# Patient Record
Sex: Male | Born: 1940 | Race: White | Hispanic: No | Marital: Married | Smoking: Former smoker
Health system: Southern US, Community
[De-identification: ages and names within clinical notes are randomized; demographics above are authoritative.]

## PROBLEM LIST (undated history)

## (undated) DIAGNOSIS — I739 Peripheral vascular disease, unspecified: Secondary | ICD-10-CM

## (undated) DIAGNOSIS — I499 Cardiac arrhythmia, unspecified: Secondary | ICD-10-CM

## (undated) DIAGNOSIS — I509 Heart failure, unspecified: Secondary | ICD-10-CM

## (undated) DIAGNOSIS — E785 Hyperlipidemia, unspecified: Secondary | ICD-10-CM

## (undated) DIAGNOSIS — R1310 Dysphagia, unspecified: Secondary | ICD-10-CM

## (undated) DIAGNOSIS — J9600 Acute respiratory failure, unspecified whether with hypoxia or hypercapnia: Secondary | ICD-10-CM

## (undated) DIAGNOSIS — Z8679 Personal history of other diseases of the circulatory system: Secondary | ICD-10-CM

## (undated) DIAGNOSIS — E039 Hypothyroidism, unspecified: Secondary | ICD-10-CM

## (undated) DIAGNOSIS — G4730 Sleep apnea, unspecified: Secondary | ICD-10-CM

## (undated) DIAGNOSIS — Z6841 Body Mass Index (BMI) 40.0 and over, adult: Secondary | ICD-10-CM

## (undated) DIAGNOSIS — R251 Tremor, unspecified: Secondary | ICD-10-CM

## (undated) DIAGNOSIS — E119 Type 2 diabetes mellitus without complications: Secondary | ICD-10-CM

## (undated) DIAGNOSIS — I429 Cardiomyopathy, unspecified: Secondary | ICD-10-CM

## (undated) DIAGNOSIS — Z87448 Personal history of other diseases of urinary system: Secondary | ICD-10-CM

## (undated) DIAGNOSIS — Z87442 Personal history of urinary calculi: Secondary | ICD-10-CM

## (undated) DIAGNOSIS — K219 Gastro-esophageal reflux disease without esophagitis: Secondary | ICD-10-CM

## (undated) DIAGNOSIS — I10 Essential (primary) hypertension: Secondary | ICD-10-CM

## (undated) DIAGNOSIS — G8191 Hemiplegia, unspecified affecting right dominant side: Secondary | ICD-10-CM

## (undated) DIAGNOSIS — G9340 Encephalopathy, unspecified: Secondary | ICD-10-CM

## (undated) DIAGNOSIS — I639 Cerebral infarction, unspecified: Secondary | ICD-10-CM

## (undated) DIAGNOSIS — E559 Vitamin D deficiency, unspecified: Secondary | ICD-10-CM

## (undated) HISTORY — DX: Essential (primary) hypertension: I10

## (undated) HISTORY — DX: Hyperlipidemia, unspecified: E78.5

## (undated) HISTORY — PX: COLONOSCOPY: SHX174

## (undated) HISTORY — PX: TONSILLECTOMY: SUR1361

## (undated) HISTORY — DX: Cerebral infarction, unspecified: I63.9

## (undated) HISTORY — DX: Type 2 diabetes mellitus without complications: E11.9

## (undated) HISTORY — PX: CATARACT EXTRACTION, BILATERAL: SHX1313

---
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
## 2004-08-02 ENCOUNTER — Ambulatory Visit: Payer: Self-pay | Admitting: Internal Medicine

## 2004-08-03 ENCOUNTER — Ambulatory Visit: Payer: Self-pay | Admitting: Internal Medicine

## 2004-11-30 ENCOUNTER — Ambulatory Visit: Payer: Self-pay | Admitting: Urology

## 2006-02-21 ENCOUNTER — Ambulatory Visit: Payer: Self-pay | Admitting: Unknown Physician Specialty

## 2007-09-16 ENCOUNTER — Ambulatory Visit: Payer: Self-pay | Admitting: Internal Medicine

## 2007-10-23 ENCOUNTER — Ambulatory Visit: Payer: Self-pay

## 2008-04-18 ENCOUNTER — Other Ambulatory Visit: Payer: Self-pay

## 2008-04-19 ENCOUNTER — Observation Stay: Payer: Self-pay | Admitting: Internal Medicine

## 2009-09-03 DIAGNOSIS — I639 Cerebral infarction, unspecified: Secondary | ICD-10-CM

## 2009-09-03 HISTORY — DX: Cerebral infarction, unspecified: I63.9

## 2011-04-30 ENCOUNTER — Ambulatory Visit: Payer: Self-pay | Admitting: Unknown Physician Specialty

## 2011-05-02 LAB — PATHOLOGY REPORT

## 2013-03-07 ENCOUNTER — Emergency Department: Payer: Self-pay | Admitting: Emergency Medicine

## 2013-03-07 LAB — COMPREHENSIVE METABOLIC PANEL
Albumin: 3.9 g/dL (ref 3.4–5.0)
Anion Gap: 10 (ref 7–16)
BUN: 18 mg/dL (ref 7–18)
Bilirubin,Total: 0.3 mg/dL (ref 0.2–1.0)
Calcium, Total: 9.4 mg/dL (ref 8.5–10.1)
Co2: 26 mmol/L (ref 21–32)
Creatinine: 1.06 mg/dL (ref 0.60–1.30)
EGFR (African American): >60
EGFR (Non-African Amer.): >60
Potassium: 3.6 mmol/L (ref 3.5–5.1)
SGOT(AST): 51 U/L — ABNORMAL HIGH (ref 15–37)
SGPT (ALT): 56 U/L (ref 12–78)
Sodium: 137 mmol/L (ref 136–145)

## 2013-03-07 LAB — URINALYSIS, COMPLETE
Bilirubin,UR: NEGATIVE
Ketone: NEGATIVE
Ph: 6 (ref 4.5–8.0)
RBC,UR: 1 /HPF (ref 0–5)
Squamous Epithelial: <1

## 2013-03-07 LAB — PROTIME-INR
INR: 1.1
Prothrombin Time: 14.1 secs (ref 11.5–14.7)

## 2013-03-07 LAB — CBC
HCT: 41.5 % (ref 40.0–52.0)
HGB: 14.2 g/dL (ref 13.0–18.0)
Platelet: 212 10*3/uL (ref 150–440)
RBC: 4.21 10*6/uL — ABNORMAL LOW (ref 4.40–5.90)
WBC: 8.4 10*3/uL (ref 3.8–10.6)

## 2014-03-24 ENCOUNTER — Ambulatory Visit: Payer: Self-pay | Admitting: Internal Medicine

## 2015-05-13 ENCOUNTER — Other Ambulatory Visit: Payer: Self-pay | Admitting: Internal Medicine

## 2015-05-13 DIAGNOSIS — I714 Abdominal aortic aneurysm, without rupture, unspecified: Secondary | ICD-10-CM

## 2015-05-17 ENCOUNTER — Ambulatory Visit
Admission: RE | Admit: 2015-05-17 | Discharge: 2015-05-17 | Disposition: A | Discharge status: Home / Self Care | Payer: Medicare Other | Source: Ambulatory Visit | Attending: Internal Medicine | Admitting: Internal Medicine

## 2015-05-17 DIAGNOSIS — I714 Abdominal aortic aneurysm, without rupture, unspecified: Secondary | ICD-10-CM

## 2015-09-04 DIAGNOSIS — Z87442 Personal history of urinary calculi: Secondary | ICD-10-CM

## 2015-09-04 HISTORY — DX: Personal history of urinary calculi: Z87.442

## 2016-02-24 ENCOUNTER — Other Ambulatory Visit: Payer: Self-pay | Admitting: Internal Medicine

## 2016-02-24 DIAGNOSIS — I714 Abdominal aortic aneurysm, without rupture, unspecified: Secondary | ICD-10-CM

## 2016-02-28 ENCOUNTER — Ambulatory Visit
Admission: RE | Admit: 2016-02-28 | Discharge: 2016-02-28 | Disposition: A | Discharge status: Home / Self Care | Payer: Medicare Other | Source: Ambulatory Visit | Attending: Internal Medicine | Admitting: Internal Medicine

## 2016-02-28 DIAGNOSIS — I714 Abdominal aortic aneurysm, without rupture, unspecified: Secondary | ICD-10-CM

## 2016-02-28 DIAGNOSIS — K760 Fatty (change of) liver, not elsewhere classified: Secondary | ICD-10-CM | POA: Diagnosis not present

## 2016-02-28 DIAGNOSIS — N281 Cyst of kidney, acquired: Secondary | ICD-10-CM | POA: Insufficient documentation

## 2016-08-13 DIAGNOSIS — N132 Hydronephrosis with renal and ureteral calculous obstruction: Secondary | ICD-10-CM | POA: Insufficient documentation

## 2017-03-31 DIAGNOSIS — M79606 Pain in leg, unspecified: Secondary | ICD-10-CM | POA: Insufficient documentation

## 2017-03-31 DIAGNOSIS — I890 Lymphedema, not elsewhere classified: Secondary | ICD-10-CM | POA: Insufficient documentation

## 2017-03-31 DIAGNOSIS — I872 Venous insufficiency (chronic) (peripheral): Secondary | ICD-10-CM | POA: Insufficient documentation

## 2017-03-31 NOTE — Progress Notes (Signed)
MRN : [id number]  Darren Frazier is a 76 y.o. (10-Jan-1941) male who presents with chief complaint of No chief complaint on file. [name]  History of Present Illness: The patient returns to the office for followup evaluation regarding leg swelling.  The swelling has improved quite a bit and the pain associated with swelling has decreased substantially. There have not been any interval development of a ulcerations or wounds.  Since the previous visit the patient has been wearing graduated compression stockings and has noted little significant improvement in the lymphedema. The patient has been using compression routinely morning until night.  The patient also states elevation during the day and exercise is being done too.     No outpatient prescriptions have been marked as taking for the 04/01/17 encounter (Appointment) with [name], [name], MD.    No past medical history on file.  No past surgical history on file.  Social History Social History  Substance Use Topics  . Smoking status: Not on file  . Smokeless tobacco: Not on file  . Alcohol use Not on file    Family History No family history on file.  Allergies not on file   REVIEW OF SYSTEMS (Negative unless checked)  Constitutional: [] Weight loss  [] Fever  [] Chills Cardiac: [] Chest pain   [] Chest pressure   [] Palpitations   [] Shortness of breath when laying flat   [] Shortness of breath with exertion. Vascular:  [] Pain in legs with walking   [x] Pain in legs with standing  [] History of DVT   [] Phlebitis   [x] Swelling in legs   [] Varicose veins   [] Non-healing ulcers Pulmonary:   [] Uses home oxygen   [] Productive cough   [] Hemoptysis   [] Wheeze  [] COPD   [] Asthma Neurologic:  [] Dizziness   [] Seizures   [] History of stroke   [] History of TIA  [] Aphasia   [] Vissual changes   [] Weakness or numbness in arm   [] Weakness or numbness in leg Musculoskeletal:   [] Joint swelling   [] Joint pain   [] Low back pain Hematologic:   [] Easy bruising  [] Easy bleeding   [] Hypercoagulable state   [] Anemic Gastrointestinal:  [] Diarrhea   [] Vomiting  [] Gastroesophageal reflux/heartburn   [] Difficulty swallowing. Genitourinary:  [] Chronic kidney disease   [] Difficult urination  [] Frequent urination   [] Blood in urine Skin:  [x] Rashes   [] Ulcers  Psychological:  [] History of anxiety   []  History of major depression.  Physical Examination  There were no vitals filed for this visit. There is no height or weight on file to calculate BMI. Gen: WD/WN, NAD Head: [place]/AT, No temporalis wasting.  Ear/Nose/Throat: Hearing grossly intact, nares w/o erythema or drainage Eyes: PER, EOMI, sclera nonicteric.  Neck: Supple, no large masses.   Pulmonary:  Good air movement, no audible wheezing bilaterally, no use of accessory muscles.  Cardiac: RRR, no JVD Vascular: moderate to severe venous stasis changes to the legs bilaterally.  3+ soft pitting edema Vessel Right Left  Radial Palpable Palpable  PT Palpable Palpable  DP Palpable Palpable  Gastrointestinal: Non-distended. No guarding/no peritoneal signs.  Musculoskeletal: M/S 5/5 throughout.  No deformity or atrophy.  Neurologic: CN 2-12 intact. Symmetrical.  Speech is fluent. Motor exam as listed above. Psychiatric: Judgment intact, Mood & affect appropriate for pt's clinical situation. Dermatologic: Venous rashes no ulcers noted.  No changes consistent with cellulitis. Lymph : No lichenification or skin changes of chronic lymphedema.  CBC Lab Results  Component Value Date   WBC 8.4 03/07/2013   HGB 14.2 03/07/2013  HCT 41.5 03/07/2013   MCV 99 03/07/2013   PLT 212 03/07/2013    BMET    Component Value Date/Time   NA 137 03/07/2013 1849   K 3.6 03/07/2013 1849   CL 101 03/07/2013 1849   CO2 26 03/07/2013 1849   GLUCOSE 135 (H) 03/07/2013 1849   BUN 18 03/07/2013 1849   CREATININE 1.06 03/07/2013 1849   CALCIUM 9.4 03/07/2013 1849   GFRNONAA >60 03/07/2013 1849    GFRAA >60 03/07/2013 1849   CrCl cannot be calculated (Patient's most recent lab result is older than the maximum 21 days allowed.).  COAG Lab Results  Component Value Date   INR 1.1 03/07/2013    Radiology No results found.  Assessment/Plan 1. Chronic venous insufficiency  No surgery or intervention at this point in time.    I have reviewed my discussion with the patient regarding lymphedema and why it  causes symptoms.  Patient will continue wearing graduated compression stockings class 1 (20-30 mmHg) on a daily basis a prescription was given. The patient is reminded to put the stockings on first thing in the morning and removing them in the evening. The patient is instructed specifically not to sleep in the stockings.   In addition, behavioral modification throughout the day will be continued.  This will include frequent elevation (such as in a recliner), use of over the counter pain medications as needed and exercise such as walking.  I have reviewed systemic causes for chronic edema such as liver, kidney and cardiac etiologies and there does not appear to be any significant changes in these organ systems over the past year.  The patient is under the impression that these organ systems are all stable and unchanged.    The patient will continue aggressive use of the  lymph pump.  This will continue to improve the edema control and prevent sequela such as ulcers and infections.   The patient will follow-up with me on an annual basis.    2. Lymphedema Patient notes that his machine has a crack in the casing although it is working this may represent an Lobbyist hazard he also notes that his leggings are 70+ years old and somewhat worn. We will contact [facility] to see if replacements can be obtained.  3. Pain in both lower extremities See #1 and 2    Levora Dredge, MD  03/31/2017 4:44 PM

## 2017-04-01 ENCOUNTER — Encounter (INDEPENDENT_AMBULATORY_CARE_PROVIDER_SITE_OTHER): Payer: Self-pay | Admitting: Vascular Surgery

## 2017-04-01 ENCOUNTER — Ambulatory Visit (INDEPENDENT_AMBULATORY_CARE_PROVIDER_SITE_OTHER): Payer: Medicare Other | Admitting: Vascular Surgery

## 2017-04-01 DIAGNOSIS — I890 Lymphedema, not elsewhere classified: Secondary | ICD-10-CM | POA: Diagnosis not present

## 2017-04-01 DIAGNOSIS — M79604 Pain in right leg: Secondary | ICD-10-CM | POA: Diagnosis not present

## 2017-04-01 DIAGNOSIS — M79605 Pain in left leg: Secondary | ICD-10-CM

## 2017-04-01 DIAGNOSIS — I872 Venous insufficiency (chronic) (peripheral): Secondary | ICD-10-CM | POA: Diagnosis not present

## 2017-09-03 DIAGNOSIS — I739 Peripheral vascular disease, unspecified: Secondary | ICD-10-CM

## 2017-09-03 DIAGNOSIS — Z6841 Body Mass Index (BMI) 40.0 and over, adult: Secondary | ICD-10-CM

## 2017-09-03 HISTORY — DX: Body Mass Index (BMI) 40.0 and over, adult: Z684

## 2017-09-03 HISTORY — DX: Morbid (severe) obesity due to excess calories: E66.01

## 2017-09-03 HISTORY — DX: Peripheral vascular disease, unspecified: I73.9

## 2018-02-19 ENCOUNTER — Other Ambulatory Visit (INDEPENDENT_AMBULATORY_CARE_PROVIDER_SITE_OTHER): Payer: Self-pay | Admitting: Vascular Surgery

## 2018-02-19 DIAGNOSIS — L98499 Non-pressure chronic ulcer of skin of other sites with unspecified severity: Secondary | ICD-10-CM

## 2018-02-19 DIAGNOSIS — R0989 Other specified symptoms and signs involving the circulatory and respiratory systems: Secondary | ICD-10-CM

## 2018-02-20 ENCOUNTER — Encounter (INDEPENDENT_AMBULATORY_CARE_PROVIDER_SITE_OTHER): Payer: Self-pay | Admitting: Vascular Surgery

## 2018-02-20 ENCOUNTER — Ambulatory Visit (INDEPENDENT_AMBULATORY_CARE_PROVIDER_SITE_OTHER): Payer: Medicare HMO

## 2018-02-20 ENCOUNTER — Ambulatory Visit (INDEPENDENT_AMBULATORY_CARE_PROVIDER_SITE_OTHER): Payer: Medicare HMO | Admitting: Vascular Surgery

## 2018-02-20 ENCOUNTER — Encounter (INDEPENDENT_AMBULATORY_CARE_PROVIDER_SITE_OTHER): Payer: Self-pay

## 2018-02-20 ENCOUNTER — Other Ambulatory Visit (INDEPENDENT_AMBULATORY_CARE_PROVIDER_SITE_OTHER): Payer: Self-pay

## 2018-02-20 VITALS — BP 139/73 | HR 91 | Resp 16 | Ht 69.0 in | Wt 295.0 lb

## 2018-02-20 DIAGNOSIS — E1159 Type 2 diabetes mellitus with other circulatory complications: Secondary | ICD-10-CM | POA: Diagnosis not present

## 2018-02-20 DIAGNOSIS — I10 Essential (primary) hypertension: Secondary | ICD-10-CM | POA: Diagnosis not present

## 2018-02-20 DIAGNOSIS — L98499 Non-pressure chronic ulcer of skin of other sites with unspecified severity: Secondary | ICD-10-CM

## 2018-02-20 DIAGNOSIS — R0989 Other specified symptoms and signs involving the circulatory and respiratory systems: Secondary | ICD-10-CM

## 2018-02-20 DIAGNOSIS — E119 Type 2 diabetes mellitus without complications: Secondary | ICD-10-CM | POA: Insufficient documentation

## 2018-02-20 DIAGNOSIS — I70209 Unspecified atherosclerosis of native arteries of extremities, unspecified extremity: Secondary | ICD-10-CM | POA: Insufficient documentation

## 2018-02-20 DIAGNOSIS — I70219 Atherosclerosis of native arteries of extremities with intermittent claudication, unspecified extremity: Secondary | ICD-10-CM | POA: Insufficient documentation

## 2018-02-20 DIAGNOSIS — I70245 Atherosclerosis of native arteries of left leg with ulceration of other part of foot: Secondary | ICD-10-CM | POA: Diagnosis not present

## 2018-02-20 NOTE — Progress Notes (Signed)
Subjective:    Patient ID: Darren Frazier, male    DOB: 06-13-1941, 77 y.o.   MRN: [id number] Chief Complaint  Patient presents with  . Follow-up    ref cline for abi   Presents at the request of Dr. Alberteen Spindle for evaluation of peripheral artery disease in the setting of a nonhealing ulceration.  The patient was last seen on April 01, 2017 in evaluation of bilateral lower extremity edema.  The patient has not been consistently engaging in conservative therapy including wearing medical grade one compression or elevating his legs on a daily basis however states that his "swelling" is "controlled".  The patient denies any recent bouts of cellulitis or ulcer formation/weeping to the bilateral calves.  The patient endorses a history of an ulceration noted to the left big toe for approximately 6 weeks.  The patient has been under the care of Dr. Alberteen Spindle receiving local wound care with minimal healing noted.  Dr. Dory Larsen referral note states poorly palpable pedal pulses.  The patient underwent an ABI which was notable for right: Common femoral artery - triphasic with monophasic popliteal, anterior tibial, and posterior tibial arteries.  Left: triphasic with monophasic popliteal, anterior tibial, and posterior tibial arteries.  The patient denies claudication-like symptoms or rest pain.  The patient denies any fever, nausea vomiting.  Review of Systems  Constitutional: Negative.   HENT: Negative.   Eyes: Negative.   Respiratory: Negative.   Cardiovascular: Positive for leg swelling.  Gastrointestinal: Negative.   Endocrine: Negative.   Genitourinary: Negative.   Musculoskeletal: Negative.   Skin: Positive for wound.  Allergic/Immunologic: Negative.   Neurological: Negative.   Hematological: Negative.   Psychiatric/Behavioral: Negative.       Objective:   Physical Exam  Constitutional: He is oriented to person, place, and time. He appears well-developed and well-nourished. No distress.  HENT:    Head: Normocephalic and atraumatic.  Right Ear: External ear normal.  Left Ear: External ear normal.  Eyes: Pupils are equal, round, and reactive to light. Conjunctivae and EOM are normal.  Neck: Normal range of motion.  Cardiovascular: Normal rate, regular rhythm, normal heart sounds and intact distal pulses.  Pulses:      Radial pulses are 2+ on the right side, and 2+ on the left side.  Hard to palpate pedal pulses however the bilateral feet are warm.  Pulmonary/Chest: Effort normal and breath sounds normal.  Musculoskeletal: Normal range of motion. He exhibits edema (Mild to moderate nonpitting edema noted bilaterally).  Neurological: He is alert and oriented to person, place, and time.  Skin: He is not diaphoretic.  Moderate stasis dermatitis noted to the bilateral calves.  Mild fibrosis noted to the bilateral calves.  There is no active cellulitis noted.  There is an ulcer with the fat layer exposed without any drainage or necrotic tissue to the left big toe.  Psychiatric: He has a normal mood and affect. His behavior is normal. Judgment and thought content normal.  Vitals reviewed.  BP 139/73 (BP Location: Right Arm)   Pulse 91   Resp 16   Ht 5\' 9"  (1.753 m)   Wt 295 lb (133.8 kg)   BMI 43.56 kg/m   Past Medical History:  Diagnosis Date  . Diabetes mellitus without complication (HCC)   . Hyperlipidemia   . Hypertension   . Stroke [facility])    Social History   Socioeconomic History  . Marital status: Married    Spouse name: Not on file  . Number  of children: Not on file  . Years of education: Not on file  . Highest education level: Not on file  Occupational History  . Not on file  Social Needs  . Financial resource strain: Not on file  . Food insecurity:    Worry: Not on file    Inability: Not on file  . Transportation needs:    Medical: Not on file    Non-medical: Not on file  Tobacco Use  . Smoking status: Former Games developer  . Smokeless tobacco: Never Used   Substance and Sexual Activity  . Alcohol use: No  . Drug use: No  . Sexual activity: Not on file  Lifestyle  . Physical activity:    Days per week: Not on file    Minutes per session: Not on file  . Stress: Not on file  Relationships  . Social connections:    Talks on phone: Not on file    Gets together: Not on file    Attends religious service: Not on file    Active member of club or organization: Not on file    Attends meetings of clubs or organizations: Not on file    Relationship status: Not on file  . Intimate partner violence:    Fear of current or ex partner: Not on file    Emotionally abused: Not on file    Physically abused: Not on file    Forced sexual activity: Not on file  Other Topics Concern  . Not on file  Social History Narrative  . Not on file   Past Surgical History:  Procedure Laterality Date  . APPENDECTOMY    . CATARACT EXTRACTION, BILATERAL    . TONSILLECTOMY     Family History  Problem Relation Age of Onset  . Diabetes Father    Allergies  Allergen Reactions  . Ace Inhibitors Other (See Comments)  . Omeprazole Diarrhea  . Pravastatin Other (See Comments)      Assessment & Plan:  Presents at the request of Dr. Alberteen Spindle for evaluation of peripheral artery disease in the setting of a nonhealing ulceration.  The patient was last seen on April 01, 2017 in evaluation of bilateral lower extremity edema.  The patient has not been consistently engaging in conservative therapy including wearing medical grade one compression or elevating his legs on a daily basis however states that his "swelling" is "controlled".  The patient denies any recent bouts of cellulitis or ulcer formation/weeping to the bilateral calves.  The patient endorses a history of an ulceration noted to the left big toe for approximately 6 weeks.  The patient has been under the care of Dr. Alberteen Spindle receiving local wound care with minimal healing noted.  Dr. Dory Larsen referral note states poorly  palpable pedal pulses.  The patient underwent an ABI which was notable for right: Common femoral artery - triphasic with monophasic popliteal, anterior tibial, and posterior tibial arteries.  Left: triphasic with monophasic popliteal, anterior tibial, and posterior tibial arteries.  The patient denies claudication-like symptoms or rest pain.  The patient denies any fever, nausea vomiting.  1. 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 The patient has a nonhealing ulceration to the left big toe. Recommend a left lower extremity angiogram with possible intervention in an attempt to assess the patient's anatomy and contributing degree of peripheral artery disease.  If  appropriate an attempt to revascularize the extremity can be made at that time. Procedure, risks and benefits explained to the patient All questions answered The patient wishes to proceed I have discussed with the patient at length the risk factors for and pathogenesis of atherosclerotic disease and encouraged a healthy diet, regular exercise regimen and blood pressure / glucose control.  The patient was encouraged to call the office in the interim if he experiences any claudication like symptoms, rest pain or ulcers to his feet / toes.  2. 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 amLODipine (NORVASC) 10 MG tablet     . bisoprolol (ZEBETA) 10 MG tablet Take by mouth.    . Calcium Ascorbate 500 MG TABS Vitamin C 500 mg  tablet  Take 1 tablet every day by oral route.    . Cholecalciferol (VITAMIN D3) 1000 units CAPS Take by mouth.    . dabigatran (PRADAXA) 150 MG CAPS capsule Take by mouth.    . doxazosin (CARDURA) 2 MG tablet     . fluticasone (FLONASE) 50 MCG/ACT nasal spray 2 sprays by Each Nare route daily as needed. Frequency:UNKNOWN   Dosage:50   MCG  Instructions:  Note:Dose:    . gabapentin (NEURONTIN) 300 MG capsule     . hydrochlorothiazide (HYDRODIURIL) 25 MG tablet Take 25 mg by mouth.    . levothyroxine (SYNTHROID, LEVOTHROID) 88 MCG tablet     . losartan (COZAAR) 100 MG tablet Take 100 mg by mouth.    . meloxicam (MOBIC) 15 MG tablet Take 15 mg by mouth.    . metFORMIN (GLUCOPHAGE) 1000 MG tablet     . Multiple Vitamin (MULTIVITAMIN) capsule Take by mouth.    . Omega-3 Fatty Acids (FISH OIL PO) Take by mouth.    . ranitidine (ZANTAC) 150 MG tablet      No current facility-administered medications on file prior to visit.    There are no Patient Instructions on file for this visit. No follow-ups on file.  KIMBERLY A STEGMAYER, PA-C

## 2018-02-27 ENCOUNTER — Other Ambulatory Visit (INDEPENDENT_AMBULATORY_CARE_PROVIDER_SITE_OTHER): Payer: Self-pay

## 2018-03-03 ENCOUNTER — Other Ambulatory Visit: Payer: Self-pay

## 2018-03-03 ENCOUNTER — Encounter
Admission: RE | Admit: 2018-03-03 | Discharge: 2018-03-03 | Disposition: A | Discharge status: Home / Self Care | Payer: Medicare HMO | Source: Ambulatory Visit | Attending: Vascular Surgery | Admitting: Vascular Surgery

## 2018-03-03 DIAGNOSIS — Z9889 Other specified postprocedural states: Secondary | ICD-10-CM | POA: Diagnosis not present

## 2018-03-03 DIAGNOSIS — Z8673 Personal history of transient ischemic attack (TIA), and cerebral infarction without residual deficits: Secondary | ICD-10-CM | POA: Diagnosis not present

## 2018-03-03 DIAGNOSIS — Z87891 Personal history of nicotine dependence: Secondary | ICD-10-CM | POA: Diagnosis not present

## 2018-03-03 DIAGNOSIS — E11621 Type 2 diabetes mellitus with foot ulcer: Secondary | ICD-10-CM | POA: Diagnosis not present

## 2018-03-03 DIAGNOSIS — I70222 Atherosclerosis of native arteries of extremities with rest pain, left leg: Secondary | ICD-10-CM | POA: Diagnosis present

## 2018-03-03 DIAGNOSIS — Z888 Allergy status to other drugs, medicaments and biological substances status: Secondary | ICD-10-CM | POA: Diagnosis not present

## 2018-03-03 DIAGNOSIS — I10 Essential (primary) hypertension: Secondary | ICD-10-CM | POA: Diagnosis not present

## 2018-03-03 DIAGNOSIS — Z79899 Other long term (current) drug therapy: Secondary | ICD-10-CM | POA: Diagnosis not present

## 2018-03-03 DIAGNOSIS — E1159 Type 2 diabetes mellitus with other circulatory complications: Secondary | ICD-10-CM | POA: Diagnosis not present

## 2018-03-03 DIAGNOSIS — Z9841 Cataract extraction status, right eye: Secondary | ICD-10-CM | POA: Diagnosis not present

## 2018-03-03 DIAGNOSIS — Z833 Family history of diabetes mellitus: Secondary | ICD-10-CM | POA: Diagnosis not present

## 2018-03-03 DIAGNOSIS — Z9842 Cataract extraction status, left eye: Secondary | ICD-10-CM | POA: Diagnosis not present

## 2018-03-03 DIAGNOSIS — L97522 Non-pressure chronic ulcer of other part of left foot with fat layer exposed: Secondary | ICD-10-CM | POA: Diagnosis not present

## 2018-03-03 DIAGNOSIS — Z7984 Long term (current) use of oral hypoglycemic drugs: Secondary | ICD-10-CM | POA: Diagnosis not present

## 2018-03-03 DIAGNOSIS — E785 Hyperlipidemia, unspecified: Secondary | ICD-10-CM | POA: Diagnosis not present

## 2018-03-03 DIAGNOSIS — Z79890 Hormone replacement therapy (postmenopausal): Secondary | ICD-10-CM | POA: Diagnosis not present

## 2018-03-03 HISTORY — DX: Cardiomyopathy, unspecified: I42.9

## 2018-03-03 HISTORY — DX: Gastro-esophageal reflux disease without esophagitis: K21.9

## 2018-03-03 HISTORY — DX: Hypothyroidism, unspecified: E03.9

## 2018-03-03 HISTORY — DX: Personal history of other diseases of the circulatory system: Z86.79

## 2018-03-03 HISTORY — DX: Morbid (severe) obesity due to excess calories: E66.01

## 2018-03-03 HISTORY — DX: Personal history of other diseases of urinary system: Z87.448

## 2018-03-03 HISTORY — DX: Cardiac arrhythmia, unspecified: I49.9

## 2018-03-03 HISTORY — DX: Peripheral vascular disease, unspecified: I73.9

## 2018-03-03 HISTORY — DX: Vitamin D deficiency, unspecified: E55.9

## 2018-03-03 HISTORY — DX: Body Mass Index (BMI) 40.0 and over, adult: Z684

## 2018-03-03 HISTORY — DX: Personal history of urinary calculi: Z87.442

## 2018-03-03 HISTORY — DX: Tremor, unspecified: R25.1

## 2018-03-03 LAB — CREATININE, SERUM
CREATININE: 0.85 mg/dL (ref 0.61–1.24)
GFR calc non Af Amer: >60 mL/min (ref >60)

## 2018-03-03 LAB — BUN: BUN: 16 mg/dL (ref 8–23)

## 2018-03-03 LAB — POTASSIUM: Potassium: 3.8 mmol/L (ref 3.5–5.1)

## 2018-03-04 MED ORDER — CEFAZOLIN SODIUM-DEXTROSE 2-4 GM/100ML-% IV SOLN
2.0000 g | Freq: Once | INTRAVENOUS | Status: AC
  Administered 2018-03-05: 2 g via INTRAVENOUS

## 2018-03-05 ENCOUNTER — Encounter
Admission: RE | Disposition: A | Discharge status: Home / Self Care | Payer: Self-pay | Source: Ambulatory Visit | Attending: Vascular Surgery

## 2018-03-05 ENCOUNTER — Ambulatory Visit
Admission: RE | Admit: 2018-03-05 | Discharge: 2018-03-05 | Disposition: A | Discharge status: Home / Self Care | Payer: Medicare HMO | Source: Ambulatory Visit | Attending: Vascular Surgery | Admitting: Vascular Surgery

## 2018-03-05 DIAGNOSIS — E11621 Type 2 diabetes mellitus with foot ulcer: Secondary | ICD-10-CM | POA: Insufficient documentation

## 2018-03-05 DIAGNOSIS — Z7984 Long term (current) use of oral hypoglycemic drugs: Secondary | ICD-10-CM | POA: Insufficient documentation

## 2018-03-05 DIAGNOSIS — Z8673 Personal history of transient ischemic attack (TIA), and cerebral infarction without residual deficits: Secondary | ICD-10-CM | POA: Insufficient documentation

## 2018-03-05 DIAGNOSIS — E785 Hyperlipidemia, unspecified: Secondary | ICD-10-CM | POA: Insufficient documentation

## 2018-03-05 DIAGNOSIS — Z87891 Personal history of nicotine dependence: Secondary | ICD-10-CM | POA: Insufficient documentation

## 2018-03-05 DIAGNOSIS — E1159 Type 2 diabetes mellitus with other circulatory complications: Secondary | ICD-10-CM | POA: Insufficient documentation

## 2018-03-05 DIAGNOSIS — L97909 Non-pressure chronic ulcer of unspecified part of unspecified lower leg with unspecified severity: Secondary | ICD-10-CM

## 2018-03-05 DIAGNOSIS — Z79890 Hormone replacement therapy (postmenopausal): Secondary | ICD-10-CM | POA: Insufficient documentation

## 2018-03-05 DIAGNOSIS — L97522 Non-pressure chronic ulcer of other part of left foot with fat layer exposed: Secondary | ICD-10-CM | POA: Insufficient documentation

## 2018-03-05 DIAGNOSIS — I70212 Atherosclerosis of native arteries of extremities with intermittent claudication, left leg: Secondary | ICD-10-CM | POA: Diagnosis not present

## 2018-03-05 DIAGNOSIS — Z79899 Other long term (current) drug therapy: Secondary | ICD-10-CM | POA: Insufficient documentation

## 2018-03-05 DIAGNOSIS — Z9889 Other specified postprocedural states: Secondary | ICD-10-CM | POA: Insufficient documentation

## 2018-03-05 DIAGNOSIS — I70222 Atherosclerosis of native arteries of extremities with rest pain, left leg: Secondary | ICD-10-CM | POA: Insufficient documentation

## 2018-03-05 DIAGNOSIS — Z9841 Cataract extraction status, right eye: Secondary | ICD-10-CM | POA: Insufficient documentation

## 2018-03-05 DIAGNOSIS — Z888 Allergy status to other drugs, medicaments and biological substances status: Secondary | ICD-10-CM | POA: Insufficient documentation

## 2018-03-05 DIAGNOSIS — I10 Essential (primary) hypertension: Secondary | ICD-10-CM | POA: Insufficient documentation

## 2018-03-05 DIAGNOSIS — Z833 Family history of diabetes mellitus: Secondary | ICD-10-CM | POA: Insufficient documentation

## 2018-03-05 DIAGNOSIS — Z9842 Cataract extraction status, left eye: Secondary | ICD-10-CM | POA: Insufficient documentation

## 2018-03-05 DIAGNOSIS — I70299 Other atherosclerosis of native arteries of extremities, unspecified extremity: Secondary | ICD-10-CM

## 2018-03-05 HISTORY — PX: LOWER EXTREMITY ANGIOGRAPHY: CATH118251

## 2018-03-05 LAB — GLUCOSE, CAPILLARY
GLUCOSE-CAPILLARY: 162 mg/dL — AB (ref 70–99)
Glucose-Capillary: 165 mg/dL — ABNORMAL HIGH (ref 70–99)

## 2018-03-05 SURGERY — LOWER EXTREMITY ANGIOGRAPHY
Anesthesia: Moderate Sedation | Laterality: Left

## 2018-03-05 MED ORDER — SODIUM CHLORIDE 0.9% FLUSH: 3.0000 mL | Freq: Two times a day (BID) | INTRAVENOUS | Status: DC

## 2018-03-05 MED ORDER — ONDANSETRON HCL 4 MG/2ML IJ SOLN: 4.0000 mg | Freq: Four times a day (QID) | INTRAMUSCULAR | Status: DC | PRN

## 2018-03-05 MED ORDER — HEPARIN SODIUM (PORCINE) 1000 UNIT/ML IJ SOLN
INTRAMUSCULAR | Status: AC
  Filled 2018-03-05: qty 1

## 2018-03-05 MED ORDER — MIDAZOLAM HCL 2 MG/2ML IJ SOLN
INTRAMUSCULAR | Status: DC | PRN
  Administered 2018-03-05: 2 mg via INTRAVENOUS
  Administered 2018-03-05: 1 mg via INTRAVENOUS

## 2018-03-05 MED ORDER — MORPHINE SULFATE (PF) 4 MG/ML IV SOLN: 2.0000 mg | INTRAVENOUS | Status: DC | PRN

## 2018-03-05 MED ORDER — ASPIRIN EC 325 MG PO TBEC
325.0000 mg | DELAYED_RELEASE_TABLET | Freq: Once | ORAL | Status: AC
  Administered 2018-03-05: 325 mg via ORAL

## 2018-03-05 MED ORDER — SODIUM CHLORIDE 0.9 % IV SOLN: INTRAVENOUS | Status: DC

## 2018-03-05 MED ORDER — SODIUM CHLORIDE 0.9% FLUSH: 3.0000 mL | INTRAVENOUS | Status: DC | PRN

## 2018-03-05 MED ORDER — FENTANYL CITRATE (PF) 100 MCG/2ML IJ SOLN
INTRAMUSCULAR | Status: DC | PRN
  Administered 2018-03-05: 50 ug via INTRAVENOUS
  Administered 2018-03-05: 25 ug via INTRAVENOUS

## 2018-03-05 MED ORDER — IOPAMIDOL (ISOVUE-300) INJECTION 61%
INTRAVENOUS | Status: DC | PRN
  Administered 2018-03-05: 75 mL via INTRA_ARTERIAL

## 2018-03-05 MED ORDER — SODIUM CHLORIDE 0.9 % IV SOLN: 250.0000 mL | INTRAVENOUS | Status: DC | PRN

## 2018-03-05 MED ORDER — ACETAMINOPHEN 325 MG PO TABS: 650.0000 mg | ORAL_TABLET | ORAL | Status: DC | PRN

## 2018-03-05 MED ORDER — MIDAZOLAM HCL 5 MG/5ML IJ SOLN
INTRAMUSCULAR | Status: AC
  Filled 2018-03-05: qty 5

## 2018-03-05 MED ORDER — SODIUM CHLORIDE 0.9 % IV SOLN
INTRAVENOUS | Status: DC
  Administered 2018-03-05: 08:00:00 via INTRAVENOUS

## 2018-03-05 MED ORDER — LIDOCAINE HCL (PF) 1 % IJ SOLN
INTRAMUSCULAR | Status: AC
  Filled 2018-03-05: qty 30

## 2018-03-05 MED ORDER — HYDRALAZINE HCL 20 MG/ML IJ SOLN: 5.0000 mg | INTRAMUSCULAR | Status: DC | PRN

## 2018-03-05 MED ORDER — LABETALOL HCL 5 MG/ML IV SOLN: 10.0000 mg | INTRAVENOUS | Status: DC | PRN

## 2018-03-05 MED ORDER — ASPIRIN EC 325 MG PO TBEC
DELAYED_RELEASE_TABLET | ORAL | Status: AC
  Filled 2018-03-05: qty 1

## 2018-03-05 MED ORDER — HEPARIN SODIUM (PORCINE) 1000 UNIT/ML IJ SOLN
INTRAMUSCULAR | Status: DC | PRN
  Administered 2018-03-05: 4000 [IU] via INTRAVENOUS

## 2018-03-05 MED ORDER — OXYCODONE HCL 5 MG PO TABS: 5.0000 mg | ORAL_TABLET | ORAL | Status: DC | PRN

## 2018-03-05 MED ORDER — HEPARIN (PORCINE) IN NACL 1000-0.9 UT/500ML-% IV SOLN
INTRAVENOUS | Status: AC
  Filled 2018-03-05: qty 1000

## 2018-03-05 MED ORDER — FENTANYL CITRATE (PF) 100 MCG/2ML IJ SOLN
INTRAMUSCULAR | Status: AC
  Filled 2018-03-05: qty 4

## 2018-03-05 SURGICAL SUPPLY — 27 items
BALLN LUTONIX  018 4X60X130 (BALLOONS) ×2
BALLN LUTONIX 018 4X60X130 (BALLOONS) ×1
BALLN LUTONIX DCB 6X100X130 (BALLOONS) ×3
BALLN ULTRASCORE 014 3X40X150 (BALLOONS) ×3
BALLN ULTRASCORE 5X100X130 (BALLOONS) ×3
BALLOON LUTONIX 018 4X60X130 (BALLOONS) ×1 IMPLANT
BALLOON LUTONIX DCB 6X100X130 (BALLOONS) ×1 IMPLANT
BALLOON ULTRASCORE 5X100X130 (BALLOONS) ×1 IMPLANT
BALLOON ULTRSCRE 014 3X40X150 (BALLOONS) ×1 IMPLANT
CANNULA 5F STIFF (CANNULA) ×3 IMPLANT
CATH PIG 70CM (CATHETERS) ×3 IMPLANT
CATH VERT 5FR 125CM (CATHETERS) ×3 IMPLANT
DEVICE PRESTO INFLATION (MISCELLANEOUS) ×3 IMPLANT
DEVICE STARCLOSE SE CLOSURE (Vascular Products) ×3 IMPLANT
DEVICE TORQUE .025-.038 (MISCELLANEOUS) ×3 IMPLANT
GLIDEWIRE ADV .014X300CM (WIRE) ×3 IMPLANT
GUIDEWIRE SUPER STIFF .035X180 (WIRE) ×3 IMPLANT
NEEDLE ENTRY 21GA 7CM ECHOTIP (NEEDLE) ×3 IMPLANT
PACK ANGIOGRAPHY (CUSTOM PROCEDURE TRAY) ×3 IMPLANT
SET INTRO CAPELLA COAXIAL (SET/KITS/TRAYS/PACK) ×3 IMPLANT
SHEATH BRITE TIP 5FRX11 (SHEATH) ×3 IMPLANT
SHEATH RAABE 6FR (SHEATH) ×3 IMPLANT
SYR MEDRAD MARK V 150ML (SYRINGE) ×3 IMPLANT
TUBING CONTRAST HIGH PRESS 72 (TUBING) ×3 IMPLANT
WIRE AQUATRACK .035X260CM (WIRE) ×3 IMPLANT
WIRE J 3MM .035X145CM (WIRE) ×3 IMPLANT
WIRE MAGIC TORQUE 260C (WIRE) ×3 IMPLANT

## 2018-03-05 NOTE — H&P (Signed)
[facility] History & Physical Update  The patient was interviewed and re-examined.  The patient's previous History and Physical has been reviewed and is unchanged.  There is no change in the plan of care. We plan to proceed with the scheduled procedure.  Levora Dredge, MD  03/05/2018, 7:59 AM

## 2018-03-05 NOTE — Op Note (Signed)
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 Introduction catheter into left lower extremity 3rd order catheter placement  2. Contrast injection left lower extremity for distal runoff  3. Percutaneous transluminal angioplasty left superficial femoral and popliteal arteries to 6 mm with a Lutonix drug-eluting balloon 4. Percutaneous transluminal angioplasty tibial peroneal trunk and peroneal 2 4 mm with a Lutonix drug-eluting balloon  5. Star close closure right common femoral arteriotomy  Anesthesia: Conscious sedation was administered under my direct supervision by the interventional radiology RN. IV Versed plus fentanyl were utilized. Continuous ECG, pulse oximetry and blood pressure was monitored throughout the entire procedure.  Conscious sedation was for a total of 1 hour 17 minutes.  Sheath: 6 French Raby  Contrast: 75 cc  Fluoroscopy Time: 8.6 minutes  Indications: Tammy Sours presents with increasing pain of the left lower extremity.  Patient has developed an ulcer of the left foot which Dr. Caryl Comes is following.  This suggests the patient is having limb threatening ischemia. The risks and benefits are reviewed all questions answered patient agrees to proceed.  Procedure:Giancarlos Adelfo Diebel is a 77 y.o. y.o. male who was identified and appropriate procedural time out was performed. The patient was then placed supine on the table and prepped and draped in the usual sterile fashion.   Ultrasound was placed in the sterile sleeve and the right groin was evaluated the right common femoral artery was echolucent and pulsatile indicating  patency. Image was recorded for the permanent record and under real-time visualization a microneedle was inserted into the common femoral artery followed by the microwire and then the micro-sheath. A J-wire was then advanced through the micro-sheath and a 5 [place] sheath was then inserted over a J-wire. J-wire was then advanced and a 5 French pigtail catheter was positioned at the level of T12.  AP projection of the aorta was then obtained. Pigtail catheter was repositioned to above the bifurcation and a RAO view of the pelvis was obtained. Subsequently a pigtail catheter with the stiff angle Glidewire was used to cross the aortic bifurcation the catheter wire were advanced down into the left distal external iliac artery. Oblique view of the femoral bifurcation was then obtained and subsequently the wire was reintroduced and the pigtail catheter negotiated into the SFA representing third order catheter placement. Distal runoff was then performed.  5000 units of heparin was then given and allowed to circulate and a 6 [place] Raby sheath was advanced up and over the bifurcation and positioned in the femoral artery  Straight catheter and stiff angle Glidewire were then negotiated down into the distal popliteal. Catheter was then advanced. Hand injection contrast demonstrated the tibial anatomy in detail.  Within the SFA at the level of Hunter's canal extending into the above-knee popliteal there are tandem lesions one is greater than 95% the other is greater than 70%.  A 5 mm x 100 mm ultra score balloon was used to angioplasty the SFA and above-knee popliteal.  Following this, a 6 mm x 100 mm Lutonix drug-eluting balloon was used to angioplasty the same location.  Each inflation was for 2 minutes at 12 atm. Follow-up imaging demonstrated excellent patency with less than 5% residual stenosis.  The detector was then repositioned and the tibioperoneal trunk and peroneal artery was reimaged.  Multiple lesions  are noted throughout the tibioperoneal trunk and proximal peroneal these represent stenoses of 75 to 90%.  After appropriate measurements are made a 3 mm x 40 mm ultra score balloon is advanced across the lesions.  It is inflated to 12 atm for 1 minute.  Following this, a 4 millimeter x 60 mm Lutonix balloon was used to angioplasty the proximal peroneal and tibioperoneal trunk arteries. Inflation were to 12 atmospheres for 2 minutes. Follow-up imaging demonstrated patency with excellent result and less than 5% residual stenosis. Distal runoff was then reassessed and noted to be widely patent.   After review of these images the sheath is pulled into the right external iliac oblique of the common femoral is obtained and a Star close device deployed. There no immediate Complications.  Findings: The abdominal aorta is opacified with a bolus injection contrast. Renal arteries are single and patent bilaterally. The aorta itself has diffuse disease but no hemodynamically significant lesions. The common and external iliac arteries are widely patent bilaterally.  The left common femoral is widely patent as is the profunda femoris.  The SFA does indeed have a significant stenosis at the level of Hunter's canal extending into the above-knee popliteal as described above with essentially a string sign associated with a high-grade lesion several centimeters more distal.  The distal popliteal demonstrates diffuse nonhemodynamically limiting disease and the trifurcation is heavily diseased with occlusion of the anterior tibial and posterior tibial arteries from their origins down to the ankle.  The tibioperoneal trunk and proximal peroneal demonstrates multiple greater than 70% lesions with a subtotal occlusion in its proximal portion.  Following angioplasty the tibioperoneal trunk and peroneal now is widely patent in-line flow and looks quite nice.  It is the single vessel runoff to the foot and distally the peroneal  collateralizes with the dorsalis pedis filling the pedal arch.  Angioplasty of the SFA at Hunter's canal yields an excellent result with less than 5% residual stenosis.  Summary: Successful recanalization left lower extremity for limb salvage   Disposition: Patient was taken to the recovery room in stable condition having tolerated the procedure well.  Belenda Cruise Dennie Moltz 03/05/2018,9:31 AM

## 2018-03-20 ENCOUNTER — Encounter (INDEPENDENT_AMBULATORY_CARE_PROVIDER_SITE_OTHER): Payer: Self-pay | Admitting: Vascular Surgery

## 2018-03-20 ENCOUNTER — Ambulatory Visit (INDEPENDENT_AMBULATORY_CARE_PROVIDER_SITE_OTHER): Payer: Medicare HMO | Admitting: Vascular Surgery

## 2018-03-20 VITALS — BP 140/76 | HR 90 | Resp 18 | Ht 69.0 in | Wt 305.0 lb

## 2018-03-20 DIAGNOSIS — I890 Lymphedema, not elsewhere classified: Secondary | ICD-10-CM

## 2018-03-20 DIAGNOSIS — I10 Essential (primary) hypertension: Secondary | ICD-10-CM

## 2018-03-20 DIAGNOSIS — I70245 Atherosclerosis of native arteries of left leg with ulceration of other part of foot: Secondary | ICD-10-CM | POA: Diagnosis not present

## 2018-03-20 DIAGNOSIS — I872 Venous insufficiency (chronic) (peripheral): Secondary | ICD-10-CM | POA: Diagnosis not present

## 2018-03-20 NOTE — Progress Notes (Signed)
MRN : [id number]  Darren Frazier is a 77 y.o. ([birth year]) male who presents with chief complaint of  Chief Complaint  Patient presents with  . Follow-up    2 week [facility] f/u  .  History of Present Illness:   The patient returns to the office for followup and review status post angiogram with intervention.   Procedure(s) Performed 03/05/2018: 1. Introduction catheter into left lower extremity 3rd order catheter placement  2. Contrast injection left lower extremity for distal runoff  3. Percutaneous transluminal angioplasty left superficial femoral and popliteal arteries to 6 mm with a [employer] drug-eluting balloon 4. Percutaneous transluminal angioplasty tibial peroneal trunk and peroneal 2 4 mm with a [employer] drug-eluting balloon  5. Star close closure right common femoral arteriotomy  The patient notes improvement in the lower extremity symptoms. No interval shortening of the patient's claudication distance or rest pain symptoms. Previous wounds are improved and Podiatry is stating things are better.  No new ulcers or wounds have occurred since the last visit.  The patient is also here for followup evaluation regarding leg swelling.  The swelling has persisted but with the lymph pump is much, much better controlled. The pain associated with swelling is essentially eliminated. There have not been any interval development of a ulcerations or wounds.  The patient denies problems with the pump, noting it is working well and the leggings are in good condition.  Since the previous visit the patient has been wearing graduated compression stockings and using the lymph pump on a routine basis and  has noted significant improvement in the lymphedema.   Patient stated the lymph pump has been a very positive factor in her care.   There have been no significant changes to the patient's overall health care.  The patient denies  amaurosis fugax or recent TIA symptoms. There are no recent neurological changes noted. The patient denies history of DVT, PE or superficial thrombophlebitis. The patient denies recent episodes of angina or shortness of breath.     Current Meds  Medication Sig  . amLODipine (NORVASC) 10 MG tablet Take 10 mg by mouth daily.   [name] aspirin 81 MG chewable tablet Chew by mouth daily.  . bisoprolol (ZEBETA) 10 MG tablet Take 10 mg by mouth daily.   . Calcium Citrate (CITRACAL PO) Take 1 tablet by mouth 2 (two) times daily.  . Cholecalciferol (VITAMIN D3) 1000 units CAPS Take 1,000 Units by mouth daily.   . dabigatran (PRADAXA) 150 MG CAPS capsule Take 150 mg by mouth 2 (two) times daily.   [name] doxazosin (CARDURA) 2 MG tablet Take 2 mg by mouth daily.   . fluticasone (FLONASE) 50 MCG/ACT nasal spray 2 sprays in each twice daily as needed  . gabapentin (NEURONTIN) 100 MG capsule Take 200 mg by mouth 2 (two) times daily.   . hydrochlorothiazide (HYDRODIURIL) 25 MG tablet Take 25 mg by mouth daily.   [name] levothyroxine (SYNTHROID, LEVOTHROID) 88 MCG tablet Take 88 mcg by mouth daily before breakfast.   . losartan (COZAAR) 100 MG tablet Take 100 mg by mouth daily.   . meloxicam (MOBIC) 15 MG tablet Take 15 mg by mouth daily.   . metFORMIN (GLUCOPHAGE) 1000 MG tablet Take 1,000 mg by mouth 2 (two) times daily with a meal.   . Multiple Vitamin (MULTIVITAMIN) capsule Take 1 capsule by mouth daily.   . Omega-3 Fatty Acids (FISH OIL) 1200 MG CAPS Take 1,200 mg by mouth 3 (three) times daily.   [name]  ranitidine (ZANTAC) 150 MG tablet Take 150 mg by mouth 2 (two) times daily.   . vitamin C (ASCORBIC ACID) 500 MG tablet Take 500 mg by mouth daily.    Past Medical History:  Diagnosis Date  . Cardiomyopathy (HCC)   . Diabetes mellitus without complication (HCC)   . Dysrhythmia    atrial fibrillation  . GERD (gastroesophageal reflux disease)   . H/O: urethral stricture    self caths occasionally  . History of  abdominal aortic aneurysm (AAA)    30/11mm in diameter, stable  . History of kidney stones 2017  . Hyperlipidemia   . Hypertension   . Hypothyroidism   . Morbid obesity with BMI of 40.0-44.9, adult (HCC) 2019  . Peripheral vascular disease (HCC) 2019   lymphadema both extremities, ulceration on left big toe  . Stroke (HCC) 2011   x 2 within 6 months. some numbness over upper arms, balance off  . Tremors of nervous system   . Vitamin D deficiency     Past Surgical History:  Procedure Laterality Date  . APPENDECTOMY  1961  . CATARACT EXTRACTION, BILATERAL Bilateral   . COLONOSCOPY    . LOWER EXTREMITY ANGIOGRAPHY Left 03/05/2018   Procedure: LOWER EXTREMITY ANGIOGRAPHY;  Surgeon: Renford Dills, MD;  Location: [facility]IVE [facility];  Service: Cardiovascular;  Laterality: Left;  . TONSILLECTOMY      Social History Social History   Tobacco Use  . Smoking status: Former Smoker    Packs/day: 4.00    Years: 30.00    Pack years: 120.00    Types: Cigarettes    Last attempt to quit: 1984    Years since quitting: 35.5  . Smokeless tobacco: Never Used  Substance Use Topics  . Alcohol use: No    Comment: occasional beer. couple years ago  . Drug use: No    Family History Family History  Problem Relation Age of Onset  . Diabetes Father     Allergies  Allergen Reactions  . Ace Inhibitors Other (See Comments)    unknown  . Omeprazole Diarrhea  . Statins Other (See Comments)    Abnormal lab results(pt unsure of what labs)     REVIEW OF SYSTEMS (Negative unless checked)  Constitutional: [] Weight loss  [] Fever  [] Chills Cardiac: [] Chest pain   [] Chest pressure   [] Palpitations   [] Shortness of breath when laying flat   [] Shortness of breath with exertion. Vascular:  [] Pain in legs with walking   [] Pain in legs at rest  [] History of DVT   [] Phlebitis   [x] Swelling in legs   [] Varicose veins   [x] Non-healing ulcers Pulmonary:   [] Uses home oxygen   [] Productive cough    [] Hemoptysis   [] Wheeze  [] COPD   [] Asthma Neurologic:  [] Dizziness   [] Seizures   [] History of stroke   [] History of TIA  [] Aphasia   [] Vissual changes   [] Weakness or numbness in arm   [] Weakness or numbness in leg Musculoskeletal:   [] Joint swelling   [] Joint pain   [] Low back pain Hematologic:  [] Easy bruising  [] Easy bleeding   [] Hypercoagulable state   [] Anemic Gastrointestinal:  [] Diarrhea   [] Vomiting  [] Gastroesophageal reflux/heartburn   [] Difficulty swallowing. Genitourinary:  [] Chronic kidney disease   [] Difficult urination  [] Frequent urination   [] Blood in urine Skin:  [x] Rashes   [] Ulcers  Psychological:  [] History of anxiety   []  History of major depression.  Physical Examination  Vitals:   03/20/18 0840  BP: 140/76  Pulse:  90  Resp: 18  Weight: (!) 305 lb (138.3 kg)  Height: 5\' 9"  (1.753 m)   Body mass index is 45.04 kg/m. Gen: WD/WN, NAD Head: [place]/AT, No temporalis wasting.  Ear/Nose/Throat: Hearing grossly intact, nares w/o erythema or drainage Eyes: PER, EOMI, sclera nonicteric.  Neck: Supple, no large masses.   Pulmonary:  Good air movement, no audible wheezing bilaterally, no use of accessory muscles.  Cardiac: RRR, no JVD Vascular: 2-3+ edema of the left leg with severe venous changes of the left leg.  Venous ulcer noted in the ankle area on the left, noninfected Vessel Right Left  Radial Palpable Palpable  PT Not Palpable Not Palpable  DP Not Palpable Not Palpable  Gastrointestinal: Non-distended. No guarding/no peritoneal signs.  Musculoskeletal: M/S 5/5 throughout.  No deformity or atrophy.  Neurologic: CN 2-12 intact. Symmetrical.  Speech is fluent. Motor exam as listed above. Psychiatric: Judgment intact, Mood & affect appropriate for pt's clinical situation. Dermatologic: Venous stasis dermatitis with ulcers present on the left.  No changes consistent with cellulitis. Lymph : No lichenification or skin changes of chronic lymphedema.  CBC Lab  Results  Component Value Date   WBC 8.4 03/07/2013   HGB 14.2 03/07/2013   HCT 41.5 03/07/2013   MCV 99 03/07/2013   PLT 212 03/07/2013    BMET    Component Value Date/Time   NA 137 03/07/2013 1849   K 3.8 03/03/2018 0943   K 3.6 03/07/2013 1849   CL 101 03/07/2013 1849   CO2 26 03/07/2013 1849   GLUCOSE 135 (H) 03/07/2013 1849   BUN 16 03/03/2018 0955   BUN 18 03/07/2013 1849   CREATININE 0.85 03/03/2018 0955   CREATININE 1.06 03/07/2013 1849   CALCIUM 9.4 03/07/2013 1849   GFRNONAA >60 03/03/2018 0955   GFRNONAA >60 03/07/2013 1849   GFRAA >60 03/03/2018 0955   GFRAA >60 03/07/2013 1849   Estimated Creatinine Clearance: 100.6 mL/min (by C-G formula based on SCr of 0.85 mg/dL).  COAG Lab Results  Component Value Date   INR 1.1 03/07/2013    Radiology No results found.    Assessment/Plan 1. Chronic venous insufficiency  No surgery or intervention at this point in time.    I have reviewed my discussion with the patient regarding lymphedema and why it  causes symptoms.  Patient will continue wearing graduated compression stockings class 1 (20-30 mmHg) on a daily basis a prescription was given. The patient is reminded to put the stockings on first thing in the morning and removing them in the evening. The patient is instructed specifically not to sleep in the stockings.   In addition, behavioral modification throughout the day will be continued.  This will include frequent elevation (such as in a recliner), use of over the counter pain medications as needed and exercise such as walking.  I have reviewed systemic causes for chronic edema such as liver, kidney and cardiac etiologies and there does not appear to be any significant changes in these organ systems over the past year.  The patient is under the impression that these organ systems are all stable and unchanged.    The patient will continue aggressive use of the  lymph pump.  This will continue to improve the  edema control and prevent sequela such as ulcers and infections.    2. 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  The patient reports that the claudication symptoms and leg pain is essentially gone.   The patient denies lifestyle limiting changes at this point in time.  No further invasive studies, angiography or surgery at this time The patient should continue walking and begin a more formal exercise program.  The patient should continue antiplatelet therapy and aggressive treatment of the lipid abnormalities  Smoking cessation was again discussed  The patient should continue wearing graduated compression socks 10-15 mmHg strength to control the mild edema.  Patient should undergo noninvasive studies as ordered. The patient will follow up with me after the studies.   - VAS [place] ABI WITH/WO TBI; Future - VAS [place] LOWER EXTREMITY ARTERIAL DUPLEX; Future  3. Lymphedema  No surgery or intervention at this point in time.    I have reviewed my discussion with the patient regarding lymphedema and why it  causes symptoms.  Patient will continue wearing graduated compression stockings class 1 (20-30 mmHg) on a daily basis a prescription was given. The patient is reminded to put the stockings on first thing in the morning and removing them in the evening. The patient is instructed specifically not to sleep in the stockings.   In addition, behavioral modification throughout the day will be continued.  This will include frequent elevation (such as in a recliner), use of over the counter pain medications as needed and exercise such as walking.  I have reviewed systemic causes for chronic edema such as liver, kidney and cardiac etiologies and there does not appear to be any significant changes in these organ systems over the past year.  The patient is under the  impression that these organ systems are all stable and unchanged.    The patient will continue aggressive use of the  lymph pump.  This will continue to improve the edema control and prevent sequela such as ulcers and infections.    4. Essential hypertension Continue antihypertensive medications as already ordered, these medications have been reviewed and there are no changes at this time.    Levora Dredge, MD  03/20/2018 8:51 AM

## 2018-03-23 ENCOUNTER — Encounter (INDEPENDENT_AMBULATORY_CARE_PROVIDER_SITE_OTHER): Payer: Self-pay | Admitting: Vascular Surgery

## 2018-03-31 ENCOUNTER — Ambulatory Visit (INDEPENDENT_AMBULATORY_CARE_PROVIDER_SITE_OTHER): Payer: Medicare Other | Admitting: Vascular Surgery

## 2018-06-30 ENCOUNTER — Encounter (INDEPENDENT_AMBULATORY_CARE_PROVIDER_SITE_OTHER): Payer: Self-pay | Admitting: Vascular Surgery

## 2018-06-30 ENCOUNTER — Ambulatory Visit (INDEPENDENT_AMBULATORY_CARE_PROVIDER_SITE_OTHER): Payer: Medicare HMO

## 2018-06-30 ENCOUNTER — Ambulatory Visit (INDEPENDENT_AMBULATORY_CARE_PROVIDER_SITE_OTHER): Payer: Medicare HMO | Admitting: Vascular Surgery

## 2018-06-30 VITALS — BP 124/72 | HR 86 | Resp 16 | Ht 69.0 in | Wt 309.0 lb

## 2018-06-30 DIAGNOSIS — I890 Lymphedema, not elsewhere classified: Secondary | ICD-10-CM | POA: Diagnosis not present

## 2018-06-30 DIAGNOSIS — E1159 Type 2 diabetes mellitus with other circulatory complications: Secondary | ICD-10-CM | POA: Diagnosis not present

## 2018-06-30 DIAGNOSIS — I70245 Atherosclerosis of native arteries of left leg with ulceration of other part of foot: Secondary | ICD-10-CM

## 2018-06-30 DIAGNOSIS — L97529 Non-pressure chronic ulcer of other part of left foot with unspecified severity: Secondary | ICD-10-CM

## 2018-06-30 DIAGNOSIS — I872 Venous insufficiency (chronic) (peripheral): Secondary | ICD-10-CM

## 2018-06-30 DIAGNOSIS — I10 Essential (primary) hypertension: Secondary | ICD-10-CM

## 2018-06-30 NOTE — Progress Notes (Signed)
MRN : [id number]  Darren Frazier is a 77 y.o. (Oct 20, 1940) male who presents with chief complaint of  Chief Complaint  Patient presents with  . Follow-up    ultrasound follow up  .  History of Present Illness:   The patient returns to the office for followup and review status post angiogram with intervention.   Procedure(s) Performed 03/05/2018: 1. Introduction catheter intoleftlower extremity 3rd order catheter placement  2.Contrast injectionleftlower extremity for distal runoff  3. Percutaneous transluminal angioplastyleftsuperficial femoral and popliteal arteries to 6 mm with a [employer] drug-eluting balloon 4. Percutaneous transluminal angioplastytibial peroneal trunk and peroneal 2 4 mm with a [employer] drug-eluting balloon  5. Star close closure rightcommon femoral arteriotomy  The patient notes improvement in the lower extremity symptoms. No interval shortening of the patient's claudication distance or rest pain symptoms. Previous wounds are improved and Podiatry is stating things are better.  No new ulcers or wounds have occurred since the last visit.  The patient is also here for followup evaluation regarding leg swelling.  The swelling has persisted but with the lymph pump is much, much better controlled. The pain associated with swelling is essentially eliminated. There have not been any interval development of a ulcerations or wounds.  The patient denies problems with the pump, noting it is working well and the leggings are in good condition.  Since the previous visit the patient has been wearing graduated compression stockings and using the lymph pump on a routine basis and  has noted significant improvement in the lymphedema.   Patient stated the lymph pump has been a very positive factor in his care.   There have been no significant changes to the patient's overall health care.  The  patient denies amaurosis fugax or recent TIA symptoms. There are no recent neurological changes noted. The patient denies history of DVT, PE or superficial thrombophlebitis. The patient denies recent episodes of angina or shortness of breath.   Current Meds  Medication Sig  . amLODipine (NORVASC) 10 MG tablet Take 10 mg by mouth daily.   [name] aspirin 81 MG chewable tablet Chew by mouth daily.  . bisoprolol (ZEBETA) 10 MG tablet Take 10 mg by mouth daily.   . Calcium Citrate (CITRACAL PO) Take 1 tablet by mouth 2 (two) times daily.  . Cholecalciferol (VITAMIN D3) 1000 units CAPS Take 1,000 Units by mouth daily.   . dabigatran (PRADAXA) 150 MG CAPS capsule Take 150 mg by mouth 2 (two) times daily.   [name] doxazosin (CARDURA) 2 MG tablet Take 2 mg by mouth daily.   . fluticasone (FLONASE) 50 MCG/ACT nasal spray 2 sprays in each twice daily as needed  . gabapentin (NEURONTIN) 100 MG capsule Take 200 mg by mouth 2 (two) times daily.   . hydrochlorothiazide (HYDRODIURIL) 25 MG tablet Take 25 mg by mouth daily.   [name] levothyroxine (SYNTHROID, LEVOTHROID) 88 MCG tablet Take 88 mcg by mouth daily before breakfast.   . losartan (COZAAR) 100 MG tablet Take 100 mg by mouth daily.   . meloxicam (MOBIC) 15 MG tablet Take 15 mg by mouth daily.   . metFORMIN (GLUCOPHAGE) 1000 MG tablet Take 1,000 mg by mouth 2 (two) times daily with a meal.   . Multiple Vitamin (MULTIVITAMIN) capsule Take 1 capsule by mouth daily.   . Omega-3 Fatty Acids (FISH OIL) 1200 MG CAPS Take 1,200 mg by mouth 3 (three) times daily.   . ranitidine (ZANTAC) 150 MG tablet Take 150 mg by mouth  2 (two) times daily.   . vitamin C (ASCORBIC ACID) 500 MG tablet Take 500 mg by mouth daily.    Past Medical History:  Diagnosis Date  . Cardiomyopathy (HCC)   . Diabetes mellitus without complication (HCC)   . Dysrhythmia    atrial fibrillation  . GERD (gastroesophageal reflux disease)   . H/O: urethral stricture    self caths occasionally  .  History of abdominal aortic aneurysm (AAA)    30/78mm in diameter, stable  . History of kidney stones 2017  . Hyperlipidemia   . Hypertension   . Hypothyroidism   . Morbid obesity with BMI of 40.0-44.9, adult (HCC) 2019  . Peripheral vascular disease (HCC) 2019   lymphadema both extremities, ulceration on left big toe  . Stroke (HCC) 2011   x 2 within 6 months. some numbness over upper arms, balance off  . Tremors of nervous system   . Vitamin D deficiency     Past Surgical History:  Procedure Laterality Date  . APPENDECTOMY  1961  . CATARACT EXTRACTION, BILATERAL Bilateral   . COLONOSCOPY    . LOWER EXTREMITY ANGIOGRAPHY Left 03/05/2018   Procedure: LOWER EXTREMITY ANGIOGRAPHY;  Surgeon: Renford Dills, MD;  Location: [facility];  Service: Cardiovascular;  Laterality: Left;  . TONSILLECTOMY      Social History Social History   Tobacco Use  . Smoking status: Former Smoker    Packs/day: 4.00    Years: 30.00    Pack years: 120.00    Types: Cigarettes    Last attempt to quit: 1984    Years since quitting: 35.8  . Smokeless tobacco: Never Used  Substance Use Topics  . Alcohol use: No    Comment: occasional beer. couple years ago  . Drug use: No    Family History Family History  Problem Relation Age of Onset  . Diabetes Father     Allergies  Allergen Reactions  . Ace Inhibitors Other (See Comments)    unknown  . Omeprazole Diarrhea  . Statins Other (See Comments)    Abnormal lab results(pt unsure of what labs)     REVIEW OF SYSTEMS (Negative unless checked)  Constitutional: [] Weight loss  [] Fever  [] Chills Cardiac: [] Chest pain   [] Chest pressure   [] Palpitations   [] Shortness of breath when laying flat   [] Shortness of breath with exertion. Vascular:  [x] Pain in legs with walking   [x] Pain in legs at rest  [] History of DVT   [] Phlebitis   [x] Swelling in legs   [x] Varicose veins   [x] Non-healing ulcers Pulmonary:   [] Uses home oxygen    [] Productive cough   [] Hemoptysis   [] Wheeze  [x] COPD   [] Asthma Neurologic:  [] Dizziness   [] Seizures   [] History of stroke   [] History of TIA  [] Aphasia   [] Vissual changes   [] Weakness or numbness in arm   [] Weakness or numbness in leg Musculoskeletal:   [] Joint swelling   [x] Joint pain   [] Low back pain Hematologic:  [] Easy bruising  [] Easy bleeding   [] Hypercoagulable state   [] Anemic Gastrointestinal:  [] Diarrhea   [] Vomiting  [] Gastroesophageal reflux/heartburn   [] Difficulty swallowing. Genitourinary:  [] Chronic kidney disease   [] Difficult urination  [] Frequent urination   [] Blood in urine Skin:  [x] Rashes   [x] Ulcers  Psychological:  [] History of anxiety   []  History of major depression.  Physical Examination  Vitals:   06/30/18 1005  BP: 124/72  Pulse: 86  Resp: 16  Weight: (!) 309 lb (140.2  kg)  Height: 5\' 9"  (1.753 m)   Body mass index is 45.63 kg/m. Gen: WD/WN, NAD Head: [place]/AT, No temporalis wasting.  Ear/Nose/Throat: Hearing grossly intact, nares w/o erythema or drainage Eyes: PER, EOMI, sclera nonicteric.  Neck: Supple, no large masses.   Pulmonary:  Good air movement, no audible wheezing bilaterally, no use of accessory muscles.  Cardiac: RRR, no JVD Vascular: scattered varicosities present bilaterally.  sever venous stasis changes to the legs bilaterally.  3-4+ soft pitting edema Vessel Right Left  Radial Palpable Palpable  PT Not Palpable Not Palpable  DP Not Palpable Not Palpable  Gastrointestinal: Non-distended. No guarding/no peritoneal signs.  Musculoskeletal: M/S 5/5 throughout.  No deformity or atrophy.  Neurologic: CN 2-12 intact. Symmetrical.  Speech is fluent. Motor exam as listed above. Psychiatric: Judgment intact, Mood & affect appropriate for pt's clinical situation. Dermatologic: Severe venous rashes + ulcers noted.  No changes consistent with cellulitis. Lymph : No lichenification or skin changes of chronic lymphedema.  CBC Lab Results    Component Value Date   WBC 8.4 03/07/2013   HGB 14.2 03/07/2013   HCT 41.5 03/07/2013   MCV 99 03/07/2013   PLT 212 03/07/2013    BMET    Component Value Date/Time   NA 137 03/07/2013 1849   K 3.8 03/03/2018 0943   K 3.6 03/07/2013 1849   CL 101 03/07/2013 1849   CO2 26 03/07/2013 1849   GLUCOSE 135 (H) 03/07/2013 1849   BUN 16 03/03/2018 0955   BUN 18 03/07/2013 1849   CREATININE 0.85 03/03/2018 0955   CREATININE 1.06 03/07/2013 1849   CALCIUM 9.4 03/07/2013 1849   GFRNONAA >60 03/03/2018 0955   GFRNONAA >60 03/07/2013 1849   GFRAA >60 03/03/2018 0955   GFRAA >60 03/07/2013 1849   CrCl cannot be calculated (Patient's most recent lab result is older than the maximum 21 days allowed.).  COAG Lab Results  Component Value Date   INR 1.1 03/07/2013    Radiology No results found.   Assessment/Plan 1. 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  The patient reports that the claudication symptoms and leg pain is essentially gone.   The patient denies lifestyle limiting changes at this point in time.  No further invasive studies, angiography or surgery at this time The patient should continue walking and begin a more formal exercise program.  The patient should continue antiplatelet therapy and aggressive treatment of the lipid abnormalities  Smoking cessation was again discussed  The patient should continue wearing graduated compression socks 10-15 mmHg strength to control the mild edema.  Patient should undergo noninvasive studies as ordered. The patient will follow up with me after the studies.    2. Lymphedema  No surgery or intervention at this point in time.    I have reviewed my discussion with the patient regarding lymphedema and why it  causes symptoms.  Patient will continue wearing graduated compression stockings class  1 (20-30 mmHg) on a daily basis a prescription was given. The patient is reminded to put the stockings on first thing in the morning and removing them in the evening. The patient is instructed specifically not to sleep in the stockings.   In addition, behavioral modification throughout the day will be continued.  This will include frequent elevation (such as in a recliner), use of over the counter pain medications as needed and exercise  such as walking.  I have reviewed systemic causes for chronic edema such as liver, kidney and cardiac etiologies and there does not appear to be any significant changes in these organ systems over the past year.  The patient is under the impression that these organ systems are all stable and unchanged.    The patient will continue aggressive use of the  lymph pump.  This will continue to improve the edema control and prevent sequela such as ulcers and infections.   The patient will follow-up with me on an annual basis.    3. Chronic venous insufficiency  No surgery or intervention at this point in time.    I have reviewed my discussion with the patient regarding lymphedema and why it  causes symptoms.  Patient will continue wearing graduated compression stockings class 1 (20-30 mmHg) on a daily basis a prescription was given. The patient is reminded to put the stockings on first thing in the morning and removing them in the evening. The patient is instructed specifically not to sleep in the stockings.   In addition, behavioral modification throughout the day will be continued.  This will include frequent elevation (such as in a recliner), use of over the counter pain medications as needed and exercise such as walking.  I have reviewed systemic causes for chronic edema such as liver, kidney and cardiac etiologies and there does not appear to be any significant changes in these organ systems over the past year.  The patient is under the impression that these organ  systems are all stable and unchanged.    The patient will continue aggressive use of the  lymph pump.  This will continue to improve the edema control and prevent sequela such as ulcers and infections.   The patient will follow-up with me on an annual basis.    4. Type 2 diabetes mellitus with other circulatory complication, unspecified whether long term insulin use (HCC) Continue hypoglycemic medications as already ordered, these medications have been reviewed and there are no changes at this time.  Hgb A1C to be monitored as already arranged by primary service   5. Essential hypertension Continue antihypertensive medications as already ordered, these medications have been reviewed and there are no changes at this time.     Levora Dredge, MD  06/30/2018 10:20 AM

## 2018-07-10 ENCOUNTER — Encounter (INDEPENDENT_AMBULATORY_CARE_PROVIDER_SITE_OTHER): Payer: Self-pay | Admitting: Vascular Surgery

## 2018-07-21 ENCOUNTER — Ambulatory Visit (INDEPENDENT_AMBULATORY_CARE_PROVIDER_SITE_OTHER): Payer: Medicare HMO | Admitting: Vascular Surgery

## 2018-07-28 ENCOUNTER — Encounter (INDEPENDENT_AMBULATORY_CARE_PROVIDER_SITE_OTHER): Payer: Self-pay | Admitting: Vascular Surgery

## 2018-07-28 ENCOUNTER — Ambulatory Visit (INDEPENDENT_AMBULATORY_CARE_PROVIDER_SITE_OTHER): Payer: Medicare HMO | Admitting: Vascular Surgery

## 2018-07-28 VITALS — BP 150/85 | HR 67 | Resp 20 | Ht 70.0 in | Wt 302.0 lb

## 2018-07-28 DIAGNOSIS — L97529 Non-pressure chronic ulcer of other part of left foot with unspecified severity: Secondary | ICD-10-CM

## 2018-07-28 DIAGNOSIS — I872 Venous insufficiency (chronic) (peripheral): Secondary | ICD-10-CM

## 2018-07-28 DIAGNOSIS — I10 Essential (primary) hypertension: Secondary | ICD-10-CM | POA: Diagnosis not present

## 2018-07-28 DIAGNOSIS — I70245 Atherosclerosis of native arteries of left leg with ulceration of other part of foot: Secondary | ICD-10-CM | POA: Diagnosis not present

## 2018-07-28 DIAGNOSIS — Z87891 Personal history of nicotine dependence: Secondary | ICD-10-CM

## 2018-07-28 DIAGNOSIS — E1159 Type 2 diabetes mellitus with other circulatory complications: Secondary | ICD-10-CM

## 2018-07-28 DIAGNOSIS — I890 Lymphedema, not elsewhere classified: Secondary | ICD-10-CM

## 2018-07-28 NOTE — Progress Notes (Signed)
MRN : [id number]  Darren Frazier is a 77 y.o. (Jan 09, 1941) male who presents with chief complaint of No chief complaint on file. [name]  History of Present Illness:   The patient returns to the office for followup and review status post angiogram with intervention.  Procedure(s) Performed7/11/2017: 1. Introduction catheter intoleftlower extremity 3rd order catheter placement  2.Contrast injectionleftlower extremity for distal runoff  3. Percutaneous transluminal angioplastyleftsuperficial femoral and popliteal arteries to 6 mm with a [employer] drug-eluting balloon 4. Percutaneous transluminal angioplastytibial peroneal trunk and peroneal 2 4 mm with a L[employer] drug-eluting balloon  5. Star close closure rightcommon femoral arteriotomy  The patient notes improvement in the lower extremity symptoms. No interval shortening of the patient's claudication distance or rest pain symptoms. Previous woundsare improved and Podiatry is stating things are better. No new ulcers or wounds have occurred since the last visit.  The patientis also here forfollowup evaluation regarding leg swelling. The swelling has persisted but with the lymph pump is much, much better controlled. The pain associated with swelling is essentially eliminated. There have not been any interval development of a ulcerations or wounds.  The patient denies problems with the pump, noting it is working well and the leggings are in good condition.  Since the previous visit the patient has been wearing graduated compression stockings and using the lymph pump on a routine basis and has noted significant improvement in the lymphedema.   Patient stated the lymph pump has been a very positive factor in his care.  There have been no significant changes to the patient's overall health care.  The patient denies amaurosis fugax or recent TIA symptoms.  There are no recent neurological changes noted. The patient denies history of DVT, PE or superficial thrombophlebitis. The patient denies recent episodes of angina or shortness of breath.  No outpatient medications have been marked as taking for the 07/28/18 encounter (Appointment) with [name], [name], MD.    Past Medical History:  Diagnosis Date  . Cardiomyopathy (HCC)   . Diabetes mellitus without complication (HCC)   . Dysrhythmia    atrial fibrillation  . GERD (gastroesophageal reflux disease)   . H/O: urethral stricture    self caths occasionally  . History of abdominal aortic aneurysm (AAA)    30/14mm in diameter, stable  . History of kidney stones 2017  . Hyperlipidemia   . Hypertension   . Hypothyroidism   . Morbid obesity with BMI of 40.0-44.9, adult (HCC) 2019  . Peripheral vascular disease (HCC) 2019   lymphadema both extremities, ulceration on left big toe  . Stroke (HCC) 2011   x 2 within 6 months. some numbness over upper arms, balance off  . Tremors of nervous system   . Vitamin D deficiency     Past Surgical History:  Procedure Laterality Date  . APPENDECTOMY  1961  . CATARACT EXTRACTION, BILATERAL Bilateral   . COLONOSCOPY    . LOWER EXTREMITY ANGIOGRAPHY Left 03/05/2018   Procedure: LOWER EXTREMITY ANGIOGRAPHY;  Surgeon: Renford Dills, MD;  Location: [facility];  Service: Cardiovascular;  Laterality: Left;  . TONSILLECTOMY      Social History Social History   Tobacco Use  . Smoking status: Former Smoker    Packs/day: 4.00    Years: 30.00    Pack years: 120.00    Types: Cigarettes    Last attempt to quit: 1984    Years since quitting: 35.9  . Smokeless tobacco: Never Used  Substance Use  Topics  . Alcohol use: No    Comment: occasional beer. couple years ago  . Drug use: No    Family History Family History  Problem Relation Age of Onset  . Diabetes Father     Allergies  Allergen Reactions  . Ace Inhibitors Other (See  Comments)    unknown  . Omeprazole Diarrhea  . Statins Other (See Comments)    Abnormal lab results(pt unsure of what labs)     REVIEW OF SYSTEMS (Negative unless checked)  Constitutional: [] Weight loss  [] Fever  [] Chills Cardiac: [] Chest pain   [] Chest pressure   [] Palpitations   [] Shortness of breath when laying flat   [] Shortness of breath with exertion. Vascular:  [x] Pain in legs with walking   [] Pain in legs at rest  [] History of DVT   [] Phlebitis   [x] Swelling in legs   [] Varicose veins   [x] Non-healing ulcers Pulmonary:   [] Uses home oxygen   [] Productive cough   [] Hemoptysis   [] Wheeze  [] COPD   [] Asthma Neurologic:  [] Dizziness   [] Seizures   [] History of stroke   [] History of TIA  [] Aphasia   [] Vissual changes   [] Weakness or numbness in arm   [] Weakness or numbness in leg Musculoskeletal:   [] Joint swelling   [] Joint pain   [] Low back pain Hematologic:  [] Easy bruising  [] Easy bleeding   [] Hypercoagulable state   [] Anemic Gastrointestinal:  [] Diarrhea   [] Vomiting  [] Gastroesophageal reflux/heartburn   [] Difficulty swallowing. Genitourinary:  [] Chronic kidney disease   [] Difficult urination  [] Frequent urination   [] Blood in urine Skin:  [x] Rashes   [x] Ulcers  Psychological:  [] History of anxiety   []  History of major depression.  Physical Examination  There were no vitals filed for this visit. There is no height or weight on file to calculate BMI. Gen: WD/WN, NAD Head: [place]/AT, No temporalis wasting.  Ear/Nose/Throat: Hearing grossly intact, nares w/o erythema or drainage Eyes: PER, EOMI, sclera nonicteric.  Neck: Supple, no large masses.   Pulmonary:  Good air movement, no audible wheezing bilaterally, no use of accessory muscles.  Cardiac: RRR, no JVD Vascular: scattered varicosities present bilaterally.  Severe venous stasis changes to the legs bilaterally.  3-4+ soft pitting edema Vessel Right Left  Radial Palpable Palpable  PT Not Palpable Not Palpable  DP Not  Palpable Not Palpable  Gastrointestinal: Non-distended. No guarding/no peritoneal signs.  Musculoskeletal: M/S 5/5 throughout.  No deformity or atrophy.  Neurologic: CN 2-12 intact. Symmetrical.  Speech is fluent. Motor exam as listed above. Psychiatric: Judgment intact, Mood & affect appropriate for pt's clinical situation. Dermatologic: venous rashes + left great toe ulcers noted.  No changes consistent with cellulitis. Lymph : No lichenification or skin changes of chronic lymphedema.  CBC Lab Results  Component Value Date   WBC 8.4 03/07/2013   HGB 14.2 03/07/2013   HCT 41.5 03/07/2013   MCV 99 03/07/2013   PLT 212 03/07/2013    BMET    Component Value Date/Time   NA 137 03/07/2013 1849   K 3.8 03/03/2018 0943   K 3.6 03/07/2013 1849   CL 101 03/07/2013 1849   CO2 26 03/07/2013 1849   GLUCOSE 135 (H) 03/07/2013 1849   BUN 16 03/03/2018 0955   BUN 18 03/07/2013 1849   CREATININE 0.85 03/03/2018 0955   CREATININE 1.06 03/07/2013 1849   CALCIUM 9.4 03/07/2013 1849   GFRNONAA >60 03/03/2018 0955   GFRNONAA >60 03/07/2013 1849   GFRAA >60 03/03/2018 0955   GFRAA >60  03/07/2013 1849   CrCl cannot be calculated (Patient's most recent lab result is older than the maximum 21 days allowed.).  COAG Lab Results  Component Value Date   INR 1.1 03/07/2013    Radiology Vas [place] Vanice Sarah With/wo Tbi  Result Date: 06/30/2018 LOWER EXTREMITY DOPPLER STUDY Indications: Peripheral artery disease, and 03/05/2018 Left PTA of SFA to Pop, and              TP trunk               Healing ulcer left great toe.  Comparison Study: 02/20/2018 Performing Technologist: Salvadore Farber RVT  Examination Guidelines: A complete evaluation includes at minimum, Doppler waveform signals and systolic blood pressure reading at the level of bilateral brachial, anterior tibial, and posterior tibial arteries, when vessel segments are accessible. Bilateral testing is considered an integral part of a complete examination.  Photoelectric Plethysmograph (PPG) waveforms and toe systolic pressure readings are included as required and additional duplex testing as needed. Limited examinations for reoccurring indications may be performed as noted.  ABI Findings: +---------+------------------+-----+----------+--------+ Right    Rt Pressure (mmHg)IndexWaveform  Comment  +---------+------------------+-----+----------+--------+ Brachial 163                                       +---------+------------------+-----+----------+--------+ ATA      88                0.54 monophasic         +---------+------------------+-----+----------+--------+ PTA      102               0.63 monophasic         +---------+------------------+-----+----------+--------+ Great Toe92                0.56 Abnormal           +---------+------------------+-----+----------+--------+ +---------+------------------+-----+--------+-------+ Left     Lt Pressure (mmHg)IndexWaveformComment +---------+------------------+-----+--------+-------+ Brachial 160                                    +---------+------------------+-----+--------+-------+ ATA      116               0.71                 +---------+------------------+-----+--------+-------+ PTA      122               0.75                 +---------+------------------+-----+--------+-------+ Great Toe95                0.58 Abnormal        +---------+------------------+-----+--------+-------+ +-------+-----------+-----------+------------+------------+ ABI/TBIToday's ABIToday's TBIPrevious ABIPrevious TBI +-------+-----------+-----------+------------+------------+ Right  .63        .56        .93         .43          +-------+-----------+-----------+------------+------------+ Left   .75        .58        .99         dampened     +-------+-----------+-----------+------------+------------+ ABI differences could be related to previous moderate edema that has  improved to allow for a more accurate assessment. Bilateral TBIs appear increased compared to prior study on 02/20/2018.  Summary: Right: Resting right  ankle-brachial index indicates moderate right lower extremity arterial disease. PPG tracings appear dampened. Left: Resting left ankle-brachial index indicates moderate left lower extremity arterial disease. PPG tracings appear dampened.  *See table(s) above for measurements and observations.  Electronically signed by Levora Dredge MD on 06/30/2018 at 4:37:14 PM.    Final    Vas [place] Lower Extremity Arterial Duplex  Result Date: 06/30/2018 LOWER EXTREMITY ARTERIAL DUPLEX STUDY Indications: Peripheral artery disease, and 03/05/2018 Left PTA of SFA to Pop, and              TP trunk               Healing ulcer left great toe.  Current ABI: rt = .63 ; lt = .75 Comparison Study: ABI only done 02/20/2018 Performing Technologist: Salvadore Farber RVT  Examination Guidelines: A complete evaluation includes B-mode imaging, spectral Doppler, color Doppler, and power Doppler as needed of all accessible portions of each vessel. Bilateral testing is considered an integral part of a complete examination. Limited examinations for reoccurring indications may be performed as noted.  Right Duplex Findings:  Not evaluated.  Left Duplex Findings: +----------+--------+-----+--------+----------+--------+           PSV cm/sRatioStenosisWaveform  Comments +----------+--------+-----+--------+----------+--------+ CFA Mid   102                  triphasic          +----------+--------+-----+--------+----------+--------+ DFA       104                  triphasic          +----------+--------+-----+--------+----------+--------+ SFA Prox  105                  triphasic          +----------+--------+-----+--------+----------+--------+ SFA Mid   116                  triphasic          +----------+--------+-----+--------+----------+--------+ SFA Distal104                   triphasic          +----------+--------+-----+--------+----------+--------+ POP Distal51                   triphasic          +----------+--------+-----+--------+----------+--------+ ATA Distal60                   monophasic         +----------+--------+-----+--------+----------+--------+ PTA Distal49                   monophasic         +----------+--------+-----+--------+----------+--------+  Summary: Right: Not evaluated. Left: No stenotic lesions seen in CFA to TP trunk status post PTA. Strong monophasic flow seen in the distal ATA. Collateral flow seen near distal PTA. TBI was obtained on great toe on this study due to improved toe wound. TBI suggests mild disease.  See table(s) above for measurements and observations. Electronically signed by Levora Dredge MD on 06/30/2018 at 4:37:00 PM.    Final      Assessment/Plan 1. 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  The patient reports that the claudication symptoms and leg pain is essentially gone.   The patient denies lifestyle limiting changes at this point  in time.  No further invasive studies, angiography or surgery at this time The patient should continue walking and begin a more formal exercise program.  The patient should continue antiplatelet therapy and aggressive treatment of the lipid abnormalities  Smoking cessation was again discussed  The patient should continue wearing graduated compression socks 10-15 mmHg strength to control the mild edema.  Patient should undergo noninvasive studies as ordered. The patient will follow up with me after the studies.   - VAS [place] ABI WITH/WO TBI; Future  2. Chronic venous insufficiency No surgery or intervention at this point in time.  I have reviewed my discussion with the patient regarding venous insufficiency and why it  causes symptoms. I have discussed with the patient the chronic skin changes that accompany venous insufficiency and the long term sequela such as ulceration. Patient will contnue wearing graduated compression stockings on a daily basis, as this has provided excellent control of his edema. The patient will put the stockings on first thing in the morning and removing them in the evening. The patient is reminded not to sleep in the stockings.  In addition, behavioral modification including elevation during the day will be initiated. Exercise is strongly encouraged.  Given the patient's good control and lack of any problems regarding the venous insufficiency and lymphedema a lymph pump in not need at this time.  The patient will follow up with me PRN should anything change.  The patient voices agreement with this plan.   3. Essential hypertension Continue antihypertensive medications as already ordered, these medications have been reviewed and there are no changes at this time.   4. Lymphedema No surgery or intervention at this point in time.  I have reviewed my discussion with the patient regarding venous insufficiency and why it causes symptoms. I have discussed with the patient the chronic skin changes that accompany venous insufficiency and the long term sequela such as ulceration. Patient will contnue wearing graduated compression stockings on a daily basis, as this has provided excellent control of his edema. The patient will put the stockings on first thing in the morning and removing them in the evening. The patient is reminded not to sleep in the stockings.  In addition, behavioral modification including elevation during the day will be initiated. Exercise is strongly encouraged.  Given the patient's good control and lack of any problems regarding the venous insufficiency and lymphedema a lymph pump in not need at this time.  The patient will follow up with me PRN should anything change.   The patient voices agreement with this plan.   5. Type 2 diabetes mellitus with other circulatory complication, unspecified whether long term insulin use (HCC) Continue hypoglycemic medications as already ordered, these medications have been reviewed and there are no changes at this time.  Hgb A1C to be monitored as already arranged by primary service     Levora Dredge, MD  07/28/2018 8:54 AM

## 2018-08-27 ENCOUNTER — Ambulatory Visit (HOSPITAL_COMMUNITY): Payer: Medicare HMO | Admitting: Anesthesiology

## 2018-08-27 ENCOUNTER — Observation Stay: Payer: Medicare HMO

## 2018-08-27 ENCOUNTER — Emergency Department: Payer: Medicare HMO

## 2018-08-27 ENCOUNTER — Observation Stay
Admission: EM | Admit: 2018-08-27 | Discharge: 2018-08-27 | Disposition: A | Discharge status: Other Acute Inpatient Hospital | Payer: Medicare HMO | Attending: Internal Medicine | Admitting: Internal Medicine

## 2018-08-27 ENCOUNTER — Encounter: Payer: Self-pay | Admitting: Emergency Medicine

## 2018-08-27 ENCOUNTER — Other Ambulatory Visit (HOSPITAL_COMMUNITY): Payer: Self-pay | Admitting: Interventional Radiology

## 2018-08-27 ENCOUNTER — Encounter (HOSPITAL_COMMUNITY)
Admission: EM | Disposition: A | Discharge status: Skilled Nursing Facility | Payer: Self-pay | Source: Home / Self Care | Attending: Neurology

## 2018-08-27 ENCOUNTER — Ambulatory Visit (HOSPITAL_COMMUNITY)
Admission: RE | Admit: 2018-08-27 | Discharge: 2018-08-27 | Disposition: A | Discharge status: Home / Self Care | Payer: Medicare HMO | Source: Ambulatory Visit | Attending: Interventional Radiology | Admitting: Interventional Radiology

## 2018-08-27 ENCOUNTER — Inpatient Hospital Stay (HOSPITAL_COMMUNITY)
Admission: EM | Admit: 2018-08-27 | Discharge: 2018-09-17 | DRG: 023 | Disposition: A | Discharge status: Skilled Nursing Facility | Payer: Medicare HMO | Attending: Internal Medicine | Admitting: Internal Medicine

## 2018-08-27 ENCOUNTER — Encounter (HOSPITAL_COMMUNITY): Payer: Self-pay | Admitting: Certified Registered Nurse Anesthetist

## 2018-08-27 ENCOUNTER — Inpatient Hospital Stay (HOSPITAL_COMMUNITY): Payer: Medicare HMO

## 2018-08-27 DIAGNOSIS — Z87442 Personal history of urinary calculi: Secondary | ICD-10-CM | POA: Diagnosis not present

## 2018-08-27 DIAGNOSIS — N183 Chronic kidney disease, stage 3 (moderate): Secondary | ICD-10-CM | POA: Diagnosis present

## 2018-08-27 DIAGNOSIS — Z79899 Other long term (current) drug therapy: Secondary | ICD-10-CM | POA: Diagnosis not present

## 2018-08-27 DIAGNOSIS — R29704 NIHSS score 4: Secondary | ICD-10-CM | POA: Diagnosis present

## 2018-08-27 DIAGNOSIS — E039 Hypothyroidism, unspecified: Secondary | ICD-10-CM | POA: Insufficient documentation

## 2018-08-27 DIAGNOSIS — D509 Iron deficiency anemia, unspecified: Secondary | ICD-10-CM | POA: Diagnosis present

## 2018-08-27 DIAGNOSIS — Z9989 Dependence on other enabling machines and devices: Secondary | ICD-10-CM | POA: Diagnosis not present

## 2018-08-27 DIAGNOSIS — I428 Other cardiomyopathies: Secondary | ICD-10-CM | POA: Diagnosis present

## 2018-08-27 DIAGNOSIS — E559 Vitamin D deficiency, unspecified: Secondary | ICD-10-CM | POA: Insufficient documentation

## 2018-08-27 DIAGNOSIS — I878 Other specified disorders of veins: Secondary | ICD-10-CM | POA: Insufficient documentation

## 2018-08-27 DIAGNOSIS — G9389 Other specified disorders of brain: Secondary | ICD-10-CM | POA: Insufficient documentation

## 2018-08-27 DIAGNOSIS — I469 Cardiac arrest, cause unspecified: Secondary | ICD-10-CM | POA: Diagnosis not present

## 2018-08-27 DIAGNOSIS — I482 Chronic atrial fibrillation, unspecified: Secondary | ICD-10-CM | POA: Diagnosis not present

## 2018-08-27 DIAGNOSIS — Z4659 Encounter for fitting and adjustment of other gastrointestinal appliance and device: Secondary | ICD-10-CM

## 2018-08-27 DIAGNOSIS — R402313 Coma scale, best motor response, none, at hospital admission: Secondary | ICD-10-CM | POA: Diagnosis present

## 2018-08-27 DIAGNOSIS — R3129 Other microscopic hematuria: Secondary | ICD-10-CM | POA: Diagnosis present

## 2018-08-27 DIAGNOSIS — E46 Unspecified protein-calorie malnutrition: Secondary | ICD-10-CM | POA: Diagnosis present

## 2018-08-27 DIAGNOSIS — Z791 Long term (current) use of non-steroidal anti-inflammatories (NSAID): Secondary | ICD-10-CM | POA: Insufficient documentation

## 2018-08-27 DIAGNOSIS — D6489 Other specified anemias: Secondary | ICD-10-CM | POA: Diagnosis present

## 2018-08-27 DIAGNOSIS — Z794 Long term (current) use of insulin: Secondary | ICD-10-CM | POA: Diagnosis not present

## 2018-08-27 DIAGNOSIS — I10 Essential (primary) hypertension: Secondary | ICD-10-CM | POA: Diagnosis present

## 2018-08-27 DIAGNOSIS — I63512 Cerebral infarction due to unspecified occlusion or stenosis of left middle cerebral artery: Secondary | ICD-10-CM | POA: Diagnosis present

## 2018-08-27 DIAGNOSIS — I639 Cerebral infarction, unspecified: Secondary | ICD-10-CM | POA: Diagnosis not present

## 2018-08-27 DIAGNOSIS — I2510 Atherosclerotic heart disease of native coronary artery without angina pectoris: Secondary | ICD-10-CM | POA: Diagnosis present

## 2018-08-27 DIAGNOSIS — R4701 Aphasia: Secondary | ICD-10-CM | POA: Diagnosis not present

## 2018-08-27 DIAGNOSIS — G9341 Metabolic encephalopathy: Secondary | ICD-10-CM | POA: Diagnosis not present

## 2018-08-27 DIAGNOSIS — G9340 Encephalopathy, unspecified: Secondary | ICD-10-CM | POA: Diagnosis not present

## 2018-08-27 DIAGNOSIS — J9690 Respiratory failure, unspecified, unspecified whether with hypoxia or hypercapnia: Secondary | ICD-10-CM

## 2018-08-27 DIAGNOSIS — I5033 Acute on chronic diastolic (congestive) heart failure: Secondary | ICD-10-CM | POA: Diagnosis not present

## 2018-08-27 DIAGNOSIS — R402113 Coma scale, eyes open, never, at hospital admission: Secondary | ICD-10-CM | POA: Diagnosis present

## 2018-08-27 DIAGNOSIS — E785 Hyperlipidemia, unspecified: Secondary | ICD-10-CM | POA: Diagnosis present

## 2018-08-27 DIAGNOSIS — Z8679 Personal history of other diseases of the circulatory system: Secondary | ICD-10-CM

## 2018-08-27 DIAGNOSIS — I952 Hypotension due to drugs: Secondary | ICD-10-CM | POA: Diagnosis not present

## 2018-08-27 DIAGNOSIS — R578 Other shock: Secondary | ICD-10-CM | POA: Diagnosis not present

## 2018-08-27 DIAGNOSIS — I420 Dilated cardiomyopathy: Secondary | ICD-10-CM | POA: Diagnosis not present

## 2018-08-27 DIAGNOSIS — I5043 Acute on chronic combined systolic (congestive) and diastolic (congestive) heart failure: Secondary | ICD-10-CM | POA: Diagnosis not present

## 2018-08-27 DIAGNOSIS — Z978 Presence of other specified devices: Secondary | ICD-10-CM | POA: Diagnosis not present

## 2018-08-27 DIAGNOSIS — K661 Hemoperitoneum: Secondary | ICD-10-CM | POA: Diagnosis present

## 2018-08-27 DIAGNOSIS — I69398 Other sequelae of cerebral infarction: Secondary | ICD-10-CM

## 2018-08-27 DIAGNOSIS — S8011XA Contusion of right lower leg, initial encounter: Secondary | ICD-10-CM | POA: Diagnosis not present

## 2018-08-27 DIAGNOSIS — D62 Acute posthemorrhagic anemia: Secondary | ICD-10-CM | POA: Diagnosis not present

## 2018-08-27 DIAGNOSIS — F05 Delirium due to known physiological condition: Secondary | ICD-10-CM | POA: Diagnosis not present

## 2018-08-27 DIAGNOSIS — R451 Restlessness and agitation: Secondary | ICD-10-CM | POA: Diagnosis not present

## 2018-08-27 DIAGNOSIS — D72829 Elevated white blood cell count, unspecified: Secondary | ICD-10-CM | POA: Diagnosis not present

## 2018-08-27 DIAGNOSIS — R0602 Shortness of breath: Secondary | ICD-10-CM | POA: Diagnosis not present

## 2018-08-27 DIAGNOSIS — E1142 Type 2 diabetes mellitus with diabetic polyneuropathy: Secondary | ICD-10-CM | POA: Diagnosis present

## 2018-08-27 DIAGNOSIS — I959 Hypotension, unspecified: Secondary | ICD-10-CM | POA: Diagnosis not present

## 2018-08-27 DIAGNOSIS — Z7982 Long term (current) use of aspirin: Secondary | ICD-10-CM | POA: Insufficient documentation

## 2018-08-27 DIAGNOSIS — Z87891 Personal history of nicotine dependence: Secondary | ICD-10-CM

## 2018-08-27 DIAGNOSIS — Z6841 Body Mass Index (BMI) 40.0 and over, adult: Secondary | ICD-10-CM | POA: Diagnosis not present

## 2018-08-27 DIAGNOSIS — I509 Heart failure, unspecified: Secondary | ICD-10-CM | POA: Diagnosis not present

## 2018-08-27 DIAGNOSIS — Z79890 Hormone replacement therapy (postmenopausal): Secondary | ICD-10-CM

## 2018-08-27 DIAGNOSIS — E1151 Type 2 diabetes mellitus with diabetic peripheral angiopathy without gangrene: Secondary | ICD-10-CM | POA: Diagnosis present

## 2018-08-27 DIAGNOSIS — G8191 Hemiplegia, unspecified affecting right dominant side: Secondary | ICD-10-CM | POA: Diagnosis not present

## 2018-08-27 DIAGNOSIS — Z888 Allergy status to other drugs, medicaments and biological substances status: Secondary | ICD-10-CM

## 2018-08-27 DIAGNOSIS — Y838 Other surgical procedures as the cause of abnormal reaction of the patient, or of later complication, without mention of misadventure at the time of the procedure: Secondary | ICD-10-CM | POA: Diagnosis not present

## 2018-08-27 DIAGNOSIS — R509 Fever, unspecified: Secondary | ICD-10-CM | POA: Diagnosis not present

## 2018-08-27 DIAGNOSIS — Z9842 Cataract extraction status, left eye: Secondary | ICD-10-CM

## 2018-08-27 DIAGNOSIS — I4891 Unspecified atrial fibrillation: Secondary | ICD-10-CM | POA: Diagnosis not present

## 2018-08-27 DIAGNOSIS — J9622 Acute and chronic respiratory failure with hypercapnia: Secondary | ICD-10-CM | POA: Diagnosis not present

## 2018-08-27 DIAGNOSIS — J810 Acute pulmonary edema: Secondary | ICD-10-CM | POA: Diagnosis not present

## 2018-08-27 DIAGNOSIS — R531 Weakness: Secondary | ICD-10-CM | POA: Diagnosis present

## 2018-08-27 DIAGNOSIS — Z7901 Long term (current) use of anticoagulants: Secondary | ICD-10-CM | POA: Insufficient documentation

## 2018-08-27 DIAGNOSIS — K567 Ileus, unspecified: Secondary | ICD-10-CM | POA: Diagnosis not present

## 2018-08-27 DIAGNOSIS — R402213 Coma scale, best verbal response, none, at hospital admission: Secondary | ICD-10-CM | POA: Diagnosis present

## 2018-08-27 DIAGNOSIS — J9601 Acute respiratory failure with hypoxia: Secondary | ICD-10-CM | POA: Diagnosis present

## 2018-08-27 DIAGNOSIS — I724 Aneurysm of artery of lower extremity: Secondary | ICD-10-CM | POA: Diagnosis not present

## 2018-08-27 DIAGNOSIS — N179 Acute kidney failure, unspecified: Secondary | ICD-10-CM | POA: Diagnosis not present

## 2018-08-27 DIAGNOSIS — K219 Gastro-esophageal reflux disease without esophagitis: Secondary | ICD-10-CM | POA: Insufficient documentation

## 2018-08-27 DIAGNOSIS — I672 Cerebral atherosclerosis: Secondary | ICD-10-CM | POA: Diagnosis present

## 2018-08-27 DIAGNOSIS — R0603 Acute respiratory distress: Secondary | ICD-10-CM

## 2018-08-27 DIAGNOSIS — I5023 Acute on chronic systolic (congestive) heart failure: Secondary | ICD-10-CM | POA: Diagnosis not present

## 2018-08-27 DIAGNOSIS — N35919 Unspecified urethral stricture, male, unspecified site: Secondary | ICD-10-CM | POA: Diagnosis present

## 2018-08-27 DIAGNOSIS — G92 Toxic encephalopathy: Secondary | ICD-10-CM | POA: Diagnosis not present

## 2018-08-27 DIAGNOSIS — R1312 Dysphagia, oropharyngeal phase: Secondary | ICD-10-CM | POA: Diagnosis not present

## 2018-08-27 DIAGNOSIS — I130 Hypertensive heart and chronic kidney disease with heart failure and stage 1 through stage 4 chronic kidney disease, or unspecified chronic kidney disease: Secondary | ICD-10-CM | POA: Diagnosis not present

## 2018-08-27 DIAGNOSIS — Z781 Physical restraint status: Secondary | ICD-10-CM

## 2018-08-27 DIAGNOSIS — R1110 Vomiting, unspecified: Secondary | ICD-10-CM | POA: Diagnosis not present

## 2018-08-27 DIAGNOSIS — I4819 Other persistent atrial fibrillation: Secondary | ICD-10-CM | POA: Diagnosis present

## 2018-08-27 DIAGNOSIS — I97618 Postprocedural hemorrhage and hematoma of a circulatory system organ or structure following other circulatory system procedure: Secondary | ICD-10-CM | POA: Diagnosis not present

## 2018-08-27 DIAGNOSIS — I63232 Cerebral infarction due to unspecified occlusion or stenosis of left carotid arteries: Secondary | ICD-10-CM | POA: Diagnosis present

## 2018-08-27 DIAGNOSIS — J9600 Acute respiratory failure, unspecified whether with hypoxia or hypercapnia: Secondary | ICD-10-CM | POA: Diagnosis not present

## 2018-08-27 DIAGNOSIS — Z95828 Presence of other vascular implants and grafts: Secondary | ICD-10-CM | POA: Diagnosis not present

## 2018-08-27 DIAGNOSIS — I6310 Cerebral infarction due to embolism of unspecified precerebral artery: Secondary | ICD-10-CM | POA: Diagnosis not present

## 2018-08-27 DIAGNOSIS — M4802 Spinal stenosis, cervical region: Secondary | ICD-10-CM | POA: Insufficient documentation

## 2018-08-27 DIAGNOSIS — I214 Non-ST elevation (NSTEMI) myocardial infarction: Secondary | ICD-10-CM | POA: Diagnosis not present

## 2018-08-27 DIAGNOSIS — I872 Venous insufficiency (chronic) (peripheral): Secondary | ICD-10-CM | POA: Insufficient documentation

## 2018-08-27 DIAGNOSIS — Z452 Encounter for adjustment and management of vascular access device: Secondary | ICD-10-CM

## 2018-08-27 DIAGNOSIS — I63412 Cerebral infarction due to embolism of left middle cerebral artery: Secondary | ICD-10-CM | POA: Diagnosis not present

## 2018-08-27 DIAGNOSIS — G4733 Obstructive sleep apnea (adult) (pediatric): Secondary | ICD-10-CM | POA: Diagnosis present

## 2018-08-27 DIAGNOSIS — T4275XA Adverse effect of unspecified antiepileptic and sedative-hypnotic drugs, initial encounter: Secondary | ICD-10-CM | POA: Diagnosis present

## 2018-08-27 DIAGNOSIS — I6522 Occlusion and stenosis of left carotid artery: Secondary | ICD-10-CM | POA: Diagnosis not present

## 2018-08-27 DIAGNOSIS — I890 Lymphedema, not elsewhere classified: Secondary | ICD-10-CM | POA: Diagnosis not present

## 2018-08-27 DIAGNOSIS — I6523 Occlusion and stenosis of bilateral carotid arteries: Secondary | ICD-10-CM | POA: Insufficient documentation

## 2018-08-27 DIAGNOSIS — E1122 Type 2 diabetes mellitus with diabetic chronic kidney disease: Secondary | ICD-10-CM | POA: Diagnosis present

## 2018-08-27 DIAGNOSIS — I729 Aneurysm of unspecified site: Secondary | ICD-10-CM | POA: Diagnosis not present

## 2018-08-27 DIAGNOSIS — N390 Urinary tract infection, site not specified: Secondary | ICD-10-CM | POA: Diagnosis present

## 2018-08-27 DIAGNOSIS — I700 Atherosclerosis of aorta: Secondary | ICD-10-CM | POA: Insufficient documentation

## 2018-08-27 DIAGNOSIS — I4510 Unspecified right bundle-branch block: Secondary | ICD-10-CM | POA: Diagnosis present

## 2018-08-27 DIAGNOSIS — E876 Hypokalemia: Secondary | ICD-10-CM | POA: Diagnosis not present

## 2018-08-27 DIAGNOSIS — Z7951 Long term (current) use of inhaled steroids: Secondary | ICD-10-CM | POA: Insufficient documentation

## 2018-08-27 DIAGNOSIS — I714 Abdominal aortic aneurysm, without rupture: Secondary | ICD-10-CM | POA: Diagnosis present

## 2018-08-27 DIAGNOSIS — E1165 Type 2 diabetes mellitus with hyperglycemia: Secondary | ICD-10-CM | POA: Diagnosis present

## 2018-08-27 DIAGNOSIS — J690 Pneumonitis due to inhalation of food and vomit: Secondary | ICD-10-CM | POA: Diagnosis present

## 2018-08-27 DIAGNOSIS — R609 Edema, unspecified: Secondary | ICD-10-CM | POA: Diagnosis not present

## 2018-08-27 DIAGNOSIS — E1159 Type 2 diabetes mellitus with other circulatory complications: Secondary | ICD-10-CM | POA: Diagnosis not present

## 2018-08-27 DIAGNOSIS — I9589 Other hypotension: Secondary | ICD-10-CM | POA: Diagnosis not present

## 2018-08-27 DIAGNOSIS — D539 Nutritional anemia, unspecified: Secondary | ICD-10-CM | POA: Diagnosis present

## 2018-08-27 DIAGNOSIS — R1310 Dysphagia, unspecified: Secondary | ICD-10-CM | POA: Diagnosis not present

## 2018-08-27 DIAGNOSIS — E119 Type 2 diabetes mellitus without complications: Secondary | ICD-10-CM

## 2018-08-27 DIAGNOSIS — E872 Acidosis: Secondary | ICD-10-CM | POA: Diagnosis not present

## 2018-08-27 DIAGNOSIS — Z8673 Personal history of transient ischemic attack (TIA), and cerebral infarction without residual deficits: Secondary | ICD-10-CM | POA: Diagnosis not present

## 2018-08-27 DIAGNOSIS — I5031 Acute diastolic (congestive) heart failure: Secondary | ICD-10-CM | POA: Diagnosis not present

## 2018-08-27 DIAGNOSIS — Z9841 Cataract extraction status, right eye: Secondary | ICD-10-CM

## 2018-08-27 DIAGNOSIS — R233 Spontaneous ecchymoses: Secondary | ICD-10-CM | POA: Diagnosis not present

## 2018-08-27 DIAGNOSIS — R0902 Hypoxemia: Secondary | ICD-10-CM | POA: Diagnosis not present

## 2018-08-27 DIAGNOSIS — I4811 Longstanding persistent atrial fibrillation: Secondary | ICD-10-CM | POA: Diagnosis not present

## 2018-08-27 DIAGNOSIS — I455 Other specified heart block: Secondary | ICD-10-CM | POA: Diagnosis not present

## 2018-08-27 DIAGNOSIS — R4182 Altered mental status, unspecified: Secondary | ICD-10-CM | POA: Diagnosis not present

## 2018-08-27 DIAGNOSIS — I4821 Permanent atrial fibrillation: Secondary | ICD-10-CM | POA: Diagnosis not present

## 2018-08-27 DIAGNOSIS — Z5320 Procedure and treatment not carried out because of patient's decision for unspecified reasons: Secondary | ICD-10-CM | POA: Diagnosis not present

## 2018-08-27 DIAGNOSIS — I6521 Occlusion and stenosis of right carotid artery: Secondary | ICD-10-CM | POA: Diagnosis not present

## 2018-08-27 HISTORY — PX: IR ANGIO INTRA EXTRACRAN SEL COM CAROTID INNOMINATE UNI R MOD SED: IMG5359

## 2018-08-27 HISTORY — PX: RADIOLOGY WITH ANESTHESIA: SHX6223

## 2018-08-27 HISTORY — PX: IR ANGIO VERTEBRAL SEL SUBCLAVIAN INNOMINATE UNI L MOD SED: IMG5364

## 2018-08-27 HISTORY — PX: IR CT HEAD LTD: IMG2386

## 2018-08-27 HISTORY — PX: IR PERCUTANEOUS ART THROMBECTOMY/INFUSION INTRACRANIAL INC DIAG ANGIO: IMG6087

## 2018-08-27 LAB — URINALYSIS, COMPLETE (UACMP) WITH MICROSCOPIC
Bacteria, UA: NONE SEEN
Bilirubin Urine: NEGATIVE
Glucose, UA: NEGATIVE mg/dL
Hgb urine dipstick: NEGATIVE
Ketones, ur: NEGATIVE mg/dL
Nitrite: NEGATIVE
Protein, ur: 30 mg/dL — AB
Specific Gravity, Urine: 1.027 (ref 1.005–1.030)
pH: 6 (ref 5.0–8.0)

## 2018-08-27 LAB — POCT I-STAT 3, ART BLOOD GAS (G3+)
Bicarbonate: 25.5 mmol/L (ref 20.0–28.0)
O2 Saturation: 97 %
TCO2: 27 mmol/L (ref 22–32)
pCO2 arterial: 44.7 mmHg (ref 32.0–48.0)
pH, Arterial: 7.364 (ref 7.350–7.450)
pO2, Arterial: 99 mmHg (ref 83.0–108.0)

## 2018-08-27 LAB — MRSA PCR SCREENING: MRSA BY PCR: POSITIVE — AB

## 2018-08-27 LAB — CBC
HCT: 39.6 % (ref 39.0–52.0)
Hemoglobin: 13 g/dL (ref 13.0–17.0)
MCH: 32 pg (ref 26.0–34.0)
MCHC: 32.8 g/dL (ref 30.0–36.0)
MCV: 97.5 fL (ref 80.0–100.0)
NRBC: 0 % (ref 0.0–0.2)
Platelets: 226 10*3/uL (ref 150–400)
RBC: 4.06 MIL/uL — ABNORMAL LOW (ref 4.22–5.81)
RDW: 13.5 % (ref 11.5–15.5)
WBC: 10.9 10*3/uL — AB (ref 4.0–10.5)

## 2018-08-27 LAB — BASIC METABOLIC PANEL
Anion gap: 12 (ref 5–15)
BUN: 21 mg/dL (ref 8–23)
CO2: 25 mmol/L (ref 22–32)
Calcium: 9.4 mg/dL (ref 8.9–10.3)
Chloride: 99 mmol/L (ref 98–111)
Creatinine, Ser: 1.02 mg/dL (ref 0.61–1.24)
GFR calc non Af Amer: >60 mL/min (ref >60)
Glucose, Bld: 190 mg/dL — ABNORMAL HIGH (ref 70–99)
Potassium: 3.8 mmol/L (ref 3.5–5.1)
SODIUM: 136 mmol/L (ref 135–145)

## 2018-08-27 LAB — PROTIME-INR
INR: 1.5
Prothrombin Time: 17.9 seconds — ABNORMAL HIGH (ref 11.4–15.2)

## 2018-08-27 LAB — POCT ACTIVATED CLOTTING TIME
Activated Clotting Time: 175 seconds
Activated Clotting Time: 169 seconds

## 2018-08-27 LAB — APTT: aPTT: 83 seconds — ABNORMAL HIGH (ref 24–36)

## 2018-08-27 SURGERY — IR WITH ANESTHESIA
Anesthesia: Choice

## 2018-08-27 SURGERY — IR WITH ANESTHESIA
Anesthesia: General

## 2018-08-27 MED ORDER — CEFAZOLIN SODIUM-DEXTROSE 2-3 GM-%(50ML) IV SOLR
INTRAVENOUS | Status: DC | PRN
  Administered 2018-08-27: 2 g via INTRAVENOUS
  Administered 2018-08-27: 1 g via INTRAVENOUS

## 2018-08-27 MED ORDER — BISOPROLOL FUMARATE 5 MG PO TABS: 10.0000 mg | ORAL_TABLET | Freq: Every day | ORAL | Status: DC

## 2018-08-27 MED ORDER — LIDOCAINE HCL 1 % IJ SOLN
INTRAMUSCULAR | Status: AC
  Filled 2018-08-27: qty 20

## 2018-08-27 MED ORDER — NITROGLYCERIN 1 MG/10 ML FOR IR/CATH LAB
INTRA_ARTERIAL | Status: AC
  Filled 2018-08-27: qty 10

## 2018-08-27 MED ORDER — EPTIFIBATIDE 20 MG/10ML IV SOLN
INTRAVENOUS | Status: AC
  Filled 2018-08-27: qty 10

## 2018-08-27 MED ORDER — ACETAMINOPHEN 160 MG/5ML PO SOLN: 650.0000 mg | ORAL | Status: DC | PRN

## 2018-08-27 MED ORDER — FENTANYL CITRATE (PF) 100 MCG/2ML IJ SOLN
INTRAMUSCULAR | Status: DC | PRN
  Administered 2018-08-27: 100 ug via INTRAVENOUS
  Administered 2018-08-27: 150 ug via INTRAVENOUS

## 2018-08-27 MED ORDER — ACETAMINOPHEN 160 MG/5ML PO SOLN
650.0000 mg | ORAL | Status: DC | PRN
  Filled 2018-08-27: qty 20.3

## 2018-08-27 MED ORDER — ACETAMINOPHEN 325 MG PO TABS: 650.0000 mg | ORAL_TABLET | ORAL | Status: DC | PRN

## 2018-08-27 MED ORDER — SUCCINYLCHOLINE CHLORIDE 20 MG/ML IJ SOLN
INTRAMUSCULAR | Status: DC | PRN
  Administered 2018-08-27: 140 mg via INTRAVENOUS

## 2018-08-27 MED ORDER — IOPAMIDOL (ISOVUE-370) INJECTION 76%
40.0000 mL | Freq: Once | INTRAVENOUS | Status: AC | PRN
  Administered 2018-08-27: 40 mL via INTRAVENOUS

## 2018-08-27 MED ORDER — SODIUM CHLORIDE 0.9 % IV SOLN
INTRAVENOUS | Status: DC | PRN
  Administered 2018-08-27: 40 ug/min via INTRAVENOUS

## 2018-08-27 MED ORDER — IOHEXOL 300 MG/ML  SOLN
120.0000 mL | Freq: Once | INTRAMUSCULAR | Status: AC | PRN
  Administered 2018-08-27: 120 mL via INTRA_ARTERIAL

## 2018-08-27 MED ORDER — ASPIRIN 81 MG PO CHEW
CHEWABLE_TABLET | ORAL | Status: AC
  Filled 2018-08-27: qty 1

## 2018-08-27 MED ORDER — ATROPINE SULFATE 1 MG/10ML IJ SOSY
PREFILLED_SYRINGE | INTRAMUSCULAR | Status: AC
  Filled 2018-08-27: qty 10

## 2018-08-27 MED ORDER — AMLODIPINE BESYLATE 5 MG PO TABS: 10.0000 mg | ORAL_TABLET | Freq: Every day | ORAL | Status: DC

## 2018-08-27 MED ORDER — LEVOTHYROXINE SODIUM 88 MCG PO TABS: 88.0000 ug | ORAL_TABLET | Freq: Every day | ORAL | Status: DC

## 2018-08-27 MED ORDER — CEFAZOLIN SODIUM-DEXTROSE 2-4 GM/100ML-% IV SOLN
INTRAVENOUS | Status: AC
  Filled 2018-08-27: qty 100

## 2018-08-27 MED ORDER — ACETAMINOPHEN 160 MG/5ML PO SOLN
650.0000 mg | ORAL | Status: DC | PRN
  Administered 2018-08-29 – 2018-09-01 (×9): 650 mg
  Filled 2018-08-27 (×10): qty 20.3

## 2018-08-27 MED ORDER — DOXAZOSIN MESYLATE 2 MG PO TABS: 2.0000 mg | ORAL_TABLET | Freq: Every day | ORAL | Status: DC

## 2018-08-27 MED ORDER — PROPOFOL 1000 MG/100ML IV EMUL
INTRAVENOUS | Status: AC
  Filled 2018-08-27: qty 100

## 2018-08-27 MED ORDER — HYDROCHLOROTHIAZIDE 25 MG PO TABS: 25.0000 mg | ORAL_TABLET | Freq: Every day | ORAL | Status: DC

## 2018-08-27 MED ORDER — TICAGRELOR 90 MG PO TABS
ORAL_TABLET | ORAL | Status: AC
  Filled 2018-08-27: qty 2

## 2018-08-27 MED ORDER — FENTANYL CITRATE (PF) 100 MCG/2ML IJ SOLN: 50.0000 ug | Freq: Once | INTRAMUSCULAR | Status: DC

## 2018-08-27 MED ORDER — TICAGRELOR 60 MG PO TABS
ORAL_TABLET | ORAL | Status: DC | PRN
  Administered 2018-08-27: 180 mg

## 2018-08-27 MED ORDER — PROPOFOL 1000 MG/100ML IV EMUL
5.0000 ug/kg/min | INTRAVENOUS | Status: DC
  Administered 2018-08-27 – 2018-08-28 (×2): 50 ug/kg/min via INTRAVENOUS
  Administered 2018-08-28 (×2): 35 ug/kg/min via INTRAVENOUS
  Administered 2018-08-28: 20 ug/kg/min via INTRAVENOUS
  Administered 2018-08-28 – 2018-08-29 (×2): 40 ug/kg/min via INTRAVENOUS
  Filled 2018-08-27 (×6): qty 100

## 2018-08-27 MED ORDER — FENTANYL 2500MCG IN NS 250ML (10MCG/ML) PREMIX INFUSION
25.0000 ug/h | INTRAVENOUS | Status: DC
  Administered 2018-08-27: 100 ug/h via INTRAVENOUS
  Administered 2018-08-28: 150 ug/h via INTRAVENOUS
  Administered 2018-08-29: 200 ug/h via INTRAVENOUS
  Administered 2018-08-29: 100 ug/h via INTRAVENOUS
  Administered 2018-08-30: 150 ug/h via INTRAVENOUS
  Administered 2018-08-31: 125 ug/h via INTRAVENOUS
  Administered 2018-08-31: 200 ug/h via INTRAVENOUS
  Filled 2018-08-27 (×8): qty 250

## 2018-08-27 MED ORDER — ASPIRIN 81 MG PO CHEW: 81.0000 mg | CHEWABLE_TABLET | Freq: Every day | ORAL | Status: DC

## 2018-08-27 MED ORDER — PROPOFOL 10 MG/ML IV BOLUS
INTRAVENOUS | Status: DC | PRN
  Administered 2018-08-27: 150 mg via INTRAVENOUS

## 2018-08-27 MED ORDER — ONDANSETRON HCL 4 MG/2ML IJ SOLN
4.0000 mg | Freq: Four times a day (QID) | INTRAMUSCULAR | Status: DC | PRN
  Administered 2018-09-17: 4 mg via INTRAVENOUS
  Filled 2018-08-27 (×2): qty 2

## 2018-08-27 MED ORDER — LIDOCAINE HCL (CARDIAC) PF 100 MG/5ML IV SOSY
PREFILLED_SYRINGE | INTRAVENOUS | Status: DC | PRN
  Administered 2018-08-27: 80 mg via INTRAVENOUS

## 2018-08-27 MED ORDER — ACETAMINOPHEN 650 MG RE SUPP: 650.0000 mg | RECTAL | Status: DC | PRN

## 2018-08-27 MED ORDER — DABIGATRAN ETEXILATE MESYLATE 150 MG PO CAPS: 150.0000 mg | ORAL_CAPSULE | Freq: Two times a day (BID) | ORAL | Status: DC

## 2018-08-27 MED ORDER — SODIUM CHLORIDE 0.9 % IV SOLN
INTRAVENOUS | Status: DC
  Administered 2018-08-27 (×2): via INTRAVENOUS

## 2018-08-27 MED ORDER — METFORMIN HCL 500 MG PO TABS: 1000.0000 mg | ORAL_TABLET | Freq: Two times a day (BID) | ORAL | Status: DC

## 2018-08-27 MED ORDER — CLOPIDOGREL BISULFATE 300 MG PO TABS
ORAL_TABLET | ORAL | Status: AC
  Filled 2018-08-27: qty 1

## 2018-08-27 MED ORDER — LOSARTAN POTASSIUM 50 MG PO TABS: 100.0000 mg | ORAL_TABLET | Freq: Every day | ORAL | Status: DC

## 2018-08-27 MED ORDER — VITAMIN C 500 MG PO TABS: 500.0000 mg | ORAL_TABLET | Freq: Every day | ORAL | Status: DC

## 2018-08-27 MED ORDER — SODIUM CHLORIDE 0.9 % IV SOLN: 250.0000 mL | INTRAVENOUS | Status: DC

## 2018-08-27 MED ORDER — ACETAMINOPHEN 325 MG PO TABS
650.0000 mg | ORAL_TABLET | ORAL | Status: DC | PRN
  Administered 2018-09-04 – 2018-09-09 (×4): 650 mg via ORAL
  Filled 2018-08-27 (×6): qty 2

## 2018-08-27 MED ORDER — FLUTICASONE PROPIONATE 50 MCG/ACT NA SUSP: 2.0000 | Freq: Every day | NASAL | Status: DC | PRN

## 2018-08-27 MED ORDER — FENTANYL BOLUS VIA INFUSION
25.0000 ug | INTRAVENOUS | Status: DC | PRN
  Administered 2018-08-27 – 2018-08-29 (×7): 25 ug via INTRAVENOUS
  Filled 2018-08-27: qty 25

## 2018-08-27 MED ORDER — SENNOSIDES-DOCUSATE SODIUM 8.6-50 MG PO TABS
1.0000 | ORAL_TABLET | Freq: Every evening | ORAL | Status: DC | PRN
  Filled 2018-08-27: qty 1

## 2018-08-27 MED ORDER — PROPOFOL 500 MG/50ML IV EMUL
INTRAVENOUS | Status: DC | PRN
  Administered 2018-08-27: 50 ug/kg/min via INTRAVENOUS

## 2018-08-27 MED ORDER — EPTIFIBATIDE 20 MG/10ML IV SOLN
INTRAVENOUS | Status: DC | PRN
  Administered 2018-08-27 (×3): 1.5 mg

## 2018-08-27 MED ORDER — EPHEDRINE SULFATE 50 MG/ML IJ SOLN
INTRAMUSCULAR | Status: DC | PRN
  Administered 2018-08-27: 5 mg via INTRAVENOUS

## 2018-08-27 MED ORDER — INSULIN ASPART 100 UNIT/ML ~~LOC~~ SOLN: 0.0000 [IU] | Freq: Three times a day (TID) | SUBCUTANEOUS | Status: DC

## 2018-08-27 MED ORDER — SODIUM CHLORIDE 0.9 % IV SOLN
INTRAVENOUS | Status: DC
  Administered 2018-08-27 – 2018-08-31 (×5): via INTRAVENOUS

## 2018-08-27 MED ORDER — STROKE: EARLY STAGES OF RECOVERY BOOK
Freq: Once | Status: DC
  Filled 2018-08-27: qty 1

## 2018-08-27 MED ORDER — ACETAMINOPHEN 650 MG RE SUPP
650.0000 mg | RECTAL | Status: DC | PRN
  Administered 2018-08-30 – 2018-09-01 (×4): 650 mg via RECTAL
  Filled 2018-08-27 (×5): qty 1

## 2018-08-27 MED ORDER — ASPIRIN 325 MG PO TABS
ORAL_TABLET | ORAL | Status: AC
  Filled 2018-08-27: qty 1

## 2018-08-27 MED ORDER — IOPAMIDOL (ISOVUE-370) INJECTION 76%
75.0000 mL | Freq: Once | INTRAVENOUS | Status: AC | PRN
  Administered 2018-08-27: 75 mL via INTRAVENOUS

## 2018-08-27 MED ORDER — CLEVIDIPINE BUTYRATE 0.5 MG/ML IV EMUL
0.0000 mg/h | INTRAVENOUS | Status: AC
  Administered 2018-08-27: 1 mg/h via INTRAVENOUS
  Filled 2018-08-27: qty 50

## 2018-08-27 MED ORDER — HEPARIN SODIUM (PORCINE) 1000 UNIT/ML IJ SOLN
INTRAMUSCULAR | Status: DC | PRN
  Administered 2018-08-27: 3000 [IU] via INTRAVENOUS

## 2018-08-27 MED ORDER — STROKE: EARLY STAGES OF RECOVERY BOOK: Freq: Once | Status: DC

## 2018-08-27 MED ORDER — ROCURONIUM BROMIDE 100 MG/10ML IV SOLN
INTRAVENOUS | Status: DC | PRN
  Administered 2018-08-27: 100 mg via INTRAVENOUS
  Administered 2018-08-27: 50 mg via INTRAVENOUS
  Administered 2018-08-27: 20 mg via INTRAVENOUS

## 2018-08-27 MED ORDER — GABAPENTIN 100 MG PO CAPS: 200.0000 mg | ORAL_CAPSULE | Freq: Two times a day (BID) | ORAL | Status: DC

## 2018-08-27 MED ORDER — PHENYLEPHRINE HCL 10 MG/ML IJ SOLN
INTRAMUSCULAR | Status: DC | PRN
  Administered 2018-08-27 (×2): 80 ug via INTRAVENOUS
  Administered 2018-08-27: 160 ug via INTRAVENOUS
  Administered 2018-08-27: 80 ug via INTRAVENOUS

## 2018-08-27 MED ORDER — TIROFIBAN HCL IN NACL 5-0.9 MG/100ML-% IV SOLN
INTRAVENOUS | Status: AC
  Filled 2018-08-27: qty 100

## 2018-08-27 MED ORDER — ONDANSETRON HCL 4 MG/2ML IJ SOLN
INTRAMUSCULAR | Status: DC | PRN
  Administered 2018-08-27: 4 mg via INTRAVENOUS

## 2018-08-27 MED ORDER — FAMOTIDINE 20 MG PO TABS: 20.0000 mg | ORAL_TABLET | Freq: Two times a day (BID) | ORAL | Status: DC

## 2018-08-27 MED ORDER — ASPIRIN 81 MG PO CHEW
CHEWABLE_TABLET | ORAL | Status: DC | PRN
  Administered 2018-08-27: 81 mg

## 2018-08-27 NOTE — Transfer of Care (Signed)
Immediate Anesthesia Transfer of Care Note  Patient: [name]  Procedure(s) Performed: IR WITH ANESTHESIA (N/A )  Patient Location: NICU  Anesthesia Type:General  Level of Consciousness: sedated and Patient remains intubated per anesthesia plan  Airway & Oxygen Therapy: Patient remains intubated per anesthesia plan and Patient placed on Ventilator (see vital sign flow sheet for setting)  Post-op Assessment: Report given to RN and Post -op Vital signs reviewed and stable  Post vital signs: Reviewed and stable  Last Vitals:  Vitals Value Taken Time  BP    Temp    Pulse    Resp 16 08/27/2018  8:20 PM  SpO2    Vitals shown include unvalidated device data.  Last Pain: There were no vitals filed for this visit.       Complications: No apparent anesthesia complications

## 2018-08-27 NOTE — Progress Notes (Signed)
Patient ID: Darren Frazier, male   DOB: 1941/01/31, 77 y.o.   MRN: [id number] INR posterior procedure. Patient left intubated with Rt groin 18F sheath in place. RT groin soft. Distal pulses Dopplerable DP and PT bilaterally. S.Abbigael Detlefsen MD

## 2018-08-27 NOTE — ED Triage Notes (Signed)
Pt normally can walk per [facility]. Pt had c/o right leg weakness, SOB, and chest pressure. Pt has hx of 2 strokes with limited defects. Pt has defects in RLE from hip down. [facility] stating some right foot drag with walking. Edema in BLE is normal. Concern is for the RLE weakness and chest pressure that occurred after slide from bed to floor.

## 2018-08-27 NOTE — ED Notes (Addendum)
Attempted to call report multiple time to IR at [facility]. Unable to contact someone to give report to at this time. Spoke with doug from [facility] and was given two numbers to try, [phone], and [phone]. Unable to reach someone to provide report to complete emtala. Doug states IR code was not going to be called until [facility] was transferring pt from [facility] with accurate eta, at that time code team will have 30 mis to arrive and therefore no one is currently in the department for IR to receive a report. Reach RN with [facility] was given report at this time. Reach states she would take responsibility for report since she is transferring the patient straight to IR.

## 2018-08-27 NOTE — ED Notes (Signed)
Admitting doctor at bedside 

## 2018-08-27 NOTE — Consult Note (Signed)
NAME:  Darren Frazier, MRN:  [id number], DOB:  1941-08-03, LOS: 0 ADMISSION DATE:  08/27/2018, CONSULTATION DATE: 08/27/2018 REFERRING MD: Interventional radiology, CHIEF COMPLAINT: Right leg weakness  Brief History   Patient 77 year old with acute stroke history of A. fib on Pradaxa presented CT angios showed occlusion went to IR with inability to extract the clot patient is intubated has a femoral sheath patient sedated unable to give a history unable to get history given the generalized anesthesia and severe sedation.  History was obtained from the HPI from the neuro team before the patient was intubated.  History of present illness    This history was obtained by the neurology team primary team for the patient was intubated.   HPI: Darren Frazier is a 77 y.o. male  PMH of HTN, atrial fibrillation on Pradaxa last dose last evening, urethral stricture, cardiomyopathy, peripheral vascular disease, hypertension, hyperlipidemia, stroke with some balance issues, bilateral lymphedema vitamin D deficiency came into [facility] for evaluation of right lower extremity numbness and weakness with last known normal around 8:30 AM today. He was evaluated by telemedicine neurology, given NIH stroke scale of 4 and vascular imaging was recommended.  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  No known history of ICA occlusion.  I was called regarding this patient's potential transfer in IR evaluation.  I recommended getting a CT perfusion study which was done that showed a large penumbra with no core in the left cerebral hemisphere. He was transferred for further work-up and diagnostic angiogram and possible thrombectomy to [facility]    Objective   Pulse 84, resp. rate 16, SpO2 96 %.    Vent Mode: PRVC FiO2 (%):  [40 %] 40 % Set Rate:  [16 bmp] 16 bmp Vt Set:  [560 mL] 560 mL PEEP:  [5 cmH20] 5  cmH20 Plateau Pressure:  [26 cmH20] 26 cmH20   Intake/Output Summary (Last 24 hours) at 08/27/2018 2050 Last data filed at 08/27/2018 1943 Gross per 24 hour  Intake 2700 ml  Output 1160 ml  Net 1540 ml   There were no vitals filed for this visit.  Examination: General: Intubated sedated no acute distress Neuro: Unable to assess intubated sedated no acute distress  HEENT:  atraumatic , no jaundice , dry mucous membranes  Cardiovascular:  Irregular irregular , ESM 2/6 in the aortic area  Lungs:  CTA bilateral , no wheezing or crackles  Abdomen:  Soft lax +BS , no tenderness . Musculoskeletal:  WNL , normal pulses  Skin:  No rash      Assessment & Plan:    --Acute stroke while on Pradaxa history of A. fib mechanical extraction of the clot failed due to tortuous vessels and organized clot.  Will defer to anticoagulation antiplatelets antithrombotic neurology and interventional radiology. --Ventilator support will continue with sedation and assess patient in the morning for possibility of extubation. --We will start the patient Neo-Synephrine to keep systolic blood pressure between 140 and 160. --Patient received a heparin in the OR now he is bleeding around the sheath will check PTT possibility of removing the T-sheet with extra pressure around the site to stop the bleeding.  Best practice:    Labs   CBC: Recent Labs  Lab 08/27/18 1122  WBC 10.9*  HGB 13.0  HCT 39.6  MCV 97.5  PLT 226    Basic Metabolic Panel: Recent Labs  Lab 08/27/18 1122  NA  136  K 3.8  CL 99  CO2 25  GLUCOSE 190*  BUN 21  CREATININE 1.02  CALCIUM 9.4   GFR: Estimated Creatinine Clearance: 83.1 mL/min (by C-G formula based on SCr of 1.02 mg/dL). Recent Labs  Lab 08/27/18 1122  WBC 10.9*    Liver Function Tests: No results for input(s): AST, ALT, ALKPHOS, BILITOT, PROT, ALBUMIN in the last 168 hours. No results for input(s): LIPASE, AMYLASE in the last 168 hours. No results for  input(s): AMMONIA in the last 168 hours.  ABG No results found for: PHART, PCO2ART, PO2ART, HCO3, TCO2, ACIDBASEDEF, O2SAT   Coagulation Profile: No results for input(s): INR, PROTIME in the last 168 hours.  Cardiac Enzymes: No results for input(s): CKTOTAL, CKMB, CKMBINDEX, TROPONINI in the last 168 hours.  HbA1C: No results found for: HGBA1C  CBG: No results for input(s): GLUCAP in the last 168 hours.  Review of Systems:   Unable to obtain due to patient's conditions  Past Medical History  He,  has a past medical history of Cardiomyopathy (HCC), Diabetes mellitus without complication (HCC), Dysrhythmia, GERD (gastroesophageal reflux disease), H/O: urethral stricture, History of abdominal aortic aneurysm (AAA), History of kidney stones (2017), Hyperlipidemia, Hypertension, Hypothyroidism, Morbid obesity with BMI of 40.0-44.9, adult (HCC) (2019), Peripheral vascular disease (HCC) (2019), Stroke (HCC) (2011), Tremors of nervous system, and Vitamin D deficiency.   Surgical History    Past Surgical History:  Procedure Laterality Date  . APPENDECTOMY  1961  . CATARACT EXTRACTION, BILATERAL Bilateral   . COLONOSCOPY    . LOWER EXTREMITY ANGIOGRAPHY Left 03/05/2018   Procedure: LOWER EXTREMITY ANGIOGRAPHY;  Surgeon: Renford DillsSchnier, Gregory G, MD;  Location: [facility] INVASIVE CV LAB;  Service: Cardiovascular;  Laterality: Left;  . TONSILLECTOMY       Social History   reports that he quit smoking about 36 years ago. His smoking use included cigarettes. He has a 120.00 pack-year smoking history. He has never used smokeless tobacco. He reports that he does not drink alcohol or use drugs.   Family History   His family history includes Diabetes in his father.   Allergies Allergies  Allergen Reactions  . Ace Inhibitors Other (See Comments)    unknown  . Omeprazole Diarrhea  . Statins Other (See Comments)    Abnormal lab results(pt unsure of what labs)     Home Medications  Prior to  Admission medications   Medication Sig Start Date End Date Taking? Authorizing Provider  amLODipine (NORVASC) 10 MG tablet Take 10 mg by mouth daily.  02/06/17   [provider]  aspirin 81 MG chewable tablet Chew 81 mg by mouth daily.     [provider]  bisoprolol (ZEBETA) 10 MG tablet Take 10 mg by mouth daily.  05/29/16   [provider]  Calcium Citrate (CITRACAL PO) Take 1 tablet by mouth 2 (two) times daily.    [provider]  Cholecalciferol (VITAMIN D3) 1000 units CAPS Take 1,000 Units by mouth daily.     [provider]  dabigatran (PRADAXA) 150 MG CAPS capsule Take 150 mg by mouth 2 (two) times daily.  11/21/16   [provider]  doxazosin (CARDURA) 2 MG tablet Take 2 mg by mouth daily.  02/06/17   [provider]  famotidine (PEPCID) 20 MG tablet Take 20 mg by mouth 2 (two) times daily.    [provider]  fluticasone (FLONASE) 50 MCG/ACT nasal spray Place 2 sprays into both nostrils daily as needed for allergies  or rhinitis.     [provider]  gabapentin (NEURONTIN) 100 MG capsule Take 200 mg by mouth 2 (two) times daily.  03/05/17   [provider]  hydrochlorothiazide (HYDRODIURIL) 25 MG tablet Take 25 mg by mouth daily.  07/30/12   [provider]  levothyroxine (SYNTHROID, LEVOTHROID) 88 MCG tablet Take 88 mcg by mouth daily before breakfast.  03/22/17   [provider]  losartan (COZAAR) 100 MG tablet Take 100 mg by mouth daily.  07/30/12   [provider]  meloxicam (MOBIC) 15 MG tablet Take 15 mg by mouth daily.     [provider]  metFORMIN (GLUCOPHAGE) 1000 MG tablet Take 1,000 mg by mouth 2 (two) times daily with a meal.  02/06/17   [provider]  Multiple Vitamin (MULTIVITAMIN) capsule Take 1 capsule by mouth daily.     [provider]  Omega-3 Fatty Acids (FISH OIL) 1200 MG CAPS Take 1,200 mg by mouth 3 (three) times daily.      [provider]  ranitidine (ZANTAC) 150 MG tablet Take 150 mg by mouth 2 (two) times daily.  03/22/17   [provider]  vitamin C (ASCORBIC ACID) 500 MG tablet Take 500 mg by mouth daily.    [provider]     Critical care time:

## 2018-08-27 NOTE — ED Notes (Signed)
Pt not transported to MRI due to md request. Test discontinued at this time

## 2018-08-27 NOTE — Progress Notes (Signed)
Patient ID: Darren Frazier, male   DOB: 1941-01-03, 77 y.o.   MRN: [id number] INR. 73 Y RH  LSW ?6 am  RT sided weakness,L>.A. MRSS 1 CT brain No ich ASPECTS ?9. CTA occluded Lt ICA prox. Non obstructive filling defect in Lt MCA M 1 seg. Rapid CTP.No core but Tmax> 6.0s vol 155/l Mismatch . Option of revascularization of probable acute  on chronic symptomatic occlusion of Lt ICA D/W patient. Reasons ,risks,aalternatives reviewed . Risks of ICH of 10 %,worsening neuro deficit,vent dependency,death ,inablity to revascularize reviewed.Questions answered to patients understanding. Informed witnessed consent obtained.[name] S.Kiyoko Mcguirt MD

## 2018-08-27 NOTE — Progress Notes (Signed)
   08/27/18 1300  Clinical Encounter Type  Visited With Patient;Family  Visit Type Initial;Spiritual support;Code (Code Stroke)  Referral From Nurse  Recommendations Follow-up as needed.  Spiritual Encounters  Spiritual Needs Emotional  Stress Factors  Patient Stress Factors Health changes  Family Stress Factors Health changes   Chaplain provided spiritual and pastoral presence during Code Stroke response to the patient and his wife. Chaplain communicated patient concerns to the attending physician and provided active listening and empathy for the family. Chaplain offered to return, if needed.

## 2018-08-27 NOTE — ED Notes (Signed)
Patient transported to MRI 

## 2018-08-27 NOTE — ED Notes (Signed)
Pt and pt's family are stating they are confused on process. Lorrie, RN trying to explain that additional CT was needed to decide on transfer or not.

## 2018-08-27 NOTE — ED Notes (Signed)
This RN able to contact [facility] to inform of patient being transferred from [facility] to IR directly.  Informed unable to give report to IR but have given report to [facility] RN.

## 2018-08-27 NOTE — ED Provider Notes (Addendum)
[facility] Emergency Department Provider Note       Time seen: ----------------------------------------- 11:15 AM on 08/27/2018 -----------------------------------------   I have reviewed the triage vital signs and the nursing notes.  HISTORY   Chief Complaint Weakness (RLE)    HPI Darren Frazier is a 77 y.o. male with a history of cardiomyopathy, diabetes, GERD, hyperlipidemia, hypertension, CVA who presents to the ED for right leg weakness.  Patient also had had some shortness of breath and chest pressure.  He reports history of 2 strokes with limited deficits.  His main complaint today is not being able to move his right leg.  He does describe some decrease sensation on the right side.  Past Medical History:  Diagnosis Date  . Cardiomyopathy (HCC)   . Diabetes mellitus without complication (HCC)   . Dysrhythmia    atrial fibrillation  . GERD (gastroesophageal reflux disease)   . H/O: urethral stricture    self caths occasionally  . History of abdominal aortic aneurysm (AAA)    30/63mm in diameter, stable  . History of kidney stones 2017  . Hyperlipidemia   . Hypertension   . Hypothyroidism   . Morbid obesity with BMI of 40.0-44.9, adult (HCC) 2019  . Peripheral vascular disease (HCC) 2019   lymphadema both extremities, ulceration on left big toe  . Stroke (HCC) 2011   x 2 within 6 months. some numbness over upper arms, balance off  . Tremors of nervous system   . Vitamin D deficiency     Patient Active Problem List   Diagnosis Date Noted  . Essential hypertension 02/20/2018  . Diabetes (HCC) 02/20/2018  . 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 Chronic venous insufficiency 03/31/2017  . Lymphedema 03/31/2017  . Leg pain 03/31/2017    Past Surgical History:  Procedure Laterality Date  . APPENDECTOMY  1961  . CATARACT EXTRACTION, BILATERAL Bilateral   . COLONOSCOPY    . LOWER EXTREMITY ANGIOGRAPHY  Left 03/05/2018   Procedure: LOWER EXTREMITY ANGIOGRAPHY;  Surgeon: Renford Dills, MD;  Location: [facility] INVASIVE CV LAB;  Service: Cardiovascular;  Laterality: Left;  . TONSILLECTOMY      Allergies Ace inhibitors; Omeprazole; and Statins  Social History Social History   Tobacco Use  . Smoking status: Former Smoker    Packs/day: 4.00    Years: 30.00    Pack years: 120.00    Types: Cigarettes    Last attempt to quit: 1984    Years since quitting: 36.0  . Smokeless tobacco: Never Used  Substance Use Topics  . Alcohol use: No    Comment: occasional beer. couple years ago  . Drug use: No   Review of Systems Constitutional: Negative for fever. Cardiovascular: Positive for chest pain Respiratory: Shortness of breath Gastrointestinal: Negative for abdominal pain, vomiting and diarrhea. Musculoskeletal: Negative for back pain. Skin: Negative for rash. Neurological: Positive for right leg weakness, decreased sensation  All systems negative/normal/unremarkable except as stated in the HPI  ____________________________________________   PHYSICAL EXAM:  VITAL SIGNS: ED Triage Vitals [08/27/18 1112]  Enc Vitals Group     BP      Pulse      Resp      Temp      Temp src      SpO2 95 %     Weight      Height      Head Circumference      Peak Flow  Pain Score      Pain Loc      Pain Edu?      Excl. in GC?    Constitutional: Alert and oriented.  Chronically ill-appearing, no distress Eyes: Conjunctivae are normal. Normal extraocular movements. ENT   Head: Normocephalic and atraumatic.   Nose: No congestion/rhinnorhea.   Mouth/Throat: Mucous membranes are moist.   Neck: No stridor. Cardiovascular: Normal rate, regular rhythm. No murmurs, rubs, or gallops. Respiratory: Mild tachypnea with clear breath sounds Gastrointestinal: Soft and nontender. Normal bowel sounds Musculoskeletal: Nontender with normal range of motion in extremities. No lower  extremity tenderness nor edema. Neurologic:  Normal speech and language.  Strength is diminished in the right leg compared to left, paresthesias are noted in the right leg and right arm compared to left.  Cranial nerves are intact. Skin:  Skin is warm, dry and intact. No rash noted. Psychiatric: Mood and affect are normal. Speech and behavior are normal.  ____________________________________________  EKG: Interpreted by me.  Atrial fibrillation with a rate of 91 bpm, right bundle branch block, left posterior fascicular block.  ____________________________________________  ED COURSE:  As part of my medical decision making, I reviewed the following data within the electronic MEDICAL RECORD NUMBER History obtained from family if available, nursing notes, old chart and ekg, as well as notes from prior ED visits. Patient presented for acute stroke symptoms, we will assess with labs and imaging as indicated at this time.   Procedures ____________________________________________   LABS (pertinent positives/negatives)  Labs Reviewed  BASIC METABOLIC PANEL - Abnormal; Notable for the following components:      Result Value   Glucose, Bld 190 (*)    All other components within normal limits  CBC - Abnormal; Notable for the following components:   WBC 10.9 (*)    RBC 4.06 (*)    All other components within normal limits  URINALYSIS, COMPLETE (UACMP) WITH MICROSCOPIC  CBG MONITORING, ED    RADIOLOGY Images were viewed by me  CT head IMPRESSION: 1. No acute finding by CT. 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 2. 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 No acute finding is suspected by CT. 3. These results were called by telephone at the time of interpretation on 08/27/2018 at 11:47 am to Dr. Daryel NovemberJONATHAN Jahnyla Parrillo , who verbally acknowledged these  results. ____________________________________________  CRITICAL CARE Performed by: Ulice DashJohnathan E Alexsander Cavins   Total critical care time: 30 minutes  Critical care time was exclusive of separately billable procedures and treating other patients.  Critical care was necessary to treat or prevent imminent or life-threatening deterioration.  Critical care was time spent personally by me on the following activities: development of treatment plan with patient and/or surrogate as well as nursing, discussions with consultants, evaluation of patient's response to treatment, examination of patient, obtaining history from patient or surrogate, ordering and performing treatments and interventions, ordering and review of laboratory studies, ordering and review of radiographic studies, pulse oximetry and re-evaluation of patient's condition.  DIFFERENTIAL DIAGNOSIS   CVA, TIA, dehydration, electrolyte abnormality, occult infection  FINAL ASSESSMENT AND PLAN  CVA   Plan: The patient had presented for right leg weakness. Patient's labs did not indicate any acute process. Patient's imaging was concerning for old infarctions with likely new ischemic symptoms already on Pradaxa.  I discussed with tele-neurology who has arranged for CT angiogram of the head and neck.  He would likely  need inpatient admission and perhaps different anticoagulation.   Patient had a CT angiogram of the head and neck which revealed a believed acute left ICA occlusion.  I was not informed of this initially.  He will likely be transferred to [facility] for neuro interventional radiology.   Ulice Dash, MD   Note: This note was generated in part or whole with voice recognition software. Voice recognition is usually quite accurate but there are transcription errors that can and very often do occur. I apologize for any typographical errors that were not detected and corrected.     Emily Filbert,  MD 08/27/18 [id number]    Emily Filbert, MD 08/27/18 [phone]

## 2018-08-27 NOTE — ED Notes (Signed)
Nurse spoke with Byrd Hesselbach, RN that works with neurologist. This nurse is to contact if any new findings are observed. [phone]

## 2018-08-27 NOTE — Anesthesia Preprocedure Evaluation (Signed)
Anesthesia Evaluation  Patient identified by MRN, date of birth, ID band Patient awake    Reviewed: Allergy & Precautions, NPO status , Patient's Chart, lab work & pertinent test resultsPreop documentation limited or incomplete due to emergent nature of procedure.  Airway        Dental   Pulmonary former smoker,           Cardiovascular hypertension, + Peripheral Vascular Disease  + dysrhythmias      Neuro/Psych    GI/Hepatic GERD  ,  Endo/Other  diabetesHypothyroidism   Renal/GU      Musculoskeletal   Abdominal   Peds  Hematology   Anesthesia Other Findings   Reproductive/Obstetrics                             Anesthesia Physical Anesthesia Plan  ASA: IV  Anesthesia Plan: General   Post-op Pain Management:    Induction: Intravenous, Cricoid pressure planned and Rapid sequence  PONV Risk Score and Plan: Treatment may vary due to age or medical condition  Airway Management Planned: Oral ETT  Additional Equipment:   Intra-op Plan:   Post-operative Plan: Possible Post-op intubation/ventilation  Informed Consent: I have reviewed the patients History and Physical, chart, labs and discussed the procedure including the risks, benefits and alternatives for the proposed anesthesia with the patient or authorized representative who has indicated his/her understanding and acceptance.   Dental advisory given  Plan Discussed with: CRNA, Anesthesiologist and Surgeon  Anesthesia Plan Comments:         Anesthesia Quick Evaluation

## 2018-08-27 NOTE — Anesthesia Procedure Notes (Signed)
Procedure Name: Intubation Date/Time: 08/27/2018 5:22 PM Performed by: Kaesha Kirsch T, CRNA Pre-anesthesia Checklist: Patient identified, Emergency Drugs available, Suction available and Patient being monitored Patient Re-evaluated:Patient Re-evaluated prior to induction Oxygen Delivery Method: Circle system utilized Preoxygenation: Pre-oxygenation with 100% oxygen Induction Type: IV induction and Rapid sequence Ventilation: Mask ventilation without difficulty Laryngoscope Size: Glidescope and 4 Grade View: Grade I Tube type: Oral Tube size: 8.0 mm Number of attempts: 1 Airway Equipment and Method: Patient positioned with wedge pillow,  Stylet and Video-laryngoscopy Placement Confirmation: ETT inserted through vocal cords under direct vision,  positive ETCO2 and breath sounds checked- equal and bilateral Secured at: 24 cm Tube secured with: Tape Dental Injury: Teeth and Oropharynx as per pre-operative assessment

## 2018-08-27 NOTE — ED Notes (Signed)
Patient transported to CT 

## 2018-08-27 NOTE — ED Notes (Signed)
Nurse attempted to call Byrd Hesselbach, RN back but no answer to phone number listed.

## 2018-08-27 NOTE — Procedures (Signed)
S/P bilateral commonn carotid and Lt VA arteriogram followed by attempted revascularization of  Symptomatic acute occlusion of Lt ICa prox. 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 3.Severte stenosis of dominant LT VBJ distal to Lt PICA  .4.Occluded RT VA prox.

## 2018-08-27 NOTE — ED Provider Notes (Signed)
Patient has been accepted in transfer to [facility] interventional neurology Dr. Wilford Corner.   Emily Filbert, MD 08/27/18 [phone]

## 2018-08-27 NOTE — ED Notes (Signed)
Pt returned from CT. Pt is on the with the neurologist currently

## 2018-08-27 NOTE — H&P (Signed)
STROKE H&P  CC: rt leg weakness  History is obtained from:patient, chart  HPI: Darren Frazier is a 77 y.o. male  PMH of HTN, atrial fibrillation on Pradaxa last dose last evening, urethral stricture, cardiomyopathy, peripheral vascular disease, hypertension, hyperlipidemia, stroke with some balance issues, bilateral lymphedema vitamin D deficiency came into [facility] for evaluation of right lower extremity numbness and weakness with last known normal around 8:30 AM today. He was evaluated by telemedicine neurology, given NIH stroke scale of 4 and vascular imaging was recommended.  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  No known history of ICA occlusion.  I was called regarding this patient's potential transfer in IR evaluation.  I recommended getting a CT perfusion study which was done that showed a large penumbra with no core in the left cerebral hemisphere. He was transferred for further work-up and diagnostic angiogram and possible thrombectomy to [facility]   LKW: 8:30 AM on 08/27/2018 tpa given?: no, on Pradaxa last dose last evening Premorbid modified Rankin scale (mRS): 2 from peripheral vascular disease  ROS:  ROS was performed and is negative except as noted in the HPI.   Past Medical History:  Diagnosis Date  . Cardiomyopathy (HCC)   . Diabetes mellitus without complication (HCC)   . Dysrhythmia    atrial fibrillation  . GERD (gastroesophageal reflux disease)   . H/O: urethral stricture    self caths occasionally  . History of abdominal aortic aneurysm (AAA)    30/11mm in diameter, stable  . History of kidney stones 2017  . Hyperlipidemia   . Hypertension   . Hypothyroidism   . Morbid obesity with BMI of 40.0-44.9, adult (HCC) 2019  . Peripheral vascular disease (HCC) 2019   lymphadema both extremities, ulceration on left big toe  . Stroke (HCC) 2011   x 2 within 6 months.  some numbness over upper arms, balance off  . Tremors of nervous system   . Vitamin D deficiency      Family History  Problem Relation Age of Onset  . Diabetes Father     Social History:   reports that he quit smoking about 36 years ago. His smoking use included cigarettes. He has a 120.00 pack-year smoking history. He has never used smokeless tobacco. He reports that he does not drink alcohol or use drugs.   Medications  Current Facility-Administered Medications:  .   stroke: mapping our early stages of recovery book, , Does not apply, Once, Milon Dikes, MD .  0.9 %  sodium chloride infusion, , Intravenous, Continuous, Milon Dikes, MD .  acetaminophen (TYLENOL) tablet 650 mg, 650 mg, Oral, Q4H PRN **OR** acetaminophen (TYLENOL) solution 650 mg, 650 mg, Per Tube, Q4H PRN **OR** acetaminophen (TYLENOL) suppository 650 mg, 650 mg, Rectal, Q4H PRN, Milon Dikes, MD .  senna-docusate (Senokot-S) tablet 1 tablet, 1 tablet, Oral, QHS PRN, Milon Dikes, MD  Facility-Administered Medications Ordered in Other Encounters:  .  aspirin 325 MG tablet, , , ,  .  clopidogrel (PLAVIX) 300 MG tablet, , , ,  .  eptifibatide (INTEGRILIN) 20 MG/10ML injection, , , ,  .  lidocaine (XYLOCAINE) 1 % (with pres) injection, , , ,  .  nitroGLYCERIN 100 mcg/mL intra-arterial injection, , , ,  .  ticagrelor (BRILINTA) 90 MG tablet, , , ,  .  tirofiban (AGGRASTAT) 5-0.9 MG/100ML-% injection, , , ,    Exam: Current vital signs: There  were no vitals taken for this visit. Vital signs in last 24 hours: Temp:  [97.9 F (36.6 C)] 97.9 F (36.6 C) (12/25 1116) Pulse Rate:  [81-90] 81 (12/25 1626) Resp:  [20-27] 20 (12/25 1430) BP: (141-151)/(77-92) 151/92 (12/25 1430) SpO2:  [95 %-100 %] 97 % (12/25 1626) Weight:  [136.1 kg] 136.1 kg (12/25 1117) General: Awake alert in no distress HEENT: Cephalic atraumatic Lungs: Clear to auscultation Cardiovascular: S1-2 heard regular rhythm Abdomen: Soft  nondistended nontender Extremities: Warm, 2+ pitting edema both lower extremities with redness up to midcalf. Neurological exam Patient awake alert oriented x3. His speech is mildly guttural- possibly dysarthric Naming comprehension repetition is intact. Cranial nerves: Pupils equal round react light, extraocular movements intact, visual fields full, face is symmetric, auditory acuity intact, palate elevates midline, tongue midline. Motor exam: 5/5 right upper extremity.  1-2/5 right lower extremity that cannot do antigravity but has some movement, left upper and lower extremity are 5/5. Sensory exam: Intact light touch with the exception of on the right lower extremity No extinction Coordination: Intact finger-nose-finger Unable to perform emissions  Labs I have reviewed labs in epic and the results pertinent to this consultation are: CBC    Component Value Date/Time   WBC 10.9 (H) 08/27/2018 1122   RBC 4.06 (L) 08/27/2018 1122   HGB 13.0 08/27/2018 1122   HGB 14.2 03/07/2013 1849   HCT 39.6 08/27/2018 1122   HCT 41.5 03/07/2013 1849   PLT 226 08/27/2018 1122   PLT 212 03/07/2013 1849   MCV 97.5 08/27/2018 1122   MCV 99 03/07/2013 1849   MCH 32.0 08/27/2018 1122   MCHC 32.8 08/27/2018 1122   RDW 13.5 08/27/2018 1122   RDW 13.2 03/07/2013 1849    CMP     Component Value Date/Time   NA 136 08/27/2018 1122   NA 137 03/07/2013 1849   K 3.8 08/27/2018 1122   K 3.6 03/07/2013 1849   CL 99 08/27/2018 1122   CL 101 03/07/2013 1849   CO2 25 08/27/2018 1122   CO2 26 03/07/2013 1849   GLUCOSE 190 (H) 08/27/2018 1122   GLUCOSE 135 (H) 03/07/2013 1849   BUN 21 08/27/2018 1122   BUN 18 03/07/2013 1849   CREATININE 1.02 08/27/2018 1122   CREATININE 1.06 03/07/2013 1849   CALCIUM 9.4 08/27/2018 1122   CALCIUM 9.4 03/07/2013 1849   PROT 7.6 03/07/2013 1849   ALBUMIN 3.9 03/07/2013 1849   AST 51 (H) 03/07/2013 1849   ALT 56 03/07/2013 1849   ALKPHOS 89 03/07/2013 1849    BILITOT 0.3 03/07/2013 1849   GFRNONAA >60 08/27/2018 1122   GFRNONAA >60 03/07/2013 1849   GFRAA >60 08/27/2018 1122   GFRAA >60 03/07/2013 1849    Imaging I have reviewed the images obtained:  CT-scan of the brain-no acute changes CTA head and neck-left ICA occlusion with possible left M1 thrombus. CT perfusion with 155 cc of delayed perfusion in the left MCA with no cord infarction.   Assessment:  77 year old man with multiple cerebrovascular risk factors presenting for sudden onset of right lower extremity numbness and weakness with last known normal 8:30 AM. Unable to receive TPA due to being on Pradaxa for his atrial fibrillation. NIH stroke scale-4. CTA and perfusion shows left ICA occlusion with perfusion showing large area of penumbra with no core infarct. Case discussed with endovascular. Found to be a candidate for endovascular and brought in to the IR suite emergently from [facility]. After the interventional procedure,  will be taken to ICU.  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 If not reperfused - allow permissive HTN. -MRI/ECHO/A1C/Lipid panel. -Hyperglycemia management per SSI to maintain glucose 140-180mg /dL. -PT/OT/ST therapies and recommendations when able  CNS -Close neuro monitoring -NPO  Hemiplegia and hemiparesis following cerebral infarction affecting right dominant side -PT/OT -PM&R consult  RESP Will be intubated for IR Extubate when able. Appreciate PCCM consult for vent  CV Essential (primary) hypertension -Aggressive BP control -parameters depend on IR  procdure results -Titrate oral agents  Hyperlipidemia, unspecified  - Statin for goal LDL < 70  Chronic atrial fibrillation Hold pradaxa  HEME -Monitor -transfuse for hgb < 7  Coagulopathy secondary to anticoagulation -d/c praxada  ENDO Type 2 diabetes mellitus with hyperglycemia  -SSI -goal HgbA1c < 7  GI/GU -Gentle hydration  Fluid/Electrolyte Disorders -Replete -Repeat labs  ID Possible Aspiration PNA -CXR -NPO -Monitor  Prophylaxis DVT: scd   GI: per vent bundle Bowel - doc senna  Diet: NPO until cleared by speech  Code Status: Full Code     THE FOLLOWING WERE PRESENT ON ADMISSION: hemiparesis, PVD, DM with hyperglycemia, lymphedema. essential HTN, Afib on anticoagulation  -- Milon Dikes, MD [facility] Pager: [phone] If 7pm to 7am, please call on call as listed on AMION.  CRITICAL CARE ATTESTATION Performed by: Milon Dikes, MD Total critical care time: 45 minutes Critical care time was exclusive of separately billable procedures and treating other patients and/or supervising APPs/Residents/Students Critical care was necessary to treat or prevent imminent or life-threatening deterioration due to stroke, carotid occlusion This patient is critically ill and at significant risk for neurological worsening and/or death and care requires constant monitoring. Critical care was time spent personally by me on the following activities: development of treatment plan with patient and/or surrogate as well as nursing, discussions with consultants, evaluation of patient's response to treatment, examination of patient, obtaining history from patient or surrogate, ordering and performing treatments and interventions, ordering and review of laboratory studies, ordering and review of radiographic studies, pulse oximetry, re-evaluation of patient's condition, participation in multidisciplinary rounds and medical decision making of high complexity in the  care of this patient.

## 2018-08-27 NOTE — Progress Notes (Signed)
RT obtained ABG from pt Darren Frazier. Pt current FIO2 is 40% on PRVC vent mode. Pt results were as follows:  PH 7.36 PCO2 45 PaO2 99 HCO3 26  RT will continue to monitor.

## 2018-08-27 NOTE — ED Notes (Signed)
EMTALA reviewed by this RN.  

## 2018-08-27 NOTE — Consult Note (Signed)
CODE STROKE- PHARMACY COMMUNICATION   Time CODE STROKE called/page received:[id number]  Time response to CODE STROKE was made (in person or via phone): 1146  Time Stroke Kit retrieved from [place] (only if needed):N/A   (pradaxa pt - neurology advised against tpa)  Name of Provider/Nurse contacted:Olivia  Past Medical History:  Diagnosis Date  . Cardiomyopathy ([place])   . Diabetes mellitus without complication ([place])   . Dysrhythmia    atrial fibrillation  . GERD (gastroesophageal reflux disease)   . H/O: urethral stricture    self caths occasionally  . History of abdominal aortic aneurysm (AAA)    30/88m in diameter, stable  . History of kidney stones 2017  . Hyperlipidemia   . Hypertension   . Hypothyroidism   . Morbid obesity with BMI of 40.0-44.9, adult ([place] 2019  . Peripheral vascular disease ([place] 2019   lymphadema both extremities, ulceration on left big toe  . [place]   x 2 within 6 months. some numbness over upper arms, balance off  . Tremors of nervous system   . Vitamin D deficiency    Prior to Admission medications   Medication Sig Start Date End Date Taking? Authorizing Provider  amLODipine (NORVASC) 10 MG tablet Take 10 mg by mouth daily.  02/06/17   [provider]  aspirin 81 MG chewable tablet Chew by mouth daily.    [provider]  bisoprolol (ZEBETA) 10 MG tablet Take 10 mg by mouth daily.  05/29/16   [provider]  Calcium Citrate (CITRACAL PO) Take 1 tablet by mouth 2 (two) times daily.    [provider]  Cholecalciferol (VITAMIN D3) 1000 units CAPS Take 1,000 Units by mouth daily.     [provider]  dabigatran (PRADAXA) 150 MG CAPS capsule Take 150 mg by mouth 2 (two) times daily.  11/21/16   [provider]  doxazosin (CARDURA) 2 MG tablet Take 2 mg by mouth daily.  02/06/17   [provider]  fluticasone (Asencion Islam 50 MCG/ACT nasal spray 2 sprays in each twice daily as needed 07/30/12    [provider]  gabapentin (NEURONTIN) 100 MG capsule Take 200 mg by mouth 2 (two) times daily.  03/05/17   [provider]  hydrochlorothiazide (HYDRODIURIL) 25 MG tablet Take 25 mg by mouth daily.  07/30/12   [provider]  ketoconazole (NIZORAL) 2 % shampoo  05/08/18   [provider]  levothyroxine (SYNTHROID, LEVOTHROID) 88 MCG tablet Take 88 mcg by mouth daily before breakfast.  03/22/17   [provider]  losartan (COZAAR) 100 MG tablet Take 100 mg by mouth daily.  07/30/12   [provider]  meloxicam (MOBIC) 15 MG tablet Take 15 mg by mouth daily.     [provider]  metFORMIN (GLUCOPHAGE) 1000 MG tablet Take 1,000 mg by mouth 2 (two) times daily with a meal.  02/06/17   [provider]  Multiple Vitamin (MULTIVITAMIN) capsule Take 1 capsule by mouth daily.     [provider]  Omega-3 Fatty Acids (FISH OIL) 1200 MG CAPS Take 1,200 mg by mouth 3 (three) times daily.     [provider]  ranitidine (ZANTAC) 150 MG tablet Take 150 mg by mouth 2 (two) times daily.  03/22/17   [provider]  vitamin C (ASCORBIC ACID) 500 MG tablet Take 500 mg by mouth daily.    [provider]   CLu Duffel PharmD, BCPS Clinical Pharmacist 08/27/2018 11:46 AM

## 2018-08-27 NOTE — Progress Notes (Signed)
Sheath pulled at 23:23 by Carney Corners, RN & Kristian Covey, RN. Pressure held for 30 minutes. Level 1 above site prior to sheath removal and after. Vital signs stable throughout. RN will continue to monitor.   Rochele Lueck E Mylinda Latina, [place]

## 2018-08-27 NOTE — Anesthesia Postprocedure Evaluation (Signed)
Anesthesia Post Note  Patient: Darren Frazier  Procedure(s) Performed: IR WITH ANESTHESIA (N/A )     Patient location during evaluation: ICU Anesthesia Type: General Level of consciousness: patient remains intubated per anesthesia plan Pain management: pain level controlled Vital Signs Assessment: post-procedure vital signs reviewed and stable Respiratory status: patient remains intubated per anesthesia plan Cardiovascular status: stable Postop Assessment: no apparent nausea or vomiting Anesthetic complications: no    Last Vitals:  Vitals:   08/27/18 2013 08/27/18 2100  Pulse: 84   Resp: 16   Temp:  36.4 C  SpO2: 96%     Last Pain:  Vitals:   08/27/18 2100  TempSrc: Axillary                 Lambros Cerro

## 2018-08-27 NOTE — Anesthesia Procedure Notes (Signed)
Arterial Line Insertion Performed by: Gwenyth Allegra, CRNA, CRNA  Preanesthetic checklist: patient identified, risks and benefits discussed, monitors and equipment checked and pre-op evaluation radial was placed Catheter size: 20 G Hand hygiene performed  and maximum sterile barriers used   Attempts: 1 Procedure performed without using ultrasound guided technique. Following insertion, dressing applied and Biopatch. Patient tolerated the procedure well with no immediate complications.

## 2018-08-27 NOTE — H&P (Signed)
[facility] - [place] at [facility]   PATIENT NAME: Darren Frazier    MR#:  [id number]  DATE OF BIRTH:  11/17/1940  DATE OF ADMISSION:  08/27/2018  PRIMARY CARE PHYSICIAN: Lynnea Ferrier, MD   REQUESTING/REFERRING PHYSICIAN: Dr. Daryel November  CHIEF COMPLAINT: Right-sided weakness   Chief Complaint  Patient presents with  . Weakness    RLE    HISTORY OF PRESENT ILLNESS:  [name]  is a 77 y.o. male with a known history of essential hypertension, diabetes mellitus type 2, chronic A. fib, chronic lymphedema, GERD comes in because of sudden onset of right-sided weakness started this morning, patient did not get TPA because of Pradaxa.  Telemetry neurologist consulted, recommended full stroke work-up.   NIH stroke scale of 4.  Does not have blurred vision, no weakness on the left side.  Wife mentioned that 3 days ago he had some slurred speech vision which resolved on its own within 2- 3 minutes.  PAST MEDICAL HISTORY:   Past Medical History:  Diagnosis Date  . Cardiomyopathy (HCC)   . Diabetes mellitus without complication (HCC)   . Dysrhythmia    atrial fibrillation  . GERD (gastroesophageal reflux disease)   . H/O: urethral stricture    self caths occasionally  . History of abdominal aortic aneurysm (AAA)    30/49mm in diameter, stable  . History of kidney stones 2017  . Hyperlipidemia   . Hypertension   . Hypothyroidism   . Morbid obesity with BMI of 40.0-44.9, adult (HCC) 2019  . Peripheral vascular disease (HCC) 2019   lymphadema both extremities, ulceration on left big toe  . Stroke (HCC) 2011   x 2 within 6 months. some numbness over upper arms, balance off  . Tremors of nervous system   . Vitamin D deficiency     PAST SURGICAL HISTOIRY:   Past Surgical History:  Procedure Laterality Date  . APPENDECTOMY  1961  . CATARACT EXTRACTION, BILATERAL Bilateral   . COLONOSCOPY    . LOWER EXTREMITY ANGIOGRAPHY Left 03/05/2018    Procedure: LOWER EXTREMITY ANGIOGRAPHY;  Surgeon: Renford Dills, MD;  Location: [facility] INVASIVE CV LAB;  Service: Cardiovascular;  Laterality: Left;  . TONSILLECTOMY      SOCIAL HISTORY:   Social History   Tobacco Use  . Smoking status: Former Smoker    Packs/day: 4.00    Years: 30.00    Pack years: 120.00    Types: Cigarettes    Last attempt to quit: 1984    Years since quitting: 36.0  . Smokeless tobacco: Never Used  Substance Use Topics  . Alcohol use: No    Comment: occasional beer. couple years ago    FAMILY HISTORY:   Family History  Problem Relation Age of Onset  . Diabetes Father     DRUG ALLERGIES:   Allergies  Allergen Reactions  . Ace Inhibitors Other (See Comments)    unknown  . Omeprazole Diarrhea  . Statins Other (See Comments)    Abnormal lab results(pt unsure of what labs)    REVIEW OF SYSTEMS:  CONSTITUTIONAL: No fever, fatigue or weakness.  EYES: No blurred or double vision.  EARS, NOSE, AND THROAT: No tinnitus or ear pain.  RESPIRATORY: No cough, shortness of breath, wheezing or hemoptysis.  CARDIOVASCULAR: No chest pain, orthopnea, edema.  GASTROINTESTINAL: No nausea, vomiting, diarrhea or abdominal pain.  GENITOURINARY: No dysuria, hematuria.  ENDOCRINE: No polyuria, nocturia,  HEMATOLOGY: No anemia, easy bruising or bleeding  SKIN: No rash or lesion. MUSCULOSKELETAL: No joint pain or arthritis.   NEUROLOGIC: No tingling, numbness, weakness.  PSYCHIATRY: No anxiety or depression.   MEDICATIONS AT HOME:   Prior to Admission medications   Medication Sig Start Date End Date Taking? Authorizing Provider  amLODipine (NORVASC) 10 MG tablet Take 10 mg by mouth daily.  02/06/17  Yes [provider]  aspirin 81 MG chewable tablet Chew 81 mg by mouth daily.    Yes [provider]  bisoprolol (ZEBETA) 10 MG tablet Take 10 mg by mouth daily.  05/29/16  Yes [provider]  Calcium Citrate (CITRACAL PO) Take 1 tablet by  mouth 2 (two) times daily.   Yes [provider]  Cholecalciferol (VITAMIN D3) 1000 units CAPS Take 1,000 Units by mouth daily.    Yes [provider]  dabigatran (PRADAXA) 150 MG CAPS capsule Take 150 mg by mouth 2 (two) times daily.  11/21/16  Yes [provider]  doxazosin (CARDURA) 2 MG tablet Take 2 mg by mouth daily.  02/06/17  Yes [provider]  famotidine (PEPCID) 20 MG tablet Take 20 mg by mouth 2 (two) times daily.   Yes [provider]  fluticasone (FLONASE) 50 MCG/ACT nasal spray Place 2 sprays into both nostrils daily as needed for allergies or rhinitis.    Yes [provider]  gabapentin (NEURONTIN) 100 MG capsule Take 200 mg by mouth 2 (two) times daily.  03/05/17  Yes [provider]  hydrochlorothiazide (HYDRODIURIL) 25 MG tablet Take 25 mg by mouth daily.  07/30/12  Yes [provider]  levothyroxine (SYNTHROID, LEVOTHROID) 88 MCG tablet Take 88 mcg by mouth daily before breakfast.  03/22/17  Yes [provider]  losartan (COZAAR) 100 MG tablet Take 100 mg by mouth daily.  07/30/12  Yes [provider]  meloxicam (MOBIC) 15 MG tablet Take 15 mg by mouth daily.    Yes [provider]  metFORMIN (GLUCOPHAGE) 1000 MG tablet Take 1,000 mg by mouth 2 (two) times daily with a meal.  02/06/17  Yes [provider]  Multiple Vitamin (MULTIVITAMIN) capsule Take 1 capsule by mouth daily.    Yes [provider]  Omega-3 Fatty Acids (FISH OIL) 1200 MG CAPS Take 1,200 mg by mouth 3 (three) times daily.    Yes [provider]  vitamin C (ASCORBIC ACID) 500 MG tablet Take 500 mg by mouth daily.   Yes [provider]  ranitidine (ZANTAC) 150 MG tablet Take 150 mg by mouth 2 (two) times daily.  03/22/17   [provider]      VITAL SIGNS:  Blood pressure (!) 141/77, pulse 90, temperature 97.9 F (36.6 C), temperature source Oral, resp. rate (!) 27, height 5\' 9"   (1.753 m), weight 136.1 kg, SpO2 96 %.  PHYSICAL EXAMINATION:  GENERAL:  77 y.o.-year-old patient lying in the bed with no acute distress.  EYES: Pupils equal, round, reactive to light and accommodation. No scleral icterus. Extraocular muscles intact.  HEENT: Head atraumatic, normocephalic. Oropharynx and nasopharynx clear.  NECK:  Supple, no jugular venous distention. No thyroid enlargement, no tenderness.  LUNGS: Normal breath sounds bilaterally, no wheezing, rales,rhonchi or crepitation. No use of accessory muscles of respiration.  CARDIOVASCULAR: S1, S2 normal. No murmurs, rubs, or gallops.  ABDOMEN: Soft, nontender, nondistended. Bowel sounds present. No organomegaly or mass.  EXTREMITIES bilateral pedal edema. NEUROLOGIC: Patient.  Weakness in the right leg, right arm and the power is 3/5 on the  right side, 5 / 5 left upper, lower extremity.. Sensations are intact,  PSYCHIATRIC: The patient is alert and oriented x 3.  SKIN: No obvious rash, lesion, or ulcer.   LABORATORY PANEL:   CBC Recent Labs  Lab 08/27/18 1122  WBC 10.9*  HGB 13.0  HCT 39.6  PLT 226   ------------------------------------------------------------------------------------------------------------------  Chemistries  Recent Labs  Lab 08/27/18 1122  NA 136  K 3.8  CL 99  CO2 25  GLUCOSE 190*  BUN 21  CREATININE 1.02  CALCIUM 9.4   ------------------------------------------------------------------------------------------------------------------  Cardiac Enzymes No results for input(s): TROPONINI in the last 168 hours. ------------------------------------------------------------------------------------------------------------------  RADIOLOGY:  Ct Angio Head W Or Wo Contrast  Result Date: 08/27/2018 CLINICAL DATA:  Acute presentation with speech disturbance. EXAM: CT ANGIOGRAPHY HEAD AND NECK TECHNIQUE: Multidetector CT imaging of the head and neck was performed using the standard protocol during  bolus administration of intravenous contrast. Multiplanar CT image reconstructions and MIPs were obtained to evaluate the vascular anatomy. Carotid stenosis measurements (when applicable) are obtained utilizing NASCET criteria, using the distal internal carotid diameter as the denominator. CONTRAST:  75mL ISOVUE-370 IOPAMIDOL (ISOVUE-370) INJECTION 76% COMPARISON:  CT same day. FINDINGS: CTA NECK FINDINGS Aortic arch: Aortic atherosclerosis.  No aneurysm or dissection. Right carotid system: Common carotid artery shows some atherosclerotic plaque but is widely patent to the bifurcation. There is soft and calcified plaque affecting the carotid bifurcation and ICA bulb region. There is severe stenosis in the distal bulb region with luminal diameter of 1 mm. The vessel is quite tortuous beyond that but widely patent to the upper cervical region, where there is soft and calcified plaque resulting in minimal diameter of 3.5 mm. Compared to an expected diameter of 5 mm, stenosis in the bulb is 80% or greater. Stenosis in the upper cervical region is 30%. Left carotid system: Common carotid artery shows atherosclerotic plaque but is sufficiently patent to the bifurcation region. There is calcified and soft plaque at the carotid bifurcation and ICA bulb. There is left internal carotid artery occlusion at the distal bulb. 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 The left vertebral artery shows 50% stenosis at its origin but is sufficiently patent beyond that through the cervical region to the foramen magnum. Skeleton: Mid cervical spondylosis. Other neck: No mass or lymphadenopathy. Upper chest: Interstitial prominence which could go along with fluid overload or mild edema. Review of the MIP images confirms the above findings CTA HEAD FINDINGS Anterior circulation: Left internal carotid artery is occluded without antegrade flow through the skull base. Right internal carotid artery shows  atherosclerotic disease in the carotid siphon region with stenosis estimated at 50-70%. The anterior and middle cerebral vessels are patent. There is atherosclerotic irregularity of the M1 segment with stenosis of 50-70%. No acute vessel occlusion is identified. There is a chronic punctate calcified embolus in 1 of the insular branches, but this was present in 2014. On the left, there is reconstituted flow in the distal siphon. Severe stenosis of the supraclinoid ICA, 80% or greater. Flow is present in the anterior and middle cerebral vessels, secondary to reconstituted flow and flow through communicating arteries. There is 50% stenosis in the M1 segment. I do not see any occluded or missing large or medium vessels in the MCA territory. Posterior circulation: Right vertebral artery shows no antegrade flow at the foramen magnum. There is retrograde flow in the distal right vertebral. Left vertebral artery is patent at the foramen magnum. There  is atherosclerotic disease of the V4 segment with stenosis estimated at 70%. There are serial stenoses. The vessel does show flow to the basilar, which shows atherosclerotic irregularity but is patent. Flow is present in the superior cerebellar and posterior cerebral arteries. Venous sinuses: Patent and normal. Anatomic variants: None significant. Delayed phase: No abnormal enhancement. 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 Severe irregular atherosclerotic disease of the right carotid bifurcation with stenosis of 80% in the bulb region and 30% in the distal ICA. 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 50% stenosis of the left vertebral artery origin. Severe atherosclerotic disease in both carotid siphon regions. Right siphon stenosis estimated at 50-70%. Reconstituted flow in the distal left siphon and supraclinoid region. 80% supraclinoid stenosis. Flow in both anterior and middle  cerebral artery territories. Stenosis of both M1 segments estimated at 50-70%. I do not identify any large or medium vessel occlusion acutely within the MCA branch vessels. Severely disease left V4 segment with serial stenoses proximal to the basilar estimated at 70%. Basilar atherosclerotic irregularity without flow limiting stenosis. Posterior circulation branch vessels do show flow, including right PICA which is supplied by retrograde flow in the distal right vertebral artery. Electronically Signed   By: Paulina Fusi M.D.   On: 08/27/2018 13:14   Dg Chest 2 View  Result Date: 08/27/2018 CLINICAL DATA:  Chest pain and shortness of breath.  Cardiomyopathy. EXAM: CHEST - 2 VIEW COMPARISON:  03/07/2013 FINDINGS: Mild cardiomegaly. Aortic atherosclerosis. Diffuse interstitial infiltrates, consistent with pulmonary edema. Mild subsegmental atelectasis also seen in the left upper lobe. No evidence of pulmonary consolidation or significant pleural effusion. IMPRESSION: Mild cardiomegaly and diffuse interstitial edema, consistent with congestive heart failure. Electronically Signed   By: Myles Rosenthal M.D.   On: 08/27/2018 12:02   Ct Angio Neck W Or Wo Contrast  Result Date: 08/27/2018 CLINICAL DATA:  Acute presentation with speech disturbance. EXAM: CT ANGIOGRAPHY HEAD AND NECK TECHNIQUE: Multidetector CT imaging of the head and neck was performed using the standard protocol during bolus administration of intravenous contrast. Multiplanar CT image reconstructions and MIPs were obtained to evaluate the vascular anatomy. Carotid stenosis measurements (when applicable) are obtained utilizing NASCET criteria, using the distal internal carotid diameter as the denominator. CONTRAST:  75mL ISOVUE-370 IOPAMIDOL (ISOVUE-370) INJECTION 76% COMPARISON:  CT same day. FINDINGS: CTA NECK FINDINGS Aortic arch: Aortic atherosclerosis.  No aneurysm or dissection. Right carotid system: Common carotid artery shows some  atherosclerotic plaque but is widely patent to the bifurcation. There is soft and calcified plaque affecting the carotid bifurcation and ICA bulb region. There is severe stenosis in the distal bulb region with luminal diameter of 1 mm. The vessel is quite tortuous beyond that but widely patent to the upper cervical region, where there is soft and calcified plaque resulting in minimal diameter of 3.5 mm. Compared to an expected diameter of 5 mm, stenosis in the bulb is 80% or greater. Stenosis in the upper cervical region is 30%. Left carotid system: Common carotid artery shows atherosclerotic plaque but is sufficiently patent to the bifurcation region. There is calcified and soft plaque at the carotid bifurcation and ICA bulb. There is left internal carotid artery occlusion at the distal bulb. 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 The left vertebral artery shows 50% stenosis at its origin but is sufficiently patent beyond that through the cervical region to the foramen  magnum. Skeleton: Mid cervical spondylosis. Other neck: No mass or lymphadenopathy. Upper chest: Interstitial prominence which could go along with fluid overload or mild edema. Review of the MIP images confirms the above findings CTA HEAD FINDINGS Anterior circulation: Left internal carotid artery is occluded without antegrade flow through the skull base. Right internal carotid artery shows atherosclerotic disease in the carotid siphon region with stenosis estimated at 50-70%. The anterior and middle cerebral vessels are patent. There is atherosclerotic irregularity of the M1 segment with stenosis of 50-70%. No acute vessel occlusion is identified. There is a chronic punctate calcified embolus in 1 of the insular branches, but this was present in 2014. On the left, there is reconstituted flow in the distal siphon. Severe stenosis of the supraclinoid ICA, 80% or greater. Flow is present in the anterior and middle  cerebral vessels, secondary to reconstituted flow and flow through communicating arteries. There is 50% stenosis in the M1 segment. I do not see any occluded or missing large or medium vessels in the MCA territory. Posterior circulation: Right vertebral artery shows no antegrade flow at the foramen magnum. There is retrograde flow in the distal right vertebral. Left vertebral artery is patent at the foramen magnum. There is atherosclerotic disease of the V4 segment with stenosis estimated at 70%. There are serial stenoses. The vessel does show flow to the basilar, which shows atherosclerotic irregularity but is patent. Flow is present in the superior cerebellar and posterior cerebral arteries. Venous sinuses: Patent and normal. Anatomic variants: None significant. Delayed phase: No abnormal enhancement. 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 Severe irregular atherosclerotic disease of the right carotid bifurcation with stenosis of 80% in the bulb region and 30% in the distal ICA. 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 50% stenosis of the left vertebral artery origin. Severe atherosclerotic disease in both carotid siphon regions. Right siphon stenosis estimated at 50-70%. Reconstituted flow in the distal left siphon and supraclinoid region. 80% supraclinoid stenosis. Flow in both anterior and middle cerebral artery territories. Stenosis of both M1 segments estimated at 50-70%. I do not identify any large or medium vessel occlusion acutely within the MCA branch vessels. Severely disease left V4 segment with serial stenoses proximal to the basilar estimated at 70%. Basilar atherosclerotic irregularity without flow limiting stenosis. Posterior circulation branch vessels do show flow, including right PICA which is supplied by retrograde flow in the distal right vertebral artery. Electronically Signed   By: [place]ina Fusi M.D.    On: 08/27/2018 13:14   Ct Head Code Stroke Wo Contrast`  Result Date: 08/27/2018 CLINICAL DATA:  Code stroke. Slurred speech beginning 3 weeks ago. Right-sided weakness beginning today. EXAM: CT HEAD WITHOUT CONTRAST TECHNIQUE: Contiguous axial images were obtained from the base of the skull through the vertex without intravenous contrast. COMPARISON:  CT 03/07/2013 FINDINGS: Brain: The brainstem and cerebellum appear normal by CT. Cerebral hemispheres show age related volume loss. There are old cortical and subcortical infarctions in the right frontal lobe, left parietal vertex, left frontal lobe, and left frontoparietal vertex. The show atrophy and encephalomalacia with gliosis. There is no finding of acute or subacute infarction on this CT. There is old lacunar infarction in the left caudate. Vascular: There is atherosclerotic calcification of the major vessels at the base of the brain. No evidence of acute hyperdense vessel. Skull: Negative Sinuses/Orbits: Clear/normal Other: None ASPECTS (Alberta Stroke Program Early CT Score) Aspects is  difficult in this patient with old infarctions. I do not suspect an acute insult. IMPRESSION: 1. No acute finding by CT. 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 2. 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 No acute finding is suspected by CT. 3. These results were called by telephone at the time of interpretation on 08/27/2018 at 11:47 am to Dr. Daryel NovemberJONATHAN WILLIAMS , who verbally acknowledged these results. Electronically Signed   By: Paulina FusiMark  Shogry M.D.   On: 08/27/2018 11:49    EKG:   Orders placed or performed during the hospital encounter of 08/27/18  . ED EKG  . ED EKG  Atrial fibrillation with 91 bpm with right bundle branch block, left posterior fascicular block.  IMPRESSION AND PLAN:   [age] year old male with history of essential  hypertension, chronic A. fib, hyperlipidemia, intolerant to statins, diabetes mellitus type 2 comes in because of acute right-sided weakness, initial NIH stroke scale of 4.  #1 .acute right-sided weakness likely due to acute stroke: Initial CT head showed bilateral frontoparietal strokes which are old, telemetry neurology is consulted, code stroke activated, TPA is not given because patient already on Pradaxa.  CT Angie of head and neck obtained which showed right carotid stenosis, intracranial V4 stenosis, spoke with Dr. Loretha BrasilZeylikman neurology recommended admission to stroke unit, continue aspirin, Pradaxa, check fasting lipids tomorrow, check neurochecks, routine stroke work-up including echocardiogram, MRI of the brain.  Same explained to the patient and patient's family.  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 History of statin intolerance but unable to see why he is statin intolerant in his medical records, patient is unable to tell me why he cannot take statins. #2. A. fib, rate controlled, continue Pradaxa, bisoprolol. 3.  Diabetes mellitus type 2: Continue Forman, add sliding scale insulin with coverage. 3.  hypothyroidism: Continue Synthyroid 4..  Chronic venous stasis dermatitis disease: Continue SCDs, HCTZ, losartan. 5.  History of skin ulcer of the toe on the left side, followed by Dr. Graciela HusbandsKlein from podiatry. #6 chronic lymphedema, followed by Dr. Gilda CreaseSchnier from vascular, continue graduated compression stockings on daily basis.  7. history of mild nonischemic dilated cardiomyopathy  #8.history of obstructive sleep apnea, continue CPAP at night.  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 Achilles Dunkope before. All the records are reviewed and case discussed with ED provider. Management plans discussed with the patient, family and they are in agreement.  CODE STATUS: Full code  TOTAL TIME TAKING CARE OF THIS PATIENT:4355minutes.    Katha HammingSnehalatha  Babs Dabbs M.D on 08/27/2018 at 2:29 PM  Between 7am to 6pm - Pager - [phone]  After 6pm go to [email or web address] - password [email or web address] [facility]  [facility]  Office  [phone]  CC: Primary care physician; Lynnea FerrierKlein, Bert J III, MD  Note: This dictation was prepared with Dragon dictation along with smaller phrase technology. Any transcriptional errors that result from this process are unintentional.

## 2018-08-27 NOTE — Consult Note (Addendum)
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 RLE weakness Comments:  77 yo with RLE weakness > numbness. NIHSS 4. No tPA as he takes Pradaxa currently. Isolated LE symptoms may be due to ACA territory event so CTA head/neck is being ordered to ensure no LVO. If no LVO (as expected) then admission for routine stroke work-up. If + LVO would discuss with NIR team at [facility]. Metrics:  Last Known Well: 08/27/2018 08:30:00  [facility] Notification Time: 08/27/2018 11:34:16  Arrival Time: 08/27/2018 11:11:00  Stamp Time: 08/27/2018 11:34:56  Time First Login Attempt: 08/27/2018 11:37:48  Video Start Time: 08/27/2018 11:37:48 Symptoms: RLE Weakness NIHSS Start Assessment Time: 08/27/2018 11:53:30 Patient is not a candidate for tPA. Patient was not deemed candidate for tPA thrombolytics because of Current use of NOA's. Video End Time: 08/27/2018 11:59:06 CT head showed no acute hemorrhage or acute core infarct. CT head was reviewed.  Advanced imaging CTA head and neck obtained.   Radiologist was not called back for review of advanced imaging because pending CTA ER Physician notified of the decision on thrombolytics management on 08/27/2018 12:02:59 Our recommendations are outlined below. Recommendations:  . Activate Stroke Protocol Admission/Order Set  . Stroke/Telemetry Floor  . Neuro Checks  . Bedside Swallow Eval  . DVT Prophylaxis  . IV Fluids, Normal Saline  . Head of Bed Below 30 Degrees  . Euglycemia and Avoid Hyperthermia (PRN Acetaminophen)  . Continue Pradaxa   Routine Consultation with Inhouse Neurology for Follow up Care Sign  Out:  . Discussed with Emergency Department Provider     ------------------------------------------------------------------------------   History of Present Illness:  Patient is a 77 year old Male.   Patient was brought by EMS for symptoms of RLE Weakness  77 yo out of bed this morning normal. While sitting on bed putting on compression stockings noted RLE weakness/numbness, and actually slid off of bed. No face/arm symptoms. No back pain. Symptoms persist. A Fib history, taking Pradaxa with last dose last evening.  CT head showed no acute hemorrhage or acute core infarct. CT head was reviewed.  Examination:  1A: Level of Consciousness - Alert; keenly responsive + 0 1B: Ask Month and Age - Both Questions Right + 0 1C: Blink Eyes & Squeeze Hands - Performs Both Tasks + 0 2: Test Horizontal Extraocular Movements - Normal + 0 3: Test Visual Fields - No Visual Loss + 0 4: Test Facial Palsy (Use Grimace if Obtunded) - Normal symmetry + 0 5A: Test Left Arm Motor Drift - No Drift for 10 Seconds + 0 5B: Test Right Arm Motor Drift - No Drift for 10 Seconds + 0 6A: Test Left Leg Motor Drift - No Drift for 5 Seconds + 0 6B: Test Right Leg Motor Drift - No Effort Against Gravity + 3 7: Test Limb Ataxia (FNF/Heel-Shin) - No Ataxia + 0 8: Test Sensation - Mild-Moderate Loss: Less Sharp/More Dull + 1 9: Test Language/Aphasia - Normal; No aphasia + 0 10: Test Dysarthria - Normal + 0 11: Test Extinction/Inattention - No abnormality + 0 NIHSS Score: 4 Patient was informed the Neurology Consult would happen via TeleHealth consult by way  of interactive audio and video telecommunications and consented to receiving care in this manner. Due to the immediate potential for life-threatening deterioration due to underlying acute neurologic illness, I spent 35 minutes providing critical care. This time includes time for face to face visit via telemedicine, review of medical records, imaging studies and  discussion of findings with providers, the patient and/or family.  Dr Michae KavaAdam Crandall Harvel  [facility] [phone]   Case [id number]

## 2018-08-27 NOTE — ED Notes (Signed)
Pt finished speaking with neurologist via tele. Pt in NAD at this time. Pt still has the same weakness in RLE. Candelaria Stagers is at pt's bedside along with his spouse. Pt takes Predaxa so neurologist is against doing TPA at this time.

## 2018-08-27 NOTE — Progress Notes (Addendum)
Patient on 4NICU at 2010. Per IR RN on telephone report and at bedside, site was "oozing". Groin site was bleeding heavily around unsutured sheath. Pressure applied to bleeding site. Neuro IR MD Deveshwar paged. I was told from Deveshwar to lower blood pressure closer to 140 systolic, keep patient flat, apply pressure, and get an aPTT. Deveshwar paged again around 2130 d/t patient continuing to bleed heavily. Again told to keep pressure at the site, try to get patient's blood pressure closer to 120 systolic, keep flat.  ACT and INR drawn. I asked Deveshwar if he would come up and assess the patient. MD refused.  After holding pressure for approximately 3 hours and site still gushing blood, charge RN called 2H RN about sheath removal per critical care MD Doctor Catha Gosselin. ACT less than 175 per 2H protocol and SBP less than 130 so 2 trained 2H RNs came and pulled sheath. Sheath was pulled at 2323 and pressure held for 30 minutes. Hemastasis achieved at 2353. VPAD applied. Following sheath removal and pressure, groin site was a level 1. Site began to bleed again around 0015 post chest x-ray. Pressure was held until bleeding stopped. DP pulse distal to groin site dopplerable throughout this time, and capillary refill was 3 seconds or less. Keeping patient's blood pressure within given parameters presented some challenge. Told to keep blood pressure on the lower end of parameters to help stop bleeding. If BP got below parameters, sedation was stopped or rate lowered, and then patient would be restless and reach for ETT. In order to help with BP and allow for sedation, E-Link MD paged, and order for phenylephrine given. Will continue to monitor patient and check groin site.

## 2018-08-28 ENCOUNTER — Inpatient Hospital Stay (HOSPITAL_COMMUNITY): Payer: Medicare HMO

## 2018-08-28 ENCOUNTER — Encounter (HOSPITAL_COMMUNITY): Payer: Self-pay | Admitting: Radiology

## 2018-08-28 DIAGNOSIS — J9601 Acute respiratory failure with hypoxia: Secondary | ICD-10-CM

## 2018-08-28 DIAGNOSIS — I63232 Cerebral infarction due to unspecified occlusion or stenosis of left carotid arteries: Secondary | ICD-10-CM

## 2018-08-28 LAB — CBC
HCT: 29.6 % — ABNORMAL LOW (ref 39.0–52.0)
Hemoglobin: 9.4 g/dL — ABNORMAL LOW (ref 13.0–17.0)
MCH: 31.5 pg (ref 26.0–34.0)
MCHC: 31.8 g/dL (ref 30.0–36.0)
MCV: 99.3 fL (ref 80.0–100.0)
PLATELETS: 240 10*3/uL (ref 150–400)
RBC: 2.98 MIL/uL — ABNORMAL LOW (ref 4.22–5.81)
RDW: 13.9 % (ref 11.5–15.5)
WBC: 16.6 10*3/uL — ABNORMAL HIGH (ref 4.0–10.5)
nRBC: 0 % (ref 0.0–0.2)

## 2018-08-28 LAB — CBC WITH DIFFERENTIAL/PLATELET
Abs Immature Granulocytes: 0.11 10*3/uL — ABNORMAL HIGH (ref 0.00–0.07)
Basophils Absolute: 0.1 10*3/uL (ref 0.0–0.1)
Basophils Relative: 0 %
Eosinophils Absolute: 0.2 10*3/uL (ref 0.0–0.5)
Eosinophils Relative: 1 %
HCT: 28.6 % — ABNORMAL LOW (ref 39.0–52.0)
Hemoglobin: 9.2 g/dL — ABNORMAL LOW (ref 13.0–17.0)
IMMATURE GRANULOCYTES: 1 %
Lymphocytes Relative: 12 %
Lymphs Abs: 2 10*3/uL (ref 0.7–4.0)
MCH: 31.8 pg (ref 26.0–34.0)
MCHC: 32.2 g/dL (ref 30.0–36.0)
MCV: 99 fL (ref 80.0–100.0)
Monocytes Absolute: 1.4 10*3/uL — ABNORMAL HIGH (ref 0.1–1.0)
Monocytes Relative: 9 %
NEUTROS PCT: 77 %
NRBC: 0 % (ref 0.0–0.2)
Neutro Abs: 12.4 10*3/uL — ABNORMAL HIGH (ref 1.7–7.7)
Platelets: 229 10*3/uL (ref 150–400)
RBC: 2.89 MIL/uL — ABNORMAL LOW (ref 4.22–5.81)
RDW: 13.7 % (ref 11.5–15.5)
WBC: 16.1 10*3/uL — ABNORMAL HIGH (ref 4.0–10.5)

## 2018-08-28 LAB — LIPID PANEL
CHOLESTEROL: 138 mg/dL (ref 0–200)
HDL: 24 mg/dL — ABNORMAL LOW (ref >40)
LDL Cholesterol: 81 mg/dL (ref 0–99)
Total CHOL/HDL Ratio: 5.8 RATIO
Triglycerides: 164 mg/dL — ABNORMAL HIGH (ref <150)
VLDL: 33 mg/dL (ref 0–40)

## 2018-08-28 LAB — BASIC METABOLIC PANEL
Anion gap: 9 (ref 5–15)
BUN: 12 mg/dL (ref 8–23)
CALCIUM: 7.6 mg/dL — AB (ref 8.9–10.3)
CO2: 25 mmol/L (ref 22–32)
Chloride: 104 mmol/L (ref 98–111)
Creatinine, Ser: 0.98 mg/dL (ref 0.61–1.24)
GFR calc Af Amer: >60 mL/min (ref >60)
GFR calc non Af Amer: >60 mL/min (ref >60)
Glucose, Bld: 140 mg/dL — ABNORMAL HIGH (ref 70–99)
Potassium: 4 mmol/L (ref 3.5–5.1)
Sodium: 138 mmol/L (ref 135–145)

## 2018-08-28 LAB — PHOSPHORUS
PHOSPHORUS: 3.3 mg/dL (ref 2.5–4.6)
Phosphorus: 3.4 mg/dL (ref 2.5–4.6)

## 2018-08-28 LAB — MAGNESIUM
Magnesium: 1.5 mg/dL — ABNORMAL LOW (ref 1.7–2.4)
Magnesium: 1.5 mg/dL — ABNORMAL LOW (ref 1.7–2.4)

## 2018-08-28 LAB — ECHOCARDIOGRAM COMPLETE
HEIGHTINCHES: 69 in
WEIGHTICAEL: 5178.16 [oz_av]

## 2018-08-28 LAB — GLUCOSE, CAPILLARY
GLUCOSE-CAPILLARY: 154 mg/dL — AB (ref 70–99)
Glucose-Capillary: 160 mg/dL — ABNORMAL HIGH (ref 70–99)

## 2018-08-28 LAB — TRIGLYCERIDES: Triglycerides: 171 mg/dL — ABNORMAL HIGH (ref <150)

## 2018-08-28 MED ORDER — VITAMIN D3 25 MCG (1000 UT) PO CAPS: 1000.0000 [IU] | ORAL_CAPSULE | Freq: Every day | ORAL | Status: DC

## 2018-08-28 MED ORDER — LEVOTHYROXINE SODIUM 88 MCG PO TABS
88.0000 ug | ORAL_TABLET | Freq: Every day | ORAL | Status: DC
  Administered 2018-08-28 – 2018-08-30 (×3): 88 ug
  Filled 2018-08-28 (×3): qty 1

## 2018-08-28 MED ORDER — PRO-STAT SUGAR FREE PO LIQD
60.0000 mL | Freq: Four times a day (QID) | ORAL | Status: DC
  Administered 2018-08-28 – 2018-08-29 (×6): 60 mL
  Filled 2018-08-28 (×6): qty 60

## 2018-08-28 MED ORDER — CHOLECALCIFEROL 10 MCG (400 UNIT) PO TABS
1000.0000 [IU] | ORAL_TABLET | Freq: Every day | ORAL | Status: DC
  Administered 2018-08-29 – 2018-09-08 (×5): 1000 [IU]
  Filled 2018-08-28 (×13): qty 3

## 2018-08-28 MED ORDER — PRO-STAT SUGAR FREE PO LIQD
30.0000 mL | Freq: Two times a day (BID) | ORAL | Status: DC
  Administered 2018-08-28: 30 mL
  Filled 2018-08-28: qty 30

## 2018-08-28 MED ORDER — LEVOTHYROXINE SODIUM 88 MCG PO TABS: 88.0000 ug | ORAL_TABLET | Freq: Every day | ORAL | Status: DC

## 2018-08-28 MED ORDER — VITAL HIGH PROTEIN PO LIQD: 1000.0000 mL | ORAL | Status: DC

## 2018-08-28 MED ORDER — FAMOTIDINE 20 MG PO TABS
20.0000 mg | ORAL_TABLET | Freq: Two times a day (BID) | ORAL | Status: DC
  Administered 2018-08-28 – 2018-08-29 (×4): 20 mg
  Filled 2018-08-28 (×4): qty 1

## 2018-08-28 MED ORDER — MUPIROCIN 2 % EX OINT
1.0000 "application " | TOPICAL_OINTMENT | Freq: Two times a day (BID) | CUTANEOUS | Status: AC
  Administered 2018-08-28 – 2018-09-01 (×10): 1 via NASAL
  Filled 2018-08-28: qty 22

## 2018-08-28 MED ORDER — FAMOTIDINE 20 MG PO TABS: 20.0000 mg | ORAL_TABLET | Freq: Two times a day (BID) | ORAL | Status: DC

## 2018-08-28 MED ORDER — PHENYLEPHRINE HCL-NACL 10-0.9 MG/250ML-% IV SOLN
0.0000 ug/min | INTRAVENOUS | Status: DC
  Administered 2018-08-28: 160 ug/min via INTRAVENOUS
  Administered 2018-08-28: 150 ug/min via INTRAVENOUS
  Administered 2018-08-28: 140 ug/min via INTRAVENOUS
  Administered 2018-08-28 (×3): 170 ug/min via INTRAVENOUS
  Administered 2018-08-28: 150 ug/min via INTRAVENOUS
  Administered 2018-08-28: 20 ug/min via INTRAVENOUS
  Administered 2018-08-28: 150 ug/min via INTRAVENOUS
  Administered 2018-08-28 (×2): 160 ug/min via INTRAVENOUS
  Administered 2018-08-29 (×5): 400 ug/min via INTRAVENOUS
  Administered 2018-08-29: 320 ug/min via INTRAVENOUS
  Administered 2018-08-29: 20 ug/min via INTRAVENOUS
  Administered 2018-08-29: 400 ug/min via INTRAVENOUS
  Administered 2018-08-29: 370 ug/min via INTRAVENOUS
  Administered 2018-08-29 (×2): 400 ug/min via INTRAVENOUS
  Filled 2018-08-28: qty 500
  Filled 2018-08-28 (×13): qty 250
  Filled 2018-08-28: qty 500
  Filled 2018-08-28: qty 750
  Filled 2018-08-28 (×5): qty 250

## 2018-08-28 MED ORDER — VITAL HIGH PROTEIN PO LIQD
1000.0000 mL | ORAL | Status: DC
  Administered 2018-08-29: 1000 mL

## 2018-08-28 MED ORDER — ADULT MULTIVITAMIN W/MINERALS CH: 1.0000 | ORAL_TABLET | Freq: Every day | ORAL | Status: DC

## 2018-08-28 MED ORDER — ORAL CARE MOUTH RINSE
15.0000 mL | OROMUCOSAL | Status: DC
  Administered 2018-08-28 – 2018-09-02 (×47): 15 mL via OROMUCOSAL

## 2018-08-28 MED ORDER — VITAL HIGH PROTEIN PO LIQD
1000.0000 mL | ORAL | Status: DC
  Administered 2018-08-28: 1000 mL

## 2018-08-28 MED ORDER — NOREPINEPHRINE BITARTRATE 1 MG/ML IV SOLN
0.0000 ug/min | INTRAVENOUS | Status: DC
  Administered 2018-08-28: 10 ug/min via INTRAVENOUS
  Administered 2018-08-28: 22 ug/min via INTRAVENOUS
  Filled 2018-08-28 (×3): qty 4

## 2018-08-28 MED ORDER — CHLORHEXIDINE GLUCONATE CLOTH 2 % EX PADS
6.0000 | MEDICATED_PAD | Freq: Every day | CUTANEOUS | Status: AC
  Administered 2018-08-29 – 2018-09-01 (×4): 6 via TOPICAL

## 2018-08-28 MED ORDER — ADULT MULTIVITAMIN LIQUID CH
15.0000 mL | Freq: Every day | ORAL | Status: DC
  Administered 2018-08-28 – 2018-08-29 (×2): 15 mL
  Filled 2018-08-28 (×3): qty 15

## 2018-08-28 MED ORDER — CHLORHEXIDINE GLUCONATE 0.12% ORAL RINSE (MEDLINE KIT)
15.0000 mL | Freq: Two times a day (BID) | OROMUCOSAL | Status: DC
  Administered 2018-08-28 – 2018-09-02 (×12): 15 mL via OROMUCOSAL

## 2018-08-28 MED ORDER — INSULIN ASPART 100 UNIT/ML ~~LOC~~ SOLN
0.0000 [IU] | SUBCUTANEOUS | Status: DC
  Administered 2018-08-28 (×2): 4 [IU] via SUBCUTANEOUS
  Administered 2018-08-28: 3 [IU] via SUBCUTANEOUS
  Administered 2018-08-28 – 2018-08-29 (×4): 4 [IU] via SUBCUTANEOUS
  Administered 2018-08-29 (×2): 7 [IU] via SUBCUTANEOUS
  Administered 2018-08-29: 11 [IU] via SUBCUTANEOUS
  Administered 2018-08-29 – 2018-08-30 (×4): 3 [IU] via SUBCUTANEOUS
  Administered 2018-08-30 (×3): 4 [IU] via SUBCUTANEOUS
  Administered 2018-08-31: 2 [IU] via SUBCUTANEOUS
  Administered 2018-08-31 (×3): 3 [IU] via SUBCUTANEOUS
  Administered 2018-08-31: 7 [IU] via SUBCUTANEOUS
  Administered 2018-09-01 (×3): 3 [IU] via SUBCUTANEOUS
  Administered 2018-09-01: 4 [IU] via SUBCUTANEOUS
  Administered 2018-09-02 – 2018-09-03 (×7): 3 [IU] via SUBCUTANEOUS
  Administered 2018-09-03 – 2018-09-04 (×2): 4 [IU] via SUBCUTANEOUS
  Administered 2018-09-04 (×3): 3 [IU] via SUBCUTANEOUS

## 2018-08-28 NOTE — Progress Notes (Signed)
OT Cancellation Note  Patient Details Name: Obada Sheils MRN: [id number] DOB: Jun 08, 1941   Cancelled Treatment:    Reason Eval/Treat Not Completed: Medical issues which prohibited therapy;Patient not medically ready. Pt intubated and sedated. Will initiate OT eval when medically ready and able.    Chancy Milroy, OT Acute Rehabilitation Services Pager [phone] Office [phone]    Chancy Milroy 08/28/2018, 8:21 AM

## 2018-08-28 NOTE — Progress Notes (Signed)
SLP Cancellation Note  Patient Details Name: Darren Frazier MRN: [id number] DOB: 09/04/1940   Cancelled treatment:       Reason Eval/Treat Not Completed: Patient not medically ready   Rico Massar, Riley Nearing 08/28/2018, 2:37 PM

## 2018-08-28 NOTE — Progress Notes (Signed)
Supervising Physician: Julieanne Cotton  Patient Status:  [facility] - In-pt  Chief Complaint: Follow-up left ICA occlusion with attempted intervention 08/27/18  Subjective:  Patient with past medical history significant for cardiomyopathy, a.fib on pradaxa, DM, GERD, HLD, HTN, hypothyroidism and CVA x2 (2009 and 2011) who presented to [facility] ED on 08/27/18 with complaints of right lower extremity weakness, dyspnea and chest pressure. NIH stroke scale 4, CT head showed bilateral frontoparietal strokes which were old with no acute findings. 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 Cerebral perfusion scan showed delayed perfusion in the left MCA territory. 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 He currently remains intubated and sedated in ICU.  Per RN at bedside patient with continued bleeding from right groin puncture site, sheath was removed and bleeding as since stopped after pressure was held. He had been started on pressors around 4 am due to hypotension (BPs 80/50s). He is breathing over the vent and when sedation is decreased he moves his left arm to grab for tubing and left leg spontaneously, he does not follow commands. His right side has remained flaccid. She reports that plan is for patient to remain intubated today and attempt extubation tomorrow.   Patient's wife and son at beside today  during exam - state that patient had been talking and was completely oriented upon arrival to the hospital, just weak on his right side. Wife states that patient had experienced an episode of mouth drooping/slurred speech about a month ago but had refused to be taken to the hospital. They state he has otherwise been in fairly good health and to their knowledge was taking his prescribed medications.   Allergies: Ace inhibitors; Omeprazole; and Statins  Medications: Prior to Admission medications   Medication Sig Start Date End Date Taking? Authorizing Provider  amLODipine (NORVASC) 10 MG tablet Take 10 mg by mouth daily.  02/06/17   [provider]  aspirin 81 MG chewable tablet Chew 81 mg by mouth daily.     [provider]  bisoprolol (ZEBETA) 10 MG tablet Take 10 mg by mouth daily.  05/29/16   [provider]  Calcium Citrate (CITRACAL PO) Take 1 tablet by mouth 2 (two) times daily.    [provider]  Cholecalciferol (VITAMIN D3) 1000 units CAPS Take 1,000 Units by mouth daily.     [provider]  dabigatran (PRADAXA) 150 MG CAPS capsule Take 150 mg by mouth 2 (two) times daily.  11/21/16   [provider]  doxazosin (CARDURA) 2 MG tablet Take 2 mg by mouth daily.  02/06/17   [provider]  famotidine (PEPCID) 20 MG tablet Take 20 mg by mouth 2 (two) times daily.    [provider]  fluticasone (FLONASE) 50 MCG/ACT nasal spray Place 2 sprays into both nostrils daily as needed for allergies or rhinitis.     [provider]  gabapentin (NEURONTIN) 100 MG capsule Take 200 mg by mouth 2 (two) times  daily.  03/05/17   [provider]  hydrochlorothiazide (HYDRODIURIL) 25 MG tablet Take 25 mg by mouth daily.  07/30/12   [provider]  levothyroxine (SYNTHROID, LEVOTHROID) 88 MCG tablet Take 88 mcg by mouth daily before breakfast.  03/22/17   [provider]  losartan (COZAAR) 100 MG tablet Take  100 mg by mouth daily.  07/30/12   [provider]  meloxicam (MOBIC) 15 MG tablet Take 15 mg by mouth daily.     [provider]  metFORMIN (GLUCOPHAGE) 1000 MG tablet Take 1,000 mg by mouth 2 (two) times daily with a meal.  02/06/17   [provider]  Multiple Vitamin (MULTIVITAMIN) capsule Take 1 capsule by mouth daily.     [provider]  Omega-3 Fatty Acids (FISH OIL) 1200 MG CAPS Take 1,200 mg by mouth 3 (three) times daily.     [provider]  vitamin C (ASCORBIC ACID) 500 MG tablet Take 500 mg by mouth daily.    [provider]     Vital Signs: BP (!) 144/61 (BP Location: Right Arm)   Pulse 96   Temp 99.2 F (37.3 C) (Oral)   Resp 16   Ht 5\' 9"  (1.753 m)   Wt (!) 323 lb 10.2 oz (146.8 kg) Comment: Per bed scale  SpO2 94%   BMI 47.79 kg/m   Physical Exam Vitals signs and nursing note reviewed.  Constitutional:      Appearance: He is obese. He is ill-appearing.     Comments: Wife and son at bedside. Sedated, intubated.  HENT:     Head: Normocephalic and atraumatic.  Cardiovascular:     Rate and Rhythm: Rhythm irregular.     Comments: (+) a.fib Pulmonary:     Comments: Intubated, ventilated. Skin:    General: Skin is warm and dry.     Comments: Right CFA puncture site clean, dry, dressed appropriately; soft, no hematoma or active bleeding noted.   Sedated, ventilated, does not open eyes or follow commands currently Speech and comprehension - unable to assess PERRL EOMs not assessed Visual fields not assessed No facial asymmetry noted. Tongue midline - not assessed Motor power not assessed Pronator drift not assessed Fine motor and coordination not assessed Gait not assessed Romberg not assessed Heel to toe not assessed Distal pulses able to be found with doppler bilaterally.   Imaging: Ct Angio Head W Or Wo Contrast  Result Date: 08/27/2018 CLINICAL DATA:  Acute presentation with speech disturbance.  EXAM: CT ANGIOGRAPHY HEAD AND NECK TECHNIQUE: Multidetector CT imaging of the head and neck was performed using the standard protocol during bolus administration of intravenous contrast. Multiplanar CT image reconstructions and MIPs were obtained to evaluate the vascular anatomy. Carotid stenosis measurements (when applicable) are obtained utilizing NASCET criteria, using the distal internal carotid diameter as the denominator. CONTRAST:  75mL ISOVUE-370 IOPAMIDOL (ISOVUE-370) INJECTION 76% COMPARISON:  CT same day. FINDINGS: CTA NECK FINDINGS Aortic arch: Aortic atherosclerosis.  No aneurysm or dissection. Right carotid system: Common carotid artery shows some atherosclerotic plaque but is widely patent to the bifurcation. There is soft and calcified plaque affecting the carotid bifurcation and ICA bulb region. There is severe stenosis in the distal bulb region with luminal diameter of 1 mm. The vessel is quite tortuous beyond that but widely patent to the upper cervical region, where there is soft and calcified plaque resulting in minimal diameter of 3.5 mm. Compared to an expected diameter of 5 mm, stenosis in the  bulb is 80% or greater. Stenosis in the upper cervical region is 30%. Left carotid system: Common carotid artery shows atherosclerotic plaque but is sufficiently patent to the bifurcation region. There is calcified and soft plaque at the carotid bifurcation and ICA bulb. There is left internal carotid artery occlusion at the distal bulb. 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 The left vertebral artery shows 50% stenosis at its origin but is sufficiently patent beyond that through the cervical region to the foramen magnum. Skeleton: Mid cervical spondylosis. Other neck: No mass or lymphadenopathy. Upper chest: Interstitial prominence which could go along with fluid overload or mild edema. Review of the MIP images confirms the above findings CTA HEAD FINDINGS Anterior  circulation: Left internal carotid artery is occluded without antegrade flow through the skull base. Right internal carotid artery shows atherosclerotic disease in the carotid siphon region with stenosis estimated at 50-70%. The anterior and middle cerebral vessels are patent. There is atherosclerotic irregularity of the M1 segment with stenosis of 50-70%. No acute vessel occlusion is identified. There is a chronic punctate calcified embolus in 1 of the insular branches, but this was present in 2014. On the left, there is reconstituted flow in the distal siphon. Severe stenosis of the supraclinoid ICA, 80% or greater. Flow is present in the anterior and middle cerebral vessels, secondary to reconstituted flow and flow through communicating arteries. There is 50% stenosis in the M1 segment. I do not see any occluded or missing large or medium vessels in the MCA territory. Posterior circulation: Right vertebral artery shows no antegrade flow at the foramen magnum. There is retrograde flow in the distal right vertebral. Left vertebral artery is patent at the foramen magnum. There is atherosclerotic disease of the V4 segment with stenosis estimated at 70%. There are serial stenoses. The vessel does show flow to the basilar, which shows atherosclerotic irregularity but is patent. Flow is present in the superior cerebellar and posterior cerebral arteries. Venous sinuses: Patent and normal. Anatomic variants: None significant. Delayed phase: No abnormal enhancement. 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 Severe irregular atherosclerotic disease of the right carotid bifurcation with stenosis of 80% in the bulb region and 30% in the distal ICA. 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 50% stenosis of the left vertebral artery origin. Severe atherosclerotic disease in both carotid siphon regions. Right siphon stenosis estimated at  50-70%. Reconstituted flow in the distal left siphon and supraclinoid region. 80% supraclinoid stenosis. Flow in both anterior and middle cerebral artery territories. Stenosis of both M1 segments estimated at 50-70%. I do not identify any large or medium vessel occlusion acutely within the MCA branch vessels. Severely disease left V4 segment with serial stenoses proximal to the basilar estimated at 70%. Basilar atherosclerotic irregularity without flow limiting stenosis. Posterior circulation branch vessels do show flow, including right PICA which is supplied by retrograde flow in the distal right vertebral artery. Electronically Signed   By: Paulina Fusi M.D.   On: 08/27/2018 13:14   Dg Chest 2 View  Result Date: 08/27/2018 CLINICAL DATA:  Chest pain and shortness of breath.  Cardiomyopathy. EXAM: CHEST - 2 VIEW COMPARISON:  03/07/2013 FINDINGS: Mild cardiomegaly. Aortic atherosclerosis. Diffuse interstitial infiltrates, consistent with pulmonary edema. Mild subsegmental atelectasis also seen in the left upper lobe. No evidence of pulmonary consolidation or significant pleural effusion. IMPRESSION: Mild cardiomegaly and diffuse interstitial edema, consistent with  congestive heart failure. Electronically Signed   By: Myles RosenthalJohn  Stahl M.D.   On: 08/27/2018 12:02   Ct Angio Neck W Or Wo Contrast  Result Date: 08/27/2018 CLINICAL DATA:  Acute presentation with speech disturbance. EXAM: CT ANGIOGRAPHY HEAD AND NECK TECHNIQUE: Multidetector CT imaging of the head and neck was performed using the standard protocol during bolus administration of intravenous contrast. Multiplanar CT image reconstructions and MIPs were obtained to evaluate the vascular anatomy. Carotid stenosis measurements (when applicable) are obtained utilizing NASCET criteria, using the distal internal carotid diameter as the denominator. CONTRAST:  75mL ISOVUE-370 IOPAMIDOL (ISOVUE-370) INJECTION 76% COMPARISON:  CT same day. FINDINGS: CTA NECK  FINDINGS Aortic arch: Aortic atherosclerosis.  No aneurysm or dissection. Right carotid system: Common carotid artery shows some atherosclerotic plaque but is widely patent to the bifurcation. There is soft and calcified plaque affecting the carotid bifurcation and ICA bulb region. There is severe stenosis in the distal bulb region with luminal diameter of 1 mm. The vessel is quite tortuous beyond that but widely patent to the upper cervical region, where there is soft and calcified plaque resulting in minimal diameter of 3.5 mm. Compared to an expected diameter of 5 mm, stenosis in the bulb is 80% or greater. Stenosis in the upper cervical region is 30%. Left carotid system: Common carotid artery shows atherosclerotic plaque but is sufficiently patent to the bifurcation region. There is calcified and soft plaque at the carotid bifurcation and ICA bulb. There is left internal carotid artery occlusion at the distal bulb. 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 The left vertebral artery shows 50% stenosis at its origin but is sufficiently patent beyond that through the cervical region to the foramen magnum. Skeleton: Mid cervical spondylosis. Other neck: No mass or lymphadenopathy. Upper chest: Interstitial prominence which could go along with fluid overload or mild edema. Review of the MIP images confirms the above findings CTA HEAD FINDINGS Anterior circulation: Left internal carotid artery is occluded without antegrade flow through the skull base. Right internal carotid artery shows atherosclerotic disease in the carotid siphon region with stenosis estimated at 50-70%. The anterior and middle cerebral vessels are patent. There is atherosclerotic irregularity of the M1 segment with stenosis of 50-70%. No acute vessel occlusion is identified. There is a chronic punctate calcified embolus in 1 of the insular branches, but this was present in 2014. On the left, there is reconstituted  flow in the distal siphon. Severe stenosis of the supraclinoid ICA, 80% or greater. Flow is present in the anterior and middle cerebral vessels, secondary to reconstituted flow and flow through communicating arteries. There is 50% stenosis in the M1 segment. I do not see any occluded or missing large or medium vessels in the MCA territory. Posterior circulation: Right vertebral artery shows no antegrade flow at the foramen magnum. There is retrograde flow in the distal right vertebral. Left vertebral artery is patent at the foramen magnum. There is atherosclerotic disease of the V4 segment with stenosis estimated at 70%. There are serial stenoses. The vessel does show flow to the basilar, which shows atherosclerotic irregularity but is patent. Flow is present in the superior cerebellar and posterior cerebral arteries. Venous sinuses: Patent and normal. Anatomic variants: None significant. Delayed phase: No abnormal enhancement. 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 Severe irregular atherosclerotic disease of the right carotid bifurcation with stenosis of 80% in the bulb  region and 30% in the distal ICA. 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 50% stenosis of the left vertebral artery origin. Severe atherosclerotic disease in both carotid siphon regions. Right siphon stenosis estimated at 50-70%. Reconstituted flow in the distal left siphon and supraclinoid region. 80% supraclinoid stenosis. Flow in both anterior and middle cerebral artery territories. Stenosis of both M1 segments estimated at 50-70%. I do not identify any large or medium vessel occlusion acutely within the MCA branch vessels. Severely disease left V4 segment with serial stenoses proximal to the basilar estimated at 70%. Basilar atherosclerotic irregularity without flow limiting stenosis. Posterior circulation branch vessels do show flow, including right  PICA which is supplied by retrograde flow in the distal right vertebral artery. Electronically Signed   By: Paulina Fusi M.D.   On: 08/27/2018 13:14   Ct Cerebral Perfusion W Contrast  Result Date: 08/27/2018 CLINICAL DATA:  Right-sided weakness onset this morning. EXAM: CT PERFUSION BRAIN TECHNIQUE: Multiphase CT imaging of the brain was performed following IV bolus contrast injection. Subsequent parametric perfusion maps were calculated using RAPID software. CONTRAST:  40mL ISOVUE-370 IOPAMIDOL (ISOVUE-370) INJECTION 76% COMPARISON:  CT a head today FINDINGS: CT Brain Perfusion Findings: CBF (<30%) Volume: 0mL Perfusion (Tmax>6.0s) volume: Mismatch Volume: ASPECTS on noncontrast CT Head today, not calculated due to extensive chronic ischemic change. No definite acute infarct on CT. Infarct Core: 0 mL Infarction Location:Delayed perfusion throughout the left MCA territory with apparent sparing of the basal ganglia. There is occlusion of the left internal carotid artery on CTA, age indeterminate. Left middle cerebral artery is diseased and may have delayed perfusion due to collateral circulation and stenosis. IMPRESSION: 155 mL of delayed perfusion in the left MCA territory. No core infarction. Delayed perfusion left MCA territory may be due to severe intracranial atherosclerotic disease as well as occlusion of the left internal carotid artery. Age of the internal carotid artery occlusion is indeterminate. It is possible this delayed perfusion is a chronic or possibly acute finding. Electronically Signed   By: Marlan Palau M.D.   On: 08/27/2018 16:12   Dg Chest Port 1 View  Result Date: 08/28/2018 CLINICAL DATA:  Shortness of Breath EXAM: PORTABLE CHEST 1 VIEW COMPARISON:  08/27/2018 FINDINGS: Cardiac shadow is enlarged in size. Aortic calcifications are noted. Endotracheal tube is noted at the level of the carina. This should be withdrawn 2-3 cm. Nasogastric catheter is noted within the  stomach. Lungs are well aerated bilaterally with mild interstitial edema similar to that seen on the prior exam. IMPRESSION: Interval intubation with the endotracheal tube at the level of the carina. This should be withdrawn 2-3 cm. Stable edema bilaterally. These results will be called to the ordering clinician or representative by the Radiologist Assistant, and communication documented in the PACS or zVision Dashboard. Electronically Signed   By: Alcide Clever M.D.   On: 08/28/2018 00:19   Ct Head Code Stroke Wo Contrast`  Result Date: 08/27/2018 CLINICAL DATA:  Code stroke. Slurred speech beginning 3 weeks ago. Right-sided weakness beginning today. EXAM: CT HEAD WITHOUT CONTRAST TECHNIQUE: Contiguous axial images were obtained from the base of the skull through the vertex without intravenous contrast. COMPARISON:  CT 03/07/2013 FINDINGS: Brain: The brainstem and cerebellum appear normal by CT. Cerebral hemispheres show age related volume loss. There are old cortical and subcortical infarctions in the right frontal lobe, left parietal vertex, left frontal lobe, and left frontoparietal vertex. The show atrophy and encephalomalacia with gliosis. There is no finding  of acute or subacute infarction on this CT. There is old lacunar infarction in the left caudate. Vascular: There is atherosclerotic calcification of the major vessels at the base of the brain. No evidence of acute hyperdense vessel. Skull: Negative Sinuses/Orbits: Clear/normal Other: None ASPECTS (Alberta Stroke Program Early CT Score) Aspects is difficult in this patient with old infarctions. I do not suspect an acute insult. IMPRESSION: 1. No acute finding by CT. 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 2. 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 No acute finding is suspected by CT. 3. These results were called by telephone at the  time of interpretation on 08/27/2018 at 11:47 am to Dr. Daryel November , who verbally acknowledged these results. Electronically Signed   By: Paulina Fusi M.D.   On: 08/27/2018 11:49    Labs:  CBC: Recent Labs    08/27/18 1122 08/28/18 0600  WBC 10.9* 16.1*  HGB 13.0 9.2*  HCT 39.6 28.6*  PLT 226 229    COAGS: Recent Labs    08/27/18 2100 08/27/18 2200  INR  --  1.50  APTT 83*  --     BMP: Recent Labs    03/03/18 0943 03/03/18 0955 08/27/18 1122 08/28/18 0600  NA  --   --  136 138  K 3.8  --  3.8 4.0  CL  --   --  99 104  CO2  --   --  25 25  GLUCOSE  --   --  190* 140*  BUN  --  16 21 12   CALCIUM  --   --  9.4 7.6*  CREATININE  --  0.85 1.02 0.98  GFRNONAA  --  >60 >60 >60  GFRAA  --  >60 >60 >60    LIVER FUNCTION TESTS: No results for input(s): BILITOT, AST, ALT, ALKPHOS, PROT, ALBUMIN in the last 8760 hours.  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 He currently remains intubated and sedated in ICU on exam today.  MRI brain pending. Awaiting neurologic status to improve prior to extubation per CCM.   NIR will continue to follow along for possible repeat angiogram with possible intervention pending MRI results/neuro status after sedation has been weaned. Agree and appreciate management by neurology/CCM.  Please call with questions or concerns.    Electronically Signed: Villa Herb, PA-C 08/28/2018, 10:04 AM   I spent a total of 25 Minutes at the the patient's bedside AND on the patient's hospital floor or unit, greater than 50% of which was counseling/coordinating care for follow up attempted revascularization left ICA.

## 2018-08-28 NOTE — Progress Notes (Signed)
PT Cancellation Note  Patient Details Name: Sumit Torosian MRN: [id number] DOB: [birth year]   Cancelled Treatment:    Reason Eval/Treat Not Completed: Active bedrest order   Noted pt remains on the vent, and difficulty controlling bleeding last night.  Will follow up for PT eval/appropriateness of mobility;   Van Clines, [place]  [facility] Pager [phone] Office [phone]    Levi Aland 08/28/2018, 8:15 AM

## 2018-08-28 NOTE — Progress Notes (Signed)
  Echocardiogram 2D Echocardiogram has been performed.  Darren Frazier 08/28/2018, 10:36 AM

## 2018-08-28 NOTE — Progress Notes (Signed)
NAME:  Darren Frazier, MRN:  [id number], DOB:  16-Mar-1941, LOS: 1 ADMISSION DATE:  08/27/2018, CONSULTATION DATE: 08/27/2018 REFERRING MD:  Dr. Corliss Skainseveshwar, CHIEF COMPLAINT:  Rt leg weakness   Brief History   77 yo male [facility] with Rt leg numbness and weakness.  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  [facility].  He was intubated for neuro-IR intervention.  Past Medical History  A fib on pradaxa, HTN, Urethral stricture, cardiomyopathy, PAD, HLD, CVA, lymph edema, Vitamin D deficiency, Neuropathy, Hypothyroidism, DM  Significant Hospital Events   12/25 Admit, arteriogram by IR  Consults:  Neurology Neuro IR  Procedures:  ETT 12/25 >>   Significant Diagnostic Tests:  CT angio head/neck 12/25 >> occlusion of Lt ICA  Micro Data:    Antimicrobials:     Interim history/subjective:  Remains on vent support, sedation, pressors.  Objective   Blood pressure (!) 108/49, pulse 86, temperature 98.6 F (37 C), temperature source Axillary, resp. rate 16, height 5\' 9"  (1.753 m), weight (!) 146.8 kg, SpO2 98 %.    Vent Mode: PRVC FiO2 (%):  [40 %] 40 % Set Rate:  [16 bmp] 16 bmp Vt Set:  [560 mL] 560 mL PEEP:  [5 cmH20] 5 cmH20 Plateau Pressure:  [23 cmH20-27 cmH20] 23 cmH20   Intake/Output Summary (Last 24 hours) at 08/28/2018 0743 Last data filed at 08/28/2018 0700 Gross per 24 hour  Intake 4626.58 ml  Output 1745 ml  Net 2881.58 ml   Filed Weights   08/27/18 2013  Weight: (!) 146.8 kg    Examination:  General - sedated Eyes - pupils reactive ENT - ETT in place Cardiac - irregular, no murmur Chest - scattered rhonchi Abdomen - soft, non tender, + bowel sounds GU - foley in place Extremities - 1+ non pitting edema Skin - no rashes Neuro - RASS -2, moves Lt side  CXR 12/26 (reviewed by me) >> low lung volumes, increased vascular markings, atherosclerosis   Resolved Hospital Problem list     Assessment & Plan:   Acute respiratory  failure with compromised airway in setting of CVA. 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 Plan - resume ASA per neurology  Hx of HTN, A fib, HLD, Cardiomyopathy. Plan - goal SBP 140 to 160 per neurology >> continue pressors  - hold pradaxa until okay with neurology and neuro IR - f/u Echo - hold outpt norvasc, zebeta, HCTZ, cozaar  Anemia of critical illness and chronic disease. Plan - f/u CBC - transfuse for Hb < 7  DM type II with peripheral neuropathy. Hypothyroidism. Plan - SSI - continue synthroid 88 mcg daily - hold outpt metformin, neurontin   Best practice:  Diet: tube feeds DVT prophylaxis: SCDs GI prophylaxis: Protonix Mobility: Bed rest Code Status: Full code Family Communication: no family at bedside   Labs    CMP Latest Ref Rng & Units 08/28/2018 08/27/2018 03/03/2018  Glucose 70 - 99 mg/dL 841(L140(H) 244(W190(H) -  BUN 8 - 23 mg/dL 12 21 16   Creatinine 0.61 - 1.24 mg/dL 1.020.98 7.251.02 3.660.85  Sodium 135 - 145 mmol/L 138 136 -  Potassium 3.5 - 5.1 mmol/L 4.0 3.8 3.8  Chloride 98 - 111 mmol/L 104 99 -  CO2 22 - 32 mmol/L 25 25 -  Calcium 8.9 - 10.3 mg/dL 7.6(L) 9.4 -  Total Protein 6.4 - 8.2 g/dL - - -  Total Bilirubin 0.2 - 1.0 mg/dL - - -  Alkaline Phos 50 - 136 Unit/L - - -  AST 15 - 37 Unit/L - - -  ALT 12 - 78 U/L - - -   CBC Latest Ref Rng & Units 08/28/2018 08/27/2018 03/07/2013  WBC 4.0 - 10.5 K/uL 16.1(H) 10.9(H) 8.4  Hemoglobin 13.0 - 17.0 g/dL 0.2(O) 37.8 58.8  Hematocrit 39.0 - 52.0 % 28.6(L) 39.6 41.5  Platelets 150 - 400 K/uL 229 226 212   ABG    Component Value Date/Time   PHART 7.364 08/27/2018 2238   PCO2ART 44.7 08/27/2018 2238   PO2ART 99.0 08/27/2018 2238   HCO3 25.5 08/27/2018 2238   TCO2 27 08/27/2018 2238   O2SAT 97.0 08/27/2018 2238   CBG (last 3)  No results for input(s): GLUCAP in the last 72 hours.  CC time 33 minutes  Coralyn Helling, MD [facility] Pulmonary/Critical Care 08/28/2018, 8:10 AM

## 2018-08-28 NOTE — Progress Notes (Signed)
Initial Nutrition Assessment  DOCUMENTATION CODES:   Morbid obesity  INTERVENTION:   Tube feeding via OG tube: - Vital High Protein @ 30 ml/hr (720 ml/day) - Pro-stat 60 ml QID - Continue liquid MVI  Tube feeding regimen provides 1520 kcal, 183 grams of protein, and 605 ml of H2O.   Tube feeding regimen and current propofol provides 2074 total kcal (101% of needs).  NUTRITION DIAGNOSIS:   Inadequate oral intake related to inability to eat as evidenced by NPO status.  GOAL:   Provide needs based on ASPEN/SCCM guidelines  MONITOR:   Vent status, TF tolerance, I & O's, Weight trends, Labs  REASON FOR ASSESSMENT:   Ventilator, Consult Enteral/tube feeding initiation and management  ASSESSMENT:   77 year old male who [facility] ED [facility]25 with weakness, SOB, and chest pressure. CTA showing L ICA occlusion. Code Stroke. Pt did not receive TPA because he takes Pradaxa. PMH significant for HTN, T2DM, HLD, atrial fibrillation, chronic lymphedema, GERD. [facility] and went to IR but unable to extract the clot.  Discuss pt with RN. Per RN, pt with episode of bleeding from sheath site last night.  Spoke with pt's wife at bedside who denies any nutrition-related issues PTA. Pt's wife states that pt ate well, "too much." Pt's wife shares that pt's weight has been at least 300 lbs over the last 20 years. Per pt's wife, pt has been ambulating less than usual due to hip pain.  Patient is currently intubated on ventilator support. Pt with OG tube in stomach. MV: 9.1 L/min Temp (24hrs), Avg:98.5 F (36.9 C), Min:97.6 F (36.4 C), Max:99.2 F (37.3 C) BP: 122/60 MAP: 75  Propofol: 21 ml/hr (provides 554 kcal daily) NS: 75 ml/hr Fentanyl: 15 ml/hr Neosynephrine: 210 ml/hr  Current TF order per Adult ICU TF Protocol: Vital High Protein @ 40 ml/hr Pro-stat 30 ml BID  Vital High Protein infusing at 20 ml/hr at time of RD visit. Per RN, plan was to increase  rate by 10 ml q 4 hours to goal rate of 40 ml/hr. RN started TF this AM, around 0930.  Medications reviewed and include: vitamin D-3 1000 units daily, Pepcid, SSI, levothyroxine, liquid MVI   Labs reviewed: HDL 24 (L), triglycerides 960164 (H), hemoglobin 9.2 (L), calcium 7.5 (L) CBG's: 154  UOP: 1735 ml x 24 hours I/O's: +3.2 L since admit  NUTRITION - FOCUSED PHYSICAL EXAM:    Most Recent Value  Orbital Region  No depletion  Upper Arm Region  No depletion  Thoracic and Lumbar Region  No depletion  Buccal Region  Unable to assess  Temple Region  No depletion  Clavicle Bone Region  No depletion  Clavicle and Acromion Bone Region  No depletion  Scapular Bone Region  Unable to assess  Dorsal Hand  No depletion  Patellar Region  No depletion  Anterior Thigh Region  No depletion  Posterior Calf Region  No depletion  Edema (RD Assessment)  Moderate [generalized]  Hair  Reviewed  Eyes  Unable to assess  Mouth  Unable to assess  Skin  Reviewed  Nails  Reviewed       Diet Order:   Diet Order            Diet NPO time specified  Diet effective now              EDUCATION NEEDS:   No education needs have been identified at this time  Skin:  Skin Assessment: Skin  Integrity Issues: Other: MASD to perineum, cellulitis to L and R legs  Last BM:  PTA/unknown  Height:   Ht Readings from Last 1 Encounters:  08/27/18 5\' 9"  (1.753 m)    Weight:   Wt Readings from Last 1 Encounters:  08/27/18 (!) 146.8 kg    Ideal Body Weight:  72.7 kg  BMI:  Body mass index is 47.79 kg/m.  Estimated Nutritional Needs:   Kcal:  9675-9163  Protein:  182-197 grams  Fluid:  1.8-2.0 L    Earma Reading, MS, RD, LDN Inpatient Clinical Dietitian Pager: [phone] Weekend/After Hours: [phone]

## 2018-08-28 NOTE — Progress Notes (Signed)
Spoke to E-Link MD about patient's fast, purposeful movement of left hand toward ETT. New orders received for a single wrist restraint. Also told E-Link MD about patient having wide QRS complexes. Patient was responsive and purposeful during episode of wide QRS complexes, and never lost a carotid or DP pulse. No new orders received at this time. Will continue to monitor.

## 2018-08-28 NOTE — Progress Notes (Signed)
Orthopedic Tech Progress Note Patient Details:  Darren Frazier July 20, 1941 [id number]  RN requested a 10lb sandbag    Patient ID: Darren Frazier, male   DOB: 30-Jun-1941, 77 y.o.   MRN: [id number]   Darren Frazier 08/28/2018, 7:15 AM

## 2018-08-28 NOTE — Progress Notes (Signed)
Spoke to E-Link about getting an abdominal x-ray order to confirm OG placement. Will continue to monitor.

## 2018-08-28 NOTE — Progress Notes (Signed)
Pt transported to and from 4N32 to  MRI on vent. Pt stable throughout with no complications.

## 2018-08-28 NOTE — Progress Notes (Signed)
STROKE TEAM PROGRESS NOTE   HISTORY OF PRESENT ILLNESS (per record) Darren Frazier is a 77 y.o. male  PMH of HTN, atrial fibrillation on Pradaxa last dose last evening, urethral stricture, cardiomyopathy, peripheral vascular disease, hypertension, hyperlipidemia, stroke with some balance issues, bilateral lymphedema vitamin D deficiency [facility] for evaluation of right lower extremity numbness and weakness with last known normal around 8:30 AM today. He was evaluated by telemedicine neurology, given NIH stroke scale of 4 and vascular imaging was recommended. 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  No known history of ICA occlusion.  I was called regarding this patient's potential transfer in IR evaluation.  I recommended getting a CT perfusion study which was done that showed a large penumbra with no core in the left cerebral hemisphere. He was transferred for further work-up and diagnostic angiogram and possible thrombectomy [facility]   LKW: 8:30 AM on 08/27/2018 tpa given?: no, on Pradaxa last dose last evening Premorbid modified Rankin scale (mRS): 2 from peripheral vascular disease   Cerebral Angiogram With Attempted Intervention 08/27/2018 S/P bilateral commonn carotid and Lt VA arteriogram followed by attempted revascularization of symptomatic acute occlusion of Lt ICa prox. 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 3.Severte stenosis of dominant LT VBJ distal to Lt PICA  4.Occluded RT VA prox.    SUBJECTIVE (INTERVAL HISTORY) Son and Wife at bedside.  Last night he became aphasic and right hemiplegic and required intubation.  He is not on any antiplatelets at this time due to some groin bleeding from the angio.  He was on Pradaxa as outpatient for a. Fib.      OBJECTIVE Vitals:   08/27/18 2200 08/28/18 0019 08/28/18 0350 08/28/18 0400   BP: 96/63 (!) 119/56 (!) 108/49   Pulse: 62 73 86   Resp: 16 (!) 23 16   Temp:    98.6 F (37 C)  TempSrc:    Axillary  SpO2: 96% 99% 98%   Weight:      Height:        CBC:  Recent Labs  Lab 08/27/18 1122 08/28/18 0600  WBC 10.9* 16.1*  NEUTROABS  --  12.4*  HGB 13.0 9.2*  HCT 39.6 28.6*  MCV 97.5 99.0  PLT 226 229    Basic Metabolic Panel:  Recent Labs  Lab 08/27/18 1122 08/28/18 0600  NA 136 138  K 3.8 4.0  CL 99 104  CO2 25 25  GLUCOSE 190* 140*  BUN 21 12  CREATININE 1.02 0.98  CALCIUM 9.4 7.6*    Lipid Panel:     Component Value Date/Time   CHOL 138 08/28/2018 0600   TRIG 164 (H) 08/28/2018 0600   HDL 24 (L) 08/28/2018 0600   CHOLHDL 5.8 08/28/2018 0600   VLDL 33 08/28/2018 0600   LDLCALC 81 08/28/2018 0600   HgbA1c: No results found for: HGBA1C Urine Drug Screen: No results found for: LABOPIA, COCAINSCRNUR, LABBENZ, AMPHETMU, THCU, LABBARB  Alcohol Level No results found for: ETH    IMAGING   Ct Angio Head W Or Wo Contrast Ct Angio Neck W Or Wo Contrast 08/27/2018 IMPRESSION:   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   Severe irregular atherosclerotic disease of the right carotid bifurcation with stenosis of 80% in the bulb region and 30% in the distal ICA.   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 50% stenosis of the left vertebral artery origin.   Severe atherosclerotic disease in both carotid siphon regions.   Right siphon stenosis estimated at 50-70%.   Reconstituted flow in the distal left siphon and supraclinoid region. 80% supraclinoid stenosis.   Flow in both anterior and middle cerebral artery territories.   Stenosis of both M1 segments estimated at 50-70%.   I do not identify any large or medium vessel occlusion acutely within the MCA branch vessels.   Severely diseased left V4 segment with serial stenoses proximal to the basilar estimated at 70%.   Basilar atherosclerotic irregularity  without flow limiting stenosis.   Posterior circulation branch vessels do show flow, including right PICA which is supplied by retrograde flow in the distal right vertebral artery.     Dg Chest 2 View 08/27/2018 IMPRESSION:  Mild cardiomegaly and diffuse interstitial edema, consistent with congestive heart failure.   Ct Cerebral Perfusion W Contrast 08/27/2018 IMPRESSION:   155 mL of delayed perfusion in the left MCA territory. No core infarction.   Delayed perfusion left MCA territory may be due to severe intracranial atherosclerotic disease as well as occlusion of the left internal carotid artery.   Age of the internal carotid artery occlusion is indeterminate.   It is possible this delayed perfusion is a chronic or possibly acute finding.   Dg Chest Port 1 View  08/28/2018 IMPRESSION:  Interval intubation with the endotracheal tube at the level of the carina. This should be withdrawn 2-3 cm. Stable edema bilaterally.    Ct Head Code Stroke Wo Contrast` 08/27/2018 IMPRESSION:  1. No acute finding by CT. 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  2. 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 No acute finding is suspected by CT.     Cerebral Angiogram With Attempted Intervention 08/27/2018 S/P bilateral commonn carotid and Lt VA arteriogram followed by attempted revascularization of symptomatic acute occlusion of Lt ICa prox. 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 3.Severte stenosis of dominant LT VBJ distal to Lt PICA  4.Occluded RT VA prox.    Transthoracic Echocardiogram  00/00/00 Pending       PHYSICAL EXAM Blood pressure (!) 108/49, pulse 86, temperature 98.6 F (37 C), temperature source Axillary, resp. rate 16, height 5\' 9"  (1.753 m), weight (!) 146.8 kg, SpO2 98 %.   Fentanyl and Propofol  stopped prior to exam.  Intubated.   Opens eyes to command.  He does squeeze with left hand and moves left leg to command.  He does not show 2 fingers as instructed on the left.  No movement on the RUE and RLE.  No withdrawal to pain on the right.    ASSESSMENT/PLAN Mr. Darren Frazier is a 77 y.o. male with history of HTN, atrial fibrillation on Pradaxa last dose last evening, urethral stricture, cardiomyopathy, peripheral vascular disease, hypertension, hyperlipidemia, stroke with some balance issues, bilateral lymphedema vitamin D deficiency presenting with right lower extremity numbness and weakness and suspected new Lt ICA occlusion. He did not receive IV t-PA due to late presentation.  Stroke:  Lt MCA territory infarct - MRI pending  Resultant  Aphasia and right hemiparesis.  CT head - No acute finding by CT. Multiple old infarcts.  MRI head - pending  MRA head - not performed  CTA H&N - diffuse disease as above.  CT  Perfusion - 155 mL of delayed perfusion in the left MCA territory. No core infarction.   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  2D Echo - pending  LDL - 81  HgbA1c - pending  UDS - not performed  VTE prophylaxis - SCDs  Diet - NPO  aspirin 81 mg daily and Pradaxa (dabigatran) twice a day prior to admission, now on No antithrombotic  Patient counseled to be compliant with his antithrombotic medications  Ongoing aggressive stroke risk factor management  Therapy recommendations:  pending  Disposition:  Pending  Hypertension  BP running low - Cleviprex -> phenyephrine . Permissive hypertension (OK if < 220/120) but gradually normalize in 5-7 days . Long-term BP goal normotensive  Hyperlipidemia  Lipid lowering  medication PTA: none  LDL 81, goal < 70  Current lipid lowering medication: none (statin intolerant - abnormal labs)  Continue statin at discharge  Diabetes  HgbA1c pending, goal < 7.0  Unc / Controlled  Other Stroke Risk Factors  Advanced age  Former cigarette smoker - quit  Obesity, Body mass index is 47.79 kg/m., recommend weight loss, diet and exercise as appropriate   Hx stroke/TIA  Atrial fibrillation  Cardiomyopathy  Other Active Problems  Anemia - recheck in AM  Diffuse cerebrovascular disease  Mildly low BP - avoid hypotension due to areas of stenosis  Leukocytosis - afebrile - recheck in AM  Cardiomyopathy / CHF   Plan  Antiplatelet therapy  Avoid hypotension - but watch for worsening CHF - await echo - Cleviprex -> phenyephrine   Hospital day # 1  A/P:  Angiogram showed severe intracranial and extracranial disease in both ICA and basilar and left MCA. He likely had atheroembolism from the left ICA to the left MCA causing the decline last night.  I am awaiting the MRI Brain later today to assess the degree of infarct.  After that, I will likely start him on antiplatelet therapy unless he has some hemorrhagic conversion.    Weston Settle, MS, MD  To contact Stroke Continuity provider, please refer to [email or web address]. After hours, contact General Neurology

## 2018-08-29 ENCOUNTER — Encounter (HOSPITAL_COMMUNITY): Payer: Self-pay | Admitting: Acute Care

## 2018-08-29 ENCOUNTER — Inpatient Hospital Stay (HOSPITAL_COMMUNITY): Payer: Medicare HMO

## 2018-08-29 DIAGNOSIS — I4891 Unspecified atrial fibrillation: Secondary | ICD-10-CM

## 2018-08-29 DIAGNOSIS — I480 Paroxysmal atrial fibrillation: Secondary | ICD-10-CM

## 2018-08-29 DIAGNOSIS — K661 Hemoperitoneum: Secondary | ICD-10-CM

## 2018-08-29 HISTORY — PX: CENTRAL LINE: PRO85

## 2018-08-29 LAB — HEMOGLOBIN A1C
Hgb A1c MFr Bld: 7.1 % — ABNORMAL HIGH (ref 4.8–5.6)
Mean Plasma Glucose: 157 mg/dL

## 2018-08-29 LAB — TROPONIN I
TROPONIN I: 6.39 ng/mL — AB (ref <0.03)
Troponin I: 3.59 ng/mL (ref <0.03)
Troponin I: 15.82 ng/mL (ref <0.03)

## 2018-08-29 LAB — CBC
HCT: 26.9 % — ABNORMAL LOW (ref 39.0–52.0)
HCT: 32.8 % — ABNORMAL LOW (ref 39.0–52.0)
HEMOGLOBIN: 8.7 g/dL — AB (ref 13.0–17.0)
Hemoglobin: 10.4 g/dL — ABNORMAL LOW (ref 13.0–17.0)
MCH: 32.7 pg (ref 26.0–34.0)
MCH: 33.4 pg (ref 26.0–34.0)
MCHC: 32.3 g/dL (ref 30.0–36.0)
MCHC: 31.7 g/dL (ref 30.0–36.0)
MCV: 101.1 fL — ABNORMAL HIGH (ref 80.0–100.0)
MCV: 105.5 fL — ABNORMAL HIGH (ref 80.0–100.0)
Platelets: 165 10*3/uL (ref 150–400)
Platelets: 233 10*3/uL (ref 150–400)
RBC: 2.66 MIL/uL — ABNORMAL LOW (ref 4.22–5.81)
RBC: 3.11 MIL/uL — AB (ref 4.22–5.81)
RDW: 13.9 % (ref 11.5–15.5)
RDW: 14.9 % (ref 11.5–15.5)
WBC: 14.2 10*3/uL — ABNORMAL HIGH (ref 4.0–10.5)
WBC: 22.2 10*3/uL — ABNORMAL HIGH (ref 4.0–10.5)
nRBC: 0 % (ref 0.0–0.2)
nRBC: 0.3 % — ABNORMAL HIGH (ref 0.0–0.2)

## 2018-08-29 LAB — BASIC METABOLIC PANEL
Anion gap: 12 (ref 5–15)
BUN: 11 mg/dL (ref 8–23)
CO2: 21 mmol/L — ABNORMAL LOW (ref 22–32)
Calcium: 7.9 mg/dL — ABNORMAL LOW (ref 8.9–10.3)
Chloride: 103 mmol/L (ref 98–111)
Creatinine, Ser: 1.23 mg/dL (ref 0.61–1.24)
GFR calc Af Amer: >60 mL/min (ref >60)
GFR calc non Af Amer: 56 mL/min — ABNORMAL LOW (ref >60)
Glucose, Bld: 232 mg/dL — ABNORMAL HIGH (ref 70–99)
Potassium: 4.1 mmol/L (ref 3.5–5.1)
Sodium: 136 mmol/L (ref 135–145)

## 2018-08-29 LAB — GLUCOSE, CAPILLARY
GLUCOSE-CAPILLARY: 229 mg/dL — AB (ref 70–99)
GLUCOSE-CAPILLARY: 222 mg/dL — AB (ref 70–99)
Glucose-Capillary: 182 mg/dL — ABNORMAL HIGH (ref 70–99)
Glucose-Capillary: 198 mg/dL — ABNORMAL HIGH (ref 70–99)
Glucose-Capillary: 253 mg/dL — ABNORMAL HIGH (ref 70–99)

## 2018-08-29 LAB — PHOSPHORUS
Phosphorus: 3.2 mg/dL (ref 2.5–4.6)
Phosphorus: 2.2 mg/dL — ABNORMAL LOW (ref 2.5–4.6)

## 2018-08-29 LAB — ABO/RH: ABO/RH(D): O NEG

## 2018-08-29 LAB — PREPARE RBC (CROSSMATCH)

## 2018-08-29 LAB — MAGNESIUM
MAGNESIUM: 1.6 mg/dL — AB (ref 1.7–2.4)
Magnesium: 1.7 mg/dL (ref 1.7–2.4)

## 2018-08-29 MED ORDER — DEXMEDETOMIDINE HCL IN NACL 400 MCG/100ML IV SOLN
0.4000 ug/kg/h | INTRAVENOUS | Status: DC
  Administered 2018-08-29 (×3): 0.6 ug/kg/h via INTRAVENOUS
  Administered 2018-08-29: 1 ug/kg/h via INTRAVENOUS
  Administered 2018-08-30: 0.8 ug/kg/h via INTRAVENOUS
  Administered 2018-08-30: 0.9 ug/kg/h via INTRAVENOUS
  Administered 2018-08-30 – 2018-08-31 (×8): 1 ug/kg/h via INTRAVENOUS
  Administered 2018-08-31: 0.7 ug/kg/h via INTRAVENOUS
  Administered 2018-08-31: 0.8 ug/kg/h via INTRAVENOUS
  Administered 2018-08-31: 1 ug/kg/h via INTRAVENOUS
  Administered 2018-08-31: 0.613 ug/kg/h via INTRAVENOUS
  Administered 2018-08-31: 0.6 ug/kg/h via INTRAVENOUS
  Administered 2018-09-01 (×3): 0.8 ug/kg/h via INTRAVENOUS
  Filled 2018-08-29 (×22): qty 100

## 2018-08-29 MED ORDER — PHENYLEPHRINE HCL-NACL 10-0.9 MG/250ML-% IV SOLN
INTRAVENOUS | Status: AC
  Filled 2018-08-29: qty 250

## 2018-08-29 MED ORDER — IOHEXOL 300 MG/ML  SOLN
100.0000 mL | Freq: Once | INTRAMUSCULAR | Status: AC | PRN
  Administered 2018-08-29: 100 mL via INTRAVENOUS

## 2018-08-29 MED ORDER — NOREPINEPHRINE 16 MG/250ML-% IV SOLN
0.0000 ug/min | INTRAVENOUS | Status: DC
  Administered 2018-08-29: 35 ug/min via INTRAVENOUS
  Filled 2018-08-29: qty 250

## 2018-08-29 MED ORDER — AMIODARONE HCL IN DEXTROSE 360-4.14 MG/200ML-% IV SOLN
60.0000 mg/h | INTRAVENOUS | Status: DC
  Filled 2018-08-29: qty 200

## 2018-08-29 MED ORDER — DIGOXIN 0.25 MG/ML IJ SOLN
0.5000 mg | Freq: Once | INTRAMUSCULAR | Status: AC
  Administered 2018-08-29: 0.5 mg via INTRAVENOUS
  Filled 2018-08-29: qty 2

## 2018-08-29 MED ORDER — AMIODARONE LOAD VIA INFUSION
150.0000 mg | Freq: Once | INTRAVENOUS | Status: DC
  Filled 2018-08-29: qty 83.34

## 2018-08-29 MED ORDER — DEXMEDETOMIDINE HCL IN NACL 200 MCG/50ML IV SOLN
0.4000 ug/kg/h | INTRAVENOUS | Status: DC
  Administered 2018-08-29: 0.8 ug/kg/h via INTRAVENOUS
  Administered 2018-08-29: 0.6 ug/kg/h via INTRAVENOUS
  Administered 2018-08-29 (×2): 0.4 ug/kg/h via INTRAVENOUS
  Filled 2018-08-29 (×5): qty 50

## 2018-08-29 MED ORDER — AMIODARONE HCL IN DEXTROSE 360-4.14 MG/200ML-% IV SOLN: 30.0000 mg/h | INTRAVENOUS | Status: DC

## 2018-08-29 MED ORDER — PIPERACILLIN-TAZOBACTAM 3.375 G IVPB
3.3750 g | Freq: Three times a day (TID) | INTRAVENOUS | Status: DC
  Administered 2018-08-29 – 2018-09-02 (×12): 3.375 g via INTRAVENOUS
  Filled 2018-08-29 (×12): qty 50

## 2018-08-29 MED ORDER — PHENYLEPHRINE HCL-NACL 40-0.9 MG/250ML-% IV SOLN
0.0000 ug/min | INTRAVENOUS | Status: DC
  Administered 2018-08-29: 400 ug/min via INTRAVENOUS
  Administered 2018-08-29: 350 ug/min via INTRAVENOUS
  Administered 2018-08-29: 400 ug/min via INTRAVENOUS
  Administered 2018-08-29: 340 ug/min via INTRAVENOUS
  Administered 2018-08-29: 265 ug/min via INTRAVENOUS
  Administered 2018-08-29: 400 ug/min via INTRAVENOUS
  Administered 2018-08-29: 350 ug/min via INTRAVENOUS
  Administered 2018-08-30: 220 ug/min via INTRAVENOUS
  Administered 2018-08-30: 150 ug/min via INTRAVENOUS
  Administered 2018-08-30: 90 ug/min via INTRAVENOUS
  Administered 2018-08-30: 160 ug/min via INTRAVENOUS
  Administered 2018-08-31: 40 ug/min via INTRAVENOUS
  Administered 2018-08-31 – 2018-09-01 (×2): 60 ug/min via INTRAVENOUS
  Administered 2018-09-01: 40 ug/min via INTRAVENOUS
  Filled 2018-08-29 (×4): qty 250
  Filled 2018-08-29: qty 40
  Filled 2018-08-29 (×2): qty 250
  Filled 2018-08-29: qty 40
  Filled 2018-08-29 (×4): qty 250
  Filled 2018-08-29: qty 40
  Filled 2018-08-29 (×8): qty 250
  Filled 2018-08-29 (×2): qty 40

## 2018-08-29 NOTE — Progress Notes (Addendum)
PT Cancellation Note  Patient Details Name: Darren Frazier MRN: [id number] DOB: 10-16-1940   Cancelled Treatment:    Reason Eval/Treat Not Completed: Patient not medically ready; patient remains intubated and not ready for PT.  Noted MD cancelled order.  Will sign off and await new order.   Elray Mcgregor 08/29/2018, 9:52 AM  Sheran Lawless, PT Acute Rehabilitation Services [phone] 08/29/2018

## 2018-08-29 NOTE — Progress Notes (Signed)
Spoke to Neuro MD Lindzen. I explained to Dr. Otelia Limes how I am having trouble getting the patient's blood pressure to the wanted systolic of 140, even with the levophed running at the max. With sedation off, the patient became agitated and restless. Lindzen did not want to start a second vasopressor concurrently due to patient's edema and potential fluid overload.  It was decided to switch from propofol to precedex in an attempt to raise blood pressure but keep sedation. Patient then went into afib/RVR with rates from 130s to 150s. I paged Lindzen again and explained the situation, and told him that the patient's strips look like a bundle branch block. Given everything going on, Lindzen consulted with CCM and cardiology. New orders were received for a 12 lead ECG and a troponin lab. Cardiology also suggested to give digoxin. Orders received for digoxin. Digoxin was given. Cardiology MD came up to bedside to further assess the patient. After Cardiology assessed the patient and spoke with Lindzen, it was decided to switch the vasopressor back to phenylephrine, as levophed can cause increased heart rate. Will continue to monitor.

## 2018-08-29 NOTE — Procedures (Signed)
Central Venous Catheter Insertion Procedure Note Skip Marez [id number] 1940/10/12  Procedure: Insertion of Central Venous Catheter Indications: Drug and/or fluid administration  Procedure Details Consent: Risks of procedure as well as the alternatives and risks of each were explained to the (patient/caregiver).  Consent for procedure obtained. Time Out: Verified patient identification, verified procedure, site/side was marked, verified correct patient position, special equipment/implants available, medications/allergies/relevent history reviewed, required imaging and test results available.  Performed  Maximum sterile technique was used including antiseptics, cap, gloves, gown, hand hygiene, mask and sheet. Skin prep: Chlorhexidine; local anesthetic administered A antimicrobial bonded/coated triple lumen catheter was placed in the right internal jugular vein using the Seldinger technique. Catheter placed to 16 cm. Blood aspirated via all 3 ports and then flushed x 3. Line sutured x 3 and dressing applied.  Ultrasound guidance used.Yes.    Evaluation Blood flow good Complications: No apparent complications Patient did tolerate procedure well. Chest X-ray ordered to verify placement.  CXR: pending.  Tessie Fass MSN, AGACNP-BC Pulmonology Critical Care Medicine  11:28 AM

## 2018-08-29 NOTE — Progress Notes (Signed)
Pt transported to and from 4N32 to CT3 on ventilator. Pt stable throughout without complications. VS within normal limits.

## 2018-08-29 NOTE — Progress Notes (Signed)
Supervising Physician: Julieanne Cotton  Patient Status:  [facility] - In-pt  Chief Complaint: Follow-up left ICA occlusion with attempted intervention 08/27/18  Subjective:  Patient remains sedated and intubated, strong purposeful movements on the left side - no movement or response to pain on the right side per RN. Cardiology was consulted overnight due to ongoing hypotension with pressor support, a.fib with RVR and elevated troponin (3.59) in the setting of RBBB - his levophed was changed to phenylephrine, he was given 500 mcg of digoxin and serial troponins were ordered.  MR brain w/o contrast performed yesterday showing scattered multifocal acute ischemic left MCA territory infarcts, likely embolic in nature; possible faint scattered associated petechial hemorrhage without frank hemorrhagic transformation or significant mass effect. Abnormal flow void within the left ICA to is cavernous segment c/w known left ICA occlusion. 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 Moderately advanced cerebral atrophy with chronic small vessel ischemic disease.  No family members at bedside during exam today.   Allergies: Ace inhibitors; Omeprazole; and Statins  Medications: Prior to Admission medications   Medication Sig Start Date End Date Taking? Authorizing Provider  amLODipine (NORVASC) 10 MG tablet Take 10 mg by mouth daily.  02/06/17  Yes [provider]  bisoprolol (ZEBETA) 10 MG tablet Take 10 mg by mouth daily.  05/29/16  Yes [provider]  dabigatran (PRADAXA) 150 MG CAPS capsule Take 150 mg by mouth 2 (two) times daily.  11/21/16  Yes [provider]  doxazosin (CARDURA) 2 MG tablet Take 2 mg by mouth daily.  02/06/17  Yes [provider]  hydrochlorothiazide (HYDRODIURIL) 25 MG tablet Take 25 mg by mouth daily.  07/30/12  Yes [provider]  levothyroxine (SYNTHROID,  LEVOTHROID) 88 MCG tablet Take 88 mcg by mouth daily before breakfast.  03/22/17  Yes [provider]  losartan (COZAAR) 100 MG tablet Take 100 mg by mouth daily.  07/30/12  Yes [provider]  meloxicam (MOBIC) 15 MG tablet Take 15 mg by mouth daily.    Yes [provider]  metFORMIN (GLUCOPHAGE) 1000 MG tablet Take 1,000 mg by mouth 2 (two) times daily with a meal.  02/06/17  Yes [provider]  ranitidine (ZANTAC) 150 MG tablet Take 150 mg by mouth 2 (two) times daily.   Yes [provider]  aspirin 81 MG chewable tablet Chew 81 mg by mouth daily.     [provider]  Calcium Citrate (CITRACAL PO) Take 1 tablet by mouth 2 (two) times daily.    [provider]  Cholecalciferol (VITAMIN D3) 1000 units CAPS Take 1,000 Units by mouth daily.     [provider]  famotidine (PEPCID) 20 MG tablet Take 20 mg by mouth 2 (two) times daily.    [provider]  fluticasone (FLONASE) 50 MCG/ACT nasal spray Place 2 sprays into both nostrils daily as needed for allergies or rhinitis.     [provider]  gabapentin (NEURONTIN) 100 MG capsule Take 200 mg by mouth 2 (two) times daily.  03/05/17   [provider]  Multiple Vitamin (MULTIVITAMIN) capsule Take 1 capsule by mouth daily.     [provider]  Omega-3 Fatty Acids (FISH OIL) 1200 MG CAPS Take 1,200 mg by mouth 3 (three) times daily.     [provider]  vitamin C (ASCORBIC ACID) 500 MG tablet Take 500 mg by mouth daily.    [provider]     Vital Signs: BP 112/60   Pulse (!) 105   Temp (!) 100.8 F (38.2 C) (Axillary) Comment: tylenol given  Resp (!) 23   Ht 5\' 9"  (1.753 m)   Wt (!) 313 lb 15 oz (142.4 kg)   SpO2 91%   BMI 46.36 kg/m   Physical Exam Vitals signs and nursing note reviewed.  Constitutional:      Appearance: He is obese. He is ill-appearing.     Comments: Intubated, sedated.  HENT:     Head:  Normocephalic and atraumatic.  Cardiovascular:     Rate and Rhythm: Tachycardia present.  Pulmonary:     Comments: Intubated. ventilated Skin:    General: Skin is warm and dry.     Imaging: Ct Angio Head W Or Wo Contrast  Result Date: 08/27/2018 CLINICAL DATA:  Acute presentation with speech disturbance. EXAM: CT ANGIOGRAPHY HEAD AND NECK TECHNIQUE: Multidetector CT imaging of the head and neck was performed using the standard protocol during bolus administration of intravenous contrast. Multiplanar CT image reconstructions and MIPs were obtained to evaluate the vascular anatomy. Carotid stenosis measurements (when applicable) are obtained utilizing NASCET criteria, using the distal internal carotid diameter as the denominator. CONTRAST:  75mL ISOVUE-370 IOPAMIDOL (ISOVUE-370) INJECTION 76% COMPARISON:  CT same day. FINDINGS: CTA NECK FINDINGS Aortic arch: Aortic atherosclerosis.  No aneurysm or dissection. Right carotid system: Common carotid artery shows some atherosclerotic plaque but is widely patent to the bifurcation. There is soft and calcified plaque affecting the carotid bifurcation and ICA bulb region. There is severe stenosis in the distal bulb region with luminal diameter of 1 mm. The vessel is quite tortuous beyond that but widely patent to the upper cervical region, where there is soft and calcified plaque resulting in minimal diameter of 3.5 mm. Compared to an expected diameter of 5 mm, stenosis in the bulb is 80% or greater. Stenosis in the upper cervical region is 30%. Left carotid system: Common carotid artery shows atherosclerotic plaque but is sufficiently patent to the bifurcation region. There is calcified and soft plaque at the carotid bifurcation and ICA bulb. There is left internal carotid artery occlusion at the distal bulb. 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 The left vertebral artery shows 50% stenosis at its origin but is sufficiently  patent beyond that through the cervical region to the foramen magnum. Skeleton: Mid cervical spondylosis. Other neck: No mass or lymphadenopathy. Upper chest: Interstitial prominence which could go along with fluid overload or mild edema. Review of the MIP images confirms the above findings CTA HEAD FINDINGS Anterior circulation: Left internal carotid artery is occluded without antegrade flow through the skull base. Right internal carotid artery shows atherosclerotic disease in the carotid siphon region with stenosis estimated at 50-70%. The anterior and middle cerebral vessels are patent. There is atherosclerotic irregularity of the M1 segment with stenosis of 50-70%. No acute vessel occlusion is identified. There is a chronic punctate calcified embolus in 1 of the insular branches, but this was present in 2014. On the left, there is reconstituted flow in the distal siphon. Severe stenosis of the supraclinoid ICA, 80% or greater. Flow is present in the anterior and middle cerebral vessels, secondary to reconstituted flow and flow through communicating arteries. There is 50% stenosis in the M1 segment. I do not see any occluded or missing large or medium vessels in the MCA territory. Posterior circulation: Right vertebral artery shows no antegrade flow at the  foramen magnum. There is retrograde flow in the distal right vertebral. Left vertebral artery is patent at the foramen magnum. There is atherosclerotic disease of the V4 segment with stenosis estimated at 70%. There are serial stenoses. The vessel does show flow to the basilar, which shows atherosclerotic irregularity but is patent. Flow is present in the superior cerebellar and posterior cerebral arteries. Venous sinuses: Patent and normal. Anatomic variants: None significant. Delayed phase: No abnormal enhancement. 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 Severe irregular  atherosclerotic disease of the right carotid bifurcation with stenosis of 80% in the bulb region and 30% in the distal ICA. 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 50% stenosis of the left vertebral artery origin. Severe atherosclerotic disease in both carotid siphon regions. Right siphon stenosis estimated at 50-70%. Reconstituted flow in the distal left siphon and supraclinoid region. 80% supraclinoid stenosis. Flow in both anterior and middle cerebral artery territories. Stenosis of both M1 segments estimated at 50-70%. I do not identify any large or medium vessel occlusion acutely within the MCA branch vessels. Severely disease left V4 segment with serial stenoses proximal to the basilar estimated at 70%. Basilar atherosclerotic irregularity without flow limiting stenosis. Posterior circulation branch vessels do show flow, including right PICA which is supplied by retrograde flow in the distal right vertebral artery. Electronically Signed   By: Paulina FusiMark  Shogry M.D.   On: 08/27/2018 13:14   Dg Chest 2 View  Result Date: 08/27/2018 CLINICAL DATA:  Chest pain and shortness of breath.  Cardiomyopathy. EXAM: CHEST - 2 VIEW COMPARISON:  03/07/2013 FINDINGS: Mild cardiomegaly. Aortic atherosclerosis. Diffuse interstitial infiltrates, consistent with pulmonary edema. Mild subsegmental atelectasis also seen in the left upper lobe. No evidence of pulmonary consolidation or significant pleural effusion. IMPRESSION: Mild cardiomegaly and diffuse interstitial edema, consistent with congestive heart failure. Electronically Signed   By: Myles RosenthalJohn  Stahl M.D.   On: 08/27/2018 12:02   Ct Angio Neck W Or Wo Contrast  Result Date: 08/27/2018 CLINICAL DATA:  Acute presentation with speech disturbance. EXAM: CT ANGIOGRAPHY HEAD AND NECK TECHNIQUE: Multidetector CT imaging of the head and neck was performed using the standard protocol during bolus administration of intravenous contrast. Multiplanar CT image reconstructions  and MIPs were obtained to evaluate the vascular anatomy. Carotid stenosis measurements (when applicable) are obtained utilizing NASCET criteria, using the distal internal carotid diameter as the denominator. CONTRAST:  75mL ISOVUE-370 IOPAMIDOL (ISOVUE-370) INJECTION 76% COMPARISON:  CT same day. FINDINGS: CTA NECK FINDINGS Aortic arch: Aortic atherosclerosis.  No aneurysm or dissection. Right carotid system: Common carotid artery shows some atherosclerotic plaque but is widely patent to the bifurcation. There is soft and calcified plaque affecting the carotid bifurcation and ICA bulb region. There is severe stenosis in the distal bulb region with luminal diameter of 1 mm. The vessel is quite tortuous beyond that but widely patent to the upper cervical region, where there is soft and calcified plaque resulting in minimal diameter of 3.5 mm. Compared to an expected diameter of 5 mm, stenosis in the bulb is 80% or greater. Stenosis in the upper cervical region is 30%. Left carotid system: Common carotid artery shows atherosclerotic plaque but is sufficiently patent to the bifurcation region. There is calcified and soft plaque at the carotid bifurcation and ICA bulb. There is left internal carotid artery occlusion at the distal bulb. 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 The left  vertebral artery shows 50% stenosis at its origin but is sufficiently patent beyond that through the cervical region to the foramen magnum. Skeleton: Mid cervical spondylosis. Other neck: No mass or lymphadenopathy. Upper chest: Interstitial prominence which could go along with fluid overload or mild edema. Review of the MIP images confirms the above findings CTA HEAD FINDINGS Anterior circulation: Left internal carotid artery is occluded without antegrade flow through the skull base. Right internal carotid artery shows atherosclerotic disease in the carotid siphon region with stenosis estimated at 50-70%.  The anterior and middle cerebral vessels are patent. There is atherosclerotic irregularity of the M1 segment with stenosis of 50-70%. No acute vessel occlusion is identified. There is a chronic punctate calcified embolus in 1 of the insular branches, but this was present in 2014. On the left, there is reconstituted flow in the distal siphon. Severe stenosis of the supraclinoid ICA, 80% or greater. Flow is present in the anterior and middle cerebral vessels, secondary to reconstituted flow and flow through communicating arteries. There is 50% stenosis in the M1 segment. I do not see any occluded or missing large or medium vessels in the MCA territory. Posterior circulation: Right vertebral artery shows no antegrade flow at the foramen magnum. There is retrograde flow in the distal right vertebral. Left vertebral artery is patent at the foramen magnum. There is atherosclerotic disease of the V4 segment with stenosis estimated at 70%. There are serial stenoses. The vessel does show flow to the basilar, which shows atherosclerotic irregularity but is patent. Flow is present in the superior cerebellar and posterior cerebral arteries. Venous sinuses: Patent and normal. Anatomic variants: None significant. Delayed phase: No abnormal enhancement. 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 Severe irregular atherosclerotic disease of the right carotid bifurcation with stenosis of 80% in the bulb region and 30% in the distal ICA. 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 50% stenosis of the left vertebral artery origin. Severe atherosclerotic disease in both carotid siphon regions. Right siphon stenosis estimated at 50-70%. Reconstituted flow in the distal left siphon and supraclinoid region. 80% supraclinoid stenosis. Flow in both anterior and middle cerebral artery territories. Stenosis of both M1 segments estimated at 50-70%. I do not  identify any large or medium vessel occlusion acutely within the MCA branch vessels. Severely disease left V4 segment with serial stenoses proximal to the basilar estimated at 70%. Basilar atherosclerotic irregularity without flow limiting stenosis. Posterior circulation branch vessels do show flow, including right PICA which is supplied by retrograde flow in the distal right vertebral artery. Electronically Signed   By: Paulina Fusi M.D.   On: 08/27/2018 13:14   Mr Brain Wo Contrast  Result Date: 08/28/2018 CLINICAL DATA:  Follow-up examination for acute stroke, right lower extremity weakness. Known left ICA occlusion. EXAM: MRI HEAD WITHOUT CONTRAST TECHNIQUE: Multiplanar, multiecho pulse sequences of the brain and surrounding structures were obtained without intravenous contrast. COMPARISON:  Prior CTs from 08/27/2018. FINDINGS: Brain: Generalized age-related cerebral atrophy. Patchy and confluent T2/FLAIR hyperintensity within the periventricular and deep white matter both cerebral hemispheres most consistent with chronic microvascular ischemic disease, moderate nature. Scatter remote cortical infarcts involving the posterior left frontoparietal region, right parietal lobe, and right frontal lobe are seen. Small remote right cerebellar infarct. Remote lacunar infarct present at the left caudate head. Scattered chronic hemosiderin staining seen about several of these infarcts. Scattered multifocal foci of restricted diffusion involving the cortical subcortical  left frontal, parietal, and temporal occipital regions, compatible with acute ischemic left MCA territory infarcts. Largest area of infarction seen at the high left frontal parietal region and measures 2.5 cm (series 3, image 48). Possible faint petechial hemorrhage about a few of these infarcts without frank hemorrhagic transformation or significant mass effect. Findings likely embolic in nature. No mass lesion, midline shift or mass effect. Mild  diffuse ventricular prominence related global parenchymal volume loss without hydrocephalus. No extra-axial fluid collection. Pituitary gland within normal limits. Vascular: Abnormal flow void within the left ICA to its cavernous segment, compatible with previously identified left ICA occlusion. Major intracranial vascular flow voids otherwise maintained at the skull base. Skull and upper cervical spine: Craniocervical junction within normal limits. Upper cervical spine normal. Bone marrow signal intensity within normal limits. No scalp soft tissue abnormality. Sinuses/Orbits: Patient status post bilateral ocular lens replacement. Globes and orbital soft tissues demonstrate no acute finding. Scattered mucosal thickening seen throughout the paranasal sinuses with superimposed scattered air-fluid levels. Fluid seen within the nasopharynx. Patient likely intubated. Trace bilateral mastoid effusions noted. Inner ear structures normal. Other: None. IMPRESSION: 1. 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 Possible faint scattered associated petechial hemorrhage without frank hemorrhagic transformation or significant mass effect. 2. Abnormal flow void within the left ICA to its cavernous segment, consistent with known left ICA occlusion. 3. 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 4. Moderately advanced cerebral atrophy with chronic small vessel ischemic disease. Electronically Signed   By: Rise Mu M.D.   On: 08/28/2018 15:33   Ct Cerebral Perfusion W Contrast  Result Date: 08/27/2018 CLINICAL DATA:  Right-sided weakness onset this morning. EXAM: CT PERFUSION BRAIN TECHNIQUE: Multiphase CT imaging of the brain was performed following IV bolus contrast injection. Subsequent parametric perfusion maps were calculated using RAPID software. CONTRAST:  40mL ISOVUE-370  IOPAMIDOL (ISOVUE-370) INJECTION 76% COMPARISON:  CT a head today FINDINGS: CT Brain Perfusion Findings: CBF (<30%) Volume: 0mL Perfusion (Tmax>6.0s) volume: Mismatch Volume: ASPECTS on noncontrast CT Head today, not calculated due to extensive chronic ischemic change. No definite acute infarct on CT. Infarct Core: 0 mL Infarction Location:Delayed perfusion throughout the left MCA territory with apparent sparing of the basal ganglia. There is occlusion of the left internal carotid artery on CTA, age indeterminate. Left middle cerebral artery is diseased and may have delayed perfusion due to collateral circulation and stenosis. IMPRESSION: 155 mL of delayed perfusion in the left MCA territory. No core infarction. Delayed perfusion left MCA territory may be due to severe intracranial atherosclerotic disease as well as occlusion of the left internal carotid artery. Age of the internal carotid artery occlusion is indeterminate. It is possible this delayed perfusion is a chronic or possibly acute finding. Electronically Signed   By: Marlan Palau M.D.   On: 08/27/2018 16:12   Dg Chest Port 1 View  Result Date: 08/29/2018 CLINICAL DATA:  PT presented to [facility] ED on 08/27/18 with complaints of right lower extremity weakness, dyspnea and chest pressure. Hx of stroke, Peripheral vascualar disease, cardiomyopathy, and A-fib. EXAM: PORTABLE CHEST 1 VIEW COMPARISON:  08/27/2018 FINDINGS: Mild enlargement of the cardiac silhouette, stable. No mediastinal or hilar masses. There is central vascular congestion. Mild hazy opacity noted in the perihilar regions and medial lung bases bilaterally. No convincing pleural effusion and no pneumothorax. Endotracheal tube tip projects 3.8 cm above the Carina. Nasogastric tube passes below the  diaphragm well into the stomach. IMPRESSION: 1. Findings are consistent with mild congestive heart failure with evidence of slight improvement when compared to 08/27/2018. No new  abnormalities. 2. Support apparatus is well positioned. Electronically Signed   By: Amie Portland M.D.   On: 08/29/2018 07:26   Dg Chest Port 1 View  Result Date: 08/28/2018 CLINICAL DATA:  Shortness of Breath EXAM: PORTABLE CHEST 1 VIEW COMPARISON:  08/27/2018 FINDINGS: Cardiac shadow is enlarged in size. Aortic calcifications are noted. Endotracheal tube is noted at the level of the carina. This should be withdrawn 2-3 cm. Nasogastric catheter is noted within the stomach. Lungs are well aerated bilaterally with mild interstitial edema similar to that seen on the prior exam. IMPRESSION: Interval intubation with the endotracheal tube at the level of the carina. This should be withdrawn 2-3 cm. Stable edema bilaterally. These results will be called to the ordering clinician or representative by the Radiologist Assistant, and communication documented in the PACS or zVision Dashboard. Electronically Signed   By: Alcide Clever M.D.   On: 08/28/2018 00:19   Dg Abd Portable 1v  Result Date: 08/28/2018 CLINICAL DATA:  Orogastric tube placement. EXAM: PORTABLE ABDOMEN - 1 VIEW COMPARISON:  None. FINDINGS: The patient's enteric tube is seen ending overlying the body of the stomach, with the side port about the fundus of the stomach. The visualized bowel gas pattern is grossly unremarkable. A small left pleural effusion is noted. Increased interstitial markings raise concern for pulmonary edema. Vascular congestion is noted. The patient's endotracheal tube is seen ending 2-3 cm above the carina. No acute osseous abnormalities are identified. IMPRESSION: 1. Enteric tube seen ending overlying the body of the stomach, with the side port about the fundus of the stomach. 2. Small left pleural effusion noted. Increased interstitial markings raise concern for pulmonary edema. Electronically Signed   By: Roanna Raider M.D.   On: 08/28/2018 22:08   Ct Head Code Stroke Wo Contrast`  Result Date: 08/27/2018 CLINICAL DATA:   Code stroke. Slurred speech beginning 3 weeks ago. Right-sided weakness beginning today. EXAM: CT HEAD WITHOUT CONTRAST TECHNIQUE: Contiguous axial images were obtained from the base of the skull through the vertex without intravenous contrast. COMPARISON:  CT 03/07/2013 FINDINGS: Brain: The brainstem and cerebellum appear normal by CT. Cerebral hemispheres show age related volume loss. There are old cortical and subcortical infarctions in the right frontal lobe, left parietal vertex, left frontal lobe, and left frontoparietal vertex. The show atrophy and encephalomalacia with gliosis. There is no finding of acute or subacute infarction on this CT. There is old lacunar infarction in the left caudate. Vascular: There is atherosclerotic calcification of the major vessels at the base of the brain. No evidence of acute hyperdense vessel. Skull: Negative Sinuses/Orbits: Clear/normal Other: None ASPECTS ([place] Stroke Program Early CT Score) Aspects is difficult in this patient with old infarctions. I do not suspect an acute insult. IMPRESSION: 1. No acute finding by CT. 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 2. 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 No acute finding is suspected by CT. 3. These results were called by telephone at the time of interpretation on 08/27/2018 at 11:47 am to Dr. Daryel November , who verbally acknowledged these results. Electronically Signed   By: Paulina Fusi M.D.   On: 08/27/2018 11:49    Labs:  CBC: Recent Labs    08/27/18 1122 08/28/18 0600 08/28/18 1053 08/29/18  0416  WBC 10.9* 16.1* 16.6* 14.2*  HGB 13.0 9.2* 9.4* 8.7*  HCT 39.6 28.6* 29.6* 26.9*  PLT 226 229 240 165    COAGS: Recent Labs    08/27/18 2100 08/27/18 2200  INR  --  1.50  APTT 83*  --     BMP: Recent Labs    03/03/18 0943 03/03/18 0955 08/27/18 1122 08/28/18 0600  08/29/18 0416  NA  --   --  136 138 136  K 3.8  --  3.8 4.0 4.1  CL  --   --  99 104 103  CO2  --   --  25 25 21*  GLUCOSE  --   --  190* 140* 232*  BUN  --  16 21 12 11   CALCIUM  --   --  9.4 7.6* 7.9*  CREATININE  --  0.85 1.02 0.98 1.23  GFRNONAA  --  >60 >60 >60 56*  GFRAA  --  >60 >60 >60 >60    LIVER FUNCTION TESTS: No results for input(s): BILITOT, AST, ALT, ALKPHOS, PROT, ALBUMIN in the last 8760 hours.  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 He currently remains intubated and sedated in ICU on exam today.  MR brain w/o contrast yesterday showed scattered multifocal acute ischemic left MCA territory infarcts, likely embolic in nature; possible faint scattered associated petechial hemorrhage without frank hemorrhagic transformation or significant mass effect.   No further intervention planned at this time with NIR, we will continue to follow along for now. Agree and appreciate management by neurology/CCM/cardiology.  Please call with questions or concerns.  Electronically Signed: Villa Herb, PA-C 08/29/2018, 9:10 AM   I spent a total of 15 Minutes at the the patient's bedside AND on the patient's hospital floor or unit, greater than 50% of which was counseling/coordinating care for attempted revascularization left ICA.

## 2018-08-29 NOTE — Progress Notes (Signed)
Patient now with RVR secondary to a-fib with HR fluctuating between 130-150. Amiodarone was initially considered for rate control, but per discussion with Cardiology, given about 2-3% chance of cardioversion back into sinus rhythm with this medication which would carry with it the risk of distal embolization if there is a left atrial appendage clot, digitalis +/- metoprolol would be the best choice for rate control. Cardiology has been officially consulted. Appreciate their assistance.   Electronically signed: Dr. Caryl Pina

## 2018-08-29 NOTE — Consult Note (Signed)
CARDIOLOGY CONSULT NOTE   Referring Physician: Dr. Cheral Marker Primary Physician: Dr. Caryl Comes  Primary Cardiologist: Dr. Saralyn Pilar Reason for Consultation: Afib with RVR  HPI:  All the information regarding the patient is from chart review as patient is currently sedated and intubated.   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 Patient apparently noted that the last dose of pradaxa was the evening prior hence no tPA was administered. Patient was then determine to be a candidate for endovascular intervention via IR procedure. Patient was intubated for the neuro IR intervention. It appears that an attempt at revasculraization was attempted but unsuccessful? They will follow waiting on the MRI brain to see if a repeat angiogram is possible.   It is unclear when the patient actually went into afib, but an EKG was obtained earlier tonight demonstrating afib with rvr. Patient had changes in his pressor therapy as he was initially on phenylephrine at which time the echo was done showing an EF of 60-65%, but given the concern for HF he was transitioned to levophed and required the max dose. Patient's HR's were sustaining in the 140's - 150's with borderline BP in the 110's, hence cardiology was consulted for further recommendations.  Review of Systems: unable to provide as patient intubated and sedated    Cardiac Review of Systems: {Y] = yes [ ]  = no  Chest Pain [    ]  Resting SOB [   ] Exertional SOB  [  ]  Orthopnea [  ]   Pedal Edema [   ]    Palpitations [  ] Syncope  [  ]   Presyncope [   ]  General Review of Systems: [Y] = yes [  ]=no Constitional: recent weight change [  ]; anorexia [  ]; fatigue [  ]; nausea [  ]; night  sweats [  ]; fever [  ]; or chills [  ];                                                                     Eyes : blurred vision [  ]; diplopia [   ]; vision changes [  ];  Amaurosis fugax[  ]; Resp: cough [  ];  wheezing[  ];  hemoptysis[  ];  PND [  ];  GI:  gallstones[  ], vomiting[  ];  dysphagia[  ]; melena[  ];  hematochezia [  ]; heartburn[  ];   GU: kidney stones [  ]; hematuria[  ];   dysuria [  ];  nocturia[  ]; incontinence [  ];             Skin: rash, swelling[  ];, hair loss[  ];  peripheral edema[  ];  or itching[  ]; Musculosketetal: myalgias[  ];  joint swelling[  ];  joint erythema[  ];  joint pain[  ];  back pain[  ];  Heme/Lymph: bruising[  ];  bleeding[  ];  anemia[  ];  Neuro: TIA[  ];  headaches[  ];  stroke[  ];  vertigo[  ];  seizures[  ];   paresthesias[  ];  difficulty walking[  ];  Psych:depression[  ]; anxiety[  ];  Endocrine: diabetes[  ];  thyroid dysfunction[  ];  Other:  Past Medical History:  Diagnosis Date  . Cardiomyopathy ([place])   . Diabetes mellitus without complication ([place])   . Dysrhythmia    atrial fibrillation  . GERD (gastroesophageal reflux disease)   . H/O: urethral stricture    self caths occasionally  . History of abdominal aortic aneurysm (AAA)    30/48m in diameter, stable  . History of kidney stones 2017  . Hyperlipidemia   . Hypertension   . Hypothyroidism   . Morbid obesity with BMI of 40.0-44.9, adult ([place] 2019  . Peripheral vascular disease ([place]   lymphadema both extremities, ulceration on left big toe  . [place] [place]   x 2 within 6 months. some numbness over upper arms, balance off  . Tremors of nervous system   . Vitamin D deficiency    Medications Prior to Admission  Medication Sig Dispense Refill  . amLODipine (NORVASC) 10 MG tablet Take 10 mg by mouth daily.     . bisoprolol (ZEBETA) 10 MG tablet Take 10 mg by mouth daily.     . dabigatran (PRADAXA) 150 MG CAPS capsule Take 150 mg by mouth 2 (two) times  daily.     .Marland Kitchendoxazosin (CARDURA) 2 MG tablet Take 2 mg by mouth daily.     . hydrochlorothiazide (HYDRODIURIL) 25 MG tablet Take 25 mg by mouth daily.     .Marland Kitchenlevothyroxine (SYNTHROID, LEVOTHROID) 88 MCG tablet Take 88 mcg by mouth daily before breakfast.     . losartan (COZAAR) 100 MG tablet Take 100 mg by mouth daily.     . meloxicam (MOBIC) 15 MG tablet Take 15 mg by mouth daily.     . metFORMIN (GLUCOPHAGE) 1000 MG tablet Take 1,000 mg by mouth 2 (two) times daily with a meal.     . ranitidine (ZANTAC) 150 MG tablet Take 150 mg by mouth 2 (two) times daily.    .Marland Kitchenaspirin 81 MG chewable tablet Chew 81 mg by mouth daily.     . Calcium Citrate (CITRACAL PO) Take 1 tablet by mouth 2 (two) times daily.    . Cholecalciferol (VITAMIN D3) 1000 units CAPS Take 1,000 Units by mouth daily.     . famotidine (PEPCID) 20 MG tablet Take 20 mg by mouth 2 (two) times daily.    . fluticasone (FLONASE) 50 MCG/ACT nasal spray Place 2 sprays into both nostrils daily as needed for allergies or rhinitis.     .Marland Kitchengabapentin (NEURONTIN) 100 MG capsule Take 200 mg by mouth 2 (two) times daily.     . Multiple Vitamin (MULTIVITAMIN) capsule Take 1 capsule by mouth daily.     . Omega-3 Fatty Acids (FISH OIL) 1200 MG CAPS Take 1,200 mg by mouth 3 (three) times daily.     . vitamin C (ASCORBIC ACID) 500 MG tablet Take 500 mg by mouth daily.     . chlorhexidine gluconate (MEDLINE KIT)  15 mL Mouth Rinse BID  . Chlorhexidine Gluconate Cloth  6 each Topical Q0600  . cholecalciferol  1,000 Units Per Tube Daily  . famotidine  20 mg Per Tube BID  . feeding supplement (PRO-STAT  SUGAR FREE 64)  60 mL Per Tube QID  . feeding supplement (VITAL HIGH PROTEIN)  1,000 mL Per Tube Q24H  . insulin aspart  0-20 Units Subcutaneous Q4H  . levothyroxine  88 mcg Per Tube Q0600  . mouth rinse  15 mL Mouth Rinse 10 times per day  . multivitamin  15 mL Per Tube Daily  . mupirocin ointment  1 application Nasal BID   Infusions: . sodium  chloride 75 mL/hr at 08/29/18 0400  . dexmedetomidine (PRECEDEX) IV infusion 0.8 mcg/kg/hr (08/29/18 0400)  . fentaNYL infusion INTRAVENOUS 225 mcg/hr (08/29/18 0400)  . norepinephrine (LEVOPHED) Adult infusion 40 mcg/min (08/29/18 0400)  . phenylephrine (NEO-SYNEPHRINE) Adult infusion 320 mcg/min (08/29/18 0525)   Allergies  Allergen Reactions  . Ace Inhibitors Other (See Comments)    unknown  . Omeprazole Diarrhea  . Statins Other (See Comments)    Abnormal lab results(pt unsure of what labs)   Social History   Socioeconomic History  . Marital status: Married    Spouse name: Not on file  . Number of children: Not on file  . Years of education: Not on file  . Highest education level: Not on file  Occupational History  . Not on file  Social Needs  . Financial resource strain: Not on file  . Food insecurity:    Worry: Not on file    Inability: Not on file  . Transportation needs:    Medical: Not on file    Non-medical: Not on file  Tobacco Use  . Smoking status: Former Smoker    Packs/day: 4.00    Years: 30.00    Pack years: 120.00    Types: Cigarettes    Last attempt to quit: 1984    Years since quitting: 36.0  . Smokeless tobacco: Never Used  Substance and Sexual Activity  . Alcohol use: No    Comment: occasional beer. couple years ago  . Drug use: No  . Sexual activity: Not on file  Lifestyle  . Physical activity:    Days per week: Not on file    Minutes per session: Not on file  . Stress: Not on file  Relationships  . Social connections:    Talks on phone: Not on file    Gets together: Not on file    Attends religious service: Not on file    Active member of club or organization: Not on file    Attends meetings of clubs or organizations: Not on file    Relationship status: Not on file  . Intimate partner violence:    Fear of current or ex partner: Not on file    Emotionally abused: Not on file    Physically abused: Not on file    Forced sexual  activity: Not on file  Other Topics Concern  . Not on file  Social History Narrative  . Not on file   Family History  Problem Relation Age of Onset  . Diabetes Father    PHYSICAL EXAM: Vitals:   08/29/18 0145 08/29/18 0351  BP: 115/65 112/66  Pulse: (!) 103 (!) 130  Resp: 17 (!) 22  Temp:    SpO2: 98% 90%    Intake/Output Summary (Last 24 hours) at 08/29/2018 0543 Last data filed at 08/29/2018 0400 Gross per 24 hour  Intake 6670.89 ml  Output 1350 ml  Net 5320.89 ml   General:  Intubated and sedated HEENT: normal Neck: supple. Difficult to assess JVD Cor: PMI nondisplaced. Irregularly, irregular, tachycardic. No  rubs, gallops or murmurs. Lungs: b/l ronchi disperesed Abdomen: soft, nontender, nondistended. No hepatosplenomegaly. No bruits or masses. Good bowel sounds. Extremities: no cyanosis, clubbing, rash, edema Neuro: sedated  ECG:  Results for orders placed or performed during the hospital encounter of 08/27/18 (from the past 24 hour(s))  Hemoglobin A1c     Status: Abnormal   Collection Time: 08/28/18  6:00 AM  Result Value Ref Range   Hgb A1c MFr Bld 7.1 (H) 4.8 - 5.6 %   Mean Plasma Glucose 157 mg/dL  Lipid panel     Status: Abnormal   Collection Time: 08/28/18  6:00 AM  Result Value Ref Range   Cholesterol 138 0 - 200 mg/dL   Triglycerides 164 (H) <150 mg/dL   HDL 24 (L) >40 mg/dL   Total CHOL/HDL Ratio 5.8 RATIO   VLDL 33 0 - 40 mg/dL   LDL Cholesterol 81 0 - 99 mg/dL  Basic metabolic panel     Status: Abnormal   Collection Time: 08/28/18  6:00 AM  Result Value Ref Range   Sodium 138 135 - 145 mmol/L   Potassium 4.0 3.5 - 5.1 mmol/L   Chloride 104 98 - 111 mmol/L   CO2 25 22 - 32 mmol/L   Glucose, Bld 140 (H) 70 - 99 mg/dL   BUN 12 8 - 23 mg/dL   Creatinine, Ser 0.98 0.61 - 1.24 mg/dL   Calcium 7.6 (L) 8.9 - 10.3 mg/dL   GFR calc non Af Amer >60 >60 mL/min   GFR calc Af Amer >60 >60 mL/min   Anion gap 9 5 - 15  CBC with Differential/Platelet      Status: Abnormal   Collection Time: 08/28/18  6:00 AM  Result Value Ref Range   WBC 16.1 (H) 4.0 - 10.5 K/uL   RBC 2.89 (L) 4.22 - 5.81 MIL/uL   Hemoglobin 9.2 (L) 13.0 - 17.0 g/dL   HCT 28.6 (L) 39.0 - 52.0 %   MCV 99.0 80.0 - 100.0 fL   MCH 31.8 26.0 - 34.0 pg   MCHC 32.2 30.0 - 36.0 g/dL   RDW 13.7 11.5 - 15.5 %   Platelets 229 150 - 400 K/uL   nRBC 0.0 0.0 - 0.2 %   Neutrophils Relative % 77 %   Neutro Abs 12.4 (H) 1.7 - 7.7 K/uL   Lymphocytes Relative 12 %   Lymphs Abs 2.0 0.7 - 4.0 K/uL   Monocytes Relative 9 %   Monocytes Absolute 1.4 (H) 0.1 - 1.0 K/uL   Eosinophils Relative 1 %   Eosinophils Absolute 0.2 0.0 - 0.5 K/uL   Basophils Relative 0 %   Basophils Absolute 0.1 0.0 - 0.1 K/uL   Immature Granulocytes 1 %   Abs Immature Granulocytes 0.11 (H) 0.00 - 0.07 K/uL  Glucose, capillary     Status: Abnormal   Collection Time: 08/28/18  8:28 AM  Result Value Ref Range   Glucose-Capillary 154 (H) 70 - 99 mg/dL  Triglycerides     Status: Abnormal   Collection Time: 08/28/18  9:21 AM  Result Value Ref Range   Triglycerides 171 (H) <150 mg/dL  Magnesium     Status: Abnormal   Collection Time: 08/28/18  9:21 AM  Result Value Ref Range   Magnesium 1.5 (L) 1.7 - 2.4 mg/dL  Phosphorus     Status: None   Collection Time: 08/28/18  9:21 AM  Result Value Ref Range   Phosphorus 3.4 2.5 - 4.6 mg/dL  CBC  Status: Abnormal   Collection Time: 08/28/18 10:53 AM  Result Value Ref Range   WBC 16.6 (H) 4.0 - 10.5 K/uL   RBC 2.98 (L) 4.22 - 5.81 MIL/uL   Hemoglobin 9.4 (L) 13.0 - 17.0 g/dL   HCT 29.6 (L) 39.0 - 52.0 %   MCV 99.3 80.0 - 100.0 fL   MCH 31.5 26.0 - 34.0 pg   MCHC 31.8 30.0 - 36.0 g/dL   RDW 13.9 11.5 - 15.5 %   Platelets 240 150 - 400 K/uL   nRBC 0.0 0.0 - 0.2 %  Glucose, capillary     Status: Abnormal   Collection Time: 08/28/18 12:11 PM  Result Value Ref Range   Glucose-Capillary 160 (H) 70 - 99 mg/dL  Culture, respiratory (non-expectorated)     Status:  None (Preliminary result)   Collection Time: 08/28/18  5:23 PM  Result Value Ref Range   Specimen Description TRACHEAL ASPIRATE    Special Requests NONE    Gram Stain      MODERATE WBC PRESENT, PREDOMINANTLY PMN NO ORGANISMS SEEN Performed at [facility], [address], [place], [place] [address]    Culture PENDING    Report Status PENDING   Magnesium     Status: Abnormal   Collection Time: 08/28/18  6:26 PM  Result Value Ref Range   Magnesium 1.5 (L) 1.7 - 2.4 mg/dL  Phosphorus     Status: None   Collection Time: 08/28/18  6:26 PM  Result Value Ref Range   Phosphorus 3.3 2.5 - 4.6 mg/dL  CBC     Status: Abnormal   Collection Time: 08/29/18  4:16 AM  Result Value Ref Range   WBC 14.2 (H) 4.0 - 10.5 K/uL   RBC 2.66 (L) 4.22 - 5.81 MIL/uL   Hemoglobin 8.7 (L) 13.0 - 17.0 g/dL   HCT 26.9 (L) 39.0 - 52.0 %   MCV 101.1 (H) 80.0 - 100.0 fL   MCH 32.7 26.0 - 34.0 pg   MCHC 32.3 30.0 - 36.0 g/dL   RDW 13.9 11.5 - 15.5 %   Platelets 165 150 - 400 K/uL   nRBC 0.0 0.0 - 0.2 %   Ct Angio Head W Or Wo Contrast  Result Date: 08/27/2018 CLINICAL DATA:  Acute presentation with speech disturbance. EXAM: CT ANGIOGRAPHY HEAD AND NECK TECHNIQUE: Multidetector CT imaging of the head and neck was performed using the standard protocol during bolus administration of intravenous contrast. Multiplanar CT image reconstructions and MIPs were obtained to evaluate the vascular anatomy. Carotid stenosis measurements (when applicable) are obtained utilizing NASCET criteria, using the distal internal carotid diameter as the denominator. CONTRAST:  35m ISOVUE-370 IOPAMIDOL (ISOVUE-370) INJECTION 76% COMPARISON:  CT same day. FINDINGS: CTA NECK FINDINGS Aortic arch: Aortic atherosclerosis.  No aneurysm or dissection. Right carotid system: Common carotid artery shows some atherosclerotic plaque but is widely patent to the bifurcation. There is soft and calcified plaque affecting the carotid bifurcation and  ICA bulb region. There is severe stenosis in the distal bulb region with luminal diameter of 1 mm. The vessel is quite tortuous beyond that but widely patent to the upper cervical region, where there is soft and calcified plaque resulting in minimal diameter of 3.5 mm. Compared to an expected diameter of 5 mm, stenosis in the bulb is 80% or greater. Stenosis in the upper cervical region is 30%. Left carotid system: Common carotid artery shows atherosclerotic plaque but is sufficiently patent to the bifurcation region. There is calcified  and soft plaque at the carotid bifurcation and ICA bulb. There is left internal carotid artery occlusion at the distal bulb. 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 The left vertebral artery shows 50% stenosis at its origin but is sufficiently patent beyond that through the cervical region to the foramen magnum. Skeleton: Mid cervical spondylosis. Other neck: No mass or lymphadenopathy. Upper chest: Interstitial prominence which could go along with fluid overload or mild edema. Review of the MIP images confirms the above findings CTA HEAD FINDINGS Anterior circulation: Left internal carotid artery is occluded without antegrade flow through the skull base. Right internal carotid artery shows atherosclerotic disease in the carotid siphon region with stenosis estimated at 50-70%. The anterior and middle cerebral vessels are patent. There is atherosclerotic irregularity of the M1 segment with stenosis of 50-70%. No acute vessel occlusion is identified. There is a chronic punctate calcified embolus in 1 of the insular branches, but this was present in 2014. On the left, there is reconstituted flow in the distal siphon. Severe stenosis of the supraclinoid ICA, 80% or greater. Flow is present in the anterior and middle cerebral vessels, secondary to reconstituted flow and flow through communicating arteries. There is 50% stenosis in the M1 segment. I do not  see any occluded or missing large or medium vessels in the MCA territory. Posterior circulation: Right vertebral artery shows no antegrade flow at the foramen magnum. There is retrograde flow in the distal right vertebral. Left vertebral artery is patent at the foramen magnum. There is atherosclerotic disease of the V4 segment with stenosis estimated at 70%. There are serial stenoses. The vessel does show flow to the basilar, which shows atherosclerotic irregularity but is patent. Flow is present in the superior cerebellar and posterior cerebral arteries. Venous sinuses: Patent and normal. Anatomic variants: None significant. Delayed phase: No abnormal enhancement. 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 Severe irregular atherosclerotic disease of the right carotid bifurcation with stenosis of 80% in the bulb region and 30% in the distal ICA. 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 50% stenosis of the left vertebral artery origin. Severe atherosclerotic disease in both carotid siphon regions. Right siphon stenosis estimated at 50-70%. Reconstituted flow in the distal left siphon and supraclinoid region. 80% supraclinoid stenosis. Flow in both anterior and middle cerebral artery territories. Stenosis of both M1 segments estimated at 50-70%. I do not identify any large or medium vessel occlusion acutely within the MCA branch vessels. Severely disease left V4 segment with serial stenoses proximal to the basilar estimated at 70%. Basilar atherosclerotic irregularity without flow limiting stenosis. Posterior circulation branch vessels do show flow, including right PICA which is supplied by retrograde flow in the distal right vertebral artery. Electronically Signed   By: Nelson Chimes M.D.   On: 08/27/2018 13:14   Dg Chest 2 View  Result Date: 08/27/2018 CLINICAL DATA:  Chest pain and shortness of breath.  Cardiomyopathy.  EXAM: CHEST - 2 VIEW COMPARISON:  03/07/2013 FINDINGS: Mild cardiomegaly. Aortic atherosclerosis. Diffuse interstitial infiltrates, consistent with pulmonary edema. Mild subsegmental atelectasis also seen in the left upper lobe. No evidence of pulmonary consolidation or significant pleural effusion. IMPRESSION: Mild cardiomegaly and diffuse interstitial edema, consistent with congestive heart failure. Electronically Signed   By: Earle Gell M.D.   On: 08/27/2018 12:02   Ct Angio Neck W Or Wo Contrast  Result Date: 08/27/2018 CLINICAL DATA:  Acute presentation with speech disturbance. EXAM: CT ANGIOGRAPHY HEAD AND NECK TECHNIQUE: Multidetector CT imaging of the head and neck was performed using the standard protocol during bolus administration of intravenous contrast. Multiplanar CT image reconstructions and MIPs were obtained to evaluate the vascular anatomy. Carotid stenosis measurements (when applicable) are obtained utilizing NASCET criteria, using the distal internal carotid diameter as the denominator. CONTRAST:  62m ISOVUE-370 IOPAMIDOL (ISOVUE-370) INJECTION 76% COMPARISON:  CT same day. FINDINGS: CTA NECK FINDINGS Aortic arch: Aortic atherosclerosis.  No aneurysm or dissection. Right carotid system: Common carotid artery shows some atherosclerotic plaque but is widely patent to the bifurcation. There is soft and calcified plaque affecting the carotid bifurcation and ICA bulb region. There is severe stenosis in the distal bulb region with luminal diameter of 1 mm. The vessel is quite tortuous beyond that but widely patent to the upper cervical region, where there is soft and calcified plaque resulting in minimal diameter of 3.5 mm. Compared to an expected diameter of 5 mm, stenosis in the bulb is 80% or greater. Stenosis in the upper cervical region is 30%. Left carotid system: Common carotid artery shows atherosclerotic plaque but is sufficiently patent to the bifurcation region. There is calcified and  soft plaque at the carotid bifurcation and ICA bulb. There is left internal carotid artery occlusion at the distal bulb. 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 The left vertebral artery shows 50% stenosis at its origin but is sufficiently patent beyond that through the cervical region to the foramen magnum. Skeleton: Mid cervical spondylosis. Other neck: No mass or lymphadenopathy. Upper chest: Interstitial prominence which could go along with fluid overload or mild edema. Review of the MIP images confirms the above findings CTA HEAD FINDINGS Anterior circulation: Left internal carotid artery is occluded without antegrade flow through the skull base. Right internal carotid artery shows atherosclerotic disease in the carotid siphon region with stenosis estimated at 50-70%. The anterior and middle cerebral vessels are patent. There is atherosclerotic irregularity of the M1 segment with stenosis of 50-70%. No acute vessel occlusion is identified. There is a chronic punctate calcified embolus in 1 of the insular branches, but this was present in 2014. On the left, there is reconstituted flow in the distal siphon. Severe stenosis of the supraclinoid ICA, 80% or greater. Flow is present in the anterior and middle cerebral vessels, secondary to reconstituted flow and flow through communicating arteries. There is 50% stenosis in the M1 segment. I do not see any occluded or missing large or medium vessels in the MCA territory. Posterior circulation: Right vertebral artery shows no antegrade flow at the foramen magnum. There is retrograde flow in the distal right vertebral. Left vertebral artery is patent at the foramen magnum. There is atherosclerotic disease of the V4 segment with stenosis estimated at 70%. There are serial stenoses. The vessel does show flow to the basilar, which shows atherosclerotic irregularity but is patent. Flow is present in the superior cerebellar and posterior  cerebral arteries. Venous sinuses: Patent and normal. Anatomic variants: None significant. Delayed phase: No abnormal enhancement. 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 Severe irregular atherosclerotic disease of the right carotid bifurcation with stenosis of 80% in the bulb region and 30% in the distal ICA. 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 50% stenosis of the left vertebral artery origin. Severe atherosclerotic disease in both carotid siphon regions. Right siphon stenosis  estimated at 50-70%. Reconstituted flow in the distal left siphon and supraclinoid region. 80% supraclinoid stenosis. Flow in both anterior and middle cerebral artery territories. Stenosis of both M1 segments estimated at 50-70%. I do not identify any large or medium vessel occlusion acutely within the MCA branch vessels. Severely disease left V4 segment with serial stenoses proximal to the basilar estimated at 70%. Basilar atherosclerotic irregularity without flow limiting stenosis. Posterior circulation branch vessels do show flow, including right PICA which is supplied by retrograde flow in the distal right vertebral artery. Electronically Signed   By: Nelson Chimes M.D.   On: 08/27/2018 13:14   Mr Brain Wo Contrast  Result Date: 08/28/2018 CLINICAL DATA:  Follow-up examination for acute stroke, right lower extremity weakness. Known left ICA occlusion. EXAM: MRI HEAD WITHOUT CONTRAST TECHNIQUE: Multiplanar, multiecho pulse sequences of the brain and surrounding structures were obtained without intravenous contrast. COMPARISON:  Prior CTs from 08/27/2018. FINDINGS: Brain: Generalized age-related cerebral atrophy. Patchy and confluent T2/FLAIR hyperintensity within the periventricular and deep white matter both cerebral hemispheres most consistent with chronic microvascular ischemic disease, moderate nature. Scatter remote cortical infarcts  involving the posterior left frontoparietal region, right parietal lobe, and right frontal lobe are seen. Small remote right cerebellar infarct. Remote lacunar infarct present at the left caudate head. Scattered chronic hemosiderin staining seen about several of these infarcts. Scattered multifocal foci of restricted diffusion involving the cortical subcortical left frontal, parietal, and temporal occipital regions, compatible with acute ischemic left MCA territory infarcts. Largest area of infarction seen at the high left frontal parietal region and measures 2.5 cm (series 3, image 48). Possible faint petechial hemorrhage about a few of these infarcts without frank hemorrhagic transformation or significant mass effect. Findings likely embolic in nature. No mass lesion, midline shift or mass effect. Mild diffuse ventricular prominence related global parenchymal volume loss without hydrocephalus. No extra-axial fluid collection. Pituitary gland within normal limits. Vascular: Abnormal flow void within the left ICA to its cavernous segment, compatible with previously identified left ICA occlusion. Major intracranial vascular flow voids otherwise maintained at the skull base. Skull and upper cervical spine: Craniocervical junction within normal limits. Upper cervical spine normal. Bone marrow signal intensity within normal limits. No scalp soft tissue abnormality. Sinuses/Orbits: Patient status post bilateral ocular lens replacement. Globes and orbital soft tissues demonstrate no acute finding. Scattered mucosal thickening seen throughout the paranasal sinuses with superimposed scattered air-fluid levels. Fluid seen within the nasopharynx. Patient likely intubated. Trace bilateral mastoid effusions noted. Inner ear structures normal. Other: None. IMPRESSION: 1. 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 Possible faint scattered associated petechial hemorrhage  without frank hemorrhagic transformation or significant mass effect. 2. Abnormal flow void within the left ICA to its cavernous segment, consistent with known left ICA occlusion. 3. 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 4. Moderately advanced cerebral atrophy with chronic small vessel ischemic disease. Electronically Signed   By: Jeannine Boga M.D.   On: 08/28/2018 15:33   Ct Cerebral Perfusion W Contrast  Result Date: 08/27/2018 CLINICAL DATA:  Right-sided weakness onset this morning. EXAM: CT PERFUSION BRAIN TECHNIQUE: Multiphase CT imaging of the brain was performed following IV bolus contrast injection. Subsequent parametric perfusion maps were calculated using RAPID software. CONTRAST:  65m ISOVUE-370 IOPAMIDOL (ISOVUE-370) INJECTION 76% COMPARISON:  CT a head today FINDINGS: CT Brain Perfusion Findings: CBF (<30%) Volume: 036mPerfusion (Tmax>6.0s) volume: 15558mismatch Volume: 155m81m  ASPECTS on noncontrast CT Head today, not calculated due to extensive chronic ischemic change. No definite acute infarct on CT. Infarct Core: 0 mL Infarction Location:Delayed perfusion throughout the left MCA territory with apparent sparing of the basal ganglia. There is occlusion of the left internal carotid artery on CTA, age indeterminate. Left middle cerebral artery is diseased and may have delayed perfusion due to collateral circulation and stenosis. IMPRESSION: 155 mL of delayed perfusion in the left MCA territory. No core infarction. Delayed perfusion left MCA territory may be due to severe intracranial atherosclerotic disease as well as occlusion of the left internal carotid artery. Age of the internal carotid artery occlusion is indeterminate. It is possible this delayed perfusion is a chronic or possibly acute finding. Electronically Signed   By: Franchot Gallo M.D.   On: 08/27/2018 16:12   Dg Chest Port 1 View  Result  Date: 08/28/2018 CLINICAL DATA:  Shortness of Breath EXAM: PORTABLE CHEST 1 VIEW COMPARISON:  08/27/2018 FINDINGS: Cardiac shadow is enlarged in size. Aortic calcifications are noted. Endotracheal tube is noted at the level of the carina. This should be withdrawn 2-3 cm. Nasogastric catheter is noted within the stomach. Lungs are well aerated bilaterally with mild interstitial edema similar to that seen on the prior exam. IMPRESSION: Interval intubation with the endotracheal tube at the level of the carina. This should be withdrawn 2-3 cm. Stable edema bilaterally. These results will be called to the ordering clinician or representative by the Radiologist Assistant, and communication documented in the PACS or zVision Dashboard. Electronically Signed   By: Inez Catalina M.D.   On: 08/28/2018 00:19   Dg Abd Portable 1v  Result Date: 08/28/2018 CLINICAL DATA:  Orogastric tube placement. EXAM: PORTABLE ABDOMEN - 1 VIEW COMPARISON:  None. FINDINGS: The patient's enteric tube is seen ending overlying the body of the stomach, with the side port about the fundus of the stomach. The visualized bowel gas pattern is grossly unremarkable. A small left pleural effusion is noted. Increased interstitial markings raise concern for pulmonary edema. Vascular congestion is noted. The patient's endotracheal tube is seen ending 2-3 cm above the carina. No acute osseous abnormalities are identified. IMPRESSION: 1. Enteric tube seen ending overlying the body of the stomach, with the side port about the fundus of the stomach. 2. Small left pleural effusion noted. Increased interstitial markings raise concern for pulmonary edema. Electronically Signed   By: Garald Balding M.D.   On: 08/28/2018 22:08   Ct Head Code Stroke Wo Contrast`  Result Date: 08/27/2018 CLINICAL DATA:  Code stroke. Slurred speech beginning 3 weeks ago. Right-sided weakness beginning today. EXAM: CT HEAD WITHOUT CONTRAST TECHNIQUE: Contiguous axial images were  obtained from the base of the skull through the vertex without intravenous contrast. COMPARISON:  CT 03/07/2013 FINDINGS: Brain: The brainstem and cerebellum appear normal by CT. Cerebral hemispheres show age related volume loss. There are old cortical and subcortical infarctions in the right frontal lobe, left parietal vertex, left frontal lobe, and left frontoparietal vertex. The show atrophy and encephalomalacia with gliosis. There is no finding of acute or subacute infarction on this CT. There is old lacunar infarction in the left caudate. Vascular: There is atherosclerotic calcification of the major vessels at the base of the brain. No evidence of acute hyperdense vessel. Skull: Negative Sinuses/Orbits: Clear/normal Other: None ASPECTS ([place] Stroke Program Early CT Score) Aspects is difficult in this patient with old infarctions. I do not suspect an acute insult. IMPRESSION: 1. No acute finding  by CT. 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 2. 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 No acute finding is suspected by CT. 3. These results were called by telephone at the time of interpretation on 08/27/2018 at 11:47 am to Dr. Lenise Arena , who verbally acknowledged these results. Electronically Signed   By: Nelson Chimes M.D.   On: 08/27/2018 11:49   ASSESSMENT:  L MCA infarct, L ICA occlusion Afib with RVR with hx of paroxysmal atrial fibrillation, presumably on pradaxa and complaint. Elevated troponin in the setting of a L MCA infarct/Afib with rvr Anemia Hb drop from 13 -> 8.7? Hx of NICM, HFrEF (previously with prior echo having an EF of 45%?) with most recent echo showing EF of 60-65%, unclear if patient was on levophed during the time of the echo. HTN, OSA, Type 2 DM, Hx of ischemia CVA Underlying chronic RBBB  PLAN/DISCUSSION:  Given that it is unknown how compliant  patient was on pradaxa and after talking to neurology and understanding their concern that this could be a embolic event I didn't feel it was appropriate to start amio. Additionally, patient has anemia with drop in Hb and given the extensive CVA, neurology plans to hold anticoagulation, with out a definitive time life. Hence, with the pressures being borderline I recommended changing the levophed back to phenylephrine given his EF was normal on most recent echo as there should be less beta agonist properties with this. I also recommended 561mg of dig IV given the normal renal function which appeared to have improved the HR's some. If he continues to remain high can continue with dig load with 2563m x 3 dose q6hrs apart.  Elevated troponin in the setting of RBBB, it's difficult to definitively r/o ischemia. Patient is unable to provide any hx and he has a significant CVA with afib with rvr which could all be contributing to his elevated troponin of 3.59. It would be helpful to know if he had any ischemic evaluation done by his primary cardiologist, if so, when and form of ischemic evaluation. However, given that patient is extremely limited with which medications can be administered (with respect to antiplatelet and anticoagulation therapy) will continue to trend troponin's. ACS is less likely but cannot be completed excluded as most recent echo had inadequate images to assess for LV wall motion.  Source of anemia? Bleeding? Retroperitoneal? The low Hb can also contribute to the afib.  We will continue to follow. Please call back with any further questions or concerns.  HaWilleen Cassfellow)

## 2018-08-29 NOTE — Progress Notes (Signed)
OT Cancellation Note  Patient Details Name: Razeen Laudadio MRN: [id number] DOB: 06-05-1941   Cancelled Treatment:    Reason Eval/Treat Not Completed: Patient not medically ready.  Pt remains intubated and MD cancelled order.  Will sign off.   Chancy Milroy, OT Acute Rehabilitation Services Pager [phone] Office [phone]   Chancy Milroy 08/29/2018, 1:00 PM

## 2018-08-29 NOTE — Progress Notes (Signed)
Called to evaluate right lower arm for possible contrast extravasation. It is not clear if a contrast extravasation has occurred. The IV site flushes appropriately and there is no definitive hardening.   Patient is sedated and ventilated - he is unable to answer any questions. He does not move his right arm or hand to stimuli.  There is no definitive firmness just above the IV site in the right dorsal aspect of the hand. Patient is significantly edematous in both upper and lower extremities.There is no erythema or skin changes to the right hand or forearm  Unable to assess ROM in fingers, wrist, elbow. Pulses are faint bilaterally to palpation.  Recommend elevate right arm if possible for 6-12 hours to reduce swelling. Ice pack to right forearm 20-60 minutes QID PRN for pain or swelling.   Floor staff to continue to follow, they will call IR with questions or concerns.  Lynnette Caffey, PA-C 08/29/2018 1647

## 2018-08-29 NOTE — Progress Notes (Signed)
Chart reviewed - agree with fellow assessments overnight and recommendations for rate-control of afib at this time, in the setting of anemia - which will limit attempts to control rhythm or perform any invasive coronary work-up. Cardiology will follow with you over the weekend.  Chrystie Nose, MD, [facility], FACP  [place]  [facility]  Medical Director of the [facility]  Card[facility] Reduction Clinic Diplomate of the American Board of Clinical Lipidology Attending Cardiologist  Direct Dial: [phone]  Fax: [phone]  Website:  [email or web address]

## 2018-08-29 NOTE — Progress Notes (Signed)
NAME:  Darren Frazier, MRN:  [id number], DOB:  10/21/1940, LOS: 2 ADMISSION DATE:  08/27/2018, CONSULTATION DATE: 08/27/2018 REFERRING MD:  Dr. Corliss Skainseveshwar, CHIEF COMPLAINT:  Rt leg weakness   Brief History   77 yo male [facility] to [facility] with Rt leg numbness and weakness.  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  [facility].  He was intubated for neuro-IR intervention.  Past Medical History  A fib on pradaxa, HTN, Urethral stricture, cardiomyopathy, PAD, HLD, CVA, lymph edema, Vitamin D deficiency, Neuropathy, Hypothyroidism, DM  Significant Hospital Events   12/25 Admit, arteriogram by IR 12/26 A fib with RVR elevated troponin, progressive anemia, hypotension requiring pressors 12/27 Transfuse 1 unit PRBC, change to pressure control  Consults:  Neurology Neuro IR Cardiology  Procedures:  ETT 12/25 >>   Significant Diagnostic Tests:  CT angio head/neck 12/25 >> occlusion of Lt ICA Echo 12/26 >> EF 60 to 65% CT abd/pelvis 12/27 >>   Micro Data:    Antimicrobials:     Interim history/subjective:  A fib with RVR overnight.  Increasing pressors needs,  Elevated troponin.  Progressive anemia.  Objective   Blood pressure 104/67, pulse 98, temperature (!) 100.8 F (38.2 C), temperature source Axillary, resp. rate (!) 23, height 5\' 9"  (1.753 m), weight (!) 142.4 kg, SpO2 94 %.    Vent Mode: PRVC FiO2 (%):  [40 %-50 %] 50 % Set Rate:  [16 bmp] 16 bmp Vt Set:  [560 mL] 560 mL PEEP:  [5 cmH20] 5 cmH20 Plateau Pressure:  [23 cmH20-25 cmH20] 25 cmH20   Intake/Output Summary (Last 24 hours) at 08/29/2018 [id number] Last data filed at 08/29/2018 0700 Gross per 24 hour  Intake 6627.53 ml  Output 1205 ml  Net 5422.53 ml   Filed Weights   08/27/18 2013 08/29/18 0436  Weight: (!) 146.8 kg (!) 142.4 kg    Examination:  General - sedated Eyes - pupils reactive ENT - ETT in place Cardiac - regular, tachycardic, no murmur Chest - b/l crackles Abdomen - soft, mild  distention, decreased bowel sounds,  GU - foley in place Extremities - extremities cool Skin - bruise around right groin Neuro - RASS -3  CXR 12/27 (reviewed by me) >> interstitial edema  Resolved Hospital Problem list     Assessment & Plan:   Acute respiratory failure with compromised airway in setting of CVA. 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 Plan - hold ASA in setting of possible bleeding  Shock in setting of probable acute blood loss. Plan - pressors to keep SBP 140 to 160 per neurology - transfuse PRBC  A fib with RVR. Hx of HTN, HLD, elevated troponin. Plan - hold pradaxa in setting of possible bleeding - hold outpt norvasc, zebata, HCTZ, cozaar in setting of hypotension - cardiology consulted - repeat digoxin if he becomes tachycardic again  Acute blood loss anemia with concern for retroperitoneal hematoma >> baseline Hb 13 from 08/27/18. Plan - transfuse 1 unit PRBC 12/27 - f/u CT abd/pelvis - f/u CBC  DM type II with peripheral neuropathy. Hypothyroidism. Plan - SSI - continue synthroid - hold outpt metformin, neurontin   Best practice:  Diet: tube feeds DVT prophylaxis: SCDs GI prophylaxis: Protonix Mobility: Bed rest Code Status: Full code Family Communication: no family at bedside   Labs    CMP Latest Ref Rng & Units 08/29/2018 08/28/2018 08/27/2018  Glucose 70 - 99 mg/dL 829(F232(H)  140(H) 190(H)  BUN 8 - 23 mg/dL 11 12 21   Creatinine 0.61 - 1.24 mg/dL 9.16 3.84 6.65  Sodium 135 - 145 mmol/L 136 138 136  Potassium 3.5 - 5.1 mmol/L 4.1 4.0 3.8  Chloride 98 - 111 mmol/L 103 104 99  CO2 22 - 32 mmol/L 21(L) 25 25  Calcium 8.9 - 10.3 mg/dL 7.9(L) 7.6(L) 9.4  Total Protein 6.4 - 8.2 g/dL - - -  Total Bilirubin 0.2 - 1.0 mg/dL - - -  Alkaline Phos 50 - 136 Unit/L - - -  AST 15 - 37 Unit/L - - -  ALT 12 - 78 U/L - - -   CBC Latest Ref Rng & Units 08/29/2018 08/28/2018 08/28/2018  WBC  4.0 - 10.5 K/uL 14.2(H) 16.6(H) 16.1(H)  Hemoglobin 13.0 - 17.0 g/dL 9.9(J) 5.7(S) 1.7(B)  Hematocrit 39.0 - 52.0 % 26.9(L) 29.6(L) 28.6(L)  Platelets 150 - 400 K/uL 165 240 229   ABG    Component Value Date/Time   PHART 7.364 08/27/2018 2238   PCO2ART 44.7 08/27/2018 2238   PO2ART 99.0 08/27/2018 2238   HCO3 25.5 08/27/2018 2238   TCO2 27 08/27/2018 2238   O2SAT 97.0 08/27/2018 2238   CBG (last 3)  Recent Labs    08/28/18 2306 08/29/18 0318 08/29/18 0442  GLUCAP 198* 253* 229*    CC time 34 minutes  Coralyn Helling, MD [facility] 08/29/2018, 8:22 AM

## 2018-08-29 NOTE — Progress Notes (Signed)
Troponin elevated at 3.59. Discussed with Cardiology. Given EKG earlier this morning showing no ST changes, it is felt unlikely that this is due to an MI. More likely troponin leak due to the RVR earlier this shift. Cardiology advises to trend out troponins with next troponin level to guide further evaluation and management.   Electronically signed: Dr. Caryl Pina

## 2018-08-29 NOTE — Progress Notes (Signed)
Signed      Expand All Collapse All    Show:Clear all [x] Manual[x] Template[x] Copied  Added by: [x] Aralyn Nowak, MD[x] Rinehuls, Kinnie Scalesavid L, PA-C  [] Hover for details STROKE TEAM PROGRESS NOTE   HISTORY OF PRESENT ILLNESS (per record) Darren Frazier a 77 y.o.malePMH of HTN, atrial fibrillation on Pradaxa last dose last evening, urethral stricture, cardiomyopathy, peripheral vascular disease, hypertension, hyperlipidemia, stroke with some balance issues, bilateral lymphedema vitamin D deficiency came into [facility] for evaluation of right lower extremity numbness and weakness with last known normal around 8:30 AM today. He was evaluated by telemedicine neurology, given NIH stroke scale of 4 and vascular imaging was recommended. 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 No known history of ICA occlusion. I was called regarding this patient's potential transfer in IR evaluation. I recommended getting a CT perfusion study which was done that showed a large penumbra with no core in the left cerebral hemisphere. He was transferred for further work-up and diagnostic angiogram [facility]    LKW:8:30 AM on 08/27/2018 tpa given?: no,on Pradaxa last dose last evening Premorbid modified Rankin scale (mRS):552from peripheral vascular disease   Cerebral Angiogram With Attempted Intervention 08/27/2018 S/P bilateral commonn carotid and Lt VA arteriogram followed by attempted revascularization of symptomatic acute occlusion of Lt ICa prox. 2.Severe 90 % stenosis of RT ICA prox .70 % stenosis of prox basilar artery. 3.Severte stenosis of dominant LT VBJ distal to Lt PICA  4.Occluded RT VA prox.    SUBJECTIVE (INTERVAL HISTORY) Son and Wife at bedside.  He went into [facility] with a. Fib earlier this am.  Troponins elevated too which cardiology thinks is  due to leak from RVR.  He BP is being maintained on Phenylephrine, but still not as high as I would like.  He is on Precedex and Fentanyl gtt for sedation.   OBJECTIVE       Vitals:   08/27/18 2200 08/28/18 0019 08/28/18 0350 08/28/18 0400  BP: 96/63 (!) 119/56 (!) 108/49   Pulse: 62 73 86   Resp: 16 (!) 23 16   Temp:    98.6 F (37 C)  TempSrc:    Axillary  SpO2: 96% 99% 98%   Weight:      Height:        CBC:  LastLabs      Recent Labs  Lab 08/27/18 1122 08/28/18 0600  WBC 10.9* 16.1*  NEUTROABS  --  12.4*  HGB 13.0 9.2*  HCT 39.6 28.6*  MCV 97.5 99.0  PLT 226 229      Basic Metabolic Panel:  LastLabs      Recent Labs  Lab 08/27/18 1122 08/28/18 0600  NA 136 138  K 3.8 4.0  CL 99 104  CO2 25 25  GLUCOSE 190* 140*  BUN 21 12  CREATININE 1.02 0.98  CALCIUM 9.4 7.6*      Lipid Panel:  Labs(Brief)          Component Value Date/Time   CHOL 138 08/28/2018 0600   TRIG 164 (H) 08/28/2018 0600   HDL 24 (L) 08/28/2018 0600   CHOLHDL 5.8 08/28/2018 0600   VLDL 33 08/28/2018 0600   LDLCALC 81 08/28/2018 0600     HgbA1c:  RecentLabs  No results found for: HGBA1C   Urine Drug Screen:  Labs(Brief)  No results found for: LABOPIA, COCAINSCRNUR, LABBENZ, AMPHETMU, THCU, LABBARB  Alcohol Level  Labs(Brief)  No results found for: ETH      IMAGING   Ct Angio Head W Or Wo Contrast Ct Angio Neck W Or Wo Contrast 08/27/2018 IMPRESSION:   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   Severe irregular atherosclerotic disease of the right carotid bifurcation with stenosis of 80% in the bulb region and 30% in the distal ICA.   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 50% stenosis of the left vertebral artery origin.   Severe atherosclerotic disease in both carotid siphon regions.   Right siphon stenosis estimated at 50-70%.   Reconstituted flow in the distal left siphon  and supraclinoid region. 80% supraclinoid stenosis.   Flow in both anterior and middle cerebral artery territories.   Stenosis of both M1 segments estimated at 50-70%.   I do not identify any large or medium vessel occlusion acutely within the MCA branch vessels.   Severely diseased left V4 segment with serial stenoses proximal to the basilar estimated at 70%.   Basilar atherosclerotic irregularity without flow limiting stenosis.   Posterior circulation branch vessels do show flow, including right PICA which is supplied by retrograde flow in the distal right vertebral artery.     Dg Chest 2 View 08/27/2018 IMPRESSION:  Mild cardiomegaly and diffuse interstitial edema, consistent with congestive heart failure.   Ct Cerebral Perfusion W Contrast 08/27/2018 IMPRESSION:   155 mL of delayed perfusion in the left MCA territory. No core infarction.   Delayed perfusion left MCA territory may be due to severe intracranial atherosclerotic disease as well as occlusion of the left internal carotid artery.   Age of the internal carotid artery occlusion is indeterminate.   It is possible this delayed perfusion is a chronic or possibly acute finding.   Dg Chest Port 1 View  08/28/2018 IMPRESSION:  Interval intubation with the endotracheal tube at the level of the carina. This should be withdrawn 2-3 cm. Stable edema bilaterally.    Ct Head Code Stroke Wo Contrast` 08/27/2018 IMPRESSION:  1. No acute finding by CT. 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  2. 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 No acute finding is suspected by CT.     Cerebral Angiogram With Attempted Intervention 08/27/2018 S/P bilateral commonn carotid and Lt VA arteriogram followed by attempted revascularization of symptomatic acute occlusion of Lt ICa prox. 2.Severe 90 %  stenosis of RT ICA prox .70 % stenosis of prox basilar artery. 3.Severte stenosis of dominant LT VBJ distal to Lt PICA  4.Occluded RT VA prox.    Transthoracic Echocardiogram  00/00/00 Pending       PHYSICAL EXAM Blood pressure (!) 108/49, pulse 86, temperature 98.6 F (37 C), temperature source Axillary, resp. rate 16, height 5\' 9"  (1.753 m), weight (!) 146.8 kg, SpO2 98 %.   Fentanyl stopped prior to exam.  Intubated. Eyes open but no meaningful eye contact.  He does not follow any commands.   He moves the  left arm and leg spontaneously.  He does not show 2 fingers as instructed on the left.  No movement on the RUE and RLE.  No withdrawal to pain on the right.    ASSESSMENT/PLAN Darren Frazier is a 77 y.o. male with history of HTN, atrial fibrillation on Pradaxa last dose last evening, urethral stricture, cardiomyopathy, peripheral vascular disease, hypertension, hyperlipidemia, stroke with some balance issues, bilateral  lymphedema vitamin D deficiency presenting with right lower extremity numbness and weakness and suspected new Lt ICA occlusion. He did not receive IV t-PA due to late presentation.  Stroke:  Lt MCA territory infarct - MRI pending  Resultant  Aphasia and right hemiparesis.  CT head - No acute finding by CT. Multiple old infarcts.  MRI head - pending  MRA head - not performed  CTA H&N - diffuse disease as above.  CT Perfusion - 155 mL of delayed perfusion in the left MCA territory. No core infarction.   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  2D Echo - pending  LDL - 81  HgbA1c - pending  UDS - not performed  VTE prophylaxis - SCDs  Diet - NPO  aspirin 81 mg daily and Pradaxa (dabigatran) twice  a day prior to admission, now on No antithrombotic  Patient counseled to be compliant with his antithrombotic medications  Ongoing aggressive stroke risk factor management  Therapy recommendations:  pending  Disposition:  Pending  Hypertension  BP running low - Cleviprex -> phenyephrine  Permissive hypertension (OK if < 220/120) but gradually normalize in 5-7 days  Long-term BP goal normotensive  Hyperlipidemia  Lipid lowering medication PTA: none  LDL 81, goal < 70  Current lipid lowering medication: none (statin intolerant - abnormal labs)  Continue statin at discharge  Diabetes  HgbA1c pending, goal < 7.0  Unc / Controlled  Other Stroke Risk Factors  Advanced age  Former cigarette smoker - quit  Obesity, Body mass index is 47.79 kg/m., recommend weight loss, diet and exercise as appropriate   Hx stroke/TIA  Atrial fibrillation  Cardiomyopathy  Other Active Problems  Anemia - recheck in AM  Diffuse cerebrovascular disease  Mildly low BP - avoid hypotension due to areas of stenosis  Leukocytosis - afebrile - recheck in AM  Cardiomyopathy / CHF   Plan  Antiplatelet therapy  Avoid hypotension - but watch for worsening CHF - await echo - Cleviprex -> phenyephrine   Hospital day # 1  A/P:  Angiogram showed severe intracranial and extracranial disease in both ICA and basilar and left MCA. He likely had atheroembolism from the left ICA to the left MCA causing the decline last night.  MRI showed multiple acute infarcts in the left MCA distribution with largest in the left motor cortex.  There is a small degree of petechial hemorrhage within that motor cortex infarct as well.  I will hold off on starting ASA due to this.  He is in a. Fib and is at risk of another cardioembolic infarct. However, given groin bleeding and petechial hemorrhage, not a good candidate for anticoagulation either.  His MRI did look better than I expected and chances of  some meaningful recovery with rehab is good.  We would like to keep BP on the higher side given left ICA occlusion.  He is maximized on the phenylephrine.  We may consider adding Levophed depending on how he does.   Weston Settle, MS, MD  To contact Stroke Continuity provider, please refer to [email or web address]. After hours, contact General Neurology        Revision History

## 2018-08-29 NOTE — Progress Notes (Signed)
During night shift, very difficult to get patient to SBP to goal. 2 different pressors were maxed out, blood pressure cuff was repositioned, sedation was cut back or turned off. With sedation off or minimal, patient very restless and agitated. MD was paged to try new orders to get blood pressure to goal.

## 2018-08-29 NOTE — Progress Notes (Signed)
Patient's systolic blood pressure below 177 despite maximum rate of Levophed. SBP goal is 140-160. Switching sedation from propofol to Precedex due to lower likelihood of low BP with the latter. Cannot tolerate addition of a second pressor due to diffuse edema.   Electronically signed: Dr. Caryl Pina

## 2018-08-29 NOTE — Progress Notes (Signed)
Pharmacy Antibiotic Note  Darren Frazier is a 77 y.o. male admitted on 08/27/2018 with CVA.  Pt with elevated WBC and low grade fever.  Pharmacy has been consulted for Zosyn dosing for aspiration PNA.  Renal function with slight worsening but CrCl remains >38ml/min.  No Zosyn dose adjustment needed at this time.  Plan: Zosyn 3.375g IV q8h (4 hour infusion).  Height: 5\' 9"  (175.3 cm) Weight: (!) 313 lb 15 oz (142.4 kg) IBW/kg (Calculated) : 70.7  Temp (24hrs), Avg:100 F (37.8 C), Min:98.9 F (37.2 C), Max:100.8 F (38.2 C)  Recent Labs  Lab 08/27/18 1122 08/28/18 0600 08/28/18 1053 08/29/18 0416  WBC 10.9* 16.1* 16.6* 14.2*  CREATININE 1.02 0.98  --  1.23    Estimated Creatinine Clearance: 70.7 mL/min (by C-G formula based on SCr of 1.23 mg/dL).    Allergies  Allergen Reactions  . Ace Inhibitors Other (See Comments)    unknown  . Omeprazole Diarrhea  . Statins Other (See Comments)    Abnormal lab results(pt unsure of what labs)    Antimicrobials this admission: Zosyn 12/27 >>   Dose adjustments this admission:   Microbiology results: 12/26 TA >> 12/25 MRSA PCR postivie  Thank you for allowing pharmacy to be a part of this patient's care.  [employer], Pharm.D., BCPS Clinical Pharmacist  **Pharmacist phone directory can now be found on [email or web address] ([email or web address]).  Listed under [facility].  08/29/2018 11:43 AM

## 2018-08-29 NOTE — Progress Notes (Signed)
CRITICAL VALUE ALERT  Critical Value:  Troponin 3.59  Date & Time Notied:  08/29/18 0550  Provider Notified: Dr. Otelia Limes  Orders Received/Actions taken: Trend troponins. See physician note.

## 2018-08-30 ENCOUNTER — Inpatient Hospital Stay (HOSPITAL_COMMUNITY): Payer: Medicare HMO

## 2018-08-30 DIAGNOSIS — J9600 Acute respiratory failure, unspecified whether with hypoxia or hypercapnia: Secondary | ICD-10-CM

## 2018-08-30 DIAGNOSIS — D72829 Elevated white blood cell count, unspecified: Secondary | ICD-10-CM

## 2018-08-30 DIAGNOSIS — I482 Chronic atrial fibrillation, unspecified: Secondary | ICD-10-CM

## 2018-08-30 DIAGNOSIS — R509 Fever, unspecified: Secondary | ICD-10-CM

## 2018-08-30 DIAGNOSIS — J690 Pneumonitis due to inhalation of food and vomit: Secondary | ICD-10-CM

## 2018-08-30 LAB — BPAM RBC
Blood Product Expiration Date: 202001022359
ISSUE DATE / TIME: 201912271326
Unit Type and Rh: 9500

## 2018-08-30 LAB — CBC
HCT: 31.9 % — ABNORMAL LOW (ref 39.0–52.0)
Hemoglobin: 10.3 g/dL — ABNORMAL LOW (ref 13.0–17.0)
MCH: 32.7 pg (ref 26.0–34.0)
MCHC: 32.3 g/dL (ref 30.0–36.0)
MCV: 101.3 fL — ABNORMAL HIGH (ref 80.0–100.0)
Platelets: 251 10*3/uL (ref 150–400)
RBC: 3.15 MIL/uL — AB (ref 4.22–5.81)
RDW: 14.5 % (ref 11.5–15.5)
WBC: 15.5 10*3/uL — ABNORMAL HIGH (ref 4.0–10.5)
nRBC: 0.5 % — ABNORMAL HIGH (ref 0.0–0.2)

## 2018-08-30 LAB — BASIC METABOLIC PANEL
ANION GAP: 11 (ref 5–15)
BUN: 18 mg/dL (ref 8–23)
CO2: 19 mmol/L — ABNORMAL LOW (ref 22–32)
Calcium: 7.6 mg/dL — ABNORMAL LOW (ref 8.9–10.3)
Chloride: 107 mmol/L (ref 98–111)
Creatinine, Ser: 1.18 mg/dL (ref 0.61–1.24)
GFR calc Af Amer: >60 mL/min (ref >60)
GFR, EST NON AFRICAN AMERICAN: 59 mL/min — AB (ref >60)
Glucose, Bld: 191 mg/dL — ABNORMAL HIGH (ref 70–99)
Potassium: 4.1 mmol/L (ref 3.5–5.1)
Sodium: 137 mmol/L (ref 135–145)

## 2018-08-30 LAB — TYPE AND SCREEN
ABO/RH(D): O NEG
Antibody Screen: NEGATIVE
Unit division: 0

## 2018-08-30 MED ORDER — ASPIRIN EC 325 MG PO TBEC
325.0000 mg | DELAYED_RELEASE_TABLET | Freq: Every day | ORAL | Status: DC
  Administered 2018-08-30: 325 mg via ORAL
  Filled 2018-08-30 (×3): qty 1

## 2018-08-30 MED ORDER — LEVOTHYROXINE SODIUM 100 MCG/5ML IV SOLN
44.0000 ug | Freq: Every day | INTRAVENOUS | Status: DC
  Administered 2018-08-30 – 2018-09-03 (×5): 44 ug via INTRAVENOUS
  Filled 2018-08-30 (×5): qty 5

## 2018-08-30 MED ORDER — FAMOTIDINE IN NACL 20-0.9 MG/50ML-% IV SOLN
20.0000 mg | Freq: Two times a day (BID) | INTRAVENOUS | Status: DC
  Administered 2018-08-30 – 2018-09-01 (×6): 20 mg via INTRAVENOUS
  Filled 2018-08-30 (×6): qty 50

## 2018-08-30 MED ORDER — HYPROMELLOSE (GONIOSCOPIC) 2.5 % OP SOLN
1.0000 [drp] | OPHTHALMIC | Status: DC
  Filled 2018-08-30: qty 15

## 2018-08-30 MED ORDER — POLYVINYL ALCOHOL 1.4 % OP SOLN
1.0000 [drp] | OPHTHALMIC | Status: DC | PRN
  Administered 2018-08-30 – 2018-08-31 (×2): 1 [drp] via OPHTHALMIC
  Filled 2018-08-30: qty 15

## 2018-08-30 MED ORDER — INSULIN DETEMIR 100 UNIT/ML ~~LOC~~ SOLN
10.0000 [IU] | Freq: Every day | SUBCUTANEOUS | Status: DC
  Administered 2018-08-30 – 2018-09-17 (×19): 10 [IU] via SUBCUTANEOUS
  Filled 2018-08-30 (×20): qty 0.1

## 2018-08-30 NOTE — Progress Notes (Signed)
Progress Note  Patient Name: Darren Frazier Date of Encounter: 08/30/2018  Primary Cardiologist:   No primary care provider on file.   Subjective   Eyes open. Intubated.   Moves right side.  Non purposeful.    Inpatient Medications    Scheduled Meds: . chlorhexidine gluconate (MEDLINE KIT)  15 mL Mouth Rinse BID  . Chlorhexidine Gluconate Cloth  6 each Topical Q0600  . cholecalciferol  1,000 Units Per Tube Daily  . feeding supplement (PRO-STAT SUGAR FREE 64)  60 mL Per Tube QID  . feeding supplement (VITAL HIGH PROTEIN)  1,000 mL Per Tube Q24H  . insulin aspart  0-20 Units Subcutaneous Q4H  . insulin detemir  10 Units Subcutaneous Daily  . levothyroxine  44 mcg Intravenous Daily  . mouth rinse  15 mL Mouth Rinse 10 times per day  . multivitamin  15 mL Per Tube Daily  . mupirocin ointment  1 application Nasal BID   Continuous Infusions: . sodium chloride 50 mL/hr at 08/30/18 1100  . dexmedetomidine (PRECEDEX) IV infusion 1 mcg/kg/hr (08/30/18 1100)  . famotidine (PEPCID) IV Stopped (08/30/18 0851)  . fentaNYL infusion INTRAVENOUS 150 mcg/hr (08/30/18 1100)  . phenylephrine (NEO-SYNEPHRINE) Adult infusion 150 mcg/min (08/30/18 1158)  . piperacillin-tazobactam (ZOSYN)  IV Stopped (08/30/18 0950)   PRN Meds: acetaminophen **OR** acetaminophen (TYLENOL) oral liquid 160 mg/5 mL **OR** acetaminophen, fentaNYL, ondansetron (ZOFRAN) IV, senna-docusate   Vital Signs      Vitals:   08/30/18 0900 08/30/18 1000 08/30/18 1100 08/30/18 1140  BP: 114/68 125/75 124/67   Pulse: 94 (!) 115 (!) 106 81  Resp: (!) 25 (!) 32 (!) 25 (!) 24  Temp:      TempSrc:      SpO2: 95% 97% 98% 97%  Weight:      Height:       Physical Exam   GEN:No acute distress.   Neck:No  JVD Cardiac:RRR, no murmurs, rubs, or gallops.  Respiratory:Clear  to auscultation bilaterally. OM:BTDH, nontender, non-distended  MS:No  edema; No deformity.    Intake/Output Summary (Last 24 hours)  at 08/30/2018 1202 Last data filed at 08/30/2018 1100 Gross per 24 hour  Intake 5183.35 ml  Output 1700 ml  Net 3483.35 ml   Filed Weights   08/27/18 2013 08/29/18 0436 08/30/18 0412  Weight: (!) 146.8 kg (!) 142.4 kg (!) 143.5 kg    Telemetry    [place] fib with rate controlled - Personally Reviewed  ECG    NA - Personally Reviewed  Labs    Chemistry Recent Labs  Lab 08/28/18 0600 08/29/18 0416 08/30/18 0655  NA 138 136 137  K 4.0 4.1 4.1  CL 104 103 107  CO2 25 21* 19*  GLUCOSE 140* 232* 191*  BUN 12 11 18   CREATININE 0.98 1.23 1.18  CALCIUM 7.6* 7.9* 7.6*  GFRNONAA >60 56* 59*  GFRAA >60 >60 >60  ANIONGAP 9 12 11      Hematology Recent Labs  Lab 08/29/18 0416 08/29/18 1734 08/30/18 0545  WBC 14.2* 22.2* 15.5*  RBC 2.66* 3.11* 3.15*  HGB 8.7* 10.4* 10.3*  HCT 26.9* 32.8* 31.9*  MCV 101.1* 105.5* 101.3*  MCH 32.7 33.4 32.7  MCHC 32.3 31.7 32.3  RDW 13.9 14.9 14.5  PLT 165 233 251    Cardiac Enzymes Recent Labs  Lab 08/29/18 0353 08/29/18 0708 08/29/18 1201  TROPONINI 3.59* 6.39* 15.82*   No results for input(s): TROPIPOC in the last 168 hours.   BNPNo results for  input(s): BNP, PROBNP in the last 168 hours.   DDimer No results for input(s): DDIMER in the last 168 hours.   Radiology    Mr Brain Wo Contrast  Result Date: 08/28/2018 CLINICAL DATA:  Follow-up examination for acute stroke, right lower extremity weakness. Known left ICA occlusion. EXAM: MRI HEAD WITHOUT CONTRAST TECHNIQUE: Multiplanar, multiecho pulse sequences of the brain and surrounding structures were obtained without intravenous contrast. COMPARISON:  Prior CTs from 08/27/2018. FINDINGS: Brain: Generalized age-related cerebral atrophy. Patchy and confluent T2/FLAIR hyperintensity within the periventricular and deep white matter both cerebral hemispheres most consistent with chronic microvascular ischemic disease, moderate nature. Scatter remote cortical infarcts involving the  posterior left frontoparietal region, right parietal lobe, and right frontal lobe are seen. Small remote right cerebellar infarct. Remote lacunar infarct present at the left caudate head. Scattered chronic hemosiderin staining seen about several of these infarcts. Scattered multifocal foci of restricted diffusion involving the cortical subcortical left frontal, parietal, and temporal occipital regions, compatible with acute ischemic left MCA territory infarcts. Largest area of infarction seen at the high left frontal parietal region and measures 2.5 cm (series 3, image 48). Possible faint petechial hemorrhage about a few of these infarcts without frank hemorrhagic transformation or significant mass effect. Findings likely embolic in nature. No mass lesion, midline shift or mass effect. Mild diffuse ventricular prominence related global parenchymal volume loss without hydrocephalus. No extra-axial fluid collection. Pituitary gland within normal limits. Vascular: Abnormal flow void within the left ICA to its cavernous segment, compatible with previously identified left ICA occlusion. Major intracranial vascular flow voids otherwise maintained at the skull base. Skull and upper cervical spine: Craniocervical junction within normal limits. Upper cervical spine normal. Bone marrow signal intensity within normal limits. No scalp soft tissue abnormality. Sinuses/Orbits: Patient status post bilateral ocular lens replacement. Globes and orbital soft tissues demonstrate no acute finding. Scattered mucosal thickening seen throughout the paranasal sinuses with superimposed scattered air-fluid levels. Fluid seen within the nasopharynx. Patient likely intubated. Trace bilateral mastoid effusions noted. Inner ear structures normal. Other: None. IMPRESSION: 1. 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 Possible faint scattered associated petechial hemorrhage without frank  hemorrhagic transformation or significant mass effect. 2. Abnormal flow void within the left ICA to its cavernous segment, consistent with known left ICA occlusion. 3. 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 4. Moderately advanced cerebral atrophy with chronic small vessel ischemic disease. Electronically Signed   By: Jeannine Boga M.D.   On: 08/28/2018 15:33   Ct Abdomen Pelvis W Contrast  Result Date: 08/29/2018 CLINICAL DATA:  Abdominal distention.  Bloating. EXAM: CT ABDOMEN AND PELVIS WITH CONTRAST TECHNIQUE: Multidetector CT imaging of the abdomen and pelvis was performed using the standard protocol following bolus administration of intravenous contrast. CONTRAST:  131m OMNIPAQUE IOHEXOL 300 MG/ML  SOLN COMPARISON:  Abdominal radiograph dated 08/28/2018 and abdominal ultrasound dated 02/28/2016 FINDINGS: Lower chest: There are bilateral consolidative infiltrates in the lower lobes as well as minimal bilateral pleural effusions. Aortic atherosclerosis. Coronary artery calcifications. Hepatobiliary: Liver parenchyma is normal. No biliary ductal dilatation. Vicarious excretion of contrast in the normal appearing gallbladder. Pancreas: Unremarkable. No pancreatic ductal dilatation or surrounding inflammatory changes. Spleen: Normal in size without focal abnormality. Adrenals/Urinary Tract: Adrenal glands are normal. Normal right kidney. Large chronic cyst in the lower pole of the left kidney with some calcification in the wall. Maximum diameter of the cyst is approximately  9 cm. Stomach/Bowel: NG tube tip in the body of the stomach. There are few diverticula in the proximal sigmoid portion of the colon. There is moderate air in the colon without significant distention. Small bowel appears normal. Fluid and air in the stomach. Appendix has been removed. Vascular/Lymphatic: There is a poorly defined hematoma in the right  inguinal region in the panniculus. This measures approximately 12 x 5 x 2 cm and is probably related to recent angiography. There are several small lymph nodes in the right inguinal region, the largest being 15 mm in diameter. Extensive aortic atherosclerosis. No adenopathy. Reproductive: Dense calcification in the normal-sized prostate gland. Other: There is a small amount of ascites in the abdomen, nonspecific. Small periumbilical hernia containing only fat. Edema in the subcutaneous fat of both flanks, right greater than left. Slight edema in the panniculus and in the lateral aspect of both thighs. Musculoskeletal: No acute abnormalities. Multilevel degenerative disc disease in the thoracic spine and lumbar spine. IMPRESSION: 1. Small hematoma in the right inguinal region in the panniculus, probably secondary to the recent angiography. 2. Bilateral consolidative infiltrates in the posterior aspect of both lower lobes with tiny adjacent pleural effusions. 3. Small amount of nonspecific ascites. 4. Slightly prominent gas in the bowel without evidence of bowel obstruction. This probably represents an ileus. Electronically Signed   By: Lorriane Shire M.D.   On: 08/29/2018 17:03   Dg Chest Port 1 View  Result Date: 08/30/2018 CLINICAL DATA:  Respiratory failure. Cardiomyopathy. EXAM: PORTABLE CHEST 1 VIEW COMPARISON:  08/29/2018 and 08/27/2018 FINDINGS: Endotracheal tube and NG tube and central line appear unchanged and in good position. There has been progression of the bilateral diffuse pulmonary infiltrates, particularly in the right upper lung zone of the left lung base. Heart size and pulmonary vascularity are normal. No discrete effusions. Aortic atherosclerosis. No acute bone abnormality. IMPRESSION: Progressive bilateral pulmonary infiltrates. Aortic Atherosclerosis (ICD10-I70.0). Electronically Signed   By: Lorriane Shire M.D.   On: 08/30/2018 08:48   Dg Chest Port 1 View  Result Date:  08/29/2018 CLINICAL DATA:  Central line placement EXAM: PORTABLE CHEST 1 VIEW COMPARISON:  08/29/2018 FINDINGS: Right central line is been placed with the tip in the SVC. No pneumothorax. Endotracheal tube and NG tube are unchanged. Cardiomegaly with vascular congestion and mild pulmonary edema, stable. IMPRESSION: Right central line tip in the SVC. No pneumothorax. Continued mild CHF. Electronically Signed   By: Rolm Baptise M.D.   On: 08/29/2018 11:58   Dg Chest Port 1 View  Result Date: 08/29/2018 CLINICAL DATA:  PT [facility] to [facility] ED on 08/27/18 with complaints of right lower extremity weakness, dyspnea and chest pressure. Hx of stroke, Peripheral vascualar disease, cardiomyopathy, and A-fib. EXAM: PORTABLE CHEST 1 VIEW COMPARISON:  08/27/2018 FINDINGS: Mild enlargement of the cardiac silhouette, stable. No mediastinal or hilar masses. There is central vascular congestion. Mild hazy opacity noted in the perihilar regions and medial lung bases bilaterally. No convincing pleural effusion and no pneumothorax. Endotracheal tube tip projects 3.8 cm above the Carina. Nasogastric tube passes below the diaphragm well into the stomach. IMPRESSION: 1. Findings are consistent with mild congestive heart failure with evidence of slight improvement when compared to 08/27/2018. No new abnormalities. 2. Support apparatus is well positioned. Electronically Signed   By: Lajean Manes M.D.   On: 08/29/2018 07:26   Dg Abd Portable 1v  Result Date: 08/28/2018 CLINICAL DATA:  Orogastric tube placement. EXAM: PORTABLE ABDOMEN - 1 VIEW COMPARISON:  None.  FINDINGS: The patient's enteric tube is seen ending overlying the body of the stomach, with the side port about the fundus of the stomach. The visualized bowel gas pattern is grossly unremarkable. A small left pleural effusion is noted. Increased interstitial markings raise concern for pulmonary edema. Vascular congestion is noted. The patient's endotracheal tube is seen  ending 2-3 cm above the carina. No acute osseous abnormalities are identified. IMPRESSION: 1. Enteric tube seen ending overlying the body of the stomach, with the side port about the fundus of the stomach. 2. Small left pleural effusion noted. Increased interstitial markings raise concern for pulmonary edema. Electronically Signed   By: Garald Balding M.D.   On: 08/28/2018 22:08    Cardiac Studies   Study Conclusions  - Left ventricle: The cavity size was normal. Systolic function was   normal. The estimated ejection fraction was in the range of 60%   to 65%. Images were inadequate for LV wall motion assessment. The   study is not technically sufficient to allow evaluation of LV   diastolic function. - Aortic valve: Valve mobility was moderately restricted.   Suboptimal image quality limited Doppler assessment of aortic   valve systolic gradient. There was no regurgitation. - Mitral valve: Moderately calcified annulus. There was trivial   regurgitation. - Left atrium: The atrium was moderately dilated. - Tricuspid valve: There was trivial regurgitation.   Patient Profile     77 y.o. 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 We are involved for management of his atrial fib.   Assessment & Plan    Atrial fib:  Rate is controlled without meds. IR notes reviewed.  Evidence of petechial hemorrhage and groin bleeding post procedure so anticoagulation held.  However, we will need to consider when it will be safe to start anticoagulation and I will defer to neurology.      For questions or updates, please contact [place] Please consult [email or web address] for contact info under Cardiology/STEMI.   Signed, Minus Breeding, MD  08/30/2018,  12:02 PM

## 2018-08-30 NOTE — Progress Notes (Signed)
Patient vomited large amount of tube feeding. Tube feeds turned off and placed OG to low intermittent suction. Suctioned copious amount of tan/tube feed secretions from ETT.

## 2018-08-30 NOTE — Progress Notes (Signed)
NAME:  Darren Frazier, MRN:  [id number], DOB:  Nov 18, 1940, LOS: 3 ADMISSION DATE:  08/27/2018, CONSULTATION DATE: 08/27/2018 REFERRING MD:  Dr. Corliss Skainseveshwar, CHIEF COMPLAINT:  Rt leg weakness   Brief History   77 yo [facility] with Rt leg numbness and weakness.  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  [facility]  He was intubated for neuro-IR intervention.  Past Medical History  A fib on pradaxa, HTN, Urethral stricture, cardiomyopathy, PAD, HLD, CVA, lymph edema, Vitamin D deficiency, Neuropathy, Hypothyroidism, DM  Significant Hospital Events   12/25 Admit, arteriogram by IR 12/26 A fib with RVR elevated troponin, progressive anemia, hypotension requiring pressors 12/27 Transfuse 1 unit PRBC, change to pressure control 12/28 vomiting, tube feeds held  Consults:  Neurology Neuro IR Cardiology  Procedures:  ETT 12/25 >>  Rt IJ CVL 12/27 >>  Significant Diagnostic Tests:  CT angio head/neck 12/25 >> occlusion of Lt ICA Echo 12/26 >> EF 60 to 65% CT abd/pelvis 12/27 >> small hematoma Rt inguinal region, b/l lower lung AD, ileus  Micro Data:  Sputum 12/26 >>   Antimicrobials:  Zosyn 12/27 >>   Interim history/subjective:  Episode of vomiting tube feeds overnight.    Objective   Blood pressure (!) 151/91, pulse 80, temperature (!) 101.3 F (38.5 C), temperature source Axillary, resp. rate 11, height 5\' 9"  (1.753 m), weight (!) 143.5 kg, SpO2 100 %.    Vent Mode: PCV FiO2 (%):  [50 %] 50 % Set Rate:  [20 bmp] 20 bmp PEEP:  [8 cmH20-10 cmH20] 10 cmH20 Plateau Pressure:  [26 cmH20-41 cmH20] 26 cmH20   Intake/Output Summary (Last 24 hours) at 08/30/2018 0723 Last data filed at 08/30/2018 0700 Gross per 24 hour  Intake 7240.33 ml  Output 1500 ml  Net 5740.33 ml   Filed Weights   08/27/18 2013 08/29/18 0436 08/30/18 0412  Weight: (!) 146.8 kg (!) 142.4 kg (!) 143.5 kg    Examination:  General - sedated Eyes - pupils pinpoint ENT - ETT  in place Cardiac - irregular Chest - b/l rhonchi Abdomen - soft, distended, decreased bowel sounds, increased tympany GU - foley in place Extremities - 1+ non pitting edema Skin - no rashes Neuro - RASS -3  CXR 12/28 (reviewed by me) >> b/l ASD     Resolved Hospital Problem list     Assessment & Plan:   Acute respiratory failure with compromised airway in setting of CVA. Plan - pressure control ventilation - f/u CXR, ABG  Aspiration pneumonia. Plan - day 2 of zosyn  Ischemic CVA from Lt ICA occlusion. Plan - ASA per neurology  Acute blood loss anemia from cath site bleeding. Plan - f/u CBC - transfuse for Hb < 7  A fib with RVR. Hx of HTN, HLD, elevated troponin. Plan - anticoagulation per cardiology and neurology - goal SBP 140 to 160 per neurology >> might be able to liberalize BP goals - hold outpt norvasc, zebata, HCTZ, cozaar   Ileus. Plan - try to limit opiate medications - hold tube feeds for now - change meds to IV as able  DM type II with peripheral neuropathy. Hypothyroidism. Plan - SSI - add levemir 12/28 - continue synthroid - hold outpt metformin, neurontin  Possible contrast extravasation in Rt arm 12/27. Plan - monitor clinically   Best practice:  Diet: tube feeds DVT prophylaxis: SCDs GI prophylaxis: Protonix Mobility: Bed rest Code Status: Full code Family Communication: no family at bedside  Labs    CMP Latest Ref Rng & Units 08/29/2018 08/28/2018 08/27/2018  Glucose 70 - 99 mg/dL 740(C) 144(Y) 185(U)  BUN 8 - 23 mg/dL 11 12 21   Creatinine 0.61 - 1.24 mg/dL 3.14 9.70 2.63  Sodium 135 - 145 mmol/L 136 138 136  Potassium 3.5 - 5.1 mmol/L 4.1 4.0 3.8  Chloride 98 - 111 mmol/L 103 104 99  CO2 22 - 32 mmol/L 21(L) 25 25  Calcium 8.9 - 10.3 mg/dL 7.9(L) 7.6(L) 9.4  Total Protein 6.4 - 8.2 g/dL - - -  Total Bilirubin 0.2 - 1.0 mg/dL - - -  Alkaline Phos 50 - 136 Unit/L - - -  AST 15 - 37 Unit/L - - -  ALT 12 - 78 U/L -  - -   CBC Latest Ref Rng & Units 08/30/2018 08/29/2018 08/29/2018  WBC 4.0 - 10.5 K/uL 15.5(H) 22.2(H) 14.2(H)  Hemoglobin 13.0 - 17.0 g/dL 10.3(L) 10.4(L) 8.7(L)  Hematocrit 39.0 - 52.0 % 31.9(L) 32.8(L) 26.9(L)  Platelets 150 - 400 K/uL 251 233 165   ABG    Component Value Date/Time   PHART 7.364 08/27/2018 2238   PCO2ART 44.7 08/27/2018 2238   PO2ART 99.0 08/27/2018 2238   HCO3 25.5 08/27/2018 2238   TCO2 27 08/27/2018 2238   O2SAT 97.0 08/27/2018 2238   CBG (last 3)  Recent Labs    08/29/18 0318 08/29/18 0442 08/29/18 0840  GLUCAP 253* 229* 222*   CC time 31 minutes  Coralyn Helling, MD [facility] Pulmonary/Critical Care 08/30/2018, 7:23 AM

## 2018-08-30 NOTE — Progress Notes (Signed)
SBP range >110 <180 per Dr. Roda Shutters

## 2018-08-30 NOTE — Progress Notes (Signed)
STROKE TEAM PROGRESS NOTE   SUBJECTIVE (INTERVAL HISTORY) His wife and RN are at the bedside.  Pt still on sedation and intubated. Not following commands. Still agitated and moving left UE which is on restrain.   OBJECTIVE Temp:  [98.5 F (36.9 C)-101.3 F (38.5 C)] 101 F (38.3 C) (12/28 0800) Pulse Rate:  [69-117] 115 (12/28 1000) Cardiac Rhythm: Atrial fibrillation (12/28 0800) Resp:  [11-32] 32 (12/28 1000) BP: (82-196)/(51-123) 125/75 (12/28 1000) SpO2:  [85 %-100 %] 97 % (12/28 1000) FiO2 (%):  [50 %] 50 % (12/28 0832) Weight:  [143.5 kg] 143.5 kg (12/28 0412)  Recent Labs  Lab 08/28/18 1959 08/28/18 2306 08/29/18 0318 08/29/18 0442 08/29/18 0840  GLUCAP 182* 198* 253* 229* 222*   Recent Labs  Lab 08/27/18 1122 08/28/18 0600 08/28/18 0921 08/28/18 1826 08/29/18 0416 08/29/18 1905 08/30/18 0655  NA 136 138  --   --  136  --  137  K 3.8 4.0  --   --  4.1  --  4.1  CL 99 104  --   --  103  --  107  CO2 25 25  --   --  21*  --  19*  GLUCOSE 190* 140*  --   --  232*  --  191*  BUN 21 12  --   --  11  --  18  CREATININE 1.02 0.98  --   --  1.23  --  1.18  CALCIUM 9.4 7.6*  --   --  7.9*  --  7.6*  MG  --   --  1.5* 1.5* 1.6* 1.7  --   PHOS  --   --  3.4 3.3 3.2 2.2*  --    No results for input(s): AST, ALT, ALKPHOS, BILITOT, PROT, ALBUMIN in the last 168 hours. Recent Labs  Lab 08/28/18 0600 08/28/18 1053 08/29/18 0416 08/29/18 1734 08/30/18 0545  WBC 16.1* 16.6* 14.2* 22.2* 15.5*  NEUTROABS 12.4*  --   --   --   --   HGB 9.2* 9.4* 8.7* 10.4* 10.3*  HCT 28.6* 29.6* 26.9* 32.8* 31.9*  MCV 99.0 99.3 101.1* 105.5* 101.3*  PLT 229 240 165 233 251   Recent Labs  Lab 08/29/18 0353 08/29/18 0708 08/29/18 1201  TROPONINI 3.59* 6.39* 15.82*   Recent Labs    08/27/18 2200  LABPROT 17.9*  INR 1.50   Recent Labs    08/27/18 1122  COLORURINE YELLOW*  LABSPEC 1.027  PHURINE 6.0  GLUCOSEU NEGATIVE  HGBUR NEGATIVE  BILIRUBINUR NEGATIVE  KETONESUR  NEGATIVE  PROTEINUR 30*  NITRITE NEGATIVE  LEUKOCYTESUR TRACE*       Component Value Date/Time   CHOL 138 08/28/2018 0600   TRIG 171 (H) 08/28/2018 0921   HDL 24 (L) 08/28/2018 0600   CHOLHDL 5.8 08/28/2018 0600   VLDL 33 08/28/2018 0600   LDLCALC 81 08/28/2018 0600   Lab Results  Component Value Date   HGBA1C 7.1 (H) 08/28/2018   No results found for: LABOPIA, COCAINSCRNUR, LABBENZ, AMPHETMU, THCU, LABBARB  No results for input(s): ETH in the last 168 hours.  I have personally reviewed the radiological images below and agree with the radiology interpretations.  Ct Angio Head W Or Wo Contrast  Result Date: 08/27/2018 CLINICAL DATA:  Acute presentation with speech disturbance. EXAM: CT ANGIOGRAPHY HEAD AND NECK TECHNIQUE: Multidetector CT imaging of the head and neck was performed using the standard protocol during bolus administration of intravenous contrast. Multiplanar CT image reconstructions  and MIPs were obtained to evaluate the vascular anatomy. Carotid stenosis measurements (when applicable) are obtained utilizing NASCET criteria, using the distal internal carotid diameter as the denominator. CONTRAST:  75mL ISOVUE-370 IOPAMIDOL (ISOVUE-370) INJECTION 76% COMPARISON:  CT same day. FINDINGS: CTA NECK FINDINGS Aortic arch: Aortic atherosclerosis.  No aneurysm or dissection. Right carotid system: Common carotid artery shows some atherosclerotic plaque but is widely patent to the bifurcation. There is soft and calcified plaque affecting the carotid bifurcation and ICA bulb region. There is severe stenosis in the distal bulb region with luminal diameter of 1 mm. The vessel is quite tortuous beyond that but widely patent to the upper cervical region, where there is soft and calcified plaque resulting in minimal diameter of 3.5 mm. Compared to an expected diameter of 5 mm, stenosis in the bulb is 80% or greater. Stenosis in the upper cervical region is 30%. Left carotid system: Common  carotid artery shows atherosclerotic plaque but is sufficiently patent to the bifurcation region. There is calcified and soft plaque at the carotid bifurcation and ICA bulb. There is left internal carotid artery occlusion at the distal bulb. 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 The left vertebral artery shows 50% stenosis at its origin but is sufficiently patent beyond that through the cervical region to the foramen magnum. Skeleton: Mid cervical spondylosis. Other neck: No mass or lymphadenopathy. Upper chest: Interstitial prominence which could go along with fluid overload or mild edema. Review of the MIP images confirms the above findings CTA HEAD FINDINGS Anterior circulation: Left internal carotid artery is occluded without antegrade flow through the skull base. Right internal carotid artery shows atherosclerotic disease in the carotid siphon region with stenosis estimated at 50-70%. The anterior and middle cerebral vessels are patent. There is atherosclerotic irregularity of the M1 segment with stenosis of 50-70%. No acute vessel occlusion is identified. There is a chronic punctate calcified embolus in 1 of the insular branches, but this was present in 2014. On the left, there is reconstituted flow in the distal siphon. Severe stenosis of the supraclinoid ICA, 80% or greater. Flow is present in the anterior and middle cerebral vessels, secondary to reconstituted flow and flow through communicating arteries. There is 50% stenosis in the M1 segment. I do not see any occluded or missing large or medium vessels in the MCA territory. Posterior circulation: Right vertebral artery shows no antegrade flow at the foramen magnum. There is retrograde flow in the distal right vertebral. Left vertebral artery is patent at the foramen magnum. There is atherosclerotic disease of the V4 segment with stenosis estimated at 70%. There are serial stenoses. The vessel does show flow to the  basilar, which shows atherosclerotic irregularity but is patent. Flow is present in the superior cerebellar and posterior cerebral arteries. Venous sinuses: Patent and normal. Anatomic variants: None significant. Delayed phase: No abnormal enhancement. 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 Severe irregular atherosclerotic disease of the right carotid bifurcation with stenosis of 80% in the bulb region and 30% in the distal ICA. 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 50% stenosis of the left vertebral artery origin. Severe atherosclerotic disease in both carotid siphon regions. Right siphon stenosis estimated at 50-70%. Reconstituted flow in the distal left siphon and supraclinoid region. 80% supraclinoid stenosis. Flow in both anterior and middle cerebral artery territories. Stenosis of both M1 segments estimated at 50-70%. I do not  identify any large or medium vessel occlusion acutely within the MCA branch vessels. Severely disease left V4 segment with serial stenoses proximal to the basilar estimated at 70%. Basilar atherosclerotic irregularity without flow limiting stenosis. Posterior circulation branch vessels do show flow, including right PICA which is supplied by retrograde flow in the distal right vertebral artery. Electronically Signed   By: Paulina Fusi M.D.   On: 08/27/2018 13:14   Dg Chest 2 View  Result Date: 08/27/2018 CLINICAL DATA:  Chest pain and shortness of breath.  Cardiomyopathy. EXAM: CHEST - 2 VIEW COMPARISON:  03/07/2013 FINDINGS: Mild cardiomegaly. Aortic atherosclerosis. Diffuse interstitial infiltrates, consistent with pulmonary edema. Mild subsegmental atelectasis also seen in the left upper lobe. No evidence of pulmonary consolidation or significant pleural effusion. IMPRESSION: Mild cardiomegaly and diffuse interstitial edema, consistent with congestive heart failure. Electronically  Signed   By: Myles Rosenthal M.D.   On: 08/27/2018 12:02   Ct Angio Neck W Or Wo Contrast  Result Date: 08/27/2018 CLINICAL DATA:  Acute presentation with speech disturbance. EXAM: CT ANGIOGRAPHY HEAD AND NECK TECHNIQUE: Multidetector CT imaging of the head and neck was performed using the standard protocol during bolus administration of intravenous contrast. Multiplanar CT image reconstructions and MIPs were obtained to evaluate the vascular anatomy. Carotid stenosis measurements (when applicable) are obtained utilizing NASCET criteria, using the distal internal carotid diameter as the denominator. CONTRAST:  75mL ISOVUE-370 IOPAMIDOL (ISOVUE-370) INJECTION 76% COMPARISON:  CT same day. FINDINGS: CTA NECK FINDINGS Aortic arch: Aortic atherosclerosis.  No aneurysm or dissection. Right carotid system: Common carotid artery shows some atherosclerotic plaque but is widely patent to the bifurcation. There is soft and calcified plaque affecting the carotid bifurcation and ICA bulb region. There is severe stenosis in the distal bulb region with luminal diameter of 1 mm. The vessel is quite tortuous beyond that but widely patent to the upper cervical region, where there is soft and calcified plaque resulting in minimal diameter of 3.5 mm. Compared to an expected diameter of 5 mm, stenosis in the bulb is 80% or greater. Stenosis in the upper cervical region is 30%. Left carotid system: Common carotid artery shows atherosclerotic plaque but is sufficiently patent to the bifurcation region. There is calcified and soft plaque at the carotid bifurcation and ICA bulb. There is left internal carotid artery occlusion at the distal bulb. 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 The left vertebral artery shows 50% stenosis at its origin but is sufficiently patent beyond that through the cervical region to the foramen magnum. Skeleton: Mid cervical spondylosis. Other neck: No mass or lymphadenopathy.  Upper chest: Interstitial prominence which could go along with fluid overload or mild edema. Review of the MIP images confirms the above findings CTA HEAD FINDINGS Anterior circulation: Left internal carotid artery is occluded without antegrade flow through the skull base. Right internal carotid artery shows atherosclerotic disease in the carotid siphon region with stenosis estimated at 50-70%. The anterior and middle cerebral vessels are patent. There is atherosclerotic irregularity of the M1 segment with stenosis of 50-70%. No acute vessel occlusion is identified. There is a chronic punctate calcified embolus in 1 of the insular branches, but this was present in 2014. On the left, there is reconstituted flow in the distal siphon. Severe stenosis of the supraclinoid ICA, 80% or greater. Flow is present in the anterior and middle cerebral vessels, secondary to reconstituted flow and flow through communicating arteries. There is 50% stenosis in the M1 segment.  I do not see any occluded or missing large or medium vessels in the MCA territory. Posterior circulation: Right vertebral artery shows no antegrade flow at the foramen magnum. There is retrograde flow in the distal right vertebral. Left vertebral artery is patent at the foramen magnum. There is atherosclerotic disease of the V4 segment with stenosis estimated at 70%. There are serial stenoses. The vessel does show flow to the basilar, which shows atherosclerotic irregularity but is patent. Flow is present in the superior cerebellar and posterior cerebral arteries. Venous sinuses: Patent and normal. Anatomic variants: None significant. Delayed phase: No abnormal enhancement. 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 Severe irregular atherosclerotic disease of the right carotid bifurcation with stenosis of 80% in the bulb region and 30% in the distal ICA. 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 50% stenosis of the left vertebral artery origin. Severe atherosclerotic disease in both carotid siphon regions. Right siphon stenosis estimated at 50-70%. Reconstituted flow in the distal left siphon and supraclinoid region. 80% supraclinoid stenosis. Flow in both anterior and middle cerebral artery territories. Stenosis of both M1 segments estimated at 50-70%. I do not identify any large or medium vessel occlusion acutely within the MCA branch vessels. Severely disease left V4 segment with serial stenoses proximal to the basilar estimated at 70%. Basilar atherosclerotic irregularity without flow limiting stenosis. Posterior circulation branch vessels do show flow, including right PICA which is supplied by retrograde flow in the distal right vertebral artery. Electronically Signed   By: Paulina Fusi M.D.   On: 08/27/2018 13:14   Mr Brain Wo Contrast  Result Date: 08/28/2018 CLINICAL DATA:  Follow-up examination for acute stroke, right lower extremity weakness. Known left ICA occlusion. EXAM: MRI HEAD WITHOUT CONTRAST TECHNIQUE: Multiplanar, multiecho pulse sequences of the brain and surrounding structures were obtained without intravenous contrast. COMPARISON:  Prior CTs from 08/27/2018. FINDINGS: Brain: Generalized age-related cerebral atrophy. Patchy and confluent T2/FLAIR hyperintensity within the periventricular and deep white matter both cerebral hemispheres most consistent with chronic microvascular ischemic disease, moderate nature. Scatter remote cortical infarcts involving the posterior left frontoparietal region, right parietal lobe, and right frontal lobe are seen. Small remote right cerebellar infarct. Remote lacunar infarct present at the left caudate head. Scattered chronic hemosiderin staining seen about several of these infarcts. Scattered multifocal foci of restricted diffusion involving the cortical subcortical left frontal, parietal, and temporal occipital regions,  compatible with acute ischemic left MCA territory infarcts. Largest area of infarction seen at the high left frontal parietal region and measures 2.5 cm (series 3, image 48). Possible faint petechial hemorrhage about a few of these infarcts without frank hemorrhagic transformation or significant mass effect. Findings likely embolic in nature. No mass lesion, midline shift or mass effect. Mild diffuse ventricular prominence related global parenchymal volume loss without hydrocephalus. No extra-axial fluid collection. Pituitary gland within normal limits. Vascular: Abnormal flow void within the left ICA to its cavernous segment, compatible with previously identified left ICA occlusion. Major intracranial vascular flow voids otherwise maintained at the skull base. Skull and upper cervical spine: Craniocervical junction within normal limits. Upper cervical spine normal. Bone marrow signal intensity within normal limits. No scalp soft tissue abnormality. Sinuses/Orbits: Patient status post bilateral ocular lens replacement. Globes and orbital soft tissues demonstrate no acute finding. Scattered mucosal thickening seen throughout the paranasal sinuses with superimposed scattered air-fluid levels. Fluid seen within the nasopharynx. Patient likely intubated. Trace bilateral  mastoid effusions noted. Inner ear structures normal. Other: None. IMPRESSION: 1. 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 Possible faint scattered associated petechial hemorrhage without frank hemorrhagic transformation or significant mass effect. 2. Abnormal flow void within the left ICA to its cavernous segment, consistent with known left ICA occlusion. 3. 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 4. Moderately advanced cerebral atrophy with chronic small vessel ischemic disease. Electronically Signed   By:  Rise Mu M.D.   On: 08/28/2018 15:33   Ct Abdomen Pelvis W Contrast  Result Date: 08/29/2018 CLINICAL DATA:  Abdominal distention.  Bloating. EXAM: CT ABDOMEN AND PELVIS WITH CONTRAST TECHNIQUE: Multidetector CT imaging of the abdomen and pelvis was performed using the standard protocol following bolus administration of intravenous contrast. CONTRAST:  OMNIPAQUE IOHEXOL 300 MG/ML  SOLN COMPARISON:  Abdominal radiograph dated 08/28/2018 and abdominal ultrasound dated 02/28/2016 FINDINGS: Lower chest: There are bilateral consolidative infiltrates in the lower lobes as well as minimal bilateral pleural effusions. Aortic atherosclerosis. Coronary artery calcifications. Hepatobiliary: Liver parenchyma is normal. No biliary ductal dilatation. Vicarious excretion of contrast in the normal appearing gallbladder. Pancreas: Unremarkable. No pancreatic ductal dilatation or surrounding inflammatory changes. Spleen: Normal in size without focal abnormality. Adrenals/Urinary Tract: Adrenal glands are normal. Normal right kidney. Large chronic cyst in the lower pole of the left kidney with some calcification in the wall. Maximum diameter of the cyst is approximately 9 cm. Stomach/Bowel: NG tube tip in the body of the stomach. There are few diverticula in the proximal sigmoid portion of the colon. There is moderate air in the colon without significant distention. Small bowel appears normal. Fluid and air in the stomach. Appendix has been removed. Vascular/Lymphatic: There is a poorly defined hematoma in the right inguinal region in the panniculus. This measures approximately 12 x 5 x 2 cm and is probably related to recent angiography. There are several small lymph nodes in the right inguinal region, the largest being 15 mm in diameter. Extensive aortic atherosclerosis. No adenopathy. Reproductive: Dense calcification in the normal-sized prostate gland. Other: There is a small amount of ascites in the abdomen,  nonspecific. Small periumbilical hernia containing only fat. Edema in the subcutaneous fat of both flanks, right greater than left. Slight edema in the panniculus and in the lateral aspect of both thighs. Musculoskeletal: No acute abnormalities. Multilevel degenerative disc disease in the thoracic spine and lumbar spine. IMPRESSION: 1. Small hematoma in the right inguinal region in the panniculus, probably secondary to the recent angiography. 2. Bilateral consolidative infiltrates in the posterior aspect of both lower lobes with tiny adjacent pleural effusions. 3. Small amount of nonspecific ascites. 4. Slightly prominent gas in the bowel without evidence of bowel obstruction. This probably represents an ileus. Electronically Signed   By: Francene Boyers M.D.   On: 08/29/2018 17:03   Ct Cerebral Perfusion W Contrast  Result Date: 08/27/2018 CLINICAL DATA:  Right-sided weakness onset this morning. EXAM: CT PERFUSION BRAIN TECHNIQUE: Multiphase CT imaging of the brain was performed following IV bolus contrast injection. Subsequent parametric perfusion maps were calculated using RAPID software. CONTRAST:  40mL ISOVUE-370 IOPAMIDOL (ISOVUE-370) INJECTION 76% COMPARISON:  CT a head today FINDINGS: CT Brain Perfusion Findings: CBF (<30%) Volume: 0mL Perfusion (Tmax>6.0s) volume: Mismatch Volume: ASPECTS on noncontrast CT Head today, not calculated due to extensive chronic ischemic change. No definite acute infarct on CT. Infarct Core: 0 mL Infarction Location:Delayed perfusion  throughout the left MCA territory with apparent sparing of the basal ganglia. There is occlusion of the left internal carotid artery on CTA, age indeterminate. Left middle cerebral artery is diseased and may have delayed perfusion due to collateral circulation and stenosis. IMPRESSION: 155 mL of delayed perfusion in the left MCA territory. No core infarction. Delayed perfusion left MCA territory may be due to severe intracranial  atherosclerotic disease as well as occlusion of the left internal carotid artery. Age of the internal carotid artery occlusion is indeterminate. It is possible this delayed perfusion is a chronic or possibly acute finding. Electronically Signed   By: Marlan Palau M.D.   On: 08/27/2018 16:12   Dg Chest Port 1 View  Result Date: 08/30/2018 CLINICAL DATA:  Respiratory failure. Cardiomyopathy. EXAM: PORTABLE CHEST 1 VIEW COMPARISON:  08/29/2018 and 08/27/2018 FINDINGS: Endotracheal tube and NG tube and central line appear unchanged and in good position. There has been progression of the bilateral diffuse pulmonary infiltrates, particularly in the right upper lung zone of the left lung base. Heart size and pulmonary vascularity are normal. No discrete effusions. Aortic atherosclerosis. No acute bone abnormality. IMPRESSION: Progressive bilateral pulmonary infiltrates. Aortic Atherosclerosis (ICD10-I70.0). Electronically Signed   By: Francene Boyers M.D.   On: 08/30/2018 08:48   Dg Chest Port 1 View  Result Date: 08/29/2018 CLINICAL DATA:  Central line placement EXAM: PORTABLE CHEST 1 VIEW COMPARISON:  08/29/2018 FINDINGS: Right central line is been placed with the tip in the SVC. No pneumothorax. Endotracheal tube and NG tube are unchanged. Cardiomegaly with vascular congestion and mild pulmonary edema, stable. IMPRESSION: Right central line tip in the SVC. No pneumothorax. Continued mild CHF. Electronically Signed   By: Charlett Nose M.D.   On: 08/29/2018 11:58   Dg Chest Port 1 View  Result Date: 08/29/2018 CLINICAL DATA:  PT presented to [facility] ED on 08/27/18 with complaints of right lower extremity weakness, dyspnea and chest pressure. Hx of stroke, Peripheral vascualar disease, cardiomyopathy, and A-fib. EXAM: PORTABLE CHEST 1 VIEW COMPARISON:  08/27/2018 FINDINGS: Mild enlargement of the cardiac silhouette, stable. No mediastinal or hilar masses. There is central vascular congestion. Mild hazy opacity  noted in the perihilar regions and medial lung bases bilaterally. No convincing pleural effusion and no pneumothorax. Endotracheal tube tip projects 3.8 cm above the Carina. Nasogastric tube passes below the diaphragm well into the stomach. IMPRESSION: 1. Findings are consistent with mild congestive heart failure with evidence of slight improvement when compared to 08/27/2018. No new abnormalities. 2. Support apparatus is well positioned. Electronically Signed   By: Amie Portland M.D.   On: 08/29/2018 07:26   Dg Chest Port 1 View  Result Date: 08/28/2018 CLINICAL DATA:  Shortness of Breath EXAM: PORTABLE CHEST 1 VIEW COMPARISON:  08/27/2018 FINDINGS: Cardiac shadow is enlarged in size. Aortic calcifications are noted. Endotracheal tube is noted at the level of the carina. This should be withdrawn 2-3 cm. Nasogastric catheter is noted within the stomach. Lungs are well aerated bilaterally with mild interstitial edema similar to that seen on the prior exam. IMPRESSION: Interval intubation with the endotracheal tube at the level of the carina. This should be withdrawn 2-3 cm. Stable edema bilaterally. These results will be called to the ordering clinician or representative by the Radiologist Assistant, and communication documented in the PACS or zVision Dashboard. Electronically Signed   By: Alcide Clever M.D.   On: 08/28/2018 00:19   Dg Abd Portable 1v  Result Date: 08/28/2018 CLINICAL DATA:  Orogastric  tube placement. EXAM: PORTABLE ABDOMEN - 1 VIEW COMPARISON:  None. FINDINGS: The patient's enteric tube is seen ending overlying the body of the stomach, with the side port about the fundus of the stomach. The visualized bowel gas pattern is grossly unremarkable. A small left pleural effusion is noted. Increased interstitial markings raise concern for pulmonary edema. Vascular congestion is noted. The patient's endotracheal tube is seen ending 2-3 cm above the carina. No acute osseous abnormalities are  identified. IMPRESSION: 1. Enteric tube seen ending overlying the body of the stomach, with the side port about the fundus of the stomach. 2. Small left pleural effusion noted. Increased interstitial markings raise concern for pulmonary edema. Electronically Signed   By: Roanna RaiderJeffery  Chang M.D.   On: 08/28/2018 22:08   Ct Head Code Stroke Wo Contrast`  Result Date: 08/27/2018 CLINICAL DATA:  Code stroke. Slurred speech beginning 3 weeks ago. Right-sided weakness beginning today. EXAM: CT HEAD WITHOUT CONTRAST TECHNIQUE: Contiguous axial images were obtained from the base of the skull through the vertex without intravenous contrast. COMPARISON:  CT 03/07/2013 FINDINGS: Brain: The brainstem and cerebellum appear normal by CT. Cerebral hemispheres show age related volume loss. There are old cortical and subcortical infarctions in the right frontal lobe, left parietal vertex, left frontal lobe, and left frontoparietal vertex. The show atrophy and encephalomalacia with gliosis. There is no finding of acute or subacute infarction on this CT. There is old lacunar infarction in the left caudate. Vascular: There is atherosclerotic calcification of the major vessels at the base of the brain. No evidence of acute hyperdense vessel. Skull: Negative Sinuses/Orbits: Clear/normal Other: None ASPECTS (Alberta Stroke Program Early CT Score) Aspects is difficult in this patient with old infarctions. I do not suspect an acute insult. IMPRESSION: 1. No acute finding by CT. 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 2. 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 No acute finding is suspected by CT. 3. These results were called by telephone at the time of interpretation on 08/27/2018 at 11:47 am to Dr. Daryel NovemberJONATHAN WILLIAMS , who verbally acknowledged these results. Electronically Signed   By: Paulina FusiMark  Shogry M.D.   On:  08/27/2018 11:49    PHYSICAL EXAM  Temp:  [98.5 F (36.9 C)-101.3 F (38.5 C)] 101 F (38.3 C) (12/28 0800) Pulse Rate:  [69-117] 115 (12/28 1000) Resp:  [11-32] 32 (12/28 1000) BP: (82-196)/(51-123) 125/75 (12/28 1000) SpO2:  [85 %-100 %] 97 % (12/28 1000) FiO2 (%):  [50 %] 50 % (12/28 0832) Weight:  [143.5 kg] 143.5 kg (12/28 0412)  General - Well nourished, well developed, intubated on sedation.  Diffuse Anasarca  Ophthalmologic - fundi not visualized due to noncooperation.  Cardiovascular - irregularly irregular heart rate and rhythm.  Neuro - intubated on sedation, not following commands, eyes open.  Mild blinking to visual threat on the left, but not on the right.  PERRL, not tracking objects.  Facial symmetry not able to test due to ET tube.  Tongue protrusion not corporative.  Left upper extremity on restrain, but strong on bicep and finger movement.  Bilateral lower extremity and right upper extremity no movement on pain stimulation.  DTR diminished, Babinski negative.  Sensation, coordination and gait not tested.    ASSESSMENT/PLAN Mr.Emmaus Elie GoodyOgden Markhamis a 77 y.o.malewith history of HTN, atrial fibrillation on Pradaxa last dose last evening, urethral stricture, cardiomyopathy, peripheral vascular disease, hypertension, hyperlipidemia, stroke with some balance issues, bilateral  lymphedema vitamin D deficiencypresenting with right lower extremity numbness and weaknessand suspected new Lt ICA occlusion.Hedid not receive IV t-PA due to late presentation.  Stroke:Lt MCA and ACA territory scattered small infarcts - embolic pattern, likely due to left ICA acute on chronic occlusion.  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  MRI head -left MCA and ACA territory scattered small infarcts  CTA H&N - left ICA, right VA occlusion.  Right ICA 80% stenosis and bilateral  siphon stenosis.  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  2D Echo - EF 60 to 65%  LDL- 81  HgbA1c- 7.1  VTE prophylaxis -SCDs  aspirin 81 mg daily and Pradaxa (dabigatran) twice a dayprior to admission, now on ASA 325mg . Hold off [facility] for now given possible procedures.   Ongoing aggressive stroke risk factor management  Therapy recommendations: pending  Disposition: Pending  Aspiration pneumonia  Fever T-max 101.2  Leukocytosis WBC 22.2-15.5  CXR progressive bilateral pulmonary infiltrates  Tube feeding on hold  On Zosyn  CCM on board  Respiratory failure  Intubated on sedation  On ventilation  CCM on board  Atrial fibrillation, chronic  On Pradaxa PTA, compliant with medication  Currently on aspirin  Hold off anticoagulation given potential procedures  Hypotension History of hypertension  BP running low- Cleviprex-> phenyephrine  Avoid hypotension due to 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 as well as basal artery stenosis  BP goal 110-160  Avoid hypotension  Hyperlipidemia  Lipid lowering medication UJW:JXBJ  LDL81, goal < 70  Current lipid lowering medication:none (statin intolerant - abnormal labs)  Diabetes  HgbA1c7.1, goal < 7.0  Uncontrolled  SSI  CBG monitoring  On Levemir  Other Stroke Risk Factors  Advanced age  Former cigarette smoker - quit  Obesity,Body mass index is 47.79 kg/m., recommend weight loss, diet and exercise as appropriate   Hx stroke/TIA  PVD  Cardiomyopathy  Other Active Problems  Urethral stricture -on Foley  Hospital day # 3  This patient is critically ill due to left MCA and ACA infarct, bilateral ICA occlusions/stenosis, A. fib, respiratory failure, aspiration pneumonia, fever and at significant risk  of neurological worsening, death form recurrent stroke, hemorrhagic conversion, seizure, heart failure, septic shock, sepsis. This patient's care requires constant monitoring of vital signs, hemodynamics, respiratory and cardiac monitoring, review of multiple databases, neurological assessment, discussion with family, other specialists and medical decision making of high complexity. I spent 45 minutes of neurocritical care time in the care of this patient. I had long discussion with wife at bedside, updated pt current condition, treatment plan and potential prognosis. She expressed understanding and appreciation.    Marvel Plan, MD PhD Stroke Neurology 08/30/2018 10:54 AM    To contact Stroke Continuity provider, please refer to [email or web address]. After hours, contact General Neurology

## 2018-08-31 ENCOUNTER — Encounter (HOSPITAL_COMMUNITY): Payer: Self-pay | Admitting: Interventional Radiology

## 2018-08-31 ENCOUNTER — Inpatient Hospital Stay (HOSPITAL_COMMUNITY): Payer: Medicare HMO

## 2018-08-31 DIAGNOSIS — I4811 Longstanding persistent atrial fibrillation: Secondary | ICD-10-CM

## 2018-08-31 LAB — BASIC METABOLIC PANEL
Anion gap: 7 (ref 5–15)
BUN: 22 mg/dL (ref 8–23)
CO2: 21 mmol/L — ABNORMAL LOW (ref 22–32)
Calcium: 7.8 mg/dL — ABNORMAL LOW (ref 8.9–10.3)
Chloride: 114 mmol/L — ABNORMAL HIGH (ref 98–111)
Creatinine, Ser: 1.23 mg/dL (ref 0.61–1.24)
GFR calc Af Amer: >60 mL/min (ref >60)
GFR, EST NON AFRICAN AMERICAN: 56 mL/min — AB (ref >60)
Glucose, Bld: 146 mg/dL — ABNORMAL HIGH (ref 70–99)
Potassium: 4.1 mmol/L (ref 3.5–5.1)
SODIUM: 142 mmol/L (ref 135–145)

## 2018-08-31 LAB — CULTURE, RESPIRATORY W GRAM STAIN: Culture: NORMAL

## 2018-08-31 LAB — TROPONIN I: TROPONIN I: 12.47 ng/mL — AB (ref <0.03)

## 2018-08-31 LAB — CBC
HCT: 29.4 % — ABNORMAL LOW (ref 39.0–52.0)
Hemoglobin: 9.3 g/dL — ABNORMAL LOW (ref 13.0–17.0)
MCH: 33.1 pg (ref 26.0–34.0)
MCHC: 31.6 g/dL (ref 30.0–36.0)
MCV: 104.6 fL — ABNORMAL HIGH (ref 80.0–100.0)
NRBC: 0.3 % — AB (ref 0.0–0.2)
PLATELETS: 227 10*3/uL (ref 150–400)
RBC: 2.81 MIL/uL — ABNORMAL LOW (ref 4.22–5.81)
RDW: 15.6 % — ABNORMAL HIGH (ref 11.5–15.5)
WBC: 11.4 10*3/uL — ABNORMAL HIGH (ref 4.0–10.5)

## 2018-08-31 MED ORDER — METOPROLOL TARTRATE 5 MG/5ML IV SOLN
5.0000 mg | Freq: Four times a day (QID) | INTRAVENOUS | Status: DC | PRN
  Administered 2018-08-31 – 2018-09-01 (×3): 5 mg via INTRAVENOUS
  Filled 2018-08-31 (×6): qty 5

## 2018-08-31 MED ORDER — VITAL HIGH PROTEIN PO LIQD
1000.0000 mL | Freq: Four times a day (QID) | ORAL | Status: DC
  Administered 2018-08-31 (×2): 1000 mL

## 2018-08-31 MED ORDER — PRO-STAT SUGAR FREE PO LIQD
30.0000 mL | Freq: Two times a day (BID) | ORAL | Status: DC
  Administered 2018-08-31: 30 mL via ORAL
  Filled 2018-08-31 (×3): qty 30

## 2018-08-31 MED ORDER — FUROSEMIDE 10 MG/ML IJ SOLN
40.0000 mg | Freq: Once | INTRAMUSCULAR | Status: AC
  Administered 2018-08-31: 40 mg via INTRAVENOUS
  Filled 2018-08-31: qty 4

## 2018-08-31 NOTE — Progress Notes (Signed)
Progress Note  Patient Name: Darren Frazier Date of Encounter: 08/31/2018  Primary Cardiologist:   No primary care provider on file.   Subjective   Intubated and sedated.    Inpatient Medications    Scheduled Meds: . aspirin EC  325 mg Oral Daily  . chlorhexidine gluconate (MEDLINE KIT)  15 mL Mouth Rinse BID  . Chlorhexidine Gluconate Cloth  6 each Topical Q0600  . cholecalciferol  1,000 Units Per Tube Daily  . insulin aspart  0-20 Units Subcutaneous Q4H  . insulin detemir  10 Units Subcutaneous Daily  . levothyroxine  44 mcg Intravenous Daily  . mouth rinse  15 mL Mouth Rinse 10 times per day  . mupirocin ointment  1 application Nasal BID   Continuous Infusions: . sodium chloride 50 mL/hr at 08/31/18 1112  . dexmedetomidine (PRECEDEX) IV infusion 0.613 mcg/kg/hr (08/31/18 0847)  . famotidine (PEPCID) IV 20 mg (08/31/18 0850)  . fentaNYL infusion INTRAVENOUS 200 mcg/hr (08/31/18 0700)  . phenylephrine (NEO-SYNEPHRINE) Adult infusion 60 mcg/min (08/31/18 0700)  . piperacillin-tazobactam (ZOSYN)  IV 12.5 mL/hr at 08/31/18 0700   PRN Meds: acetaminophen **OR** acetaminophen (TYLENOL) oral liquid 160 mg/5 mL **OR** acetaminophen, fentaNYL, metoprolol tartrate, ondansetron (ZOFRAN) IV, polyvinyl alcohol, senna-docusate   Vital Signs      Vitals:   08/31/18 0900 08/31/18 1000 08/31/18 1054 08/31/18 1100  BP: 132/84 (!) 131/93    Pulse: (!) 125 (!) 116    Resp: (!) 22 (!) 21    Temp: 98.9 F (37.2 C)   99.2 F (37.3 C)  TempSrc: Oral   Oral  SpO2: 98% 93% 96%   Weight:      Height:       Physical Exam   GEN:  intubated Neck: No  JVD Cardiac: Irregular RR, no murmurs, rubs, or gallops.  Respiratory:    Decreased breath sounds GI:     Decreased bowel sounds MS:  Diffuse moderate  edema; No deformity. Neuro:   Intubated and sedated.     Intake/Output Summary (Last 24 hours) at 08/31/2018 1212 Last data filed at 08/31/2018 0700 Gross per 24 hour    Intake 2734.85 ml  Output 980 ml  Net 1754.85 ml   Filed Weights   08/29/18 0436 08/30/18 0412 08/31/18 0500  Weight: (!) 142.4 kg (!) 143.5 kg (!) 143.5 kg    Telemetry    Atrial fib rate controlled.  - Personally Reviewed  ECG    NA - Personally Reviewed  Labs    Chemistry Recent Labs  Lab 08/29/18 0416 08/30/18 0655 08/31/18 1027  NA 136 137 142  K 4.1 4.1 4.1  CL 103 107 114*  CO2 21* 19* 21*  GLUCOSE 232* 191* 146*  BUN _0 CREATININE 1.23 1.18 1.23  CALCIUM 7.9* 7.6* 7.8*  GFRNONAA 56* 59* 56*  GFRAA >60 >60 >60  ANIONGAP _1 Hematology Recent Labs  Lab 08/29/18 1734 08/30/18 0545 08/31/18 0517  WBC 22.2* 15.5* 11.4*  RBC 3.11* 3.15* 2.81*  HGB 10.4* 10.3* 9.3*  HCT 32.8* 31.9* 29.4*  MCV 105.5* 101.3* 104.6*  MCH 33.4 32.7 33.1  MCHC 31.7 32.3 31.6  RDW 14.9 14.5 15.6*  PLT 233 251 227    Cardiac Enzymes Recent Labs  Lab 08/29/18 0353 08/29/18 0708 08/29/18 1201  TROPONINI 3.59* 6.39* 15.82*   No results for input(s): TROPIPOC in the last 168 hours.   BNPNo results for input(s): BNP, PROBNP in the  last 168 hours.   DDimer No results for input(s): DDIMER in the last 168 hours.   Radiology    Ct Abdomen Pelvis W Contrast  Result Date: 08/29/2018 CLINICAL DATA:  Abdominal distention.  Bloating. EXAM: CT ABDOMEN AND PELVIS WITH CONTRAST TECHNIQUE: Multidetector CT imaging of the abdomen and pelvis was performed using the standard protocol following bolus administration of intravenous contrast. CONTRAST:  123m OMNIPAQUE IOHEXOL 300 MG/ML  SOLN COMPARISON:  Abdominal radiograph dated 08/28/2018 and abdominal ultrasound dated 02/28/2016 FINDINGS: Lower chest: There are bilateral consolidative infiltrates in the lower lobes as well as minimal bilateral pleural effusions. Aortic atherosclerosis. Coronary artery calcifications. Hepatobiliary: Liver parenchyma is normal. No biliary ductal dilatation. Vicarious excretion of contrast  in the normal appearing gallbladder. Pancreas: Unremarkable. No pancreatic ductal dilatation or surrounding inflammatory changes. Spleen: Normal in size without focal abnormality. Adrenals/Urinary Tract: Adrenal glands are normal. Normal right kidney. Large chronic cyst in the lower pole of the left kidney with some calcification in the wall. Maximum diameter of the cyst is approximately 9 cm. Stomach/Bowel: NG tube tip in the body of the stomach. There are few diverticula in the proximal sigmoid portion of the colon. There is moderate air in the colon without significant distention. Small bowel appears normal. Fluid and air in the stomach. Appendix has been removed. Vascular/Lymphatic: There is a poorly defined hematoma in the right inguinal region in the panniculus. This measures approximately 12 x 5 x 2 cm and is probably related to recent angiography. There are several small lymph nodes in the right inguinal region, the largest being 15 mm in diameter. Extensive aortic atherosclerosis. No adenopathy. Reproductive: Dense calcification in the normal-sized prostate gland. Other: There is a small amount of ascites in the abdomen, nonspecific. Small periumbilical hernia containing only fat. Edema in the subcutaneous fat of both flanks, right greater than left. Slight edema in the panniculus and in the lateral aspect of both thighs. Musculoskeletal: No acute abnormalities. Multilevel degenerative disc disease in the thoracic spine and lumbar spine. IMPRESSION: 1. Small hematoma in the right inguinal region in the panniculus, probably secondary to the recent angiography. 2. Bilateral consolidative infiltrates in the posterior aspect of both lower lobes with tiny adjacent pleural effusions. 3. Small amount of nonspecific ascites. 4. Slightly prominent gas in the bowel without evidence of bowel obstruction. This probably represents an ileus. Electronically Signed   By: JLorriane ShireM.D.   On: 08/29/2018 17:03   Dg  Chest Port 1 View  Result Date: 08/31/2018 CLINICAL DATA:  CXR for Respiratory failure. Hx of HTN, stroke, AAA, Diabetes, and Cardiomyopathy. EXAM: PORTABLE CHEST 1 VIEW COMPARISON:  08/30/2018 FINDINGS: Endotracheal tube is in place, tip 3.8 centimeters above the carina. Nasogastric tube is in place, tip beyond the gastroesophageal junction and off the image. A RIGHT IJ central line tip overlies the superior vena cava. Patient is rotated towards the LEFT. The heart is enlarged. There is persistent airspace filling opacity throughout the RIGHT lung, slightly improved. There is significant opacity in the LEFT lung base, consistent with atelectasis or consolidation and increased since 08/27/2018. IMPRESSION: 1. Slight improvement in aeration of the RIGHT lung. 2. Increased opacity in the LEFT lung base. Electronically Signed   By: ENolon NationsM.D.   On: 08/31/2018 09:52   Dg Chest Port 1 View  Result Date: 08/30/2018 CLINICAL DATA:  Respiratory failure. Cardiomyopathy. EXAM: PORTABLE CHEST 1 VIEW COMPARISON:  08/29/2018 and 08/27/2018 FINDINGS: Endotracheal tube and NG tube and central line appear  unchanged and in good position. There has been progression of the bilateral diffuse pulmonary infiltrates, particularly in the right upper lung zone of the left lung base. Heart size and pulmonary vascularity are normal. No discrete effusions. Aortic atherosclerosis. No acute bone abnormality. IMPRESSION: Progressive bilateral pulmonary infiltrates. Aortic Atherosclerosis (ICD10-I70.0). Electronically Signed   By: Lorriane Shire M.D.   On: 08/30/2018 08:48    Cardiac Studies   Study Conclusions  - Left ventricle: The cavity size was normal. Systolic function was   normal. The estimated ejection fraction was in the range of 60%   to 65%. Images were inadequate for LV wall motion assessment. The   study is not technically sufficient to allow evaluation of LV   diastolic function. - Aortic valve: Valve  mobility was moderately restricted.   Suboptimal image quality limited Doppler assessment of aortic   valve systolic gradient. There was no regurgitation. - Mitral valve: Moderately calcified annulus. There was trivial   regurgitation. - Left atrium: The atrium was moderately dilated. - Tricuspid valve: There was trivial regurgitation.   Patient Profile     77 y.o. 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 We are involved for management of his atrial fib.   Assessment & Plan    Atrial fib:    No anticoagulation secondary to .  No anticoagulation with evidence of petechial hemorrhage and groin bleeding.   Rate is relatively well controlled unless he is agitated.     Elevated Troponin:  Not a candidate for other therapy.  I will repeat a level to see if it is trending down.   EF normal as above.     For questions or updates, please contact [place] Please consult [email or web address] for contact info under Cardiology/STEMI.   Signed, Minus Breeding, MD  08/31/2018, 12:12 PM

## 2018-08-31 NOTE — Progress Notes (Signed)
NAME:  Darren Frazier, MRN:  [id number], DOB:  April 12, 1941, LOS: 4 ADMISSION DATE:  08/27/2018, CONSULTATION DATE: 08/27/2018 REFERRING MD:  Dr. Corliss Skains, CHIEF COMPLAINT:  Rt leg weakness   Brief History   77 yo male presented to [facility] with Rt leg numbness and weakness.  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  He was transferred to [facility].  He was intubated for neuro-IR intervention.  Past Medical History  A fib on pradaxa, HTN, Urethral stricture, cardiomyopathy, PAD, HLD, CVA, lymph edema, Vitamin D deficiency, Neuropathy, Hypothyroidism, DM  Significant Hospital Events   12/25 Admit, arteriogram by IR 12/26 A fib with RVR elevated troponin, progressive anemia, hypotension requiring pressors 12/27 Transfuse 1 unit PRBC, change to pressure control 12/28 vomiting, tube feeds held  Consults:  Neurology Neuro IR Cardiology  Procedures:  ETT 12/25 >>  Rt IJ CVL 12/27 >>  Significant Diagnostic Tests:  CT angio head/neck 12/25 >> occlusion of Lt ICA Echo 12/26 >> EF 60 to 65% CT abd/pelvis 12/27 >> small hematoma Rt inguinal region, b/l lower lung AD, ileus  Micro Data:  Sputum 12/26 >>   Antimicrobials:  Zosyn 12/27 >>   Interim history/subjective:  Tm 101.2.  Remains on full vent support, sedation.  Objective   Blood pressure 122/64, pulse 71, temperature 100 F (37.8 C), temperature source Axillary, resp. rate 20, height 5\' 9"  (1.753 m), weight (!) 143.5 kg, SpO2 100 %.    Vent Mode: PCV FiO2 (%):  [40 %-50 %] 40 % Set Rate:  [20 bmp] 20 bmp PEEP:  [8 cmH20] 8 cmH20 Plateau Pressure:  [27 cmH20-35 cmH20] 31 cmH20   Intake/Output Summary (Last 24 hours) at 08/31/2018 0747 Last data filed at 08/31/2018 0700 Gross per 24 hour  Intake 3586.06 ml  Output 1330 ml  Net 2256.06 ml   Filed Weights   08/29/18 0436 08/30/18 0412 08/31/18 0500  Weight: (!) 142.4 kg (!) 143.5 kg (!) 143.5 kg    Examination:  General - sedated Eyes - pupils pinpoint ENT - ETT in  place Cardiac - regular rate/rhythm, no murmur Chest - basilar rales, no wheeze Abdomen - soft, non tender, less distended, + bowel sounds GU - no lesions noted Extremities - 1+ non pitting edema Skin - no rashes Neuro - RASS -3  CXR 12/29 (reviewed by me) >> bl ASD      Resolved Hospital Problem list     Assessment & Plan:   Acute respiratory failure with compromised airway in setting of CVA. Plan - cycle on pressure support - no ready for extubation - f/u CXR  Aspiration pneumonia. Plan - Day 3 of zosyn  Ischemic CVA from Lt ICA occlusion. Plan - ASA per neurology  Acute blood loss anemia from cath site bleeding. Plan - f/u CBC - transfuse for Hb < 7 - check iron levels  A fib with RVR. Hx of HTN, HLD, elevated troponin. Plan - anticoagulation per cardiology and neurology - goal SBP 110 to 180 per neurology - hold outpt norvasc, zebata, HCTZ, cozaar   Ileus. Plan - improved 12/29 - resume tube feeds as tolerated  DM type II with peripheral neuropathy. Hypothyroidism. Plan - SSI with levemir - continue synthroid - hold outpt metformin, neurontin  Possible contrast extravasation in Rt arm 12/27. Plan - monitor clinically   Best practice:  Diet: tube feeds DVT prophylaxis: SCDs GI prophylaxis: Protonix Mobility: Bed rest Code Status: Full code Family Communication: updated wife at bedside on 12/28   Labs  CMP Latest Ref Rng & Units 08/30/2018 08/29/2018 08/28/2018  Glucose 70 - 99 mg/dL 960(A191(H) 540(J232(H) 811(B140(H)  BUN 8 - 23 mg/dL 18 11 12   Creatinine 0.61 - 1.24 mg/dL 1.471.18 8.291.23 5.620.98  Sodium 135 - 145 mmol/L 137 136 138  Potassium 3.5 - 5.1 mmol/L 4.1 4.1 4.0  Chloride 98 - 111 mmol/L 107 103 104  CO2 22 - 32 mmol/L 19(L) 21(L) 25  Calcium 8.9 - 10.3 mg/dL 7.6(L) 7.9(L) 7.6(L)  Total Protein 6.4 - 8.2 g/dL - - -  Total Bilirubin 0.2 - 1.0 mg/dL - - -  Alkaline Phos 50 - 136 Unit/L - - -  AST 15 - 37 Unit/L - - -  ALT 12 - 78 U/L - - -    CBC Latest Ref Rng & Units 08/31/2018 08/30/2018 08/29/2018  WBC 4.0 - 10.5 K/uL 11.4(H) 15.5(H) 22.2(H)  Hemoglobin 13.0 - 17.0 g/dL 1.3(Y9.3(L) 10.3(L) 10.4(L)  Hematocrit 39.0 - 52.0 % 29.4(L) 31.9(L) 32.8(L)  Platelets 150 - 400 K/uL 227 251 233   ABG    Component Value Date/Time   PHART 7.364 08/27/2018 2238   PCO2ART 44.7 08/27/2018 2238   PO2ART 99.0 08/27/2018 2238   HCO3 25.5 08/27/2018 2238   TCO2 27 08/27/2018 2238   O2SAT 97.0 08/27/2018 2238   CBG (last 3)  Recent Labs    08/29/18 0318 08/29/18 0442 08/29/18 0840  GLUCAP 253* 229* 222*   CC time 32 minutes  Coralyn HellingVineet Haneen Bernales, MD [facility] 08/31/2018, 7:47 AM

## 2018-08-31 NOTE — Progress Notes (Signed)
STROKE TEAM PROGRESS NOTE   SUBJECTIVE (INTERVAL HISTORY) His RN is at the bedside.  Pt still on sedation and intubated. With less sedation, as per RN, pt can move left UE and LE spontaneously, but not following commands. Still has fever overnight but this am 98.8. CXR slight improvement.    OBJECTIVE Temp:  [98.8 F (37.1 C)-101.2 F (38.4 C)] 100 F (37.8 C) (12/29 0357) Pulse Rate:  [40-115] 71 (12/29 0715) Cardiac Rhythm: Atrial fibrillation (12/29 0800) Resp:  [16-32] 20 (12/29 0715) BP: (99-153)/(52-99) 122/64 (12/29 0715) SpO2:  [95 %-100 %] 100 % (12/29 0715) FiO2 (%):  [40 %-50 %] 40 % (12/29 0442) Weight:  [143.5 kg] 143.5 kg (12/29 0500)  Recent Labs  Lab 08/28/18 1959 08/28/18 2306 08/29/18 0318 08/29/18 0442 08/29/18 0840  GLUCAP 182* 198* 253* 229* 222*   Recent Labs  Lab 08/27/18 1122 08/28/18 0600 08/28/18 0921 08/28/18 1826 08/29/18 0416 08/29/18 1905 08/30/18 0655  NA 136 138  --   --  136  --  137  K 3.8 4.0  --   --  4.1  --  4.1  CL 99 104  --   --  103  --  107  CO2 25 25  --   --  21*  --  19*  GLUCOSE 190* 140*  --   --  232*  --  191*  BUN 21 12  --   --  11  --  18  CREATININE 1.02 0.98  --   --  1.23  --  1.18  CALCIUM 9.4 7.6*  --   --  7.9*  --  7.6*  MG  --   --  1.5* 1.5* 1.6* 1.7  --   PHOS  --   --  3.4 3.3 3.2 2.2*  --    No results for input(s): AST, ALT, ALKPHOS, BILITOT, PROT, ALBUMIN in the last 168 hours. Recent Labs  Lab 08/28/18 0600 08/28/18 1053 08/29/18 0416 08/29/18 1734 08/30/18 0545 08/31/18 0517  WBC 16.1* 16.6* 14.2* 22.2* 15.5* 11.4*  NEUTROABS 12.4*  --   --   --   --   --   HGB 9.2* 9.4* 8.7* 10.4* 10.3* 9.3*  HCT 28.6* 29.6* 26.9* 32.8* 31.9* 29.4*  MCV 99.0 99.3 101.1* 105.5* 101.3* 104.6*  PLT 229 240 165 233 251 227   Recent Labs  Lab 08/29/18 0353 08/29/18 0708 08/29/18 1201  TROPONINI 3.59* 6.39* 15.82*   No results for input(s): LABPROT, INR in the last 72 hours. No results for input(s):  COLORURINE, LABSPEC, PHURINE, GLUCOSEU, HGBUR, BILIRUBINUR, KETONESUR, PROTEINUR, UROBILINOGEN, NITRITE, LEUKOCYTESUR in the last 72 hours.  Invalid input(s): APPERANCEUR     Component Value Date/Time   CHOL 138 08/28/2018 0600   TRIG 171 (H) 08/28/2018 0921   HDL 24 (L) 08/28/2018 0600   CHOLHDL 5.8 08/28/2018 0600   VLDL 33 08/28/2018 0600   LDLCALC 81 08/28/2018 0600   Lab Results  Component Value Date   HGBA1C 7.1 (H) 08/28/2018   No results found for: LABOPIA, COCAINSCRNUR, LABBENZ, AMPHETMU, THCU, LABBARB  No results for input(s): ETH in the last 168 hours.  I have personally reviewed the radiological images below and agree with the radiology interpretations.  Ct Angio Head W Or Wo Contrast  Result Date: 08/27/2018 CLINICAL DATA:  Acute presentation with speech disturbance. EXAM: CT ANGIOGRAPHY HEAD AND NECK TECHNIQUE: Multidetector CT imaging of the head and neck was performed using the standard protocol during bolus administration  of intravenous contrast. Multiplanar CT image reconstructions and MIPs were obtained to evaluate the vascular anatomy. Carotid stenosis measurements (when applicable) are obtained utilizing NASCET criteria, using the distal internal carotid diameter as the denominator. CONTRAST:  75mL ISOVUE-370 IOPAMIDOL (ISOVUE-370) INJECTION 76% COMPARISON:  CT same day. FINDINGS: CTA NECK FINDINGS Aortic arch: Aortic atherosclerosis.  No aneurysm or dissection. Right carotid system: Common carotid artery shows some atherosclerotic plaque but is widely patent to the bifurcation. There is soft and calcified plaque affecting the carotid bifurcation and ICA bulb region. There is severe stenosis in the distal bulb region with luminal diameter of 1 mm. The vessel is quite tortuous beyond that but widely patent to the upper cervical region, where there is soft and calcified plaque resulting in minimal diameter of 3.5 mm. Compared to an expected diameter of 5 mm, stenosis in the  bulb is 80% or greater. Stenosis in the upper cervical region is 30%. Left carotid system: Common carotid artery shows atherosclerotic plaque but is sufficiently patent to the bifurcation region. There is calcified and soft plaque at the carotid bifurcation and ICA bulb. There is left internal carotid artery occlusion at the distal bulb. 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 The left vertebral artery shows 50% stenosis at its origin but is sufficiently patent beyond that through the cervical region to the foramen magnum. Skeleton: Mid cervical spondylosis. Other neck: No mass or lymphadenopathy. Upper chest: Interstitial prominence which could go along with fluid overload or mild edema. Review of the MIP images confirms the above findings CTA HEAD FINDINGS Anterior circulation: Left internal carotid artery is occluded without antegrade flow through the skull base. Right internal carotid artery shows atherosclerotic disease in the carotid siphon region with stenosis estimated at 50-70%. The anterior and middle cerebral vessels are patent. There is atherosclerotic irregularity of the M1 segment with stenosis of 50-70%. No acute vessel occlusion is identified. There is a chronic punctate calcified embolus in 1 of the insular branches, but this was present in 2014. On the left, there is reconstituted flow in the distal siphon. Severe stenosis of the supraclinoid ICA, 80% or greater. Flow is present in the anterior and middle cerebral vessels, secondary to reconstituted flow and flow through communicating arteries. There is 50% stenosis in the M1 segment. I do not see any occluded or missing large or medium vessels in the MCA territory. Posterior circulation: Right vertebral artery shows no antegrade flow at the foramen magnum. There is retrograde flow in the distal right vertebral. Left vertebral artery is patent at the foramen magnum. There is atherosclerotic disease of the V4  segment with stenosis estimated at 70%. There are serial stenoses. The vessel does show flow to the basilar, which shows atherosclerotic irregularity but is patent. Flow is present in the superior cerebellar and posterior cerebral arteries. Venous sinuses: Patent and normal. Anatomic variants: None significant. Delayed phase: No abnormal enhancement. 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 Severe irregular atherosclerotic disease of the right carotid bifurcation with stenosis of 80% in the bulb region and 30% in the distal ICA. 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 50% stenosis of the left vertebral artery origin. Severe atherosclerotic disease in both carotid siphon regions. Right siphon stenosis estimated at 50-70%. Reconstituted flow in the distal left siphon and supraclinoid region. 80% supraclinoid stenosis. Flow in both anterior and middle cerebral artery territories. Stenosis of both M1  segments estimated at 50-70%. I do not identify any large or medium vessel occlusion acutely within the MCA branch vessels. Severely disease left V4 segment with serial stenoses proximal to the basilar estimated at 70%. Basilar atherosclerotic irregularity without flow limiting stenosis. Posterior circulation branch vessels do show flow, including right PICA which is supplied by retrograde flow in the distal right vertebral artery. Electronically Signed   By: Paulina Fusi M.D.   On: 08/27/2018 13:14   Dg Chest 2 View  Result Date: 08/27/2018 CLINICAL DATA:  Chest pain and shortness of breath.  Cardiomyopathy. EXAM: CHEST - 2 VIEW COMPARISON:  03/07/2013 FINDINGS: Mild cardiomegaly. Aortic atherosclerosis. Diffuse interstitial infiltrates, consistent with pulmonary edema. Mild subsegmental atelectasis also seen in the left upper lobe. No evidence of pulmonary consolidation or significant pleural effusion. IMPRESSION: Mild  cardiomegaly and diffuse interstitial edema, consistent with congestive heart failure. Electronically Signed   By: Myles Rosenthal M.D.   On: 08/27/2018 12:02   Ct Angio Neck W Or Wo Contrast  Result Date: 08/27/2018 CLINICAL DATA:  Acute presentation with speech disturbance. EXAM: CT ANGIOGRAPHY HEAD AND NECK TECHNIQUE: Multidetector CT imaging of the head and neck was performed using the standard protocol during bolus administration of intravenous contrast. Multiplanar CT image reconstructions and MIPs were obtained to evaluate the vascular anatomy. Carotid stenosis measurements (when applicable) are obtained utilizing NASCET criteria, using the distal internal carotid diameter as the denominator. CONTRAST:  75mL ISOVUE-370 IOPAMIDOL (ISOVUE-370) INJECTION 76% COMPARISON:  CT same day. FINDINGS: CTA NECK FINDINGS Aortic arch: Aortic atherosclerosis.  No aneurysm or dissection. Right carotid system: Common carotid artery shows some atherosclerotic plaque but is widely patent to the bifurcation. There is soft and calcified plaque affecting the carotid bifurcation and ICA bulb region. There is severe stenosis in the distal bulb region with luminal diameter of 1 mm. The vessel is quite tortuous beyond that but widely patent to the upper cervical region, where there is soft and calcified plaque resulting in minimal diameter of 3.5 mm. Compared to an expected diameter of 5 mm, stenosis in the bulb is 80% or greater. Stenosis in the upper cervical region is 30%. Left carotid system: Common carotid artery shows atherosclerotic plaque but is sufficiently patent to the bifurcation region. There is calcified and soft plaque at the carotid bifurcation and ICA bulb. There is left internal carotid artery occlusion at the distal bulb. 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 The left vertebral artery shows 50% stenosis at its origin but is sufficiently patent beyond that through the cervical  region to the foramen magnum. Skeleton: Mid cervical spondylosis. Other neck: No mass or lymphadenopathy. Upper chest: Interstitial prominence which could go along with fluid overload or mild edema. Review of the MIP images confirms the above findings CTA HEAD FINDINGS Anterior circulation: Left internal carotid artery is occluded without antegrade flow through the skull base. Right internal carotid artery shows atherosclerotic disease in the carotid siphon region with stenosis estimated at 50-70%. The anterior and middle cerebral vessels are patent. There is atherosclerotic irregularity of the M1 segment with stenosis of 50-70%. No acute vessel occlusion is identified. There is a chronic punctate calcified embolus in 1 of the insular branches, but this was present in 2014. On the left, there is reconstituted flow in the distal siphon. Severe stenosis of the supraclinoid ICA, 80% or greater. Flow is present in the anterior and middle cerebral vessels, secondary to reconstituted flow and flow through communicating arteries. There  is 50% stenosis in the M1 segment. I do not see any occluded or missing large or medium vessels in the MCA territory. Posterior circulation: Right vertebral artery shows no antegrade flow at the foramen magnum. There is retrograde flow in the distal right vertebral. Left vertebral artery is patent at the foramen magnum. There is atherosclerotic disease of the V4 segment with stenosis estimated at 70%. There are serial stenoses. The vessel does show flow to the basilar, which shows atherosclerotic irregularity but is patent. Flow is present in the superior cerebellar and posterior cerebral arteries. Venous sinuses: Patent and normal. Anatomic variants: None significant. Delayed phase: No abnormal enhancement. 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 Severe irregular atherosclerotic disease of the right  carotid bifurcation with stenosis of 80% in the bulb region and 30% in the distal ICA. 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 50% stenosis of the left vertebral artery origin. Severe atherosclerotic disease in both carotid siphon regions. Right siphon stenosis estimated at 50-70%. Reconstituted flow in the distal left siphon and supraclinoid region. 80% supraclinoid stenosis. Flow in both anterior and middle cerebral artery territories. Stenosis of both M1 segments estimated at 50-70%. I do not identify any large or medium vessel occlusion acutely within the MCA branch vessels. Severely disease left V4 segment with serial stenoses proximal to the basilar estimated at 70%. Basilar atherosclerotic irregularity without flow limiting stenosis. Posterior circulation branch vessels do show flow, including right PICA which is supplied by retrograde flow in the distal right vertebral artery. Electronically Signed   By: Paulina Fusi M.D.   On: 08/27/2018 13:14   Mr Brain Wo Contrast  Result Date: 08/28/2018 CLINICAL DATA:  Follow-up examination for acute stroke, right lower extremity weakness. Known left ICA occlusion. EXAM: MRI HEAD WITHOUT CONTRAST TECHNIQUE: Multiplanar, multiecho pulse sequences of the brain and surrounding structures were obtained without intravenous contrast. COMPARISON:  Prior CTs from 08/27/2018. FINDINGS: Brain: Generalized age-related cerebral atrophy. Patchy and confluent T2/FLAIR hyperintensity within the periventricular and deep white matter both cerebral hemispheres most consistent with chronic microvascular ischemic disease, moderate nature. Scatter remote cortical infarcts involving the posterior left frontoparietal region, right parietal lobe, and right frontal lobe are seen. Small remote right cerebellar infarct. Remote lacunar infarct present at the left caudate head. Scattered chronic hemosiderin staining seen about several of these infarcts. Scattered multifocal foci of  restricted diffusion involving the cortical subcortical left frontal, parietal, and temporal occipital regions, compatible with acute ischemic left MCA territory infarcts. Largest area of infarction seen at the high left frontal parietal region and measures 2.5 cm (series 3, image 48). Possible faint petechial hemorrhage about a few of these infarcts without frank hemorrhagic transformation or significant mass effect. Findings likely embolic in nature. No mass lesion, midline shift or mass effect. Mild diffuse ventricular prominence related global parenchymal volume loss without hydrocephalus. No extra-axial fluid collection. Pituitary gland within normal limits. Vascular: Abnormal flow void within the left ICA to its cavernous segment, compatible with previously identified left ICA occlusion. Major intracranial vascular flow voids otherwise maintained at the skull base. Skull and upper cervical spine: Craniocervical junction within normal limits. Upper cervical spine normal. Bone marrow signal intensity within normal limits. No scalp soft tissue abnormality. Sinuses/Orbits: Patient status post bilateral ocular lens replacement. Globes and orbital soft tissues demonstrate no acute finding. Scattered mucosal thickening seen throughout the paranasal sinuses with superimposed scattered air-fluid levels. Fluid seen within  the nasopharynx. Patient likely intubated. Trace bilateral mastoid effusions noted. Inner ear structures normal. Other: None. IMPRESSION: 1. 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 Possible faint scattered associated petechial hemorrhage without frank hemorrhagic transformation or significant mass effect. 2. Abnormal flow void within the left ICA to its cavernous segment, consistent with known left ICA occlusion. 3. 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 4.  Moderately advanced cerebral atrophy with chronic small vessel ischemic disease. Electronically Signed   By: Rise Mu M.D.   On: 08/28/2018 15:33   Ct Abdomen Pelvis W Contrast  Result Date: 08/29/2018 CLINICAL DATA:  Abdominal distention.  Bloating. EXAM: CT ABDOMEN AND PELVIS WITH CONTRAST TECHNIQUE: Multidetector CT imaging of the abdomen and pelvis was performed using the standard protocol following bolus administration of intravenous contrast. CONTRAST:  OMNIPAQUE IOHEXOL 300 MG/ML  SOLN COMPARISON:  Abdominal radiograph dated 08/28/2018 and abdominal ultrasound dated 02/28/2016 FINDINGS: Lower chest: There are bilateral consolidative infiltrates in the lower lobes as well as minimal bilateral pleural effusions. Aortic atherosclerosis. Coronary artery calcifications. Hepatobiliary: Liver parenchyma is normal. No biliary ductal dilatation. Vicarious excretion of contrast in the normal appearing gallbladder. Pancreas: Unremarkable. No pancreatic ductal dilatation or surrounding inflammatory changes. Spleen: Normal in size without focal abnormality. Adrenals/Urinary Tract: Adrenal glands are normal. Normal right kidney. Large chronic cyst in the lower pole of the left kidney with some calcification in the wall. Maximum diameter of the cyst is approximately 9 cm. Stomach/Bowel: NG tube tip in the body of the stomach. There are few diverticula in the proximal sigmoid portion of the colon. There is moderate air in the colon without significant distention. Small bowel appears normal. Fluid and air in the stomach. Appendix has been removed. Vascular/Lymphatic: There is a poorly defined hematoma in the right inguinal region in the panniculus. This measures approximately 12 x 5 x 2 cm and is probably related to recent angiography. There are several small lymph nodes in the right inguinal region, the largest being 15 mm in diameter. Extensive aortic atherosclerosis. No adenopathy. Reproductive:  Dense calcification in the normal-sized prostate gland. Other: There is a small amount of ascites in the abdomen, nonspecific. Small periumbilical hernia containing only fat. Edema in the subcutaneous fat of both flanks, right greater than left. Slight edema in the panniculus and in the lateral aspect of both thighs. Musculoskeletal: No acute abnormalities. Multilevel degenerative disc disease in the thoracic spine and lumbar spine. IMPRESSION: 1. Small hematoma in the right inguinal region in the panniculus, probably secondary to the recent angiography. 2. Bilateral consolidative infiltrates in the posterior aspect of both lower lobes with tiny adjacent pleural effusions. 3. Small amount of nonspecific ascites. 4. Slightly prominent gas in the bowel without evidence of bowel obstruction. This probably represents an ileus. Electronically Signed   By: Francene Boyers M.D.   On: 08/29/2018 17:03   Ct Cerebral Perfusion W Contrast  Result Date: 08/27/2018 CLINICAL DATA:  Right-sided weakness onset this morning. EXAM: CT PERFUSION BRAIN TECHNIQUE: Multiphase CT imaging of the brain was performed following IV bolus contrast injection. Subsequent parametric perfusion maps were calculated using RAPID software. CONTRAST:  40mL ISOVUE-370 IOPAMIDOL (ISOVUE-370) INJECTION 76% COMPARISON:  CT a head today FINDINGS: CT Brain Perfusion Findings: CBF (<30%) Volume: 0mL Perfusion (Tmax>6.0s) volume: Mismatch Volume: ASPECTS on noncontrast CT Head today, not calculated due to extensive chronic ischemic change. No definite acute infarct on CT.  Infarct Core: 0 mL Infarction Location:Delayed perfusion throughout the left MCA territory with apparent sparing of the basal ganglia. There is occlusion of the left internal carotid artery on CTA, age indeterminate. Left middle cerebral artery is diseased and may have delayed perfusion due to collateral circulation and stenosis. IMPRESSION: 155 mL of delayed perfusion in the  left MCA territory. No core infarction. Delayed perfusion left MCA territory may be due to severe intracranial atherosclerotic disease as well as occlusion of the left internal carotid artery. Age of the internal carotid artery occlusion is indeterminate. It is possible this delayed perfusion is a chronic or possibly acute finding. Electronically Signed   By: Marlan Palau M.D.   On: 08/27/2018 16:12   Dg Chest Port 1 View  Result Date: 08/30/2018 CLINICAL DATA:  Respiratory failure. Cardiomyopathy. EXAM: PORTABLE CHEST 1 VIEW COMPARISON:  08/29/2018 and 08/27/2018 FINDINGS: Endotracheal tube and NG tube and central line appear unchanged and in good position. There has been progression of the bilateral diffuse pulmonary infiltrates, particularly in the right upper lung zone of the left lung base. Heart size and pulmonary vascularity are normal. No discrete effusions. Aortic atherosclerosis. No acute bone abnormality. IMPRESSION: Progressive bilateral pulmonary infiltrates. Aortic Atherosclerosis (ICD10-I70.0). Electronically Signed   By: Francene Boyers M.D.   On: 08/30/2018 08:48   Dg Chest Port 1 View  Result Date: 08/29/2018 CLINICAL DATA:  Central line placement EXAM: PORTABLE CHEST 1 VIEW COMPARISON:  08/29/2018 FINDINGS: Right central line is been placed with the tip in the SVC. No pneumothorax. Endotracheal tube and NG tube are unchanged. Cardiomegaly with vascular congestion and mild pulmonary edema, stable. IMPRESSION: Right central line tip in the SVC. No pneumothorax. Continued mild CHF. Electronically Signed   By: Charlett Nose M.D.   On: 08/29/2018 11:58   Dg Chest Port 1 View  Result Date: 08/29/2018 CLINICAL DATA:  PT presented to [facility] ED on 08/27/18 with complaints of right lower extremity weakness, dyspnea and chest pressure. Hx of stroke, Peripheral vascualar disease, cardiomyopathy, and A-fib. EXAM: PORTABLE CHEST 1 VIEW COMPARISON:  08/27/2018 FINDINGS: Mild enlargement of the  cardiac silhouette, stable. No mediastinal or hilar masses. There is central vascular congestion. Mild hazy opacity noted in the perihilar regions and medial lung bases bilaterally. No convincing pleural effusion and no pneumothorax. Endotracheal tube tip projects 3.8 cm above the Carina. Nasogastric tube passes below the diaphragm well into the stomach. IMPRESSION: 1. Findings are consistent with mild congestive heart failure with evidence of slight improvement when compared to 08/27/2018. No new abnormalities. 2. Support apparatus is well positioned. Electronically Signed   By: Amie Portland M.D.   On: 08/29/2018 07:26   Dg Chest Port 1 View  Result Date: 08/28/2018 CLINICAL DATA:  Shortness of Breath EXAM: PORTABLE CHEST 1 VIEW COMPARISON:  08/27/2018 FINDINGS: Cardiac shadow is enlarged in size. Aortic calcifications are noted. Endotracheal tube is noted at the level of the carina. This should be withdrawn 2-3 cm. Nasogastric catheter is noted within the stomach. Lungs are well aerated bilaterally with mild interstitial edema similar to that seen on the prior exam. IMPRESSION: Interval intubation with the endotracheal tube at the level of the carina. This should be withdrawn 2-3 cm. Stable edema bilaterally. These results will be called to the ordering clinician or representative by the Radiologist Assistant, and communication documented in the PACS or zVision Dashboard. Electronically Signed   By: Alcide Clever M.D.   On: 08/28/2018 00:19   Dg Abd Portable 1v  Result Date: 08/28/2018 CLINICAL DATA:  Orogastric tube placement. EXAM: PORTABLE ABDOMEN - 1 VIEW COMPARISON:  None. FINDINGS: The patient's enteric tube is seen ending overlying the body of the stomach, with the side port about the fundus of the stomach. The visualized bowel gas pattern is grossly unremarkable. A small left pleural effusion is noted. Increased interstitial markings raise concern for pulmonary edema. Vascular congestion is  noted. The patient's endotracheal tube is seen ending 2-3 cm above the carina. No acute osseous abnormalities are identified. IMPRESSION: 1. Enteric tube seen ending overlying the body of the stomach, with the side port about the fundus of the stomach. 2. Small left pleural effusion noted. Increased interstitial markings raise concern for pulmonary edema. Electronically Signed   By: Roanna Raider M.D.   On: 08/28/2018 22:08   Ct Head Code Stroke Wo Contrast`  Result Date: 08/27/2018 CLINICAL DATA:  Code stroke. Slurred speech beginning 3 weeks ago. Right-sided weakness beginning today. EXAM: CT HEAD WITHOUT CONTRAST TECHNIQUE: Contiguous axial images were obtained from the base of the skull through the vertex without intravenous contrast. COMPARISON:  CT 03/07/2013 FINDINGS: Brain: The brainstem and cerebellum appear normal by CT. Cerebral hemispheres show age related volume loss. There are old cortical and subcortical infarctions in the right frontal lobe, left parietal vertex, left frontal lobe, and left frontoparietal vertex. The show atrophy and encephalomalacia with gliosis. There is no finding of acute or subacute infarction on this CT. There is old lacunar infarction in the left caudate. Vascular: There is atherosclerotic calcification of the major vessels at the base of the brain. No evidence of acute hyperdense vessel. Skull: Negative Sinuses/Orbits: Clear/normal Other: None ASPECTS (Alberta Stroke Program Early CT Score) Aspects is difficult in this patient with old infarctions. I do not suspect an acute insult. IMPRESSION: 1. No acute finding by CT. 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 2. 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 No acute finding is suspected by CT. 3. These results were called by telephone at the time of interpretation on 08/27/2018 at 11:47 am to Dr. Daryel November , who verbally acknowledged these results. Electronically Signed   By: Paulina Fusi M.D.   On: 08/27/2018 11:49    PHYSICAL EXAM  Temp:  [98.8 F (37.1 C)-101.2 F (38.4 C)] 100 F (37.8 C) (12/29 0357) Pulse Rate:  [40-115] 71 (12/29 0715) Resp:  [16-32] 20 (12/29 0715) BP: (99-153)/(52-99) 122/64 (12/29 0715) SpO2:  [95 %-100 %] 100 % (12/29 0715) FiO2 (%):  [40 %-50 %] 40 % (12/29 0442) Weight:  [143.5 kg] 143.5 kg (12/29 0500)  General - Well nourished, well developed, intubated on sedation.  Diffuse Anasarca.  Ophthalmologic - fundi not visualized due to noncooperation.  Cardiovascular - irregularly irregular heart rate and rhythm.  Neuro - intubated on sedation, not following commands, eyes open.  inconsistent blinking to visual threat on the left, but not on the right.  PERRL, not tracking objects.  Facial symmetry not able to test due to ET tube.  Tongue protrusion not corporative.  Left upper extremity on restrain, but strong on bicep and finger movement.  Bilateral lower extremity and right upper extremity no movement on pain stimulation.  DTR diminished, Babinski negative.  Sensation, coordination and gait not tested.   ASSESSMENT/PLAN Darren Frazier a 77 y.o.malewith history of HTN, atrial fibrillation on Pradaxa last dose last evening, urethral stricture, cardiomyopathy, peripheral vascular disease, hypertension,  hyperlipidemia, stroke with some balance issues, bilateral lymphedema vitamin D deficiencypresenting with right lower extremity numbness and weaknessand suspected new Lt ICA occlusion.Hedid not receive IV t-PA due to late presentation.  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  MRI head -left MCA and ACA territory  scattered small infarcts  CTA H&N - left ICA, right VA occlusion.  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  2D Echo - EF 60 to 65%  LDL- 81  HgbA1c- 7.1  VTE prophylaxis -SCDs  aspirin 81 mg daily and Pradaxa (dabigatran) twice a dayprior to admission, now on ASA 325mg . Hold off [facility] for now given possible procedures.   Ongoing aggressive stroke risk factor management  Therapy recommendations: pending  Disposition: Pending  Aspiration pneumonia  Fever T-max 101.2->101.3  Leukocytosis WBC 22.2-15.5-11.4  CXR slight improvement of bilateral pulmonary infiltrates  Tube feeding on hold  On Zosyn  CCM on board  Respiratory failure  Intubated on ventilation  On sedation with precedex and fentanyl  CCM on board  Atrial fibrillation, chronic  On Pradaxa PTA, compliant with medication  Currently on aspirin  Hold off anticoagulation given potential procedures  Rate controlled  However, goes into RVR with agitation  Hypotension History of hypertension  BP running low- Cleviprex-> phenyephrine  Avoid hypotension due to 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 as well as basal artery stenosis  BP goal 110-160  Avoid hypotension  Hyperlipidemia  Lipid lowering medication ZOX:WRUE  LDL81, goal < 70  Current lipid lowering medication:none (statin intolerant - abnormal labs)  Diabetes  HgbA1c7.1, goal < 7.0  Uncontrolled  Still hyperglycemia  SSI  CBG monitoring  On Levemir  Other Stroke Risk Factors  Advanced age  Former cigarette smoker - quit  Obesity,Body mass index is 47.79 kg/m., recommend weight loss, diet and exercise as appropriate   Hx stroke/TIA  PVD  Cardiomyopathy  Other Active  Problems  Urethral stricture -on Foley  Diffuse anasarca - will give one dose of lasix  Hospital day # 4  This patient is critically ill due to left MCA and ACA infarct, bilateral ICA occlusions/stenosis, A. fib, respiratory failure, aspiration pneumonia, fever and at significant risk of neurological worsening, death form recurrent stroke, hemorrhagic conversion, seizure, heart failure, septic shock, sepsis. This patient's care requires constant monitoring of vital signs, hemodynamics, respiratory and cardiac monitoring, review of multiple databases, neurological assessment, discussion with family, other specialists and medical decision making of high complexity. I spent 40 minutes of neurocritical care time in the care of this patient.    Marvel Plan, MD PhD Stroke Neurology 08/31/2018 11:01 AM     To contact Stroke Continuity provider, please refer to [email or web address]. After hours, contact General Neurology

## 2018-08-31 NOTE — Progress Notes (Signed)
Paged on call cardiologist due to patient sustaining a-fib w/RVR with frequent PVCs. MD also made aware of troponin level being 12. STAT EKG obtained, which reads similar to previous EKG. Will continue to monitor patient.

## 2018-09-01 ENCOUNTER — Inpatient Hospital Stay (HOSPITAL_COMMUNITY): Payer: Medicare HMO

## 2018-09-01 DIAGNOSIS — I214 Non-ST elevation (NSTEMI) myocardial infarction: Secondary | ICD-10-CM

## 2018-09-01 DIAGNOSIS — I4821 Permanent atrial fibrillation: Secondary | ICD-10-CM

## 2018-09-01 LAB — BASIC METABOLIC PANEL
Anion gap: 8 (ref 5–15)
BUN: 26 mg/dL — ABNORMAL HIGH (ref 8–23)
CO2: 21 mmol/L — ABNORMAL LOW (ref 22–32)
Calcium: 8 mg/dL — ABNORMAL LOW (ref 8.9–10.3)
Chloride: 114 mmol/L — ABNORMAL HIGH (ref 98–111)
Creatinine, Ser: 1.27 mg/dL — ABNORMAL HIGH (ref 0.61–1.24)
GFR calc Af Amer: >60 mL/min (ref >60)
GFR calc non Af Amer: 54 mL/min — ABNORMAL LOW (ref >60)
Glucose, Bld: 162 mg/dL — ABNORMAL HIGH (ref 70–99)
Potassium: 4.4 mmol/L (ref 3.5–5.1)
Sodium: 143 mmol/L (ref 135–145)

## 2018-09-01 LAB — POCT I-STAT 3, ART BLOOD GAS (G3+)
Acid-base deficit: 8 mmol/L — ABNORMAL HIGH (ref 0.0–2.0)
Acid-base deficit: 6 mmol/L — ABNORMAL HIGH (ref 0.0–2.0)
Bicarbonate: 18.5 mmol/L — ABNORMAL LOW (ref 20.0–28.0)
Bicarbonate: 17.8 mmol/L — ABNORMAL LOW (ref 20.0–28.0)
O2 Saturation: 91 %
O2 Saturation: 97 %
PO2 ART: 90 mmHg (ref 83.0–108.0)
Patient temperature: 98.6
TCO2: 20 mmol/L — ABNORMAL LOW (ref 22–32)
TCO2: 19 mmol/L — ABNORMAL LOW (ref 22–32)
pCO2 arterial: 42.4 mmHg (ref 32.0–48.0)
pCO2 arterial: 30.4 mmHg — ABNORMAL LOW (ref 32.0–48.0)
pH, Arterial: 7.247 — ABNORMAL LOW (ref 7.350–7.450)
pH, Arterial: 7.375 (ref 7.350–7.450)
pO2, Arterial: 70 mmHg — ABNORMAL LOW (ref 83.0–108.0)

## 2018-09-01 LAB — GLUCOSE, CAPILLARY
GLUCOSE-CAPILLARY: 135 mg/dL — AB (ref 70–99)
Glucose-Capillary: 132 mg/dL — ABNORMAL HIGH (ref 70–99)
Glucose-Capillary: 165 mg/dL — ABNORMAL HIGH (ref 70–99)
Glucose-Capillary: 201 mg/dL — ABNORMAL HIGH (ref 70–99)
Glucose-Capillary: 170 mg/dL — ABNORMAL HIGH (ref 70–99)
Glucose-Capillary: 138 mg/dL — ABNORMAL HIGH (ref 70–99)
Glucose-Capillary: 142 mg/dL — ABNORMAL HIGH (ref 70–99)
Glucose-Capillary: 137 mg/dL — ABNORMAL HIGH (ref 70–99)
Glucose-Capillary: 169 mg/dL — ABNORMAL HIGH (ref 70–99)
Glucose-Capillary: 145 mg/dL — ABNORMAL HIGH (ref 70–99)
Glucose-Capillary: 154 mg/dL — ABNORMAL HIGH (ref 70–99)
Glucose-Capillary: 158 mg/dL — ABNORMAL HIGH (ref 70–99)
Glucose-Capillary: 125 mg/dL — ABNORMAL HIGH (ref 70–99)
Glucose-Capillary: 152 mg/dL — ABNORMAL HIGH (ref 70–99)
Glucose-Capillary: 133 mg/dL — ABNORMAL HIGH (ref 70–99)
Glucose-Capillary: 127 mg/dL — ABNORMAL HIGH (ref 70–99)
Glucose-Capillary: 119 mg/dL — ABNORMAL HIGH (ref 70–99)
Glucose-Capillary: 114 mg/dL — ABNORMAL HIGH (ref 70–99)
Glucose-Capillary: 134 mg/dL — ABNORMAL HIGH (ref 70–99)
Glucose-Capillary: 163 mg/dL — ABNORMAL HIGH (ref 70–99)

## 2018-09-01 LAB — CBC
HCT: 29.9 % — ABNORMAL LOW (ref 39.0–52.0)
Hemoglobin: 9.3 g/dL — ABNORMAL LOW (ref 13.0–17.0)
MCH: 31.8 pg (ref 26.0–34.0)
MCHC: 31.1 g/dL (ref 30.0–36.0)
MCV: 102.4 fL — ABNORMAL HIGH (ref 80.0–100.0)
Platelets: 269 10*3/uL (ref 150–400)
RBC: 2.92 MIL/uL — ABNORMAL LOW (ref 4.22–5.81)
RDW: 14.6 % (ref 11.5–15.5)
WBC: 13.9 10*3/uL — ABNORMAL HIGH (ref 4.0–10.5)
nRBC: 0.8 % — ABNORMAL HIGH (ref 0.0–0.2)

## 2018-09-01 LAB — IRON AND TIBC
Iron: 16 ug/dL — ABNORMAL LOW (ref 45–182)
Saturation Ratios: 7 % — ABNORMAL LOW (ref 17.9–39.5)
TIBC: 214 ug/dL — ABNORMAL LOW (ref 250–450)
UIBC: 198 ug/dL

## 2018-09-01 LAB — FERRITIN: Ferritin: 294 ng/mL (ref 24–336)

## 2018-09-01 MED ORDER — AMIODARONE LOAD VIA INFUSION
150.0000 mg | Freq: Once | INTRAVENOUS | Status: AC
  Administered 2018-09-01: 150 mg via INTRAVENOUS
  Filled 2018-09-01: qty 83.34

## 2018-09-01 MED ORDER — FUROSEMIDE 10 MG/ML IJ SOLN
80.0000 mg | Freq: Once | INTRAMUSCULAR | Status: AC
  Administered 2018-09-01: 80 mg via INTRAVENOUS
  Filled 2018-09-01: qty 8

## 2018-09-01 MED ORDER — AMIODARONE HCL IN DEXTROSE 360-4.14 MG/200ML-% IV SOLN
30.0000 mg/h | INTRAVENOUS | Status: AC
  Administered 2018-09-02 – 2018-09-03 (×5): 30 mg/h via INTRAVENOUS
  Filled 2018-09-01 (×4): qty 200

## 2018-09-01 MED ORDER — HEPARIN BOLUS VIA INFUSION
3000.0000 [IU] | Freq: Once | INTRAVENOUS | Status: AC
  Administered 2018-09-01: 3000 [IU] via INTRAVENOUS
  Filled 2018-09-01: qty 3000

## 2018-09-01 MED ORDER — AMIODARONE HCL IN DEXTROSE 360-4.14 MG/200ML-% IV SOLN
60.0000 mg/h | INTRAVENOUS | Status: AC
  Administered 2018-09-01 – 2018-09-02 (×2): 60 mg/h via INTRAVENOUS
  Filled 2018-09-01 (×2): qty 200

## 2018-09-01 MED ORDER — HEPARIN (PORCINE) 25000 UT/250ML-% IV SOLN
2100.0000 [IU]/h | INTRAVENOUS | Status: DC
  Administered 2018-09-01: 1500 [IU]/h via INTRAVENOUS
  Administered 2018-09-02 (×2): 1900 [IU]/h via INTRAVENOUS
  Administered 2018-09-03: 2100 [IU]/h via INTRAVENOUS
  Filled 2018-09-01 (×4): qty 250

## 2018-09-01 NOTE — Progress Notes (Signed)
NAME:  Darren Frazier, MRN:  [id number], DOB:  02-02-1941, LOS: 5 ADMISSION DATE:  08/27/2018, CONSULTATION DATE: 08/27/2018 REFERRING MD:  Dr. Corliss Skainseveshwar, CHIEF COMPLAINT:  Rt leg weakness   Brief History   77 [facility] Rt leg numbness and weakness.  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  [facility]  He was intubated for neuro-IR intervention.  Past Medical History  A fib on pradaxa, HTN, Urethral stricture, cardiomyopathy, PAD, HLD, CVA, lymph edema, Vitamin D deficiency, Neuropathy, Hypothyroidism, DM  Significant Hospital Events   12/25 Admit, arteriogram by IR 12/26 A fib with RVR elevated troponin, progressive anemia, hypotension requiring pressors 12/27 Transfuse 1 unit PRBC, change to pressure control 12/28 vomiting, tube feeds held 12/29 improving ileus, feeds resumed.  Consults:  Neurology Neuro IR Cardiology  Procedures:  ETT 12/25 >>  Rt IJ CVL 12/27 >>  Significant Diagnostic Tests:  CT angio head/neck 12/25 >> occlusion of Lt ICA Echo 12/26 >> EF 60 to 65% CT abd/pelvis 12/27 >> small hematoma Rt inguinal region, b/l lower lung AD, ileus CXR 12/30 >> (personally reviewed) poor inspiratory effort, bilateral patchy infiltrates R>L Micro Data:  Sputum 12/26 >>  Scant normal respiratory flora, No organisms on Gram stain.  Antimicrobials:  Zosyn 12/27 >> 12/29  Interim history/subjective:  No overnight events.  Heart rate generally better controlled, but did become more tachycardic with rapid sedation wean yesterday.  Objective   Blood pressure 125/70, pulse 66, temperature (!) 100.6 F (38.1 C), temperature source Axillary, resp. rate 14, height 5\' 9"  (1.753 m), weight (!) 145.7 kg, SpO2 94 %.    Vent Mode: PCV FiO2 (%):  [40 %] 40 % Set Rate:  [16 bmp] 16 bmp PEEP:  [5 cmH20] 5 cmH20 Plateau Pressure:  [24 cmH20-32 cmH20] 24 cmH20   Intake/Output Summary (Last 24 hours) at 09/01/2018 0842 Last data filed at 09/01/2018  0600 Gross per 24 hour  Intake 2891.15 ml  Output 1775 ml  Net 1116.15 ml   Filed Weights   08/30/18 0412 08/31/18 0500 09/01/18 0500  Weight: (!) 143.5 kg (!) 143.5 kg (!) 145.7 kg    Examination:  General - morbid obesity, generalized swelling.  Eyes - + scleral edema. ENT - ETT/OGT in place. No pressure ulceration.  Cardiac - rate controlled atrial fibrillation. HS normal. Chest - basilar rales, no wheeze.  Periods of tachypnea on ventilator, but able to tolerate PSV 15/5 with wit SBI 67. Abdomen - soft, non tender, less distended, + bowel sounds GU - no lesions noted Extremities - 3+ non pitting edema Skin - line sites intact, + blistering Neuro - not following commands. Awake, moving left hand spontaneously. R side remains plegic.  Resolved Hospital Problem list     Assessment & Plan:   Critically ill due to acute respiratory failure with compromised airway in setting of CVA. Requiring mechanical ventilation. Plan - daily SBT - no ready for extubation due to mental status. - Diuresis.  Aspiration pneumonia. Plan - Stop Zosyn as cultures negative. Likely sterile aspiration.   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  Persistent R hemiplegia. Plan - ASA per neurology  Critically ill due to encephalopathy requiring titration of sedative infusions to maintain compliance with mechanical ventilation.  Multifactorial delirium due to sedatives and recent stroke. - Titrate sedatives to off if possible to allow clearing of sensorium. - Add Seroquel to limit dexmedetomidine use.  Critically ill due to hypotension secondary to sedative medications  requiring titration of vasopressors. -titrate phenylephrine to keep MAP>65.  A fib with RVR. Hx of HTN, HLD, elevated troponin. Plan - anticoagulation per cardiology and neurology - goal SBP 110 to 180 per neurology - Add metoprolol for rate control  Ileus. Plan - improved 12/29 - resume tube feeds as tolerated  DM type II  with peripheral neuropathy. Hypothyroidism. Plan - SSI with levemir - continue synthroid - hold outpt metformin, neurontin  Possible contrast extravasation in Rt arm 12/27. Plan - monitor clinically.  Best practice:  Diet: tube feeds DVT prophylaxis: SCDs GI prophylaxis: Protonix Mobility: Bed rest Code Status: Full code Family Communication: updated wife at bedside on 12/28   Labs    CMP Latest Ref Rng & Units 09/01/2018 08/31/2018 08/30/2018  Glucose 70 - 99 mg/dL 638(L) 373(S) 287(G)  BUN 8 - 23 mg/dL 81(L) 22 18  Creatinine 0.61 - 1.24 mg/dL 5.72(I) 2.03 5.59  Sodium 135 - 145 mmol/L 143 142 137  Potassium 3.5 - 5.1 mmol/L 4.4 4.1 4.1  Chloride 98 - 111 mmol/L 114(H) 114(H) 107  CO2 22 - 32 mmol/L 21(L) 21(L) 19(L)  Calcium 8.9 - 10.3 mg/dL 8.0(L) 7.8(L) 7.6(L)  Total Protein 6.4 - 8.2 g/dL - - -  Total Bilirubin 0.2 - 1.0 mg/dL - - -  Alkaline Phos 50 - 136 Unit/L - - -  AST 15 - 37 Unit/L - - -  ALT 12 - 78 U/L - - -   CBC Latest Ref Rng & Units 09/01/2018 08/31/2018 08/30/2018  WBC 4.0 - 10.5 K/uL 13.9(H) 11.4(H) 15.5(H)  Hemoglobin 13.0 - 17.0 g/dL 7.4(B) 6.3(A) 10.3(L)  Hematocrit 39.0 - 52.0 % 29.9(L) 29.4(L) 31.9(L)  Platelets 150 - 400 K/uL 269 227 251   ABG    Component Value Date/Time   PHART 7.364 08/27/2018 2238   PCO2ART 44.7 08/27/2018 2238   PO2ART 99.0 08/27/2018 2238   HCO3 25.5 08/27/2018 2238   TCO2 27 08/27/2018 2238   O2SAT 97.0 08/27/2018 2238   CBG (last 3)  No results for input(s): GLUCAP in the last 72 hours.    ADDENDUM:  Patient self-extubated this morning. Maintaining oxygenation, so will hold off re-intubation, stop sedation and diurese. High reintubation risk.  Critical Care time spent: 40 min spent including review of clinical data, lab and imaging review, examination of patient and assessment of sedation goals and ongoing evaluation of respiratory status.  Lynnell Catalan, MD [facility] ICU Physician [facility] [place] [facility]  Pager: [phone] Mobile: [phone] After hours: [phone].  09/01/2018, 8:56 AM

## 2018-09-01 NOTE — Progress Notes (Signed)
Progress Note  Patient Name: Darren Frazier Date of Encounter: 09/01/2018  Primary Cardiologist:   No primary care provider on file.   Subjective   Extubated.  Confused but no distress    Inpatient Medications    Scheduled Meds: . aspirin EC  325 mg Oral Daily  . chlorhexidine gluconate (MEDLINE KIT)  15 mL Mouth Rinse BID  . Chlorhexidine Gluconate Cloth  6 each Topical Q0600  . cholecalciferol  1,000 Units Per Tube Daily  . feeding supplement (PRO-STAT SUGAR FREE 64)  30 mL Oral BID  . feeding supplement (VITAL HIGH PROTEIN)  1,000 mL Per Tube QID  . insulin aspart  0-20 Units Subcutaneous Q4H  . insulin detemir  10 Units Subcutaneous Daily  . levothyroxine  44 mcg Intravenous Daily  . mouth rinse  15 mL Mouth Rinse 10 times per day  . mupirocin ointment  1 application Nasal BID   Continuous Infusions: . sodium chloride 50 mL/hr at 09/01/18 0600  . dexmedetomidine (PRECEDEX) IV infusion Stopped (09/01/18 1000)  . famotidine (PEPCID) IV 20 mg (09/01/18 1002)  . fentaNYL infusion INTRAVENOUS Stopped (09/01/18 1000)  . phenylephrine (NEO-SYNEPHRINE) Adult infusion 40 mcg/min (09/01/18 0855)  . piperacillin-tazobactam (ZOSYN)  IV 12.5 mL/hr at 09/01/18 0600   PRN Meds: acetaminophen **OR** acetaminophen (TYLENOL) oral liquid 160 mg/5 mL **OR** acetaminophen, fentaNYL, metoprolol tartrate, ondansetron (ZOFRAN) IV, polyvinyl alcohol, senna-docusate   Vital Signs      Vitals:   09/01/18 1015 09/01/18 1030 09/01/18 1045 09/01/18 1100  BP: 134/76 (!) 142/90 (!) 144/80 128/63  Pulse: 96 90 93 (!) 106  Resp: (!) 25 (!) 26 (!) 27 (!) 27  Temp:      TempSrc:      SpO2: 95% 95% 95% 96%  Weight:      Height:       Physical Exam   GEN: No  acute distress.   Neck: No  JVD Cardiac: Irregular RR, no murmurs, rubs, or gallops.  Respiratory:   Decreased breath sounds GI: Soft, nontender, non-distended, normal bowel sounds  MS:  Moderate diffuse edema; No  deformity. Neuro:   Apparent right hemiparesis Psych:   Awake not answering questions.    Intake/Output Summary (Last 24 hours) at 09/01/2018 1217 Last data filed at 09/01/2018 1100 Gross per 24 hour  Intake 3086.57 ml  Output 1175 ml  Net 1911.57 ml   Filed Weights   08/30/18 0412 08/31/18 0500 09/01/18 0500  Weight: (!) 143.5 kg (!) 143.5 kg (!) 145.7 kg    Telemetry    Atrial fib rate controlled.   - Personally Reviewed  ECG    EKG:  RBBB, non specific ST T wave changes.  08/31/18  - Personally Reviewed  Labs    Chemistry Recent Labs  Lab 08/30/18 0655 08/31/18 1027 09/01/18 0046  NA 137 142 143  K 4.1 4.1 4.4  CL 107 114* 114*  CO2 19* 21* 21*  GLUCOSE 191* 146* 162*  BUN 18 22 26*  CREATININE 1.18 1.23 1.27*  CALCIUM 7.6* 7.8* 8.0*  GFRNONAA 59* 56* 54*  GFRAA >60 >60 >60  ANIONGAP 11 7 8      Hematology Recent Labs  Lab 08/30/18 0545 08/31/18 0517 09/01/18 0046  WBC 15.5* 11.4* 13.9*  RBC 3.15* 2.81* 2.92*  HGB 10.3* 9.3* 9.3*  HCT 31.9* 29.4* 29.9*  MCV 101.3* 104.6* 102.4*  MCH 32.7 33.1 31.8  MCHC 32.3 31.6 31.1  RDW 14.5 15.6* 14.6  PLT 251 227 269  Cardiac Enzymes Recent Labs  Lab 08/29/18 0353 08/29/18 0708 08/29/18 1201 08/31/18 1027  TROPONINI 3.59* 6.39* 15.82* 12.47*   No results for input(s): TROPIPOC in the last 168 hours.   BNPNo results for input(s): BNP, PROBNP in the last 168 hours.   DDimer No results for input(s): DDIMER in the last 168 hours.   Radiology    Dg Chest Port 1 View  Result Date: 09/01/2018 CLINICAL DATA:  Respiratory failure. EXAM: PORTABLE CHEST 1 VIEW COMPARISON:  08/31/2018 FINDINGS: Endotracheal tube terminates 4.2 cm above the carina. Right jugular catheter terminates over the mid SVC. Enteric tube courses into the left upper abdomen. The cardiac silhouette remains enlarged. Patchy airspace opacities throughout both lungs have not significantly changed. IMPRESSION: Bilateral airspace disease  without significant interval change. Electronically Signed   By: Logan Bores M.D.   On: 09/01/2018 06:54   Dg Chest Port 1 View  Result Date: 08/31/2018 CLINICAL DATA:  CXR for Respiratory failure. Hx of HTN, stroke, AAA, Diabetes, and Cardiomyopathy. EXAM: PORTABLE CHEST 1 VIEW COMPARISON:  08/30/2018 FINDINGS: Endotracheal tube is in place, tip 3.8 centimeters above the carina. Nasogastric tube is in place, tip beyond the gastroesophageal junction and off the image. A RIGHT IJ central line tip overlies the superior vena cava. Patient is rotated towards the LEFT. The heart is enlarged. There is persistent airspace filling opacity throughout the RIGHT lung, slightly improved. There is significant opacity in the LEFT lung base, consistent with atelectasis or consolidation and increased since 08/27/2018. IMPRESSION: 1. Slight improvement in aeration of the RIGHT lung. 2. Increased opacity in the LEFT lung base. Electronically Signed   By: Nolon Nations M.D.   On: 08/31/2018 09:52    Cardiac Studies   Study Conclusions  - Left ventricle: The cavity size was normal. Systolic function was   normal. The estimated ejection fraction was in the range of 60%   to 65%. Images were inadequate for LV wall motion assessment. The   study is not technically sufficient to allow evaluation of LV   diastolic function. - Aortic valve: Valve mobility was moderately restricted.   Suboptimal image quality limited Doppler assessment of aortic   valve systolic gradient. There was no regurgitation. - Mitral valve: Moderately calcified annulus. There was trivial   regurgitation. - Left atrium: The atrium was moderately dilated. - Tricuspid valve: There was trivial regurgitation.   Patient Profile     77 y.o. 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 We are involved for management of his atrial fib.   Assessment & Plan    Atrial fib:     To restart Eliquis if able to take PO.  Rate is well controlled.  He has had two doses of IV lopressor.     Elevated Troponin/NSTEMI: Trending down.   EF was OK on echo.   Medical management.  Will be on PO beta blocker when able.    For questions or updates, please contact [place] Please consult [email or web address] for contact info under Cardiology/STEMI.   Signed, Minus Breeding, MD  09/01/2018, 12:17 PM

## 2018-09-01 NOTE — Progress Notes (Signed)
  Amiodarone Drug - Drug Interaction Consult Note  Recommendations: No medication adjustment recommendation at this time. Monitor vital signs and future interactions.   Amiodarone is metabolized by the cytochrome P450 system and therefore has the potential to cause many drug interactions. Amiodarone has an average plasma half-life of 50 days (range 20 to 100 days).   There is potential for drug interactions to occur several weeks or months after stopping treatment and the onset of drug interactions may be slow after initiating amiodarone.   []  Statins: Increased risk of myopathy. Simvastatin- restrict dose to 20mg  daily. Other statins: counsel patients to report any muscle pain or weakness immediately.  []  Anticoagulants: Amiodarone can increase anticoagulant effect. Consider warfarin dose reduction. Patients should be monitored closely and the dose of anticoagulant altered accordingly, remembering that amiodarone levels take several weeks to stabilize.  []  Antiepileptics: Amiodarone can increase plasma concentration of phenytoin, the dose should be reduced. Note that small changes in phenytoin dose can result in large changes in levels. Monitor patient and counsel on signs of toxicity.  []  Beta blockers: increased risk of bradycardia, AV block and myocardial depression. Sotalol - avoid concomitant use.  []   Calcium channel blockers (diltiazem and verapamil): increased risk of bradycardia, AV block and myocardial depression.  []   Cyclosporine: Amiodarone increases levels of cyclosporine. Reduced dose of cyclosporine is recommended.  []  Digoxin dose should be halved when amiodarone is started.  []  Diuretics: increased risk of cardiotoxicity if hypokalemia occurs.  []  Oral hypoglycemic agents (glyburide, glipizide, glimepiride): increased risk of hypoglycemia. Patient's glucose levels should be monitored closely when initiating amiodarone therapy.   []  Drugs that prolong the QT interval:   Torsades de pointes risk may be increased with concurrent use - avoid if possible.  Monitor QTc, also keep magnesium/potassium WNL if concurrent therapy can't be avoided. [name] Antibiotics: e.g. fluoroquinolones, erythromycin. . Antiarrhythmics: e.g. quinidine, procainamide, disopyramide, sotalol. . Antipsychotics: e.g. phenothiazines, haloperidol.  . Lithium, tricyclic antidepressants, and methadone.  Thank You,  Sherron Monday, PharmD, BCCCP Clinical Pharmacist  Pager: [phone] Phone: [phone] 09/01/2018 7:47 PM

## 2018-09-01 NOTE — Progress Notes (Signed)
Patient self extubated and placed on [place]. Vitals stable at this time. Family and RN at bedside.MD aware. RT will continue to monitor.

## 2018-09-01 NOTE — Progress Notes (Signed)
STROKE TEAM PROGRESS NOTE   SUBJECTIVE (INTERVAL HISTORY) His RN and son and wife are at the bedside.  Pt was self extubated overnight, currently tolerating, however still has weak cough and mild shortness of breath.  Eyes open, able to follow very limited commands. As per RN, he was able to speak out his name. Pending swallow, likely need cortrak.   OBJECTIVE Temp:  [98.4 F (36.9 C)-100.6 F (38.1 C)] 100.6 F (38.1 C) (12/30 0800) Pulse Rate:  [44-160] 90 (12/30 1030) Cardiac Rhythm: Atrial fibrillation (12/30 0400) Resp:  [14-31] 26 (12/30 1030) BP: (85-145)/(47-102) 142/90 (12/30 1030) SpO2:  [91 %-100 %] 95 % (12/30 1030) FiO2 (%):  [40 %] 40 % (12/30 0859) Weight:  [145.7 kg] 145.7 kg (12/30 0500)  Recent Labs  Lab 08/31/18 1616 08/31/18 1956 08/31/18 2307 09/01/18 0300 09/01/18 0827  GLUCAP 133* 127* 119* 114* 134*   Recent Labs  Lab 08/28/18 0600 08/28/18 0921 08/28/18 1826 08/29/18 0416 08/29/18 1905 08/30/18 0655 08/31/18 1027 09/01/18 0046  NA 138  --   --  136  --  137 142 143  K 4.0  --   --  4.1  --  4.1 4.1 4.4  CL 104  --   --  103  --  107 114* 114*  CO2 25  --   --  21*  --  19* 21* 21*  GLUCOSE 140*  --   --  232*  --  191* 146* 162*  BUN 12  --   --  11  --  18 22 26*  CREATININE 0.98  --   --  1.23  --  1.18 1.23 1.27*  CALCIUM 7.6*  --   --  7.9*  --  7.6* 7.8* 8.0*  MG  --  1.5* 1.5* 1.6* 1.7  --   --   --   PHOS  --  3.4 3.3 3.2 2.2*  --   --   --    No results for input(s): AST, ALT, ALKPHOS, BILITOT, PROT, ALBUMIN in the last 168 hours. Recent Labs  Lab 08/28/18 0600  08/29/18 0416 08/29/18 1734 08/30/18 0545 08/31/18 0517 09/01/18 0046  WBC 16.1*   < > 14.2* 22.2* 15.5* 11.4* 13.9*  NEUTROABS 12.4*  --   --   --   --   --   --   HGB 9.2*   < > 8.7* 10.4* 10.3* 9.3* 9.3*  HCT 28.6*   < > 26.9* 32.8* 31.9* 29.4* 29.9*  MCV 99.0   < > 101.1* 105.5* 101.3* 104.6* 102.4*  PLT 229   < > 165 233 251 227 269   < > = values in this  interval not displayed.   Recent Labs  Lab 08/29/18 0353 08/29/18 0708 08/29/18 1201 08/31/18 1027  TROPONINI 3.59* 6.39* 15.82* 12.47*   No results for input(s): LABPROT, INR in the last 72 hours. No results for input(s): COLORURINE, LABSPEC, PHURINE, GLUCOSEU, HGBUR, BILIRUBINUR, KETONESUR, PROTEINUR, UROBILINOGEN, NITRITE, LEUKOCYTESUR in the last 72 hours.  Invalid input(s): APPERANCEUR     Component Value Date/Time   CHOL 138 08/28/2018 0600   TRIG 171 (H) 08/28/2018 0921   HDL 24 (L) 08/28/2018 0600   CHOLHDL 5.8 08/28/2018 0600   VLDL 33 08/28/2018 0600   LDLCALC 81 08/28/2018 0600   Lab Results  Component Value Date   HGBA1C 7.1 (H) 08/28/2018   No results found for: LABOPIA, COCAINSCRNUR, LABBENZ, AMPHETMU, THCU, LABBARB  No results for input(s): ETH in  the last 168 hours.  I have personally reviewed the radiological images below and agree with the radiology interpretations.  Ct Angio Head W Or Wo Contrast  Result Date: 08/27/2018 CLINICAL DATA:  Acute presentation with speech disturbance. EXAM: CT ANGIOGRAPHY HEAD AND NECK TECHNIQUE: Multidetector CT imaging of the head and neck was performed using the standard protocol during bolus administration of intravenous contrast. Multiplanar CT image reconstructions and MIPs were obtained to evaluate the vascular anatomy. Carotid stenosis measurements (when applicable) are obtained utilizing NASCET criteria, using the distal internal carotid diameter as the denominator. CONTRAST:  75mL ISOVUE-370 IOPAMIDOL (ISOVUE-370) INJECTION 76% COMPARISON:  CT same day. FINDINGS: CTA NECK FINDINGS Aortic arch: Aortic atherosclerosis.  No aneurysm or dissection. Right carotid system: Common carotid artery shows some atherosclerotic plaque but is widely patent to the bifurcation. There is soft and calcified plaque affecting the carotid bifurcation and ICA bulb region. There is severe stenosis in the distal bulb region with luminal diameter of  1 mm. The vessel is quite tortuous beyond that but widely patent to the upper cervical region, where there is soft and calcified plaque resulting in minimal diameter of 3.5 mm. Compared to an expected diameter of 5 mm, stenosis in the bulb is 80% or greater. Stenosis in the upper cervical region is 30%. Left carotid system: Common carotid artery shows atherosclerotic plaque but is sufficiently patent to the bifurcation region. There is calcified and soft plaque at the carotid bifurcation and ICA bulb. There is left internal carotid artery occlusion at the distal bulb. 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 The left vertebral artery shows 50% stenosis at its origin but is sufficiently patent beyond that through the cervical region to the foramen magnum. Skeleton: Mid cervical spondylosis. Other neck: No mass or lymphadenopathy. Upper chest: Interstitial prominence which could go along with fluid overload or mild edema. Review of the MIP images confirms the above findings CTA HEAD FINDINGS Anterior circulation: Left internal carotid artery is occluded without antegrade flow through the skull base. Right internal carotid artery shows atherosclerotic disease in the carotid siphon region with stenosis estimated at 50-70%. The anterior and middle cerebral vessels are patent. There is atherosclerotic irregularity of the M1 segment with stenosis of 50-70%. No acute vessel occlusion is identified. There is a chronic punctate calcified embolus in 1 of the insular branches, but this was present in 2014. On the left, there is reconstituted flow in the distal siphon. Severe stenosis of the supraclinoid ICA, 80% or greater. Flow is present in the anterior and middle cerebral vessels, secondary to reconstituted flow and flow through communicating arteries. There is 50% stenosis in the M1 segment. I do not see any occluded or missing large or medium vessels in the MCA territory. Posterior  circulation: Right vertebral artery shows no antegrade flow at the foramen magnum. There is retrograde flow in the distal right vertebral. Left vertebral artery is patent at the foramen magnum. There is atherosclerotic disease of the V4 segment with stenosis estimated at 70%. There are serial stenoses. The vessel does show flow to the basilar, which shows atherosclerotic irregularity but is patent. Flow is present in the superior cerebellar and posterior cerebral arteries. Venous sinuses: Patent and normal. Anatomic variants: None significant. Delayed phase: No abnormal enhancement. 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 Severe irregular atherosclerotic disease of the right carotid bifurcation with stenosis of 80% in the  bulb region and 30% in the distal ICA. 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 50% stenosis of the left vertebral artery origin. Severe atherosclerotic disease in both carotid siphon regions. Right siphon stenosis estimated at 50-70%. Reconstituted flow in the distal left siphon and supraclinoid region. 80% supraclinoid stenosis. Flow in both anterior and middle cerebral artery territories. Stenosis of both M1 segments estimated at 50-70%. I do not identify any large or medium vessel occlusion acutely within the MCA branch vessels. Severely disease left V4 segment with serial stenoses proximal to the basilar estimated at 70%. Basilar atherosclerotic irregularity without flow limiting stenosis. Posterior circulation branch vessels do show flow, including right PICA which is supplied by retrograde flow in the distal right vertebral artery. Electronically Signed   By: Paulina Fusi M.D.   On: 08/27/2018 13:14   Dg Chest 2 View  Result Date: 08/27/2018 CLINICAL DATA:  Chest pain and shortness of breath.  Cardiomyopathy. EXAM: CHEST - 2 VIEW COMPARISON:  03/07/2013 FINDINGS: Mild cardiomegaly. Aortic  atherosclerosis. Diffuse interstitial infiltrates, consistent with pulmonary edema. Mild subsegmental atelectasis also seen in the left upper lobe. No evidence of pulmonary consolidation or significant pleural effusion. IMPRESSION: Mild cardiomegaly and diffuse interstitial edema, consistent with congestive heart failure. Electronically Signed   By: Myles Rosenthal M.D.   On: 08/27/2018 12:02   Ct Angio Neck W Or Wo Contrast  Result Date: 08/27/2018 CLINICAL DATA:  Acute presentation with speech disturbance. EXAM: CT ANGIOGRAPHY HEAD AND NECK TECHNIQUE: Multidetector CT imaging of the head and neck was performed using the standard protocol during bolus administration of intravenous contrast. Multiplanar CT image reconstructions and MIPs were obtained to evaluate the vascular anatomy. Carotid stenosis measurements (when applicable) are obtained utilizing NASCET criteria, using the distal internal carotid diameter as the denominator. CONTRAST:  75mL ISOVUE-370 IOPAMIDOL (ISOVUE-370) INJECTION 76% COMPARISON:  CT same day. FINDINGS: CTA NECK FINDINGS Aortic arch: Aortic atherosclerosis.  No aneurysm or dissection. Right carotid system: Common carotid artery shows some atherosclerotic plaque but is widely patent to the bifurcation. There is soft and calcified plaque affecting the carotid bifurcation and ICA bulb region. There is severe stenosis in the distal bulb region with luminal diameter of 1 mm. The vessel is quite tortuous beyond that but widely patent to the upper cervical region, where there is soft and calcified plaque resulting in minimal diameter of 3.5 mm. Compared to an expected diameter of 5 mm, stenosis in the bulb is 80% or greater. Stenosis in the upper cervical region is 30%. Left carotid system: Common carotid artery shows atherosclerotic plaque but is sufficiently patent to the bifurcation region. There is calcified and soft plaque at the carotid bifurcation and ICA bulb. There is left internal  carotid artery occlusion at the distal bulb. 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 The left vertebral artery shows 50% stenosis at its origin but is sufficiently patent beyond that through the cervical region to the foramen magnum. Skeleton: Mid cervical spondylosis. Other neck: No mass or lymphadenopathy. Upper chest: Interstitial prominence which could go along with fluid overload or mild edema. Review of the MIP images confirms the above findings CTA HEAD FINDINGS Anterior circulation: Left internal carotid artery is occluded without antegrade flow through the skull base. Right internal carotid artery shows atherosclerotic disease in the carotid siphon region with stenosis estimated at 50-70%. The anterior and middle cerebral vessels are patent. There is atherosclerotic irregularity of the M1 segment with stenosis of 50-70%. No acute  vessel occlusion is identified. There is a chronic punctate calcified embolus in 1 of the insular branches, but this was present in 2014. On the left, there is reconstituted flow in the distal siphon. Severe stenosis of the supraclinoid ICA, 80% or greater. Flow is present in the anterior and middle cerebral vessels, secondary to reconstituted flow and flow through communicating arteries. There is 50% stenosis in the M1 segment. I do not see any occluded or missing large or medium vessels in the MCA territory. Posterior circulation: Right vertebral artery shows no antegrade flow at the foramen magnum. There is retrograde flow in the distal right vertebral. Left vertebral artery is patent at the foramen magnum. There is atherosclerotic disease of the V4 segment with stenosis estimated at 70%. There are serial stenoses. The vessel does show flow to the basilar, which shows atherosclerotic irregularity but is patent. Flow is present in the superior cerebellar and posterior cerebral arteries. Venous sinuses: Patent and normal. Anatomic variants: None  significant. Delayed phase: No abnormal enhancement. 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 Severe irregular atherosclerotic disease of the right carotid bifurcation with stenosis of 80% in the bulb region and 30% in the distal ICA. 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 50% stenosis of the left vertebral artery origin. Severe atherosclerotic disease in both carotid siphon regions. Right siphon stenosis estimated at 50-70%. Reconstituted flow in the distal left siphon and supraclinoid region. 80% supraclinoid stenosis. Flow in both anterior and middle cerebral artery territories. Stenosis of both M1 segments estimated at 50-70%. I do not identify any large or medium vessel occlusion acutely within the MCA branch vessels. Severely disease left V4 segment with serial stenoses proximal to the basilar estimated at 70%. Basilar atherosclerotic irregularity without flow limiting stenosis. Posterior circulation branch vessels do show flow, including right PICA which is supplied by retrograde flow in the distal right vertebral artery. Electronically Signed   By: Paulina Fusi M.D.   On: 08/27/2018 13:14   Mr Brain Wo Contrast  Result Date: 08/28/2018 CLINICAL DATA:  Follow-up examination for acute stroke, right lower extremity weakness. Known left ICA occlusion. EXAM: MRI HEAD WITHOUT CONTRAST TECHNIQUE: Multiplanar, multiecho pulse sequences of the brain and surrounding structures were obtained without intravenous contrast. COMPARISON:  Prior CTs from 08/27/2018. FINDINGS: Brain: Generalized age-related cerebral atrophy. Patchy and confluent T2/FLAIR hyperintensity within the periventricular and deep white matter both cerebral hemispheres most consistent with chronic microvascular ischemic disease, moderate nature. Scatter remote cortical infarcts involving the posterior left frontoparietal region, right parietal lobe, and  right frontal lobe are seen. Small remote right cerebellar infarct. Remote lacunar infarct present at the left caudate head. Scattered chronic hemosiderin staining seen about several of these infarcts. Scattered multifocal foci of restricted diffusion involving the cortical subcortical left frontal, parietal, and temporal occipital regions, compatible with acute ischemic left MCA territory infarcts. Largest area of infarction seen at the high left frontal parietal region and measures 2.5 cm (series 3, image 48). Possible faint petechial hemorrhage about a few of these infarcts without frank hemorrhagic transformation or significant mass effect. Findings likely embolic in nature. No mass lesion, midline shift or mass effect. Mild diffuse ventricular prominence related global parenchymal volume loss without hydrocephalus. No extra-axial fluid collection. Pituitary gland within normal limits. Vascular: Abnormal flow void within the left ICA to its cavernous segment, compatible with previously identified left ICA occlusion. Major intracranial vascular flow voids otherwise maintained  at the skull base. Skull and upper cervical spine: Craniocervical junction within normal limits. Upper cervical spine normal. Bone marrow signal intensity within normal limits. No scalp soft tissue abnormality. Sinuses/Orbits: Patient status post bilateral ocular lens replacement. Globes and orbital soft tissues demonstrate no acute finding. Scattered mucosal thickening seen throughout the paranasal sinuses with superimposed scattered air-fluid levels. Fluid seen within the nasopharynx. Patient likely intubated. Trace bilateral mastoid effusions noted. Inner ear structures normal. Other: None. IMPRESSION: 1. 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 Possible faint scattered associated petechial hemorrhage without frank hemorrhagic transformation or significant mass effect. 2. Abnormal  flow void within the left ICA to its cavernous segment, consistent with known left ICA occlusion. 3. 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 4. Moderately advanced cerebral atrophy with chronic small vessel ischemic disease. Electronically Signed   By: Rise Mu M.D.   On: 08/28/2018 15:33   Ct Abdomen Pelvis W Contrast  Result Date: 08/29/2018 CLINICAL DATA:  Abdominal distention.  Bloating. EXAM: CT ABDOMEN AND PELVIS WITH CONTRAST TECHNIQUE: Multidetector CT imaging of the abdomen and pelvis was performed using the standard protocol following bolus administration of intravenous contrast. CONTRAST:  OMNIPAQUE IOHEXOL 300 MG/ML  SOLN COMPARISON:  Abdominal radiograph dated 08/28/2018 and abdominal ultrasound dated 02/28/2016 FINDINGS: Lower chest: There are bilateral consolidative infiltrates in the lower lobes as well as minimal bilateral pleural effusions. Aortic atherosclerosis. Coronary artery calcifications. Hepatobiliary: Liver parenchyma is normal. No biliary ductal dilatation. Vicarious excretion of contrast in the normal appearing gallbladder. Pancreas: Unremarkable. No pancreatic ductal dilatation or surrounding inflammatory changes. Spleen: Normal in size without focal abnormality. Adrenals/Urinary Tract: Adrenal glands are normal. Normal right kidney. Large chronic cyst in the lower pole of the left kidney with some calcification in the wall. Maximum diameter of the cyst is approximately 9 cm. Stomach/Bowel: NG tube tip in the body of the stomach. There are few diverticula in the proximal sigmoid portion of the colon. There is moderate air in the colon without significant distention. Small bowel appears normal. Fluid and air in the stomach. Appendix has been removed. Vascular/Lymphatic: There is a poorly defined hematoma in the right inguinal region in the panniculus. This measures approximately 12 x  5 x 2 cm and is probably related to recent angiography. There are several small lymph nodes in the right inguinal region, the largest being 15 mm in diameter. Extensive aortic atherosclerosis. No adenopathy. Reproductive: Dense calcification in the normal-sized prostate gland. Other: There is a small amount of ascites in the abdomen, nonspecific. Small periumbilical hernia containing only fat. Edema in the subcutaneous fat of both flanks, right greater than left. Slight edema in the panniculus and in the lateral aspect of both thighs. Musculoskeletal: No acute abnormalities. Multilevel degenerative disc disease in the thoracic spine and lumbar spine. IMPRESSION: 1. Small hematoma in the right inguinal region in the panniculus, probably secondary to the recent angiography. 2. Bilateral consolidative infiltrates in the posterior aspect of both lower lobes with tiny adjacent pleural effusions. 3. Small amount of nonspecific ascites. 4. Slightly prominent gas in the bowel without evidence of bowel obstruction. This probably represents an ileus. Electronically Signed   By: Francene Boyers M.D.   On: 08/29/2018 17:03   Ir Ct Head Ltd  Result Date: 08/31/2018 CLINICAL DATA:  New onset right-sided weakness with mild word-finding difficulties. Abnormal CT angiogram of the head and neck revealing non flow limiting  filling defect in the left middle cerebral artery M1 segment, and also acute occlusion of the left internal carotid artery at the bulb. EXAM: IR ANGIO INTRA EXTRACRAN SEL COM CAROTID INNOMINATE UNI RIGHT MOD SED; IR PERCUTANEOUS ART THORMBECTOMY/INFUSION INTRACRANIAL INCLUDE DIAG ANGIO; IR ANGIO VERTEBRAL SEL SUBCLAVIAN INNOMINATE UNI LEFT MOD SED; IR CT HEAD LIMITED COMPARISON:  CT angiogram of the head and neck of 08/27/2018 MEDICATIONS: Heparin 3,000 units IV; . Ancef 2 g IV antibiotic was administered within 1 hour of the procedure ANESTHESIA/SEDATION: General anesthesia CONTRAST:  Isovue 300 approximately  100 mL FLUOROSCOPY TIME:  Fluoroscopy Time: 60 minutes 0 seconds (4327 mGy). COMPLICATIONS: None immediate. TECHNIQUE: Informed written consent was obtained from the patient after a thorough discussion of the procedural risks, benefits and alternatives. All questions were addressed. Maximal Sterile Barrier Technique was utilized including caps, mask, sterile gowns, sterile gloves, sterile drape, hand hygiene and skin antiseptic. A timeout was performed prior to the initiation of the procedure. The right groin was prepped and draped in the usual sterile fashion. Thereafter using modified Seldinger technique, transfemoral access into the right common femoral artery was obtained without difficulty. Over a 0.035 inch guidewire, a 5 French Pinnacle sheath was inserted. Through this, and also over 0.035 inch guidewire, a 5 [place] JB 1 catheter was advanced to the aortic arch region and selectively positioned in the right common carotid artery, the innominate artery , the left common carotid artery and the left vertebral artery. FINDINGS: The left common carotid artery tear g demonstrates a moderate tortuosity of the proximal left common carotid artery The left external carotid artery proximally has a mild to moderate stenosis. Is branches are otherwise normally opacified The left internal carotid artery at the bulb would demonstrates the complete angiographic occlusion with no evidence of a string sign on the delayed arterial images. The innominate artery arteriogram demonstrates patency of the right subclavian artery and the right common carotid artery proximally There is no angiographic evidence of the right vertebral artery The right common carotid bifurcation demonstrates the right external carotid artery and its major branches to be widely patent The right internal carotid artery just distal to the bulb has a severe 90% plus stenosis. Distal to this there is a U-shaped configuration of the right internal carotid  artery. More distally the vessel is normal caliber The petrous the cavernous segments and supraclinoid segments are widely patent There is mild atherosclerotic irregularity involving the proximal cavernous, and distal cavernous portions of the right internal carotid artery. Right posterior communicating artery is seen opacifying the right posterior cerebral artery and the distal basilar artery. The right middle cerebral artery has approximately 30 40% stenosis of the right M1 segment The trifurcation branches appear to be widely patent into the capillary and venous phases The right anterior cerebral artery A1 segment and distally demonstrate wide patency is into the capillary and venous phases There is simultaneous cross-filling via the anterior communicating artery of the left anterior cerebral artery A2 segment and distally. Also demonstrated is opacification of the left anterior cerebral A1 segment, with flow noted into the left middle cerebral artery distribution. Unopacified blood is seen in the left middle cerebral artery from the collateral circulation. 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  More distally the vessel is seen to opacify to the cranial skull base There is severe focal of focal stenosis of the left vertebrobasilar junction just distal to the origin of the left posterior-inferior cerebellar artery Additionally there is a approximately 70% stenosis of the proximal basilar artery More distally the basilar artery demonstrates mild caliber irregularity. There is a opacification of the left posterior cerebral artery the superior cerebellar arteries left greater than right, and the anterior-inferior cerebellar  arteries Retrograde opacification of the right vertebrobasilar junction to the level of the right posterior-inferior cerebellar arteries is noted. Caliber irregularity of the right posterior-inferior cerebellar artery suggests intracranial arteriosclerosis. Left internal carotid artery occlusion or revascularization attempt The diagnostic JB 1 catheter left common carotid artery was then exchanged over a 0.035 inch 300 cm a 7 French Pinnacle sheath in the right groin. A diagnostic JB 1 catheter were then advanced over the rise exchange guidewire to the descending thoracic aorta. The 7 French Pinnacle sheath was then exchanged over a 0.035 inch 100 cm Amplatz stiff guidewire for a a 8 French 55 cm Brite tip neurovascular sheath using biplane roadmap technique and constant fluoroscopic guidance. Good good aspiration was obtained from the side port of the neurovascular sheath. This was then connected to continuous heparinized saline infusion. The embolus guidewire was then removed. Through the 8 French neurovascular sheath, over a 0.035 inch roadrunner guidewire, a 5 5 [place] JB 1 catheter was advanced to the recover region and select position in the left common carotid artery. Using biplane technique and constant fluoroscopic guidance, over the of the 5 inch roadrunner guidewire, the 5 [place] JB 1 catheter was advanced to the left external carotid artery and exchanged over a 0.035 inch 300 cm rise exchange guidewire for an 8 French 85 cm flow gap balloon guide catheter which had been prepped with 50% contrast and 50% heparinized saline infusion The guidewire was removed. Good aspiration obtained from the hub of the 8 [place] Flo guide catheter. Gentle contrast injection demonstrated no evidence of spasms dissections or of intraluminal filling defects. At this time, in a coaxial manner and with constant heparinized saline infusion, a tree approached 1 microcatheter was advanced over a point 0.014 inch soft tip synchro  micro guidewire to the distal end of the Flo guide catheter The Flood guide catheter was advanced to just inside the left internal carotid artery bulb. Thereafter, multiple attempts were made to advance the microcatheter over the micro guidewire first with Ace synchro soft and then a regular synchro guidewire. In 016 headliner double angled micro guidewire was also utilized in order to access the occluded left internal carotid artery. It was noted that there was a severe tortuosity at the junction of the proximal and the middle 1/3 of the left internal carotid artery Following multiple attempts it was decided to stop. A control was then performed through the [facility] guide catheter in the left common carotid artery which revealed brisk cross-filling of the left internal carotid artery distal cavernous and supraclinoid segments from the left external carotid artery branches via the ipsilateral ophthalmic artery Flow is noted into the left middle cerebral artery as well Also noted was a retrograde opacification from the left posterior communicating artery into the supraclinoid left ICA and then middle cerebral artery An earlier diagnostic catheter arteriogram of the right common carotid artery he had revealed partial cross-filling via the anterior communicating artery from the right internal carotid artery Given the the collateral circulation, it was decided to stop  the procedure. Throughout the procedure, the patient's blood pressure and neurological status remained stable No evidence of hemodynamic instability was noted No gross mass-effect or move filling defects or extravasation was seen Prior to the attempted revascularization of the left internal carotid artery occlusion, the patient was given 180 mg of Brilinta, and 81 mg of aspirin orally the an orogastric tube. Also the patient was loaded with 3000 units IV heparin in order to facilitate revascularization. The the 8 [place] flu guide catheter was then retrieved and  removed. The 8 [place] Brite tip neurovascular sheath was then exchanged over a J-tip guidewire for a in 8 French 35 cm Brite tip neurovascular sheath. This was then connected to continuous heparinized saline infusion An arteriogram performed through this 3 5 cm 8 French Brite tip neurovascular sheath in the abdominal aorta reveal excellent flow through the common iliacs, the external and internal iliacs, and also the visualized portions of the common femoral arteries below the level of the inguinal ligaments. At the end of procedure, a CT of the brain revealed no evidence of gross intracranial hemorrhage,, or mass effect or midline shift. The patient was left intubated on account of his wound at the time of his intubation The right groin appeared soft without evidence of a hematoma. Distal pulses in the dorsalis pedis, and posterior regions remained dopplerable bilaterally. The patient was then transferred to the neuro ICU for further post stroke management. IMPRESSION: . 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 Approximately 90% stenosis of the dominant left vertebrobasilar junction distal to the origin of the left posterior-inferior cerebellar artery, and of approximately 70% of the proximal basilar artery. 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 PLAN: Follow-up in the clinic 1 month post discharge Electronically Signed   By: Julieanne CottonSanjeev  Deveshwar M.D.   On: 08/28/2018 16:08   Ct Cerebral Perfusion W Contrast  Result Date: 08/27/2018 CLINICAL DATA:  Right-sided weakness onset this morning. EXAM: CT PERFUSION BRAIN TECHNIQUE: Multiphase CT imaging of the brain was performed following IV bolus contrast injection. Subsequent parametric perfusion maps were calculated using RAPID software. CONTRAST:  40mL ISOVUE-370 IOPAMIDOL (ISOVUE-370)  INJECTION 76% COMPARISON:  CT a head today FINDINGS: CT Brain Perfusion Findings: CBF (<30%) Volume: 0mL Perfusion (Tmax>6.0s) volume: 155mL Mismatch Volume: 155mL ASPECTS on noncontrast CT Head today, not calculated due to extensive chronic ischemic change. No definite acute infarct on CT. Infarct Core: 0 mL Infarction Location:Delayed perfusion throughout the left MCA territory with apparent sparing of the basal ganglia. There is occlusion of the left internal carotid artery on CTA, age indeterminate. Left middle cerebral artery is diseased and may have delayed perfusion due to collateral circulation and stenosis. IMPRESSION: 155 mL of delayed perfusion in the left MCA territory. No core infarction. Delayed perfusion left MCA territory may be due to severe intracranial atherosclerotic disease as well as occlusion of the left internal carotid artery. Age of the internal carotid artery occlusion is indeterminate. It is possible this delayed perfusion is a chronic or possibly acute finding. Electronically Signed   By: Marlan Palauharles  Clark M.D.   On: 08/27/2018 16:12   Dg Chest Port 1 View  Result Date: 09/01/2018 CLINICAL DATA:  Respiratory failure. EXAM: PORTABLE CHEST 1 VIEW COMPARISON:  08/31/2018 FINDINGS: Endotracheal tube terminates 4.2 cm above the carina. Right jugular catheter terminates over the mid SVC. Enteric tube courses into the left upper abdomen.  The cardiac silhouette remains enlarged. Patchy airspace opacities throughout both lungs have not significantly changed. IMPRESSION: Bilateral airspace disease without significant interval change. Electronically Signed   By: Sebastian Ache M.D.   On: 09/01/2018 06:54   Dg Chest Port 1 View  Result Date: 08/31/2018 CLINICAL DATA:  CXR for Respiratory failure. Hx of HTN, stroke, AAA, Diabetes, and Cardiomyopathy. EXAM: PORTABLE CHEST 1 VIEW COMPARISON:  08/30/2018 FINDINGS: Endotracheal tube is in place, tip 3.8 centimeters above the carina. Nasogastric tube  is in place, tip beyond the gastroesophageal junction and off the image. A RIGHT IJ central line tip overlies the superior vena cava. Patient is rotated towards the LEFT. The heart is enlarged. There is persistent airspace filling opacity throughout the RIGHT lung, slightly improved. There is significant opacity in the LEFT lung base, consistent with atelectasis or consolidation and increased since 08/27/2018. IMPRESSION: 1. Slight improvement in aeration of the RIGHT lung. 2. Increased opacity in the LEFT lung base. Electronically Signed   By: Norva Pavlov M.D.   On: 08/31/2018 09:52   Dg Chest Port 1 View  Result Date: 08/30/2018 CLINICAL DATA:  Respiratory failure. Cardiomyopathy. EXAM: PORTABLE CHEST 1 VIEW COMPARISON:  08/29/2018 and 08/27/2018 FINDINGS: Endotracheal tube and NG tube and central line appear unchanged and in good position. There has been progression of the bilateral diffuse pulmonary infiltrates, particularly in the right upper lung zone of the left lung base. Heart size and pulmonary vascularity are normal. No discrete effusions. Aortic atherosclerosis. No acute bone abnormality. IMPRESSION: Progressive bilateral pulmonary infiltrates. Aortic Atherosclerosis (ICD10-I70.0). Electronically Signed   By: Francene Boyers M.D.   On: 08/30/2018 08:48   Dg Chest Port 1 View  Result Date: 08/29/2018 CLINICAL DATA:  Central line placement EXAM: PORTABLE CHEST 1 VIEW COMPARISON:  08/29/2018 FINDINGS: Right central line is been placed with the tip in the SVC. No pneumothorax. Endotracheal tube and NG tube are unchanged. Cardiomegaly with vascular congestion and mild pulmonary edema, stable. IMPRESSION: Right central line tip in the SVC. No pneumothorax. Continued mild CHF. Electronically Signed   By: Charlett Nose M.D.   On: 08/29/2018 11:58   Dg Chest Port 1 View  Result Date: 08/29/2018 CLINICAL DATA:  PT [facility] ED on 08/27/18 with complaints of right lower extremity weakness,  dyspnea and chest pressure. Hx of stroke, Peripheral vascualar disease, cardiomyopathy, and A-fib. EXAM: PORTABLE CHEST 1 VIEW COMPARISON:  08/27/2018 FINDINGS: Mild enlargement of the cardiac silhouette, stable. No mediastinal or hilar masses. There is central vascular congestion. Mild hazy opacity noted in the perihilar regions and medial lung bases bilaterally. No convincing pleural effusion and no pneumothorax. Endotracheal tube tip projects 3.8 cm above the Carina. Nasogastric tube passes below the diaphragm well into the stomach. IMPRESSION: 1. Findings are consistent with mild congestive heart failure with evidence of slight improvement when compared to 08/27/2018. No new abnormalities. 2. Support apparatus is well positioned. Electronically Signed   By: Amie Portland M.D.   On: 08/29/2018 07:26   Dg Chest Port 1 View  Result Date: 08/28/2018 CLINICAL DATA:  Shortness of Breath EXAM: PORTABLE CHEST 1 VIEW COMPARISON:  08/27/2018 FINDINGS: Cardiac shadow is enlarged in size. Aortic calcifications are noted. Endotracheal tube is noted at the level of the carina. This should be withdrawn 2-3 cm. Nasogastric catheter is noted within the stomach. Lungs are well aerated bilaterally with mild interstitial edema similar to that seen on the prior exam. IMPRESSION: Interval intubation with the endotracheal tube at the  level of the carina. This should be withdrawn 2-3 cm. Stable edema bilaterally. These results will be called to the ordering clinician or representative by the Radiologist Assistant, and communication documented in the PACS or zVision Dashboard. Electronically Signed   By: Alcide Clever M.D.   On: 08/28/2018 00:19   Dg Abd Portable 1v  Result Date: 08/28/2018 CLINICAL DATA:  Orogastric tube placement. EXAM: PORTABLE ABDOMEN - 1 VIEW COMPARISON:  None. FINDINGS: The patient's enteric tube is seen ending overlying the body of the stomach, with the side port about the fundus of the stomach. The  visualized bowel gas pattern is grossly unremarkable. A small left pleural effusion is noted. Increased interstitial markings raise concern for pulmonary edema. Vascular congestion is noted. The patient's endotracheal tube is seen ending 2-3 cm above the carina. No acute osseous abnormalities are identified. IMPRESSION: 1. Enteric tube seen ending overlying the body of the stomach, with the side port about the fundus of the stomach. 2. Small left pleural effusion noted. Increased interstitial markings raise concern for pulmonary edema. Electronically Signed   By: Roanna Raider M.D.   On: 08/28/2018 22:08   Ir Percutaneous Art Thrombectomy/infusion Intracranial Inc Diag Angio  Result Date: 08/31/2018 CLINICAL DATA:  New onset right-sided weakness with mild word-finding difficulties. 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 EXAM: IR ANGIO INTRA EXTRACRAN SEL COM CAROTID INNOMINATE UNI RIGHT MOD SED; IR PERCUTANEOUS ART THORMBECTOMY/INFUSION INTRACRANIAL INCLUDE DIAG ANGIO; IR ANGIO VERTEBRAL SEL SUBCLAVIAN INNOMINATE UNI LEFT MOD SED; IR CT HEAD LIMITED COMPARISON:  CT angiogram of the head and neck of 08/27/2018 MEDICATIONS: Heparin 3,000 units IV; . Ancef 2 g IV antibiotic was administered within 1 hour of the procedure ANESTHESIA/SEDATION: General anesthesia CONTRAST:  Isovue 300 approximately 100 mL FLUOROSCOPY TIME:  Fluoroscopy Time: 60 minutes 0 seconds (4327 mGy). COMPLICATIONS: None immediate. TECHNIQUE: Informed written consent was obtained from the patient after a thorough discussion of the procedural risks, benefits and alternatives. All questions were addressed. Maximal Sterile Barrier Technique was utilized including caps, mask, sterile gowns, sterile gloves, sterile drape, hand hygiene and skin antiseptic. A timeout was performed  prior to the initiation of the procedure. The right groin was prepped and draped in the usual sterile fashion. Thereafter using modified Seldinger technique, transfemoral access into the right common femoral artery was obtained without difficulty. Over a 0.035 inch guidewire, a 5 French Pinnacle sheath was inserted. Through this, and also over 0.035 inch guidewire, a 5 [place] JB 1 catheter was advanced to the aortic arch region and selectively positioned in the right common carotid artery, the innominate artery , the left common carotid artery and the left vertebral artery. FINDINGS: The left common carotid artery tear g demonstrates a moderate tortuosity of the proximal left common carotid artery The left external carotid artery proximally has a mild to moderate stenosis. Is branches are otherwise normally opacified The left internal carotid artery at the bulb would demonstrates the complete angiographic occlusion with no evidence of a string sign on the delayed arterial images. The innominate artery arteriogram demonstrates patency of the right subclavian artery and the right common carotid artery proximally There is no angiographic evidence of the right vertebral artery The right common carotid bifurcation demonstrates the right external carotid artery and its major branches to be widely patent The right internal carotid  artery just distal to the bulb has a severe 90% plus stenosis. Distal to this there is a U-shaped configuration of the right internal carotid artery. More distally the vessel is normal caliber The petrous the cavernous segments and supraclinoid segments are widely patent There is mild atherosclerotic irregularity involving the proximal cavernous, and distal cavernous portions of the right internal carotid artery. Right posterior communicating artery is seen opacifying the right posterior cerebral artery and the distal basilar artery. The right middle cerebral artery has approximately 30 40%  stenosis of the right M1 segment The trifurcation branches appear to be widely patent into the capillary and venous phases The right anterior cerebral artery A1 segment and distally demonstrate wide patency is into the capillary and venous phases There is simultaneous cross-filling via the anterior communicating artery of the left anterior cerebral artery A2 segment and distally. Also demonstrated is opacification of the left anterior cerebral A1 segment, with flow noted into the left middle cerebral artery distribution. Unopacified blood is seen in the left middle cerebral artery from the collateral circulation. 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 More distally the vessel is seen to opacify to the cranial skull base There is severe focal of focal stenosis of the left vertebrobasilar junction just distal to the origin of the left posterior-inferior cerebellar artery Additionally there is a approximately 70% stenosis of the proximal basilar artery More distally the basilar artery demonstrates mild caliber irregularity. There is a opacification of the left posterior cerebral artery the superior cerebellar arteries left greater than right, and the anterior-inferior cerebellar arteries Retrograde opacification of the right vertebrobasilar junction to the level of the right posterior-inferior cerebellar arteries is noted. Caliber irregularity of the right posterior-inferior cerebellar artery suggests intracranial arteriosclerosis. Left internal carotid artery occlusion or revascularization attempt The diagnostic JB 1 catheter left common carotid artery was then exchanged over a 0.035 inch 300 cm a 7 French Pinnacle sheath in the  right groin. A diagnostic JB 1 catheter were then advanced over the rise exchange guidewire to the descending thoracic aorta. The 7 French Pinnacle sheath was then exchanged over a 0.035 inch 100 cm Amplatz stiff guidewire for a a 8 French 55 cm Brite tip neurovascular sheath using biplane roadmap technique and constant fluoroscopic guidance. Good good aspiration was obtained from the side port of the neurovascular sheath. This was then connected to continuous heparinized saline infusion. The embolus guidewire was then removed. Through the 8 French neurovascular sheath, over a 0.035 inch roadrunner guidewire, a 5 5 [place] JB 1 catheter was advanced to the recover region and select position in the left common carotid artery. Using biplane technique and constant fluoroscopic guidance, over the of the 5 inch roadrunner guidewire, the 5 [place] JB 1 catheter was advanced to the left external carotid artery and exchanged over a 0.035 inch 300 cm rise exchange guidewire for an 8 French 85 cm flow gap balloon guide catheter which had been prepped with 50% contrast and 50% heparinized saline infusion The guidewire was removed. Good aspiration obtained from the hub of the 8 [place] Flo guide catheter. Gentle contrast injection demonstrated no evidence of spasms dissections or of intraluminal filling defects. At this time, in a coaxial manner and with constant heparinized saline  infusion, a tree approached 1 microcatheter was advanced over a point 0.014 inch soft tip synchro micro guidewire to the distal end of the Flo guide catheter The Flood guide catheter was advanced to just inside the left internal carotid artery bulb. Thereafter, multiple attempts were made to advance the microcatheter over the micro guidewire first with Ace synchro soft and then a regular synchro guidewire. In 016 headliner double angled micro guidewire was also utilized in order to access the occluded left internal carotid artery. It was noted that  there was a severe tortuosity at the junction of the proximal and the middle 1/3 of the left internal carotid artery Following multiple attempts it was decided to stop. A control was then performed through the [facility] guide catheter in the left common carotid artery which revealed brisk cross-filling of the left internal carotid artery distal cavernous and supraclinoid segments from the left external carotid artery branches via the ipsilateral ophthalmic artery Flow is noted into the left middle cerebral artery as well Also noted was a retrograde opacification from the left posterior communicating artery into the supraclinoid left ICA and then middle cerebral artery An earlier diagnostic catheter arteriogram of the right common carotid artery he had revealed partial cross-filling via the anterior communicating artery from the right internal carotid artery Given the the collateral circulation, it was decided to stop the procedure. Throughout the procedure, the patient's blood pressure and neurological status remained stable No evidence of hemodynamic instability was noted No gross mass-effect or move filling defects or extravasation was seen Prior to the attempted revascularization of the left internal carotid artery occlusion, the patient was given 180 mg of Brilinta, and 81 mg of aspirin orally the an orogastric tube. Also the patient was loaded with 3000 units IV heparin in order to facilitate revascularization. The the 8 [place] flu guide catheter was then retrieved and removed. The 8 [place] Brite tip neurovascular sheath was then exchanged over a J-tip guidewire for a in 8 French 35 cm Brite tip neurovascular sheath. This was then connected to continuous heparinized saline infusion An arteriogram performed through this 3 5 cm 8 French Brite tip neurovascular sheath in the abdominal aorta reveal excellent flow through the common iliacs, the external and internal iliacs, and also the visualized portions of the common  femoral arteries below the level of the inguinal ligaments. At the end of procedure, a CT of the brain revealed no evidence of gross intracranial hemorrhage,, or mass effect or midline shift. The patient was left intubated on account of his wound at the time of his intubation The right groin appeared soft without evidence of a hematoma. Distal pulses in the dorsalis pedis, and posterior regions remained dopplerable bilaterally. The patient was then transferred to the neuro ICU for further post stroke management. IMPRESSION: . 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 Approximately 90% stenosis of the dominant left vertebrobasilar junction distal to the origin of the left posterior-inferior cerebellar artery, and of approximately 70% of the proximal basilar artery. 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 PLAN: Follow-up in the clinic 1 month post discharge Electronically Signed   By: Julieanne Cotton M.D.   On: 08/28/2018 16:08   Ct Head Code Stroke Wo Contrast`  Result Date: 08/27/2018 CLINICAL DATA:  Code stroke. Slurred speech beginning 3 weeks ago. Right-sided weakness beginning today. EXAM: CT HEAD WITHOUT CONTRAST TECHNIQUE: Contiguous axial  images were obtained from the base of the skull through the vertex without intravenous contrast. COMPARISON:  CT 03/07/2013 FINDINGS: Brain: The brainstem and cerebellum appear normal by CT. Cerebral hemispheres show age related volume loss. There are old cortical and subcortical infarctions in the right frontal lobe, left parietal vertex, left frontal lobe, and left frontoparietal vertex. The show atrophy and encephalomalacia with gliosis. There is no finding of acute or subacute infarction on this CT. There is old lacunar infarction in the left caudate. Vascular: There is atherosclerotic  calcification of the major vessels at the base of the brain. No evidence of acute hyperdense vessel. Skull: Negative Sinuses/Orbits: Clear/normal Other: None ASPECTS ([place] Stroke Program Early CT Score) Aspects is difficult in this patient with old infarctions. I do not suspect an acute insult. IMPRESSION: 1. No acute finding by CT. 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 2. 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 No acute finding is suspected by CT. 3. These results were called by telephone at the time of interpretation on 08/27/2018 at 11:47 am to Dr. Daryel November , who verbally acknowledged these results. Electronically Signed   By: Paulina Fusi M.D.   On: 08/27/2018 11:49   Ir Angio Intra Extracran Sel Com Carotid Innominate Uni R Mod Sed  Result Date: 08/31/2018 CLINICAL DATA:  New onset right-sided weakness with mild word-finding difficulties. 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 EXAM: IR ANGIO INTRA EXTRACRAN SEL COM CAROTID INNOMINATE UNI RIGHT MOD SED; IR PERCUTANEOUS ART THORMBECTOMY/INFUSION INTRACRANIAL INCLUDE DIAG ANGIO; IR ANGIO VERTEBRAL SEL SUBCLAVIAN INNOMINATE UNI LEFT MOD SED; IR CT HEAD LIMITED COMPARISON:  CT angiogram of the head and neck of 08/27/2018 MEDICATIONS: Heparin 3,000 units IV; . Ancef 2 g IV antibiotic was administered within 1 hour of the procedure ANESTHESIA/SEDATION: General anesthesia CONTRAST:  Isovue 300 approximately 100 mL FLUOROSCOPY TIME:  Fluoroscopy Time: 60 minutes 0 seconds (4327 mGy). COMPLICATIONS: None immediate. TECHNIQUE: Informed written consent was obtained from the patient after a thorough discussion of the procedural risks, benefits and alternatives. All  questions were addressed. Maximal Sterile Barrier Technique was utilized including caps, mask, sterile gowns, sterile gloves, sterile drape, hand hygiene and skin antiseptic. A timeout was performed prior to the initiation of the procedure. The right groin was prepped and draped in the usual sterile fashion. Thereafter using modified Seldinger technique, transfemoral access into the right common femoral artery was obtained without difficulty. Over a 0.035 inch guidewire, a 5 French Pinnacle sheath was inserted. Through this, and also over 0.035 inch guidewire, a 5 [place] JB 1 catheter was advanced to the aortic arch region and selectively positioned in the right common carotid artery, the innominate artery , the left common carotid artery and the left vertebral artery. FINDINGS: The left common carotid artery tear g demonstrates a moderate tortuosity of the proximal left common carotid artery The left external carotid artery proximally has a mild to moderate stenosis. Is branches are otherwise normally opacified The left internal carotid artery at the bulb would demonstrates the complete angiographic occlusion with no evidence of a string sign on the delayed arterial images. The innominate artery arteriogram demonstrates patency of the right subclavian artery and the right common carotid artery proximally There is no angiographic evidence of  the right vertebral artery The right common carotid bifurcation demonstrates the right external carotid artery and its major branches to be widely patent The right internal carotid artery just distal to the bulb has a severe 90% plus stenosis. Distal to this there is a U-shaped configuration of the right internal carotid artery. More distally the vessel is normal caliber The petrous the cavernous segments and supraclinoid segments are widely patent There is mild atherosclerotic irregularity involving the proximal cavernous, and distal cavernous portions of the right internal  carotid artery. Right posterior communicating artery is seen opacifying the right posterior cerebral artery and the distal basilar artery. The right middle cerebral artery has approximately 30 40% stenosis of the right M1 segment The trifurcation branches appear to be widely patent into the capillary and venous phases The right anterior cerebral artery A1 segment and distally demonstrate wide patency is into the capillary and venous phases There is simultaneous cross-filling via the anterior communicating artery of the left anterior cerebral artery A2 segment and distally. Also demonstrated is opacification of the left anterior cerebral A1 segment, with flow noted into the left middle cerebral artery distribution. Unopacified blood is seen in the left middle cerebral artery from the collateral circulation. 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 More distally the vessel is seen to opacify to the cranial skull base There is severe focal of focal stenosis of the left vertebrobasilar junction just distal to the origin of the left posterior-inferior cerebellar artery Additionally there is a approximately 70% stenosis of the proximal basilar artery More distally the basilar artery demonstrates mild caliber irregularity. There is a opacification of the left posterior cerebral artery the superior cerebellar arteries left greater than right, and the anterior-inferior cerebellar arteries Retrograde opacification of the right vertebrobasilar junction to the level of the right posterior-inferior cerebellar arteries is noted. Caliber irregularity of the right posterior-inferior cerebellar artery suggests intracranial arteriosclerosis. Left  internal carotid artery occlusion or revascularization attempt The diagnostic JB 1 catheter left common carotid artery was then exchanged over a 0.035 inch 300 cm a 7 French Pinnacle sheath in the right groin. A diagnostic JB 1 catheter were then advanced over the rise exchange guidewire to the descending thoracic aorta. The 7 French Pinnacle sheath was then exchanged over a 0.035 inch 100 cm Amplatz stiff guidewire for a a 8 French 55 cm Brite tip neurovascular sheath using biplane roadmap technique and constant fluoroscopic guidance. Good good aspiration was obtained from the side port of the neurovascular sheath. This was then connected to continuous heparinized saline infusion. The embolus guidewire was then removed. Through the 8 French neurovascular sheath, over a 0.035 inch roadrunner guidewire, a 5 5 [place] JB 1 catheter was advanced to the recover region and select position in the left common carotid artery. Using biplane technique and constant fluoroscopic guidance, over the of the 5 inch roadrunner guidewire, the 5 [place] JB 1 catheter was advanced to the left external carotid artery and exchanged over a 0.035 inch 300 cm rise exchange guidewire for an 8 French 85 cm flow gap balloon guide catheter which had been prepped with 50% contrast and 50% heparinized saline infusion The guidewire was removed. Good aspiration obtained from the hub of the 8 [place] Flo guide  catheter. Gentle contrast injection demonstrated no evidence of spasms dissections or of intraluminal filling defects. At this time, in a coaxial manner and with constant heparinized saline infusion, a tree approached 1 microcatheter was advanced over a point 0.014 inch soft tip synchro micro guidewire to the distal end of the Flo guide catheter The Flood guide catheter was advanced to just inside the left internal carotid artery bulb. Thereafter, multiple attempts were made to advance the microcatheter over the micro guidewire first with Ace  synchro soft and then a regular synchro guidewire. In 016 headliner double angled micro guidewire was also utilized in order to access the occluded left internal carotid artery. It was noted that there was a severe tortuosity at the junction of the proximal and the middle 1/3 of the left internal carotid artery Following multiple attempts it was decided to stop. A control was then performed through the [facility] guide catheter in the left common carotid artery which revealed brisk cross-filling of the left internal carotid artery distal cavernous and supraclinoid segments from the left external carotid artery branches via the ipsilateral ophthalmic artery Flow is noted into the left middle cerebral artery as well Also noted was a retrograde opacification from the left posterior communicating artery into the supraclinoid left ICA and then middle cerebral artery An earlier diagnostic catheter arteriogram of the right common carotid artery he had revealed partial cross-filling via the anterior communicating artery from the right internal carotid artery Given the the collateral circulation, it was decided to stop the procedure. Throughout the procedure, the patient's blood pressure and neurological status remained stable No evidence of hemodynamic instability was noted No gross mass-effect or move filling defects or extravasation was seen Prior to the attempted revascularization of the left internal carotid artery occlusion, the patient was given 180 mg of Brilinta, and 81 mg of aspirin orally the an orogastric tube. Also the patient was loaded with 3000 units IV heparin in order to facilitate revascularization. The the 8 [place] flu guide catheter was then retrieved and removed. The 8 [place] Brite tip neurovascular sheath was then exchanged over a J-tip guidewire for a in 8 French 35 cm Brite tip neurovascular sheath. This was then connected to continuous heparinized saline infusion An arteriogram performed through this 3 5  cm 8 French Brite tip neurovascular sheath in the abdominal aorta reveal excellent flow through the common iliacs, the external and internal iliacs, and also the visualized portions of the common femoral arteries below the level of the inguinal ligaments. At the end of procedure, a CT of the brain revealed no evidence of gross intracranial hemorrhage,, or mass effect or midline shift. The patient was left intubated on account of his wound at the time of his intubation The right groin appeared soft without evidence of a hematoma. Distal pulses in the dorsalis pedis, and posterior regions remained dopplerable bilaterally. The patient was then transferred to the neuro ICU for further post stroke management. IMPRESSION: . 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 Approximately 90% stenosis of the dominant left vertebrobasilar junction distal to the origin of the left posterior-inferior cerebellar artery, and of approximately 70% of the proximal basilar artery. 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 PLAN: Follow-up in the clinic 1 month post discharge Electronically Signed   By: Julieanne Cotton M.D.   On: 08/28/2018 16:08   Ir Angio Vertebral Sel Subclavian Innominate  Uni L Mod Sed  Result Date: 08/31/2018 CLINICAL DATA:  New onset right-sided weakness with mild word-finding difficulties. 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 EXAM: IR ANGIO INTRA EXTRACRAN SEL COM CAROTID INNOMINATE UNI RIGHT MOD SED; IR PERCUTANEOUS ART THORMBECTOMY/INFUSION INTRACRANIAL INCLUDE DIAG ANGIO; IR ANGIO VERTEBRAL SEL SUBCLAVIAN INNOMINATE UNI LEFT MOD SED; IR CT HEAD LIMITED COMPARISON:  CT  angiogram of the head and neck of 08/27/2018 MEDICATIONS: Heparin 3,000 units IV; . Ancef 2 g IV antibiotic was administered within 1 hour of the procedure ANESTHESIA/SEDATION: General anesthesia CONTRAST:  Isovue 300 approximately 100 mL FLUOROSCOPY TIME:  Fluoroscopy Time: 60 minutes 0 seconds (4327 mGy). COMPLICATIONS: None immediate. TECHNIQUE: Informed written consent was obtained from the patient after a thorough discussion of the procedural risks, benefits and alternatives. All questions were addressed. Maximal Sterile Barrier Technique was utilized including caps, mask, sterile gowns, sterile gloves, sterile drape, hand hygiene and skin antiseptic. A timeout was performed prior to the initiation of the procedure. The right groin was prepped and draped in the usual sterile fashion. Thereafter using modified Seldinger technique, transfemoral access into the right common femoral artery was obtained without difficulty. Over a 0.035 inch guidewire, a 5 French Pinnacle sheath was inserted. Through this, and also over 0.035 inch guidewire, a 5 [place] JB 1 catheter was advanced to the aortic arch region and selectively positioned in the right common carotid artery, the innominate artery , the left common carotid artery and the left vertebral artery. FINDINGS: The left common carotid artery tear g demonstrates a moderate tortuosity of the proximal left common carotid artery The left external carotid artery proximally has a mild to moderate stenosis. Is branches are otherwise normally opacified The left internal carotid artery at the bulb would demonstrates the complete angiographic occlusion with no evidence of a string sign on the delayed arterial images. The innominate artery arteriogram demonstrates patency of the right subclavian artery and the right common carotid artery proximally There is no angiographic evidence of the right vertebral artery The right common carotid bifurcation demonstrates the right external  carotid artery and its major branches to be widely patent The right internal carotid artery just distal to the bulb has a severe 90% plus stenosis. Distal to this there is a U-shaped configuration of the right internal carotid artery. More distally the vessel is normal caliber The petrous the cavernous segments and supraclinoid segments are widely patent There is mild atherosclerotic irregularity involving the proximal cavernous, and distal cavernous portions of the right internal carotid artery. Right posterior communicating artery is seen opacifying the right posterior cerebral artery and the distal basilar artery. The right middle cerebral artery has approximately 30 40% stenosis of the right M1 segment The trifurcation branches appear to be widely patent into the capillary and venous phases The right anterior cerebral artery A1 segment and distally demonstrate wide patency is into the capillary and venous phases There is simultaneous cross-filling via the anterior communicating artery of the left anterior cerebral artery A2 segment and distally. Also demonstrated is opacification of the left anterior cerebral A1 segment, with flow noted into the left middle cerebral artery distribution. Unopacified blood is seen in the left middle cerebral artery from the collateral circulation. The left subclavian arteriogram demonstrates mild stenosis at the origin of the right subclavian artery Mild atherosclerotic disease is  noted of the subclavian artery and in the region of the thyrocervical trunk The dominant left vertebral artery origin is widely patent The vessel demonstrates mild tortuosity just distal to this. More distally the vessel is seen to opacify to the cranial skull base There is severe focal of focal stenosis of the left vertebrobasilar junction just distal to the origin of the left posterior-inferior cerebellar artery Additionally there is a approximately 70% stenosis of the proximal basilar artery More  distally the basilar artery demonstrates mild caliber irregularity. There is a opacification of the left posterior cerebral artery the superior cerebellar arteries left greater than right, and the anterior-inferior cerebellar arteries Retrograde opacification of the right vertebrobasilar junction to the level of the right posterior-inferior cerebellar arteries is noted. Caliber irregularity of the right posterior-inferior cerebellar artery suggests intracranial arteriosclerosis. Left internal carotid artery occlusion or revascularization attempt The diagnostic JB 1 catheter left common carotid artery was then exchanged over a 0.035 inch 300 cm a 7 French Pinnacle sheath in the right groin. A diagnostic JB 1 catheter were then advanced over the rise exchange guidewire to the descending thoracic aorta. The 7 French Pinnacle sheath was then exchanged over a 0.035 inch 100 cm Amplatz stiff guidewire for a a 8 French 55 cm Brite tip neurovascular sheath using biplane roadmap technique and constant fluoroscopic guidance. Good good aspiration was obtained from the side port of the neurovascular sheath. This was then connected to continuous heparinized saline infusion. The embolus guidewire was then removed. Through the 8 French neurovascular sheath, over a 0.035 inch roadrunner guidewire, a 5 5 [place] JB 1 catheter was advanced to the recover region and select position in the left common carotid artery. Using biplane technique and constant fluoroscopic guidance, over the of the 5 inch roadrunner guidewire, the 5 [place] JB 1 catheter was advanced to the left external carotid artery and exchanged over a 0.035 inch 300 cm rise exchange guidewire for an 8 French 85 cm flow gap balloon guide catheter which had been prepped with 50% contrast and 50% heparinized saline infusion The guidewire was removed. Good aspiration obtained from the hub of the 8 [place] Flo guide catheter. Gentle contrast injection demonstrated no evidence of  spasms dissections or of intraluminal filling defects. At this time, in a coaxial manner and with constant heparinized saline infusion, a tree approached 1 microcatheter was advanced over a point 0.014 inch soft tip synchro micro guidewire to the distal end of the Flo guide catheter The Flood guide catheter was advanced to just inside the left internal carotid artery bulb. Thereafter, multiple attempts were made to advance the microcatheter over the micro guidewire first with Ace synchro soft and then a regular synchro guidewire. In 016 headliner double angled micro guidewire was also utilized in order to access the occluded left internal carotid artery. It was noted that there was a severe tortuosity at the junction of the proximal and the middle 1/3 of the left internal carotid artery Following multiple attempts it was decided to stop. A control was then performed through the [facility] guide catheter in the left common carotid artery which revealed brisk cross-filling of the left internal carotid artery distal cavernous and supraclinoid segments from the left external carotid artery branches via the ipsilateral ophthalmic artery Flow is noted into the left middle cerebral artery as well Also noted was a retrograde opacification from the left posterior communicating artery into the supraclinoid left ICA and then middle cerebral artery An earlier diagnostic catheter arteriogram of  the right common carotid artery he had revealed partial cross-filling via the anterior communicating artery from the right internal carotid artery Given the the collateral circulation, it was decided to stop the procedure. Throughout the procedure, the patient's blood pressure and neurological status remained stable No evidence of hemodynamic instability was noted No gross mass-effect or move filling defects or extravasation was seen Prior to the attempted revascularization of the left internal carotid artery occlusion, the patient was given 180  mg of Brilinta, and 81 mg of aspirin orally the an orogastric tube. Also the patient was loaded with 3000 units IV heparin in order to facilitate revascularization. The the 8 [place] flu guide catheter was then retrieved and removed. The 8 [place] Brite tip neurovascular sheath was then exchanged over a J-tip guidewire for a in 8 French 35 cm Brite tip neurovascular sheath. This was then connected to continuous heparinized saline infusion An arteriogram performed through this 3 5 cm 8 French Brite tip neurovascular sheath in the abdominal aorta reveal excellent flow through the common iliacs, the external and internal iliacs, and also the visualized portions of the common femoral arteries below the level of the inguinal ligaments. At the end of procedure, a CT of the brain revealed no evidence of gross intracranial hemorrhage,, or mass effect or midline shift. The patient was left intubated on account of his wound at the time of his intubation The right groin appeared soft without evidence of a hematoma. Distal pulses in the dorsalis pedis, and posterior regions remained dopplerable bilaterally. The patient was then transferred to the neuro ICU for further post stroke management. IMPRESSION: . 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 Approximately 90% stenosis of the dominant left vertebrobasilar junction distal to the origin of the left posterior-inferior cerebellar artery, and of approximately 70% of the proximal basilar artery. 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 PLAN: Follow-up in the clinic 1 month post discharge Electronically Signed   By: Julieanne Cotton M.D.   On: 08/28/2018 16:08    PHYSICAL EXAM  Temp:  [98.4 F (36.9 C)-100.6 F (38.1 C)] 100.6 F (38.1 C) (12/30 0800) Pulse Rate:  [44-160] 90 (12/30 1030) Resp:  [14-31]  26 (12/30 1030) BP: (85-145)/(47-102) 142/90 (12/30 1030) SpO2:  [91 %-100 %] 95 % (12/30 1030) FiO2 (%):  [40 %] 40 % (12/30 0859) Weight:  [145.7 kg] 145.7 kg (12/30 0500)  General - Well nourished, well developed, self extubated, in mild respiratory distress.  Diffuse anasarca  Ophthalmologic - fundi not visualized due to noncooperation.  Cardiovascular - irregularly irregular heart rate and rhythm.  Neuro - self extubated, in mild respiratory distress, eyes open, able to close eyes and wiggle left toes on command, however not following other commands.  Left gaze preference, not able to cross midline, able to track objects on the left visual field, inconsistent blinking to visual threat on the left, but not on the right.  PERRL.  Facial symmetric.  Tongue protrusion not corporative.  Left upper extremity on restrain, but 4/5 on bicep and finger movement.  Right lower extremity and right upper extremity no movement on pain stimulation.  Left lower extremity proximal 2/5 and distal 3/5.  DTR diminished, Babinski negative.  Sensation, coordination and gait not tested.   ASSESSMENT/PLAN Darren Frazier a 77 y.o.malewith history of HTN, atrial fibrillation on Pradaxa last dose last evening, urethral stricture, cardiomyopathy,  peripheral vascular disease, hypertension, hyperlipidemia, stroke with some balance issues, bilateral lymphedema vitamin D deficiencypresenting with right lower extremity numbness and weaknessand suspected new Lt ICA occlusion.Hedid not receive IV t-PA due to late presentation.  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  MRI head -left MCA and ACA territory scattered small infarcts  CTA H&N - left ICA, right VA occlusion.  Right ICA 80%  stenosis and bilateral siphon stenosis.  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  2D Echo - EF 60 to 65%  LDL- 81  HgbA1c- 7.1  VTE prophylaxis -SCDs  aspirin 81 mg daily and Pradaxa (dabigatran) twice a dayprior to admission, now on ASA 325mg .  Plan for DOAC today if p.o. access, or heparin IV if no p.o. access.  Ongoing aggressive stroke risk factor management  Therapy recommendations: pending  Disposition: Pending  Aspiration pneumonitis  Fever Tmax 101.2->101.3-> afebrile  Leukocytosis WBC 22.2-15.5-11.4-13.9  CXR unchanged bilateral pulmonary infiltrates  Tube feeding on hold  Sputum culture normal flora  Off Zosyn today  CCM on board  Respiratory failure  Intubated on ventilation  On sedation with precedex and fentanyl  CCM on board  Atrial fibrillation, chronic  On Pradaxa PTA, compliant with medication  Currently on aspirin  Plan for DOAC today if p.o. access, or heparin IV if no p.o. access.  Rate controlled, with intermittent RVR with agitation  Hypotension History of hypertension  BP running lowon phenyephrine  Avoid hypotension due to 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 as well as basal artery stenosis  BP goal 110-160  Hyperlipidemia  Lipid lowering medication ZOX:WRUE  LDL81, goal < 70  Current lipid lowering medication:none (statin intolerant - abnormal labs)  Diabetes  HgbA1c7.1, goal < 7.0  Uncontrolled  SSI  CBG monitoring  On Levemir  Other Stroke Risk Factors  Advanced age  Former cigarette smoker - quit  Obesity,Body mass index is 47.79 kg/m., recommend weight loss, diet and exercise as appropriate   Hx stroke/TIA  PVD  Cardiomyopathy  Other Active Problems  Urethral stricture -on Foley  Diffuse  anasarca  Hospital day # 5  This patient is critically ill due to left MCA and ACA infarct, bilateral ICA occlusions/stenosis, A. fib, respiratory failure, aspiration pneumonia, fever and at significant risk of neurological worsening, death form recurrent stroke, hemorrhagic conversion, seizure, heart failure, septic shock, sepsis. This patient's care requires constant monitoring of vital signs, hemodynamics, respiratory and cardiac monitoring, review of multiple databases, neurological assessment, discussion with family, other specialists and medical decision making of high complexity. I spent 40 minutes of neurocritical care time in the care of this patient. I had long discussion with wife and son at bedside, updated pt current condition, treatment plan and potential prognosis. They expressed understanding and appreciation.  I also discussed with Dr. Denese Killings at bedside.     Marvel Plan, MD PhD Stroke Neurology 09/01/2018 11:05 AM     To contact Stroke Continuity provider, please refer to [email or web address]. After hours, contact General Neurology

## 2018-09-01 NOTE — Progress Notes (Addendum)
ANTICOAGULATION CONSULT NOTE - Initial Consult  Pharmacy Consult for heparin Indication: atrial fibrillation  Allergies  Allergen Reactions  . Ace Inhibitors Other (See Comments)    unknown  . Omeprazole Diarrhea  . Statins Other (See Comments)    Abnormal lab results(pt unsure of what labs)    Patient Measurements: Height: _0  (175.3 cm) Weight: (!) 321 lb 3.4 oz (145.7 kg) IBW/kg (Calculated) : 70.7 Heparin Dosing Weight: 105.9 kg  Vital Signs: Temp: 98.4 F (36.9 C) (12/30 1700) Temp Source: Oral (12/30 1700) BP: 136/72 (12/30 1915) Pulse Rate: 109 (12/30 1915)  Labs: Recent Labs    08/30/18 0545 08/30/18 0655 08/31/18 0517 08/31/18 1027 09/01/18 0046  HGB 10.3*  --  9.3*  --  9.3*  HCT 31.9*  --  29.4*  --  29.9*  PLT 251  --  227  --  269  CREATININE  --  1.18  --  1.23 1.27*  TROPONINI  --   --   --  12.47*  --     Estimated Creatinine Clearance: 69.4 mL/min (A) (by C-G formula based on SCr of 1.27 mg/dL (H)).   Medical History: Past Medical History:  Diagnosis Date  . Cardiomyopathy ([place])   . Diabetes mellitus without complication ([place])   . Dysrhythmia    atrial fibrillation  . GERD (gastroesophageal reflux disease)   . H/O: urethral stricture    self caths occasionally  . History of abdominal aortic aneurysm (AAA)    30/59m in diameter, stable  . History of kidney stones 2017  . Hyperlipidemia   . Hypertension   . Hypothyroidism   . Morbid obesity with BMI of 40.0-44.9, adult ([place] 2019  . Peripheral vascular disease ([place] 2019   lymphadema both extremities, ulceration on left big toe  . Stroke ([place] 2011   x 2 within 6 months. some numbness over upper arms, balance off  . Tremors of nervous system   . Vitamin D deficiency     Medications:  Scheduled:  . amiodarone  150 mg Intravenous Once  . aspirin EC  325 mg Oral Daily  . chlorhexidine gluconate (MEDLINE KIT)  15 mL Mouth Rinse BID  . Chlorhexidine Gluconate Cloth  6 each  Topical Q0600  . cholecalciferol  1,000 Units Per Tube Daily  . feeding supplement (PRO-STAT SUGAR FREE 64)  30 mL Oral BID  . feeding supplement (VITAL HIGH PROTEIN)  1,000 mL Per Tube QID  . insulin aspart  0-20 Units Subcutaneous Q4H  . insulin detemir  10 Units Subcutaneous Daily  . levothyroxine  44 mcg Intravenous Daily  . mouth rinse  15 mL Mouth Rinse 10 times per day  . 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 Found to have Afib RVR on 12/26.   Hgb stable at 9.3, s/p 1 PRBCs on 12/27; plt 269. No s/sx of bleeding - groin bleed stabilized.   Goal of Therapy:  Heparin level 0.3-0.7 units/ml Monitor platelets by anticoagulation protocol: Yes   Plan:  Give 3000 units bolus x 1 Start heparin infusion at 1500 units/hr Check anti-Xa level in 6 hours and daily while on heparin Continue to monitor H&H and platelets  KAntonietta Jewel PharmD, BBraveClinical Pharmacist  Pager: [phone]: [phone]/30/2019,7:29 PM

## 2018-09-02 ENCOUNTER — Inpatient Hospital Stay (HOSPITAL_COMMUNITY): Payer: Medicare HMO

## 2018-09-02 DIAGNOSIS — R1312 Dysphagia, oropharyngeal phase: Secondary | ICD-10-CM

## 2018-09-02 DIAGNOSIS — I5031 Acute diastolic (congestive) heart failure: Secondary | ICD-10-CM

## 2018-09-02 DIAGNOSIS — R0603 Acute respiratory distress: Secondary | ICD-10-CM

## 2018-09-02 LAB — URINALYSIS, COMPLETE (UACMP) WITH MICROSCOPIC
Bilirubin Urine: NEGATIVE
Glucose, UA: NEGATIVE mg/dL
KETONES UR: NEGATIVE mg/dL
Leukocytes, UA: NEGATIVE
Nitrite: NEGATIVE
Protein, ur: NEGATIVE mg/dL
Specific Gravity, Urine: 1.01 (ref 1.005–1.030)
pH: 5 (ref 5.0–8.0)

## 2018-09-02 LAB — GLUCOSE, CAPILLARY
Glucose-Capillary: 120 mg/dL — ABNORMAL HIGH (ref 70–99)
Glucose-Capillary: 122 mg/dL — ABNORMAL HIGH (ref 70–99)
Glucose-Capillary: 142 mg/dL — ABNORMAL HIGH (ref 70–99)
Glucose-Capillary: 147 mg/dL — ABNORMAL HIGH (ref 70–99)
Glucose-Capillary: 142 mg/dL — ABNORMAL HIGH (ref 70–99)
Glucose-Capillary: 117 mg/dL — ABNORMAL HIGH (ref 70–99)
Glucose-Capillary: 113 mg/dL — ABNORMAL HIGH (ref 70–99)

## 2018-09-02 LAB — POCT I-STAT 3, ART BLOOD GAS (G3+)
Acid-base deficit: 3 mmol/L — ABNORMAL HIGH (ref 0.0–2.0)
Bicarbonate: 21.7 mmol/L (ref 20.0–28.0)
O2 SAT: 100 %
PCO2 ART: 35.4 mmHg (ref 32.0–48.0)
Patient temperature: 98.6
TCO2: 23 mmol/L (ref 22–32)
pH, Arterial: 7.396 (ref 7.350–7.450)
pO2, Arterial: 169 mmHg — ABNORMAL HIGH (ref 83.0–108.0)

## 2018-09-02 LAB — HEPARIN LEVEL (UNFRACTIONATED)
Heparin Unfractionated: 0.32 IU/mL (ref 0.30–0.70)
Heparin Unfractionated: <0.1 IU/mL — ABNORMAL LOW (ref 0.30–0.70)

## 2018-09-02 LAB — CBC
HEMATOCRIT: 29.3 % — AB (ref 39.0–52.0)
Hemoglobin: 9.4 g/dL — ABNORMAL LOW (ref 13.0–17.0)
MCH: 32.6 pg (ref 26.0–34.0)
MCHC: 32.1 g/dL (ref 30.0–36.0)
MCV: 101.7 fL — ABNORMAL HIGH (ref 80.0–100.0)
Platelets: 322 10*3/uL (ref 150–400)
RBC: 2.88 MIL/uL — ABNORMAL LOW (ref 4.22–5.81)
RDW: 14.6 % (ref 11.5–15.5)
WBC: 16.6 10*3/uL — ABNORMAL HIGH (ref 4.0–10.5)
nRBC: 0.9 % — ABNORMAL HIGH (ref 0.0–0.2)

## 2018-09-02 MED ORDER — BISACODYL 10 MG RE SUPP
10.0000 mg | Freq: Once | RECTAL | Status: AC
  Administered 2018-09-02: 10 mg via RECTAL
  Filled 2018-09-02: qty 1

## 2018-09-02 MED ORDER — FUROSEMIDE 10 MG/ML IJ SOLN
60.0000 mg | Freq: Two times a day (BID) | INTRAMUSCULAR | Status: AC
  Administered 2018-09-02 – 2018-09-03 (×4): 60 mg via INTRAVENOUS
  Filled 2018-09-02 (×5): qty 6

## 2018-09-02 MED ORDER — ORAL CARE MOUTH RINSE
15.0000 mL | Freq: Two times a day (BID) | OROMUCOSAL | Status: DC
  Administered 2018-09-02 – 2018-09-03 (×3): 15 mL via OROMUCOSAL

## 2018-09-02 MED ORDER — HEPARIN BOLUS VIA INFUSION
3000.0000 [IU] | Freq: Once | INTRAVENOUS | Status: DC
  Filled 2018-09-02: qty 3000

## 2018-09-02 MED ORDER — ASPIRIN 300 MG RE SUPP
300.0000 mg | Freq: Once | RECTAL | Status: AC
  Administered 2018-09-02: 300 mg via RECTAL
  Filled 2018-09-02: qty 1

## 2018-09-02 MED ORDER — HEPARIN BOLUS VIA INFUSION
3000.0000 [IU] | Freq: Once | INTRAVENOUS | Status: AC
  Administered 2018-09-02: 3000 [IU] via INTRAVENOUS
  Filled 2018-09-02: qty 3000

## 2018-09-02 MED ORDER — SODIUM CHLORIDE 0.9 % IV SOLN
0.0000 ug/min | INTRAVENOUS | Status: DC
  Filled 2018-09-02: qty 4

## 2018-09-02 NOTE — Care Management Note (Signed)
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  PTA, pt independent, lives with spouse.                    Action/Plan: Pt self extubated on 09/01/18, and is tolerating well.  Recommend PT/OT consults when able to tolerate therapies.    Expected Discharge Date:                  Expected Discharge Plan:     In-House Referral:     Discharge planning Services  CM Consult  Post Acute Care Choice:    Choice offered to:     DME Arranged:    DME Agency:     HH Arranged:    HH Agency:     Status of Service:  In process, will continue to follow  If discussed at Long Length of Stay Meetings, dates discussed:    Additional Comments:  Quintella Baton, RN, BSN  Trauma/Neuro ICU Case Manager [phone]

## 2018-09-02 NOTE — Evaluation (Signed)
Clinical/Bedside Swallow Evaluation Patient Details  Name: Darren Frazier MRN: [id number] Date of Birth: 1941/04/24  Today's Date: 09/02/2018 Time: SLP Start Time (ACUTE ONLY): 1320 SLP Stop Time (ACUTE ONLY): 1333 SLP Time Calculation (min) (ACUTE ONLY): 13 min  Past Medical History:  Past Medical History:  Diagnosis Date  . Cardiomyopathy (HCC)   . Diabetes mellitus without complication (HCC)   . Dysrhythmia    atrial fibrillation  . GERD (gastroesophageal reflux disease)   . H/O: urethral stricture    self caths occasionally  . History of abdominal aortic aneurysm (AAA)    30/70mm in diameter, stable  . History of kidney stones 2017  . Hyperlipidemia   . Hypertension   . Hypothyroidism   . Morbid obesity with BMI of 40.0-44.9, adult (HCC) 2019  . Peripheral vascular disease (HCC) 2019   lymphadema both extremities, ulceration on left big toe  . Stroke (HCC) 2011   x 2 within 6 months. some numbness over upper arms, balance off  . Tremors of nervous system   . Vitamin D deficiency    Past Surgical History:  Past Surgical History:  Procedure Laterality Date  . APPENDECTOMY  1961  . CATARACT EXTRACTION, BILATERAL Bilateral   . CENTRAL LINE  08/29/2018      . COLONOSCOPY    . IR ANGIO INTRA EXTRACRAN SEL COM CAROTID INNOMINATE UNI R MOD SED  08/27/2018  . IR ANGIO VERTEBRAL SEL SUBCLAVIAN INNOMINATE UNI L MOD SED  08/27/2018  . IR CT HEAD LTD  08/27/2018  . IR PERCUTANEOUS ART THROMBECTOMY/INFUSION INTRACRANIAL INC DIAG ANGIO  08/27/2018  . LOWER EXTREMITY ANGIOGRAPHY Left 03/05/2018   Procedure: LOWER EXTREMITY ANGIOGRAPHY;  Surgeon: Darren Dills, MD;  Location: [facility] INVASIVE CV LAB;  Service: Cardiovascular;  Laterality: Left;  . RADIOLOGY WITH ANESTHESIA N/A 08/27/2018   Procedure: IR WITH ANESTHESIA;  Surgeon: Radiologist, Medication, MD;  Location: [facility] OR;  Service: Radiology;  Laterality: N/A;  . TONSILLECTOMY     HPI:  77 yo male presented to [facility]  12/25 with Rt leg numbness and weakness.  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  He was transferred to [facility].  He was intubated for neuro-IR intervention 12/25, self-extubated 12/30. MRI head left MCA/ACA territory scattered small acute infarcts; multiple scattered chronic underlying cortical infarcts involving the bilateral cerebral and right cerebellar hemispheres; moderately advanced cerebral atrophy with chronic small vessel.    Assessment / Plan / Recommendation Clinical Impression  Pt presents with generalized dysphagia due to combined effects of neurological infarcts, prolonged intubation with self-extubation, and multifactorial delirium.  Pt intermittently follows basic commands; oriented to self only; left gaze preference.  No focal cranial nerve deficits with +symmetry of face, tongue on extension.  Unreliable responses to sensory assessment. Pt demonstrates adequate anticipation/recognition of approaching trial boluses; masticates/manipulates with intention; activates a swallow response, but this is consistently followed by coughing/wet congestion at level of pharynx.  Demonstrates inconsistent coordination of respiratory/swallow effort.  Pt is a high aspiration risk - not ready for POs. D/W wife, Darren Frazier.  Continue NPO; SLP will follow for improved clinical behaviors and readiness; anticipate steady improvement.  SLP Visit Diagnosis: Dysphagia, unspecified (R13.10)    Aspiration Risk       Diet Recommendation   npo       Other  Recommendations Oral Care Recommendations: Oral care QID   Follow up Recommendations        Frequency and Duration min 3x week  2 weeks  Prognosis Prognosis for Safe Diet Advancement: Good      Swallow Study   General Date of Onset: 08/27/18 HPI: 77 [facility] with Rt leg numbness and weakness.  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  [facility]  He was intubated for neuro-IR intervention 12/25, self-extubated 12/30. MRI head  left MCA/ACA territory scattered small acute infarcts; multiple scattered chronic underlying cortical infarcts involving the bilateral cerebral and right cerebellar hemispheres; moderately advanced cerebral atrophy with chronic small vessel.  Type of Study: Bedside Swallow Evaluation Previous Swallow Assessment: no Diet Prior to this Study: NPO Temperature Spikes Noted: No Respiratory Status: Nasal cannula History of Recent Intubation: Yes Length of Intubations (days): 5 days Date extubated: 09/01/18 Behavior/Cognition: Alert;Confused Oral Cavity Assessment: Within Functional Limits Oral Care Completed by SLP: Recent completion by staff Oral Cavity - Dentition: Adequate natural dentition Self-Feeding Abilities: Total assist Patient Positioning: Upright in bed Baseline Vocal Quality: Low vocal intensity Volitional Cough: Strong Volitional Swallow: Able to elicit    Oral/Motor/Sensory Function Overall Oral Motor/Sensory Function: Within functional limits   Ice Chips Ice chips: Impaired Presentation: Spoon Pharyngeal Phase Impairments: Cough - Immediate   Thin Liquid Thin Liquid: Impaired Presentation: Spoon Pharyngeal  Phase Impairments: Cough - Immediate    Nectar Thick Nectar Thick Liquid: Not tested   Honey Thick Honey Thick Liquid: Not tested   Puree Puree: Not tested   Solid     Solid: Not tested      Darren Mountsouture, Darren Frazier 09/02/2018,1:45 PM   Darren FolksAmanda L. Samson Fredericouture, MA CCC/SLP Acute Rehabilitation Services Office number [phone] Pager [phone]

## 2018-09-02 NOTE — Progress Notes (Signed)
ANTICOAGULATION CONSULT NOTE   Pharmacy Consult for heparin Indication: atrial fibrillation  Allergies  Allergen Reactions  . Ace Inhibitors Other (See Comments)    unknown  . Omeprazole Diarrhea  . Statins Other (See Comments)    Abnormal lab results(pt unsure of what labs)    Patient Measurements: Height: 5\' 9"  (175.3 cm) Weight: (!) 321 lb 3.4 oz (145.7 kg) IBW/kg (Calculated) : 70.7 Heparin Dosing Weight: 105.9 kg  Vital Signs: Temp: 99 F (37.2 C) (12/31 0400) Temp Source: Axillary (12/31 0400) BP: 146/81 (12/31 0400) Pulse Rate: 111 (12/31 0400)  Labs: Recent Labs    08/30/18 0545 08/30/18 0655 08/31/18 0517 08/31/18 1027 09/01/18 0046 09/02/18 0320 09/02/18 0410  HGB 10.3*  --  9.3*  --  9.3*  --   --   HCT 31.9*  --  29.4*  --  29.9*  --   --   PLT 251  --  227  --  269  --   --   HEPARINUNFRC  --   --   --   --   --  SPECIMEN HEMOLYZED. HEMOLYSIS MAY AFFECT INTEGRITY OF RESULTS. <0.10*  CREATININE  --  1.18  --  1.23 1.27*  --   --   TROPONINI  --   --   --  12.47*  --   --   --     Estimated Creatinine Clearance: 69.4 mL/min (A) (by C-G formula based on SCr of 1.27 mg/dL (H)).  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 Found to have Afib RVR on 12/26.   Hgb stable at 9.3, s/p 1 PRBCs on 12/27; plt 269. No s/sx of bleeding - groin bleed stabilized.  Initial heparin level <0.1 units/ml.  No issues noted with infusion  Goal of Therapy:  Heparin level 0.3-0.7 units/ml Monitor platelets by anticoagulation protocol: Yes   Plan:  Give 3000 units bolus x 1 Increase heparin infusion to 1900 units/hr Check anti-Xa level in 6 hours and daily while on heparin Continue to monitor H&H and platelets  Thanks for allowing pharmacy to be a part of this patient's care.  Talbert Cage, PharmD Clinical Pharmacist

## 2018-09-02 NOTE — Progress Notes (Signed)
Progress Note  Patient Name: Darren Frazier Date of Encounter: 09/02/2018  Primary Cardiologist:   No primary care provider on file.   Subjective   Eyes open.  Verbal but not lucid.   Inpatient Medications    Scheduled Meds: . aspirin EC  325 mg Oral Daily  . bisacodyl  10 mg Rectal Once  . chlorhexidine gluconate (MEDLINE KIT)  15 mL Mouth Rinse BID  . cholecalciferol  1,000 Units Per Tube Daily  . feeding supplement (PRO-STAT SUGAR FREE 64)  30 mL Oral BID  . feeding supplement (VITAL HIGH PROTEIN)  1,000 mL Per Tube QID  . furosemide  60 mg Intravenous Q12H  . insulin aspart  0-20 Units Subcutaneous Q4H  . insulin detemir  10 Units Subcutaneous Daily  . levothyroxine  44 mcg Intravenous Daily  . mouth rinse  15 mL Mouth Rinse BID   Continuous Infusions: . sodium chloride 50 mL/hr at 09/02/18 1100  . amiodarone 30 mg/hr (09/02/18 1100)  . heparin 1,900 Units/hr (09/02/18 1100)  . phenylephrine 42m/250mL (0.162mmL) infusion     PRN Meds: acetaminophen **OR** acetaminophen (TYLENOL) oral liquid 160 mg/5 mL **OR** acetaminophen, metoprolol tartrate, ondansetron (ZOFRAN) IV, polyvinyl alcohol, senna-docusate   Vital Signs      Vitals:   09/02/18 1000 09/02/18 1100 09/02/18 1105 09/02/18 1200  BP: (!) 151/100 (!) 171/154 140/71   Pulse: 93 89 (!) 40   Resp: (!) 27 (!) 24 (!) 26   Temp:    (!) 97.5 F (36.4 C)  TempSrc:    Axillary  SpO2: 97% 98% 97%   Weight:      Height:       Physical Exam   GEN: No  acute distress.   Neck: No  JVD Cardiac: Irregular RR, no murmurs, rubs, or gallops.  Respiratory:   Decreased breath sounds GI: Soft, nontender, non-distended, normal bowel sounds  MS:  Moderate diffuse edema; No deformity. Neuro:     Right hemiparesis. Psych:  Unable to assess.     Intake/Output Summary (Last 24 hours) at 09/02/2018 1239 Last data filed at 09/02/2018 1100 Gross per 24 hour  Intake 1805.4 ml  Output 5179 ml  Net -3373.6 ml    Filed Weights   08/31/18 0500 09/01/18 0500 09/02/18 0500  Weight: (!) 143.5 kg (!) 145.7 kg 129.8 kg    Telemetry    Atrial fib.  Rate currently controlled. .   - Personally Reviewed  ECG    NA  - Personally Reviewed  Labs    Chemistry Recent Labs  Lab 08/30/18 0655 08/31/18 1027 09/01/18 0046  NA 137 142 143  K 4.1 4.1 4.4  CL 107 114* 114*  CO2 19* 21* 21*  GLUCOSE 191* 146* 162*  BUN 18 22 26*  CREATININE 1.18 1.23 1.27*  CALCIUM 7.6* 7.8* 8.0*  GFRNONAA 59* 56* 54*  GFRAA >60 >60 >60  ANIONGAP 11 7 8      Hematology Recent Labs  Lab 08/31/18 0517 09/01/18 0046 09/02/18 0541  WBC 11.4* 13.9* 16.6*  RBC 2.81* 2.92* 2.88*  HGB 9.3* 9.3* 9.4*  HCT 29.4* 29.9* 29.3*  MCV 104.6* 102.4* 101.7*  MCH 33.1 31.8 32.6  MCHC 31.6 31.1 32.1  RDW 15.6* 14.6 14.6  PLT 227 269 322    Cardiac Enzymes Recent Labs  Lab 08/29/18 0353 08/29/18 0708 08/29/18 1201 08/31/18 1027  TROPONINI 3.59* 6.39* 15.82* 12.47*   No results for input(s): TROPIPOC in the last 168 hours.  BNPNo results for input(s): BNP, PROBNP in the last 168 hours.   DDimer No results for input(s): DDIMER in the last 168 hours.   Radiology    Dg Chest Port 1 View  Result Date: 09/02/2018 CLINICAL DATA:  Acute respiratory distress. EXAM: PORTABLE CHEST 1 VIEW COMPARISON:  One-view chest x-ray 09/01/2018 FINDINGS: The heart is enlarged. Patient has been extubated. NG tube was removed. A right IJ line remains. Atherosclerotic changes are present at the aortic arch. Diffuse interstitial edema is stable. Bibasilar airspace disease is worse on the left. IMPRESSION: 1. Interval extubation and removal of NG tube. 2. Stable cardiomegaly and edema consistent with congestive heart failure. 3. Aortic atherosclerosis. Electronically Signed   By: San Morelle M.D.   On: 09/02/2018 07:29   Dg Chest Port 1 View  Result Date: 09/01/2018 CLINICAL DATA:  Respiratory failure. EXAM: PORTABLE CHEST 1  VIEW COMPARISON:  08/31/2018 FINDINGS: Endotracheal tube terminates 4.2 cm above the carina. Right jugular catheter terminates over the mid SVC. Enteric tube courses into the left upper abdomen. The cardiac silhouette remains enlarged. Patchy airspace opacities throughout both lungs have not significantly changed. IMPRESSION: Bilateral airspace disease without significant interval change. Electronically Signed   By: Logan Bores M.D.   On: 09/01/2018 06:54    Cardiac Studies   Study Conclusions  - Left ventricle: The cavity size was normal. Systolic function was   normal. The estimated ejection fraction was in the range of 60%   to 65%. Images were inadequate for LV wall motion assessment. The   study is not technically sufficient to allow evaluation of LV   diastolic function. - Aortic valve: Valve mobility was moderately restricted.   Suboptimal image quality limited Doppler assessment of aortic   valve systolic gradient. There was no regurgitation. - Mitral valve: Moderately calcified annulus. There was trivial   regurgitation. - Left atrium: The atrium was moderately dilated. - Tricuspid valve: There was trivial regurgitation.   Patient Profile     77 y.o. 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 We are involved for management of his atrial fib.   Assessment & Plan    Atrial fib:   Rate increased and now on IV amiodarone.  On IV heparin.  Not taking PO yet.  Will follow and transition to [place] and beta blocker when we are able.       Elevated Troponin/NSTEMI: EF was OK on echo.   Medical management. Continue IV Lasix.  Needs to mobilize quite a bit of fluid.  Positive 11 liters this admission.  Negative 5.4 over the last 24 hours.     For questions or updates, please contact [place] Please consult [email or web address] for contact info under Cardiology/STEMI.   Signed, Minus Breeding, MD  09/02/2018, 12:39 PM

## 2018-09-02 NOTE — Progress Notes (Signed)
ANTICOAGULATION CONSULT NOTE   Pharmacy Consult for heparin Indication: atrial fibrillation  Allergies  Allergen Reactions  . Ace Inhibitors Other (See Comments)    unknown  . Omeprazole Diarrhea  . Statins Other (See Comments)    Abnormal lab results(pt unsure of what labs)    Patient Measurements: Height: 5\' 9"  (175.3 cm) Weight: 286 lb 2.5 oz (129.8 kg) IBW/kg (Calculated) : 70.7 Heparin Dosing Weight: 105.9 kg  Vital Signs: Temp: 97.5 F (36.4 C) (12/31 1200) Temp Source: Axillary (12/31 1200) BP: 140/71 (12/31 1105) Pulse Rate: 40 (12/31 1105)  Labs: Recent Labs    08/31/18 0517 08/31/18 1027 09/01/18 0046 09/02/18 0320 09/02/18 0410 09/02/18 0541 09/02/18 1200  HGB 9.3*  --  9.3*  --   --  9.4*  --   HCT 29.4*  --  29.9*  --   --  29.3*  --   PLT 227  --  269  --   --  322  --   HEPARINUNFRC  --   --   --  SPECIMEN HEMOLYZED. HEMOLYSIS MAY AFFECT INTEGRITY OF RESULTS. <0.10*  --  0.32  CREATININE  --  1.23 1.27*  --   --   --   --   TROPONINI  --  12.47*  --   --   --   --   --     Estimated Creatinine Clearance: 65 mL/min (A) (by C-G formula based on SCr of 1.27 mg/dL (H)).  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 Found to have Afib RVR on 12/26.   Currently on IV heparin at 1900 units/hr. H/H low stable this AM. Plt wnl. Repeat HL this afternoon was therapeutic at 0.32. No more groin bleeding   Goal of Therapy:  Heparin level 0.3-0.5 units/ml Monitor platelets by anticoagulation protocol: Yes   Plan:  Continue heparin infusion at 1900 units/hr. Low goal/no bolus given Lt ICA Check confirmatory anti-Xa level in 6 hours and daily while on heparin Continue to monitor H&H and platelets  Vinnie Level, PharmD., BCPS Clinical Pharmacist Clinical phone for 09/02/18 until 3:30pm: [phone] If after 3:30pm, please refer to [facility] for unit-specific pharmacist

## 2018-09-02 NOTE — Progress Notes (Signed)
ANTICOAGULATION CONSULT NOTE   Pharmacy Consult for heparin Indication: atrial fibrillation  Allergies  Allergen Reactions  . Ace Inhibitors Other (See Comments)    unknown  . Omeprazole Diarrhea  . Statins Other (See Comments)    Abnormal lab results(pt unsure of what labs)    Patient Measurements: Height: 5\' 9"  (175.3 cm) Weight: 286 lb 2.5 oz (129.8 kg) IBW/kg (Calculated) : 70.7 Heparin Dosing Weight: 105.9 kg  Vital Signs: Temp: 99.1 F (37.3 C) (12/31 2000) Temp Source: Oral (12/31 2000) BP: 135/72 (12/31 1900) Pulse Rate: 91 (12/31 1900)  Labs: Recent Labs    08/31/18 0517 08/31/18 1027 09/01/18 0046  09/02/18 0410 09/02/18 0541 09/02/18 1200 09/02/18 1854  HGB 9.3*  --  9.3*  --   --  9.4*  --   --   HCT 29.4*  --  29.9*  --   --  29.3*  --   --   PLT 227  --  269  --   --  322  --   --   HEPARINUNFRC  --   --   --    < > <0.10*  --  0.32 <0.10*  CREATININE  --  1.23 1.27*  --   --   --   --   --   TROPONINI  --  12.47*  --   --   --   --   --   --    < > = values in this interval not displayed.    Estimated Creatinine Clearance: 65 mL/min (A) (by C-G formula based on SCr of 1.27 mg/dL (H)).  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 Found to have Afib RVR on 12/26.   Confirmatory heparin level dropped to undetectable. No issues noted.  Goal of Therapy:  Heparin level 0.3-0.5 units/ml Monitor platelets by anticoagulation protocol: Yes   Plan:  Increase heparin gtt to 2,100 units/hr Monitor daily heparin level, CBC, s/s of bleed  Enzo Bi, PharmD, BCPS, [facility] Clinical Pharmacist Phone number [phone] 09/02/2018 8:29 PM

## 2018-09-02 NOTE — Progress Notes (Signed)
Nutrition Follow-up  DOCUMENTATION CODES:   Morbid obesity  INTERVENTION:   Supplement diet once advanced  If short term nutrition needed recommend IR place Cortrak tube and begin:  Jevity 1.2 @ 65 ml/hr  30 ml Prostat daily  Provides: 1972 kcal, 101 grams protein, and 1265 ml free water.    NUTRITION DIAGNOSIS:   Inadequate oral intake related to inability to eat as evidenced by NPO status. Ongoing.   GOAL:   Patient will meet greater than or equal to 90% of their needs Not met.   MONITOR:   Diet advancement, I & O's  ASSESSMENT:   77 year old male who [facility] to [facility] ED on 12/25 with weakness, SOB, and chest pressure. CTA showing L ICA occlusion. Code Stroke. Pt did not receive TPA because he takes Pradaxa. PMH significant for HTN, T2DM, HLD, atrial fibrillation, chronic lymphedema, GERD. Pt transferred to [facility] and went to IR but unable to extract the clot.  Pt discussed during ICU rounds and with RN.  12/30 self-extubated 12/31 failed swallow eval, per RN plan to hold off on cortrak for now  Medications reviewed and include: dulcolax, vitamin D3, lasix, SSI, levemir Labs reviewed    Diet Order:   Diet Order            Diet NPO time specified  Diet effective now              EDUCATION NEEDS:   No education needs have been identified at this time  Skin:  Skin Assessment: Skin Integrity Issues: Skin Integrity Issues:: Other (Comment) Other: MASD to perineum, cellulitis to L and R legs  Last BM:  PTA/unknown  Height:   Ht Readings from Last 1 Encounters:  08/27/18 5' 9"  (1.753 m)    Weight:   Wt Readings from Last 1 Encounters:  09/02/18 129.8 kg    Ideal Body Weight:  72.7 kg  BMI:  Body mass index is 42.26 kg/m.  Estimated Nutritional Needs:   Kcal:  1900-2100  Protein:  100-115 grams  Fluid:  > 1.9 L/day  Maylon Peppers RD, LDN, CNSC [phone] Pager [phone] After Hours Pager

## 2018-09-02 NOTE — Progress Notes (Signed)
NAME:  Darren Frazier, MRN:  [id number], DOB:  1940-11-16, LOS: 6 ADMISSION DATE:  08/27/2018, CONSULTATION DATE: 08/27/2018 REFERRING MD:  Dr. Corliss Skainseveshwar, CHIEF COMPLAINT:  Rt leg weakness   Brief History   77 [facility] with Rt leg numbness and weakness.  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  He was [facility] to [facility].  He was intubated for neuro-IR intervention.  Past Medical History  A fib on pradaxa, HTN, Urethral stricture, cardiomyopathy, PAD, HLD, CVA, lymph edema, Vitamin D deficiency, Neuropathy, Hypothyroidism, DM  Significant Hospital Events   12/25 Admit, arteriogram by IR 12/26 A fib with RVR elevated troponin, progressive anemia, hypotension requiring pressors 12/27 Transfuse 1 unit PRBC, change to pressure control 12/28 vomiting, tube feeds held 12/29 improving ileus, feeds resumed. 12/30 self-extubation, rapid Afib - started on IV amiodarone.  Consults:  Neurology Neuro IR Cardiology  Procedures:  ETT 12/25 >>  Rt IJ CVL 12/27 >>  Significant Diagnostic Tests:  CT angio head/neck 12/25 >> occlusion of Lt ICA Echo 12/26 >> EF 60 to 65% CT abd/pelvis 12/27 >> small hematoma Rt inguinal region, b/l lower lung AD, ileus CXR 12/30 >> (personally reviewed) poor inspiratory effort, bilateral patchy infiltrates R>L Micro Data:  Sputum 12/26 >>  Scant normal respiratory flora, No organisms on Gram stain.  Antimicrobials:  Zosyn 12/27 >> 12/29  Interim history/subjective:  Self extubated yesterday.  Amiodarone started for rate control. Hypoxia this am, increased to NRB mask.  Objective   Blood pressure (!) 142/69, pulse (!) 109, temperature 99 F (37.2 C), temperature source Axillary, resp. rate (!) 29, height 5\' 9"  (1.753 m), weight 129.8 kg, SpO2 95 %.    Vent Mode: PCV FiO2 (%):  [40 %] 40 % Set Rate:  [16 bmp] 16 bmp PEEP:  [5 cmH20] 5 cmH20 Plateau Pressure:  [20 cmH20] 20 cmH20   Intake/Output Summary (Last 24 hours) at 09/02/2018  0721 Last data filed at 09/02/2018 0600 Gross per 24 hour  Intake 944.73 ml  Output 6342 ml  Net -5397.27 ml   Filed Weights   08/31/18 0500 09/01/18 0500 09/02/18 0500  Weight: (!) 143.5 kg (!) 145.7 kg 129.8 kg    Examination:  General - morbid obesity, generalized swelling.  Eyes - + scleral edema. ENT - Mucous membranes moist with no ulceration. Cardiac - rate controlled atrial fibrillation. HS normal. Chest - clear to auscultation anteriorly, unable to hear bases. Abdomen - soft, non tender, less distended, + bowel sounds GU - no lesions noted Extremities - 3+ non pitting edema Skin - line sites intact, + blistering Neuro - Following commands on intermittently. Awake, moving left hand spontaneously. 1/5 movement RUE, no movement RLE. R hemineglect.  Resolved Hospital Problem list   Respiratory failure  Assessment & Plan:   Ongoing post extubation respiratory insufficiency with hypoxia requiring increasing O2 support CXR and clinical exam consistent with acute pulmonary edema. 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  Persistent R hemiplegia. Encephalopathy due to multifactorial delirium due to sedatives and recent stroke. Atrial fibrillation with RVR Critically ill due to hypotension secondary requiring titration of vasopressors now resolving   PLAN  Continue to titrate O2 to keep saturation >90% At high risk for reintubation Diuresis Follow electrolytes. Limit sedative medications. Secondary stroke prevention. Amiodarone IV until enteral access established. IV heparin for stroke prevention. Can convert to DOAC once clear patient will not need trach or PEG. Stroke rehabilitation - PT, SLP  Best practice:  Diet: NPO -  Cortrak DVT prophylaxis: Systemic anticoagulation. GI prophylaxis: not indicated Mobility: Bed rest- progressive ambulation. Code Status: Full code Family Communication: updated wife at bedside on 12/30.   Labs    CMP Latest Ref Rng & Units  09/01/2018 08/31/2018 08/30/2018  Glucose 70 - 99 mg/dL 001(V) 494(W) 967(R)  BUN 8 - 23 mg/dL 91(M) 22 18  Creatinine 0.61 - 1.24 mg/dL 3.84(Y) 6.59 9.35  Sodium 135 - 145 mmol/L 143 142 137  Potassium 3.5 - 5.1 mmol/L 4.4 4.1 4.1  Chloride 98 - 111 mmol/L 114(H) 114(H) 107  CO2 22 - 32 mmol/L 21(L) 21(L) 19(L)  Calcium 8.9 - 10.3 mg/dL 8.0(L) 7.8(L) 7.6(L)  Total Protein 6.4 - 8.2 g/dL - - -  Total Bilirubin 0.2 - 1.0 mg/dL - - -  Alkaline Phos 50 - 136 Unit/L - - -  AST 15 - 37 Unit/L - - -  ALT 12 - 78 U/L - - -   CBC Latest Ref Rng & Units 09/02/2018 09/01/2018 08/31/2018  WBC 4.0 - 10.5 K/uL 16.6(H) 13.9(H) 11.4(H)  Hemoglobin 13.0 - 17.0 g/dL 7.0(V) 7.7(L) 3.9(Q)  Hematocrit 39.0 - 52.0 % 29.3(L) 29.9(L) 29.4(L)  Platelets 150 - 400 K/uL 322 269 227   ABG    Component Value Date/Time   PHART 7.375 08/31/2018 0454   PCO2ART 30.4 (L) 08/31/2018 0454   PO2ART 90.0 08/31/2018 0454   HCO3 17.8 (L) 08/31/2018 0454   TCO2 19 (L) 08/31/2018 0454   ACIDBASEDEF 6.0 (H) 08/31/2018 0454   O2SAT 97.0 08/31/2018 0454   CBG (last 3)  Recent Labs    09/01/18 0300 09/01/18 0827 09/01/18 1138  GLUCAP 114* 134* 163*      Lynnell Catalan, MD [facility] ICU Physician [facility]  Pager: [phone] Mobile: [phone] After hours: [phone].  09/02/2018, 7:21 AM

## 2018-09-02 NOTE — Progress Notes (Signed)
STROKE TEAM PROGRESS NOTE   SUBJECTIVE (INTERVAL HISTORY) His RN and wife are at the bedside.  Pt was so far tolerating extubation. Still has mild SOB but only needed nasal cannula. More awake alert and following commands. Able to tell me his name and age and in hospital. Moving left side but still has right hemiplegia. No fever today but WBC elevated. CXR showed CHF and he was getting lasix and anasarca improved today.    OBJECTIVE Temp:  [97.5 F (36.4 C)-99.7 F (37.6 C)] 97.5 F (36.4 C) (12/31 1200) Pulse Rate:  [40-131] 40 (12/31 1105) Cardiac Rhythm: Atrial fibrillation (12/31 0000) Resp:  [21-33] 26 (12/31 1105) BP: (113-171)/(61-154) 140/71 (12/31 1105) SpO2:  [94 %-100 %] 97 % (12/31 1105) Weight:  [129.8 kg] 129.8 kg (12/31 0500)  Recent Labs  Lab 09/01/18 1535 09/01/18 1958 09/02/18 0014 09/02/18 0351 09/02/18 0842  GLUCAP 147* 122* 120* 142* 142*   Recent Labs  Lab 08/28/18 0600 08/28/18 0921 08/28/18 1826 08/29/18 0416 08/29/18 1905 08/30/18 0655 08/31/18 1027 09/01/18 0046  NA 138  --   --  136  --  137 142 143  K 4.0  --   --  4.1  --  4.1 4.1 4.4  CL 104  --   --  103  --  107 114* 114*  CO2 25  --   --  21*  --  19* 21* 21*  GLUCOSE 140*  --   --  232*  --  191* 146* 162*  BUN 12  --   --  11  --  18 22 26*  CREATININE 0.98  --   --  1.23  --  1.18 1.23 1.27*  CALCIUM 7.6*  --   --  7.9*  --  7.6* 7.8* 8.0*  MG  --  1.5* 1.5* 1.6* 1.7  --   --   --   PHOS  --  3.4 3.3 3.2 2.2*  --   --   --    No results for input(s): AST, ALT, ALKPHOS, BILITOT, PROT, ALBUMIN in the last 168 hours. Recent Labs  Lab 08/28/18 0600  08/29/18 1734 08/30/18 0545 08/31/18 0517 09/01/18 0046 09/02/18 0541  WBC 16.1*   < > 22.2* 15.5* 11.4* 13.9* 16.6*  NEUTROABS 12.4*  --   --   --   --   --   --   HGB 9.2*   < > 10.4* 10.3* 9.3* 9.3* 9.4*  HCT 28.6*   < > 32.8* 31.9* 29.4* 29.9* 29.3*  MCV 99.0   < > 105.5* 101.3* 104.6* 102.4* 101.7*  PLT 229   < > 233 251 227  269 322   < > = values in this interval not displayed.   Recent Labs  Lab 08/29/18 0353 08/29/18 0708 08/29/18 1201 08/31/18 1027  TROPONINI 3.59* 6.39* 15.82* 12.47*   No results for input(s): LABPROT, INR in the last 72 hours. Recent Labs    09/02/18 1135  COLORURINE STRAW*  LABSPEC 1.010  PHURINE 5.0  GLUCOSEU NEGATIVE  HGBUR SMALL*  BILIRUBINUR NEGATIVE  KETONESUR NEGATIVE  PROTEINUR NEGATIVE  NITRITE NEGATIVE  LEUKOCYTESUR NEGATIVE       Component Value Date/Time   CHOL 138 08/28/2018 0600   TRIG 171 (H) 08/28/2018 0921   HDL 24 (L) 08/28/2018 0600   CHOLHDL 5.8 08/28/2018 0600   VLDL 33 08/28/2018 0600   LDLCALC 81 08/28/2018 0600   Lab Results  Component Value Date   HGBA1C 7.1 (  H) 08/28/2018   No results found for: LABOPIA, COCAINSCRNUR, LABBENZ, AMPHETMU, THCU, LABBARB  No results for input(s): ETH in the last 168 hours.  I have personally reviewed the radiological images below and agree with the radiology interpretations.  Ct Angio Head W Or Wo Contrast  Result Date: 08/27/2018 CLINICAL DATA:  Acute presentation with speech disturbance. EXAM: CT ANGIOGRAPHY HEAD AND NECK TECHNIQUE: Multidetector CT imaging of the head and neck was performed using the standard protocol during bolus administration of intravenous contrast. Multiplanar CT image reconstructions and MIPs were obtained to evaluate the vascular anatomy. Carotid stenosis measurements (when applicable) are obtained utilizing NASCET criteria, using the distal internal carotid diameter as the denominator. CONTRAST:  75mL ISOVUE-370 IOPAMIDOL (ISOVUE-370) INJECTION 76% COMPARISON:  CT same day. FINDINGS: CTA NECK FINDINGS Aortic arch: Aortic atherosclerosis.  No aneurysm or dissection. Right carotid system: Common carotid artery shows some atherosclerotic plaque but is widely patent to the bifurcation. There is soft and calcified plaque affecting the carotid bifurcation and ICA bulb region. There is severe  stenosis in the distal bulb region with luminal diameter of 1 mm. The vessel is quite tortuous beyond that but widely patent to the upper cervical region, where there is soft and calcified plaque resulting in minimal diameter of 3.5 mm. Compared to an expected diameter of 5 mm, stenosis in the bulb is 80% or greater. Stenosis in the upper cervical region is 30%. Left carotid system: Common carotid artery shows atherosclerotic plaque but is sufficiently patent to the bifurcation region. There is calcified and soft plaque at the carotid bifurcation and ICA bulb. There is left internal carotid artery occlusion at the distal bulb. 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 The left vertebral artery shows 50% stenosis at its origin but is sufficiently patent beyond that through the cervical region to the foramen magnum. Skeleton: Mid cervical spondylosis. Other neck: No mass or lymphadenopathy. Upper chest: Interstitial prominence which could go along with fluid overload or mild edema. Review of the MIP images confirms the above findings CTA HEAD FINDINGS Anterior circulation: Left internal carotid artery is occluded without antegrade flow through the skull base. Right internal carotid artery shows atherosclerotic disease in the carotid siphon region with stenosis estimated at 50-70%. The anterior and middle cerebral vessels are patent. There is atherosclerotic irregularity of the M1 segment with stenosis of 50-70%. No acute vessel occlusion is identified. There is a chronic punctate calcified embolus in 1 of the insular branches, but this was present in 2014. On the left, there is reconstituted flow in the distal siphon. Severe stenosis of the supraclinoid ICA, 80% or greater. Flow is present in the anterior and middle cerebral vessels, secondary to reconstituted flow and flow through communicating arteries. There is 50% stenosis in the M1 segment. I do not see any occluded or missing  large or medium vessels in the MCA territory. Posterior circulation: Right vertebral artery shows no antegrade flow at the foramen magnum. There is retrograde flow in the distal right vertebral. Left vertebral artery is patent at the foramen magnum. There is atherosclerotic disease of the V4 segment with stenosis estimated at 70%. There are serial stenoses. The vessel does show flow to the basilar, which shows atherosclerotic irregularity but is patent. Flow is present in the superior cerebellar and posterior cerebral arteries. Venous sinuses: Patent and normal. Anatomic variants: None significant. Delayed phase: No abnormal enhancement. 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 Severe irregular atherosclerotic disease of the right carotid bifurcation with stenosis of 80% in the bulb region and 30% in the distal ICA. 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 50% stenosis of the left vertebral artery origin. Severe atherosclerotic disease in both carotid siphon regions. Right siphon stenosis estimated at 50-70%. Reconstituted flow in the distal left siphon and supraclinoid region. 80% supraclinoid stenosis. Flow in both anterior and middle cerebral artery territories. Stenosis of both M1 segments estimated at 50-70%. I do not identify any large or medium vessel occlusion acutely within the MCA branch vessels. Severely disease left V4 segment with serial stenoses proximal to the basilar estimated at 70%. Basilar atherosclerotic irregularity without flow limiting stenosis. Posterior circulation branch vessels do show flow, including right PICA which is supplied by retrograde flow in the distal right vertebral artery. Electronically Signed   By: Paulina Fusi M.D.   On: 08/27/2018 13:14   Dg Chest 2 View  Result Date: 08/27/2018 CLINICAL DATA:  Chest pain and shortness of breath.  Cardiomyopathy. EXAM: CHEST - 2 VIEW  COMPARISON:  03/07/2013 FINDINGS: Mild cardiomegaly. Aortic atherosclerosis. Diffuse interstitial infiltrates, consistent with pulmonary edema. Mild subsegmental atelectasis also seen in the left upper lobe. No evidence of pulmonary consolidation or significant pleural effusion. IMPRESSION: Mild cardiomegaly and diffuse interstitial edema, consistent with congestive heart failure. Electronically Signed   By: Myles Rosenthal M.D.   On: 08/27/2018 12:02   Ct Angio Neck W Or Wo Contrast  Result Date: 08/27/2018 CLINICAL DATA:  Acute presentation with speech disturbance. EXAM: CT ANGIOGRAPHY HEAD AND NECK TECHNIQUE: Multidetector CT imaging of the head and neck was performed using the standard protocol during bolus administration of intravenous contrast. Multiplanar CT image reconstructions and MIPs were obtained to evaluate the vascular anatomy. Carotid stenosis measurements (when applicable) are obtained utilizing NASCET criteria, using the distal internal carotid diameter as the denominator. CONTRAST:  75mL ISOVUE-370 IOPAMIDOL (ISOVUE-370) INJECTION 76% COMPARISON:  CT same day. FINDINGS: CTA NECK FINDINGS Aortic arch: Aortic atherosclerosis.  No aneurysm or dissection. Right carotid system: Common carotid artery shows some atherosclerotic plaque but is widely patent to the bifurcation. There is soft and calcified plaque affecting the carotid bifurcation and ICA bulb region. There is severe stenosis in the distal bulb region with luminal diameter of 1 mm. The vessel is quite tortuous beyond that but widely patent to the upper cervical region, where there is soft and calcified plaque resulting in minimal diameter of 3.5 mm. Compared to an expected diameter of 5 mm, stenosis in the bulb is 80% or greater. Stenosis in the upper cervical region is 30%. Left carotid system: Common carotid artery shows atherosclerotic plaque but is sufficiently patent to the bifurcation region. There is calcified and soft plaque at the  carotid bifurcation and ICA bulb. There is left internal carotid artery occlusion at the distal bulb. 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 The left vertebral artery shows 50% stenosis at its origin but is sufficiently patent beyond that through the cervical region to the foramen magnum. Skeleton: Mid cervical spondylosis. Other neck: No mass or lymphadenopathy. Upper chest: Interstitial prominence which could go along with fluid overload or mild edema. Review of the MIP images confirms the above findings CTA HEAD FINDINGS Anterior circulation: Left internal carotid artery is occluded without antegrade flow through the skull base. Right internal carotid artery shows atherosclerotic disease in the carotid siphon region with stenosis estimated at 50-70%. The  anterior and middle cerebral vessels are patent. There is atherosclerotic irregularity of the M1 segment with stenosis of 50-70%. No acute vessel occlusion is identified. There is a chronic punctate calcified embolus in 1 of the insular branches, but this was present in 2014. On the left, there is reconstituted flow in the distal siphon. Severe stenosis of the supraclinoid ICA, 80% or greater. Flow is present in the anterior and middle cerebral vessels, secondary to reconstituted flow and flow through communicating arteries. There is 50% stenosis in the M1 segment. I do not see any occluded or missing large or medium vessels in the MCA territory. Posterior circulation: Right vertebral artery shows no antegrade flow at the foramen magnum. There is retrograde flow in the distal right vertebral. Left vertebral artery is patent at the foramen magnum. There is atherosclerotic disease of the V4 segment with stenosis estimated at 70%. There are serial stenoses. The vessel does show flow to the basilar, which shows atherosclerotic irregularity but is patent. Flow is present in the superior cerebellar and posterior cerebral arteries.  Venous sinuses: Patent and normal. Anatomic variants: None significant. Delayed phase: No abnormal enhancement. 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 Severe irregular atherosclerotic disease of the right carotid bifurcation with stenosis of 80% in the bulb region and 30% in the distal ICA. 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 50% stenosis of the left vertebral artery origin. Severe atherosclerotic disease in both carotid siphon regions. Right siphon stenosis estimated at 50-70%. Reconstituted flow in the distal left siphon and supraclinoid region. 80% supraclinoid stenosis. Flow in both anterior and middle cerebral artery territories. Stenosis of both M1 segments estimated at 50-70%. I do not identify any large or medium vessel occlusion acutely within the MCA branch vessels. Severely disease left V4 segment with serial stenoses proximal to the basilar estimated at 70%. Basilar atherosclerotic irregularity without flow limiting stenosis. Posterior circulation branch vessels do show flow, including right PICA which is supplied by retrograde flow in the distal right vertebral artery. Electronically Signed   By: Paulina Fusi M.D.   On: 08/27/2018 13:14   Mr Brain Wo Contrast  Result Date: 08/28/2018 CLINICAL DATA:  Follow-up examination for acute stroke, right lower extremity weakness. Known left ICA occlusion. EXAM: MRI HEAD WITHOUT CONTRAST TECHNIQUE: Multiplanar, multiecho pulse sequences of the brain and surrounding structures were obtained without intravenous contrast. COMPARISON:  Prior CTs from 08/27/2018. FINDINGS: Brain: Generalized age-related cerebral atrophy. Patchy and confluent T2/FLAIR hyperintensity within the periventricular and deep white matter both cerebral hemispheres most consistent with chronic microvascular ischemic disease, moderate nature. Scatter remote cortical infarcts involving the  posterior left frontoparietal region, right parietal lobe, and right frontal lobe are seen. Small remote right cerebellar infarct. Remote lacunar infarct present at the left caudate head. Scattered chronic hemosiderin staining seen about several of these infarcts. Scattered multifocal foci of restricted diffusion involving the cortical subcortical left frontal, parietal, and temporal occipital regions, compatible with acute ischemic left MCA territory infarcts. Largest area of infarction seen at the high left frontal parietal region and measures 2.5 cm (series 3, image 48). Possible faint petechial hemorrhage about a few of these infarcts without frank hemorrhagic transformation or significant mass effect. Findings likely embolic in nature. No mass lesion, midline shift or mass effect. Mild diffuse ventricular prominence related global parenchymal volume loss without hydrocephalus. No extra-axial fluid collection. Pituitary gland within normal limits. Vascular: Abnormal flow void within  the left ICA to its cavernous segment, compatible with previously identified left ICA occlusion. Major intracranial vascular flow voids otherwise maintained at the skull base. Skull and upper cervical spine: Craniocervical junction within normal limits. Upper cervical spine normal. Bone marrow signal intensity within normal limits. No scalp soft tissue abnormality. Sinuses/Orbits: Patient status post bilateral ocular lens replacement. Globes and orbital soft tissues demonstrate no acute finding. Scattered mucosal thickening seen throughout the paranasal sinuses with superimposed scattered air-fluid levels. Fluid seen within the nasopharynx. Patient likely intubated. Trace bilateral mastoid effusions noted. Inner ear structures normal. Other: None. IMPRESSION: 1. 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 Possible faint scattered associated petechial hemorrhage without frank  hemorrhagic transformation or significant mass effect. 2. Abnormal flow void within the left ICA to its cavernous segment, consistent with known left ICA occlusion. 3. 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 4. Moderately advanced cerebral atrophy with chronic small vessel ischemic disease. Electronically Signed   By: Rise Mu M.D.   On: 08/28/2018 15:33   Ct Abdomen Pelvis W Contrast  Result Date: 08/29/2018 CLINICAL DATA:  Abdominal distention.  Bloating. EXAM: CT ABDOMEN AND PELVIS WITH CONTRAST TECHNIQUE: Multidetector CT imaging of the abdomen and pelvis was performed using the standard protocol following bolus administration of intravenous contrast. CONTRAST:  OMNIPAQUE IOHEXOL 300 MG/ML  SOLN COMPARISON:  Abdominal radiograph dated 08/28/2018 and abdominal ultrasound dated 02/28/2016 FINDINGS: Lower chest: There are bilateral consolidative infiltrates in the lower lobes as well as minimal bilateral pleural effusions. Aortic atherosclerosis. Coronary artery calcifications. Hepatobiliary: Liver parenchyma is normal. No biliary ductal dilatation. Vicarious excretion of contrast in the normal appearing gallbladder. Pancreas: Unremarkable. No pancreatic ductal dilatation or surrounding inflammatory changes. Spleen: Normal in size without focal abnormality. Adrenals/Urinary Tract: Adrenal glands are normal. Normal right kidney. Large chronic cyst in the lower pole of the left kidney with some calcification in the wall. Maximum diameter of the cyst is approximately 9 cm. Stomach/Bowel: NG tube tip in the body of the stomach. There are few diverticula in the proximal sigmoid portion of the colon. There is moderate air in the colon without significant distention. Small bowel appears normal. Fluid and air in the stomach. Appendix has been removed. Vascular/Lymphatic: There is a poorly defined hematoma in the right  inguinal region in the panniculus. This measures approximately 12 x 5 x 2 cm and is probably related to recent angiography. There are several small lymph nodes in the right inguinal region, the largest being 15 mm in diameter. Extensive aortic atherosclerosis. No adenopathy. Reproductive: Dense calcification in the normal-sized prostate gland. Other: There is a small amount of ascites in the abdomen, nonspecific. Small periumbilical hernia containing only fat. Edema in the subcutaneous fat of both flanks, right greater than left. Slight edema in the panniculus and in the lateral aspect of both thighs. Musculoskeletal: No acute abnormalities. Multilevel degenerative disc disease in the thoracic spine and lumbar spine. IMPRESSION: 1. Small hematoma in the right inguinal region in the panniculus, probably secondary to the recent angiography. 2. Bilateral consolidative infiltrates in the posterior aspect of both lower lobes with tiny adjacent pleural effusions. 3. Small amount of nonspecific ascites. 4. Slightly prominent gas in the bowel without evidence of bowel obstruction. This probably represents an ileus. Electronically Signed   By: Francene Boyers M.D.   On: 08/29/2018 17:03   Ir Ct Head Ltd  Result Date: 08/31/2018 CLINICAL DATA:  New onset right-sided weakness with mild word-finding difficulties. 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 EXAM: IR ANGIO INTRA EXTRACRAN SEL COM CAROTID INNOMINATE UNI RIGHT MOD SED; IR PERCUTANEOUS ART THORMBECTOMY/INFUSION INTRACRANIAL INCLUDE DIAG ANGIO; IR ANGIO VERTEBRAL SEL SUBCLAVIAN INNOMINATE UNI LEFT MOD SED; IR CT HEAD LIMITED COMPARISON:  CT angiogram of the head and neck of 08/27/2018 MEDICATIONS: Heparin 3,000 units IV; . Ancef 2 g IV antibiotic was administered within 1 hour of the procedure  ANESTHESIA/SEDATION: General anesthesia CONTRAST:  Isovue 300 approximately 100 mL FLUOROSCOPY TIME:  Fluoroscopy Time: 60 minutes 0 seconds (4327 mGy). COMPLICATIONS: None immediate. TECHNIQUE: Informed written consent was obtained from the patient after a thorough discussion of the procedural risks, benefits and alternatives. All questions were addressed. Maximal Sterile Barrier Technique was utilized including caps, mask, sterile gowns, sterile gloves, sterile drape, hand hygiene and skin antiseptic. A timeout was performed prior to the initiation of the procedure. The right groin was prepped and draped in the usual sterile fashion. Thereafter using modified Seldinger technique, transfemoral access into the right common femoral artery was obtained without difficulty. Over a 0.035 inch guidewire, a 5 French Pinnacle sheath was inserted. Through this, and also over 0.035 inch guidewire, a 5 [place] JB 1 catheter was advanced to the aortic arch region and selectively positioned in the right common carotid artery, the innominate artery , the left common carotid artery and the left vertebral artery. FINDINGS: The left common carotid artery tear g demonstrates a moderate tortuosity of the proximal left common carotid artery The left external carotid artery proximally has a mild to moderate stenosis. Is branches are otherwise normally opacified The left internal carotid artery at the bulb would demonstrates the complete angiographic occlusion with no evidence of a string sign on the delayed arterial images. The innominate artery arteriogram demonstrates patency of the right subclavian artery and the right common carotid artery proximally There is no angiographic evidence of the right vertebral artery The right common carotid bifurcation demonstrates the right external carotid artery and its major branches to be widely patent The right internal carotid artery just distal to the bulb has a severe 90% plus stenosis. Distal  to this there is a U-shaped configuration of the right internal carotid artery. More distally the vessel is normal caliber The petrous the cavernous segments and supraclinoid segments are widely patent There is mild atherosclerotic irregularity involving the proximal cavernous, and distal cavernous portions of the right internal carotid artery. Right posterior communicating artery is seen opacifying the right posterior cerebral artery and the distal basilar artery. The right middle cerebral artery has approximately 30 40% stenosis of the right M1 segment The trifurcation branches appear to be widely patent into the capillary and venous phases The right anterior cerebral artery A1 segment and distally demonstrate wide patency is into the capillary and venous phases There is simultaneous cross-filling via the anterior communicating artery of the left anterior cerebral artery A2 segment and distally. Also demonstrated is opacification of the left anterior cerebral A1 segment, with flow noted into the left middle cerebral artery distribution. Unopacified blood is seen in the left middle cerebral artery from the collateral circulation. The left subclavian arteriogram demonstrates mild stenosis at the origin of the right subclavian artery Mild atherosclerotic disease is noted of the subclavian artery and in the region of the  thyrocervical trunk The dominant left vertebral artery origin is widely patent The vessel demonstrates mild tortuosity just distal to this. More distally the vessel is seen to opacify to the cranial skull base There is severe focal of focal stenosis of the left vertebrobasilar junction just distal to the origin of the left posterior-inferior cerebellar artery Additionally there is a approximately 70% stenosis of the proximal basilar artery More distally the basilar artery demonstrates mild caliber irregularity. There is a opacification of the left posterior cerebral artery the superior cerebellar  arteries left greater than right, and the anterior-inferior cerebellar arteries Retrograde opacification of the right vertebrobasilar junction to the level of the right posterior-inferior cerebellar arteries is noted. Caliber irregularity of the right posterior-inferior cerebellar artery suggests intracranial arteriosclerosis. Left internal carotid artery occlusion or revascularization attempt The diagnostic JB 1 catheter left common carotid artery was then exchanged over a 0.035 inch 300 cm a 7 French Pinnacle sheath in the right groin. A diagnostic JB 1 catheter were then advanced over the rise exchange guidewire to the descending thoracic aorta. The 7 French Pinnacle sheath was then exchanged over a 0.035 inch 100 cm Amplatz stiff guidewire for a a 8 French 55 cm Brite tip neurovascular sheath using biplane roadmap technique and constant fluoroscopic guidance. Good good aspiration was obtained from the side port of the neurovascular sheath. This was then connected to continuous heparinized saline infusion. The embolus guidewire was then removed. Through the 8 French neurovascular sheath, over a 0.035 inch roadrunner guidewire, a 5 5 [place] JB 1 catheter was advanced to the recover region and select position in the left common carotid artery. Using biplane technique and constant fluoroscopic guidance, over the of the 5 inch roadrunner guidewire, the 5 [place] JB 1 catheter was advanced to the left external carotid artery and exchanged over a 0.035 inch 300 cm rise exchange guidewire for an 8 French 85 cm flow gap balloon guide catheter which had been prepped with 50% contrast and 50% heparinized saline infusion The guidewire was removed. Good aspiration obtained from the hub of the 8 [place] Flo guide catheter. Gentle contrast injection demonstrated no evidence of spasms dissections or of intraluminal filling defects. At this time, in a coaxial manner and with constant heparinized saline infusion, a tree approached  1 microcatheter was advanced over a point 0.014 inch soft tip synchro micro guidewire to the distal end of the Flo guide catheter The Flood guide catheter was advanced to just inside the left internal carotid artery bulb. Thereafter, multiple attempts were made to advance the microcatheter over the micro guidewire first with Ace synchro soft and then a regular synchro guidewire. In 016 headliner double angled micro guidewire was also utilized in order to access the occluded left internal carotid artery. It was noted that there was a severe tortuosity at the junction of the proximal and the middle 1/3 of the left internal carotid artery Following multiple attempts it was decided to stop. A control was then performed through the [facility] guide catheter in the left common carotid artery which revealed brisk cross-filling of the left internal carotid artery distal cavernous and supraclinoid segments from the left external carotid artery branches via the ipsilateral ophthalmic artery Flow is noted into the left middle cerebral artery as well Also noted was a retrograde opacification from the left posterior communicating artery into the supraclinoid left ICA and then middle cerebral artery An earlier diagnostic catheter arteriogram of the right common carotid artery he had revealed partial cross-filling via  the anterior communicating artery from the right internal carotid artery Given the the collateral circulation, it was decided to stop the procedure. Throughout the procedure, the patient's blood pressure and neurological status remained stable No evidence of hemodynamic instability was noted No gross mass-effect or move filling defects or extravasation was seen Prior to the attempted revascularization of the left internal carotid artery occlusion, the patient was given 180 mg of Brilinta, and 81 mg of aspirin orally the an orogastric tube. Also the patient was loaded with 3000 units IV heparin in order to facilitate  revascularization. The the 8 [place] flu guide catheter was then retrieved and removed. The 8 [place] Brite tip neurovascular sheath was then exchanged over a J-tip guidewire for a in 8 French 35 cm Brite tip neurovascular sheath. This was then connected to continuous heparinized saline infusion An arteriogram performed through this 3 5 cm 8 French Brite tip neurovascular sheath in the abdominal aorta reveal excellent flow through the common iliacs, the external and internal iliacs, and also the visualized portions of the common femoral arteries below the level of the inguinal ligaments. At the end of procedure, a CT of the brain revealed no evidence of gross intracranial hemorrhage,, or mass effect or midline shift. The patient was left intubated on account of his wound at the time of his intubation The right groin appeared soft without evidence of a hematoma. Distal pulses in the dorsalis pedis, and posterior regions remained dopplerable bilaterally. The patient was then transferred to the neuro ICU for further post stroke management. IMPRESSION: . 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 Approximately 90% stenosis of the dominant left vertebrobasilar junction distal to the origin of the left posterior-inferior cerebellar artery, and of approximately 70% of the proximal basilar artery. 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 PLAN: Follow-up in the clinic 1 month post discharge Electronically Signed   By: Julieanne Cotton M.D.   On: 08/28/2018 16:08   Ct Cerebral Perfusion W Contrast  Result Date: 08/27/2018 CLINICAL DATA:  Right-sided weakness onset this morning. EXAM: CT PERFUSION BRAIN TECHNIQUE: Multiphase CT imaging of the brain was performed following IV bolus contrast injection. Subsequent parametric perfusion maps were calculated  using RAPID software. CONTRAST:  40mL ISOVUE-370 IOPAMIDOL (ISOVUE-370) INJECTION 76% COMPARISON:  CT a head today FINDINGS: CT Brain Perfusion Findings: CBF (<30%) Volume: 0mL Perfusion (Tmax>6.0s) volume: Mismatch Volume: ASPECTS on noncontrast CT Head today, not calculated due to extensive chronic ischemic change. No definite acute infarct on CT. Infarct Core: 0 mL Infarction Location:Delayed perfusion throughout the left MCA territory with apparent sparing of the basal ganglia. There is occlusion of the left internal carotid artery on CTA, age indeterminate. Left middle cerebral artery is diseased and may have delayed perfusion due to collateral circulation and stenosis. IMPRESSION: 155 mL of delayed perfusion in the left MCA territory. No core infarction. Delayed perfusion left MCA territory may be due to severe intracranial atherosclerotic disease as well as occlusion of the left internal carotid artery. Age of the internal carotid artery occlusion is indeterminate. It is possible this delayed perfusion is a chronic or possibly acute finding. Electronically Signed   By: Marlan Palau M.D.   On: 08/27/2018 16:12   Dg Chest Port 1 View  Result Date: 09/02/2018 CLINICAL DATA:  Acute respiratory distress. EXAM: PORTABLE CHEST 1 VIEW COMPARISON:  One-view chest x-ray 09/01/2018 FINDINGS:  The heart is enlarged. Patient has been extubated. NG tube was removed. A right IJ line remains. Atherosclerotic changes are present at the aortic arch. Diffuse interstitial edema is stable. Bibasilar airspace disease is worse on the left. IMPRESSION: 1. Interval extubation and removal of NG tube. 2. Stable cardiomegaly and edema consistent with congestive heart failure. 3. Aortic atherosclerosis. Electronically Signed   By: Marin Roberts M.D.   On: 09/02/2018 07:29   Dg Chest Port 1 View  Result Date: 09/01/2018 CLINICAL DATA:  Respiratory failure. EXAM: PORTABLE CHEST 1 VIEW COMPARISON:  08/31/2018  FINDINGS: Endotracheal tube terminates 4.2 cm above the carina. Right jugular catheter terminates over the mid SVC. Enteric tube courses into the left upper abdomen. The cardiac silhouette remains enlarged. Patchy airspace opacities throughout both lungs have not significantly changed. IMPRESSION: Bilateral airspace disease without significant interval change. Electronically Signed   By: Sebastian Ache M.D.   On: 09/01/2018 06:54   Dg Chest Port 1 View  Result Date: 08/31/2018 CLINICAL DATA:  CXR for Respiratory failure. Hx of HTN, stroke, AAA, Diabetes, and Cardiomyopathy. EXAM: PORTABLE CHEST 1 VIEW COMPARISON:  08/30/2018 FINDINGS: Endotracheal tube is in place, tip 3.8 centimeters above the carina. Nasogastric tube is in place, tip beyond the gastroesophageal junction and off the image. A RIGHT IJ central line tip overlies the superior vena cava. Patient is rotated towards the LEFT. The heart is enlarged. There is persistent airspace filling opacity throughout the RIGHT lung, slightly improved. There is significant opacity in the LEFT lung base, consistent with atelectasis or consolidation and increased since 08/27/2018. IMPRESSION: 1. Slight improvement in aeration of the RIGHT lung. 2. Increased opacity in the LEFT lung base. Electronically Signed   By: Norva Pavlov M.D.   On: 08/31/2018 09:52   Dg Chest Port 1 View  Result Date: 08/30/2018 CLINICAL DATA:  Respiratory failure. Cardiomyopathy. EXAM: PORTABLE CHEST 1 VIEW COMPARISON:  08/29/2018 and 08/27/2018 FINDINGS: Endotracheal tube and NG tube and central line appear unchanged and in good position. There has been progression of the bilateral diffuse pulmonary infiltrates, particularly in the right upper lung zone of the left lung base. Heart size and pulmonary vascularity are normal. No discrete effusions. Aortic atherosclerosis. No acute bone abnormality. IMPRESSION: Progressive bilateral pulmonary infiltrates. Aortic Atherosclerosis  (ICD10-I70.0). Electronically Signed   By: Francene Boyers M.D.   On: 08/30/2018 08:48   Dg Chest Port 1 View  Result Date: 08/29/2018 CLINICAL DATA:  Central line placement EXAM: PORTABLE CHEST 1 VIEW COMPARISON:  08/29/2018 FINDINGS: Right central line is been placed with the tip in the SVC. No pneumothorax. Endotracheal tube and NG tube are unchanged. Cardiomegaly with vascular congestion and mild pulmonary edema, stable. IMPRESSION: Right central line tip in the SVC. No pneumothorax. Continued mild CHF. Electronically Signed   By: Charlett Nose M.D.   On: 08/29/2018 11:58   Dg Chest Port 1 View  Result Date: 08/29/2018 CLINICAL DATA:  PT presented to [facility] ED on 08/27/18 with complaints of right lower extremity weakness, dyspnea and chest pressure. Hx of stroke, Peripheral vascualar disease, cardiomyopathy, and A-fib. EXAM: PORTABLE CHEST 1 VIEW COMPARISON:  08/27/2018 FINDINGS: Mild enlargement of the cardiac silhouette, stable. No mediastinal or hilar masses. There is central vascular congestion. Mild hazy opacity noted in the perihilar regions and medial lung bases bilaterally. No convincing pleural effusion and no pneumothorax. Endotracheal tube tip projects 3.8 cm above the Carina. Nasogastric tube passes below the diaphragm well into the stomach. IMPRESSION: 1. Findings are  consistent with mild congestive heart failure with evidence of slight improvement when compared to 08/27/2018. No new abnormalities. 2. Support apparatus is well positioned. Electronically Signed   By: Amie Portland M.D.   On: 08/29/2018 07:26   Dg Chest Port 1 View  Result Date: 08/28/2018 CLINICAL DATA:  Shortness of Breath EXAM: PORTABLE CHEST 1 VIEW COMPARISON:  08/27/2018 FINDINGS: Cardiac shadow is enlarged in size. Aortic calcifications are noted. Endotracheal tube is noted at the level of the carina. This should be withdrawn 2-3 cm. Nasogastric catheter is noted within the stomach. Lungs are well aerated bilaterally  with mild interstitial edema similar to that seen on the prior exam. IMPRESSION: Interval intubation with the endotracheal tube at the level of the carina. This should be withdrawn 2-3 cm. Stable edema bilaterally. These results will be called to the ordering clinician or representative by the Radiologist Assistant, and communication documented in the PACS or zVision Dashboard. Electronically Signed   By: Alcide Clever M.D.   On: 08/28/2018 00:19   Dg Abd Portable 1v  Result Date: 08/28/2018 CLINICAL DATA:  Orogastric tube placement. EXAM: PORTABLE ABDOMEN - 1 VIEW COMPARISON:  None. FINDINGS: The patient's enteric tube is seen ending overlying the body of the stomach, with the side port about the fundus of the stomach. The visualized bowel gas pattern is grossly unremarkable. A small left pleural effusion is noted. Increased interstitial markings raise concern for pulmonary edema. Vascular congestion is noted. The patient's endotracheal tube is seen ending 2-3 cm above the carina. No acute osseous abnormalities are identified. IMPRESSION: 1. Enteric tube seen ending overlying the body of the stomach, with the side port about the fundus of the stomach. 2. Small left pleural effusion noted. Increased interstitial markings raise concern for pulmonary edema. Electronically Signed   By: Roanna Raider M.D.   On: 08/28/2018 22:08   Ir Percutaneous Art Thrombectomy/infusion Intracranial Inc Diag Angio  Result Date: 08/31/2018 CLINICAL DATA:  New onset right-sided weakness with mild word-finding difficulties. 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 EXAM: IR ANGIO INTRA EXTRACRAN SEL COM CAROTID INNOMINATE UNI RIGHT MOD SED; IR PERCUTANEOUS ART THORMBECTOMY/INFUSION INTRACRANIAL INCLUDE DIAG ANGIO; IR ANGIO VERTEBRAL SEL SUBCLAVIAN INNOMINATE UNI LEFT  MOD SED; IR CT HEAD LIMITED COMPARISON:  CT angiogram of the head and neck of 08/27/2018 MEDICATIONS: Heparin 3,000 units IV; . Ancef 2 g IV antibiotic was administered within 1 hour of the procedure ANESTHESIA/SEDATION: General anesthesia CONTRAST:  Isovue 300 approximately 100 mL FLUOROSCOPY TIME:  Fluoroscopy Time: 60 minutes 0 seconds (4327 mGy). COMPLICATIONS: None immediate. TECHNIQUE: Informed written consent was obtained from the patient after a thorough discussion of the procedural risks, benefits and alternatives. All questions were addressed. Maximal Sterile Barrier Technique was utilized including caps, mask, sterile gowns, sterile gloves, sterile drape, hand hygiene and skin antiseptic. A timeout was performed prior to the initiation of the procedure. The right groin was prepped and draped in the usual sterile fashion. Thereafter using modified Seldinger technique, transfemoral access into the right common femoral artery was obtained without difficulty. Over a 0.035 inch guidewire, a 5 French Pinnacle sheath was inserted. Through this, and also over 0.035 inch guidewire, a 5 [place] JB 1 catheter was advanced to the aortic arch region and selectively positioned in the right common carotid artery, the innominate artery , the  left common carotid artery and the left vertebral artery. FINDINGS: The left common carotid artery tear g demonstrates a moderate tortuosity of the proximal left common carotid artery The left external carotid artery proximally has a mild to moderate stenosis. Is branches are otherwise normally opacified The left internal carotid artery at the bulb would demonstrates the complete angiographic occlusion with no evidence of a string sign on the delayed arterial images. The innominate artery arteriogram demonstrates patency of the right subclavian artery and the right common carotid artery proximally There is no angiographic evidence of the right vertebral artery The right common carotid  bifurcation demonstrates the right external carotid artery and its major branches to be widely patent The right internal carotid artery just distal to the bulb has a severe 90% plus stenosis. Distal to this there is a U-shaped configuration of the right internal carotid artery. More distally the vessel is normal caliber The petrous the cavernous segments and supraclinoid segments are widely patent There is mild atherosclerotic irregularity involving the proximal cavernous, and distal cavernous portions of the right internal carotid artery. Right posterior communicating artery is seen opacifying the right posterior cerebral artery and the distal basilar artery. The right middle cerebral artery has approximately 30 40% stenosis of the right M1 segment The trifurcation branches appear to be widely patent into the capillary and venous phases The right anterior cerebral artery A1 segment and distally demonstrate wide patency is into the capillary and venous phases There is simultaneous cross-filling via the anterior communicating artery of the left anterior cerebral artery A2 segment and distally. Also demonstrated is opacification of the left anterior cerebral A1 segment, with flow noted into the left middle cerebral artery distribution. Unopacified blood is seen in the left middle cerebral artery from the collateral circulation. 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 More distally the vessel is seen to opacify to the cranial skull base There is severe focal of focal stenosis of the left vertebrobasilar junction just distal to the origin of the left posterior-inferior cerebellar artery Additionally there is a approximately 70%  stenosis of the proximal basilar artery More distally the basilar artery demonstrates mild caliber irregularity. There is a opacification of the left posterior cerebral artery the superior cerebellar arteries left greater than right, and the anterior-inferior cerebellar arteries Retrograde opacification of the right vertebrobasilar junction to the level of the right posterior-inferior cerebellar arteries is noted. Caliber irregularity of the right posterior-inferior cerebellar artery suggests intracranial arteriosclerosis. Left internal carotid artery occlusion or revascularization attempt The diagnostic JB 1 catheter left common carotid artery was then exchanged over a 0.035 inch 300 cm a 7 French Pinnacle sheath in the right groin. A diagnostic JB 1 catheter were then advanced over the rise exchange guidewire to the descending thoracic aorta. The 7 French Pinnacle sheath was then exchanged over a 0.035 inch 100 cm Amplatz stiff guidewire for a a 8 French 55 cm Brite tip neurovascular sheath using biplane roadmap technique and constant fluoroscopic guidance. Good good aspiration was obtained from the side port of the neurovascular sheath. This was then connected to continuous heparinized saline infusion. The embolus guidewire was then removed. Through the 8 French neurovascular sheath, over a 0.035 inch roadrunner guidewire, a 5 5 [place] JB  1 catheter was advanced to the recover region and select position in the left common carotid artery. Using biplane technique and constant fluoroscopic guidance, over the of the 5 inch roadrunner guidewire, the 5 [place] JB 1 catheter was advanced to the left external carotid artery and exchanged over a 0.035 inch 300 cm rise exchange guidewire for an 8 French 85 cm flow gap balloon guide catheter which had been prepped with 50% contrast and 50% heparinized saline infusion The guidewire was removed. Good aspiration obtained from the hub of the 8 [place] Flo guide catheter. Gentle  contrast injection demonstrated no evidence of spasms dissections or of intraluminal filling defects. At this time, in a coaxial manner and with constant heparinized saline infusion, a tree approached 1 microcatheter was advanced over a point 0.014 inch soft tip synchro micro guidewire to the distal end of the Flo guide catheter The Flood guide catheter was advanced to just inside the left internal carotid artery bulb. Thereafter, multiple attempts were made to advance the microcatheter over the micro guidewire first with Ace synchro soft and then a regular synchro guidewire. In 016 headliner double angled micro guidewire was also utilized in order to access the occluded left internal carotid artery. It was noted that there was a severe tortuosity at the junction of the proximal and the middle 1/3 of the left internal carotid artery Following multiple attempts it was decided to stop. A control was then performed through the [facility] guide catheter in the left common carotid artery which revealed brisk cross-filling of the left internal carotid artery distal cavernous and supraclinoid segments from the left external carotid artery branches via the ipsilateral ophthalmic artery Flow is noted into the left middle cerebral artery as well Also noted was a retrograde opacification from the left posterior communicating artery into the supraclinoid left ICA and then middle cerebral artery An earlier diagnostic catheter arteriogram of the right common carotid artery he had revealed partial cross-filling via the anterior communicating artery from the right internal carotid artery Given the the collateral circulation, it was decided to stop the procedure. Throughout the procedure, the patient's blood pressure and neurological status remained stable No evidence of hemodynamic instability was noted No gross mass-effect or move filling defects or extravasation was seen Prior to the attempted revascularization of the left internal  carotid artery occlusion, the patient was given 180 mg of Brilinta, and 81 mg of aspirin orally the an orogastric tube. Also the patient was loaded with 3000 units IV heparin in order to facilitate revascularization. The the 8 [place] flu guide catheter was then retrieved and removed. The 8 [place] Brite tip neurovascular sheath was then exchanged over a J-tip guidewire for a in 8 French 35 cm Brite tip neurovascular sheath. This was then connected to continuous heparinized saline infusion An arteriogram performed through this 3 5 cm 8 French Brite tip neurovascular sheath in the abdominal aorta reveal excellent flow through the common iliacs, the external and internal iliacs, and also the visualized portions of the common femoral arteries below the level of the inguinal ligaments. At the end of procedure, a CT of the brain revealed no evidence of gross intracranial hemorrhage,, or mass effect or midline shift. The patient was left intubated on account of his wound at the time of his intubation The right groin appeared soft without evidence of a hematoma. Distal pulses in the dorsalis pedis, and posterior regions remained dopplerable bilaterally. The patient was then transferred to the neuro ICU for further post  stroke management. IMPRESSION: . 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 Approximately 90% stenosis of the dominant left vertebrobasilar junction distal to the origin of the left posterior-inferior cerebellar artery, and of approximately 70% of the proximal basilar artery. 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 PLAN: Follow-up in the clinic 1 month post discharge Electronically Signed   By: Julieanne Cotton M.D.   On: 08/28/2018 16:08   Ct Head Code Stroke Wo Contrast`  Result Date: 08/27/2018 CLINICAL DATA:  Code stroke. Slurred  speech beginning 3 weeks ago. Right-sided weakness beginning today. EXAM: CT HEAD WITHOUT CONTRAST TECHNIQUE: Contiguous axial images were obtained from the base of the skull through the vertex without intravenous contrast. COMPARISON:  CT 03/07/2013 FINDINGS: Brain: The brainstem and cerebellum appear normal by CT. Cerebral hemispheres show age related volume loss. There are old cortical and subcortical infarctions in the right frontal lobe, left parietal vertex, left frontal lobe, and left frontoparietal vertex. The show atrophy and encephalomalacia with gliosis. There is no finding of acute or subacute infarction on this CT. There is old lacunar infarction in the left caudate. Vascular: There is atherosclerotic calcification of the major vessels at the base of the brain. No evidence of acute hyperdense vessel. Skull: Negative Sinuses/Orbits: Clear/normal Other: None ASPECTS ([place] Stroke Program Early CT Score) Aspects is difficult in this patient with old infarctions. I do not suspect an acute insult. IMPRESSION: 1. No acute finding by CT. 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 2. 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 No acute finding is suspected by CT. 3. These results were called by telephone at the time of interpretation on 08/27/2018 at 11:47 am to Dr. Daryel November , who verbally acknowledged these results. Electronically Signed   By: Paulina Fusi M.D.   On: 08/27/2018 11:49   Ir Angio Intra Extracran Sel Com Carotid Innominate Uni R Mod Sed  Result Date: 08/31/2018 CLINICAL DATA:  New onset right-sided weakness with mild word-finding difficulties. 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 EXAM: IR ANGIO INTRA  EXTRACRAN SEL COM CAROTID INNOMINATE UNI RIGHT MOD SED; IR PERCUTANEOUS ART THORMBECTOMY/INFUSION INTRACRANIAL INCLUDE DIAG ANGIO; IR ANGIO VERTEBRAL SEL SUBCLAVIAN INNOMINATE UNI LEFT MOD SED; IR CT HEAD LIMITED COMPARISON:  CT angiogram of the head and neck of 08/27/2018 MEDICATIONS: Heparin 3,000 units IV; . Ancef 2 g IV antibiotic was administered within 1 hour of the procedure ANESTHESIA/SEDATION: General anesthesia CONTRAST:  Isovue 300 approximately 100 mL FLUOROSCOPY TIME:  Fluoroscopy Time: 60 minutes 0 seconds (4327 mGy). COMPLICATIONS: None immediate. TECHNIQUE: Informed written consent was obtained from the patient after a thorough discussion of the procedural risks, benefits and alternatives. All questions were addressed. Maximal Sterile Barrier Technique was utilized including caps, mask, sterile gowns, sterile gloves, sterile drape, hand hygiene and skin antiseptic. A timeout was performed prior to the initiation of the procedure. The right groin was prepped and draped in the usual sterile fashion. Thereafter using modified Seldinger technique, transfemoral access into the right common femoral artery was obtained without difficulty. Over a 0.035 inch guidewire, a 5 French Pinnacle sheath was inserted. Through this, and also over 0.035 inch guidewire,  a 5 [place] JB 1 catheter was advanced to the aortic arch region and selectively positioned in the right common carotid artery, the innominate artery , the left common carotid artery and the left vertebral artery. FINDINGS: The left common carotid artery tear g demonstrates a moderate tortuosity of the proximal left common carotid artery The left external carotid artery proximally has a mild to moderate stenosis. Is branches are otherwise normally opacified The left internal carotid artery at the bulb would demonstrates the complete angiographic occlusion with no evidence of a string sign on the delayed arterial images. The innominate artery arteriogram  demonstrates patency of the right subclavian artery and the right common carotid artery proximally There is no angiographic evidence of the right vertebral artery The right common carotid bifurcation demonstrates the right external carotid artery and its major branches to be widely patent The right internal carotid artery just distal to the bulb has a severe 90% plus stenosis. Distal to this there is a U-shaped configuration of the right internal carotid artery. More distally the vessel is normal caliber The petrous the cavernous segments and supraclinoid segments are widely patent There is mild atherosclerotic irregularity involving the proximal cavernous, and distal cavernous portions of the right internal carotid artery. Right posterior communicating artery is seen opacifying the right posterior cerebral artery and the distal basilar artery. The right middle cerebral artery has approximately 30 40% stenosis of the right M1 segment The trifurcation branches appear to be widely patent into the capillary and venous phases The right anterior cerebral artery A1 segment and distally demonstrate wide patency is into the capillary and venous phases There is simultaneous cross-filling via the anterior communicating artery of the left anterior cerebral artery A2 segment and distally. Also demonstrated is opacification of the left anterior cerebral A1 segment, with flow noted into the left middle cerebral artery distribution. Unopacified blood is seen in the left middle cerebral artery from the collateral circulation. 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 More distally the vessel is seen to opacify to the cranial skull base There is  severe focal of focal stenosis of the left vertebrobasilar junction just distal to the origin of the left posterior-inferior cerebellar artery Additionally there is a approximately 70% stenosis of the proximal basilar artery More distally the basilar artery demonstrates mild caliber irregularity. There is a opacification of the left posterior cerebral artery the superior cerebellar arteries left greater than right, and the anterior-inferior cerebellar arteries Retrograde opacification of the right vertebrobasilar junction to the level of the right posterior-inferior cerebellar arteries is noted. Caliber irregularity of the right posterior-inferior cerebellar artery suggests intracranial arteriosclerosis. Left internal carotid artery occlusion or revascularization attempt The diagnostic JB 1 catheter left common carotid artery was then exchanged over a 0.035 inch 300 cm a 7 French Pinnacle sheath in the right groin. A diagnostic JB 1 catheter were then advanced over the rise exchange guidewire to the descending thoracic aorta. The 7 French Pinnacle sheath was then exchanged over a 0.035 inch 100 cm Amplatz stiff guidewire for a a 8 French 55 cm Brite tip neurovascular sheath using biplane roadmap technique and constant fluoroscopic guidance. Good good aspiration was obtained from the side port of the neurovascular sheath. This was then connected to  continuous heparinized saline infusion. The embolus guidewire was then removed. Through the 8 French neurovascular sheath, over a 0.035 inch roadrunner guidewire, a 5 5 [place] JB 1 catheter was advanced to the recover region and select position in the left common carotid artery. Using biplane technique and constant fluoroscopic guidance, over the of the 5 inch roadrunner guidewire, the 5 [place] JB 1 catheter was advanced to the left external carotid artery and exchanged over a 0.035 inch 300 cm rise exchange guidewire for an 8 French 85 cm flow gap balloon guide catheter  which had been prepped with 50% contrast and 50% heparinized saline infusion The guidewire was removed. Good aspiration obtained from the hub of the 8 [place] Flo guide catheter. Gentle contrast injection demonstrated no evidence of spasms dissections or of intraluminal filling defects. At this time, in a coaxial manner and with constant heparinized saline infusion, a tree approached 1 microcatheter was advanced over a point 0.014 inch soft tip synchro micro guidewire to the distal end of the Flo guide catheter The Flood guide catheter was advanced to just inside the left internal carotid artery bulb. Thereafter, multiple attempts were made to advance the microcatheter over the micro guidewire first with Ace synchro soft and then a regular synchro guidewire. In 016 headliner double angled micro guidewire was also utilized in order to access the occluded left internal carotid artery. It was noted that there was a severe tortuosity at the junction of the proximal and the middle 1/3 of the left internal carotid artery Following multiple attempts it was decided to stop. A control was then performed through the [facility] guide catheter in the left common carotid artery which revealed brisk cross-filling of the left internal carotid artery distal cavernous and supraclinoid segments from the left external carotid artery branches via the ipsilateral ophthalmic artery Flow is noted into the left middle cerebral artery as well Also noted was a retrograde opacification from the left posterior communicating artery into the supraclinoid left ICA and then middle cerebral artery An earlier diagnostic catheter arteriogram of the right common carotid artery he had revealed partial cross-filling via the anterior communicating artery from the right internal carotid artery Given the the collateral circulation, it was decided to stop the procedure. Throughout the procedure, the patient's blood pressure and neurological status remained stable  No evidence of hemodynamic instability was noted No gross mass-effect or move filling defects or extravasation was seen Prior to the attempted revascularization of the left internal carotid artery occlusion, the patient was given 180 mg of Brilinta, and 81 mg of aspirin orally the an orogastric tube. Also the patient was loaded with 3000 units IV heparin in order to facilitate revascularization. The the 8 [place] flu guide catheter was then retrieved and removed. The 8 [place] Brite tip neurovascular sheath was then exchanged over a J-tip guidewire for a in 8 French 35 cm Brite tip neurovascular sheath. This was then connected to continuous heparinized saline infusion An arteriogram performed through this 3 5 cm 8 French Brite tip neurovascular sheath in the abdominal aorta reveal excellent flow through the common iliacs, the external and internal iliacs, and also the visualized portions of the common femoral arteries below the level of the inguinal ligaments. At the end of procedure, a CT of the brain revealed no evidence of gross intracranial hemorrhage,, or mass effect or midline shift. The patient was left intubated on account of his wound at the time of his intubation The right groin appeared soft without evidence  of a hematoma. Distal pulses in the dorsalis pedis, and posterior regions remained dopplerable bilaterally. The patient was then transferred to the neuro ICU for further post stroke management. IMPRESSION: . 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 Approximately 90% stenosis of the dominant left vertebrobasilar junction distal to the origin of the left posterior-inferior cerebellar artery, and of approximately 70% of the proximal basilar artery. 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 PLAN: Follow-up in the clinic 1 month post  discharge Electronically Signed   By: Julieanne CottonSanjeev  Deveshwar M.D.   On: 08/28/2018 16:08   Ir Angio Vertebral Sel Subclavian Innominate Uni L Mod Sed  Result Date: 08/31/2018 CLINICAL DATA:  New onset right-sided weakness with mild word-finding difficulties. 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 EXAM: IR ANGIO INTRA EXTRACRAN SEL COM CAROTID INNOMINATE UNI RIGHT MOD SED; IR PERCUTANEOUS ART THORMBECTOMY/INFUSION INTRACRANIAL INCLUDE DIAG ANGIO; IR ANGIO VERTEBRAL SEL SUBCLAVIAN INNOMINATE UNI LEFT MOD SED; IR CT HEAD LIMITED COMPARISON:  CT angiogram of the head and neck of 08/27/2018 MEDICATIONS: Heparin 3,000 units IV; . Ancef 2 g IV antibiotic was administered within 1 hour of the procedure ANESTHESIA/SEDATION: General anesthesia CONTRAST:  Isovue 300 approximately 100 mL FLUOROSCOPY TIME:  Fluoroscopy Time: 60 minutes 0 seconds (4327 mGy). COMPLICATIONS: None immediate. TECHNIQUE: Informed written consent was obtained from the patient after a thorough discussion of the procedural risks, benefits and alternatives. All questions were addressed. Maximal Sterile Barrier Technique was utilized including caps, mask, sterile gowns, sterile gloves, sterile drape, hand hygiene and skin antiseptic. A timeout was performed prior to the initiation of the procedure. The right groin was prepped and draped in the usual sterile fashion. Thereafter using modified Seldinger technique, transfemoral access into the right common femoral artery was obtained without difficulty. Over a 0.035 inch guidewire, a 5 French Pinnacle sheath was inserted. Through this, and also over 0.035 inch guidewire, a 5 [place] JB 1 catheter was advanced to the aortic arch region and selectively positioned in the right common carotid artery, the innominate artery , the left common carotid  artery and the left vertebral artery. FINDINGS: The left common carotid artery tear g demonstrates a moderate tortuosity of the proximal left common carotid artery The left external carotid artery proximally has a mild to moderate stenosis. Is branches are otherwise normally opacified The left internal carotid artery at the bulb would demonstrates the complete angiographic occlusion with no evidence of a string sign on the delayed arterial images. The innominate artery arteriogram demonstrates patency of the right subclavian artery and the right common carotid artery proximally There is no angiographic evidence of the right vertebral artery The right common carotid bifurcation demonstrates the right external carotid artery and its major branches to be widely patent The right internal carotid artery just distal to the bulb has a severe 90% plus stenosis. Distal to this there is a U-shaped configuration of the right internal carotid artery. More distally the vessel is normal caliber The petrous the cavernous segments and supraclinoid segments are widely patent There is mild atherosclerotic irregularity involving the proximal cavernous, and distal cavernous portions of the right internal carotid artery. Right posterior communicating artery is seen opacifying the right posterior cerebral artery and the distal  basilar artery. The right middle cerebral artery has approximately 30 40% stenosis of the right M1 segment The trifurcation branches appear to be widely patent into the capillary and venous phases The right anterior cerebral artery A1 segment and distally demonstrate wide patency is into the capillary and venous phases There is simultaneous cross-filling via the anterior communicating artery of the left anterior cerebral artery A2 segment and distally. Also demonstrated is opacification of the left anterior cerebral A1 segment, with flow noted into the left middle cerebral artery distribution. Unopacified blood is  seen in the left middle cerebral artery from the collateral circulation. 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 More distally the vessel is seen to opacify to the cranial skull base There is severe focal of focal stenosis of the left vertebrobasilar junction just distal to the origin of the left posterior-inferior cerebellar artery Additionally there is a approximately 70% stenosis of the proximal basilar artery More distally the basilar artery demonstrates mild caliber irregularity. There is a opacification of the left posterior cerebral artery the superior cerebellar arteries left greater than right, and the anterior-inferior cerebellar arteries Retrograde opacification of the right vertebrobasilar junction to the level of the right posterior-inferior cerebellar arteries is noted. Caliber irregularity of the right posterior-inferior cerebellar artery suggests intracranial arteriosclerosis. Left internal carotid artery occlusion or revascularization attempt The diagnostic JB 1 catheter left common carotid artery was then exchanged over a 0.035 inch 300 cm a 7 French Pinnacle sheath in the right groin. A diagnostic JB 1 catheter were then advanced over the rise exchange guidewire to the descending thoracic aorta. The 7 French Pinnacle sheath was then exchanged over a 0.035 inch 100 cm Amplatz stiff guidewire for a a 8 French 55 cm Brite tip neurovascular sheath using biplane roadmap technique and constant fluoroscopic guidance. Good good aspiration was obtained from the side port of the neurovascular sheath. This was then connected to continuous heparinized saline infusion. The embolus guidewire was then removed. Through the 8  French neurovascular sheath, over a 0.035 inch roadrunner guidewire, a 5 5 [place] JB 1 catheter was advanced to the recover region and select position in the left common carotid artery. Using biplane technique and constant fluoroscopic guidance, over the of the 5 inch roadrunner guidewire, the 5 [place] JB 1 catheter was advanced to the left external carotid artery and exchanged over a 0.035 inch 300 cm rise exchange guidewire for an 8 French 85 cm flow gap balloon guide catheter which had been prepped with 50% contrast and 50% heparinized saline infusion The guidewire was removed. Good aspiration obtained from the hub of the 8 [place] Flo guide catheter. Gentle contrast injection demonstrated no evidence of spasms dissections or of intraluminal filling defects. At this time, in a coaxial manner and with constant heparinized saline infusion, a tree approached 1 microcatheter was advanced over a point 0.014 inch soft tip synchro micro guidewire to the distal end of the Flo guide catheter The Flood guide catheter was advanced to just inside the left internal carotid artery bulb. Thereafter, multiple attempts were made to advance the microcatheter over the micro guidewire first with Ace synchro soft and then a regular synchro guidewire. In 016 headliner double angled micro guidewire was also utilized in order  to access the occluded left internal carotid artery. It was noted that there was a severe tortuosity at the junction of the proximal and the middle 1/3 of the left internal carotid artery Following multiple attempts it was decided to stop. A control was then performed through the [facility] guide catheter in the left common carotid artery which revealed brisk cross-filling of the left internal carotid artery distal cavernous and supraclinoid segments from the left external carotid artery branches via the ipsilateral ophthalmic artery Flow is noted into the left middle cerebral artery as well Also noted was a retrograde  opacification from the left posterior communicating artery into the supraclinoid left ICA and then middle cerebral artery An earlier diagnostic catheter arteriogram of the right common carotid artery he had revealed partial cross-filling via the anterior communicating artery from the right internal carotid artery Given the the collateral circulation, it was decided to stop the procedure. Throughout the procedure, the patient's blood pressure and neurological status remained stable No evidence of hemodynamic instability was noted No gross mass-effect or move filling defects or extravasation was seen Prior to the attempted revascularization of the left internal carotid artery occlusion, the patient was given 180 mg of Brilinta, and 81 mg of aspirin orally the an orogastric tube. Also the patient was loaded with 3000 units IV heparin in order to facilitate revascularization. The the 8 [place] flu guide catheter was then retrieved and removed. The 8 [place] Brite tip neurovascular sheath was then exchanged over a J-tip guidewire for a in 8 French 35 cm Brite tip neurovascular sheath. This was then connected to continuous heparinized saline infusion An arteriogram performed through this 3 5 cm 8 French Brite tip neurovascular sheath in the abdominal aorta reveal excellent flow through the common iliacs, the external and internal iliacs, and also the visualized portions of the common femoral arteries below the level of the inguinal ligaments. At the end of procedure, a CT of the brain revealed no evidence of gross intracranial hemorrhage,, or mass effect or midline shift. The patient was left intubated on account of his wound at the time of his intubation The right groin appeared soft without evidence of a hematoma. Distal pulses in the dorsalis pedis, and posterior regions remained dopplerable bilaterally. The patient was then transferred to the neuro ICU for further post stroke management. IMPRESSION: . Status post  attempted endovascular revascularization of occluded left carotid artery at the bulb. 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 Approximately 90% stenosis of the dominant left vertebrobasilar junction distal to the origin of the left posterior-inferior cerebellar artery, and of approximately 70% of the proximal basilar artery. 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 PLAN: Follow-up in the clinic 1 month post discharge Electronically Signed   By: Julieanne Cotton M.D.   On: 08/28/2018 16:08    PHYSICAL EXAM  Temp:  [97.5 F (36.4 C)-99.7 F (37.6 C)] 97.5 F (36.4 C) (12/31 1200) Pulse Rate:  [40-131] 40 (12/31 1105) Resp:  [21-33] 26 (12/31 1105) BP: (113-171)/(61-154) 140/71 (12/31 1105) SpO2:  [94 %-100 %] 97 % (12/31 1105) Weight:  [129.8 kg] 129.8 kg (12/31 0500)  General - Well nourished, well developed, in mild respiratory distress.  Right UE swelling  Ophthalmologic - fundi not visualized due to noncooperation.  Cardiovascular - irregularly irregular heart rate and rhythm.  Neuro - awake alert, in mild respiratory distress, able to following most simple commands.  Left gaze preference, not able to cross midline,  able to track objects on the left visual field, inconsistent blinking to visual threat on the left, but not on the right.  PERRL.  Facial symmetric.  Tongue protrusion not corporative.  Left upper extremity 3/5 proximal and 4/5 on bicep and finger movement.  Right lower extremity and right upper extremity no movement on pain stimulation.  Left lower extremity proximal and distal 3/5.  DTR diminished, Babinski negative.  Sensation, coordination and gait not tested.   ASSESSMENT/PLAN Mr.Nirvan Hershey Knauer a 77 y.o.malewith history of HTN, atrial fibrillation on Pradaxa last dose last evening, urethral stricture, cardiomyopathy, peripheral vascular disease, hypertension, hyperlipidemia, stroke  with some balance issues, bilateral lymphedema vitamin D deficiencypresenting with right lower extremity numbness and weaknessand suspected new Lt ICA occlusion.Hedid not receive IV t-PA due to late presentation.  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  MRI head -left MCA and ACA territory scattered small infarcts  CTA H&N - left ICA, right VA occlusion.  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  2D Echo - EF 60 to 65%  LDL- 81  HgbA1c- 7.1  VTE prophylaxis -SCDs  aspirin 81 mg daily and Pradaxa (dabigatran) twice a dayprior to admission, now on heparin IV   Ongoing aggressive stroke risk factor management  Therapy recommendations: pending  Disposition: Pending  Aspiration pneumonitis  Fever Tmax 101.2->101.3-> afebrile->101.3  Leukocytosis WBC 22.2-15.5-11.4-13.9->16.6  CXR CHF  On lasix 60mg  bid  Sputum culture normal flora  Off Zosyn today  UA negative  CCM on board  Respiratory failure  Was intubated on ventilation  Self extubated on 09/01/18  Off sedation  So far on nasal cannula, tolerating  CCM on board  Atrial fibrillation, chronic  On Pradaxa PTA, compliant with medication  Now on heparin IV  On amiodarone IV  Rate controlled  Consider switch to po once po access  Hypotension History of hypertension  BP stable within the goal  Off neo  Avoid hypotension due to 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 as well as basal artery stenosis  BP goal 120-160  CHF  CXR showed CHF  Fluid  overload - on lasix 60 Q12  Anasarca much improved  Hyperlipidemia  Lipid lowering medication BJY:NWGN  LDL81, goal < 70  Current lipid lowering medication:none (statin intolerant - abnormal labs)  Diabetes  HgbA1c7.1, goal < 7.0  Uncontrolled  Hyperglycemia improved  SSI  CBG monitoring  On Levemir  Dysphagia   Did not pass swallow  NPO so far  Continue gentle hydration  Will do cortrak in am if still not able to pass swallow  Other Stroke Risk Factors  Advanced age  Former cigarette smoker - quit  Obesity,Body mass index is 47.79 kg/m., recommend weight loss, diet and exercise as appropriate   Hx stroke/TIA  PVD  Other Active Problems  Urethral stricture -on Foley  Diffuse anasarca - much improved  Hospital day # 6  This patient is critically ill due to left MCA and ACA infarct, bilateral ICA occlusions/stenosis, A. fib, respiratory failure, aspiration pneumonia, fever and at significant risk of neurological worsening,  death form recurrent stroke, hemorrhagic conversion, seizure, heart failure, septic shock, sepsis. This patient's care requires constant monitoring of vital signs, hemodynamics, respiratory and cardiac monitoring, review of multiple databases, neurological assessment, discussion with family, other specialists and medical decision making of high complexity. I spent 40 minutes of neurocritical care time in the care of this patient. I had long discussion with wife and pt at bedside, updated pt current condition, treatment plan and potential prognosis. They expressed understanding and appreciation.      Marvel Plan, MD PhD Stroke Neurology 09/02/2018 2:10 PM     To contact Stroke Continuity provider, please refer to [email or web address]. After hours, contact General Neurology

## 2018-09-03 ENCOUNTER — Inpatient Hospital Stay (HOSPITAL_COMMUNITY): Payer: Medicare HMO

## 2018-09-03 DIAGNOSIS — I5033 Acute on chronic diastolic (congestive) heart failure: Secondary | ICD-10-CM

## 2018-09-03 DIAGNOSIS — I4819 Other persistent atrial fibrillation: Secondary | ICD-10-CM

## 2018-09-03 LAB — BASIC METABOLIC PANEL
Anion gap: 15 (ref 5–15)
Anion gap: 13 (ref 5–15)
BUN: 19 mg/dL (ref 8–23)
BUN: 18 mg/dL (ref 8–23)
CHLORIDE: 105 mmol/L (ref 98–111)
CO2: 26 mmol/L (ref 22–32)
CO2: 26 mmol/L (ref 22–32)
Calcium: 8.6 mg/dL — ABNORMAL LOW (ref 8.9–10.3)
Calcium: 8.1 mg/dL — ABNORMAL LOW (ref 8.9–10.3)
Chloride: 105 mmol/L (ref 98–111)
Creatinine, Ser: 1.23 mg/dL (ref 0.61–1.24)
Creatinine, Ser: 1.13 mg/dL (ref 0.61–1.24)
GFR calc Af Amer: >60 mL/min (ref >60)
GFR calc Af Amer: >60 mL/min (ref >60)
GFR calc non Af Amer: 56 mL/min — ABNORMAL LOW (ref >60)
GFR calc non Af Amer: >60 mL/min (ref >60)
Glucose, Bld: 87 mg/dL (ref 70–99)
Glucose, Bld: 155 mg/dL — ABNORMAL HIGH (ref 70–99)
Potassium: 2.8 mmol/L — ABNORMAL LOW (ref 3.5–5.1)
Potassium: 2.8 mmol/L — ABNORMAL LOW (ref 3.5–5.1)
Sodium: 146 mmol/L — ABNORMAL HIGH (ref 135–145)
Sodium: 144 mmol/L (ref 135–145)

## 2018-09-03 LAB — CBC
HCT: 31.3 % — ABNORMAL LOW (ref 39.0–52.0)
Hemoglobin: 9.9 g/dL — ABNORMAL LOW (ref 13.0–17.0)
MCH: 32.4 pg (ref 26.0–34.0)
MCHC: 31.6 g/dL (ref 30.0–36.0)
MCV: 102.3 fL — ABNORMAL HIGH (ref 80.0–100.0)
Platelets: 363 10*3/uL (ref 150–400)
RBC: 3.06 MIL/uL — ABNORMAL LOW (ref 4.22–5.81)
RDW: 14.6 % (ref 11.5–15.5)
WBC: 19.2 10*3/uL — ABNORMAL HIGH (ref 4.0–10.5)
nRBC: 1.3 % — ABNORMAL HIGH (ref 0.0–0.2)

## 2018-09-03 LAB — GLUCOSE, CAPILLARY
Glucose-Capillary: 105 mg/dL — ABNORMAL HIGH (ref 70–99)
Glucose-Capillary: 130 mg/dL — ABNORMAL HIGH (ref 70–99)
Glucose-Capillary: 136 mg/dL — ABNORMAL HIGH (ref 70–99)
Glucose-Capillary: 126 mg/dL — ABNORMAL HIGH (ref 70–99)

## 2018-09-03 LAB — MAGNESIUM: Magnesium: 1.8 mg/dL (ref 1.7–2.4)

## 2018-09-03 LAB — HEPARIN LEVEL (UNFRACTIONATED): HEPARIN UNFRACTIONATED: 0.58 [IU]/mL (ref 0.30–0.70)

## 2018-09-03 MED ORDER — MUPIROCIN 2 % EX OINT
1.0000 "application " | TOPICAL_OINTMENT | Freq: Two times a day (BID) | CUTANEOUS | Status: AC
  Administered 2018-09-03 – 2018-09-08 (×9): 1 via NASAL
  Filled 2018-09-03: qty 22

## 2018-09-03 MED ORDER — AMIODARONE HCL 200 MG PO TABS
200.0000 mg | ORAL_TABLET | Freq: Every day | ORAL | Status: DC
  Administered 2018-09-04 – 2018-09-16 (×13): 200 mg via ORAL
  Filled 2018-09-03 (×13): qty 1

## 2018-09-03 MED ORDER — LEVOTHYROXINE SODIUM 88 MCG PO TABS
88.0000 ug | ORAL_TABLET | Freq: Every day | ORAL | Status: DC
  Administered 2018-09-04 – 2018-09-17 (×14): 88 ug via ORAL
  Filled 2018-09-03 (×14): qty 1

## 2018-09-03 MED ORDER — POTASSIUM CHLORIDE CRYS ER 20 MEQ PO TBCR
40.0000 meq | EXTENDED_RELEASE_TABLET | Freq: Once | ORAL | Status: AC
  Administered 2018-09-04: 40 meq via ORAL
  Filled 2018-09-03: qty 2

## 2018-09-03 MED ORDER — APIXABAN 5 MG PO TABS
5.0000 mg | ORAL_TABLET | Freq: Two times a day (BID) | ORAL | Status: DC
  Administered 2018-09-03 – 2018-09-16 (×27): 5 mg via ORAL
  Filled 2018-09-03 (×28): qty 1

## 2018-09-03 MED ORDER — MAGNESIUM SULFATE 2 GM/50ML IV SOLN
2.0000 g | Freq: Once | INTRAVENOUS | Status: AC
  Administered 2018-09-03: 2 g via INTRAVENOUS
  Filled 2018-09-03: qty 50

## 2018-09-03 MED ORDER — AMIODARONE HCL 200 MG PO TABS: 200.0000 mg | ORAL_TABLET | Freq: Every day | ORAL | Status: DC

## 2018-09-03 MED ORDER — METOPROLOL TARTRATE 25 MG PO TABS
25.0000 mg | ORAL_TABLET | Freq: Two times a day (BID) | ORAL | Status: DC
  Administered 2018-09-03 – 2018-09-04 (×3): 25 mg via ORAL
  Filled 2018-09-03 (×3): qty 1

## 2018-09-03 MED ORDER — SODIUM CHLORIDE 0.9 % IV SOLN
INTRAVENOUS | Status: DC | PRN
  Administered 2018-09-03: 500 mL via INTRAVENOUS

## 2018-09-03 MED ORDER — POTASSIUM CHLORIDE CRYS ER 20 MEQ PO TBCR
40.0000 meq | EXTENDED_RELEASE_TABLET | Freq: Four times a day (QID) | ORAL | Status: AC
  Administered 2018-09-03 (×2): 40 meq via ORAL
  Filled 2018-09-03 (×2): qty 2

## 2018-09-03 MED ORDER — CHLORHEXIDINE GLUCONATE CLOTH 2 % EX PADS
6.0000 | MEDICATED_PAD | Freq: Every day | CUTANEOUS | Status: AC
  Administered 2018-09-04 – 2018-09-07 (×3): 6 via TOPICAL

## 2018-09-03 NOTE — Progress Notes (Signed)
Progress Note  Patient Name: Darren Frazier Date of Encounter: 09/03/2018  Primary Cardiologist:   No primary care provider on file.   Subjective   Lethargic no cardiac complaints Still NPO   Inpatient Medications    Scheduled Meds: . chlorhexidine gluconate (MEDLINE KIT)  15 mL Mouth Rinse BID  . cholecalciferol  1,000 Units Per Tube Daily  . furosemide  60 mg Intravenous Q12H  . insulin aspart  0-20 Units Subcutaneous Q4H  . insulin detemir  10 Units Subcutaneous Daily  . levothyroxine  44 mcg Intravenous Daily  . mouth rinse  15 mL Mouth Rinse BID   Continuous Infusions: . sodium chloride Stopped (09/02/18 1152)  . amiodarone 30 mg/hr (09/03/18 0634)  . heparin 2,100 Units/hr (09/02/18 2042)  . phenylephrine 70m/250mL (0.164mmL) infusion     PRN Meds: acetaminophen **OR** acetaminophen (TYLENOL) oral liquid 160 mg/5 mL **OR** acetaminophen, metoprolol tartrate, ondansetron (ZOFRAN) IV, polyvinyl alcohol, senna-docusate   Vital Signs      Vitals:   09/03/18 0200 09/03/18 0300 09/03/18 0400 09/03/18 0500  BP: 129/73 123/68 123/66   Pulse: 96 (!) 104 (!) 101   Resp: 20 (!) 22 (!) 26   Temp:   99.5 F (37.5 C)   TempSrc:   Axillary   SpO2: 98% 96% 99%   Weight:    (!) 136.9 kg  Height:       Physical Exam   Lethargic Chronically ill obese white male  HEENT: left ptosis  Neck supple with no adenopathy JVP normal no bruits no thyromegaly Lungs clear with no wheezing and good diaphragmatic motion Heart:  S1/S2 no murmur, no rub, gallop or click PMI normal Abdomen: benighn, BS positve, no tenderness, no AAA no bruit.  No HSM or HJR  Foley to gravity  Distal pulses intact with no bruits Plus 2 bilateral edema Neuro non-focal Skin warm and dry No muscular weakness    Intake/Output Summary (Last 24 hours) at 09/03/2018 0819 Last data filed at 09/02/2018 2300 Gross per 24 hour  Intake 1865.42 ml  Output 5350 ml  Net -3484.58 ml   Filed  Weights   09/01/18 0500 09/02/18 0500 09/03/18 0500  Weight: (!) 145.7 kg 129.8 kg (!) 136.9 kg    Telemetry    Atrial fib.  Rate currently controlled 90-100 bpm . .   - Personally Reviewed  ECG    AFib RBBB old IMI lateral T wave changes   Labs    Chemistry Recent Labs  Lab 08/31/18 1027 09/01/18 0046 09/03/18 0206  NA 142 143 146*  K 4.1 4.4 2.8*  CL 114* 114* 105  CO2 21* 21* 26  GLUCOSE 146* 162* 87  BUN 22 26* 19  CREATININE 1.23 1.27* 1.23  CALCIUM 7.8* 8.0* 8.6*  GFRNONAA 56* 54* 56*  GFRAA >60 >60 >60  ANIONGAP _0 Hematology Recent Labs  Lab 09/01/18 0046 09/02/18 0541 09/03/18 0206  WBC 13.9* 16.6* 19.2*  RBC 2.92* 2.88* 3.06*  HGB 9.3* 9.4* 9.9*  HCT 29.9* 29.3* 31.3*  MCV 102.4* 101.7* 102.3*  MCH 31.8 32.6 32.4  MCHC 31.1 32.1 31.6  RDW 14.6 14.6 14.6  PLT 269 322 363    Cardiac Enzymes Recent Labs  Lab 08/29/18 0353 08/29/18 0708 08/29/18 1201 08/31/18 1027  TROPONINI 3.59* 6.39* 15.82* 12.47*   No results for input(s): TROPIPOC in the last 168 hours.    Radiology    [facility]  Result  Date: 09/02/2018 CLINICAL DATA:  Acute respiratory distress. EXAM: PORTABLE CHEST 1 VIEW COMPARISON:  One-view chest x-ray 09/01/2018 FINDINGS: The heart is enlarged. Patient has been extubated. NG tube was removed. A right IJ line remains. Atherosclerotic changes are present at the aortic arch. Diffuse interstitial edema is stable. Bibasilar airspace disease is worse on the left. IMPRESSION: 1. Interval extubation and removal of NG tube. 2. Stable cardiomegaly and edema consistent with congestive heart failure. 3. Aortic atherosclerosis. Electronically Signed   By: San Morelle M.D.   On: 09/02/2018 07:29    Cardiac Studies   Study Conclusions  - Left ventricle: The cavity size was normal. Systolic function was   normal. The estimated ejection fraction was in the range of 60%   to 65%. Images were inadequate for LV wall  motion assessment. The   study is not technically sufficient to allow evaluation of LV   diastolic function. - Aortic valve: Valve mobility was moderately restricted.   Suboptimal image quality limited Doppler assessment of aortic   valve systolic gradient. There was no regurgitation. - Mitral valve: Moderately calcified annulus. There was trivial   regurgitation. - Left atrium: The atrium was moderately dilated. - Tricuspid valve: There was trivial regurgitation.   Patient Profile     78 y.o. 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 We are involved for management of his atrial fib.   Assessment & Plan    Atrial fib:   Continue heparin and amiodarone while NPO then transition to xarelto/eliquis and beta blocker when taking PO    Elevated Troponin/NSTEMI: EF was OK on echo.   Medical management. Continue IV Lasix.  Needs to mobilize quite a bit of fluid.  Positive 11 liters this admission.  Good 3 plus liter diuresis yesterday Still Very volume overloaded on exam    For questions or updates, please contact [place] Please consult [email or web address] for contact info under Cardiology/STEMI.   Signed, Jenkins Rouge, MD  09/03/2018, 8:19 AM

## 2018-09-03 NOTE — Progress Notes (Signed)
ANTICOAGULATION CONSULT NOTE   Pharmacy Consult for Eliquis Indication: atrial fibrillation  Patient Measurements: Height: 5\' 9"  (175.3 cm) Weight: (!) 301 lb 13 oz (136.9 kg) IBW/kg (Calculated) : 70.7 Heparin Dosing Weight: 105.9 kg  Vital Signs: Temp: 99.1 F (37.3 C) (01/01 0800) Temp Source: Axillary (01/01 0800) BP: 123/104 (01/01 1200) Pulse Rate: 93 (01/01 1200)  Labs: Recent Labs    09/01/18 0046  09/02/18 0541 09/02/18 1200 09/02/18 1854 09/03/18 0206 09/03/18 0320  HGB 9.3*  --  9.4*  --   --  9.9*  --   HCT 29.9*  --  29.3*  --   --  31.3*  --   PLT 269  --  322  --   --  363  --   HEPARINUNFRC  --    < >  --  0.32 <0.10*  --  0.58  CREATININE 1.27*  --   --   --   --  1.23  --    < > = values in this interval not displayed.    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 Found to have Afib RVR on 12/26. Currently on heparin, planning to change to Eliquis. Age/weight/renal function will qualify patient for normal 5 mg dose. H/h and plts are stable. Incidentally noticed white count is up to 19.2.   Goal of Therapy:  Heparin level 0.3-0.5 units/ml Monitor platelets by anticoagulation protocol: Yes    Plan:  -Stop heparin infusion -Begin Eliquis 5 mg po bid -Discussed with RN   Baldemar Friday 09/03/2018 12:16 PM

## 2018-09-03 NOTE — Evaluation (Signed)
Physical Therapy Evaluation Patient Details Name: Darren Frazier MRN: [id number] DOB: November 05, 1940 Today's Date: 09/03/2018   History of Present Illness  78 yo male  PMH including Cardiomyopathy (HCC), DM, Dysrhythmia, AAA, Hyperlipidemia, HTN, Hypothyroidism, Morbid obesity PVD, Stroke (2011), Tremors of nervous system. Presented to [facility] 12/25 with Rt leg numbness and weakness.  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  He was transferred to [facility].  He was intubated for neuro-IR intervention 12/25, self-extubated 12/30. MRI head left MCA/ACA territory scattered small acute infarcts; multiple scattered chronic underlying cortical infarcts involving the bilateral cerebral and right cerebellar hemispheres; moderately advanced cerebral atrophy with chronic small vessel.   Clinical Impression  Pt admitted with above. Pt sedentary PTA however was able to ambulate and transfer without assist. Pt now with severe R hemiplegia requiring maxAX2 for all mobility. Pt will need extensive rehab to progress mobility, recommend SNF upon d/c.    Follow Up Recommendations SNF    Equipment Recommendations  Wheelchair (measurements PT);Wheelchair cushion (measurements PT)    Recommendations for Other Services       Precautions / Restrictions Precautions Precautions: Fall Restrictions Weight Bearing Restrictions: No      Mobility  Bed Mobility Overal bed mobility: Needs Assistance Bed Mobility: Supine to Sit;Sit to Sidelying;Rolling Rolling: Max assist;+2 for physical assistance;+2 for safety/equipment   Supine to sit: Max assist;+2 for safety/equipment;+2 for physical assistance   Sit to sidelying: Max assist;+2 for physical assistance;+2 for safety/equipment General bed mobility comments: close to total A for all aspects of bed mobility - Pt does attempt to assist with L side +2 assist necessary for physical assist and safety  Transfers                 General transfer comment: NT this session -  at this time only safe with lift equipment  Ambulation/Gait             General Gait Details: NT  Stairs            Wheelchair Mobility    Modified Rankin (Stroke Patients Only) Modified Rankin (Stroke Patients Only) Pre-Morbid Rankin Score: No significant disability Modified Rankin: Severe disability     Balance Overall balance assessment: Needs assistance Sitting-balance support: Single extremity supported;Feet supported Sitting balance-Leahy Scale: Poor Sitting balance - Comments: dependent on external assist - Pt initially pushing with LUE on end bed rail mod A for seated balance over all                                     Pertinent Vitals/Pain Pain Assessment: No/denies pain    Home Living Family/patient expects to be discharged to:: Private residence Living Arrangements: Spouse/significant other Available Help at Discharge: Family;Available 24 hours/day Type of Home: Other(Comment)(Condo) Home Access: Level entry     Home Layout: One level Home Equipment: None      Prior Function Level of Independence: Independent         Comments: not much activity - spends a lot of time in lift chair, doing sponge baths to save energy     Hand Dominance   Dominant Hand: Right    Extremity/Trunk Assessment   Upper Extremity Assessment Upper Extremity Assessment: Defer to OT evaluation RUE Deficits / Details: trace movements in, blistering and pitting edema RUE Sensation: decreased light touch;decreased proprioception RUE Coordination: decreased fine motor;decreased gross motor LUE Deficits / Details: discoordination of movement, strength [facility]  LUE Sensation: decreased proprioception LUE Coordination: decreased gross motor;decreased fine motor    Lower Extremity Assessment Lower Extremity Assessment: RLE deficits/detail RLE Deficits / Details: no active movement, swelling, no withdrawl to pain    Cervical / Trunk Assessment Cervical  / Trunk Assessment: Other exceptions Cervical / Trunk Exceptions: morbid obesity  Communication   Communication: Expressive difficulties  Cognition Arousal/Alertness: Lethargic Behavior During Therapy: Flat affect Overall Cognitive Status: Impaired/Different from baseline Area of Impairment: Attention;Memory;Following commands;Safety/judgement;Awareness;Problem solving                   Current Attention Level: Focused Memory: Decreased recall of precautions;Decreased short-term memory Following Commands: Follows one step commands inconsistently;Follows one step commands with increased time Safety/Judgement: Decreased awareness of safety;Decreased awareness of deficits Awareness: Intellectual Problem Solving: Slow processing;Decreased initiation;Difficulty sequencing;Requires verbal cues;Requires tactile cues General Comments: unable to provide home information, answering some basic questions      General Comments General comments (skin integrity, edema, etc.): pt with seeping and blisters/open blisters on R UE/forearm    Exercises     Assessment/Plan    PT Assessment Patient needs continued PT services  PT Problem List Decreased strength;Decreased range of motion;Decreased activity tolerance;Decreased balance;Decreased mobility;Decreased coordination;Decreased cognition;Decreased knowledge of use of DME       PT Treatment Interventions DME instruction;Gait training;Functional mobility training;Therapeutic activities;Therapeutic exercise;Balance training;Neuromuscular re-education;Cognitive remediation    PT Goals (Current goals can be found in the Care Plan section)  Acute Rehab PT Goals Patient Stated Goal: none stated PT Goal Formulation: With patient/family Time For Goal Achievement: 09/17/18 Potential to Achieve Goals: Fair    Frequency Min 3X/week   Barriers to discharge Decreased caregiver support wife unable to provide maxA    Co-evaluation PT/OT/SLP  Co-Evaluation/Treatment: Yes Reason for Co-Treatment: Complexity of the patient's impairments (multi-system involvement) PT goals addressed during session: Mobility/safety with mobility OT goals addressed during session: ADL's and self-care;Strengthening/ROM       AM-PAC PT "6 Clicks" Mobility  Outcome Measure Help needed turning from your back to your side while in a flat bed without using bedrails?: Total Help needed moving from lying on your back to sitting on the side of a flat bed without using bedrails?: Total Help needed moving to and from a bed to a chair (including a wheelchair)?: Total Help needed standing up from a chair using your arms (e.g., wheelchair or bedside chair)?: Total Help needed to walk in hospital room?: Total Help needed climbing 3-5 steps with a railing? : Total 6 Click Score: 6    End of Session Equipment Utilized During Treatment: Oxygen Activity Tolerance: Patient tolerated treatment well Patient left: in bed;with call bell/phone within reach;with family/visitor present(in chair position) Nurse Communication: Mobility status PT Visit Diagnosis: Unsteadiness on feet (R26.81);Difficulty in walking, not elsewhere classified (R26.2)    Time: 1127-1206 PT Time Calculation (min) (ACUTE ONLY): 39 min   Charges:   PT Evaluation $PT Eval Moderate Complexity: 1 Mod PT Treatments $Therapeutic Activity: 8-22 mins        Darren Frazier, PT, DPT Acute Rehabilitation Services Pager #: [phone] Office #: [phone]   Darren Frazier 09/03/2018, 1:57 PM

## 2018-09-03 NOTE — Progress Notes (Signed)
  Speech Language Pathology Treatment: Dysphagia  Patient Details Name: Darren Frazier MRN: [id number] DOB: 1941/08/02 Today's Date: 09/03/2018 Time: 1030-1057 SLP Time Calculation (min) (ACUTE ONLY): 27 min  Assessment / Plan / Recommendation Clinical Impression  Pt demonstrates improved swallow function today. He is alert and participatory with appropriate cues to compensate for mild receptive language impairment and attention deficit. He is obese and short of breath at times, often with an expiratory grunt. Pt also with intermittent coughing with expectoration of mucous at baseline. His voice is clear however and cough response is strong. When give small and consecutive straw sips of water there are no signs of aspiration, though again an grunt after swallows is often present. Pt also masticated solids without difficulty. Pt will need assist with meals due to cognitive deficits and poor positioning. No SLP f/u for swallowing needed.   HPI HPI: 78 yo male presented to [facility] 12/25 with Rt leg numbness and weakness.  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  He was transferred to [facility].  He was intubated for neuro-IR intervention 12/25, self-extubated 12/30. MRI head left MCA/ACA territory scattered small acute infarcts; multiple scattered chronic underlying cortical infarcts involving the bilateral cerebral and right cerebellar hemispheres; moderately advanced cerebral atrophy with chronic small vessel.       SLP Plan  Continue with current plan of care       Recommendations  Diet recommendations: Regular;Thin liquid Liquids provided via: Cup;Straw Medication Administration: Whole meds with liquid Supervision: Staff to assist with self feeding;Full supervision/cueing for compensatory strategies;Trained caregiver to feed patient Compensations: Slow rate;Small sips/bites;Minimize environmental distractions Postural Changes and/or Swallow Maneuvers: Seated upright 90 degrees                Oral  Care Recommendations: Oral care BID SLP Visit Diagnosis: Dysphagia, unspecified (R13.10) Plan: Continue with current plan of care       GO               Harlon Ditty, MA CCC-SLP  Acute Rehabilitation Services Pager [phone] Office [phone]  Darren Frazier 09/03/2018, 11:01 AM

## 2018-09-03 NOTE — Progress Notes (Signed)
NAME:  Darren Frazier, MRN:  [id number], DOB:  May 19, 1941, LOS: 7 ADMISSION DATE:  08/27/2018, CONSULTATION DATE: 08/27/2018 REFERRING MD:  Dr. Corliss Skains, CHIEF COMPLAINT:  Rt leg weakness   Brief History   78 yo male presented to [facility] with Rt leg numbness and weakness.  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  He was transferred to [facility].  He was intubated for neuro-IR intervention.  Past Medical History  A fib on pradaxa, HTN, Urethral stricture, cardiomyopathy, PAD, HLD, CVA, lymph edema, Vitamin D deficiency, Neuropathy, Hypothyroidism, DM  Significant Hospital Events   12/25 Admit, arteriogram by IR 12/26 A fib with RVR elevated troponin, progressive anemia, hypotension requiring pressors 12/27 Transfuse 1 unit PRBC, change to pressure control 12/28 vomiting, tube feeds held 12/29 improving ileus, feeds resumed. 12/30 self-extubation, rapid Afib - started on IV amiodarone. 01/01 Passed swallow evaluation for regular thin liquid  Consults:  Neurology Neuro IR Cardiology  Procedures:  ETT 12/25 >>  Rt IJ CVL 12/27 >>  Significant Diagnostic Tests:  CT angio head/neck 12/25 >> occlusion of Lt ICA Echo 12/26 >> EF 60 to 65% CT abd/pelvis 12/27 >> small hematoma Rt inguinal region, b/l lower lung AD, ileus CXR 12/30 >> poor inspiratory effort, bilateral patchy infiltrates R>L  Micro Data:  Sputum 12/26 >> normal flora   Antimicrobials:  Zosyn 12/27 >> 12/29  Interim history/subjective:  No acute events overnight.  Pt passed SLP evaluation for diet.    Objective   Blood pressure 133/65, pulse 94, temperature 100 F (37.8 C), temperature source Oral, resp. rate 20, height 5\' 9"  (1.753 m), weight (!) 136.9 kg, SpO2 100 %.        Intake/Output Summary (Last 24 hours) at 09/03/2018 1615 Last data filed at 09/03/2018 1500 Gross per 24 hour  Intake 987.69 ml  Output 5650 ml  Net -4662.31 ml   Filed Weights   09/01/18 0500 09/02/18 0500 09/03/18 0500  Weight: (!) 145.7 kg  129.8 kg (!) 136.9 kg    Examination: General: obese elderly male lying in bed in NAD   HEENT: MM pink/moist, IJ TLC intact Neuro: Awakens to voice, speech clear, mild confusion, right hemiplegia CV: s1s2 rrr, no m/r/g PULM: even/non-labored, lungs bilaterally clear anterior, diminished bases with crackles  MY:TRZN, non-tender, bsx4 active  Extremities: warm/dry, 2-3+ pitting edema  Skin: no rashes or lesions  Resolved Hospital Problem list   Respiratory failure  Assessment & Plan:    Ischemic CVA from Lt ICA occlusion.  Persistent R hemiplegia. P: Per Neurology   Acute Hypoxic Respiratory Failure  -in setting of volume overload / pulmonary edema  P: Wean O2 for sats >90% Pulmonary hygiene -IS, mobilize Lasix as renal function / BP permit   Encephalopathy  -ongoing due to multifactorial delirium due to sedatives and recent stroke P: Improving  PT/OT efforts   Atrial fibrillation with RVR P: Tele monitoring  Cardiology following  Transition to oral amiodarone in am > orders in place   Dysphagia  P:  SLP following   Hypothyroidism  P: Transition to PO synthroid   Hypokalemia  Hypomagnesemia  P: Replaced, follow up in am  Best practice:  Diet: NPO - Cortrak DVT prophylaxis: Systemic anticoagulation. GI prophylaxis: not indicated Mobility: Bed rest- progressive ambulation. Code Status: Full code Family Communication: No family at bedside 1/1 on NP rounds.    PCCM will be available PRN. Please call back if new needs arise.    Labs    CMP Latest Ref  Rng & Units 09/03/2018 09/01/2018 08/31/2018  Glucose 70 - 99 mg/dL 87 660(Y) 301(S)  BUN 8 - 23 mg/dL 19 01(U) 22  Creatinine 0.61 - 1.24 mg/dL 9.32 3.55(D) 3.22  Sodium 135 - 145 mmol/L 146(H) 143 142  Potassium 3.5 - 5.1 mmol/L 2.8(L) 4.4 4.1  Chloride 98 - 111 mmol/L 105 114(H) 114(H)  CO2 22 - 32 mmol/L 26 21(L) 21(L)  Calcium 8.9 - 10.3 mg/dL 0.2(R) 4.2(H) 7.8(L)  Total Protein 6.4 - 8.2 g/dL - -  -  Total Bilirubin 0.2 - 1.0 mg/dL - - -  Alkaline Phos 50 - 136 Unit/L - - -  AST 15 - 37 Unit/L - - -  ALT 12 - 78 U/L - - -   CBC Latest Ref Rng & Units 09/03/2018 09/02/2018 09/01/2018  WBC 4.0 - 10.5 K/uL 19.2(H) 16.6(H) 13.9(H)  Hemoglobin 13.0 - 17.0 g/dL 0.6(C) 3.7(S) 2.8(B)  Hematocrit 39.0 - 52.0 % 31.3(L) 29.3(L) 29.9(L)  Platelets 150 - 400 K/uL 363 322 269   ABG    Component Value Date/Time   PHART 7.396 09/02/2018 0754   PCO2ART 35.4 09/02/2018 0754   PO2ART 169.0 (H) 09/02/2018 0754   HCO3 21.7 09/02/2018 0754   TCO2 23 09/02/2018 0754   ACIDBASEDEF 3.0 (H) 09/02/2018 0754   O2SAT 100.0 09/02/2018 0754   CBG (last 3)  Recent Labs    09/02/18 2310 09/03/18 0309 09/03/18 0738  GLUCAP 130* 136* 126*     Canary Brim, NP-C [facility] Pgr: [phone] or if no answer [phone] 09/03/2018, 4:15 PM

## 2018-09-03 NOTE — Evaluation (Signed)
Occupational Therapy Evaluation Patient Details Name: Darren Frazier MRN: [id number] DOB: Dec 05, 1940 Today's Date: 09/03/2018    History of Present Illness 78 yo male  PMH including Cardiomyopathy (HCC), DM, Dysrhythmia, AAA, Hyperlipidemia, HTN, Hypothyroidism, Morbid obesity PVD, Stroke (2011), Tremors of nervous system. Presented to [facility] 12/25 with Rt leg numbness and weakness.  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  He was transferred to [facility].  He was intubated for neuro-IR intervention 12/25, self-extubated 12/30. MRI head left MCA/ACA territory scattered small acute infarcts; multiple scattered chronic underlying cortical infarcts involving the bilateral cerebral and right cerebellar hemispheres; moderately advanced cerebral atrophy with chronic small vessel.    Clinical Impression   PTA Pt was living stationary life style (spending most of the time in his lift chair, weekly self-done sponge baths) but independent. Pt is currently max A +2 to total A for all ADL at this time. Pt is able to use LUE with decreased coordination and initiation, however good strength. Pt was able to perform bed mobility at max A +2 and sit EOB with assist. Pt presents with decreased cognition, vision (right superior tracking difficulties), R sided weakness (trace movements) and will require skilled OT in the acute setting as well as afterwards at the SNF level. Pt and wife are from [place] and are interested in seeking a rehab facility closer to home. OT will continue to follow acutely and will focus on functional use of LUE for ADL tasks as well as continued assessment/challenge of RUE and vision.     Follow Up Recommendations  SNF;Supervision/Assistance - 24 hour    Equipment Recommendations  Other (comment)(defer to next venue)    Recommendations for Other Services       Precautions / Restrictions Precautions Precautions: Fall Restrictions Weight Bearing Restrictions: No      Mobility Bed  Mobility Overal bed mobility: Needs Assistance Bed Mobility: Supine to Sit;Sit to Sidelying;Rolling Rolling: Max assist;+2 for physical assistance;+2 for safety/equipment   Supine to sit: Max assist;+2 for safety/equipment;+2 for physical assistance   Sit to sidelying: Max assist;+2 for physical assistance;+2 for safety/equipment General bed mobility comments: close to total A for all aspects of bed mobility - Pt does attempt to assist with L side +2 assist necessary for physical assist and safety  Transfers                 General transfer comment: NT this session - at this time only safe with lift equipment    Balance Overall balance assessment: Needs assistance Sitting-balance support: Single extremity supported;Feet supported Sitting balance-Leahy Scale: Poor Sitting balance - Comments: dependent on external assist - Pt initially pushing with LUE on end bed rail mod A for seated balance over all                                   ADL either performed or assessed with clinical judgement   ADL Overall ADL's : Needs assistance/impaired Eating/Feeding: Moderate assistance;Bed level   Grooming: Moderate assistance;Bed level   Upper Body Bathing: Maximal assistance   Lower Body Bathing: Total assistance   Upper Body Dressing : Maximal assistance   Lower Body Dressing: Total assistance   Toilet Transfer: Total assistance   Toileting- Clothing Manipulation and Hygiene: Total assistance         General ADL Comments: Pt is able to use non-dominant LUE for grooming/feeding tasks but uncoordinated and requires significant verbal and physical  cues     Vision   Vision Assessment?: Yes;Vision impaired- to be further tested in functional context Eye Alignment: Within Functional Limits Alignment/Gaze Preference: Within Defined Limits Tracking/Visual Pursuits: Requires cues, head turns, or add eye shifts to track;Impaired - to be further tested in functional  context;Decreased smoothness of eye movement to RIGHT superior field Additional Comments: hard for Pt to keep eyes open throughout testing     Perception     Praxis      Pertinent Vitals/Pain Pain Assessment: No/denies pain     Hand Dominance Right   Extremity/Trunk Assessment Upper Extremity Assessment Upper Extremity Assessment: RUE deficits/detail;LUE deficits/detail RUE Deficits / Details: trace movements in, blistering and pitting edema RUE Sensation: decreased light touch;decreased proprioception RUE Coordination: decreased fine motor;decreased gross motor LUE Deficits / Details: discoordination of movement, strength WFL LUE Sensation: decreased proprioception LUE Coordination: decreased gross motor;decreased fine motor   Lower Extremity Assessment Lower Extremity Assessment: Defer to PT evaluation   Cervical / Trunk Assessment Cervical / Trunk Assessment: Other exceptions Cervical / Trunk Exceptions: morbid obesity   Communication Communication Communication: Expressive difficulties   Cognition Arousal/Alertness: Lethargic Behavior During Therapy: Flat affect Overall Cognitive Status: Impaired/Different from baseline Area of Impairment: Attention;Memory;Following commands;Safety/judgement;Awareness;Problem solving                   Current Attention Level: Focused Memory: Decreased recall of precautions;Decreased short-term memory Following Commands: Follows one step commands inconsistently;Follows one step commands with increased time Safety/Judgement: Decreased awareness of safety;Decreased awareness of deficits Awareness: Intellectual Problem Solving: Slow processing;Decreased initiation;Difficulty sequencing;Requires verbal cues;Requires tactile cues General Comments: unable to provide home information, answering some basic questions   General Comments  Wife present throughout session    Exercises     Shoulder Instructions      Home Living  Family/patient expects to be discharged to:: Private residence Living Arrangements: Spouse/significant other Available Help at Discharge: Family;Available 24 hours/day Type of Home: Other(Comment)(Condo) Home Access: Level entry     Home Layout: One level     Bathroom Shower/Tub: Producer, television/film/video: Standard     Home Equipment: None          Prior Functioning/Environment Level of Independence: Independent        Comments: not much activity - spends a lot of time in lift chair, doing sponge baths to save energy        OT Problem List: Decreased strength;Decreased range of motion;Decreased activity tolerance;Impaired balance (sitting and/or standing);Impaired vision/perception;Decreased coordination;Decreased cognition;Decreased safety awareness;Decreased knowledge of use of DME or AE;Impaired sensation;Obesity;Impaired UE functional use;Increased edema      OT Treatment/Interventions: Self-care/ADL training;Therapeutic exercise;Neuromuscular education;DME and/or AE instruction;Manual therapy;Therapeutic activities;Visual/perceptual remediation/compensation;Cognitive remediation/compensation;Patient/family education;Balance training    OT Goals(Current goals can be found in the care plan section) Acute Rehab OT Goals Patient Stated Goal: none stated OT Goal Formulation: Patient unable to participate in goal setting ADL Goals Pt Will Perform Eating: with min assist;sitting Pt Will Perform Grooming: with min assist;with caregiver independent in assisting Additional ADL Goal #1: Pt will perform bed mobility at max A prior to engaging in ADL activity Additional ADL Goal #2: Pt will sit EOB as activity tolerance precursor for ADL participation at min A for at least 5 min  OT Frequency: Min 2X/week   Barriers to D/C:            Co-evaluation PT/OT/SLP Co-Evaluation/Treatment: Yes Reason for Co-Treatment: Complexity of the patient's impairments (multi-system  involvement);Necessary to address cognition/behavior  during functional activity;For patient/therapist safety;To address functional/ADL transfers PT goals addressed during session: Mobility/safety with mobility;Balance;Strengthening/ROM OT goals addressed during session: ADL's and self-care;Strengthening/ROM      AM-PAC OT "6 Clicks" Daily Activity     Outcome Measure Help from another person eating meals?: A Lot Help from another person taking care of personal grooming?: A Lot Help from another person toileting, which includes using toliet, bedpan, or urinal?: Total Help from another person bathing (including washing, rinsing, drying)?: A Lot Help from another person to put on and taking off regular upper body clothing?: A Lot Help from another person to put on and taking off regular lower body clothing?: Total 6 Click Score: 10   End of Session Equipment Utilized During Treatment: Oxygen Nurse Communication: Mobility status;Need for lift equipment  Activity Tolerance: Patient tolerated treatment well Patient left: in bed;with call bell/phone within reach;with family/visitor present;with bed alarm set;with SCD's reapplied;with restraints reapplied  OT Visit Diagnosis: Unsteadiness on feet (R26.81);Other abnormalities of gait and mobility (R26.89);Muscle weakness (generalized) (M62.81);Other symptoms and signs involving the nervous system (R29.898);Other symptoms and signs involving cognitive function;Hemiplegia and hemiparesis Hemiplegia - Right/Left: Right Hemiplegia - dominant/non-dominant: Dominant Hemiplegia - caused by: Cerebral infarction                Time: 1128-1206 OT Time Calculation (min): 38 min Charges:  OT General Charges $OT Visit: 1 Visit OT Evaluation $OT Eval Moderate Complexity: 1 Mod  Sherryl Manges OTR/L Acute Rehabilitation Services Pager: [phone] Office: [phone]  Darren Frazier 09/03/2018, 1:47 PM

## 2018-09-03 NOTE — Progress Notes (Signed)
STROKE TEAM PROGRESS NOTE   SUBJECTIVE (INTERVAL HISTORY) His RN and wife are at the bedside.  Patient more awake alert, breathing less distressful, passing swallow, will transition from heparin to Eliquis.  Anasarca  much improved with IV Lasix.  Will continue.  Amiodarone IV continued by cardiology.  OBJECTIVE Temp:  [99.1 F (37.3 C)-100 F (37.8 C)] 99.7 F (37.6 C) (01/01 1600) Pulse Rate:  [83-107] 102 (01/01 2000) Cardiac Rhythm: Atrial fibrillation (01/01 2000) Resp:  [17-27] 26 (01/01 2000) BP: (114-146)/(49-104) 127/82 (01/01 2000) SpO2:  [95 %-100 %] 97 % (01/01 2000) Weight:  [136.9 kg] 136.9 kg (01/01 0500)  Recent Labs  Lab 09/02/18 1543 09/02/18 1910 09/02/18 2310 09/03/18 0309 09/03/18 0738  GLUCAP 113* 105* 130* 136* 126*   Recent Labs  Lab 08/28/18 0921 08/28/18 1826 08/29/18 0416 08/29/18 1905 08/30/18 0655 08/31/18 1027 09/01/18 0046 09/03/18 0206 09/03/18 1626  NA  --   --  136  --  137 142 143 146* 144  K  --   --  4.1  --  4.1 4.1 4.4 2.8* 2.8*  CL  --   --  103  --  107 114* 114* 105 105  CO2  --   --  21*  --  19* 21* 21* 26 26  GLUCOSE  --   --  232*  --  191* 146* 162* 87 155*  BUN  --   --  11  --  18 22 26* 19 18  CREATININE  --   --  1.23  --  1.18 1.23 1.27* 1.23 1.13  CALCIUM  --   --  7.9*  --  7.6* 7.8* 8.0* 8.6* 8.1*  MG 1.5* 1.5* 1.6* 1.7  --   --   --  1.8  --   PHOS 3.4 3.3 3.2 2.2*  --   --   --   --   --    No results for input(s): AST, ALT, ALKPHOS, BILITOT, PROT, ALBUMIN in the last 168 hours. Recent Labs  Lab 08/28/18 0600  08/30/18 0545 08/31/18 0517 09/01/18 0046 09/02/18 0541 09/03/18 0206  WBC 16.1*   < > 15.5* 11.4* 13.9* 16.6* 19.2*  NEUTROABS 12.4*  --   --   --   --   --   --   HGB 9.2*   < > 10.3* 9.3* 9.3* 9.4* 9.9*  HCT 28.6*   < > 31.9* 29.4* 29.9* 29.3* 31.3*  MCV 99.0   < > 101.3* 104.6* 102.4* 101.7* 102.3*  PLT 229   < > 251 227 269 322 363   < > = values in this interval not displayed.    Recent Labs  Lab 08/29/18 0353 08/29/18 0708 08/29/18 1201 08/31/18 1027  TROPONINI 3.59* 6.39* 15.82* 12.47*   No results for input(s): LABPROT, INR in the last 72 hours. Recent Labs    09/02/18 1135  COLORURINE STRAW*  LABSPEC 1.010  PHURINE 5.0  GLUCOSEU NEGATIVE  HGBUR SMALL*  BILIRUBINUR NEGATIVE  KETONESUR NEGATIVE  PROTEINUR NEGATIVE  NITRITE NEGATIVE  LEUKOCYTESUR NEGATIVE       Component Value Date/Time   CHOL 138 08/28/2018 0600   TRIG 171 (H) 08/28/2018 0921   HDL 24 (L) 08/28/2018 0600   CHOLHDL 5.8 08/28/2018 0600   VLDL 33 08/28/2018 0600   LDLCALC 81 08/28/2018 0600   Lab Results  Component Value Date   HGBA1C 7.1 (H) 08/28/2018   No results found for: LABOPIA, COCAINSCRNUR, LABBENZ, AMPHETMU, THCU, LABBARB  No results for input(s): ETH in the last 168 hours.  I have personally reviewed the radiological images below and agree with the radiology interpretations.  Ct Angio Head W Or Wo Contrast  Result Date: 08/27/2018 CLINICAL DATA:  Acute presentation with speech disturbance. EXAM: CT ANGIOGRAPHY HEAD AND NECK TECHNIQUE: Multidetector CT imaging of the head and neck was performed using the standard protocol during bolus administration of intravenous contrast. Multiplanar CT image reconstructions and MIPs were obtained to evaluate the vascular anatomy. Carotid stenosis measurements (when applicable) are obtained utilizing NASCET criteria, using the distal internal carotid diameter as the denominator. CONTRAST:  75mL ISOVUE-370 IOPAMIDOL (ISOVUE-370) INJECTION 76% COMPARISON:  CT same day. FINDINGS: CTA NECK FINDINGS Aortic arch: Aortic atherosclerosis.  No aneurysm or dissection. Right carotid system: Common carotid artery shows some atherosclerotic plaque but is widely patent to the bifurcation. There is soft and calcified plaque affecting the carotid bifurcation and ICA bulb region. There is severe stenosis in the distal bulb region with luminal  diameter of 1 mm. The vessel is quite tortuous beyond that but widely patent to the upper cervical region, where there is soft and calcified plaque resulting in minimal diameter of 3.5 mm. Compared to an expected diameter of 5 mm, stenosis in the bulb is 80% or greater. Stenosis in the upper cervical region is 30%. Left carotid system: Common carotid artery shows atherosclerotic plaque but is sufficiently patent to the bifurcation region. There is calcified and soft plaque at the carotid bifurcation and ICA bulb. There is left internal carotid artery occlusion at the distal bulb. 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 The left vertebral artery shows 50% stenosis at its origin but is sufficiently patent beyond that through the cervical region to the foramen magnum. Skeleton: Mid cervical spondylosis. Other neck: No mass or lymphadenopathy. Upper chest: Interstitial prominence which could go along with fluid overload or mild edema. Review of the MIP images confirms the above findings CTA HEAD FINDINGS Anterior circulation: Left internal carotid artery is occluded without antegrade flow through the skull base. Right internal carotid artery shows atherosclerotic disease in the carotid siphon region with stenosis estimated at 50-70%. The anterior and middle cerebral vessels are patent. There is atherosclerotic irregularity of the M1 segment with stenosis of 50-70%. No acute vessel occlusion is identified. There is a chronic punctate calcified embolus in 1 of the insular branches, but this was present in 2014. On the left, there is reconstituted flow in the distal siphon. Severe stenosis of the supraclinoid ICA, 80% or greater. Flow is present in the anterior and middle cerebral vessels, secondary to reconstituted flow and flow through communicating arteries. There is 50% stenosis in the M1 segment. I do not see any occluded or missing large or medium vessels in the MCA territory.  Posterior circulation: Right vertebral artery shows no antegrade flow at the foramen magnum. There is retrograde flow in the distal right vertebral. Left vertebral artery is patent at the foramen magnum. There is atherosclerotic disease of the V4 segment with stenosis estimated at 70%. There are serial stenoses. The vessel does show flow to the basilar, which shows atherosclerotic irregularity but is patent. Flow is present in the superior cerebellar and posterior cerebral arteries. Venous sinuses: Patent and normal. Anatomic variants: None significant. Delayed phase: No abnormal enhancement. 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 Severe irregular atherosclerotic disease of the right carotid bifurcation  with stenosis of 80% in the bulb region and 30% in the distal ICA. 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 50% stenosis of the left vertebral artery origin. Severe atherosclerotic disease in both carotid siphon regions. Right siphon stenosis estimated at 50-70%. Reconstituted flow in the distal left siphon and supraclinoid region. 80% supraclinoid stenosis. Flow in both anterior and middle cerebral artery territories. Stenosis of both M1 segments estimated at 50-70%. I do not identify any large or medium vessel occlusion acutely within the MCA branch vessels. Severely disease left V4 segment with serial stenoses proximal to the basilar estimated at 70%. Basilar atherosclerotic irregularity without flow limiting stenosis. Posterior circulation branch vessels do show flow, including right PICA which is supplied by retrograde flow in the distal right vertebral artery. Electronically Signed   By: Paulina Fusi M.D.   On: 08/27/2018 13:14   Dg Chest 2 View  Result Date: 08/27/2018 CLINICAL DATA:  Chest pain and shortness of breath.  Cardiomyopathy. EXAM: CHEST - 2 VIEW COMPARISON:  03/07/2013 FINDINGS: Mild cardiomegaly.  Aortic atherosclerosis. Diffuse interstitial infiltrates, consistent with pulmonary edema. Mild subsegmental atelectasis also seen in the left upper lobe. No evidence of pulmonary consolidation or significant pleural effusion. IMPRESSION: Mild cardiomegaly and diffuse interstitial edema, consistent with congestive heart failure. Electronically Signed   By: Myles Rosenthal M.D.   On: 08/27/2018 12:02   Ct Angio Neck W Or Wo Contrast  Result Date: 08/27/2018 CLINICAL DATA:  Acute presentation with speech disturbance. EXAM: CT ANGIOGRAPHY HEAD AND NECK TECHNIQUE: Multidetector CT imaging of the head and neck was performed using the standard protocol during bolus administration of intravenous contrast. Multiplanar CT image reconstructions and MIPs were obtained to evaluate the vascular anatomy. Carotid stenosis measurements (when applicable) are obtained utilizing NASCET criteria, using the distal internal carotid diameter as the denominator. CONTRAST:  75mL ISOVUE-370 IOPAMIDOL (ISOVUE-370) INJECTION 76% COMPARISON:  CT same day. FINDINGS: CTA NECK FINDINGS Aortic arch: Aortic atherosclerosis.  No aneurysm or dissection. Right carotid system: Common carotid artery shows some atherosclerotic plaque but is widely patent to the bifurcation. There is soft and calcified plaque affecting the carotid bifurcation and ICA bulb region. There is severe stenosis in the distal bulb region with luminal diameter of 1 mm. The vessel is quite tortuous beyond that but widely patent to the upper cervical region, where there is soft and calcified plaque resulting in minimal diameter of 3.5 mm. Compared to an expected diameter of 5 mm, stenosis in the bulb is 80% or greater. Stenosis in the upper cervical region is 30%. Left carotid system: Common carotid artery shows atherosclerotic plaque but is sufficiently patent to the bifurcation region. There is calcified and soft plaque at the carotid bifurcation and ICA bulb. There is left  internal carotid artery occlusion at the distal bulb. 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 The left vertebral artery shows 50% stenosis at its origin but is sufficiently patent beyond that through the cervical region to the foramen magnum. Skeleton: Mid cervical spondylosis. Other neck: No mass or lymphadenopathy. Upper chest: Interstitial prominence which could go along with fluid overload or mild edema. Review of the MIP images confirms the above findings CTA HEAD FINDINGS Anterior circulation: Left internal carotid artery is occluded without antegrade flow through the skull base. Right internal carotid artery shows atherosclerotic disease in the carotid siphon region with stenosis estimated at 50-70%. The anterior and middle cerebral vessels are patent. There is atherosclerotic irregularity of the M1 segment  with stenosis of 50-70%. No acute vessel occlusion is identified. There is a chronic punctate calcified embolus in 1 of the insular branches, but this was present in 2014. On the left, there is reconstituted flow in the distal siphon. Severe stenosis of the supraclinoid ICA, 80% or greater. Flow is present in the anterior and middle cerebral vessels, secondary to reconstituted flow and flow through communicating arteries. There is 50% stenosis in the M1 segment. I do not see any occluded or missing large or medium vessels in the MCA territory. Posterior circulation: Right vertebral artery shows no antegrade flow at the foramen magnum. There is retrograde flow in the distal right vertebral. Left vertebral artery is patent at the foramen magnum. There is atherosclerotic disease of the V4 segment with stenosis estimated at 70%. There are serial stenoses. The vessel does show flow to the basilar, which shows atherosclerotic irregularity but is patent. Flow is present in the superior cerebellar and posterior cerebral arteries. Venous sinuses: Patent and normal. Anatomic  variants: None significant. Delayed phase: No abnormal enhancement. 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 Severe irregular atherosclerotic disease of the right carotid bifurcation with stenosis of 80% in the bulb region and 30% in the distal ICA. 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 50% stenosis of the left vertebral artery origin. Severe atherosclerotic disease in both carotid siphon regions. Right siphon stenosis estimated at 50-70%. Reconstituted flow in the distal left siphon and supraclinoid region. 80% supraclinoid stenosis. Flow in both anterior and middle cerebral artery territories. Stenosis of both M1 segments estimated at 50-70%. I do not identify any large or medium vessel occlusion acutely within the MCA branch vessels. Severely disease left V4 segment with serial stenoses proximal to the basilar estimated at 70%. Basilar atherosclerotic irregularity without flow limiting stenosis. Posterior circulation branch vessels do show flow, including right PICA which is supplied by retrograde flow in the distal right vertebral artery. Electronically Signed   By: Paulina FusiMark  Shogry M.D.   On: 08/27/2018 13:14   Mr Brain Wo Contrast  Result Date: 08/28/2018 CLINICAL DATA:  Follow-up examination for acute stroke, right lower extremity weakness. Known left ICA occlusion. EXAM: MRI HEAD WITHOUT CONTRAST TECHNIQUE: Multiplanar, multiecho pulse sequences of the brain and surrounding structures were obtained without intravenous contrast. COMPARISON:  Prior CTs from 08/27/2018. FINDINGS: Brain: Generalized age-related cerebral atrophy. Patchy and confluent T2/FLAIR hyperintensity within the periventricular and deep white matter both cerebral hemispheres most consistent with chronic microvascular ischemic disease, moderate nature. Scatter remote cortical infarcts involving the posterior left frontoparietal region, right  parietal lobe, and right frontal lobe are seen. Small remote right cerebellar infarct. Remote lacunar infarct present at the left caudate head. Scattered chronic hemosiderin staining seen about several of these infarcts. Scattered multifocal foci of restricted diffusion involving the cortical subcortical left frontal, parietal, and temporal occipital regions, compatible with acute ischemic left MCA territory infarcts. Largest area of infarction seen at the high left frontal parietal region and measures 2.5 cm (series 3, image 48). Possible faint petechial hemorrhage about a few of these infarcts without frank hemorrhagic transformation or significant mass effect. Findings likely embolic in nature. No mass lesion, midline shift or mass effect. Mild diffuse ventricular prominence related global parenchymal volume loss without hydrocephalus. No extra-axial fluid collection. Pituitary gland within normal limits. Vascular: Abnormal flow void within the left ICA to its cavernous segment, compatible with previously identified left ICA occlusion. Major  intracranial vascular flow voids otherwise maintained at the skull base. Skull and upper cervical spine: Craniocervical junction within normal limits. Upper cervical spine normal. Bone marrow signal intensity within normal limits. No scalp soft tissue abnormality. Sinuses/Orbits: Patient status post bilateral ocular lens replacement. Globes and orbital soft tissues demonstrate no acute finding. Scattered mucosal thickening seen throughout the paranasal sinuses with superimposed scattered air-fluid levels. Fluid seen within the nasopharynx. Patient likely intubated. Trace bilateral mastoid effusions noted. Inner ear structures normal. Other: None. IMPRESSION: 1. 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 Possible faint scattered associated petechial hemorrhage without frank hemorrhagic transformation or significant mass  effect. 2. Abnormal flow void within the left ICA to its cavernous segment, consistent with known left ICA occlusion. 3. 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 4. Moderately advanced cerebral atrophy with chronic small vessel ischemic disease. Electronically Signed   By: Rise Mu M.D.   On: 08/28/2018 15:33   Ct Abdomen Pelvis W Contrast  Result Date: 08/29/2018 CLINICAL DATA:  Abdominal distention.  Bloating. EXAM: CT ABDOMEN AND PELVIS WITH CONTRAST TECHNIQUE: Multidetector CT imaging of the abdomen and pelvis was performed using the standard protocol following bolus administration of intravenous contrast. CONTRAST:  OMNIPAQUE IOHEXOL 300 MG/ML  SOLN COMPARISON:  Abdominal radiograph dated 08/28/2018 and abdominal ultrasound dated 02/28/2016 FINDINGS: Lower chest: There are bilateral consolidative infiltrates in the lower lobes as well as minimal bilateral pleural effusions. Aortic atherosclerosis. Coronary artery calcifications. Hepatobiliary: Liver parenchyma is normal. No biliary ductal dilatation. Vicarious excretion of contrast in the normal appearing gallbladder. Pancreas: Unremarkable. No pancreatic ductal dilatation or surrounding inflammatory changes. Spleen: Normal in size without focal abnormality. Adrenals/Urinary Tract: Adrenal glands are normal. Normal right kidney. Large chronic cyst in the lower pole of the left kidney with some calcification in the wall. Maximum diameter of the cyst is approximately 9 cm. Stomach/Bowel: NG tube tip in the body of the stomach. There are few diverticula in the proximal sigmoid portion of the colon. There is moderate air in the colon without significant distention. Small bowel appears normal. Fluid and air in the stomach. Appendix has been removed. Vascular/Lymphatic: There is a poorly defined hematoma in the right inguinal region in the panniculus. This  measures approximately 12 x 5 x 2 cm and is probably related to recent angiography. There are several small lymph nodes in the right inguinal region, the largest being 15 mm in diameter. Extensive aortic atherosclerosis. No adenopathy. Reproductive: Dense calcification in the normal-sized prostate gland. Other: There is a small amount of ascites in the abdomen, nonspecific. Small periumbilical hernia containing only fat. Edema in the subcutaneous fat of both flanks, right greater than left. Slight edema in the panniculus and in the lateral aspect of both thighs. Musculoskeletal: No acute abnormalities. Multilevel degenerative disc disease in the thoracic spine and lumbar spine. IMPRESSION: 1. Small hematoma in the right inguinal region in the panniculus, probably secondary to the recent angiography. 2. Bilateral consolidative infiltrates in the posterior aspect of both lower lobes with tiny adjacent pleural effusions. 3. Small amount of nonspecific ascites. 4. Slightly prominent gas in the bowel without evidence of bowel obstruction. This probably represents an ileus. Electronically Signed   By: Francene Boyers M.D.   On: 08/29/2018 17:03   Ir Ct Head Ltd  Result Date: 08/31/2018 CLINICAL DATA:  New onset right-sided weakness with mild word-finding difficulties. Abnormal CT angiogram of the head  and neck revealing non flow limiting filling defect in the left middle cerebral artery M1 segment, and also acute occlusion of the left internal carotid artery at the bulb. EXAM: IR ANGIO INTRA EXTRACRAN SEL COM CAROTID INNOMINATE UNI RIGHT MOD SED; IR PERCUTANEOUS ART THORMBECTOMY/INFUSION INTRACRANIAL INCLUDE DIAG ANGIO; IR ANGIO VERTEBRAL SEL SUBCLAVIAN INNOMINATE UNI LEFT MOD SED; IR CT HEAD LIMITED COMPARISON:  CT angiogram of the head and neck of 08/27/2018 MEDICATIONS: Heparin 3,000 units IV; . Ancef 2 g IV antibiotic was administered within 1 hour of the procedure ANESTHESIA/SEDATION: General anesthesia CONTRAST:   Isovue 300 approximately 100 mL FLUOROSCOPY TIME:  Fluoroscopy Time: 60 minutes 0 seconds (4327 mGy). COMPLICATIONS: None immediate. TECHNIQUE: Informed written consent was obtained from the patient after a thorough discussion of the procedural risks, benefits and alternatives. All questions were addressed. Maximal Sterile Barrier Technique was utilized including caps, mask, sterile gowns, sterile gloves, sterile drape, hand hygiene and skin antiseptic. A timeout was performed prior to the initiation of the procedure. The right groin was prepped and draped in the usual sterile fashion. Thereafter using modified Seldinger technique, transfemoral access into the right common femoral artery was obtained without difficulty. Over a 0.035 inch guidewire, a 5 French Pinnacle sheath was inserted. Through this, and also over 0.035 inch guidewire, a 5 [place] JB 1 catheter was advanced to the aortic arch region and selectively positioned in the right common carotid artery, the innominate artery , the left common carotid artery and the left vertebral artery. FINDINGS: The left common carotid artery tear g demonstrates a moderate tortuosity of the proximal left common carotid artery The left external carotid artery proximally has a mild to moderate stenosis. Is branches are otherwise normally opacified The left internal carotid artery at the bulb would demonstrates the complete angiographic occlusion with no evidence of a string sign on the delayed arterial images. The innominate artery arteriogram demonstrates patency of the right subclavian artery and the right common carotid artery proximally There is no angiographic evidence of the right vertebral artery The right common carotid bifurcation demonstrates the right external carotid artery and its major branches to be widely patent The right internal carotid artery just distal to the bulb has a severe 90% plus stenosis. Distal to this there is a U-shaped configuration of the  right internal carotid artery. More distally the vessel is normal caliber The petrous the cavernous segments and supraclinoid segments are widely patent There is mild atherosclerotic irregularity involving the proximal cavernous, and distal cavernous portions of the right internal carotid artery. Right posterior communicating artery is seen opacifying the right posterior cerebral artery and the distal basilar artery. The right middle cerebral artery has approximately 30 40% stenosis of the right M1 segment The trifurcation branches appear to be widely patent into the capillary and venous phases The right anterior cerebral artery A1 segment and distally demonstrate wide patency is into the capillary and venous phases There is simultaneous cross-filling via the anterior communicating artery of the left anterior cerebral artery A2 segment and distally. Also demonstrated is opacification of the left anterior cerebral A1 segment, with flow noted into the left middle cerebral artery distribution. Unopacified blood is seen in the left middle cerebral artery from the collateral circulation. The left subclavian arteriogram demonstrates mild stenosis at the origin of the right subclavian artery Mild atherosclerotic disease is noted of the subclavian artery and in the region of the thyrocervical trunk The dominant left vertebral artery origin is widely patent The vessel demonstrates  mild tortuosity just distal to this. More distally the vessel is seen to opacify to the cranial skull base There is severe focal of focal stenosis of the left vertebrobasilar junction just distal to the origin of the left posterior-inferior cerebellar artery Additionally there is a approximately 70% stenosis of the proximal basilar artery More distally the basilar artery demonstrates mild caliber irregularity. There is a opacification of the left posterior cerebral artery the superior cerebellar arteries left greater than right, and the  anterior-inferior cerebellar arteries Retrograde opacification of the right vertebrobasilar junction to the level of the right posterior-inferior cerebellar arteries is noted. Caliber irregularity of the right posterior-inferior cerebellar artery suggests intracranial arteriosclerosis. Left internal carotid artery occlusion or revascularization attempt The diagnostic JB 1 catheter left common carotid artery was then exchanged over a 0.035 inch 300 cm a 7 French Pinnacle sheath in the right groin. A diagnostic JB 1 catheter were then advanced over the rise exchange guidewire to the descending thoracic aorta. The 7 French Pinnacle sheath was then exchanged over a 0.035 inch 100 cm Amplatz stiff guidewire for a a 8 French 55 cm Brite tip neurovascular sheath using biplane roadmap technique and constant fluoroscopic guidance. Good good aspiration was obtained from the side port of the neurovascular sheath. This was then connected to continuous heparinized saline infusion. The embolus guidewire was then removed. Through the 8 French neurovascular sheath, over a 0.035 inch roadrunner guidewire, a 5 5 [place] JB 1 catheter was advanced to the recover region and select position in the left common carotid artery. Using biplane technique and constant fluoroscopic guidance, over the of the 5 inch roadrunner guidewire, the 5 [place] JB 1 catheter was advanced to the left external carotid artery and exchanged over a 0.035 inch 300 cm rise exchange guidewire for an 8 French 85 cm flow gap balloon guide catheter which had been prepped with 50% contrast and 50% heparinized saline infusion The guidewire was removed. Good aspiration obtained from the hub of the 8 [place] Flo guide catheter. Gentle contrast injection demonstrated no evidence of spasms dissections or of intraluminal filling defects. At this time, in a coaxial manner and with constant heparinized saline infusion, a tree approached 1 microcatheter was advanced over a point  0.014 inch soft tip synchro micro guidewire to the distal end of the Flo guide catheter The Flood guide catheter was advanced to just inside the left internal carotid artery bulb. Thereafter, multiple attempts were made to advance the microcatheter over the micro guidewire first with Ace synchro soft and then a regular synchro guidewire. In 016 headliner double angled micro guidewire was also utilized in order to access the occluded left internal carotid artery. It was noted that there was a severe tortuosity at the junction of the proximal and the middle 1/3 of the left internal carotid artery Following multiple attempts it was decided to stop. A control was then performed through the [facility] guide catheter in the left common carotid artery which revealed brisk cross-filling of the left internal carotid artery distal cavernous and supraclinoid segments from the left external carotid artery branches via the ipsilateral ophthalmic artery Flow is noted into the left middle cerebral artery as well Also noted was a retrograde opacification from the left posterior communicating artery into the supraclinoid left ICA and then middle cerebral artery An earlier diagnostic catheter arteriogram of the right common carotid artery he had revealed partial cross-filling via the anterior communicating artery from the right internal carotid artery Given the the collateral  circulation, it was decided to stop the procedure. Throughout the procedure, the patient's blood pressure and neurological status remained stable No evidence of hemodynamic instability was noted No gross mass-effect or move filling defects or extravasation was seen Prior to the attempted revascularization of the left internal carotid artery occlusion, the patient was given 180 mg of Brilinta, and 81 mg of aspirin orally the an orogastric tube. Also the patient was loaded with 3000 units IV heparin in order to facilitate revascularization. The the 8 [place] flu guide  catheter was then retrieved and removed. The 8 [place] Brite tip neurovascular sheath was then exchanged over a J-tip guidewire for a in 8 French 35 cm Brite tip neurovascular sheath. This was then connected to continuous heparinized saline infusion An arteriogram performed through this 3 5 cm 8 French Brite tip neurovascular sheath in the abdominal aorta reveal excellent flow through the common iliacs, the external and internal iliacs, and also the visualized portions of the common femoral arteries below the level of the inguinal ligaments. At the end of procedure, a CT of the brain revealed no evidence of gross intracranial hemorrhage,, or mass effect or midline shift. The patient was left intubated on account of his wound at the time of his intubation The right groin appeared soft without evidence of a hematoma. Distal pulses in the dorsalis pedis, and posterior regions remained dopplerable bilaterally. The patient was then transferred to the neuro ICU for further post stroke management. IMPRESSION: . 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 Approximately 90% stenosis of the dominant left vertebrobasilar junction distal to the origin of the left posterior-inferior cerebellar artery, and of approximately 70% of the proximal basilar artery. 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 PLAN: Follow-up in the clinic 1 month post discharge Electronically Signed   By: Julieanne Cotton M.D.   On: 08/28/2018 16:08   Ct Cerebral Perfusion W Contrast  Result Date: 08/27/2018 CLINICAL DATA:  Right-sided weakness onset this morning. EXAM: CT PERFUSION BRAIN TECHNIQUE: Multiphase CT imaging of the brain was performed following IV bolus contrast injection. Subsequent parametric perfusion maps were calculated using RAPID software. CONTRAST:  40mL ISOVUE-370  IOPAMIDOL (ISOVUE-370) INJECTION 76% COMPARISON:  CT a head today FINDINGS: CT Brain Perfusion Findings: CBF (<30%) Volume: 0mL Perfusion (Tmax>6.0s) volume: Mismatch Volume: ASPECTS on noncontrast CT Head today, not calculated due to extensive chronic ischemic change. No definite acute infarct on CT. Infarct Core: 0 mL Infarction Location:Delayed perfusion throughout the left MCA territory with apparent sparing of the basal ganglia. There is occlusion of the left internal carotid artery on CTA, age indeterminate. Left middle cerebral artery is diseased and may have delayed perfusion due to collateral circulation and stenosis. IMPRESSION: 155 mL of delayed perfusion in the left MCA territory. No core infarction. Delayed perfusion left MCA territory may be due to severe intracranial atherosclerotic disease as well as occlusion of the left internal carotid artery. Age of the internal carotid artery occlusion is indeterminate. It is possible this delayed perfusion is a chronic or possibly acute finding. Electronically Signed   By: Marlan Palau M.D.   On: 08/27/2018 16:12   Dg Chest Port 1 View  Result Date: 09/03/2018 CLINICAL DATA:  Shortness of breath. EXAM: PORTABLE CHEST 1 VIEW COMPARISON:  Radiograph September 02, 2018. FINDINGS: Stable cardiomegaly. Atherosclerosis of thoracic aorta is noted. No pneumothorax is noted. Right internal  jugular catheter is unchanged in position. Stable bilateral pulmonary edema is noted with minimal pleural effusions. Bony thorax is unremarkable. IMPRESSION: Stable cardiomegaly with bilateral pulmonary edema and minimal pleural effusions. Aortic Atherosclerosis (ICD10-I70.0). Electronically Signed   By: Lupita Raider, M.D.   On: 09/03/2018 12:33   Dg Chest Port 1 View  Result Date: 09/02/2018 CLINICAL DATA:  Acute respiratory distress. EXAM: PORTABLE CHEST 1 VIEW COMPARISON:  One-view chest x-ray 09/01/2018 FINDINGS: The heart is enlarged. Patient has been  extubated. NG tube was removed. A right IJ line remains. Atherosclerotic changes are present at the aortic arch. Diffuse interstitial edema is stable. Bibasilar airspace disease is worse on the left. IMPRESSION: 1. Interval extubation and removal of NG tube. 2. Stable cardiomegaly and edema consistent with congestive heart failure. 3. Aortic atherosclerosis. Electronically Signed   By: Marin Roberts M.D.   On: 09/02/2018 07:29   Dg Chest Port 1 View  Result Date: 09/01/2018 CLINICAL DATA:  Respiratory failure. EXAM: PORTABLE CHEST 1 VIEW COMPARISON:  08/31/2018 FINDINGS: Endotracheal tube terminates 4.2 cm above the carina. Right jugular catheter terminates over the mid SVC. Enteric tube courses into the left upper abdomen. The cardiac silhouette remains enlarged. Patchy airspace opacities throughout both lungs have not significantly changed. IMPRESSION: Bilateral airspace disease without significant interval change. Electronically Signed   By: Sebastian Ache M.D.   On: 09/01/2018 06:54   Dg Chest Port 1 View  Result Date: 08/31/2018 CLINICAL DATA:  CXR for Respiratory failure. Hx of HTN, stroke, AAA, Diabetes, and Cardiomyopathy. EXAM: PORTABLE CHEST 1 VIEW COMPARISON:  08/30/2018 FINDINGS: Endotracheal tube is in place, tip 3.8 centimeters above the carina. Nasogastric tube is in place, tip beyond the gastroesophageal junction and off the image. A RIGHT IJ central line tip overlies the superior vena cava. Patient is rotated towards the LEFT. The heart is enlarged. There is persistent airspace filling opacity throughout the RIGHT lung, slightly improved. There is significant opacity in the LEFT lung base, consistent with atelectasis or consolidation and increased since 08/27/2018. IMPRESSION: 1. Slight improvement in aeration of the RIGHT lung. 2. Increased opacity in the LEFT lung base. Electronically Signed   By: Norva Pavlov M.D.   On: 08/31/2018 09:52   Dg Chest Port 1 View  Result Date:  08/30/2018 CLINICAL DATA:  Respiratory failure. Cardiomyopathy. EXAM: PORTABLE CHEST 1 VIEW COMPARISON:  08/29/2018 and 08/27/2018 FINDINGS: Endotracheal tube and NG tube and central line appear unchanged and in good position. There has been progression of the bilateral diffuse pulmonary infiltrates, particularly in the right upper lung zone of the left lung base. Heart size and pulmonary vascularity are normal. No discrete effusions. Aortic atherosclerosis. No acute bone abnormality. IMPRESSION: Progressive bilateral pulmonary infiltrates. Aortic Atherosclerosis (ICD10-I70.0). Electronically Signed   By: Francene Boyers M.D.   On: 08/30/2018 08:48   Dg Chest Port 1 View  Result Date: 08/29/2018 CLINICAL DATA:  Central line placement EXAM: PORTABLE CHEST 1 VIEW COMPARISON:  08/29/2018 FINDINGS: Right central line is been placed with the tip in the SVC. No pneumothorax. Endotracheal tube and NG tube are unchanged. Cardiomegaly with vascular congestion and mild pulmonary edema, stable. IMPRESSION: Right central line tip in the SVC. No pneumothorax. Continued mild CHF. Electronically Signed   By: Charlett Nose M.D.   On: 08/29/2018 11:58   Dg Chest Port 1 View  Result Date: 08/29/2018 CLINICAL DATA:  PT presented to [facility] ED on 08/27/18 with complaints of right lower extremity weakness, dyspnea and chest pressure.  Hx of stroke, Peripheral vascualar disease, cardiomyopathy, and A-fib. EXAM: PORTABLE CHEST 1 VIEW COMPARISON:  08/27/2018 FINDINGS: Mild enlargement of the cardiac silhouette, stable. No mediastinal or hilar masses. There is central vascular congestion. Mild hazy opacity noted in the perihilar regions and medial lung bases bilaterally. No convincing pleural effusion and no pneumothorax. Endotracheal tube tip projects 3.8 cm above the Carina. Nasogastric tube passes below the diaphragm well into the stomach. IMPRESSION: 1. Findings are consistent with mild congestive heart failure with evidence of  slight improvement when compared to 08/27/2018. No new abnormalities. 2. Support apparatus is well positioned. Electronically Signed   By: Amie Portland M.D.   On: 08/29/2018 07:26   Dg Chest Port 1 View  Result Date: 08/28/2018 CLINICAL DATA:  Shortness of Breath EXAM: PORTABLE CHEST 1 VIEW COMPARISON:  08/27/2018 FINDINGS: Cardiac shadow is enlarged in size. Aortic calcifications are noted. Endotracheal tube is noted at the level of the carina. This should be withdrawn 2-3 cm. Nasogastric catheter is noted within the stomach. Lungs are well aerated bilaterally with mild interstitial edema similar to that seen on the prior exam. IMPRESSION: Interval intubation with the endotracheal tube at the level of the carina. This should be withdrawn 2-3 cm. Stable edema bilaterally. These results will be called to the ordering clinician or representative by the Radiologist Assistant, and communication documented in the PACS or zVision Dashboard. Electronically Signed   By: Alcide Clever M.D.   On: 08/28/2018 00:19   Dg Abd Portable 1v  Result Date: 08/28/2018 CLINICAL DATA:  Orogastric tube placement. EXAM: PORTABLE ABDOMEN - 1 VIEW COMPARISON:  None. FINDINGS: The patient's enteric tube is seen ending overlying the body of the stomach, with the side port about the fundus of the stomach. The visualized bowel gas pattern is grossly unremarkable. A small left pleural effusion is noted. Increased interstitial markings raise concern for pulmonary edema. Vascular congestion is noted. The patient's endotracheal tube is seen ending 2-3 cm above the carina. No acute osseous abnormalities are identified. IMPRESSION: 1. Enteric tube seen ending overlying the body of the stomach, with the side port about the fundus of the stomach. 2. Small left pleural effusion noted. Increased interstitial markings raise concern for pulmonary edema. Electronically Signed   By: Roanna Raider M.D.   On: 08/28/2018 22:08   Ir Percutaneous Art  Thrombectomy/infusion Intracranial Inc Diag Angio  Result Date: 08/31/2018 CLINICAL DATA:  New onset right-sided weakness with mild word-finding difficulties. 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 EXAM: IR ANGIO INTRA EXTRACRAN SEL COM CAROTID INNOMINATE UNI RIGHT MOD SED; IR PERCUTANEOUS ART THORMBECTOMY/INFUSION INTRACRANIAL INCLUDE DIAG ANGIO; IR ANGIO VERTEBRAL SEL SUBCLAVIAN INNOMINATE UNI LEFT MOD SED; IR CT HEAD LIMITED COMPARISON:  CT angiogram of the head and neck of 08/27/2018 MEDICATIONS: Heparin 3,000 units IV; . Ancef 2 g IV antibiotic was administered within 1 hour of the procedure ANESTHESIA/SEDATION: General anesthesia CONTRAST:  Isovue 300 approximately 100 mL FLUOROSCOPY TIME:  Fluoroscopy Time: 60 minutes 0 seconds (4327 mGy). COMPLICATIONS: None immediate. TECHNIQUE: Informed written consent was obtained from the patient after a thorough discussion of the procedural risks, benefits and alternatives. All questions were addressed. Maximal Sterile Barrier Technique was utilized including caps, mask, sterile gowns, sterile gloves, sterile drape, hand hygiene and skin antiseptic. A timeout was performed prior to the initiation of  the procedure. The right groin was prepped and draped in the usual sterile fashion. Thereafter using modified Seldinger technique, transfemoral access into the right common femoral artery was obtained without difficulty. Over a 0.035 inch guidewire, a 5 French Pinnacle sheath was inserted. Through this, and also over 0.035 inch guidewire, a 5 [place] JB 1 catheter was advanced to the aortic arch region and selectively positioned in the right common carotid artery, the innominate artery , the left common carotid artery and the left vertebral artery. FINDINGS: The left common carotid artery tear g demonstrates  a moderate tortuosity of the proximal left common carotid artery The left external carotid artery proximally has a mild to moderate stenosis. Is branches are otherwise normally opacified The left internal carotid artery at the bulb would demonstrates the complete angiographic occlusion with no evidence of a string sign on the delayed arterial images. The innominate artery arteriogram demonstrates patency of the right subclavian artery and the right common carotid artery proximally There is no angiographic evidence of the right vertebral artery The right common carotid bifurcation demonstrates the right external carotid artery and its major branches to be widely patent The right internal carotid artery just distal to the bulb has a severe 90% plus stenosis. Distal to this there is a U-shaped configuration of the right internal carotid artery. More distally the vessel is normal caliber The petrous the cavernous segments and supraclinoid segments are widely patent There is mild atherosclerotic irregularity involving the proximal cavernous, and distal cavernous portions of the right internal carotid artery. Right posterior communicating artery is seen opacifying the right posterior cerebral artery and the distal basilar artery. The right middle cerebral artery has approximately 30 40% stenosis of the right M1 segment The trifurcation branches appear to be widely patent into the capillary and venous phases The right anterior cerebral artery A1 segment and distally demonstrate wide patency is into the capillary and venous phases There is simultaneous cross-filling via the anterior communicating artery of the left anterior cerebral artery A2 segment and distally. Also demonstrated is opacification of the left anterior cerebral A1 segment, with flow noted into the left middle cerebral artery distribution. Unopacified blood is seen in the left middle cerebral artery from the collateral circulation. The left subclavian  arteriogram demonstrates mild stenosis at the origin of the right subclavian artery Mild atherosclerotic disease is noted of the subclavian artery and in the region of the thyrocervical trunk The dominant left vertebral artery origin is widely patent The vessel demonstrates mild tortuosity just distal to this. More distally the vessel is seen to opacify to the cranial skull base There is severe focal of focal stenosis of the left vertebrobasilar junction just distal to the origin of the left posterior-inferior cerebellar artery Additionally there is a approximately 70% stenosis of the proximal basilar artery More distally the basilar artery demonstrates mild caliber irregularity. There is a opacification of the left posterior cerebral artery the superior cerebellar arteries left greater than right, and the anterior-inferior cerebellar arteries Retrograde opacification of the right vertebrobasilar junction to the level of the right posterior-inferior cerebellar arteries is noted. Caliber irregularity of the right posterior-inferior cerebellar artery suggests intracranial arteriosclerosis. Left internal carotid artery occlusion or revascularization attempt The diagnostic JB 1 catheter left common carotid artery was then exchanged over a 0.035 inch 300 cm a 7 French Pinnacle sheath in the right groin. A diagnostic JB 1 catheter were then advanced over the rise exchange guidewire to the descending thoracic aorta. The 7 [place]  Pinnacle sheath was then exchanged over a 0.035 inch 100 cm Amplatz stiff guidewire for a a 8 French 55 cm Brite tip neurovascular sheath using biplane roadmap technique and constant fluoroscopic guidance. Good good aspiration was obtained from the side port of the neurovascular sheath. This was then connected to continuous heparinized saline infusion. The embolus guidewire was then removed. Through the 8 French neurovascular sheath, over a 0.035 inch roadrunner guidewire, a 5 5 [place] JB 1  catheter was advanced to the recover region and select position in the left common carotid artery. Using biplane technique and constant fluoroscopic guidance, over the of the 5 inch roadrunner guidewire, the 5 [place] JB 1 catheter was advanced to the left external carotid artery and exchanged over a 0.035 inch 300 cm rise exchange guidewire for an 8 French 85 cm flow gap balloon guide catheter which had been prepped with 50% contrast and 50% heparinized saline infusion The guidewire was removed. Good aspiration obtained from the hub of the 8 [place] Flo guide catheter. Gentle contrast injection demonstrated no evidence of spasms dissections or of intraluminal filling defects. At this time, in a coaxial manner and with constant heparinized saline infusion, a tree approached 1 microcatheter was advanced over a point 0.014 inch soft tip synchro micro guidewire to the distal end of the Flo guide catheter The Flood guide catheter was advanced to just inside the left internal carotid artery bulb. Thereafter, multiple attempts were made to advance the microcatheter over the micro guidewire first with Ace synchro soft and then a regular synchro guidewire. In 016 headliner double angled micro guidewire was also utilized in order to access the occluded left internal carotid artery. It was noted that there was a severe tortuosity at the junction of the proximal and the middle 1/3 of the left internal carotid artery Following multiple attempts it was decided to stop. A control was then performed through the [facility] guide catheter in the left common carotid artery which revealed brisk cross-filling of the left internal carotid artery distal cavernous and supraclinoid segments from the left external carotid artery branches via the ipsilateral ophthalmic artery Flow is noted into the left middle cerebral artery as well Also noted was a retrograde opacification from the left posterior communicating artery into the supraclinoid left ICA  and then middle cerebral artery An earlier diagnostic catheter arteriogram of the right common carotid artery he had revealed partial cross-filling via the anterior communicating artery from the right internal carotid artery Given the the collateral circulation, it was decided to stop the procedure. Throughout the procedure, the patient's blood pressure and neurological status remained stable No evidence of hemodynamic instability was noted No gross mass-effect or move filling defects or extravasation was seen Prior to the attempted revascularization of the left internal carotid artery occlusion, the patient was given 180 mg of Brilinta, and 81 mg of aspirin orally the an orogastric tube. Also the patient was loaded with 3000 units IV heparin in order to facilitate revascularization. The the 8 [place] flu guide catheter was then retrieved and removed. The 8 [place] Brite tip neurovascular sheath was then exchanged over a J-tip guidewire for a in 8 French 35 cm Brite tip neurovascular sheath. This was then connected to continuous heparinized saline infusion An arteriogram performed through this 3 5 cm 8 French Brite tip neurovascular sheath in the abdominal aorta reveal excellent flow through the common iliacs, the external and internal iliacs, and also the visualized portions of the common femoral arteries below the  level of the inguinal ligaments. At the end of procedure, a CT of the brain revealed no evidence of gross intracranial hemorrhage,, or mass effect or midline shift. The patient was left intubated on account of his wound at the time of his intubation The right groin appeared soft without evidence of a hematoma. Distal pulses in the dorsalis pedis, and posterior regions remained dopplerable bilaterally. The patient was then transferred to the neuro ICU for further post stroke management. IMPRESSION: . 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 Severe  high-grade 90% plus stenosis of the right internal carotid artery just distal to the bulb. Approximately 90% stenosis of the dominant left vertebrobasilar junction distal to the origin of the left posterior-inferior cerebellar artery, and of approximately 70% of the proximal basilar artery. 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 PLAN: Follow-up in the clinic 1 month post discharge Electronically Signed   By: Julieanne Cotton M.D.   On: 08/28/2018 16:08   Ct Head Code Stroke Wo Contrast`  Result Date: 08/27/2018 CLINICAL DATA:  Code stroke. Slurred speech beginning 3 weeks ago. Right-sided weakness beginning today. EXAM: CT HEAD WITHOUT CONTRAST TECHNIQUE: Contiguous axial images were obtained from the base of the skull through the vertex without intravenous contrast. COMPARISON:  CT 03/07/2013 FINDINGS: Brain: The brainstem and cerebellum appear normal by CT. Cerebral hemispheres show age related volume loss. There are old cortical and subcortical infarctions in the right frontal lobe, left parietal vertex, left frontal lobe, and left frontoparietal vertex. The show atrophy and encephalomalacia with gliosis. There is no finding of acute or subacute infarction on this CT. There is old lacunar infarction in the left caudate. Vascular: There is atherosclerotic calcification of the major vessels at the base of the brain. No evidence of acute hyperdense vessel. Skull: Negative Sinuses/Orbits: Clear/normal Other: None ASPECTS ([place] Stroke Program Early CT Score) Aspects is difficult in this patient with old infarctions. I do not suspect an acute insult. IMPRESSION: 1. No acute finding by CT. 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 2. 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 No acute finding is suspected by CT. 3. These results were called by telephone at the time of  interpretation on 08/27/2018 at 11:47 am to Dr. Daryel November , who verbally acknowledged these results. Electronically Signed   By: Paulina Fusi M.D.   On: 08/27/2018 11:49   Ir Angio Intra Extracran Sel Com Carotid Innominate Uni R Mod Sed  Result Date: 08/31/2018 CLINICAL DATA:  New onset right-sided weakness with mild word-finding difficulties. 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 EXAM: IR ANGIO INTRA EXTRACRAN SEL COM CAROTID INNOMINATE UNI RIGHT MOD SED; IR PERCUTANEOUS ART THORMBECTOMY/INFUSION INTRACRANIAL INCLUDE DIAG ANGIO; IR ANGIO VERTEBRAL SEL SUBCLAVIAN INNOMINATE UNI LEFT MOD SED; IR CT HEAD LIMITED COMPARISON:  CT angiogram of the head and neck of 08/27/2018 MEDICATIONS: Heparin 3,000 units IV; . Ancef 2 g IV antibiotic was administered within 1 hour of the procedure ANESTHESIA/SEDATION: General anesthesia CONTRAST:  Isovue 300 approximately 100 mL FLUOROSCOPY TIME:  Fluoroscopy Time: 60 minutes 0 seconds (4327 mGy). COMPLICATIONS: None immediate. TECHNIQUE: Informed written consent was obtained from the patient after a thorough discussion of the procedural risks, benefits and alternatives. All questions were addressed. Maximal Sterile  Barrier Technique was utilized including caps, mask, sterile gowns, sterile gloves, sterile drape, hand hygiene and skin antiseptic. A timeout was performed prior to the initiation of the procedure. The right groin was prepped and draped in the usual sterile fashion. Thereafter using modified Seldinger technique, transfemoral access into the right common femoral artery was obtained without difficulty. Over a 0.035 inch guidewire, a 5 French Pinnacle sheath was inserted. Through this, and also over 0.035 inch guidewire, a 5 [place] JB 1 catheter was advanced to the aortic arch region and  selectively positioned in the right common carotid artery, the innominate artery , the left common carotid artery and the left vertebral artery. FINDINGS: The left common carotid artery tear g demonstrates a moderate tortuosity of the proximal left common carotid artery The left external carotid artery proximally has a mild to moderate stenosis. Is branches are otherwise normally opacified The left internal carotid artery at the bulb would demonstrates the complete angiographic occlusion with no evidence of a string sign on the delayed arterial images. The innominate artery arteriogram demonstrates patency of the right subclavian artery and the right common carotid artery proximally There is no angiographic evidence of the right vertebral artery The right common carotid bifurcation demonstrates the right external carotid artery and its major branches to be widely patent The right internal carotid artery just distal to the bulb has a severe 90% plus stenosis. Distal to this there is a U-shaped configuration of the right internal carotid artery. More distally the vessel is normal caliber The petrous the cavernous segments and supraclinoid segments are widely patent There is mild atherosclerotic irregularity involving the proximal cavernous, and distal cavernous portions of the right internal carotid artery. Right posterior communicating artery is seen opacifying the right posterior cerebral artery and the distal basilar artery. The right middle cerebral artery has approximately 30 40% stenosis of the right M1 segment The trifurcation branches appear to be widely patent into the capillary and venous phases The right anterior cerebral artery A1 segment and distally demonstrate wide patency is into the capillary and venous phases There is simultaneous cross-filling via the anterior communicating artery of the left anterior cerebral artery A2 segment and distally. Also demonstrated is opacification of the left anterior  cerebral A1 segment, with flow noted into the left middle cerebral artery distribution. Unopacified blood is seen in the left middle cerebral artery from the collateral circulation. 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 More distally the vessel is seen to opacify to the cranial skull base There is severe focal of focal stenosis of the left vertebrobasilar junction just distal to the origin of the left posterior-inferior cerebellar artery Additionally there is a approximately 70% stenosis of the proximal basilar artery More distally the basilar artery demonstrates mild caliber irregularity. There is a opacification of the left posterior cerebral artery the superior cerebellar arteries left greater than right, and the anterior-inferior cerebellar arteries Retrograde opacification of the right vertebrobasilar junction to the level of the right posterior-inferior cerebellar arteries is noted. Caliber irregularity of the right posterior-inferior cerebellar artery suggests intracranial arteriosclerosis. Left internal carotid artery occlusion or revascularization attempt The diagnostic JB 1 catheter left common carotid artery was then exchanged over a 0.035 inch 300 cm a 7 [place]  Pinnacle sheath in the right groin. A diagnostic JB 1 catheter were then advanced over the rise exchange guidewire to the descending thoracic aorta. The 7 French Pinnacle sheath was then exchanged over a 0.035 inch 100 cm Amplatz stiff guidewire for a a 8 French 55 cm Brite tip neurovascular sheath using biplane roadmap technique and constant fluoroscopic guidance. Good good aspiration was obtained from the side port of the neurovascular sheath. This was then  connected to continuous heparinized saline infusion. The embolus guidewire was then removed. Through the 8 French neurovascular sheath, over a 0.035 inch roadrunner guidewire, a 5 5 [place] JB 1 catheter was advanced to the recover region and select position in the left common carotid artery. Using biplane technique and constant fluoroscopic guidance, over the of the 5 inch roadrunner guidewire, the 5 [place] JB 1 catheter was advanced to the left external carotid artery and exchanged over a 0.035 inch 300 cm rise exchange guidewire for an 8 French 85 cm flow gap balloon guide catheter which had been prepped with 50% contrast and 50% heparinized saline infusion The guidewire was removed. Good aspiration obtained from the hub of the 8 [place] Flo guide catheter. Gentle contrast injection demonstrated no evidence of spasms dissections or of intraluminal filling defects. At this time, in a coaxial manner and with constant heparinized saline infusion, a tree approached 1 microcatheter was advanced over a point 0.014 inch soft tip synchro micro guidewire to the distal end of the Flo guide catheter The Flood guide catheter was advanced to just inside the left internal carotid artery bulb. Thereafter, multiple attempts were made to advance the microcatheter over the micro guidewire first with Ace synchro soft and then a regular synchro guidewire. In 016 headliner double angled micro guidewire was also utilized in order to access the occluded left internal carotid artery. It was noted that there was a severe tortuosity at the junction of the proximal and the middle 1/3 of the left internal carotid artery Following multiple attempts it was decided to stop. A control was then performed through the [facility] guide catheter in the left common carotid artery which revealed brisk cross-filling of the left internal carotid artery distal cavernous and supraclinoid segments from the left external carotid artery branches via the ipsilateral  ophthalmic artery Flow is noted into the left middle cerebral artery as well Also noted was a retrograde opacification from the left posterior communicating artery into the supraclinoid left ICA and then middle cerebral artery An earlier diagnostic catheter arteriogram of the right common carotid artery he had revealed partial cross-filling via the anterior communicating artery from the right internal carotid artery Given the the collateral circulation, it was decided to stop the procedure. Throughout the procedure, the patient's blood pressure and neurological status remained stable No evidence of hemodynamic instability was noted No gross mass-effect or move filling defects or extravasation was seen Prior to the attempted revascularization of the left internal carotid artery occlusion, the patient was given 180 mg of Brilinta, and 81 mg of aspirin orally the an orogastric tube. Also the patient was loaded with 3000 units IV heparin in order to facilitate revascularization. The the 8 [place] flu guide catheter was then retrieved and removed. The 8 [place] Brite tip neurovascular sheath was then exchanged over a J-tip guidewire for a in 8 French 35 cm Brite tip neurovascular sheath. This was then connected to continuous heparinized saline infusion An arteriogram performed through this 3 5 cm 8 French Brite tip neurovascular sheath in  the abdominal aorta reveal excellent flow through the common iliacs, the external and internal iliacs, and also the visualized portions of the common femoral arteries below the level of the inguinal ligaments. At the end of procedure, a CT of the brain revealed no evidence of gross intracranial hemorrhage,, or mass effect or midline shift. The patient was left intubated on account of his wound at the time of his intubation The right groin appeared soft without evidence of a hematoma. Distal pulses in the dorsalis pedis, and posterior regions remained dopplerable bilaterally. The patient  was then transferred to the neuro ICU for further post stroke management. IMPRESSION: . 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 Approximately 90% stenosis of the dominant left vertebrobasilar junction distal to the origin of the left posterior-inferior cerebellar artery, and of approximately 70% of the proximal basilar artery. 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 PLAN: Follow-up in the clinic 1 month post discharge Electronically Signed   By: Julieanne Cotton M.D.   On: 08/28/2018 16:08   Ir Angio Vertebral Sel Subclavian Innominate Uni L Mod Sed  Result Date: 08/31/2018 CLINICAL DATA:  New onset right-sided weakness with mild word-finding difficulties. 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 EXAM: IR ANGIO INTRA EXTRACRAN SEL COM CAROTID INNOMINATE UNI RIGHT MOD SED; IR PERCUTANEOUS ART THORMBECTOMY/INFUSION INTRACRANIAL INCLUDE DIAG ANGIO; IR ANGIO VERTEBRAL SEL SUBCLAVIAN INNOMINATE UNI LEFT MOD SED; IR CT HEAD LIMITED COMPARISON:  CT angiogram of the head and neck of 08/27/2018 MEDICATIONS: Heparin 3,000 units IV; . Ancef 2 g IV antibiotic was administered within 1 hour of the procedure ANESTHESIA/SEDATION: General anesthesia CONTRAST:  Isovue 300 approximately 100 mL FLUOROSCOPY TIME:  Fluoroscopy Time: 60 minutes 0 seconds (4327 mGy). COMPLICATIONS: None immediate. TECHNIQUE: Informed written consent was obtained from the patient after a thorough discussion of the procedural risks, benefits and alternatives. All questions were addressed. Maximal Sterile Barrier Technique was utilized including caps, mask, sterile gowns, sterile gloves, sterile drape, hand hygiene  and skin antiseptic. A timeout was performed prior to the initiation of the procedure. The right groin was prepped and draped in the usual sterile fashion. Thereafter using modified Seldinger technique, transfemoral access into the right common femoral artery was obtained without difficulty. Over a 0.035 inch guidewire, a 5 French Pinnacle sheath was inserted. Through this, and also over 0.035 inch guidewire, a 5 [place] JB 1 catheter was advanced to the aortic arch region and selectively positioned in the right common carotid artery, the innominate artery , the left common carotid artery and the left vertebral artery. FINDINGS: The left common carotid artery tear g demonstrates a moderate tortuosity of the proximal left common carotid artery The left external carotid artery proximally has a mild to moderate stenosis. Is branches are otherwise normally opacified The left internal carotid artery at the bulb would demonstrates the complete angiographic occlusion with no evidence of a string sign on the delayed arterial images. The innominate artery arteriogram demonstrates patency of the right subclavian artery and the right common carotid artery proximally There is no angiographic evidence of the right vertebral artery The right common carotid bifurcation demonstrates the right external carotid artery and its major branches to be widely patent The right internal carotid  artery just distal to the bulb has a severe 90% plus stenosis. Distal to this there is a U-shaped configuration of the right internal carotid artery. More distally the vessel is normal caliber The petrous the cavernous segments and supraclinoid segments are widely patent There is mild atherosclerotic irregularity involving the proximal cavernous, and distal cavernous portions of the right internal carotid artery. Right posterior communicating artery is seen opacifying the right posterior cerebral artery and the distal basilar artery. The right middle  cerebral artery has approximately 30 40% stenosis of the right M1 segment The trifurcation branches appear to be widely patent into the capillary and venous phases The right anterior cerebral artery A1 segment and distally demonstrate wide patency is into the capillary and venous phases There is simultaneous cross-filling via the anterior communicating artery of the left anterior cerebral artery A2 segment and distally. Also demonstrated is opacification of the left anterior cerebral A1 segment, with flow noted into the left middle cerebral artery distribution. Unopacified blood is seen in the left middle cerebral artery from the collateral circulation. 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 More distally the vessel is seen to opacify to the cranial skull base There is severe focal of focal stenosis of the left vertebrobasilar junction just distal to the origin of the left posterior-inferior cerebellar artery Additionally there is a approximately 70% stenosis of the proximal basilar artery More distally the basilar artery demonstrates mild caliber irregularity. There is a opacification of the left posterior cerebral artery the superior cerebellar arteries left greater than right, and the anterior-inferior cerebellar arteries Retrograde opacification of the right vertebrobasilar junction to the level of the right posterior-inferior cerebellar arteries is noted. Caliber irregularity of the right posterior-inferior cerebellar artery suggests intracranial arteriosclerosis. Left internal carotid artery occlusion or revascularization attempt The diagnostic JB 1 catheter left common carotid artery was then exchanged over a 0.035 inch  300 cm a 7 French Pinnacle sheath in the right groin. A diagnostic JB 1 catheter were then advanced over the rise exchange guidewire to the descending thoracic aorta. The 7 French Pinnacle sheath was then exchanged over a 0.035 inch 100 cm Amplatz stiff guidewire for a a 8 French 55 cm Brite tip neurovascular sheath using biplane roadmap technique and constant fluoroscopic guidance. Good good aspiration was obtained from the side port of the neurovascular sheath. This was then connected to continuous heparinized saline infusion. The embolus guidewire was then removed. Through the 8 French neurovascular sheath, over a 0.035 inch roadrunner guidewire, a 5 5 [place] JB 1 catheter was advanced to the recover region and select position in the left common carotid artery. Using biplane technique and constant fluoroscopic guidance, over the of the 5 inch roadrunner guidewire, the 5 [place] JB 1 catheter was advanced to the left external carotid artery and exchanged over a 0.035 inch 300 cm rise exchange guidewire for an 8 French 85 cm flow gap balloon guide catheter which had been prepped with 50% contrast and 50% heparinized saline infusion The guidewire was removed. Good aspiration obtained from the hub of the 8 [place] Flo guide catheter. Gentle contrast injection demonstrated no evidence of spasms dissections or of intraluminal filling defects. At this time, in a coaxial manner and with constant heparinized saline  infusion, a tree approached 1 microcatheter was advanced over a point 0.014 inch soft tip synchro micro guidewire to the distal end of the Flo guide catheter The Flood guide catheter was advanced to just inside the left internal carotid artery bulb. Thereafter, multiple attempts were made to advance the microcatheter over the micro guidewire first with Ace synchro soft and then a regular synchro guidewire. In 016 headliner double angled micro guidewire was also utilized in order to access the occluded left  internal carotid artery. It was noted that there was a severe tortuosity at the junction of the proximal and the middle 1/3 of the left internal carotid artery Following multiple attempts it was decided to stop. A control was then performed through the [facility] guide catheter in the left common carotid artery which revealed brisk cross-filling of the left internal carotid artery distal cavernous and supraclinoid segments from the left external carotid artery branches via the ipsilateral ophthalmic artery Flow is noted into the left middle cerebral artery as well Also noted was a retrograde opacification from the left posterior communicating artery into the supraclinoid left ICA and then middle cerebral artery An earlier diagnostic catheter arteriogram of the right common carotid artery he had revealed partial cross-filling via the anterior communicating artery from the right internal carotid artery Given the the collateral circulation, it was decided to stop the procedure. Throughout the procedure, the patient's blood pressure and neurological status remained stable No evidence of hemodynamic instability was noted No gross mass-effect or move filling defects or extravasation was seen Prior to the attempted revascularization of the left internal carotid artery occlusion, the patient was given 180 mg of Brilinta, and 81 mg of aspirin orally the an orogastric tube. Also the patient was loaded with 3000 units IV heparin in order to facilitate revascularization. The the 8 [place] flu guide catheter was then retrieved and removed. The 8 [place] Brite tip neurovascular sheath was then exchanged over a J-tip guidewire for a in 8 French 35 cm Brite tip neurovascular sheath. This was then connected to continuous heparinized saline infusion An arteriogram performed through this 3 5 cm 8 French Brite tip neurovascular sheath in the abdominal aorta reveal excellent flow through the common iliacs, the external and internal iliacs, and  also the visualized portions of the common femoral arteries below the level of the inguinal ligaments. At the end of procedure, a CT of the brain revealed no evidence of gross intracranial hemorrhage,, or mass effect or midline shift. The patient was left intubated on account of his wound at the time of his intubation The right groin appeared soft without evidence of a hematoma. Distal pulses in the dorsalis pedis, and posterior regions remained dopplerable bilaterally. The patient was then transferred to the neuro ICU for further post stroke management. IMPRESSION: . 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 Approximately 90% stenosis of the dominant left vertebrobasilar junction distal to the origin of the left posterior-inferior cerebellar artery, and of approximately 70% of the proximal basilar artery. 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 PLAN: Follow-up in the clinic 1 month post discharge Electronically Signed   By: Julieanne Cotton M.D.   On: 08/28/2018 16:08    PHYSICAL EXAM  Temp:  [99.1 F (37.3 C)-100 F (37.8 C)] 99.7 F (37.6 C) (01/01 1600) Pulse Rate:  [83-107] 102 (01/01 2000) Resp:  [17-27] 26 (01/01 2000) BP: (  114-146)/(49-104) 127/82 (01/01 2000) SpO2:  [95 %-100 %] 97 % (01/01 2000) Weight:  [136.9 kg] 136.9 kg (01/01 0500)  General - Well nourished, well developed, in mild respiratory distress.  Right UE swelling improved  Ophthalmologic - fundi not visualized due to noncooperation.  Cardiovascular - irregularly irregular heart rate and rhythm.  Neuro - awake alert, in mild respiratory distress, able to following simple commands.  PERRLA, EOMI, able to track objects on the both sides, inconsistent blinking to visual threat bilaterally.  Facial symmetric.  Tongue midline left upper extremity 4/5.   Right lower extremity and right upper extremity flaccid.  Left lower extremity proximal 2+/5 and distal 3/5.  DTR 1+, Babinski negative.  Sensation symmetrical, coordination slow but intact on the left finger-to-nose and gait not tested.   ASSESSMENT/PLAN Mr.Fredrick Arbin Wootton a 78 y.o.malewith history of HTN, atrial fibrillation on Pradaxa last dose last evening, urethral stricture, cardiomyopathy, peripheral vascular disease, hypertension, hyperlipidemia, stroke with some balance issues, bilateral lymphedema vitamin D deficiencypresenting with right lower extremity numbness and weaknessand suspected new Lt ICA occlusion.Hedid not receive IV t-PA due to late presentation.  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  MRI head -left MCA and ACA territory scattered small infarcts  CTA H&N - left ICA, right VA occlusion.  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  2D Echo - EF 60 to 65%  LDL- 81  HgbA1c- 7.1  VTE prophylaxis -SCDs  aspirin 81 mg daily and Pradaxa (dabigatran) twice a dayprior to admission, transition from heparin IV to Eliquis 5 mg twice daily  Ongoing aggressive stroke risk factor management  Therapy recommendations: pending  Disposition: Pending  Aspiration pneumonitis  Fever Tmax 101.2->101.3-> afebrile->101.3-> afebrile  Leukocytosis WBC 22.2-15.5-11.4-13.9->16.6-> 19.2  CXR CHF  On lasix 60mg  bid, continue IV diuresis  Sputum culture normal flora  Off Zosyn today  UA negative  CCM on board  Respiratory  distress  Was intubated on ventilation  Self extubated on 09/01/18  Off sedation  So far on nasal cannula, tolerating  CCM on board  Atrial fibrillation, chronic  On Pradaxa PTA, compliant with medication  heparin IV transition to Eliquis today  On amiodarone IV, transition to p.o. tomorrow  Rate controlled  Hypotension History of hypertension  BP stable within the goal  Off neo  Avoid hypotension due to 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 as well as basal artery stenosis  BP goal 120-160  CHF  CXR showed CHF  Fluid overload - on lasix 60 Q12  Anasarca much improved  Continue IV diuresis per cardiology  Hyperlipidemia  Lipid lowering medication BZJ:IRCV  LDL81, goal < 70  Current lipid lowering medication:none (statin intolerant - abnormal labs)  Diabetes  HgbA1c7.1, goal < 7.0  Uncontrolled  Hyperglycemia improved  SSI  CBG monitoring  On Levemir, continue   Other Stroke Risk Factors  Advanced age  Former cigarette smoker - quit  Obesity,Body mass index is 47.79 kg/m., recommend weight loss, diet and exercise as appropriate   Hx stroke/TIA  PVD  Other Active Problems  Urethral stricture -  on Foley  Diffuse anasarca - much improved  Hospital day # 7  This patient is critically ill due to left MCA and ACA infarct, bilateral ICA occlusions/stenosis, A. fib, respiratory failure, aspiration pneumonia, fever and at significant risk of neurological worsening, death form recurrent stroke, hemorrhagic conversion, seizure, heart failure, septic shock, sepsis. This patient's care requires constant monitoring of vital signs, hemodynamics, respiratory and cardiac monitoring, review of multiple databases, neurological assessment, discussion with family, other specialists and medical decision making of high complexity. I spent 35 minutes of neurocritical care time in the care of this patient. I had long discussion with wife  and pt at bedside, updated pt current condition, treatment plan and potential prognosis. They expressed understanding and appreciation.      Marvel Plan, MD PhD Stroke Neurology 09/03/2018 8:44 PM     To contact Stroke Continuity provider, please refer to [email or web address]. After hours, contact General Neurology

## 2018-09-04 ENCOUNTER — Encounter (HOSPITAL_COMMUNITY): Payer: Medicare HMO

## 2018-09-04 ENCOUNTER — Inpatient Hospital Stay (HOSPITAL_COMMUNITY): Payer: Medicare HMO

## 2018-09-04 DIAGNOSIS — I509 Heart failure, unspecified: Secondary | ICD-10-CM

## 2018-09-04 DIAGNOSIS — I6522 Occlusion and stenosis of left carotid artery: Secondary | ICD-10-CM

## 2018-09-04 DIAGNOSIS — R609 Edema, unspecified: Secondary | ICD-10-CM

## 2018-09-04 DIAGNOSIS — I724 Aneurysm of artery of lower extremity: Secondary | ICD-10-CM

## 2018-09-04 DIAGNOSIS — S8011XA Contusion of right lower leg, initial encounter: Secondary | ICD-10-CM

## 2018-09-04 DIAGNOSIS — I6521 Occlusion and stenosis of right carotid artery: Secondary | ICD-10-CM

## 2018-09-04 LAB — CBC
HEMATOCRIT: 30.2 % — AB (ref 39.0–52.0)
Hemoglobin: 9.3 g/dL — ABNORMAL LOW (ref 13.0–17.0)
MCH: 31.1 pg (ref 26.0–34.0)
MCHC: 30.8 g/dL (ref 30.0–36.0)
MCV: 101 fL — ABNORMAL HIGH (ref 80.0–100.0)
Platelets: 377 10*3/uL (ref 150–400)
RBC: 2.99 MIL/uL — AB (ref 4.22–5.81)
RDW: 15.1 % (ref 11.5–15.5)
WBC: 14.7 10*3/uL — ABNORMAL HIGH (ref 4.0–10.5)
nRBC: 1.7 % — ABNORMAL HIGH (ref 0.0–0.2)

## 2018-09-04 LAB — BASIC METABOLIC PANEL
Anion gap: 11 (ref 5–15)
BUN: 14 mg/dL (ref 8–23)
CO2: 31 mmol/L (ref 22–32)
Calcium: 8 mg/dL — ABNORMAL LOW (ref 8.9–10.3)
Chloride: 103 mmol/L (ref 98–111)
Creatinine, Ser: 1.07 mg/dL (ref 0.61–1.24)
GFR calc Af Amer: >60 mL/min (ref >60)
GFR calc non Af Amer: >60 mL/min (ref >60)
Glucose, Bld: 144 mg/dL — ABNORMAL HIGH (ref 70–99)
POTASSIUM: 2.8 mmol/L — AB (ref 3.5–5.1)
Sodium: 145 mmol/L (ref 135–145)

## 2018-09-04 LAB — GLUCOSE, CAPILLARY
Glucose-Capillary: 154 mg/dL — ABNORMAL HIGH (ref 70–99)
Glucose-Capillary: 147 mg/dL — ABNORMAL HIGH (ref 70–99)
Glucose-Capillary: 163 mg/dL — ABNORMAL HIGH (ref 70–99)

## 2018-09-04 MED ORDER — HALOPERIDOL LACTATE 5 MG/ML IJ SOLN
1.0000 mg | Freq: Four times a day (QID) | INTRAMUSCULAR | Status: DC | PRN
  Administered 2018-09-07 (×2): 1 mg via INTRAVENOUS
  Filled 2018-09-04 (×2): qty 1

## 2018-09-04 MED ORDER — FUROSEMIDE 10 MG/ML IJ SOLN
INTRAMUSCULAR | Status: AC
  Filled 2018-09-04: qty 4

## 2018-09-04 MED ORDER — METOPROLOL TARTRATE 5 MG/5ML IV SOLN
INTRAVENOUS | Status: AC
  Administered 2018-09-04: 2.5 mg via INTRAVENOUS
  Filled 2018-09-04: qty 5

## 2018-09-04 MED ORDER — METOPROLOL TARTRATE 5 MG/5ML IV SOLN
2.5000 mg | Freq: Once | INTRAVENOUS | Status: AC
  Administered 2018-09-04: 2.5 mg via INTRAVENOUS

## 2018-09-04 MED ORDER — FUROSEMIDE 10 MG/ML IJ SOLN
60.0000 mg | Freq: Two times a day (BID) | INTRAMUSCULAR | Status: AC
  Administered 2018-09-04 (×2): 60 mg via INTRAVENOUS
  Filled 2018-09-04 (×2): qty 6

## 2018-09-04 MED ORDER — METOPROLOL TARTRATE 5 MG/5ML IV SOLN
5.0000 mg | INTRAVENOUS | Status: DC | PRN
  Administered 2018-09-07 (×2): 5 mg via INTRAVENOUS
  Filled 2018-09-04 (×2): qty 5

## 2018-09-04 MED ORDER — METOPROLOL TARTRATE 50 MG PO TABS
50.0000 mg | ORAL_TABLET | Freq: Two times a day (BID) | ORAL | Status: DC
  Administered 2018-09-04 – 2018-09-08 (×7): 50 mg via ORAL
  Filled 2018-09-04 (×7): qty 1

## 2018-09-04 MED ORDER — INSULIN ASPART 100 UNIT/ML ~~LOC~~ SOLN
0.0000 [IU] | Freq: Three times a day (TID) | SUBCUTANEOUS | Status: DC
  Administered 2018-09-04 – 2018-09-05 (×4): 3 [IU] via SUBCUTANEOUS
  Administered 2018-09-06: 4 [IU] via SUBCUTANEOUS
  Administered 2018-09-06: 3 [IU] via SUBCUTANEOUS
  Administered 2018-09-06: 4 [IU] via SUBCUTANEOUS
  Administered 2018-09-07 (×2): 3 [IU] via SUBCUTANEOUS
  Administered 2018-09-07: 4 [IU] via SUBCUTANEOUS
  Administered 2018-09-08 (×2): 3 [IU] via SUBCUTANEOUS
  Administered 2018-09-09: 4 [IU] via SUBCUTANEOUS
  Administered 2018-09-09 (×2): 3 [IU] via SUBCUTANEOUS
  Administered 2018-09-10 (×2): 4 [IU] via SUBCUTANEOUS
  Administered 2018-09-10: 3 [IU] via SUBCUTANEOUS
  Administered 2018-09-11: 4 [IU] via SUBCUTANEOUS
  Administered 2018-09-11 – 2018-09-12 (×3): 3 [IU] via SUBCUTANEOUS
  Administered 2018-09-12: 4 [IU] via SUBCUTANEOUS
  Administered 2018-09-13: 3 [IU] via SUBCUTANEOUS
  Administered 2018-09-13 – 2018-09-14 (×3): 4 [IU] via SUBCUTANEOUS
  Administered 2018-09-14 – 2018-09-15 (×2): 3 [IU] via SUBCUTANEOUS
  Administered 2018-09-15: 4 [IU] via SUBCUTANEOUS
  Administered 2018-09-15 – 2018-09-17 (×4): 3 [IU] via SUBCUTANEOUS

## 2018-09-04 MED ORDER — EZETIMIBE 10 MG PO TABS
10.0000 mg | ORAL_TABLET | Freq: Every day | ORAL | Status: DC
  Administered 2018-09-04 – 2018-09-16 (×13): 10 mg via ORAL
  Filled 2018-09-04 (×13): qty 1

## 2018-09-04 MED ORDER — POTASSIUM CHLORIDE CRYS ER 20 MEQ PO TBCR
40.0000 meq | EXTENDED_RELEASE_TABLET | ORAL | Status: AC
  Administered 2018-09-04 (×2): 40 meq via ORAL
  Filled 2018-09-04 (×3): qty 2

## 2018-09-04 MED ORDER — HALOPERIDOL LACTATE 5 MG/ML IJ SOLN
INTRAMUSCULAR | Status: AC
  Filled 2018-09-04: qty 1

## 2018-09-04 NOTE — [place] FL2 (Addendum)
[place] MEDICAID FL2 LEVEL OF CARE SCREENING TOOL     IDENTIFICATION  Patient Name: Darren Frazier Birthdate: [birth year] Sex: male Admission Date (Current Location): 08/27/2018  [facility] and [place] Number:  Producer, television/film/video and Address:  [facility] [place], [address] [place], [place] [address]      Provider Number: [id number]  Attending Physician Name and Address:  Marvel Plan, MD  Relative Name and Phone Number:       Current Level of Care: Hospital Recommended Level of Care: Skilled Nursing Facility Prior Approval Number:    Date Approved/Denied:   PASRR Number: [id number]  Discharge Plan: SNF    Current Diagnoses: Patient Active Problem List   Diagnosis Date Noted  . Acute right-sided weakness 08/27/2018  . Acute ischemic stroke (HCC) 08/27/2018  . 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 Endotracheally intubated   . Essential hypertension 02/20/2018  . Diabetes (HCC) 02/20/2018  . 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 Chronic venous insufficiency 03/31/2017  . Lymphedema 03/31/2017  . Leg pain 03/31/2017    Orientation RESPIRATION BLADDER Height & Weight     Self, Place  O2(Nasal Cannula 3L) Continent Weight: (!) 301 lb 13 oz (136.9 kg) Height:  5\' 9"  (175.3 cm)  BEHAVIORAL SYMPTOMS/MOOD NEUROLOGICAL BOWEL NUTRITION STATUS      Incontinent Diet(heart healthy/carb modified, thin liquids)  AMBULATORY STATUS COMMUNICATION OF NEEDS Skin   Extensive Assist Verbally Skin abrasions(left toe, guaze dressing, change PRN)                       Personal Care Assistance Level of Assistance  Bathing, Feeding, Dressing Bathing Assistance: Maximum assistance Feeding assistance: Limited assistance Dressing Assistance: Maximum assistance     Functional Limitations Info  Sight, Hearing, Speech Sight Info: Adequate Hearing Info: Adequate Speech Info: Adequate     SPECIAL CARE FACTORS FREQUENCY  PT (By licensed PT), OT (By licensed OT)   Contact pre: MRSA     PT Frequency: 3x OT Frequency: 3x            Contractures Contractures Info: Not present    Additional Factors Info  Code Status, Allergies Code Status Info: Full Code Allergies Info: Ace Inhibitors, Omeprazole, Statins           Current Medications (09/04/2018):  This is the current hospital active medication list Current Facility-Administered Medications  Medication Dose Route Frequency Provider Last Rate Last Dose  . 0.9 %  sodium chloride infusion   Intravenous PRN Marvel Plan, MD 10 mL/hr at 09/04/18 1315    . acetaminophen (TYLENOL) tablet 650 mg  650 mg Oral Q4H PRN Milon Dikes, MD       Or  . acetaminophen (TYLENOL) solution 650 mg  650 mg Per Tube Q4H PRN Milon Dikes, MD   650 mg at 09/01/18 0540   Or  . acetaminophen (TYLENOL) suppository 650 mg  650 mg Rectal Q4H PRN Milon Dikes, MD   650 mg at 09/01/18 1734  . amiodarone (PACERONE) tablet 200 mg  200 mg Oral Daily Ollis, Brandi L, NP   200 mg at 09/04/18 0830  . apixaban (ELIQUIS) tablet 5 mg  5 mg Oral BID Baldemar Friday, RPH   5 mg at 09/03/18 2102  . Chlorhexidine Gluconate Cloth 2 % PADS 6 each  6 each Topical Q0600 Marvel Plan, MD      . cholecalciferol (VITAMIN  D3) tablet 1,000 Units  1,000 Units Per Tube Daily Coralyn Helling, MD   Stopped at 08/31/18 [phone]  . furosemide (LASIX) injection 60 mg  60 mg Intravenous Q12H Marvel Plan, MD   60 mg at 09/04/18 1116  . insulin aspart (novoLOG) injection 0-20 Units  0-20 Units Subcutaneous Q4H Coralyn Helling, MD   4 Units at 09/04/18 1241  . insulin detemir (LEVEMIR) injection 10 Units  10 Units Subcutaneous Daily Coralyn Helling, MD   10 Units at 09/04/18 1048  . levothyroxine (SYNTHROID, LEVOTHROID) tablet 88 mcg  88 mcg Oral Q0600 Canary Brim L, NP   88 mcg at 09/04/18 0529  . metoprolol tartrate (LOPRESSOR) tablet 50 mg  50 mg Oral BID Marvel Plan, MD      .  mupirocin ointment (BACTROBAN) 2 % 1 application  1 application Nasal BID Marvel Plan, MD   1 application at 09/04/18 1102  . ondansetron (ZOFRAN) injection 4 mg  4 mg Intravenous Q6H PRN Deveshwar, Sanjeev, MD      . polyvinyl alcohol (LIQUIFILM TEARS) 1.4 % ophthalmic solution 1 drop  1 drop Both Eyes PRN Marvel Plan, MD   1 drop at 08/31/18 0850  . potassium chloride SA (K-DUR,KLOR-CON) CR tablet 40 mEq  40 mEq Oral Q4H Marvel Plan, MD      . senna-docusate (Senokot-S) tablet 1 tablet  1 tablet Oral QHS PRN Milon Dikes, MD         Discharge Medications: Please see discharge summary for a list of discharge medications.  Relevant Imaging Results:  Relevant Lab Results:   Additional Information SSN: [id number]  Maree Krabbe, LCSW

## 2018-09-04 NOTE — Progress Notes (Signed)
Progress Note  Patient Name: Darren Frazier Date of Encounter: 09/04/2018  Primary Cardiologist:   No primary care provider on file.   Subjective   Awake and answers questions.  No pain.  Dry mouth.   Inpatient Medications    Scheduled Meds: . amiodarone  200 mg Oral Daily  . apixaban  5 mg Oral BID  . Chlorhexidine Gluconate Cloth  6 each Topical Q0600  . cholecalciferol  1,000 Units Per Tube Daily  . furosemide  60 mg Intravenous Q12H  . insulin aspart  0-20 Units Subcutaneous Q4H  . insulin detemir  10 Units Subcutaneous Daily  . levothyroxine  88 mcg Oral Q0600  . metoprolol tartrate  50 mg Oral BID  . mupirocin ointment  1 application Nasal BID  . potassium chloride  40 mEq Oral Q4H   Continuous Infusions: . sodium chloride 10 mL/hr at 09/04/18 0600   PRN Meds: sodium chloride, acetaminophen **OR** acetaminophen (TYLENOL) oral liquid 160 mg/5 mL **OR** acetaminophen, ondansetron (ZOFRAN) IV, polyvinyl alcohol, senna-docusate   Vital Signs      Vitals:   09/04/18 0700 09/04/18 0800 09/04/18 0900 09/04/18 1000  BP: 132/64 128/83 138/86 133/75  Pulse: 87 78 93 100  Resp: (!) 29 (!) 21 (!) 23 (!) 28  Temp:  98.4 F (36.9 C)    TempSrc:  Oral    SpO2: 96% 98% 100% 100%  Weight:      Height:       Physical Exam   GEN: No  acute distress.   Neck: No  JVD Cardiac: Irregular RR, no murmurs, rubs, or gallops.  Respiratory: Clear  to auscultation bilaterally. GI: Soft, nontender, non-distended, normal bowel sounds  MS:  Diffuse severe edema; No deformity. Neuro:   right hemiparesis Psych: Oriented and appropriate    Intake/Output Summary (Last 24 hours) at 09/04/2018 1152 Last data filed at 09/04/2018 0800 Gross per 24 hour  Intake 1073.78 ml  Output 3050 ml  Net -1976.22 ml   Filed Weights   09/01/18 0500 09/02/18 0500 09/03/18 0500  Weight: (!) 145.7 kg 129.8 kg (!) 136.9 kg    Telemetry    Atrial fib with controlled rate.   - Personally  Reviewed  ECG    NA  - Personally Reviewed  Labs    Chemistry Recent Labs  Lab 09/03/18 0206 09/03/18 1626 09/04/18 0530  NA 146* 144 145  K 2.8* 2.8* 2.8*  CL 105 105 103  CO2 26 26 31   GLUCOSE 87 155* 144*  BUN 19 18 14   CREATININE 1.23 1.13 1.07  CALCIUM 8.6* 8.1* 8.0*  GFRNONAA 56* >60 >60  GFRAA >60 >60 >60  ANIONGAP 15 13 11      Hematology Recent Labs  Lab 09/02/18 0541 09/03/18 0206 09/04/18 0530  WBC 16.6* 19.2* 14.7*  RBC 2.88* 3.06* 2.99*  HGB 9.4* 9.9* 9.3*  HCT 29.3* 31.3* 30.2*  MCV 101.7* 102.3* 101.0*  MCH 32.6 32.4 31.1  MCHC 32.1 31.6 30.8  RDW 14.6 14.6 15.1  PLT 322 363 377    Cardiac Enzymes Recent Labs  Lab 08/29/18 0353 08/29/18 0708 08/29/18 1201 08/31/18 1027  TROPONINI 3.59* 6.39* 15.82* 12.47*   No results for input(s): TROPIPOC in the last 168 hours.   BNPNo results for input(s): BNP, PROBNP in the last 168 hours.   DDimer No results for input(s): DDIMER in the last 168 hours.   Radiology    Dg Chest Port 1 View  Result Date: 09/03/2018 CLINICAL  DATA:  Shortness of breath. EXAM: PORTABLE CHEST 1 VIEW COMPARISON:  Radiograph September 02, 2018. FINDINGS: Stable cardiomegaly. Atherosclerosis of thoracic aorta is noted. No pneumothorax is noted. Right internal jugular catheter is unchanged in position. Stable bilateral pulmonary edema is noted with minimal pleural effusions. Bony thorax is unremarkable. IMPRESSION: Stable cardiomegaly with bilateral pulmonary edema and minimal pleural effusions. Aortic Atherosclerosis (ICD10-I70.0). Electronically Signed   By: Lupita Raider, M.D.   On: 09/03/2018 12:33    Cardiac Studies   Study Conclusions  - Left ventricle: The cavity size was normal. Systolic function was   normal. The estimated ejection fraction was in the range of 60%   to 65%. Images were inadequate for LV wall motion assessment. The   study is not technically sufficient to allow evaluation of LV   diastolic  function. - Aortic valve: Valve mobility was moderately restricted.   Suboptimal image quality limited Doppler assessment of aortic   valve systolic gradient. There was no regurgitation. - Mitral valve: Moderately calcified annulus. There was trivial   regurgitation. - Left atrium: The atrium was moderately dilated. - Tricuspid valve: There was trivial regurgitation.   Patient Profile     78 y.o. 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 We are involved for management of his atrial fib.   Assessment & Plan    Atrial fib:  Passed swallowing so started Eliquis.  Currently holding until pseudoaneurysm is thrombosed.  On PO amiodarone.  On metoprolol PO.  Rate seems to be relatively controlled.  Continue current therapy.    Elevated Troponin/NSTEMI: EF was OK on echo.  Down 2.7 liters yesterday.  Continue IV diuresis.  We will follow as needed.    For questions or updates, please contact [facility] Please consult [email or web address] for contact info under Cardiology/STEMI.   Signed, Rollene Rotunda, MD  09/04/2018, 11:52 AM

## 2018-09-04 NOTE — Progress Notes (Signed)
eLink Physician-Brief Progress Note Patient Name: Darren Frazier DOB: Oct 27, 1940 MRN: [id number]   Date of Service  09/04/2018  HPI/Events of Note    eICU Interventions  Camera: for a fib RVr and confusion/agitation. Notes, labs , meds seen.  Discussed with bed side.  A/P: 1. A fib RVR on apixaban, amiodarone oral. Refusing orally. - stat Lopressor 2.5 mg with improvement in HR. 2. New acute confusion. Called Neurology.  After stat Haldol 1 mg, got better. Stable rt hemiparesis from recent CVA, Stent.  Neuro checks. If worsens, need CT head to r/o any bleed etc. On apixaban.  - asp precautions     Intervention Category Major Interventions: Arrhythmia - evaluation and management;Delirium, psychosis, severe agitation - evaluation and management Minor Interventions: Communication with other healthcare providers and/or family  Ranee Gosselin 09/04/2018, 8:29 PM

## 2018-09-04 NOTE — Progress Notes (Addendum)
STROKE TEAM PROGRESS NOTE   SUBJECTIVE (INTERVAL HISTORY) His RN and wife are at the bedside.  Found to have continued right lower abdomen/groin wound oozing blood.  Pressure holding did not stop blood oozing.  Ultrasound performed confirmed pseudoaneurysm.  Vascular surgery consulted found that pseudoaneurysm was small the current oozing blood was from old abdominal wall hematoma.  No surgery or thrombin injection needed at this time, but need to be addressed in the near future.  OBJECTIVE Temp:  [97.9 F (36.6 C)-99.7 F (37.6 C)] 97.9 F (36.6 C) (01/02 1318) Pulse Rate:  [71-102] 71 (01/02 1315) Cardiac Rhythm: Atrial fibrillation (01/02 1315) Resp:  [16-33] 32 (01/02 1315) BP: (113-146)/(58-86) 132/67 (01/02 1315) SpO2:  [95 %-100 %] 97 % (01/02 1315)  Recent Labs  Lab 09/02/18 1910 09/02/18 2310 09/03/18 0309 09/03/18 0738 09/04/18 1219  GLUCAP 105* 130* 136* 126* 154*   Recent Labs  Lab 08/28/18 1826 08/29/18 0416 08/29/18 1905  08/31/18 1027 09/01/18 0046 09/03/18 0206 09/03/18 1626 09/04/18 0530  NA  --  136  --    < > 142 143 146* 144 145  K  --  4.1  --    < > 4.1 4.4 2.8* 2.8* 2.8*  CL  --  103  --    < > 114* 114* 105 105 103  CO2  --  21*  --    < > 21* 21* 26 26 31   GLUCOSE  --  232*  --    < > 146* 162* 87 155* 144*  BUN  --  11  --    < > 22 26* 19 18 14   CREATININE  --  1.23  --    < > 1.23 1.27* 1.23 1.13 1.07  CALCIUM  --  7.9*  --    < > 7.8* 8.0* 8.6* 8.1* 8.0*  MG 1.5* 1.6* 1.7  --   --   --  1.8  --   --   PHOS 3.3 3.2 2.2*  --   --   --   --   --   --    < > = values in this interval not displayed.   No results for input(s): AST, ALT, ALKPHOS, BILITOT, PROT, ALBUMIN in the last 168 hours. Recent Labs  Lab 08/31/18 0517 09/01/18 0046 09/02/18 0541 09/03/18 0206 09/04/18 0530  WBC 11.4* 13.9* 16.6* 19.2* 14.7*  HGB 9.3* 9.3* 9.4* 9.9* 9.3*  HCT 29.4* 29.9* 29.3* 31.3* 30.2*  MCV 104.6* 102.4* 101.7* 102.3* 101.0*  PLT 227 269 322 363  377   Recent Labs  Lab 08/29/18 0353 08/29/18 0708 08/29/18 1201 08/31/18 1027  TROPONINI 3.59* 6.39* 15.82* 12.47*   No results for input(s): LABPROT, INR in the last 72 hours. Recent Labs    09/02/18 1135  COLORURINE STRAW*  LABSPEC 1.010  PHURINE 5.0  GLUCOSEU NEGATIVE  HGBUR SMALL*  BILIRUBINUR NEGATIVE  KETONESUR NEGATIVE  PROTEINUR NEGATIVE  NITRITE NEGATIVE  LEUKOCYTESUR NEGATIVE       Component Value Date/Time   CHOL 138 08/28/2018 0600   TRIG 171 (H) 08/28/2018 0921   HDL 24 (L) 08/28/2018 0600   CHOLHDL 5.8 08/28/2018 0600   VLDL 33 08/28/2018 0600   LDLCALC 81 08/28/2018 0600   Lab Results  Component Value Date   HGBA1C 7.1 (H) 08/28/2018   No results found for: LABOPIA, COCAINSCRNUR, LABBENZ, AMPHETMU, THCU, LABBARB  No results for input(s): ETH in the last 168 hours.  I have personally reviewed the  radiological images below and agree with the radiology interpretations.  Ct Angio Head W Or Wo Contrast  Result Date: 08/27/2018 CLINICAL DATA:  Acute presentation with speech disturbance. EXAM: CT ANGIOGRAPHY HEAD AND NECK TECHNIQUE: Multidetector CT imaging of the head and neck was performed using the standard protocol during bolus administration of intravenous contrast. Multiplanar CT image reconstructions and MIPs were obtained to evaluate the vascular anatomy. Carotid stenosis measurements (when applicable) are obtained utilizing NASCET criteria, using the distal internal carotid diameter as the denominator. CONTRAST:  75mL ISOVUE-370 IOPAMIDOL (ISOVUE-370) INJECTION 76% COMPARISON:  CT same day. FINDINGS: CTA NECK FINDINGS Aortic arch: Aortic atherosclerosis.  No aneurysm or dissection. Right carotid system: Common carotid artery shows some atherosclerotic plaque but is widely patent to the bifurcation. There is soft and calcified plaque affecting the carotid bifurcation and ICA bulb region. There is severe stenosis in the distal bulb region with luminal  diameter of 1 mm. The vessel is quite tortuous beyond that but widely patent to the upper cervical region, where there is soft and calcified plaque resulting in minimal diameter of 3.5 mm. Compared to an expected diameter of 5 mm, stenosis in the bulb is 80% or greater. Stenosis in the upper cervical region is 30%. Left carotid system: Common carotid artery shows atherosclerotic plaque but is sufficiently patent to the bifurcation region. There is calcified and soft plaque at the carotid bifurcation and ICA bulb. There is left internal carotid artery occlusion at the distal bulb. 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 The left vertebral artery shows 50% stenosis at its origin but is sufficiently patent beyond that through the cervical region to the foramen magnum. Skeleton: Mid cervical spondylosis. Other neck: No mass or lymphadenopathy. Upper chest: Interstitial prominence which could go along with fluid overload or mild edema. Review of the MIP images confirms the above findings CTA HEAD FINDINGS Anterior circulation: Left internal carotid artery is occluded without antegrade flow through the skull base. Right internal carotid artery shows atherosclerotic disease in the carotid siphon region with stenosis estimated at 50-70%. The anterior and middle cerebral vessels are patent. There is atherosclerotic irregularity of the M1 segment with stenosis of 50-70%. No acute vessel occlusion is identified. There is a chronic punctate calcified embolus in 1 of the insular branches, but this was present in 2014. On the left, there is reconstituted flow in the distal siphon. Severe stenosis of the supraclinoid ICA, 80% or greater. Flow is present in the anterior and middle cerebral vessels, secondary to reconstituted flow and flow through communicating arteries. There is 50% stenosis in the M1 segment. I do not see any occluded or missing large or medium vessels in the MCA territory.  Posterior circulation: Right vertebral artery shows no antegrade flow at the foramen magnum. There is retrograde flow in the distal right vertebral. Left vertebral artery is patent at the foramen magnum. There is atherosclerotic disease of the V4 segment with stenosis estimated at 70%. There are serial stenoses. The vessel does show flow to the basilar, which shows atherosclerotic irregularity but is patent. Flow is present in the superior cerebellar and posterior cerebral arteries. Venous sinuses: Patent and normal. Anatomic variants: None significant. Delayed phase: No abnormal enhancement. 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 Severe irregular atherosclerotic disease of the right carotid bifurcation with stenosis of 80% in the bulb region and 30% in the distal ICA. 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 50% stenosis of the left vertebral artery origin. Severe atherosclerotic disease in both carotid siphon regions. Right siphon stenosis estimated at 50-70%. Reconstituted flow in the distal left siphon and supraclinoid region. 80% supraclinoid stenosis. Flow in both anterior and middle cerebral artery territories. Stenosis of both M1 segments estimated at 50-70%. I do not identify any large or medium vessel occlusion acutely within the MCA branch vessels. Severely disease left V4 segment with serial stenoses proximal to the basilar estimated at 70%. Basilar atherosclerotic irregularity without flow limiting stenosis. Posterior circulation branch vessels do show flow, including right PICA which is supplied by retrograde flow in the distal right vertebral artery. Electronically Signed   By: Paulina Fusi M.D.   On: 08/27/2018 13:14   Dg Chest 2 View  Result Date: 08/27/2018 CLINICAL DATA:  Chest pain and shortness of breath.  Cardiomyopathy. EXAM: CHEST - 2 VIEW COMPARISON:  03/07/2013 FINDINGS: Mild cardiomegaly.  Aortic atherosclerosis. Diffuse interstitial infiltrates, consistent with pulmonary edema. Mild subsegmental atelectasis also seen in the left upper lobe. No evidence of pulmonary consolidation or significant pleural effusion. IMPRESSION: Mild cardiomegaly and diffuse interstitial edema, consistent with congestive heart failure. Electronically Signed   By: Myles Rosenthal M.D.   On: 08/27/2018 12:02   Ct Angio Neck W Or Wo Contrast  Result Date: 08/27/2018 CLINICAL DATA:  Acute presentation with speech disturbance. EXAM: CT ANGIOGRAPHY HEAD AND NECK TECHNIQUE: Multidetector CT imaging of the head and neck was performed using the standard protocol during bolus administration of intravenous contrast. Multiplanar CT image reconstructions and MIPs were obtained to evaluate the vascular anatomy. Carotid stenosis measurements (when applicable) are obtained utilizing NASCET criteria, using the distal internal carotid diameter as the denominator. CONTRAST:  75mL ISOVUE-370 IOPAMIDOL (ISOVUE-370) INJECTION 76% COMPARISON:  CT same day. FINDINGS: CTA NECK FINDINGS Aortic arch: Aortic atherosclerosis.  No aneurysm or dissection. Right carotid system: Common carotid artery shows some atherosclerotic plaque but is widely patent to the bifurcation. There is soft and calcified plaque affecting the carotid bifurcation and ICA bulb region. There is severe stenosis in the distal bulb region with luminal diameter of 1 mm. The vessel is quite tortuous beyond that but widely patent to the upper cervical region, where there is soft and calcified plaque resulting in minimal diameter of 3.5 mm. Compared to an expected diameter of 5 mm, stenosis in the bulb is 80% or greater. Stenosis in the upper cervical region is 30%. Left carotid system: Common carotid artery shows atherosclerotic plaque but is sufficiently patent to the bifurcation region. There is calcified and soft plaque at the carotid bifurcation and ICA bulb. There is left  internal carotid artery occlusion at the distal bulb. 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 The left vertebral artery shows 50% stenosis at its origin but is sufficiently patent beyond that through the cervical region to the foramen magnum. Skeleton: Mid cervical spondylosis. Other neck: No mass or lymphadenopathy. Upper chest: Interstitial prominence which could go along with fluid overload or mild edema. Review of the MIP images confirms the above findings CTA HEAD FINDINGS Anterior circulation: Left internal carotid artery is occluded without antegrade flow through the skull base. Right internal carotid artery shows atherosclerotic disease in the carotid siphon region with stenosis estimated at 50-70%. The anterior and middle cerebral vessels are patent. There is atherosclerotic irregularity of the M1 segment with stenosis of 50-70%. No acute vessel occlusion is identified. There is a chronic punctate calcified  embolus in 1 of the insular branches, but this was present in 2014. On the left, there is reconstituted flow in the distal siphon. Severe stenosis of the supraclinoid ICA, 80% or greater. Flow is present in the anterior and middle cerebral vessels, secondary to reconstituted flow and flow through communicating arteries. There is 50% stenosis in the M1 segment. I do not see any occluded or missing large or medium vessels in the MCA territory. Posterior circulation: Right vertebral artery shows no antegrade flow at the foramen magnum. There is retrograde flow in the distal right vertebral. Left vertebral artery is patent at the foramen magnum. There is atherosclerotic disease of the V4 segment with stenosis estimated at 70%. There are serial stenoses. The vessel does show flow to the basilar, which shows atherosclerotic irregularity but is patent. Flow is present in the superior cerebellar and posterior cerebral arteries. Venous sinuses: Patent and normal. Anatomic  variants: None significant. Delayed phase: No abnormal enhancement. 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 Severe irregular atherosclerotic disease of the right carotid bifurcation with stenosis of 80% in the bulb region and 30% in the distal ICA. 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 50% stenosis of the left vertebral artery origin. Severe atherosclerotic disease in both carotid siphon regions. Right siphon stenosis estimated at 50-70%. Reconstituted flow in the distal left siphon and supraclinoid region. 80% supraclinoid stenosis. Flow in both anterior and middle cerebral artery territories. Stenosis of both M1 segments estimated at 50-70%. I do not identify any large or medium vessel occlusion acutely within the MCA branch vessels. Severely disease left V4 segment with serial stenoses proximal to the basilar estimated at 70%. Basilar atherosclerotic irregularity without flow limiting stenosis. Posterior circulation branch vessels do show flow, including right PICA which is supplied by retrograde flow in the distal right vertebral artery. Electronically Signed   By: Paulina Fusi M.D.   On: 08/27/2018 13:14   Darren Brain Wo Contrast  Result Date: 08/28/2018 CLINICAL DATA:  Follow-up examination for acute stroke, right lower extremity weakness. Known left ICA occlusion. EXAM: MRI HEAD WITHOUT CONTRAST TECHNIQUE: Multiplanar, multiecho pulse sequences of the brain and surrounding structures were obtained without intravenous contrast. COMPARISON:  Prior CTs from 08/27/2018. FINDINGS: Brain: Generalized age-related cerebral atrophy. Patchy and confluent T2/FLAIR hyperintensity within the periventricular and deep white matter both cerebral hemispheres most consistent with chronic microvascular ischemic disease, moderate nature. Scatter remote cortical infarcts involving the posterior left frontoparietal region, right  parietal lobe, and right frontal lobe are seen. Small remote right cerebellar infarct. Remote lacunar infarct present at the left caudate head. Scattered chronic hemosiderin staining seen about several of these infarcts. Scattered multifocal foci of restricted diffusion involving the cortical subcortical left frontal, parietal, and temporal occipital regions, compatible with acute ischemic left MCA territory infarcts. Largest area of infarction seen at the high left frontal parietal region and measures 2.5 cm (series 3, image 48). Possible faint petechial hemorrhage about a few of these infarcts without frank hemorrhagic transformation or significant mass effect. Findings likely embolic in nature. No mass lesion, midline shift or mass effect. Mild diffuse ventricular prominence related global parenchymal volume loss without hydrocephalus. No extra-axial fluid collection. Pituitary gland within normal limits. Vascular: Abnormal flow void within the left ICA to its cavernous segment, compatible with previously identified left ICA occlusion. Major intracranial vascular flow voids otherwise maintained at the skull base. Skull and upper cervical spine: Craniocervical  junction within normal limits. Upper cervical spine normal. Bone marrow signal intensity within normal limits. No scalp soft tissue abnormality. Sinuses/Orbits: Patient status post bilateral ocular lens replacement. Globes and orbital soft tissues demonstrate no acute finding. Scattered mucosal thickening seen throughout the paranasal sinuses with superimposed scattered air-fluid levels. Fluid seen within the nasopharynx. Patient likely intubated. Trace bilateral mastoid effusions noted. Inner ear structures normal. Other: None. IMPRESSION: 1. 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 Possible faint scattered associated petechial hemorrhage without frank hemorrhagic transformation or significant mass  effect. 2. Abnormal flow void within the left ICA to its cavernous segment, consistent with known left ICA occlusion. 3. 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 4. Moderately advanced cerebral atrophy with chronic small vessel ischemic disease. Electronically Signed   By: Rise Mu M.D.   On: 08/28/2018 15:33   Ct Abdomen Pelvis W Contrast  Result Date: 08/29/2018 CLINICAL DATA:  Abdominal distention.  Bloating. EXAM: CT ABDOMEN AND PELVIS WITH CONTRAST TECHNIQUE: Multidetector CT imaging of the abdomen and pelvis was performed using the standard protocol following bolus administration of intravenous contrast. CONTRAST:  OMNIPAQUE IOHEXOL 300 MG/ML  SOLN COMPARISON:  Abdominal radiograph dated 08/28/2018 and abdominal ultrasound dated 02/28/2016 FINDINGS: Lower chest: There are bilateral consolidative infiltrates in the lower lobes as well as minimal bilateral pleural effusions. Aortic atherosclerosis. Coronary artery calcifications. Hepatobiliary: Liver parenchyma is normal. No biliary ductal dilatation. Vicarious excretion of contrast in the normal appearing gallbladder. Pancreas: Unremarkable. No pancreatic ductal dilatation or surrounding inflammatory changes. Spleen: Normal in size without focal abnormality. Adrenals/Urinary Tract: Adrenal glands are normal. Normal right kidney. Large chronic cyst in the lower pole of the left kidney with some calcification in the wall. Maximum diameter of the cyst is approximately 9 cm. Stomach/Bowel: NG tube tip in the body of the stomach. There are few diverticula in the proximal sigmoid portion of the colon. There is moderate air in the colon without significant distention. Small bowel appears normal. Fluid and air in the stomach. Appendix has been removed. Vascular/Lymphatic: There is a poorly defined hematoma in the right inguinal region in the panniculus. This  measures approximately 12 x 5 x 2 cm and is probably related to recent angiography. There are several small lymph nodes in the right inguinal region, the largest being 15 mm in diameter. Extensive aortic atherosclerosis. No adenopathy. Reproductive: Dense calcification in the normal-sized prostate gland. Other: There is a small amount of ascites in the abdomen, nonspecific. Small periumbilical hernia containing only fat. Edema in the subcutaneous fat of both flanks, right greater than left. Slight edema in the panniculus and in the lateral aspect of both thighs. Musculoskeletal: No acute abnormalities. Multilevel degenerative disc disease in the thoracic spine and lumbar spine. IMPRESSION: 1. Small hematoma in the right inguinal region in the panniculus, probably secondary to the recent angiography. 2. Bilateral consolidative infiltrates in the posterior aspect of both lower lobes with tiny adjacent pleural effusions. 3. Small amount of nonspecific ascites. 4. Slightly prominent gas in the bowel without evidence of bowel obstruction. This probably represents an ileus. Electronically Signed   By: Francene Boyers M.D.   On: 08/29/2018 17:03   Ir Ct Head Ltd  Result Date: 08/31/2018 CLINICAL DATA:  New onset right-sided weakness with mild word-finding difficulties. Abnormal CT angiogram of the head and neck revealing non flow limiting filling defect in the left middle cerebral artery M1 segment,  and also acute occlusion of the left internal carotid artery at the bulb. EXAM: IR ANGIO INTRA EXTRACRAN SEL COM CAROTID INNOMINATE UNI RIGHT MOD SED; IR PERCUTANEOUS ART THORMBECTOMY/INFUSION INTRACRANIAL INCLUDE DIAG ANGIO; IR ANGIO VERTEBRAL SEL SUBCLAVIAN INNOMINATE UNI LEFT MOD SED; IR CT HEAD LIMITED COMPARISON:  CT angiogram of the head and neck of 08/27/2018 MEDICATIONS: Heparin 3,000 units IV; . Ancef 2 g IV antibiotic was administered within 1 hour of the procedure ANESTHESIA/SEDATION: General anesthesia CONTRAST:   Isovue 300 approximately 100 mL FLUOROSCOPY TIME:  Fluoroscopy Time: 60 minutes 0 seconds (4327 mGy). COMPLICATIONS: None immediate. TECHNIQUE: Informed written consent was obtained from the patient after a thorough discussion of the procedural risks, benefits and alternatives. All questions were addressed. Maximal Sterile Barrier Technique was utilized including caps, mask, sterile gowns, sterile gloves, sterile drape, hand hygiene and skin antiseptic. A timeout was performed prior to the initiation of the procedure. The right groin was prepped and draped in the usual sterile fashion. Thereafter using modified Seldinger technique, transfemoral access into the right common femoral artery was obtained without difficulty. Over a 0.035 inch guidewire, a 5 French Pinnacle sheath was inserted. Through this, and also over 0.035 inch guidewire, a 5 [place] JB 1 catheter was advanced to the aortic arch region and selectively positioned in the right common carotid artery, the innominate artery , the left common carotid artery and the left vertebral artery. FINDINGS: The left common carotid artery tear g demonstrates a moderate tortuosity of the proximal left common carotid artery The left external carotid artery proximally has a mild to moderate stenosis. Is branches are otherwise normally opacified The left internal carotid artery at the bulb would demonstrates the complete angiographic occlusion with no evidence of a string sign on the delayed arterial images. The innominate artery arteriogram demonstrates patency of the right subclavian artery and the right common carotid artery proximally There is no angiographic evidence of the right vertebral artery The right common carotid bifurcation demonstrates the right external carotid artery and its major branches to be widely patent The right internal carotid artery just distal to the bulb has a severe 90% plus stenosis. Distal to this there is a U-shaped configuration of the  right internal carotid artery. More distally the vessel is normal caliber The petrous the cavernous segments and supraclinoid segments are widely patent There is mild atherosclerotic irregularity involving the proximal cavernous, and distal cavernous portions of the right internal carotid artery. Right posterior communicating artery is seen opacifying the right posterior cerebral artery and the distal basilar artery. The right middle cerebral artery has approximately 30 40% stenosis of the right M1 segment The trifurcation branches appear to be widely patent into the capillary and venous phases The right anterior cerebral artery A1 segment and distally demonstrate wide patency is into the capillary and venous phases There is simultaneous cross-filling via the anterior communicating artery of the left anterior cerebral artery A2 segment and distally. Also demonstrated is opacification of the left anterior cerebral A1 segment, with flow noted into the left middle cerebral artery distribution. Unopacified blood is seen in the left middle cerebral artery from the collateral circulation. 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 More distally the vessel is seen to opacify to the  cranial skull base There is severe focal of focal stenosis of the left vertebrobasilar junction just distal to the origin of the left posterior-inferior cerebellar artery Additionally there is a approximately 70% stenosis of the proximal basilar artery More distally the basilar artery demonstrates mild caliber irregularity. There is a opacification of the left posterior cerebral artery the superior cerebellar arteries left greater than right, and the  anterior-inferior cerebellar arteries Retrograde opacification of the right vertebrobasilar junction to the level of the right posterior-inferior cerebellar arteries is noted. Caliber irregularity of the right posterior-inferior cerebellar artery suggests intracranial arteriosclerosis. Left internal carotid artery occlusion or revascularization attempt The diagnostic JB 1 catheter left common carotid artery was then exchanged over a 0.035 inch 300 cm a 7 French Pinnacle sheath in the right groin. A diagnostic JB 1 catheter were then advanced over the rise exchange guidewire to the descending thoracic aorta. The 7 French Pinnacle sheath was then exchanged over a 0.035 inch 100 cm Amplatz stiff guidewire for a a 8 French 55 cm Brite tip neurovascular sheath using biplane roadmap technique and constant fluoroscopic guidance. Good good aspiration was obtained from the side port of the neurovascular sheath. This was then connected to continuous heparinized saline infusion. The embolus guidewire was then removed. Through the 8 French neurovascular sheath, over a 0.035 inch roadrunner guidewire, a 5 5 [place] JB 1 catheter was advanced to the recover region and select position in the left common carotid artery. Using biplane technique and constant fluoroscopic guidance, over the of the 5 inch roadrunner guidewire, the 5 [place] JB 1 catheter was advanced to the left external carotid artery and exchanged over a 0.035 inch 300 cm rise exchange guidewire for an 8 French 85 cm flow gap balloon guide catheter which had been prepped with 50% contrast and 50% heparinized saline infusion The guidewire was removed. Good aspiration obtained from the hub of the 8 [place] Flo guide catheter. Gentle contrast injection demonstrated no evidence of spasms dissections or of intraluminal filling defects. At this time, in a coaxial manner and with constant heparinized saline infusion, a tree approached 1 microcatheter was advanced over a point  0.014 inch soft tip synchro micro guidewire to the distal end of the Flo guide catheter The Flood guide catheter was advanced to just inside the left internal carotid artery bulb. Thereafter, multiple attempts were made to advance the microcatheter over the micro guidewire first with Ace synchro soft and then a regular synchro guidewire. In 016 headliner double angled micro guidewire was also utilized in order to access the occluded left internal carotid artery. It was noted that there was a severe tortuosity at the junction of the proximal and the middle 1/3 of the left internal carotid artery Following multiple attempts it was decided to stop. A control was then performed through the [facility] guide catheter in the left common carotid artery which revealed brisk cross-filling of the left internal carotid artery distal cavernous and supraclinoid segments from the left external carotid artery branches via the ipsilateral ophthalmic artery Flow is noted into the left middle cerebral artery as well Also noted was a retrograde opacification from the left posterior communicating artery into the supraclinoid left ICA and then middle cerebral artery An earlier diagnostic catheter arteriogram of the right common carotid artery he had revealed partial cross-filling via the anterior communicating artery from the right internal carotid artery Given the the collateral circulation, it was decided to stop the procedure. Throughout the procedure, the patient's blood pressure and  neurological status remained stable No evidence of hemodynamic instability was noted No gross mass-effect or move filling defects or extravasation was seen Prior to the attempted revascularization of the left internal carotid artery occlusion, the patient was given 180 mg of Brilinta, and 81 mg of aspirin orally the an orogastric tube. Also the patient was loaded with 3000 units IV heparin in order to facilitate revascularization. The the 8 [place] flu guide  catheter was then retrieved and removed. The 8 [place] Brite tip neurovascular sheath was then exchanged over a J-tip guidewire for a in 8 French 35 cm Brite tip neurovascular sheath. This was then connected to continuous heparinized saline infusion An arteriogram performed through this 3 5 cm 8 French Brite tip neurovascular sheath in the abdominal aorta reveal excellent flow through the common iliacs, the external and internal iliacs, and also the visualized portions of the common femoral arteries below the level of the inguinal ligaments. At the end of procedure, a CT of the brain revealed no evidence of gross intracranial hemorrhage,, or mass effect or midline shift. The patient was left intubated on account of his wound at the time of his intubation The right groin appeared soft without evidence of a hematoma. Distal pulses in the dorsalis pedis, and posterior regions remained dopplerable bilaterally. The patient was then transferred to the neuro ICU for further post stroke management. IMPRESSION: . 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 Approximately 90% stenosis of the dominant left vertebrobasilar junction distal to the origin of the left posterior-inferior cerebellar artery, and of approximately 70% of the proximal basilar artery. 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 PLAN: Follow-up in the clinic 1 month post discharge Electronically Signed   By: Julieanne Cotton M.D.   On: 08/28/2018 16:08   Ct Cerebral Perfusion W Contrast  Result Date: 08/27/2018 CLINICAL DATA:  Right-sided weakness onset this morning. EXAM: CT PERFUSION BRAIN TECHNIQUE: Multiphase CT imaging of the brain was performed following IV bolus contrast injection. Subsequent parametric perfusion maps were calculated using RAPID software. CONTRAST:  40mL ISOVUE-370  IOPAMIDOL (ISOVUE-370) INJECTION 76% COMPARISON:  CT a head today FINDINGS: CT Brain Perfusion Findings: CBF (<30%) Volume: 0mL Perfusion (Tmax>6.0s) volume: Mismatch Volume: ASPECTS on noncontrast CT Head today, not calculated due to extensive chronic ischemic change. No definite acute infarct on CT. Infarct Core: 0 mL Infarction Location:Delayed perfusion throughout the left MCA territory with apparent sparing of the basal ganglia. There is occlusion of the left internal carotid artery on CTA, age indeterminate. Left middle cerebral artery is diseased and may have delayed perfusion due to collateral circulation and stenosis. IMPRESSION: 155 mL of delayed perfusion in the left MCA territory. No core infarction. Delayed perfusion left MCA territory may be due to severe intracranial atherosclerotic disease as well as occlusion of the left internal carotid artery. Age of the internal carotid artery occlusion is indeterminate. It is possible this delayed perfusion is a chronic or possibly acute finding. Electronically Signed   By: Marlan Palau M.D.   On: 08/27/2018 16:12   Dg Chest Port 1 View  Result Date: 09/03/2018 CLINICAL DATA:  Shortness of breath. EXAM: PORTABLE CHEST 1 VIEW COMPARISON:  Radiograph September 02, 2018. FINDINGS: Stable cardiomegaly. Atherosclerosis of thoracic aorta is noted. No pneumothorax is noted. Right internal jugular catheter is unchanged in position. Stable bilateral pulmonary edema is noted with minimal pleural effusions.  Bony thorax is unremarkable. IMPRESSION: Stable cardiomegaly with bilateral pulmonary edema and minimal pleural effusions. Aortic Atherosclerosis (ICD10-I70.0). Electronically Signed   By: Lupita Raider, M.D.   On: 09/03/2018 12:33   Dg Chest Port 1 View  Result Date: 09/02/2018 CLINICAL DATA:  Acute respiratory distress. EXAM: PORTABLE CHEST 1 VIEW COMPARISON:  One-view chest x-ray 09/01/2018 FINDINGS: The heart is enlarged. Patient has been  extubated. NG tube was removed. A right IJ line remains. Atherosclerotic changes are present at the aortic arch. Diffuse interstitial edema is stable. Bibasilar airspace disease is worse on the left. IMPRESSION: 1. Interval extubation and removal of NG tube. 2. Stable cardiomegaly and edema consistent with congestive heart failure. 3. Aortic atherosclerosis. Electronically Signed   By: Marin Roberts M.D.   On: 09/02/2018 07:29   Dg Chest Port 1 View  Result Date: 09/01/2018 CLINICAL DATA:  Respiratory failure. EXAM: PORTABLE CHEST 1 VIEW COMPARISON:  08/31/2018 FINDINGS: Endotracheal tube terminates 4.2 cm above the carina. Right jugular catheter terminates over the mid SVC. Enteric tube courses into the left upper abdomen. The cardiac silhouette remains enlarged. Patchy airspace opacities throughout both lungs have not significantly changed. IMPRESSION: Bilateral airspace disease without significant interval change. Electronically Signed   By: Sebastian Ache M.D.   On: 09/01/2018 06:54   Dg Chest Port 1 View  Result Date: 08/31/2018 CLINICAL DATA:  CXR for Respiratory failure. Hx of HTN, stroke, AAA, Diabetes, and Cardiomyopathy. EXAM: PORTABLE CHEST 1 VIEW COMPARISON:  08/30/2018 FINDINGS: Endotracheal tube is in place, tip 3.8 centimeters above the carina. Nasogastric tube is in place, tip beyond the gastroesophageal junction and off the image. A RIGHT IJ central line tip overlies the superior vena cava. Patient is rotated towards the LEFT. The heart is enlarged. There is persistent airspace filling opacity throughout the RIGHT lung, slightly improved. There is significant opacity in the LEFT lung base, consistent with atelectasis or consolidation and increased since 08/27/2018. IMPRESSION: 1. Slight improvement in aeration of the RIGHT lung. 2. Increased opacity in the LEFT lung base. Electronically Signed   By: Norva Pavlov M.D.   On: 08/31/2018 09:52   Dg Chest Port 1 View  Result Date:  08/30/2018 CLINICAL DATA:  Respiratory failure. Cardiomyopathy. EXAM: PORTABLE CHEST 1 VIEW COMPARISON:  08/29/2018 and 08/27/2018 FINDINGS: Endotracheal tube and NG tube and central line appear unchanged and in good position. There has been progression of the bilateral diffuse pulmonary infiltrates, particularly in the right upper lung zone of the left lung base. Heart size and pulmonary vascularity are normal. No discrete effusions. Aortic atherosclerosis. No acute bone abnormality. IMPRESSION: Progressive bilateral pulmonary infiltrates. Aortic Atherosclerosis (ICD10-I70.0). Electronically Signed   By: Francene Boyers M.D.   On: 08/30/2018 08:48   Dg Chest Port 1 View  Result Date: 08/29/2018 CLINICAL DATA:  Central line placement EXAM: PORTABLE CHEST 1 VIEW COMPARISON:  08/29/2018 FINDINGS: Right central line is been placed with the tip in the SVC. No pneumothorax. Endotracheal tube and NG tube are unchanged. Cardiomegaly with vascular congestion and mild pulmonary edema, stable. IMPRESSION: Right central line tip in the SVC. No pneumothorax. Continued mild CHF. Electronically Signed   By: Charlett Nose M.D.   On: 08/29/2018 11:58   Dg Chest Port 1 View  Result Date: 08/29/2018 CLINICAL DATA:  PT presented to [facility] ED on 08/27/18 with complaints of right lower extremity weakness, dyspnea and chest pressure. Hx of stroke, Peripheral vascualar disease, cardiomyopathy, and A-fib. EXAM: PORTABLE CHEST 1 VIEW COMPARISON:  08/27/2018 FINDINGS: Mild enlargement of the cardiac silhouette, stable. No mediastinal or hilar masses. There is central vascular congestion. Mild hazy opacity noted in the perihilar regions and medial lung bases bilaterally. No convincing pleural effusion and no pneumothorax. Endotracheal tube tip projects 3.8 cm above the Carina. Nasogastric tube passes below the diaphragm well into the stomach. IMPRESSION: 1. Findings are consistent with mild congestive heart failure with evidence of  slight improvement when compared to 08/27/2018. No new abnormalities. 2. Support apparatus is well positioned. Electronically Signed   By: Amie Portland M.D.   On: 08/29/2018 07:26   Dg Chest Port 1 View  Result Date: 08/28/2018 CLINICAL DATA:  Shortness of Breath EXAM: PORTABLE CHEST 1 VIEW COMPARISON:  08/27/2018 FINDINGS: Cardiac shadow is enlarged in size. Aortic calcifications are noted. Endotracheal tube is noted at the level of the carina. This should be withdrawn 2-3 cm. Nasogastric catheter is noted within the stomach. Lungs are well aerated bilaterally with mild interstitial edema similar to that seen on the prior exam. IMPRESSION: Interval intubation with the endotracheal tube at the level of the carina. This should be withdrawn 2-3 cm. Stable edema bilaterally. These results will be called to the ordering clinician or representative by the Radiologist Assistant, and communication documented in the PACS or zVision Dashboard. Electronically Signed   By: Alcide Clever M.D.   On: 08/28/2018 00:19   Dg Abd Portable 1v  Result Date: 08/28/2018 CLINICAL DATA:  Orogastric tube placement. EXAM: PORTABLE ABDOMEN - 1 VIEW COMPARISON:  None. FINDINGS: The patient's enteric tube is seen ending overlying the body of the stomach, with the side port about the fundus of the stomach. The visualized bowel gas pattern is grossly unremarkable. A small left pleural effusion is noted. Increased interstitial markings raise concern for pulmonary edema. Vascular congestion is noted. The patient's endotracheal tube is seen ending 2-3 cm above the carina. No acute osseous abnormalities are identified. IMPRESSION: 1. Enteric tube seen ending overlying the body of the stomach, with the side port about the fundus of the stomach. 2. Small left pleural effusion noted. Increased interstitial markings raise concern for pulmonary edema. Electronically Signed   By: Roanna Raider M.D.   On: 08/28/2018 22:08   Ir Percutaneous Art  Thrombectomy/infusion Intracranial Inc Diag Angio  Result Date: 08/31/2018 CLINICAL DATA:  New onset right-sided weakness with mild word-finding difficulties. 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 EXAM: IR ANGIO INTRA EXTRACRAN SEL COM CAROTID INNOMINATE UNI RIGHT MOD SED; IR PERCUTANEOUS ART THORMBECTOMY/INFUSION INTRACRANIAL INCLUDE DIAG ANGIO; IR ANGIO VERTEBRAL SEL SUBCLAVIAN INNOMINATE UNI LEFT MOD SED; IR CT HEAD LIMITED COMPARISON:  CT angiogram of the head and neck of 08/27/2018 MEDICATIONS: Heparin 3,000 units IV; . Ancef 2 g IV antibiotic was administered within 1 hour of the procedure ANESTHESIA/SEDATION: General anesthesia CONTRAST:  Isovue 300 approximately 100 mL FLUOROSCOPY TIME:  Fluoroscopy Time: 60 minutes 0 seconds (4327 mGy). COMPLICATIONS: None immediate. TECHNIQUE: Informed written consent was obtained from the patient after a thorough discussion of the procedural risks, benefits and alternatives. All questions were addressed. Maximal Sterile Barrier Technique was utilized including caps, mask, sterile gowns, sterile gloves, sterile drape, hand hygiene and skin antiseptic. A timeout was performed prior to the initiation of the procedure. The right groin was prepped and draped in the usual sterile fashion. Thereafter using  modified Seldinger technique, transfemoral access into the right common femoral artery was obtained without difficulty. Over a 0.035 inch guidewire, a 5 French Pinnacle sheath was inserted. Through this, and also over 0.035 inch guidewire, a 5 [place] JB 1 catheter was advanced to the aortic arch region and selectively positioned in the right common carotid artery, the innominate artery , the left common carotid artery and the left vertebral artery. FINDINGS: The left common carotid artery tear g demonstrates  a moderate tortuosity of the proximal left common carotid artery The left external carotid artery proximally has a mild to moderate stenosis. Is branches are otherwise normally opacified The left internal carotid artery at the bulb would demonstrates the complete angiographic occlusion with no evidence of a string sign on the delayed arterial images. The innominate artery arteriogram demonstrates patency of the right subclavian artery and the right common carotid artery proximally There is no angiographic evidence of the right vertebral artery The right common carotid bifurcation demonstrates the right external carotid artery and its major branches to be widely patent The right internal carotid artery just distal to the bulb has a severe 90% plus stenosis. Distal to this there is a U-shaped configuration of the right internal carotid artery. More distally the vessel is normal caliber The petrous the cavernous segments and supraclinoid segments are widely patent There is mild atherosclerotic irregularity involving the proximal cavernous, and distal cavernous portions of the right internal carotid artery. Right posterior communicating artery is seen opacifying the right posterior cerebral artery and the distal basilar artery. The right middle cerebral artery has approximately 30 40% stenosis of the right M1 segment The trifurcation branches appear to be widely patent into the capillary and venous phases The right anterior cerebral artery A1 segment and distally demonstrate wide patency is into the capillary and venous phases There is simultaneous cross-filling via the anterior communicating artery of the left anterior cerebral artery A2 segment and distally. Also demonstrated is opacification of the left anterior cerebral A1 segment, with flow noted into the left middle cerebral artery distribution. Unopacified blood is seen in the left middle cerebral artery from the collateral circulation. The left subclavian  arteriogram demonstrates mild stenosis at the origin of the right subclavian artery Mild atherosclerotic disease is noted of the subclavian artery and in the region of the thyrocervical trunk The dominant left vertebral artery origin is widely patent The vessel demonstrates mild tortuosity just distal to this. More distally the vessel is seen to opacify to the cranial skull base There is severe focal of focal stenosis of the left vertebrobasilar junction just distal to the origin of the left posterior-inferior cerebellar artery Additionally there is a approximately 70% stenosis of the proximal basilar artery More distally the basilar artery demonstrates mild caliber irregularity. There is a opacification of the left posterior cerebral artery the superior cerebellar arteries left greater than right, and the anterior-inferior cerebellar arteries Retrograde opacification of the right vertebrobasilar junction to the level of the right posterior-inferior cerebellar arteries is noted. Caliber irregularity of the right posterior-inferior cerebellar artery suggests intracranial arteriosclerosis. Left internal carotid artery occlusion or revascularization attempt The diagnostic JB 1 catheter left common carotid artery was then exchanged over a 0.035 inch 300 cm a 7 French Pinnacle sheath in the right groin. A diagnostic JB 1 catheter were then advanced over the rise exchange guidewire to the descending thoracic aorta. The 7 French Pinnacle sheath was then exchanged over a 0.035 inch 100 cm Amplatz stiff guidewire for a  a 8 French 55 cm Brite tip neurovascular sheath using biplane roadmap technique and constant fluoroscopic guidance. Good good aspiration was obtained from the side port of the neurovascular sheath. This was then connected to continuous heparinized saline infusion. The embolus guidewire was then removed. Through the 8 French neurovascular sheath, over a 0.035 inch roadrunner guidewire, a 5 5 [place] JB 1  catheter was advanced to the recover region and select position in the left common carotid artery. Using biplane technique and constant fluoroscopic guidance, over the of the 5 inch roadrunner guidewire, the 5 [place] JB 1 catheter was advanced to the left external carotid artery and exchanged over a 0.035 inch 300 cm rise exchange guidewire for an 8 French 85 cm flow gap balloon guide catheter which had been prepped with 50% contrast and 50% heparinized saline infusion The guidewire was removed. Good aspiration obtained from the hub of the 8 [place] Flo guide catheter. Gentle contrast injection demonstrated no evidence of spasms dissections or of intraluminal filling defects. At this time, in a coaxial manner and with constant heparinized saline infusion, a tree approached 1 microcatheter was advanced over a point 0.014 inch soft tip synchro micro guidewire to the distal end of the Flo guide catheter The Flood guide catheter was advanced to just inside the left internal carotid artery bulb. Thereafter, multiple attempts were made to advance the microcatheter over the micro guidewire first with Ace synchro soft and then a regular synchro guidewire. In 016 headliner double angled micro guidewire was also utilized in order to access the occluded left internal carotid artery. It was noted that there was a severe tortuosity at the junction of the proximal and the middle 1/3 of the left internal carotid artery Following multiple attempts it was decided to stop. A control was then performed through the [facility] guide catheter in the left common carotid artery which revealed brisk cross-filling of the left internal carotid artery distal cavernous and supraclinoid segments from the left external carotid artery branches via the ipsilateral ophthalmic artery Flow is noted into the left middle cerebral artery as well Also noted was a retrograde opacification from the left posterior communicating artery into the supraclinoid left ICA  and then middle cerebral artery An earlier diagnostic catheter arteriogram of the right common carotid artery he had revealed partial cross-filling via the anterior communicating artery from the right internal carotid artery Given the the collateral circulation, it was decided to stop the procedure. Throughout the procedure, the patient's blood pressure and neurological status remained stable No evidence of hemodynamic instability was noted No gross mass-effect or move filling defects or extravasation was seen Prior to the attempted revascularization of the left internal carotid artery occlusion, the patient was given 180 mg of Brilinta, and 81 mg of aspirin orally the an orogastric tube. Also the patient was loaded with 3000 units IV heparin in order to facilitate revascularization. The the 8 [place] flu guide catheter was then retrieved and removed. The 8 [place] Brite tip neurovascular sheath was then exchanged over a J-tip guidewire for a in 8 French 35 cm Brite tip neurovascular sheath. This was then connected to continuous heparinized saline infusion An arteriogram performed through this 3 5 cm 8 French Brite tip neurovascular sheath in the abdominal aorta reveal excellent flow through the common iliacs, the external and internal iliacs, and also the visualized portions of the common femoral arteries below the level of the inguinal ligaments. At the end of procedure, a CT of the brain revealed  no evidence of gross intracranial hemorrhage,, or mass effect or midline shift. The patient was left intubated on account of his wound at the time of his intubation The right groin appeared soft without evidence of a hematoma. Distal pulses in the dorsalis pedis, and posterior regions remained dopplerable bilaterally. The patient was then transferred to the neuro ICU for further post stroke management. IMPRESSION: . 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 Severe  high-grade 90% plus stenosis of the right internal carotid artery just distal to the bulb. Approximately 90% stenosis of the dominant left vertebrobasilar junction distal to the origin of the left posterior-inferior cerebellar artery, and of approximately 70% of the proximal basilar artery. 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 PLAN: Follow-up in the clinic 1 month post discharge Electronically Signed   By: Julieanne Cotton M.D.   On: 08/28/2018 16:08   Ct Head Code Stroke Wo Contrast`  Result Date: 08/27/2018 CLINICAL DATA:  Code stroke. Slurred speech beginning 3 weeks ago. Right-sided weakness beginning today. EXAM: CT HEAD WITHOUT CONTRAST TECHNIQUE: Contiguous axial images were obtained from the base of the skull through the vertex without intravenous contrast. COMPARISON:  CT 03/07/2013 FINDINGS: Brain: The brainstem and cerebellum appear normal by CT. Cerebral hemispheres show age related volume loss. There are old cortical and subcortical infarctions in the right frontal lobe, left parietal vertex, left frontal lobe, and left frontoparietal vertex. The show atrophy and encephalomalacia with gliosis. There is no finding of acute or subacute infarction on this CT. There is old lacunar infarction in the left caudate. Vascular: There is atherosclerotic calcification of the major vessels at the base of the brain. No evidence of acute hyperdense vessel. Skull: Negative Sinuses/Orbits: Clear/normal Other: None ASPECTS ([place] Stroke Program Early CT Score) Aspects is difficult in this patient with old infarctions. I do not suspect an acute insult. IMPRESSION: 1. No acute finding by CT. 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 2. 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 No acute finding is suspected by CT. 3. These results were called by telephone at the time of  interpretation on 08/27/2018 at 11:47 am to Dr. Daryel November , who verbally acknowledged these results. Electronically Signed   By: Paulina Fusi M.D.   On: 08/27/2018 11:49   Ir Angio Intra Extracran Sel Com Carotid Innominate Uni R Mod Sed  Result Date: 08/31/2018 CLINICAL DATA:  New onset right-sided weakness with mild word-finding difficulties. 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 EXAM: IR ANGIO INTRA EXTRACRAN SEL COM CAROTID INNOMINATE UNI RIGHT MOD SED; IR PERCUTANEOUS ART THORMBECTOMY/INFUSION INTRACRANIAL INCLUDE DIAG ANGIO; IR ANGIO VERTEBRAL SEL SUBCLAVIAN INNOMINATE UNI LEFT MOD SED; IR CT HEAD LIMITED COMPARISON:  CT angiogram of the head and neck of 08/27/2018 MEDICATIONS: Heparin 3,000 units IV; . Ancef 2 g IV antibiotic was administered within 1 hour of the procedure ANESTHESIA/SEDATION: General anesthesia CONTRAST:  Isovue 300 approximately 100 mL FLUOROSCOPY TIME:  Fluoroscopy Time: 60 minutes 0 seconds (4327 mGy). COMPLICATIONS: None immediate. TECHNIQUE: Informed written consent was obtained from the patient after a thorough discussion of the procedural risks, benefits and alternatives. All questions were addressed. Maximal Sterile Barrier Technique was utilized including caps, mask, sterile gowns, sterile gloves, sterile drape, hand hygiene and  skin antiseptic. A timeout was performed prior to the initiation of the procedure. The right groin was prepped and draped in the usual sterile fashion. Thereafter using modified Seldinger technique, transfemoral access into the right common femoral artery was obtained without difficulty. Over a 0.035 inch guidewire, a 5 French Pinnacle sheath was inserted. Through this, and also over 0.035 inch guidewire, a 5 [place] JB 1 catheter was advanced to the aortic arch region and  selectively positioned in the right common carotid artery, the innominate artery , the left common carotid artery and the left vertebral artery. FINDINGS: The left common carotid artery tear g demonstrates a moderate tortuosity of the proximal left common carotid artery The left external carotid artery proximally has a mild to moderate stenosis. Is branches are otherwise normally opacified The left internal carotid artery at the bulb would demonstrates the complete angiographic occlusion with no evidence of a string sign on the delayed arterial images. The innominate artery arteriogram demonstrates patency of the right subclavian artery and the right common carotid artery proximally There is no angiographic evidence of the right vertebral artery The right common carotid bifurcation demonstrates the right external carotid artery and its major branches to be widely patent The right internal carotid artery just distal to the bulb has a severe 90% plus stenosis. Distal to this there is a U-shaped configuration of the right internal carotid artery. More distally the vessel is normal caliber The petrous the cavernous segments and supraclinoid segments are widely patent There is mild atherosclerotic irregularity involving the proximal cavernous, and distal cavernous portions of the right internal carotid artery. Right posterior communicating artery is seen opacifying the right posterior cerebral artery and the distal basilar artery. The right middle cerebral artery has approximately 30 40% stenosis of the right M1 segment The trifurcation branches appear to be widely patent into the capillary and venous phases The right anterior cerebral artery A1 segment and distally demonstrate wide patency is into the capillary and venous phases There is simultaneous cross-filling via the anterior communicating artery of the left anterior cerebral artery A2 segment and distally. Also demonstrated is opacification of the left anterior  cerebral A1 segment, with flow noted into the left middle cerebral artery distribution. Unopacified blood is seen in the left middle cerebral artery from the collateral circulation. 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 More distally the vessel is seen to opacify to the cranial skull base There is severe focal of focal stenosis of the left vertebrobasilar junction just distal to the origin of the left posterior-inferior cerebellar artery Additionally there is a approximately 70% stenosis of the proximal basilar artery More distally the basilar artery demonstrates mild caliber irregularity. There is a opacification of the left posterior cerebral artery the superior cerebellar arteries left greater than right, and the anterior-inferior cerebellar arteries Retrograde opacification of the right vertebrobasilar junction to the level of the right posterior-inferior cerebellar arteries is noted. Caliber irregularity of the right posterior-inferior cerebellar artery suggests intracranial arteriosclerosis. Left internal carotid artery occlusion or revascularization attempt The diagnostic JB 1 catheter left common carotid artery was then exchanged over a 0.035 inch 300 cm a 7 French Pinnacle sheath in the right groin. A diagnostic JB 1 catheter were then advanced over the  rise exchange guidewire to the descending thoracic aorta. The 7 French Pinnacle sheath was then exchanged over a 0.035 inch 100 cm Amplatz stiff guidewire for a a 8 French 55 cm Brite tip neurovascular sheath using biplane roadmap technique and constant fluoroscopic guidance. Good good aspiration was obtained from the side port of the neurovascular sheath. This was then  connected to continuous heparinized saline infusion. The embolus guidewire was then removed. Through the 8 French neurovascular sheath, over a 0.035 inch roadrunner guidewire, a 5 5 [place] JB 1 catheter was advanced to the recover region and select position in the left common carotid artery. Using biplane technique and constant fluoroscopic guidance, over the of the 5 inch roadrunner guidewire, the 5 [place] JB 1 catheter was advanced to the left external carotid artery and exchanged over a 0.035 inch 300 cm rise exchange guidewire for an 8 French 85 cm flow gap balloon guide catheter which had been prepped with 50% contrast and 50% heparinized saline infusion The guidewire was removed. Good aspiration obtained from the hub of the 8 [place] Flo guide catheter. Gentle contrast injection demonstrated no evidence of spasms dissections or of intraluminal filling defects. At this time, in a coaxial manner and with constant heparinized saline infusion, a tree approached 1 microcatheter was advanced over a point 0.014 inch soft tip synchro micro guidewire to the distal end of the Flo guide catheter The Flood guide catheter was advanced to just inside the left internal carotid artery bulb. Thereafter, multiple attempts were made to advance the microcatheter over the micro guidewire first with Ace synchro soft and then a regular synchro guidewire. In 016 headliner double angled micro guidewire was also utilized in order to access the occluded left internal carotid artery. It was noted that there was a severe tortuosity at the junction of the proximal and the middle 1/3 of the left internal carotid artery Following multiple attempts it was decided to stop. A control was then performed through the [facility] guide catheter in the left common carotid artery which revealed brisk cross-filling of the left internal carotid artery distal cavernous and supraclinoid segments from the left external carotid artery branches via the ipsilateral  ophthalmic artery Flow is noted into the left middle cerebral artery as well Also noted was a retrograde opacification from the left posterior communicating artery into the supraclinoid left ICA and then middle cerebral artery An earlier diagnostic catheter arteriogram of the right common carotid artery he had revealed partial cross-filling via the anterior communicating artery from the right internal carotid artery Given the the collateral circulation, it was decided to stop the procedure. Throughout the procedure, the patient's blood pressure and neurological status remained stable No evidence of hemodynamic instability was noted No gross mass-effect or move filling defects or extravasation was seen Prior to the attempted revascularization of the left internal carotid artery occlusion, the patient was given 180 mg of Brilinta, and 81 mg of aspirin orally the an orogastric tube. Also the patient was loaded with 3000 units IV heparin in order to facilitate revascularization. The the 8 [place] flu guide catheter was then retrieved and removed. The 8 [place] Brite tip neurovascular sheath was then exchanged over a J-tip guidewire for a in 8 French 35 cm Brite tip neurovascular sheath. This was then connected to continuous heparinized saline infusion An arteriogram performed through this 3 5 cm 8 French Brite tip neurovascular sheath in the abdominal aorta reveal excellent flow through the common iliacs, the external and internal iliacs, and  also the visualized portions of the common femoral arteries below the level of the inguinal ligaments. At the end of procedure, a CT of the brain revealed no evidence of gross intracranial hemorrhage,, or mass effect or midline shift. The patient was left intubated on account of his wound at the time of his intubation The right groin appeared soft without evidence of a hematoma. Distal pulses in the dorsalis pedis, and posterior regions remained dopplerable bilaterally. The patient  was then transferred to the neuro ICU for further post stroke management. IMPRESSION: . 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 Approximately 90% stenosis of the dominant left vertebrobasilar junction distal to the origin of the left posterior-inferior cerebellar artery, and of approximately 70% of the proximal basilar artery. 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 PLAN: Follow-up in the clinic 1 month post discharge Electronically Signed   By: Julieanne Cotton M.D.   On: 08/28/2018 16:08   Ir Angio Vertebral Sel Subclavian Innominate Uni L Mod Sed  Result Date: 08/31/2018 CLINICAL DATA:  New onset right-sided weakness with mild word-finding difficulties. 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 EXAM: IR ANGIO INTRA EXTRACRAN SEL COM CAROTID INNOMINATE UNI RIGHT MOD SED; IR PERCUTANEOUS ART THORMBECTOMY/INFUSION INTRACRANIAL INCLUDE DIAG ANGIO; IR ANGIO VERTEBRAL SEL SUBCLAVIAN INNOMINATE UNI LEFT MOD SED; IR CT HEAD LIMITED COMPARISON:  CT angiogram of the head and neck of 08/27/2018 MEDICATIONS: Heparin 3,000 units IV; . Ancef 2 g IV antibiotic was administered within 1 hour of the procedure ANESTHESIA/SEDATION: General anesthesia CONTRAST:  Isovue 300 approximately 100 mL FLUOROSCOPY TIME:  Fluoroscopy Time: 60 minutes 0 seconds (4327 mGy). COMPLICATIONS: None immediate. TECHNIQUE: Informed written consent was obtained from the patient after a thorough discussion of the procedural risks, benefits and alternatives. All questions were addressed. Maximal Sterile Barrier Technique was utilized including caps, mask, sterile gowns, sterile gloves, sterile drape, hand hygiene  and skin antiseptic. A timeout was performed prior to the initiation of the procedure. The right groin was prepped and draped in the usual sterile fashion. Thereafter using modified Seldinger technique, transfemoral access into the right common femoral artery was obtained without difficulty. Over a 0.035 inch guidewire, a 5 French Pinnacle sheath was inserted. Through this, and also over 0.035 inch guidewire, a 5 [place] JB 1 catheter was advanced to the aortic arch region and selectively positioned in the right common carotid artery, the innominate artery , the left common carotid artery and the left vertebral artery. FINDINGS: The left common carotid artery tear g demonstrates a moderate tortuosity of the proximal left common carotid artery The left external carotid artery proximally has a mild to moderate stenosis. Is branches are otherwise normally opacified The left internal carotid artery at the bulb would demonstrates the complete angiographic occlusion with no evidence of a string sign on the delayed arterial images. The innominate artery arteriogram demonstrates patency of the right subclavian artery and the right common carotid artery proximally There is no angiographic evidence of the right vertebral artery The right common carotid bifurcation demonstrates the right external carotid artery and its major branches to be widely patent The right internal carotid artery just distal to the bulb has a severe 90% plus stenosis. Distal to this there  is a U-shaped configuration of the right internal carotid artery. More distally the vessel is normal caliber The petrous the cavernous segments and supraclinoid segments are widely patent There is mild atherosclerotic irregularity involving the proximal cavernous, and distal cavernous portions of the right internal carotid artery. Right posterior communicating artery is seen opacifying the right posterior cerebral artery and the distal basilar artery. The right middle  cerebral artery has approximately 30 40% stenosis of the right M1 segment The trifurcation branches appear to be widely patent into the capillary and venous phases The right anterior cerebral artery A1 segment and distally demonstrate wide patency is into the capillary and venous phases There is simultaneous cross-filling via the anterior communicating artery of the left anterior cerebral artery A2 segment and distally. Also demonstrated is opacification of the left anterior cerebral A1 segment, with flow noted into the left middle cerebral artery distribution. Unopacified blood is seen in the left middle cerebral artery from the collateral circulation. 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 More distally the vessel is seen to opacify to the cranial skull base There is severe focal of focal stenosis of the left vertebrobasilar junction just distal to the origin of the left posterior-inferior cerebellar artery Additionally there is a approximately 70% stenosis of the proximal basilar artery More distally the basilar artery demonstrates mild caliber irregularity. There is a opacification of the left posterior cerebral artery the superior cerebellar arteries left greater than right, and the anterior-inferior cerebellar arteries Retrograde opacification of the right vertebrobasilar junction to the level of the right posterior-inferior cerebellar arteries is noted. Caliber irregularity of the right posterior-inferior cerebellar artery suggests intracranial arteriosclerosis. Left internal carotid artery occlusion or revascularization attempt The diagnostic JB 1 catheter left common carotid artery was then exchanged over a 0.035 inch  300 cm a 7 French Pinnacle sheath in the right groin. A diagnostic JB 1 catheter were then advanced over the rise exchange guidewire to the descending thoracic aorta. The 7 French Pinnacle sheath was then exchanged over a 0.035 inch 100 cm Amplatz stiff guidewire for a a 8 French 55 cm Brite tip neurovascular sheath using biplane roadmap technique and constant fluoroscopic guidance. Good good aspiration was obtained from the side port of the neurovascular sheath. This was then connected to continuous heparinized saline infusion. The embolus guidewire was then removed. Through the 8 French neurovascular sheath, over a 0.035 inch roadrunner guidewire, a 5 5 [place] JB 1 catheter was advanced to the recover region and select position in the left common carotid artery. Using biplane technique and constant fluoroscopic guidance, over the of the 5 inch roadrunner guidewire, the 5 [place] JB 1 catheter was advanced to the left external carotid artery and exchanged over a 0.035 inch 300 cm rise exchange guidewire for an 8 French 85 cm flow gap balloon guide catheter which had been prepped with 50% contrast and 50% heparinized saline infusion The guidewire was removed. Good aspiration obtained from the hub of the 8 [place] Flo guide catheter. Gentle contrast injection demonstrated no evidence of spasms dissections or of intraluminal filling defects. At this time, in a coaxial manner and with constant heparinized saline infusion, a tree approached 1 microcatheter was advanced over a point 0.014 inch soft tip synchro  micro guidewire to the distal end of the Flo guide catheter The Flood guide catheter was advanced to just inside the left internal carotid artery bulb. Thereafter, multiple attempts were made to advance the microcatheter over the micro guidewire first with Ace synchro soft and then a regular synchro guidewire. In 016 headliner double angled micro guidewire was also utilized in order to access the occluded left  internal carotid artery. It was noted that there was a severe tortuosity at the junction of the proximal and the middle 1/3 of the left internal carotid artery Following multiple attempts it was decided to stop. A control was then performed through the [employer] guide catheter in the left common carotid artery which revealed brisk cross-filling of the left internal carotid artery distal cavernous and supraclinoid segments from the left external carotid artery branches via the ipsilateral ophthalmic artery Flow is noted into the left middle cerebral artery as well Also noted was a retrograde opacification from the left posterior communicating artery into the supraclinoid left ICA and then middle cerebral artery An earlier diagnostic catheter arteriogram of the right common carotid artery he had revealed partial cross-filling via the anterior communicating artery from the right internal carotid artery Given the the collateral circulation, it was decided to stop the procedure. Throughout the procedure, the patient's blood pressure and neurological status remained stable No evidence of hemodynamic instability was noted No gross mass-effect or move filling defects or extravasation was seen Prior to the attempted revascularization of the left internal carotid artery occlusion, the patient was given 180 mg of Brilinta, and 81 mg of aspirin orally the an orogastric tube. Also the patient was loaded with 3000 units IV heparin in order to facilitate revascularization. The the 8 [place] flu guide catheter was then retrieved and removed. The 8 [place] Brite tip neurovascular sheath was then exchanged over a J-tip guidewire for a in 8 French 35 cm Brite tip neurovascular sheath. This was then connected to continuous heparinized saline infusion An arteriogram performed through this 3 5 cm 8 French Brite tip neurovascular sheath in the abdominal aorta reveal excellent flow through the common iliacs, the external and internal iliacs, and  also the visualized portions of the common femoral arteries below the level of the inguinal ligaments. At the end of procedure, a CT of the brain revealed no evidence of gross intracranial hemorrhage,, or mass effect or midline shift. The patient was left intubated on account of his wound at the time of his intubation The right groin appeared soft without evidence of a hematoma. Distal pulses in the dorsalis pedis, and posterior regions remained dopplerable bilaterally. The patient was then transferred to the neuro ICU for further post stroke management. IMPRESSION: . 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 Approximately 90% stenosis of the dominant left vertebrobasilar junction distal to the origin of the left posterior-inferior cerebellar artery, and of approximately 70% of the proximal basilar artery. 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 PLAN: Follow-up in the clinic 1 month post discharge Electronically Signed   By: Julieanne Cotton M.D.   On: 08/28/2018 16:08   Vas [place] Lower Extremity Arterial Duplex  Result Date: 09/04/2018 LOWER EXTREMITY ARTERIAL DUPLEX STUDY  Current ABI: Not obtained Limitations: Cerebral angiogram 08/27/18, now with bleeding, pain, and bruising              at groin stick site. Performing Technologist:  Michelle Simonetti MHA, RDMS, RVT, RDCS  Examination Guidelines: A complete evaluation includes B-mode imaging, spectral Doppler, color Doppler, and power Doppler as needed of all accessible portions of each vessel. Bilateral testing is considered an integral part of a complete examination. Limited examinations for reoccurring indications may be performed as noted.  Right Duplex Findings: +----------+--------+-----+--------+----------+--------+           PSV cm/sRatioStenosisWaveform  Comments  +----------+--------+-----+--------+----------+--------+ CFA Prox  102                  monophasic         +----------+--------+-----+--------+----------+--------+ CFA Distal90                   monophasic         +----------+--------+-----+--------+----------+--------+ PTA Distal32                   monophasic         +----------+--------+-----+--------+----------+--------+ DP        44                   monophasic         +----------+--------+-----+--------+----------+--------+  Summary: Right: A right common femoral artery pseudoaneurysm was identified measuring 2.8 x 1.5 x 2.5 cm, with a neck measuring 1.3cm long x 0.6cm wide. Pseudoaneurysm measures 2.1cm from the surface of the skin. The proximal right common femoral artery exhibits a monophasic waveform, suggestive of possible aortoiliac obstruction. Distal posterior tibial artery exhibits monophasic flow, and the right dorsalis pedis artery exhibits monophasic flow, suggestive of proximal obstruction.  See table(s) above for measurements and observations.    Preliminary     PHYSICAL EXAM  Temp:  [97.9 F (36.6 C)-99.7 F (37.6 C)] 97.9 F (36.6 C) (01/02 1318) Pulse Rate:  [71-102] 71 (01/02 1315) Resp:  [16-33] 32 (01/02 1315) BP: (113-146)/(58-86) 132/67 (01/02 1315) SpO2:  [95 %-100 %] 97 % (01/02 1315)  General - Well nourished, well developed, in mild respiratory distress.  Right UE swelling improved  Ophthalmologic - fundi not visualized due to noncooperation.  Cardiovascular - irregularly irregular heart rate and rhythm.  Abdomen - right lower abdominal wound still oozing blood  Neuro - awake alert, in mild respiratory distress on lying down, able to following simple commands. Able to repeat sentences and spontaneous speech but paucity of speech. PERRLA, EOMI, able to track objects on the both sides, inconsistent blinking to visual threat bilaterally.  Facial symmetric.  Tongue midline.  Left upper  extremity 4/5.  Right lower extremity and right upper extremity flaccid.  Left lower extremity proximal 2+/5 and distal 3/5.  DTR 1+, Babinski negative.  Sensation symmetrical, coordination slow but intact on the left finger-to-nose and gait not tested.   ASSESSMENT/PLAN Darren.Hadley Elizeo Frazier a 78 y.o.malewith history of HTN, atrial fibrillation on Pradaxa last dose last evening, urethral stricture, cardiomyopathy, peripheral vascular disease, hypertension, hyperlipidemia, stroke with some balance issues, bilateral lymphedema vitamin D deficiencypresenting with right lower extremity numbness and weaknessand suspected new Lt ICA occlusion.Hedid not receive IV t-PA due to late presentation.  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  MRI head -left MCA and ACA territory scattered small infarcts  CTA H&N - left ICA, right VA occlusion.  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  2D Echo - EF 60 to 65%  LDL- 81  HgbA1c- 7.1  VTE prophylaxis -SCDs  aspirin 81 mg daily and Pradaxa (dabigatran) twice a dayprior to admission, now on Eliquis 5 mg twice daily  Ongoing aggressive stroke risk factor management  Therapy recommendations: SNF  Disposition: Pending  Right femoral A. 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 early consulted  No intervention needed at this time  Continue holding pressure with  dressing  Pseudoaneurysm need to be addressed in the near future with some injection  Vascular surgery on board  Aspiration pneumonitis  Fever Tmax 101.2->101.3-> afebrile->101.3-> afebrile  Leukocytosis WBC 22.2-15.5-11.4-13.9->16.6-> 19.2->14.7  CXR CHF  On lasix 60mg  bid, continue IV diuresis for one more day  Sputum culture normal flora  Off Zosyn today  UA negative  CCM on board  Respiratory distress  Was intubated on ventilation  Self extubated on 09/01/18  Off sedation  So far on nasal cannula, tolerating  CCM signed off  Atrial fibrillation, chronic  On Pradaxa PTA, compliant with medication  On metoprolol, rate controlled  On Eliquis today  On amiodarone IV, transition to p.o. today  Rate controlled  Cardiology signed off  Hypotension History of hypertension  BP stable   Off neo  Avoid hypotension due to 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 as well as basal artery stenosis  BP goal 120-160  CHF  CXR showed CHF  Fluid overload - on lasix 60 Q12, continue one more day  Anasarca much improved  Continue IV diuresis for today  Cardiology signed off  Hyperlipidemia  Lipid lowering medication WUJ:WJXBPTA:none  LDL81, goal < 70  Current lipid lowering medication:none (statin intolerant - abnormal labs)  Add zetia   Continue zetia on d/c  Diabetes  HgbA1c7.1, goal < 7.0  Uncontrolled  Hyperglycemia improved  SSI  CBG monitoring  On Levemir, continue  Other Stroke Risk Factors  Advanced age  Former cigarette smoker - quit  Obesity,Body mass index is 47.79 kg/m., recommend weight loss, diet and exercise as appropriate   Hx stroke/TIA  PVD  Other Active Problems  Urethral stricture -on Foley  Diffuse anasarca - much improved, continue diuresis one more day  Hypokalemia potassium 2.8, supplemented, recheck in a.m. as well as magnesium  Hospital day # 8  This patient is critically ill due  to left MCA and ACA infarct, bilateral ICA occlusions/stenosis, A. fib, femoral artery pseudoaneurysm, respiratory failure, aspiration pneumonia, fever and at significant risk of neurological worsening, death form recurrent stroke, hemorrhagic conversion, seizure, heart failure, septic shock, sepsis. This patient's care requires constant monitoring of vital signs, hemodynamics, respiratory and cardiac monitoring, review of multiple databases, neurological assessment, discussion with family, other specialists and medical decision making of high complexity. I spent 40 minutes of neurocritical care time in the care of this patient. I had long discussion with wife and pt at bedside, updated pt current condition, treatment plan and potential prognosis. They expressed understanding and appreciation.      Marvel PlanJindong Denym Christenberry, MD PhD Stroke Neurology 09/04/2018 2:26 PM     To contact Stroke Continuity provider, please refer to [email or web address]. After hours, contact General Neurology

## 2018-09-04 NOTE — Care Management (Signed)
#  6.   S/ W    LOUISE @ [employer] [facility] # [phone]   OPT- 2   APIXABAN : NONE FORMULARY  1. ELIQUIS  5 MG BID COVER- YES CO-PAY- $ 297.00 TIER- 3 DRUG PRIOR APPROVAL- NO  2. ELIQUIS   2.5 MG BID COVER- YES CO-PAY- $ 297.00 TIER- 3 DRUG PRIOR APPROVAL- NO  DEDUCTIBLE : NOT MET   $ 250.00 + $47.00 = $ 297.00  PREFERRED PHARMACY : YES [facility]  AND [facility]

## 2018-09-04 NOTE — Progress Notes (Addendum)
Patient noted to be in Afib with a rate of 145. RN at bedside to assess patient, mental status changes noted. Cardiology paged for HR. CCM paged, RN advised to give lopressor and call Code Stroke. Neurology at the bedside and assessment suggest Code Stroke is not needed. Wife notified of patients confusion and agitation.

## 2018-09-04 NOTE — Progress Notes (Signed)
Maddison,RN is aware of the discontinue central line removal order. She plans to discontinue the CVC. This RN just placed a new PIV.  Penni Bombard Reynolds Kittel,RN-VAST

## 2018-09-04 NOTE — Progress Notes (Signed)
S: Acute onset of agitation. No new focal deficits noted by nursing staff.  O: BP 129/80   Pulse (!) 121   Temp 98.2 F (36.8 C) (Oral)   Resp 19   Ht 5\' 9"  (1.753 m)   Wt (!) 136.9 kg   SpO2 95%   BMI 44.57 kg/m   Neurological examination: Ment: Awake with altered sensorium. Agitated. Perseverative speech. States "no" when asked to perform several motor commands. States "3" when asked to count 5 fingers.  CN: Fixates on examiner intermittently. Right facial droop. Phonation intact.  Motor: Right hemiplegia. Moves LUE and LLE purposefully.  Sensory: Noncooperative with exam but will move LUE and LLE to stimulation.   RN observed neurological exam and states that patient is back to his baseline.   A/R: 78 year old male with Lt MCA and ACA territory scattered small infarcts - embolic pattern, likely due to left ICA chronic occlusion 1. Agitation, resolved. No new focal deficits on exam. The patient states he is not in pain or discomfort. He states that he would like to see his wife. He seemed less agitated when RN stated she would try to get in touch with the patient's wife.  2. Continue frequent neuro checks.   Electronically signed: Dr. Caryl Pina

## 2018-09-04 NOTE — Consult Note (Signed)
[facility]ist of   Patient name: Darren Frazier Zarling MRN: [id number] DOB: 09-17-1940 Sex: male  REASON FOR VISIT: Evaluation of right femoral hematoma and false aneurysm  HPI: Darren Frazier is a 78 y.o. male admitted on 08/27/2018 with major stroke with right side paralysis.  Underwent cerebral arteriogram via a right groin approach with unsuccessful attempt at recanalization of his occluded left internal carotid artery.  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  Seen for further evaluation of this.  He is alert and oriented and answers questions.  His wife is present and helps with the history as well.  He did have a prior history of left leg ulceration and underwent SFA intervention at [facility].  Past Medical History:  Diagnosis Date  . Cardiomyopathy (HCC)   . Diabetes mellitus without complication (HCC)   . Dysrhythmia    atrial fibrillation  . GERD (gastroesophageal reflux disease)   . H/O: urethral stricture    self caths occasionally  . History of abdominal aortic aneurysm (AAA)    30/6934mm in diameter, stable  . History of kidney stones 2017  . Hyperlipidemia   . Hypertension   . Hypothyroidism   . Morbid obesity with BMI of 40.0-44.9, adult (HCC) 2019  . Peripheral vascular disease (HCC) 2019   lymphadema both extremities, ulceration on left big toe  . Stroke (HCC) 2011   x 2 within 6 months. some numbness over upper arms, balance off  . Tremors of nervous system   . Vitamin D deficiency     Family History  Problem Relation Age of Onset  . Diabetes Father     SOCIAL HISTORY: Social History   Tobacco Use  . Smoking status: Former Smoker    Packs/day: 4.00    Years: 30.00    Pack years: 120.00    Types: Cigarettes    Last attempt to quit: 1984    Years since quitting: 36.0  . Smokeless tobacco:  Never Used  Substance Use Topics  . Alcohol use: No    Comment: occasional beer. couple years ago    Allergies  Allergen Reactions  . Ace Inhibitors Other (See Comments)    unknown  . Omeprazole Diarrhea  . Statins Other (See Comments)    Abnormal lab results(pt unsure of what labs)    Current Facility-Administered Medications  Medication Dose Route Frequency Provider Last Rate Last Dose  . 0.9 %  sodium chloride infusion   Intravenous PRN Marvel PlanXu, Jindong, MD 10 mL/hr at 09/04/18 1315    . acetaminophen (TYLENOL) tablet 650 mg  650 mg Oral Q4H PRN Milon DikesArora, Ashish, MD       Or  . acetaminophen (TYLENOL) solution 650 mg  650 mg Per Tube Q4H PRN Milon DikesArora, Ashish, MD   650 mg at 09/01/18 0540   Or  . acetaminophen (TYLENOL) suppository 650 mg  650 mg Rectal Q4H PRN Milon DikesArora, Ashish, MD   650 mg at 09/01/18 1734  . amiodarone (PACERONE) tablet 200 mg  200 mg Oral Daily Ollis, Brandi L, NP   200 mg at 09/04/18 0830  . apixaban (ELIQUIS) tablet 5 mg  5 mg Oral BID Baldemar FridayMasters, Alison M, RPH   5 mg at 09/03/18 2102  . Chlorhexidine Gluconate Cloth 2 % PADS 6 each  6 each Topical Q0600 Marvel PlanXu, Jindong, MD      . cholecalciferol (VITAMIN  D3) tablet 1,000 Units  1,000 Units Per Tube Daily Coralyn Helling, MD   Stopped at 08/31/18 [phone]  . furosemide (LASIX) injection 60 mg  60 mg Intravenous Q12H Marvel Plan, MD   60 mg at 09/04/18 1116  . insulin aspart (novoLOG) injection 0-20 Units  0-20 Units Subcutaneous Q4H Coralyn Helling, MD   4 Units at 09/04/18 1241  . insulin detemir (LEVEMIR) injection 10 Units  10 Units Subcutaneous Daily Coralyn Helling, MD   10 Units at 09/04/18 1048  . levothyroxine (SYNTHROID, LEVOTHROID) tablet 88 mcg  88 mcg Oral Q0600 Canary Brim L, NP   88 mcg at 09/04/18 0529  . metoprolol tartrate (LOPRESSOR) tablet 50 mg  50 mg Oral BID Marvel Plan, MD      . mupirocin ointment (BACTROBAN) 2 % 1 application  1 application Nasal BID Marvel Plan, MD   1 application at 09/04/18 1102  . ondansetron  (ZOFRAN) injection 4 mg  4 mg Intravenous Q6H PRN Deveshwar, Sanjeev, MD      . polyvinyl alcohol (LIQUIFILM TEARS) 1.4 % ophthalmic solution 1 drop  1 drop Both Eyes PRN Marvel Plan, MD   1 drop at 08/31/18 0850  . potassium chloride SA (K-DUR,KLOR-CON) CR tablet 40 mEq  40 mEq Oral Q4H Marvel Plan, MD      . senna-docusate (Senokot-S) tablet 1 tablet  1 tablet Oral QHS PRN Milon Dikes, MD        REVIEW OF SYSTEMS:  Reviewed and his history and physical with nothing to add   PHYSICAL EXAM: Vitals:   09/04/18 1100 09/04/18 1200 09/04/18 1315 09/04/18 1318  BP: 130/67 (!) 141/67 132/67   Pulse: 98 96 71   Resp: (!) 32 (!) 25 (!) 32   Temp:    97.9 F (36.6 C)  TempSrc:    Oral  SpO2: 98% 98% 97%   Weight:      Height:        GENERAL: The patient is a well-nourished male, in no acute distress. The vital signs are documented above. CARDIOVASCULAR: I do not palpate pedal pulses bilaterally.  He is morbidly obese with a very large pannus overhanging his groin.  He does have significant bruising in his scrotum and thigh.  He has old blood draining from the puncture site from his arteriogram.  I do not palpate a false aneurysm. PULMONARY: There is good air exchange  MUSCULOSKELETAL: There are no major deformities or cyanosis. NEUROLOGIC: No focal weakness or paresthesias are detected. SKIN: There are no ulcers or rashes noted. PSYCHIATRIC: The patient has a normal affect.  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  MEDICAL ISSUES: Discussed this with the patient and his family.  Also discussed with Dr.Xu.  Would continue routine stroke therapy as is being done.  Will reimage in several days.  Do not feel that he is at any risk for bleeding from his false aneurysm and this is not causing any change in his lower extremity arterial flow.  Would be nearly impossible to compress due to his morbid obesity and would be difficult to inject also due to his obesity.  We  will follow with you.    Larina Earthly, MD FACS [facility] Tel [phone] Pager [phone]

## 2018-09-04 NOTE — Progress Notes (Signed)
*  PRELIMINARY RESULTS* Vascular Ultrasound Limited Right Lower Extremity Arterial Duplex has been completed. A right common femoral artery pseudoaneurysm was identified measuring 2.8 x 1.5 x 2.5 cm, with a neck measuring 1.3cm long x 0.6cm wide. Pseudoaneurysm measures 2.1cm from the surface of the skin.   The proximal right common femoral artery exhibits a monophasic waveform, suggestive of possible aortoiliac obstruction. Distal posterior tibial artery exhibits monophasic flow, and the right dorsalis pedis artery exhibits monophasic flow, suggestive of proximal obstruction.  Preliminary results discussed with Dr. Roda Shutters.  09/04/2018 11:46 AM Gertie Fey, MHA, RVT, RDCS, RDMS

## 2018-09-05 ENCOUNTER — Other Ambulatory Visit: Payer: Self-pay

## 2018-09-05 DIAGNOSIS — R451 Restlessness and agitation: Secondary | ICD-10-CM

## 2018-09-05 DIAGNOSIS — I5023 Acute on chronic systolic (congestive) heart failure: Secondary | ICD-10-CM

## 2018-09-05 LAB — GLUCOSE, CAPILLARY
Glucose-Capillary: 134 mg/dL — ABNORMAL HIGH (ref 70–99)
Glucose-Capillary: 131 mg/dL — ABNORMAL HIGH (ref 70–99)
Glucose-Capillary: 133 mg/dL — ABNORMAL HIGH (ref 70–99)
Glucose-Capillary: 137 mg/dL — ABNORMAL HIGH (ref 70–99)
Glucose-Capillary: 143 mg/dL — ABNORMAL HIGH (ref 70–99)
Glucose-Capillary: 145 mg/dL — ABNORMAL HIGH (ref 70–99)
Glucose-Capillary: 150 mg/dL — ABNORMAL HIGH (ref 70–99)
Glucose-Capillary: 175 mg/dL — ABNORMAL HIGH (ref 70–99)
Glucose-Capillary: 138 mg/dL — ABNORMAL HIGH (ref 70–99)
Glucose-Capillary: 116 mg/dL — ABNORMAL HIGH (ref 70–99)

## 2018-09-05 LAB — BASIC METABOLIC PANEL
Anion gap: 12 (ref 5–15)
Anion gap: 11 (ref 5–15)
BUN: 11 mg/dL (ref 8–23)
BUN: 12 mg/dL (ref 8–23)
CO2: 31 mmol/L (ref 22–32)
CO2: 34 mmol/L — ABNORMAL HIGH (ref 22–32)
Calcium: 8.4 mg/dL — ABNORMAL LOW (ref 8.9–10.3)
Calcium: 8.5 mg/dL — ABNORMAL LOW (ref 8.9–10.3)
Chloride: 101 mmol/L (ref 98–111)
Chloride: 99 mmol/L (ref 98–111)
Creatinine, Ser: 1.1 mg/dL (ref 0.61–1.24)
Creatinine, Ser: 1.18 mg/dL (ref 0.61–1.24)
GFR calc Af Amer: >60 mL/min (ref >60)
GFR calc Af Amer: >60 mL/min (ref >60)
GFR calc non Af Amer: >60 mL/min (ref >60)
GFR calc non Af Amer: 59 mL/min — ABNORMAL LOW (ref >60)
Glucose, Bld: 156 mg/dL — ABNORMAL HIGH (ref 70–99)
Glucose, Bld: 168 mg/dL — ABNORMAL HIGH (ref 70–99)
Potassium: 3.3 mmol/L — ABNORMAL LOW (ref 3.5–5.1)
Potassium: 3.3 mmol/L — ABNORMAL LOW (ref 3.5–5.1)
Sodium: 144 mmol/L (ref 135–145)
Sodium: 144 mmol/L (ref 135–145)

## 2018-09-05 LAB — CBC
HCT: 33.5 % — ABNORMAL LOW (ref 39.0–52.0)
Hemoglobin: 10.7 g/dL — ABNORMAL LOW (ref 13.0–17.0)
MCH: 32.1 pg (ref 26.0–34.0)
MCHC: 31.9 g/dL (ref 30.0–36.0)
MCV: 100.6 fL — ABNORMAL HIGH (ref 80.0–100.0)
Platelets: 500 10*3/uL — ABNORMAL HIGH (ref 150–400)
RBC: 3.33 MIL/uL — ABNORMAL LOW (ref 4.22–5.81)
RDW: 15 % (ref 11.5–15.5)
WBC: 18.6 10*3/uL — ABNORMAL HIGH (ref 4.0–10.5)
nRBC: 0.9 % — ABNORMAL HIGH (ref 0.0–0.2)

## 2018-09-05 LAB — MAGNESIUM
Magnesium: 1.9 mg/dL (ref 1.7–2.4)
Magnesium: 1.8 mg/dL (ref 1.7–2.4)

## 2018-09-05 MED ORDER — GLUCERNA SHAKE PO LIQD
237.0000 mL | Freq: Two times a day (BID) | ORAL | Status: DC
  Administered 2018-09-05 – 2018-09-15 (×16): 237 mL via ORAL
  Administered 2018-09-16: 10:00:00 via ORAL

## 2018-09-05 MED ORDER — QUETIAPINE FUMARATE 25 MG PO TABS
12.5000 mg | ORAL_TABLET | Freq: Two times a day (BID) | ORAL | Status: DC
  Administered 2018-09-05 – 2018-09-06 (×3): 12.5 mg via ORAL
  Filled 2018-09-05 (×4): qty 1

## 2018-09-05 MED ORDER — MAGNESIUM SULFATE 2 GM/50ML IV SOLN
2.0000 g | Freq: Once | INTRAVENOUS | Status: AC
  Administered 2018-09-05: 2 g via INTRAVENOUS
  Filled 2018-09-05: qty 50

## 2018-09-05 MED ORDER — ORAL CARE MOUTH RINSE
15.0000 mL | Freq: Two times a day (BID) | OROMUCOSAL | Status: DC
  Administered 2018-09-05 – 2018-09-17 (×25): 15 mL via OROMUCOSAL

## 2018-09-05 MED ORDER — POTASSIUM CHLORIDE CRYS ER 20 MEQ PO TBCR
40.0000 meq | EXTENDED_RELEASE_TABLET | ORAL | Status: AC
  Administered 2018-09-05 (×2): 40 meq via ORAL
  Filled 2018-09-05 (×2): qty 2

## 2018-09-05 NOTE — Progress Notes (Signed)
STROKE TEAM PROGRESS NOTE   SUBJECTIVE (INTERVAL HISTORY) His RN and son are at the bedside. While pt family is away, pt will has agitation episodes largely due to uncomfortable position and aphasia. Once son back in room, he is more calm and talking well with son.   Overnight, he had a code stroke call due to agitation too like above and code stroke was cancelled.   OBJECTIVE Temp:  [98.1 F (36.7 C)-99.7 F (37.6 C)] 98.3 F (36.8 C) (01/03 1458) Pulse Rate:  [70-121] 70 (01/03 1724) Cardiac Rhythm: Atrial fibrillation (01/03 0800) Resp:  [18-35] 20 (01/03 1724) BP: (100-139)/(57-92) 135/57 (01/03 1724) SpO2:  [85 %-100 %] 100 % (01/03 1724)  Recent Labs  Lab 09/04/18 1644 09/04/18 2112 09/05/18 0706 09/05/18 1147 09/05/18 1703  GLUCAP 147* 163* 134* 131* 138*   Recent Labs  Lab 08/29/18 1905  09/03/18 0206 09/03/18 1626 09/04/18 0530 09/04/18 2157 09/05/18 0308  NA  --    < > 146* 144 145 144 144  K  --    < > 2.8* 2.8* 2.8* 3.3* 3.3*  CL  --    < > 105 105 103 101 99  CO2  --    < > 26 26 31 31  34*  GLUCOSE  --    < > 87 155* 144* 156* 168*  BUN  --    < > 19 18 14 11 12   CREATININE  --    < > 1.23 1.13 1.07 1.10 1.18  CALCIUM  --    < > 8.6* 8.1* 8.0* 8.4* 8.5*  MG 1.7  --  1.8  --   --  1.9 1.8  PHOS 2.2*  --   --   --   --   --   --    < > = values in this interval not displayed.   No results for input(s): AST, ALT, ALKPHOS, BILITOT, PROT, ALBUMIN in the last 168 hours. Recent Labs  Lab 09/01/18 0046 09/02/18 0541 09/03/18 0206 09/04/18 0530 09/05/18 0308  WBC 13.9* 16.6* 19.2* 14.7* 18.6*  HGB 9.3* 9.4* 9.9* 9.3* 10.7*  HCT 29.9* 29.3* 31.3* 30.2* 33.5*  MCV 102.4* 101.7* 102.3* 101.0* 100.6*  PLT 269 322 363 377 500*   Recent Labs  Lab 08/31/18 1027  TROPONINI 12.47*   No results for input(s): LABPROT, INR in the last 72 hours. No results for input(s): COLORURINE, LABSPEC, PHURINE, GLUCOSEU, HGBUR, BILIRUBINUR, KETONESUR, PROTEINUR,  UROBILINOGEN, NITRITE, LEUKOCYTESUR in the last 72 hours.  Invalid input(s): APPERANCEUR     Component Value Date/Time   CHOL 138 08/28/2018 0600   TRIG 171 (H) 08/28/2018 0921   HDL 24 (L) 08/28/2018 0600   CHOLHDL 5.8 08/28/2018 0600   VLDL 33 08/28/2018 0600   LDLCALC 81 08/28/2018 0600   Lab Results  Component Value Date   HGBA1C 7.1 (H) 08/28/2018   No results found for: LABOPIA, COCAINSCRNUR, LABBENZ, AMPHETMU, THCU, LABBARB  No results for input(s): ETH in the last 168 hours.  I have personally reviewed the radiological images below and agree with the radiology interpretations.  Ct Angio Head W Or Wo Contrast  Result Date: 08/27/2018 CLINICAL DATA:  Acute presentation with speech disturbance. EXAM: CT ANGIOGRAPHY HEAD AND NECK TECHNIQUE: Multidetector CT imaging of the head and neck was performed using the standard protocol during bolus administration of intravenous contrast. Multiplanar CT image reconstructions and MIPs were obtained to evaluate the vascular anatomy. Carotid stenosis measurements (when applicable) are obtained utilizing NASCET  criteria, using the distal internal carotid diameter as the denominator. CONTRAST:  75mL ISOVUE-370 IOPAMIDOL (ISOVUE-370) INJECTION 76% COMPARISON:  CT same day. FINDINGS: CTA NECK FINDINGS Aortic arch: Aortic atherosclerosis.  No aneurysm or dissection. Right carotid system: Common carotid artery shows some atherosclerotic plaque but is widely patent to the bifurcation. There is soft and calcified plaque affecting the carotid bifurcation and ICA bulb region. There is severe stenosis in the distal bulb region with luminal diameter of 1 mm. The vessel is quite tortuous beyond that but widely patent to the upper cervical region, where there is soft and calcified plaque resulting in minimal diameter of 3.5 mm. Compared to an expected diameter of 5 mm, stenosis in the bulb is 80% or greater. Stenosis in the upper cervical region is 30%. Left  carotid system: Common carotid artery shows atherosclerotic plaque but is sufficiently patent to the bifurcation region. There is calcified and soft plaque at the carotid bifurcation and ICA bulb. There is left internal carotid artery occlusion at the distal bulb. 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 The left vertebral artery shows 50% stenosis at its origin but is sufficiently patent beyond that through the cervical region to the foramen magnum. Skeleton: Mid cervical spondylosis. Other neck: No mass or lymphadenopathy. Upper chest: Interstitial prominence which could go along with fluid overload or mild edema. Review of the MIP images confirms the above findings CTA HEAD FINDINGS Anterior circulation: Left internal carotid artery is occluded without antegrade flow through the skull base. Right internal carotid artery shows atherosclerotic disease in the carotid siphon region with stenosis estimated at 50-70%. The anterior and middle cerebral vessels are patent. There is atherosclerotic irregularity of the M1 segment with stenosis of 50-70%. No acute vessel occlusion is identified. There is a chronic punctate calcified embolus in 1 of the insular branches, but this was present in 2014. On the left, there is reconstituted flow in the distal siphon. Severe stenosis of the supraclinoid ICA, 80% or greater. Flow is present in the anterior and middle cerebral vessels, secondary to reconstituted flow and flow through communicating arteries. There is 50% stenosis in the M1 segment. I do not see any occluded or missing large or medium vessels in the MCA territory. Posterior circulation: Right vertebral artery shows no antegrade flow at the foramen magnum. There is retrograde flow in the distal right vertebral. Left vertebral artery is patent at the foramen magnum. There is atherosclerotic disease of the V4 segment with stenosis estimated at 70%. There are serial stenoses. The vessel  does show flow to the basilar, which shows atherosclerotic irregularity but is patent. Flow is present in the superior cerebellar and posterior cerebral arteries. Venous sinuses: Patent and normal. Anatomic variants: None significant. Delayed phase: No abnormal enhancement. 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 Severe irregular atherosclerotic disease of the right carotid bifurcation with stenosis of 80% in the bulb region and 30% in the distal ICA. 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 50% stenosis of the left vertebral artery origin. Severe atherosclerotic disease in both carotid siphon regions. Right siphon stenosis estimated at 50-70%. Reconstituted flow in the distal left siphon and supraclinoid region. 80% supraclinoid stenosis. Flow in both anterior and middle cerebral artery territories. Stenosis of both M1 segments estimated at 50-70%. I do not identify any large or medium vessel occlusion acutely within the MCA branch vessels. Severely disease left V4 segment  with serial stenoses proximal to the basilar estimated at 70%. Basilar atherosclerotic irregularity without flow limiting stenosis. Posterior circulation branch vessels do show flow, including right PICA which is supplied by retrograde flow in the distal right vertebral artery. Electronically Signed   By: Paulina Fusi M.D.   On: 08/27/2018 13:14   Dg Chest 2 View  Result Date: 08/27/2018 CLINICAL DATA:  Chest pain and shortness of breath.  Cardiomyopathy. EXAM: CHEST - 2 VIEW COMPARISON:  03/07/2013 FINDINGS: Mild cardiomegaly. Aortic atherosclerosis. Diffuse interstitial infiltrates, consistent with pulmonary edema. Mild subsegmental atelectasis also seen in the left upper lobe. No evidence of pulmonary consolidation or significant pleural effusion. IMPRESSION: Mild cardiomegaly and diffuse interstitial edema, consistent with congestive heart  failure. Electronically Signed   By: Myles Rosenthal M.D.   On: 08/27/2018 12:02   Ct Angio Neck W Or Wo Contrast  Result Date: 08/27/2018 CLINICAL DATA:  Acute presentation with speech disturbance. EXAM: CT ANGIOGRAPHY HEAD AND NECK TECHNIQUE: Multidetector CT imaging of the head and neck was performed using the standard protocol during bolus administration of intravenous contrast. Multiplanar CT image reconstructions and MIPs were obtained to evaluate the vascular anatomy. Carotid stenosis measurements (when applicable) are obtained utilizing NASCET criteria, using the distal internal carotid diameter as the denominator. CONTRAST:  75mL ISOVUE-370 IOPAMIDOL (ISOVUE-370) INJECTION 76% COMPARISON:  CT same day. FINDINGS: CTA NECK FINDINGS Aortic arch: Aortic atherosclerosis.  No aneurysm or dissection. Right carotid system: Common carotid artery shows some atherosclerotic plaque but is widely patent to the bifurcation. There is soft and calcified plaque affecting the carotid bifurcation and ICA bulb region. There is severe stenosis in the distal bulb region with luminal diameter of 1 mm. The vessel is quite tortuous beyond that but widely patent to the upper cervical region, where there is soft and calcified plaque resulting in minimal diameter of 3.5 mm. Compared to an expected diameter of 5 mm, stenosis in the bulb is 80% or greater. Stenosis in the upper cervical region is 30%. Left carotid system: Common carotid artery shows atherosclerotic plaque but is sufficiently patent to the bifurcation region. There is calcified and soft plaque at the carotid bifurcation and ICA bulb. There is left internal carotid artery occlusion at the distal bulb. 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 The left vertebral artery shows 50% stenosis at its origin but is sufficiently patent beyond that through the cervical region to the foramen magnum. Skeleton: Mid cervical spondylosis. Other neck: No  mass or lymphadenopathy. Upper chest: Interstitial prominence which could go along with fluid overload or mild edema. Review of the MIP images confirms the above findings CTA HEAD FINDINGS Anterior circulation: Left internal carotid artery is occluded without antegrade flow through the skull base. Right internal carotid artery shows atherosclerotic disease in the carotid siphon region with stenosis estimated at 50-70%. The anterior and middle cerebral vessels are patent. There is atherosclerotic irregularity of the M1 segment with stenosis of 50-70%. No acute vessel occlusion is identified. There is a chronic punctate calcified embolus in 1 of the insular branches, but this was present in 2014. On the left, there is reconstituted flow in the distal siphon. Severe stenosis of the supraclinoid ICA, 80% or greater. Flow is present in the anterior and middle cerebral vessels, secondary to reconstituted flow and flow through communicating arteries. There is 50% stenosis in the M1 segment. I do not see any occluded or missing large or medium vessels in the MCA territory. Posterior circulation:  Right vertebral artery shows no antegrade flow at the foramen magnum. There is retrograde flow in the distal right vertebral. Left vertebral artery is patent at the foramen magnum. There is atherosclerotic disease of the V4 segment with stenosis estimated at 70%. There are serial stenoses. The vessel does show flow to the basilar, which shows atherosclerotic irregularity but is patent. Flow is present in the superior cerebellar and posterior cerebral arteries. Venous sinuses: Patent and normal. Anatomic variants: None significant. Delayed phase: No abnormal enhancement. 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 Severe irregular atherosclerotic disease of the right carotid bifurcation with stenosis of 80% in the bulb region and 30% in the distal ICA.  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 50% stenosis of the left vertebral artery origin. Severe atherosclerotic disease in both carotid siphon regions. Right siphon stenosis estimated at 50-70%. Reconstituted flow in the distal left siphon and supraclinoid region. 80% supraclinoid stenosis. Flow in both anterior and middle cerebral artery territories. Stenosis of both M1 segments estimated at 50-70%. I do not identify any large or medium vessel occlusion acutely within the MCA branch vessels. Severely disease left V4 segment with serial stenoses proximal to the basilar estimated at 70%. Basilar atherosclerotic irregularity without flow limiting stenosis. Posterior circulation branch vessels do show flow, including right PICA which is supplied by retrograde flow in the distal right vertebral artery. Electronically Signed   By: Paulina Fusi M.D.   On: 08/27/2018 13:14   Mr Brain Wo Contrast  Result Date: 08/28/2018 CLINICAL DATA:  Follow-up examination for acute stroke, right lower extremity weakness. Known left ICA occlusion. EXAM: MRI HEAD WITHOUT CONTRAST TECHNIQUE: Multiplanar, multiecho pulse sequences of the brain and surrounding structures were obtained without intravenous contrast. COMPARISON:  Prior CTs from 08/27/2018. FINDINGS: Brain: Generalized age-related cerebral atrophy. Patchy and confluent T2/FLAIR hyperintensity within the periventricular and deep white matter both cerebral hemispheres most consistent with chronic microvascular ischemic disease, moderate nature. Scatter remote cortical infarcts involving the posterior left frontoparietal region, right parietal lobe, and right frontal lobe are seen. Small remote right cerebellar infarct. Remote lacunar infarct present at the left caudate head. Scattered chronic hemosiderin staining seen about several of these infarcts. Scattered multifocal foci of restricted diffusion involving the cortical subcortical left frontal, parietal, and  temporal occipital regions, compatible with acute ischemic left MCA territory infarcts. Largest area of infarction seen at the high left frontal parietal region and measures 2.5 cm (series 3, image 48). Possible faint petechial hemorrhage about a few of these infarcts without frank hemorrhagic transformation or significant mass effect. Findings likely embolic in nature. No mass lesion, midline shift or mass effect. Mild diffuse ventricular prominence related global parenchymal volume loss without hydrocephalus. No extra-axial fluid collection. Pituitary gland within normal limits. Vascular: Abnormal flow void within the left ICA to its cavernous segment, compatible with previously identified left ICA occlusion. Major intracranial vascular flow voids otherwise maintained at the skull base. Skull and upper cervical spine: Craniocervical junction within normal limits. Upper cervical spine normal. Bone marrow signal intensity within normal limits. No scalp soft tissue abnormality. Sinuses/Orbits: Patient status post bilateral ocular lens replacement. Globes and orbital soft tissues demonstrate no acute finding. Scattered mucosal thickening seen throughout the paranasal sinuses with superimposed scattered air-fluid levels. Fluid seen within the nasopharynx. Patient likely intubated. Trace bilateral mastoid effusions noted. Inner ear structures normal. Other: None. IMPRESSION: 1. 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 Possible faint scattered associated petechial hemorrhage without frank hemorrhagic transformation or significant mass effect. 2. Abnormal flow void within the left ICA to its cavernous segment, consistent with known left ICA occlusion. 3. 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 4. Moderately advanced cerebral atrophy with chronic small vessel ischemic disease.  Electronically Signed   By: Rise Mu M.D.   On: 08/28/2018 15:33   Ct Abdomen Pelvis W Contrast  Result Date: 08/29/2018 CLINICAL DATA:  Abdominal distention.  Bloating. EXAM: CT ABDOMEN AND PELVIS WITH CONTRAST TECHNIQUE: Multidetector CT imaging of the abdomen and pelvis was performed using the standard protocol following bolus administration of intravenous contrast. CONTRAST:  OMNIPAQUE IOHEXOL 300 MG/ML  SOLN COMPARISON:  Abdominal radiograph dated 08/28/2018 and abdominal ultrasound dated 02/28/2016 FINDINGS: Lower chest: There are bilateral consolidative infiltrates in the lower lobes as well as minimal bilateral pleural effusions. Aortic atherosclerosis. Coronary artery calcifications. Hepatobiliary: Liver parenchyma is normal. No biliary ductal dilatation. Vicarious excretion of contrast in the normal appearing gallbladder. Pancreas: Unremarkable. No pancreatic ductal dilatation or surrounding inflammatory changes. Spleen: Normal in size without focal abnormality. Adrenals/Urinary Tract: Adrenal glands are normal. Normal right kidney. Large chronic cyst in the lower pole of the left kidney with some calcification in the wall. Maximum diameter of the cyst is approximately 9 cm. Stomach/Bowel: NG tube tip in the body of the stomach. There are few diverticula in the proximal sigmoid portion of the colon. There is moderate air in the colon without significant distention. Small bowel appears normal. Fluid and air in the stomach. Appendix has been removed. Vascular/Lymphatic: There is a poorly defined hematoma in the right inguinal region in the panniculus. This measures approximately 12 x 5 x 2 cm and is probably related to recent angiography. There are several small lymph nodes in the right inguinal region, the largest being 15 mm in diameter. Extensive aortic atherosclerosis. No adenopathy. Reproductive: Dense calcification in the normal-sized prostate gland. Other: There is a small  amount of ascites in the abdomen, nonspecific. Small periumbilical hernia containing only fat. Edema in the subcutaneous fat of both flanks, right greater than left. Slight edema in the panniculus and in the lateral aspect of both thighs. Musculoskeletal: No acute abnormalities. Multilevel degenerative disc disease in the thoracic spine and lumbar spine. IMPRESSION: 1. Small hematoma in the right inguinal region in the panniculus, probably secondary to the recent angiography. 2. Bilateral consolidative infiltrates in the posterior aspect of both lower lobes with tiny adjacent pleural effusions. 3. Small amount of nonspecific ascites. 4. Slightly prominent gas in the bowel without evidence of bowel obstruction. This probably represents an ileus. Electronically Signed   By: Francene Boyers M.D.   On: 08/29/2018 17:03   Ir Ct Head Ltd  Result Date: 08/31/2018 CLINICAL DATA:  New onset right-sided weakness with mild word-finding difficulties. 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 EXAM: IR ANGIO INTRA EXTRACRAN SEL COM CAROTID INNOMINATE UNI RIGHT MOD SED; IR PERCUTANEOUS ART THORMBECTOMY/INFUSION INTRACRANIAL INCLUDE DIAG ANGIO; IR ANGIO VERTEBRAL SEL SUBCLAVIAN INNOMINATE UNI LEFT MOD SED; IR CT HEAD LIMITED COMPARISON:  CT angiogram of the head and neck of 08/27/2018 MEDICATIONS: Heparin 3,000 units IV; . Ancef 2 g IV antibiotic was administered within 1 hour of the procedure  ANESTHESIA/SEDATION: General anesthesia CONTRAST:  Isovue 300 approximately 100 mL FLUOROSCOPY TIME:  Fluoroscopy Time: 60 minutes 0 seconds (4327 mGy). COMPLICATIONS: None immediate. TECHNIQUE: Informed written consent was obtained from the patient after a thorough discussion of the procedural risks, benefits and alternatives. All questions were addressed. Maximal Sterile  Barrier Technique was utilized including caps, mask, sterile gowns, sterile gloves, sterile drape, hand hygiene and skin antiseptic. A timeout was performed prior to the initiation of the procedure. The right groin was prepped and draped in the usual sterile fashion. Thereafter using modified Seldinger technique, transfemoral access into the right common femoral artery was obtained without difficulty. Over a 0.035 inch guidewire, a 5 French Pinnacle sheath was inserted. Through this, and also over 0.035 inch guidewire, a 5 [place] JB 1 catheter was advanced to the aortic arch region and selectively positioned in the right common carotid artery, the innominate artery , the left common carotid artery and the left vertebral artery. FINDINGS: The left common carotid artery tear g demonstrates a moderate tortuosity of the proximal left common carotid artery The left external carotid artery proximally has a mild to moderate stenosis. Is branches are otherwise normally opacified The left internal carotid artery at the bulb would demonstrates the complete angiographic occlusion with no evidence of a string sign on the delayed arterial images. The innominate artery arteriogram demonstrates patency of the right subclavian artery and the right common carotid artery proximally There is no angiographic evidence of the right vertebral artery The right common carotid bifurcation demonstrates the right external carotid artery and its major branches to be widely patent The right internal carotid artery just distal to the bulb has a severe 90% plus stenosis. Distal to this there is a U-shaped configuration of the right internal carotid artery. More distally the vessel is normal caliber The petrous the cavernous segments and supraclinoid segments are widely patent There is mild atherosclerotic irregularity involving the proximal cavernous, and distal cavernous portions of the right internal carotid artery. Right posterior communicating  artery is seen opacifying the right posterior cerebral artery and the distal basilar artery. The right middle cerebral artery has approximately 30 40% stenosis of the right M1 segment The trifurcation branches appear to be widely patent into the capillary and venous phases The right anterior cerebral artery A1 segment and distally demonstrate wide patency is into the capillary and venous phases There is simultaneous cross-filling via the anterior communicating artery of the left anterior cerebral artery A2 segment and distally. Also demonstrated is opacification of the left anterior cerebral A1 segment, with flow noted into the left middle cerebral artery distribution. Unopacified blood is seen in the left middle cerebral artery from the collateral circulation. 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 More distally the vessel is seen to opacify to the cranial skull base There is severe focal of focal stenosis of the left vertebrobasilar junction just distal to the origin of the left posterior-inferior cerebellar artery Additionally there is a approximately 70% stenosis of the proximal basilar artery More distally the basilar artery demonstrates mild caliber irregularity. There is a opacification of the left posterior cerebral artery the superior cerebellar arteries left greater than right, and the anterior-inferior cerebellar arteries Retrograde opacification of the right vertebrobasilar junction to  the level of the right posterior-inferior cerebellar arteries is noted. Caliber irregularity of the right posterior-inferior cerebellar artery suggests intracranial arteriosclerosis. Left internal carotid artery occlusion or  revascularization attempt The diagnostic JB 1 catheter left common carotid artery was then exchanged over a 0.035 inch 300 cm a 7 French Pinnacle sheath in the right groin. A diagnostic JB 1 catheter were then advanced over the rise exchange guidewire to the descending thoracic aorta. The 7 French Pinnacle sheath was then exchanged over a 0.035 inch 100 cm Amplatz stiff guidewire for a a 8 French 55 cm Brite tip neurovascular sheath using biplane roadmap technique and constant fluoroscopic guidance. Good good aspiration was obtained from the side port of the neurovascular sheath. This was then connected to continuous heparinized saline infusion. The embolus guidewire was then removed. Through the 8 French neurovascular sheath, over a 0.035 inch roadrunner guidewire, a 5 5 [place] JB 1 catheter was advanced to the recover region and select position in the left common carotid artery. Using biplane technique and constant fluoroscopic guidance, over the of the 5 inch roadrunner guidewire, the 5 [place] JB 1 catheter was advanced to the left external carotid artery and exchanged over a 0.035 inch 300 cm rise exchange guidewire for an 8 French 85 cm flow gap balloon guide catheter which had been prepped with 50% contrast and 50% heparinized saline infusion The guidewire was removed. Good aspiration obtained from the hub of the 8 [place] Flo guide catheter. Gentle contrast injection demonstrated no evidence of spasms dissections or of intraluminal filling defects. At this time, in a coaxial manner and with constant heparinized saline infusion, a tree approached 1 microcatheter was advanced over a point 0.014 inch soft tip synchro micro guidewire to the distal end of the Flo guide catheter The Flood guide catheter was advanced to just inside the left internal carotid artery bulb. Thereafter, multiple attempts were made to advance the microcatheter over the micro guidewire first with Ace synchro soft and then a regular synchro  guidewire. In 016 headliner double angled micro guidewire was also utilized in order to access the occluded left internal carotid artery. It was noted that there was a severe tortuosity at the junction of the proximal and the middle 1/3 of the left internal carotid artery Following multiple attempts it was decided to stop. A control was then performed through the [facility] guide catheter in the left common carotid artery which revealed brisk cross-filling of the left internal carotid artery distal cavernous and supraclinoid segments from the left external carotid artery branches via the ipsilateral ophthalmic artery Flow is noted into the left middle cerebral artery as well Also noted was a retrograde opacification from the left posterior communicating artery into the supraclinoid left ICA and then middle cerebral artery An earlier diagnostic catheter arteriogram of the right common carotid artery he had revealed partial cross-filling via the anterior communicating artery from the right internal carotid artery Given the the collateral circulation, it was decided to stop the procedure. Throughout the procedure, the patient's blood pressure and neurological status remained stable No evidence of hemodynamic instability was noted No gross mass-effect or move filling defects or extravasation was seen Prior to the attempted revascularization of the left internal carotid artery occlusion, the patient was given 180 mg of Brilinta, and 81 mg of aspirin orally the an orogastric tube. Also the patient was loaded with 3000 units IV heparin in order to facilitate revascularization. The the 8 [place] flu guide catheter was then retrieved and  removed. The 8 [place] Brite tip neurovascular sheath was then exchanged over a J-tip guidewire for a in 8 French 35 cm Brite tip neurovascular sheath. This was then connected to continuous heparinized saline infusion An arteriogram performed through this 3 5 cm 8 French Brite tip neurovascular  sheath in the abdominal aorta reveal excellent flow through the common iliacs, the external and internal iliacs, and also the visualized portions of the common femoral arteries below the level of the inguinal ligaments. At the end of procedure, a CT of the brain revealed no evidence of gross intracranial hemorrhage,, or mass effect or midline shift. The patient was left intubated on account of his wound at the time of his intubation The right groin appeared soft without evidence of a hematoma. Distal pulses in the dorsalis pedis, and posterior regions remained dopplerable bilaterally. The patient was then transferred to the neuro ICU for further post stroke management. IMPRESSION: . 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 Approximately 90% stenosis of the dominant left vertebrobasilar junction distal to the origin of the left posterior-inferior cerebellar artery, and of approximately 70% of the proximal basilar artery. 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 PLAN: Follow-up in the clinic 1 month post discharge Electronically Signed   By: Julieanne Cotton M.D.   On: 08/28/2018 16:08   Ct Cerebral Perfusion W Contrast  Result Date: 08/27/2018 CLINICAL DATA:  Right-sided weakness onset this morning. EXAM: CT PERFUSION BRAIN TECHNIQUE: Multiphase CT imaging of the brain was performed following IV bolus contrast injection. Subsequent parametric perfusion maps were calculated using RAPID software. CONTRAST:  40mL ISOVUE-370 IOPAMIDOL (ISOVUE-370) INJECTION 76% COMPARISON:  CT a head today FINDINGS: CT Brain Perfusion Findings: CBF (<30%) Volume: 0mL Perfusion (Tmax>6.0s) volume: Mismatch Volume: ASPECTS on noncontrast CT Head today, not calculated due to extensive chronic ischemic change. No definite acute infarct on CT. Infarct  Core: 0 mL Infarction Location:Delayed perfusion throughout the left MCA territory with apparent sparing of the basal ganglia. There is occlusion of the left internal carotid artery on CTA, age indeterminate. Left middle cerebral artery is diseased and may have delayed perfusion due to collateral circulation and stenosis. IMPRESSION: 155 mL of delayed perfusion in the left MCA territory. No core infarction. Delayed perfusion left MCA territory may be due to severe intracranial atherosclerotic disease as well as occlusion of the left internal carotid artery. Age of the internal carotid artery occlusion is indeterminate. It is possible this delayed perfusion is a chronic or possibly acute finding. Electronically Signed   By: Marlan Palau M.D.   On: 08/27/2018 16:12   Dg Chest Port 1 View  Result Date: 09/03/2018 CLINICAL DATA:  Shortness of breath. EXAM: PORTABLE CHEST 1 VIEW COMPARISON:  Radiograph September 02, 2018. FINDINGS: Stable cardiomegaly. Atherosclerosis of thoracic aorta is noted. No pneumothorax is noted. Right internal jugular catheter is unchanged in position. Stable bilateral pulmonary edema is noted with minimal pleural effusions. Bony thorax is unremarkable. IMPRESSION: Stable cardiomegaly with bilateral pulmonary edema and minimal pleural effusions. Aortic Atherosclerosis (ICD10-I70.0). Electronically Signed   By: Lupita Raider, M.D.   On: 09/03/2018 12:33   Dg Chest Port 1 View  Result Date: 09/02/2018 CLINICAL DATA:  Acute respiratory distress. EXAM: PORTABLE CHEST 1 VIEW COMPARISON:  One-view chest x-ray 09/01/2018 FINDINGS: The heart is enlarged. Patient has been extubated. NG tube was removed. A right IJ line  remains. Atherosclerotic changes are present at the aortic arch. Diffuse interstitial edema is stable. Bibasilar airspace disease is worse on the left. IMPRESSION: 1. Interval extubation and removal of NG tube. 2. Stable cardiomegaly and edema consistent with congestive heart  failure. 3. Aortic atherosclerosis. Electronically Signed   By: Marin Roberts M.D.   On: 09/02/2018 07:29   Dg Chest Port 1 View  Result Date: 09/01/2018 CLINICAL DATA:  Respiratory failure. EXAM: PORTABLE CHEST 1 VIEW COMPARISON:  08/31/2018 FINDINGS: Endotracheal tube terminates 4.2 cm above the carina. Right jugular catheter terminates over the mid SVC. Enteric tube courses into the left upper abdomen. The cardiac silhouette remains enlarged. Patchy airspace opacities throughout both lungs have not significantly changed. IMPRESSION: Bilateral airspace disease without significant interval change. Electronically Signed   By: Sebastian Ache M.D.   On: 09/01/2018 06:54   Dg Chest Port 1 View  Result Date: 08/31/2018 CLINICAL DATA:  CXR for Respiratory failure. Hx of HTN, stroke, AAA, Diabetes, and Cardiomyopathy. EXAM: PORTABLE CHEST 1 VIEW COMPARISON:  08/30/2018 FINDINGS: Endotracheal tube is in place, tip 3.8 centimeters above the carina. Nasogastric tube is in place, tip beyond the gastroesophageal junction and off the image. A RIGHT IJ central line tip overlies the superior vena cava. Patient is rotated towards the LEFT. The heart is enlarged. There is persistent airspace filling opacity throughout the RIGHT lung, slightly improved. There is significant opacity in the LEFT lung base, consistent with atelectasis or consolidation and increased since 08/27/2018. IMPRESSION: 1. Slight improvement in aeration of the RIGHT lung. 2. Increased opacity in the LEFT lung base. Electronically Signed   By: Norva Pavlov M.D.   On: 08/31/2018 09:52   Dg Chest Port 1 View  Result Date: 08/30/2018 CLINICAL DATA:  Respiratory failure. Cardiomyopathy. EXAM: PORTABLE CHEST 1 VIEW COMPARISON:  08/29/2018 and 08/27/2018 FINDINGS: Endotracheal tube and NG tube and central line appear unchanged and in good position. There has been progression of the bilateral diffuse pulmonary infiltrates, particularly in the  right upper lung zone of the left lung base. Heart size and pulmonary vascularity are normal. No discrete effusions. Aortic atherosclerosis. No acute bone abnormality. IMPRESSION: Progressive bilateral pulmonary infiltrates. Aortic Atherosclerosis (ICD10-I70.0). Electronically Signed   By: Francene Boyers M.D.   On: 08/30/2018 08:48   Dg Chest Port 1 View  Result Date: 08/29/2018 CLINICAL DATA:  Central line placement EXAM: PORTABLE CHEST 1 VIEW COMPARISON:  08/29/2018 FINDINGS: Right central line is been placed with the tip in the SVC. No pneumothorax. Endotracheal tube and NG tube are unchanged. Cardiomegaly with vascular congestion and mild pulmonary edema, stable. IMPRESSION: Right central line tip in the SVC. No pneumothorax. Continued mild CHF. Electronically Signed   By: Charlett Nose M.D.   On: 08/29/2018 11:58   Dg Chest Port 1 View  Result Date: 08/29/2018 CLINICAL DATA:  PT presented to [facility] ED on 08/27/18 with complaints of right lower extremity weakness, dyspnea and chest pressure. Hx of stroke, Peripheral vascualar disease, cardiomyopathy, and A-fib. EXAM: PORTABLE CHEST 1 VIEW COMPARISON:  08/27/2018 FINDINGS: Mild enlargement of the cardiac silhouette, stable. No mediastinal or hilar masses. There is central vascular congestion. Mild hazy opacity noted in the perihilar regions and medial lung bases bilaterally. No convincing pleural effusion and no pneumothorax. Endotracheal tube tip projects 3.8 cm above the Carina. Nasogastric tube passes below the diaphragm well into the stomach. IMPRESSION: 1. Findings are consistent with mild congestive heart failure with evidence of slight improvement when compared to 08/27/2018. No  new abnormalities. 2. Support apparatus is well positioned. Electronically Signed   By: Amie Portland M.D.   On: 08/29/2018 07:26   Dg Chest Port 1 View  Result Date: 08/28/2018 CLINICAL DATA:  Shortness of Breath EXAM: PORTABLE CHEST 1 VIEW COMPARISON:  08/27/2018  FINDINGS: Cardiac shadow is enlarged in size. Aortic calcifications are noted. Endotracheal tube is noted at the level of the carina. This should be withdrawn 2-3 cm. Nasogastric catheter is noted within the stomach. Lungs are well aerated bilaterally with mild interstitial edema similar to that seen on the prior exam. IMPRESSION: Interval intubation with the endotracheal tube at the level of the carina. This should be withdrawn 2-3 cm. Stable edema bilaterally. These results will be called to the ordering clinician or representative by the Radiologist Assistant, and communication documented in the PACS or zVision Dashboard. Electronically Signed   By: Alcide Clever M.D.   On: 08/28/2018 00:19   Dg Abd Portable 1v  Result Date: 08/28/2018 CLINICAL DATA:  Orogastric tube placement. EXAM: PORTABLE ABDOMEN - 1 VIEW COMPARISON:  None. FINDINGS: The patient's enteric tube is seen ending overlying the body of the stomach, with the side port about the fundus of the stomach. The visualized bowel gas pattern is grossly unremarkable. A small left pleural effusion is noted. Increased interstitial markings raise concern for pulmonary edema. Vascular congestion is noted. The patient's endotracheal tube is seen ending 2-3 cm above the carina. No acute osseous abnormalities are identified. IMPRESSION: 1. Enteric tube seen ending overlying the body of the stomach, with the side port about the fundus of the stomach. 2. Small left pleural effusion noted. Increased interstitial markings raise concern for pulmonary edema. Electronically Signed   By: Roanna Raider M.D.   On: 08/28/2018 22:08   Ir Percutaneous Art Thrombectomy/infusion Intracranial Inc Diag Angio  Result Date: 08/31/2018 CLINICAL DATA:  New onset right-sided weakness with mild word-finding difficulties. Abnormal CT angiogram of the head and neck revealing non flow limiting filling defect in the left middle cerebral artery M1 segment, and also acute occlusion of  the left internal carotid artery at the bulb. EXAM: IR ANGIO INTRA EXTRACRAN SEL COM CAROTID INNOMINATE UNI RIGHT MOD SED; IR PERCUTANEOUS ART THORMBECTOMY/INFUSION INTRACRANIAL INCLUDE DIAG ANGIO; IR ANGIO VERTEBRAL SEL SUBCLAVIAN INNOMINATE UNI LEFT MOD SED; IR CT HEAD LIMITED COMPARISON:  CT angiogram of the head and neck of 08/27/2018 MEDICATIONS: Heparin 3,000 units IV; . Ancef 2 g IV antibiotic was administered within 1 hour of the procedure ANESTHESIA/SEDATION: General anesthesia CONTRAST:  Isovue 300 approximately 100 mL FLUOROSCOPY TIME:  Fluoroscopy Time: 60 minutes 0 seconds (4327 mGy). COMPLICATIONS: None immediate. TECHNIQUE: Informed written consent was obtained from the patient after a thorough discussion of the procedural risks, benefits and alternatives. All questions were addressed. Maximal Sterile Barrier Technique was utilized including caps, mask, sterile gowns, sterile gloves, sterile drape, hand hygiene and skin antiseptic. A timeout was performed prior to the initiation of the procedure. The right groin was prepped and draped in the usual sterile fashion. Thereafter using modified Seldinger technique, transfemoral access into the right common femoral artery was obtained without difficulty. Over a 0.035 inch guidewire, a 5 French Pinnacle sheath was inserted. Through this, and also over 0.035 inch guidewire, a 5 [place] JB 1 catheter was advanced to the aortic arch region and selectively positioned in the right common carotid artery, the innominate artery , the left common carotid artery and the left vertebral artery. FINDINGS: The left common carotid artery tear  g demonstrates a moderate tortuosity of the proximal left common carotid artery The left external carotid artery proximally has a mild to moderate stenosis. Is branches are otherwise normally opacified The left internal carotid artery at the bulb would demonstrates the complete angiographic occlusion with no evidence of a string sign  on the delayed arterial images. The innominate artery arteriogram demonstrates patency of the right subclavian artery and the right common carotid artery proximally There is no angiographic evidence of the right vertebral artery The right common carotid bifurcation demonstrates the right external carotid artery and its major branches to be widely patent The right internal carotid artery just distal to the bulb has a severe 90% plus stenosis. Distal to this there is a U-shaped configuration of the right internal carotid artery. More distally the vessel is normal caliber The petrous the cavernous segments and supraclinoid segments are widely patent There is mild atherosclerotic irregularity involving the proximal cavernous, and distal cavernous portions of the right internal carotid artery. Right posterior communicating artery is seen opacifying the right posterior cerebral artery and the distal basilar artery. The right middle cerebral artery has approximately 30 40% stenosis of the right M1 segment The trifurcation branches appear to be widely patent into the capillary and venous phases The right anterior cerebral artery A1 segment and distally demonstrate wide patency is into the capillary and venous phases There is simultaneous cross-filling via the anterior communicating artery of the left anterior cerebral artery A2 segment and distally. Also demonstrated is opacification of the left anterior cerebral A1 segment, with flow noted into the left middle cerebral artery distribution. Unopacified blood is seen in the left middle cerebral artery from the collateral circulation. 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 More distally  the vessel is seen to opacify to the cranial skull base There is severe focal of focal stenosis of the left vertebrobasilar junction just distal to the origin of the left posterior-inferior cerebellar artery Additionally there is a approximately 70% stenosis of the proximal basilar artery More distally the basilar artery demonstrates mild caliber irregularity. There is a opacification of the left posterior cerebral artery the superior cerebellar arteries left greater than right, and the anterior-inferior cerebellar arteries Retrograde opacification of the right vertebrobasilar junction to the level of the right posterior-inferior cerebellar arteries is noted. Caliber irregularity of the right posterior-inferior cerebellar artery suggests intracranial arteriosclerosis. Left internal carotid artery occlusion or revascularization attempt The diagnostic JB 1 catheter left common carotid artery was then exchanged over a 0.035 inch 300 cm a 7 French Pinnacle sheath in the right groin. A diagnostic JB 1 catheter were then advanced over the rise exchange guidewire to the descending thoracic aorta. The 7 French Pinnacle sheath was then exchanged over a 0.035 inch 100 cm Amplatz stiff guidewire for a a 8 French 55 cm Brite tip neurovascular sheath using biplane roadmap technique and constant fluoroscopic guidance. Good good aspiration was obtained from the side port of the neurovascular sheath. This was then connected to continuous heparinized saline infusion. The embolus guidewire was then removed. Through the 8 French neurovascular sheath, over a 0.035 inch roadrunner guidewire, a 5 5 [place] JB 1 catheter was advanced to the recover region and select position in the left common carotid  artery. Using biplane technique and constant fluoroscopic guidance, over the of the 5 inch roadrunner guidewire, the 5 [place] JB 1 catheter was advanced to the left external carotid artery and exchanged over a 0.035 inch 300 cm rise exchange  guidewire for an 8 French 85 cm flow gap balloon guide catheter which had been prepped with 50% contrast and 50% heparinized saline infusion The guidewire was removed. Good aspiration obtained from the hub of the 8 [place] Flo guide catheter. Gentle contrast injection demonstrated no evidence of spasms dissections or of intraluminal filling defects. At this time, in a coaxial manner and with constant heparinized saline infusion, a tree approached 1 microcatheter was advanced over a point 0.014 inch soft tip synchro micro guidewire to the distal end of the Flo guide catheter The Flood guide catheter was advanced to just inside the left internal carotid artery bulb. Thereafter, multiple attempts were made to advance the microcatheter over the micro guidewire first with Ace synchro soft and then a regular synchro guidewire. In 016 headliner double angled micro guidewire was also utilized in order to access the occluded left internal carotid artery. It was noted that there was a severe tortuosity at the junction of the proximal and the middle 1/3 of the left internal carotid artery Following multiple attempts it was decided to stop. A control was then performed through the [facility] guide catheter in the left common carotid artery which revealed brisk cross-filling of the left internal carotid artery distal cavernous and supraclinoid segments from the left external carotid artery branches via the ipsilateral ophthalmic artery Flow is noted into the left middle cerebral artery as well Also noted was a retrograde opacification from the left posterior communicating artery into the supraclinoid left ICA and then middle cerebral artery An earlier diagnostic catheter arteriogram of the right common carotid artery he had revealed partial cross-filling via the anterior communicating artery from the right internal carotid artery Given the the collateral circulation, it was decided to stop the procedure. Throughout the procedure, the  patient's blood pressure and neurological status remained stable No evidence of hemodynamic instability was noted No gross mass-effect or move filling defects or extravasation was seen Prior to the attempted revascularization of the left internal carotid artery occlusion, the patient was given 180 mg of Brilinta, and 81 mg of aspirin orally the an orogastric tube. Also the patient was loaded with 3000 units IV heparin in order to facilitate revascularization. The the 8 [place] flu guide catheter was then retrieved and removed. The 8 [place] Brite tip neurovascular sheath was then exchanged over a J-tip guidewire for a in 8 French 35 cm Brite tip neurovascular sheath. This was then connected to continuous heparinized saline infusion An arteriogram performed through this 3 5 cm 8 [place] Brite tip neurovascular sheath in the abdominal aorta reveal excellent flow through the common iliacs, the external and internal iliacs, and also the visualized portions of the common femoral arteries below the level of the inguinal ligaments. At the end of procedure, a CT of the brain revealed no evidence of gross intracranial hemorrhage,, or mass effect or midline shift. The patient was left intubated on account of his wound at the time of his intubation The right groin appeared soft without evidence of a hematoma. Distal pulses in the dorsalis pedis, and posterior regions remained dopplerable bilaterally. The patient was then transferred to the neuro ICU for further post stroke management. IMPRESSION: . Status post attempted endovascular revascularization of occluded left carotid artery at the  bulb. 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 Approximately 90% stenosis of the dominant left vertebrobasilar junction distal to the origin of the left posterior-inferior cerebellar artery, and of approximately 70% of the proximal basilar artery. Scattered mild-to-moderate intracranial  arteriosclerosis. PLAN: Follow-up in the clinic 1 month post discharge Electronically Signed   By: Julieanne Cotton M.D.   On: 08/28/2018 16:08   Ct Head Code Stroke Wo Contrast`  Result Date: 08/27/2018 CLINICAL DATA:  Code stroke. Slurred speech beginning 3 weeks ago. Right-sided weakness beginning today. EXAM: CT HEAD WITHOUT CONTRAST TECHNIQUE: Contiguous axial images were obtained from the base of the skull through the vertex without intravenous contrast. COMPARISON:  CT 03/07/2013 FINDINGS: Brain: The brainstem and cerebellum appear normal by CT. Cerebral hemispheres show age related volume loss. There are old cortical and subcortical infarctions in the right frontal lobe, left parietal vertex, left frontal lobe, and left frontoparietal vertex. The show atrophy and encephalomalacia with gliosis. There is no finding of acute or subacute infarction on this CT. There is old lacunar infarction in the left caudate. Vascular: There is atherosclerotic calcification of the major vessels at the base of the brain. No evidence of acute hyperdense vessel. Skull: Negative Sinuses/Orbits: Clear/normal Other: None ASPECTS ([place] Stroke Program Early CT Score) Aspects is difficult in this patient with old infarctions. I do not suspect an acute insult. IMPRESSION: 1. No acute finding by CT. 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 2. 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 No acute finding is suspected by CT. 3. These results were called by telephone at the time of interpretation on 08/27/2018 at 11:47 am to Dr. Daryel November , who verbally acknowledged these results. Electronically Signed   By: Paulina Fusi M.D.   On: 08/27/2018 11:49   Ir Angio Intra Extracran Sel Com Carotid Innominate Uni R Mod Sed  Result Date: 08/31/2018 CLINICAL DATA:  New onset right-sided weakness with mild  word-finding difficulties. 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 EXAM: IR ANGIO INTRA EXTRACRAN SEL COM CAROTID INNOMINATE UNI RIGHT MOD SED; IR PERCUTANEOUS ART THORMBECTOMY/INFUSION INTRACRANIAL INCLUDE DIAG ANGIO; IR ANGIO VERTEBRAL SEL SUBCLAVIAN INNOMINATE UNI LEFT MOD SED; IR CT HEAD LIMITED COMPARISON:  CT angiogram of the head and neck of 08/27/2018 MEDICATIONS: Heparin 3,000 units IV; . Ancef 2 g IV antibiotic was administered within 1 hour of the procedure ANESTHESIA/SEDATION: General anesthesia CONTRAST:  Isovue 300 approximately 100 mL FLUOROSCOPY TIME:  Fluoroscopy Time: 60 minutes 0 seconds (4327 mGy). COMPLICATIONS: None immediate. TECHNIQUE: Informed written consent was obtained from the patient after a thorough discussion of the procedural risks, benefits and alternatives. All questions were addressed. Maximal Sterile Barrier Technique was utilized including caps, mask, sterile gowns, sterile gloves, sterile drape, hand hygiene and skin antiseptic. A timeout was performed prior to the initiation of the procedure. The right groin was prepped and draped in the usual sterile fashion. Thereafter using modified Seldinger technique, transfemoral access into the right common femoral artery was obtained without difficulty. Over a 0.035 inch guidewire, a 5 French Pinnacle sheath was inserted. Through this, and also over 0.035 inch guidewire, a 5 [place] JB 1 catheter was advanced to the aortic arch region and selectively positioned  in the right common carotid artery, the innominate artery , the left common carotid artery and the left vertebral artery. FINDINGS: The left common carotid artery tear g demonstrates a moderate tortuosity of the proximal left common carotid artery The left external carotid artery proximally has a mild to moderate  stenosis. Is branches are otherwise normally opacified The left internal carotid artery at the bulb would demonstrates the complete angiographic occlusion with no evidence of a string sign on the delayed arterial images. The innominate artery arteriogram demonstrates patency of the right subclavian artery and the right common carotid artery proximally There is no angiographic evidence of the right vertebral artery The right common carotid bifurcation demonstrates the right external carotid artery and its major branches to be widely patent The right internal carotid artery just distal to the bulb has a severe 90% plus stenosis. Distal to this there is a U-shaped configuration of the right internal carotid artery. More distally the vessel is normal caliber The petrous the cavernous segments and supraclinoid segments are widely patent There is mild atherosclerotic irregularity involving the proximal cavernous, and distal cavernous portions of the right internal carotid artery. Right posterior communicating artery is seen opacifying the right posterior cerebral artery and the distal basilar artery. The right middle cerebral artery has approximately 30 40% stenosis of the right M1 segment The trifurcation branches appear to be widely patent into the capillary and venous phases The right anterior cerebral artery A1 segment and distally demonstrate wide patency is into the capillary and venous phases There is simultaneous cross-filling via the anterior communicating artery of the left anterior cerebral artery A2 segment and distally. Also demonstrated is opacification of the left anterior cerebral A1 segment, with flow noted into the left middle cerebral artery distribution. Unopacified blood is seen in the left middle cerebral artery from the collateral circulation. The left subclavian arteriogram demonstrates mild stenosis at the origin of the right subclavian artery Mild atherosclerotic disease is noted of the  subclavian artery and in the region of the thyrocervical trunk The dominant left vertebral artery origin is widely patent The vessel demonstrates mild tortuosity just distal to this. More distally the vessel is seen to opacify to the cranial skull base There is severe focal of focal stenosis of the left vertebrobasilar junction just distal to the origin of the left posterior-inferior cerebellar artery Additionally there is a approximately 70% stenosis of the proximal basilar artery More distally the basilar artery demonstrates mild caliber irregularity. There is a opacification of the left posterior cerebral artery the superior cerebellar arteries left greater than right, and the anterior-inferior cerebellar arteries Retrograde opacification of the right vertebrobasilar junction to the level of the right posterior-inferior cerebellar arteries is noted. Caliber irregularity of the right posterior-inferior cerebellar artery suggests intracranial arteriosclerosis. Left internal carotid artery occlusion or revascularization attempt The diagnostic JB 1 catheter left common carotid artery was then exchanged over a 0.035 inch 300 cm a 7 French Pinnacle sheath in the right groin. A diagnostic JB 1 catheter were then advanced over the rise exchange guidewire to the descending thoracic aorta. The 7 French Pinnacle sheath was then exchanged over a 0.035 inch 100 cm Amplatz stiff guidewire for a a 8 French 55 cm Brite tip neurovascular sheath using biplane roadmap technique and constant fluoroscopic guidance. Good good aspiration was obtained from the side port of the neurovascular sheath. This was then connected to continuous heparinized saline infusion. The embolus guidewire was then removed. Through the 8 French neurovascular sheath,  over a 0.035 inch roadrunner guidewire, a 5 5 [place] JB 1 catheter was advanced to the recover region and select position in the left common carotid artery. Using biplane technique and constant  fluoroscopic guidance, over the of the 5 inch roadrunner guidewire, the 5 [place] JB 1 catheter was advanced to the left external carotid artery and exchanged over a 0.035 inch 300 cm rise exchange guidewire for an 8 French 85 cm flow gap balloon guide catheter which had been prepped with 50% contrast and 50% heparinized saline infusion The guidewire was removed. Good aspiration obtained from the hub of the 8 [place] Flo guide catheter. Gentle contrast injection demonstrated no evidence of spasms dissections or of intraluminal filling defects. At this time, in a coaxial manner and with constant heparinized saline infusion, a tree approached 1 microcatheter was advanced over a point 0.014 inch soft tip synchro micro guidewire to the distal end of the Flo guide catheter The Flood guide catheter was advanced to just inside the left internal carotid artery bulb. Thereafter, multiple attempts were made to advance the microcatheter over the micro guidewire first with Ace synchro soft and then a regular synchro guidewire. In 016 headliner double angled micro guidewire was also utilized in order to access the occluded left internal carotid artery. It was noted that there was a severe tortuosity at the junction of the proximal and the middle 1/3 of the left internal carotid artery Following multiple attempts it was decided to stop. A control was then performed through the [facility] guide catheter in the left common carotid artery which revealed brisk cross-filling of the left internal carotid artery distal cavernous and supraclinoid segments from the left external carotid artery branches via the ipsilateral ophthalmic artery Flow is noted into the left middle cerebral artery as well Also noted was a retrograde opacification from the left posterior communicating artery into the supraclinoid left ICA and then middle cerebral artery An earlier diagnostic catheter arteriogram of the right common carotid artery he had revealed partial  cross-filling via the anterior communicating artery from the right internal carotid artery Given the the collateral circulation, it was decided to stop the procedure. Throughout the procedure, the patient's blood pressure and neurological status remained stable No evidence of hemodynamic instability was noted No gross mass-effect or move filling defects or extravasation was seen Prior to the attempted revascularization of the left internal carotid artery occlusion, the patient was given 180 mg of Brilinta, and 81 mg of aspirin orally the an orogastric tube. Also the patient was loaded with 3000 units IV heparin in order to facilitate revascularization. The the 8 [place] flu guide catheter was then retrieved and removed. The 8 [place] Brite tip neurovascular sheath was then exchanged over a J-tip guidewire for a in 8 French 35 cm Brite tip neurovascular sheath. This was then connected to continuous heparinized saline infusion An arteriogram performed through this 3 5 cm 8 French Brite tip neurovascular sheath in the abdominal aorta reveal excellent flow through the common iliacs, the external and internal iliacs, and also the visualized portions of the common femoral arteries below the level of the inguinal ligaments. At the end of procedure, a CT of the brain revealed no evidence of gross intracranial hemorrhage,, or mass effect or midline shift. The patient was left intubated on account of his wound at the time of his intubation The right groin appeared soft without evidence of a hematoma. Distal pulses in the dorsalis pedis, and posterior regions remained dopplerable bilaterally. The  patient was then transferred to the neuro ICU for further post stroke management. IMPRESSION: . 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 Approximately 90% stenosis of the dominant  left vertebrobasilar junction distal to the origin of the left posterior-inferior cerebellar artery, and of approximately 70% of the proximal basilar artery. 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 PLAN: Follow-up in the clinic 1 month post discharge Electronically Signed   By: Julieanne CottonSanjeev  Deveshwar M.D.   On: 08/28/2018 16:08   Ir Angio Vertebral Sel Subclavian Innominate Uni L Mod Sed  Result Date: 08/31/2018 CLINICAL DATA:  New onset right-sided weakness with mild word-finding difficulties. 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 EXAM: IR ANGIO INTRA EXTRACRAN SEL COM CAROTID INNOMINATE UNI RIGHT MOD SED; IR PERCUTANEOUS ART THORMBECTOMY/INFUSION INTRACRANIAL INCLUDE DIAG ANGIO; IR ANGIO VERTEBRAL SEL SUBCLAVIAN INNOMINATE UNI LEFT MOD SED; IR CT HEAD LIMITED COMPARISON:  CT angiogram of the head and neck of 08/27/2018 MEDICATIONS: Heparin 3,000 units IV; . Ancef 2 g IV antibiotic was administered within 1 hour of the procedure ANESTHESIA/SEDATION: General anesthesia CONTRAST:  Isovue 300 approximately 100 mL FLUOROSCOPY TIME:  Fluoroscopy Time: 60 minutes 0 seconds (4327 mGy). COMPLICATIONS: None immediate. TECHNIQUE: Informed written consent was obtained from the patient after a thorough discussion of the procedural risks, benefits and alternatives. All questions were addressed. Maximal Sterile Barrier Technique was utilized including caps, mask, sterile gowns, sterile gloves, sterile drape, hand hygiene and skin antiseptic. A timeout was performed prior to the initiation of the procedure. The right groin was prepped and draped in the usual sterile fashion. Thereafter using modified Seldinger technique, transfemoral access into the right common femoral artery was obtained without difficulty. Over a 0.035 inch guidewire, a 5  French Pinnacle sheath was inserted. Through this, and also over 0.035 inch guidewire, a 5 [place] JB 1 catheter was advanced to the aortic arch region and selectively positioned in the right common carotid artery, the innominate artery , the left common carotid artery and the left vertebral artery. FINDINGS: The left common carotid artery tear g demonstrates a moderate tortuosity of the proximal left common carotid artery The left external carotid artery proximally has a mild to moderate stenosis. Is branches are otherwise normally opacified The left internal carotid artery at the bulb would demonstrates the complete angiographic occlusion with no evidence of a string sign on the delayed arterial images. The innominate artery arteriogram demonstrates patency of the right subclavian artery and the right common carotid artery proximally There is no angiographic evidence of the right vertebral artery The right common carotid bifurcation demonstrates the right external carotid artery and its major branches to be widely patent The right internal carotid artery just distal to the bulb has a severe 90% plus stenosis. Distal to this there is a U-shaped configuration of the right internal carotid artery. More distally the vessel is normal caliber The petrous the cavernous segments and supraclinoid segments are widely patent There is mild atherosclerotic irregularity involving the proximal cavernous, and distal cavernous portions of the right internal carotid artery. Right posterior communicating artery is seen opacifying the right posterior cerebral artery and the distal basilar artery. The right middle cerebral artery has approximately 30 40% stenosis of the right M1  segment The trifurcation branches appear to be widely patent into the capillary and venous phases The right anterior cerebral artery A1 segment and distally demonstrate wide patency is into the capillary and venous phases There is simultaneous cross-filling via  the anterior communicating artery of the left anterior cerebral artery A2 segment and distally. Also demonstrated is opacification of the left anterior cerebral A1 segment, with flow noted into the left middle cerebral artery distribution. Unopacified blood is seen in the left middle cerebral artery from the collateral circulation. 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 More distally the vessel is seen to opacify to the cranial skull base There is severe focal of focal stenosis of the left vertebrobasilar junction just distal to the origin of the left posterior-inferior cerebellar artery Additionally there is a approximately 70% stenosis of the proximal basilar artery More distally the basilar artery demonstrates mild caliber irregularity. There is a opacification of the left posterior cerebral artery the superior cerebellar arteries left greater than right, and the anterior-inferior cerebellar arteries Retrograde opacification of the right vertebrobasilar junction to the level of the right posterior-inferior cerebellar arteries is noted. Caliber irregularity of the right posterior-inferior cerebellar artery suggests intracranial arteriosclerosis. Left internal carotid artery occlusion or revascularization attempt The diagnostic JB 1 catheter left common carotid artery was then exchanged over a 0.035 inch 300 cm a 7 French Pinnacle sheath in the right groin. A diagnostic JB 1 catheter were then advanced over the rise exchange guidewire to the descending thoracic aorta. The 7 French Pinnacle sheath was then exchanged over a 0.035 inch 100 cm Amplatz stiff guidewire for a a 8 French 55 cm Brite tip neurovascular sheath using  biplane roadmap technique and constant fluoroscopic guidance. Good good aspiration was obtained from the side port of the neurovascular sheath. This was then connected to continuous heparinized saline infusion. The embolus guidewire was then removed. Through the 8 French neurovascular sheath, over a 0.035 inch roadrunner guidewire, a 5 5 [place] JB 1 catheter was advanced to the recover region and select position in the left common carotid artery. Using biplane technique and constant fluoroscopic guidance, over the of the 5 inch roadrunner guidewire, the 5 [place] JB 1 catheter was advanced to the left external carotid artery and exchanged over a 0.035 inch 300 cm rise exchange guidewire for an 8 French 85 cm flow gap balloon guide catheter which had been prepped with 50% contrast and 50% heparinized saline infusion The guidewire was removed. Good aspiration obtained from the hub of the 8 [place] Flo guide catheter. Gentle contrast injection demonstrated no evidence of spasms dissections or of intraluminal filling defects. At this time, in a coaxial manner and with constant heparinized saline infusion, a tree approached 1 microcatheter was advanced over a point 0.014 inch soft tip synchro micro guidewire to the distal end of the Flo guide catheter The Flood guide catheter was advanced to just inside the left internal carotid artery bulb. Thereafter, multiple attempts were made to advance the microcatheter over the micro guidewire first with Ace synchro soft and then a regular synchro guidewire. In 016 headliner double angled micro guidewire was also utilized in order to access the occluded left internal carotid artery. It was noted that there was a severe  tortuosity at the junction of the proximal and the middle 1/3 of the left internal carotid artery Following multiple attempts it was decided to stop. A control was then performed through the [facility] guide catheter in the left common carotid artery which revealed brisk  cross-filling of the left internal carotid artery distal cavernous and supraclinoid segments from the left external carotid artery branches via the ipsilateral ophthalmic artery Flow is noted into the left middle cerebral artery as well Also noted was a retrograde opacification from the left posterior communicating artery into the supraclinoid left ICA and then middle cerebral artery An earlier diagnostic catheter arteriogram of the right common carotid artery he had revealed partial cross-filling via the anterior communicating artery from the right internal carotid artery Given the the collateral circulation, it was decided to stop the procedure. Throughout the procedure, the patient's blood pressure and neurological status remained stable No evidence of hemodynamic instability was noted No gross mass-effect or move filling defects or extravasation was seen Prior to the attempted revascularization of the left internal carotid artery occlusion, the patient was given 180 mg of Brilinta, and 81 mg of aspirin orally the an orogastric tube. Also the patient was loaded with 3000 units IV heparin in order to facilitate revascularization. The the 8 [place] flu guide catheter was then retrieved and removed. The 8 [place] Brite tip neurovascular sheath was then exchanged over a J-tip guidewire for a in 8 French 35 cm Brite tip neurovascular sheath. This was then connected to continuous heparinized saline infusion An arteriogram performed through this 3 5 cm 8 French Brite tip neurovascular sheath in the abdominal aorta reveal excellent flow through the common iliacs, the external and internal iliacs, and also the visualized portions of the common femoral arteries below the level of the inguinal ligaments. At the end of procedure, a CT of the brain revealed no evidence of gross intracranial hemorrhage,, or mass effect or midline shift. The patient was left intubated on account of his wound at the time of his intubation The  right groin appeared soft without evidence of a hematoma. Distal pulses in the dorsalis pedis, and posterior regions remained dopplerable bilaterally. The patient was then transferred to the neuro ICU for further post stroke management. IMPRESSION: . 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 Approximately 90% stenosis of the dominant left vertebrobasilar junction distal to the origin of the left posterior-inferior cerebellar artery, and of approximately 70% of the proximal basilar artery. 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 PLAN: Follow-up in the clinic 1 month post discharge Electronically Signed   By: Julieanne Cotton M.D.   On: 08/28/2018 16:08   Vas [place] Lower Extremity Arterial Duplex  Result Date: 09/04/2018 LOWER EXTREMITY ARTERIAL DUPLEX STUDY  Current ABI: Not obtained Limitations: Cerebral angiogram 08/27/18, now with bleeding, pain, and bruising              at groin stick site. Performing Technologist: Gertie Fey MHA, RDMS, RVT, RDCS  Examination Guidelines: A complete evaluation includes B-mode imaging, spectral Doppler, color Doppler, and power Doppler as needed of all accessible portions of each vessel. Bilateral testing is considered an integral part of a complete examination. Limited examinations for reoccurring indications may be performed as noted.  Right Duplex Findings: +----------+--------+-----+--------+----------+--------+           PSV cm/sRatioStenosisWaveform  Comments +----------+--------+-----+--------+----------+--------+ CFA Prox  102  monophasic         +----------+--------+-----+--------+----------+--------+ CFA Distal90                   monophasic         +----------+--------+-----+--------+----------+--------+ PTA Distal32                   monophasic          +----------+--------+-----+--------+----------+--------+ DP        44                   monophasic         +----------+--------+-----+--------+----------+--------+  Summary: Right: A right common femoral artery pseudoaneurysm was identified measuring 2.8 x 1.5 x 2.5 cm, with a neck measuring 1.3cm long x 0.6cm wide. Pseudoaneurysm measures 2.1cm from the surface of the skin. The proximal right common femoral artery exhibits a monophasic waveform, suggestive of possible aortoiliac obstruction. Distal posterior tibial artery exhibits monophasic flow, and the right dorsalis pedis artery exhibits monophasic flow, suggestive of proximal obstruction.  See table(s) above for measurements and observations.    Preliminary     PHYSICAL EXAM  Temp:  [98.1 F (36.7 C)-99.7 F (37.6 C)] 98.3 F (36.8 C) (01/03 1458) Pulse Rate:  [70-121] 70 (01/03 1724) Resp:  [18-35] 20 (01/03 1724) BP: (100-139)/(57-92) 135/57 (01/03 1724) SpO2:  [85 %-100 %] 100 % (01/03 1724)  General - Well nourished, well developed, in mild respiratory distress.  Right UE swelling improved  Ophthalmologic - fundi not visualized due to noncooperation.  Cardiovascular - irregularly irregular heart rate and rhythm.  Abdomen - right lower abdominal wound still oozing blood  Neuro - awake alert, in mild respiratory distress on lying down, able to following simple commands. Able to repeat sentences and spontaneous speech but paucity of speech. PERRLA, EOMI, able to track objects on the both sides, inconsistent blinking to visual threat bilaterally.  Facial symmetric.  Tongue midline.  Left upper extremity 4/5.  Right lower extremity and right upper extremity flaccid.  Left lower extremity proximal 2+/5 and distal 3/5.  DTR 1+, Babinski negative.  Sensation symmetrical, coordination slow but intact on the left finger-to-nose and gait not tested.   ASSESSMENT/PLAN Mr.Oney Lonney Revak a 78 y.o.malewith history of HTN,  atrial fibrillation on Pradaxa last dose last evening, urethral stricture, cardiomyopathy, peripheral vascular disease, hypertension, hyperlipidemia, stroke with some balance issues, bilateral lymphedema vitamin D deficiencypresenting with right lower extremity numbness and weaknessand suspected new Lt ICA occlusion.Hedid not receive IV t-PA due to late presentation.  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  MRI head -left MCA and ACA territory scattered small infarcts  CTA H&N - left ICA, right VA occlusion.  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  2D Echo - EF 60 to 65%  LDL- 81  HgbA1c- 7.1  VTE prophylaxis -SCDs  aspirin 81 mg daily and Pradaxa (dabigatran) twice a dayprior to admission, now on Eliquis 5 mg twice daily  Ongoing aggressive stroke risk factor management  Therapy recommendations: SNF  Disposition: Pending  Right femoral A. 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 early on board  No intervention needed at this time  Continue dressing drainage  Pseudoaneurysm need to be addressed in the future with thrombin injection  Aspiration pneumonitis  Fever Tmax 101.2->101.3-> afebrile->101.3-> afebrile  Leukocytosis WBC 22.2-15.5-11.4-13.9->16.6-> 19.2->14.7->18.6  CXR CHF  Off lasix  Sputum culture normal flora  Off Zosyn today  UA negative  Respiratory distress  Was intubated on  ventilation  Self extubated on 09/01/18  Off sedation  So far on nasal cannula, tolerating  CCM signed off  Atrial fibrillation, chronic  On Pradaxa PTA, compliant with medication  On metoprolol, rate controlled  On Eliquis and amiodarone po  Rate controlled  Cardiology signed off  Hypotension History of hypertension  BP stable   Off neo  On metoprolol  Avoid hypotension due to 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 as well as basal artery stenosis  BP goal 120-160  CHF  CXR showed CHF  Fluid overload - off lasix now  Anasarca much improved  Cardiology signed off  Hyperlipidemia  Lipid lowering medication XLK:GMWN  LDL81, goal < 70  Current lipid lowering medication:none (statin intolerant - abnormal labs)  Add zetia   Continue zetia on d/c  Diabetes  HgbA1c7.1, goal < 7.0  Uncontrolled  Hyperglycemia improved  SSI  CBG monitoring  On Levemir, continue  Intermittent agitation  Likely due to aphasia, right hemiparesis and discomfort position with obesity  Better with family in room  Started on low dose seroquel 12.5mg  bid  Close monitoring  Other Stroke Risk Factors  Advanced age  Former cigarette smoker - quit  Obesity,Body mass index is 47.79 kg/m., recommend weight loss, diet and exercise as appropriate   Hx stroke/TIA  PVD  Other Active Problems  Urethral stricture -on Foley  Diffuse anasarca - much improved, continue diuresis one more day  Hypokalemia potassium 2.8->3.3, supplement  Low Magnesium 1.8 - supplement  Hospital day # 9  This patient is critically ill due to left MCA and ACA infarct, bilateral ICA occlusions/stenosis, A. fib, femoral artery pseudoaneurysm, respiratory failure, aspiration pneumonia, fever and at significant risk of neurological worsening, death form recurrent stroke, hemorrhagic conversion, seizure, heart failure, septic shock, sepsis. This patient's care requires  constant monitoring of vital signs, hemodynamics, respiratory and cardiac monitoring, review of multiple databases, neurological assessment, discussion with family, other specialists and medical decision making of high complexity. I spent 35 minutes of neurocritical care time in the care of this patient. I had long discussion with son and pt at bedside, updated pt current condition, treatment plan and potential prognosis. They expressed understanding and appreciation.      Marvel Plan, MD PhD Stroke Neurology 09/05/2018 5:38 PM     To contact Stroke Continuity provider, please refer to [email or web address]. After hours, contact General Neurology

## 2018-09-05 NOTE — Progress Notes (Signed)
Pt admitted to the unit as a transfer from [facility]. Pt sleepy but response to voice when called. Telemetry applied and verified with CCMD: RUE edematous with pink foam dsg; pt due to void; RLQ hematoma remains unremarkable with dry applied intact to oozing site. Bed alarm on; call light within reach and will continue to closely monitor pt. Dionne Bucy RN   09/05/18 1458  Vitals  Temp 98.3 F (36.8 C)  Temp Source Oral  BP 118/66  MAP (mmHg) 82  BP Location Left Arm  BP Method Automatic  Patient Position (if appropriate) Lying  Pulse Rate 86  Pulse Rate Source Monitor  Resp 18  Oxygen Therapy  SpO2 96 %  O2 Device Room Air  O2 Flow Rate (L/min) 2 L/min

## 2018-09-05 NOTE — Consult Note (Signed)
WOC Nurse wound consult note Patient receiving care in [facility] 2H04. Reason for Consult: Care of right arm Wound type: Multiple fluid filled bullae Dressing procedure/placement/frequency: Place vaseline gauze over the fluid filled areas then cover with foam dressings.  Change every 3 - 5 days. Monitor the wound area(s) for worsening of condition such as: Signs/symptoms of infection,  Increase in size,  Development of or worsening of odor, Development of pain, or increased pain at the affected locations.  Notify the medical team if any of these develop.  Thank you for the consult.  Discussed plan of care with the patient and bedside nurse.  WOC nurse will not follow at this time.  Please re-consult the WOC team if needed.  Helmut Muster, RN, MSN, CWOCN, CNS-BC, pager [phone]

## 2018-09-05 NOTE — Progress Notes (Addendum)
Nutrition Follow-up  DOCUMENTATION CODES:   Morbid obesity  INTERVENTION:    Glucerna Shake po BID, each supplement provides 220 kcal and 10 grams of protein  NEW NUTRITION DIAGNOSIS:   Increased nutrient needs related to acute illness as evidenced by estimated needs, ongoing  GOAL:   Patient will meet greater than or equal to 90% of their needs, progressing  MONITOR:   PO intake, Supplement acceptance, Labs, Skin, Weight trends, I & O's  ASSESSMENT:   78 year old male who presented to the [facility] ED on 12/25 with weakness, SOB, and chest pressure. CTA showing L ICA occlusion. Code Stroke. Pt did not receive TPA because he takes Pradaxa. PMH significant for HTN, T2DM, HLD, atrial fibrillation, chronic lymphedema, GERD. Pt transferred to [facility] and went to IR but unable to extract the clot.  12/30 self-extubated 12/31 failed bedside swallow evaluation   Pt s/p dysphagia treatment 09/03/18. Advanced to HH/Carbohydrate Modified diet. Spoke with Marchelle Folks, RN. Pt didn't like his breakfast.  Meal completion approximately 25% this AM. Pt would benefit from nutrition supplements. Labs & medications reviewed. K 3.3 (L).  CBG's [phone].  Diet Order:   Diet Order            Diet heart healthy/carb modified Room service appropriate? Yes; Fluid consistency: Thin  Diet effective now             EDUCATION NEEDS:   No education needs have been identified at this time  Skin:  Skin Assessment: Skin Integrity Issues: Skin Integrity Issues:: Other (Comment) Other: MASD to perineum, cellulitis to L and R legs  Last BM:  1/1   Intake/Output Summary (Last 24 hours) at 09/05/2018 1030 Last data filed at 09/05/2018 1000 Gross per 24 hour  Intake 419.97 ml  Output 4355 ml  Net -3935.03 ml   Height:   Ht Readings from Last 1 Encounters:  08/27/18 5\' 9"  (1.753 m)   Weight:   Wt Readings from Last 1 Encounters:  09/03/18 (!) 136.9 kg   Ideal Body Weight:  72.7 kg  BMI:  Body  mass index is 44.57 kg/m.  Estimated Nutritional Needs:   Kcal:  1900-2100  Protein:  100-115 grams  Fluid:  > 1.9 L/day  Maureen Chatters, RD, LDN Pager #: [phone] After-Hours Pager #: [phone]

## 2018-09-05 NOTE — Progress Notes (Signed)
Patient ID: Darren Frazier, male   DOB: 02/05/1941, 78 y.o.   MRN: [id number] Patient had some oozing of old hematoma from groin Darren Frazier this morning.  Currently no further oozing.  This is all old old hematoma.  The patient is morbidly obese with a huge pannus.  Bruising into this as well.  No active bleeding.  Does have a small false aneurysm.  May require to address this at some point later as he continues to recover.  Will follow and see again Monday.  Please call my partners over the weekend for concerns

## 2018-09-05 NOTE — Progress Notes (Signed)
Progress Note  Patient Name: Darren Frazier Date of Encounter: 09/05/2018  Primary Cardiologist:   No primary care provider on file.   Subjective   Rapid rate last night noted.  Somnolent but wakes and answers questions.  Denies pain.   Inpatient Medications    Scheduled Meds: . amiodarone  200 mg Oral Daily  . apixaban  5 mg Oral BID  . Chlorhexidine Gluconate Cloth  6 each Topical Q0600  . cholecalciferol  1,000 Units Per Tube Daily  . ezetimibe  10 mg Oral Daily  . feeding supplement (GLUCERNA SHAKE)  237 mL Oral BID BM  . insulin aspart  0-20 Units Subcutaneous TID WC  . insulin detemir  10 Units Subcutaneous Daily  . levothyroxine  88 mcg Oral Q0600  . mouth rinse  15 mL Mouth Rinse BID  . metoprolol tartrate  50 mg Oral BID  . mupirocin ointment  1 application Nasal BID  . potassium chloride  40 mEq Oral Q4H  . QUEtiapine  12.5 mg Oral BID   Continuous Infusions: . sodium chloride Stopped (09/05/18 1134)   PRN Meds: sodium chloride, acetaminophen **OR** acetaminophen (TYLENOL) oral liquid 160 mg/5 mL **OR** acetaminophen, haloperidol lactate, metoprolol tartrate, ondansetron (ZOFRAN) IV, polyvinyl alcohol, senna-docusate   Vital Signs      Vitals:   09/05/18 1149 09/05/18 1200 09/05/18 1230 09/05/18 1458  BP:  (!) 119/57 131/60 118/66  Pulse:  79 82 86  Resp:  (!) 34 (!) 32 18  Temp: 98.1 F (36.7 C)   98.3 F (36.8 C)  TempSrc: Oral   Oral  SpO2:  100% 99% (!) 85%  Weight:      Height:       Physical Exam   GEN: No  acute distress.   Neck:    No JVD Cardiac: Irregular RR, no murmurs, rubs, or gallops.  Respiratory: Clear  to auscultation bilaterally. GI: Soft, nontender, non-distended, normal bowel sounds  MS:  Diffuse edema; No deformity. Neuro:   Nonfocal  Psych: Oriented and appropriate     Intake/Output Summary (Last 24 hours) at 09/05/2018 1624 Last data filed at 09/05/2018 1400 Gross per 24 hour  Intake 1162.44 ml  Output 1755 ml   Net -592.56 ml   Filed Weights   09/01/18 0500 09/02/18 0500 09/03/18 0500  Weight: (!) 145.7 kg 129.8 kg (!) 136.9 kg    Telemetry    Atrial fib with periodic rapid rate.   - Personally Reviewed  ECG    NA  - Personally Reviewed  Labs    Chemistry Recent Labs  Lab 09/04/18 0530 09/04/18 2157 09/05/18 0308  NA 145 144 144  K 2.8* 3.3* 3.3*  CL 103 101 99  CO2 31 31 34*  GLUCOSE 144* 156* 168*  BUN 14 11 12   CREATININE 1.07 1.10 1.18  CALCIUM 8.0* 8.4* 8.5*  GFRNONAA >60 >60 59*  GFRAA >60 >60 >60  ANIONGAP 11 12 11      Hematology Recent Labs  Lab 09/03/18 0206 09/04/18 0530 09/05/18 0308  WBC 19.2* 14.7* 18.6*  RBC 3.06* 2.99* 3.33*  HGB 9.9* 9.3* 10.7*  HCT 31.3* 30.2* 33.5*  MCV 102.3* 101.0* 100.6*  MCH 32.4 31.1 32.1  MCHC 31.6 30.8 31.9  RDW 14.6 15.1 15.0  PLT 363 377 500*    Cardiac Enzymes Recent Labs  Lab 08/31/18 1027  TROPONINI 12.47*   No results for input(s): TROPIPOC in the last 168 hours.   BNPNo results for input(s): BNP,  PROBNP in the last 168 hours.   DDimer No results for input(s): DDIMER in the last 168 hours.   Radiology    Vas [place] Lower Extremity Arterial Duplex  Result Date: 09/04/2018 LOWER EXTREMITY ARTERIAL DUPLEX STUDY  Current ABI: Not obtained Limitations: Cerebral angiogram 08/27/18, now with bleeding, pain, and bruising              at groin stick site. Performing Technologist: Gertie Fey MHA, RDMS, RVT, RDCS  Examination Guidelines: A complete evaluation includes B-mode imaging, spectral Doppler, color Doppler, and power Doppler as needed of all accessible portions of each vessel. Bilateral testing is considered an integral part of a complete examination. Limited examinations for reoccurring indications may be performed as noted.  Right Duplex Findings: +----------+--------+-----+--------+----------+--------+           PSV cm/sRatioStenosisWaveform  Comments  +----------+--------+-----+--------+----------+--------+ CFA Prox  102                  monophasic         +----------+--------+-----+--------+----------+--------+ CFA Distal90                   monophasic         +----------+--------+-----+--------+----------+--------+ PTA Distal32                   monophasic         +----------+--------+-----+--------+----------+--------+ DP        44                   monophasic         +----------+--------+-----+--------+----------+--------+  Summary: Right: A right common femoral artery pseudoaneurysm was identified measuring 2.8 x 1.5 x 2.5 cm, with a neck measuring 1.3cm long x 0.6cm wide. Pseudoaneurysm measures 2.1cm from the surface of the skin. The proximal right common femoral artery exhibits a monophasic waveform, suggestive of possible aortoiliac obstruction. Distal posterior tibial artery exhibits monophasic flow, and the right dorsalis pedis artery exhibits monophasic flow, suggestive of proximal obstruction.  See table(s) above for measurements and observations. Electronically signed by Gretta Began MD on 09/04/2018 at 5:42:19 PM.    Final     Cardiac Studies   Study Conclusions  - Left ventricle: The cavity size was normal. Systolic function was   normal. The estimated ejection fraction was in the range of 60%   to 65%. Images were inadequate for LV wall motion assessment. The   study is not technically sufficient to allow evaluation of LV   diastolic function. - Aortic valve: Valve mobility was moderately restricted.   Suboptimal image quality limited Doppler assessment of aortic   valve systolic gradient. There was no regurgitation. - Mitral valve: Moderately calcified annulus. There was trivial   regurgitation. - Left atrium: The atrium was moderately dilated. - Tricuspid valve: There was trivial regurgitation.   Patient Profile     78 y.o. male hx of paroxysmal afib on pradaxa, unsure about his compliance, NICM  with EF of 45% in 2015, which has since improved on most recent echo done here on 12/26 to 60-65%, AAA, OSA, HTN, Type 2 DM, PVD comes to the hospital with an acute ischemic CVA with  L MCA infarct with a L ICA occlusion. We are involved for management of his atrial fib.   Assessment & Plan    Atrial fib:  Rate is better today.  On PO amiodarone.  Continue current meds.  BP is slightly labile and will not allow med titration.  Elevated Troponin/NSTEMI: EF was OK on echo.  Down 4 today.  Continue IV diuresis.   We will follow as needed.    For questions or updates, please contact [facility] Please consult [email or web address] for contact info under Cardiology/STEMI.   Signed, Rollene RotundaJames Lakresha Stifter, MD  09/05/2018, 4:24 PM

## 2018-09-05 NOTE — Progress Notes (Signed)
Physical Therapy Treatment Patient Details Name: Darren Frazier MRN: [id number] DOB: [birth year] Today's Date: 09/05/2018    History of Present Illness 78 yo male  PMH including Cardiomyopathy (HCC), DM, Dysrhythmia, AAA, Hyperlipidemia, HTN, Hypothyroidism, Morbid obesity PVD, Stroke (2011), Tremors of nervous system. Presented to [facility] 12/25 with Rt leg numbness and weakness.  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  He was transferred to [facility].  He was intubated for neuro-IR intervention 12/25, self-extubated 12/30. MRI head left MCA/ACA territory scattered small acute infarcts; multiple scattered chronic underlying cortical infarcts involving the bilateral cerebral and right cerebellar hemispheres; moderately advanced cerebral atrophy with chronic small vessel.     PT Comments    Patient progressing very slowly towards his physical therapy goals. Session focused on sitting balance and facilitation of RUE/RLE weightbearing. Pt able to maintain 15 minutes of static sitting at the edge of the bed this session with min-moderate assistance. Continues to require up to two person total assistance for bed mobility. No active movement noted of RUE this session, but pt son reports pt gave him a thumbs up sign this morning. Pt is also demonstrating right neglect and/or right visual deficits. Encouraged pt son to continue to sit on right side for increased pt attention towards right. D/c plan remains appropriate.    Follow Up Recommendations  SNF     Equipment Recommendations  Wheelchair (measurements PT);Wheelchair cushion (measurements PT)    Recommendations for Other Services       Precautions / Restrictions Precautions Precautions: Fall Restrictions Weight Bearing Restrictions: No    Mobility  Bed Mobility Overal bed mobility: Needs Assistance Bed Mobility: Supine to Sit;Sit to Sidelying;Rolling Rolling: Max assist;+2 for physical assistance   Supine to sit: +2 for safety/equipment;+2 for  physical assistance;Total assist   Sit to sidelying: +2 for physical assistance;+2 for safety/equipment;Total assist General bed mobility comments: pt requiring maxA + 2 for rolling towards right and totalA + 2 for supine <> sit.   Transfers                 General transfer comment: NT this session - at this time only safe with lift equipment  Ambulation/Gait             General Gait Details: NT   Stairs             Wheelchair Mobility    Modified Rankin (Stroke Patients Only) Modified Rankin (Stroke Patients Only) Pre-Morbid Rankin Score: No significant disability Modified Rankin: Severe disability     Balance Overall balance assessment: Needs assistance Sitting-balance support: Single extremity supported;Feet supported Sitting balance-Leahy Scale: Poor Sitting balance - Comments: able to progress from min-modA for static seated balance x 15 mins. needs manual assistance to achieve midline. able to prop on LUE                                     Cognition Arousal/Alertness: Lethargic Behavior During Therapy: Flat affect Overall Cognitive Status: Impaired/Different from baseline Area of Impairment: Attention;Memory;Following commands;Safety/judgement;Awareness;Problem solving                   Current Attention Level: Focused Memory: Decreased recall of precautions;Decreased short-term memory Following Commands: Follows one step commands inconsistently;Follows one step commands with increased time Safety/Judgement: Decreased awareness of safety;Decreased awareness of deficits Awareness: Intellectual Problem Solving: Slow processing;Decreased initiation;Difficulty sequencing;Requires verbal cues;Requires tactile cues General Comments: answers basic questions with  frequent redirection       Exercises General Exercises - Lower Extremity Short Arc Quad: Left;Other (comment);Seated(3 reps) Other Exercises Other Exercises: Seated:  x5 lateral leans onto RUE for increased weightbearing    General Comments        Pertinent Vitals/Pain Pain Assessment: Faces Pain Score: 0-No pain    Home Living                      Prior Function            PT Goals (current goals can now be found in the care plan section) Acute Rehab PT Goals Patient Stated Goal: none stated PT Goal Formulation: With patient/family Time For Goal Achievement: 09/17/18 Potential to Achieve Goals: Fair Progress towards PT goals: Progressing toward goals    Frequency    Min 3X/week      PT Plan Current plan remains appropriate    Co-evaluation              AM-PAC PT "6 Clicks" Mobility   Outcome Measure  Help needed turning from your back to your side while in a flat bed without using bedrails?: Total Help needed moving from lying on your back to sitting on the side of a flat bed without using bedrails?: Total Help needed moving to and from a bed to a chair (including a wheelchair)?: Total Help needed standing up from a chair using your arms (e.g., wheelchair or bedside chair)?: Total Help needed to walk in hospital room?: Total Help needed climbing 3-5 steps with a railing? : Total 6 Click Score: 6    End of Session Equipment Utilized During Treatment: Oxygen Activity Tolerance: Patient tolerated treatment well Patient left: in bed;with call bell/phone within reach;with family/visitor present Nurse Communication: Mobility status PT Visit Diagnosis: Unsteadiness on feet (R26.81);Difficulty in walking, not elsewhere classified (R26.2)     Time: 3662-9476 PT Time Calculation (min) (ACUTE ONLY): 20 min  Charges:  $Therapeutic Activity: 8-22 mins                     Laurina Bustle, PT, DPT Acute Rehabilitation Services Pager [phone] Office [phone]    Vanetta Mulders 09/05/2018, 11:20 AM

## 2018-09-06 LAB — CBC
HCT: 34 % — ABNORMAL LOW (ref 39.0–52.0)
Hemoglobin: 11 g/dL — ABNORMAL LOW (ref 13.0–17.0)
MCH: 33 pg (ref 26.0–34.0)
MCHC: 32.4 g/dL (ref 30.0–36.0)
MCV: 102.1 fL — ABNORMAL HIGH (ref 80.0–100.0)
Platelets: 488 10*3/uL — ABNORMAL HIGH (ref 150–400)
RBC: 3.33 MIL/uL — ABNORMAL LOW (ref 4.22–5.81)
RDW: 15.5 % (ref 11.5–15.5)
WBC: 16.6 10*3/uL — ABNORMAL HIGH (ref 4.0–10.5)
nRBC: 0.6 % — ABNORMAL HIGH (ref 0.0–0.2)

## 2018-09-06 LAB — GLUCOSE, CAPILLARY
GLUCOSE-CAPILLARY: 137 mg/dL — AB (ref 70–99)
Glucose-Capillary: 191 mg/dL — ABNORMAL HIGH (ref 70–99)
Glucose-Capillary: 148 mg/dL — ABNORMAL HIGH (ref 70–99)

## 2018-09-06 LAB — BASIC METABOLIC PANEL
Anion gap: 9 (ref 5–15)
BUN: 12 mg/dL (ref 8–23)
CHLORIDE: 102 mmol/L (ref 98–111)
CO2: 30 mmol/L (ref 22–32)
Calcium: 8.8 mg/dL — ABNORMAL LOW (ref 8.9–10.3)
Creatinine, Ser: 1.15 mg/dL (ref 0.61–1.24)
GFR calc Af Amer: >60 mL/min (ref >60)
GFR calc non Af Amer: >60 mL/min (ref >60)
Glucose, Bld: 158 mg/dL — ABNORMAL HIGH (ref 70–99)
Potassium: 4.2 mmol/L (ref 3.5–5.1)
Sodium: 141 mmol/L (ref 135–145)

## 2018-09-06 MED ORDER — QUETIAPINE FUMARATE 25 MG PO TABS
12.5000 mg | ORAL_TABLET | Freq: Every day | ORAL | Status: DC
  Administered 2018-09-07: 12.5 mg via ORAL
  Filled 2018-09-06 (×2): qty 1

## 2018-09-06 MED ORDER — QUETIAPINE FUMARATE 25 MG PO TABS
12.5000 mg | ORAL_TABLET | Freq: Every day | ORAL | Status: DC
  Filled 2018-09-06: qty 1

## 2018-09-06 MED ORDER — QUETIAPINE FUMARATE 25 MG PO TABS
25.0000 mg | ORAL_TABLET | Freq: Every day | ORAL | Status: DC
  Administered 2018-09-06 – 2018-09-07 (×2): 25 mg via ORAL
  Filled 2018-09-06: qty 1

## 2018-09-06 NOTE — Social Work (Signed)
CSW attempted to reach pt wife to complete assessment, no answer and unable to leave voice mail.  CSW continuing to follow for support with disposition when medically appropriate. Will attempt to reach wife later today.  Octavio Graves, MSW, [facility] Clinical Social Work [phone]

## 2018-09-06 NOTE — Discharge Instructions (Signed)

## 2018-09-06 NOTE — Progress Notes (Addendum)
STROKE TEAM PROGRESS NOTE   SUBJECTIVE (INTERVAL HISTORY)  he remains intermittently agitated and confused. Requires sitter at the bedside.  OBJECTIVE Temp:  [97.8 F (36.6 C)-98.9 F (37.2 C)] 98 F (36.7 C) (01/04 1522) Pulse Rate:  [70-100] 100 (01/04 1522) Cardiac Rhythm: (P) Atrial fibrillation (01/04 1200) Resp:  [20-24] 22 (01/04 1522) BP: (111-156)/(57-80) 141/64 (01/04 1522) SpO2:  [90 %-100 %] 90 % (01/04 1522)  Recent Labs  Lab 09/05/18 0706 09/05/18 1147 09/05/18 1703 09/05/18 2122 09/06/18 1110  GLUCAP 134* 131* 138* 116* 191*   Recent Labs  Lab 09/03/18 0206 09/03/18 1626 09/04/18 0530 09/04/18 2157 09/05/18 0308 09/06/18 0605  NA 146* 144 145 144 144 141  K 2.8* 2.8* 2.8* 3.3* 3.3* 4.2  CL 105 105 103 101 99 102  CO2 26 26 31 31  34* 30  GLUCOSE 87 155* 144* 156* 168* 158*  BUN 19 18 14 11 12 12   CREATININE 1.23 1.13 1.07 1.10 1.18 1.15  CALCIUM 8.6* 8.1* 8.0* 8.4* 8.5* 8.8*  MG 1.8  --   --  1.9 1.8  --    No results for input(s): AST, ALT, ALKPHOS, BILITOT, PROT, ALBUMIN in the last 168 hours. Recent Labs  Lab 09/02/18 0541 09/03/18 0206 09/04/18 0530 09/05/18 0308 09/06/18 0605  WBC 16.6* 19.2* 14.7* 18.6* 16.6*  HGB 9.4* 9.9* 9.3* 10.7* 11.0*  HCT 29.3* 31.3* 30.2* 33.5* 34.0*  MCV 101.7* 102.3* 101.0* 100.6* 102.1*  PLT 322 363 377 500* 488*   Recent Labs  Lab 08/31/18 1027  TROPONINI 12.47*   No results for input(s): LABPROT, INR in the last 72 hours. No results for input(s): COLORURINE, LABSPEC, PHURINE, GLUCOSEU, HGBUR, BILIRUBINUR, KETONESUR, PROTEINUR, UROBILINOGEN, NITRITE, LEUKOCYTESUR in the last 72 hours.  Invalid input(s): APPERANCEUR     Component Value Date/Time   CHOL 138 08/28/2018 0600   TRIG 171 (H) 08/28/2018 0921   HDL 24 (L) 08/28/2018 0600   CHOLHDL 5.8 08/28/2018 0600   VLDL 33 08/28/2018 0600   LDLCALC 81 08/28/2018 0600   Lab Results  Component Value Date   HGBA1C 7.1 (H) 08/28/2018   No results  found for: LABOPIA, COCAINSCRNUR, LABBENZ, AMPHETMU, THCU, LABBARB  No results for input(s): ETH in the last 168 hours.  I have personally reviewed the radiological images below and agree with the radiology interpretations.  Ct Angio Head W Or Wo Contrast  Result Date: 08/27/2018 CLINICAL DATA:  Acute presentation with speech disturbance. EXAM: CT ANGIOGRAPHY HEAD AND NECK TECHNIQUE: Multidetector CT imaging of the head and neck was performed using the standard protocol during bolus administration of intravenous contrast. Multiplanar CT image reconstructions and MIPs were obtained to evaluate the vascular anatomy. Carotid stenosis measurements (when applicable) are obtained utilizing NASCET criteria, using the distal internal carotid diameter as the denominator. CONTRAST:  75mL ISOVUE-370 IOPAMIDOL (ISOVUE-370) INJECTION 76% COMPARISON:  CT same day. FINDINGS: CTA NECK FINDINGS Aortic arch: Aortic atherosclerosis.  No aneurysm or dissection. Right carotid system: Common carotid artery shows some atherosclerotic plaque but is widely patent to the bifurcation. There is soft and calcified plaque affecting the carotid bifurcation and ICA bulb region. There is severe stenosis in the distal bulb region with luminal diameter of 1 mm. The vessel is quite tortuous beyond that but widely patent to the upper cervical region, where there is soft and calcified plaque resulting in minimal diameter of 3.5 mm. Compared to an expected diameter of 5 mm, stenosis in the bulb is 80% or greater. Stenosis in  the upper cervical region is 30%. Left carotid system: Common carotid artery shows atherosclerotic plaque but is sufficiently patent to the bifurcation region. There is calcified and soft plaque at the carotid bifurcation and ICA bulb. There is left internal carotid artery occlusion at the distal bulb. 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 The left vertebral artery shows 50% stenosis at its  origin but is sufficiently patent beyond that through the cervical region to the foramen magnum. Skeleton: Mid cervical spondylosis. Other neck: No mass or lymphadenopathy. Upper chest: Interstitial prominence which could go along with fluid overload or mild edema. Review of the MIP images confirms the above findings CTA HEAD FINDINGS Anterior circulation: Left internal carotid artery is occluded without antegrade flow through the skull base. Right internal carotid artery shows atherosclerotic disease in the carotid siphon region with stenosis estimated at 50-70%. The anterior and middle cerebral vessels are patent. There is atherosclerotic irregularity of the M1 segment with stenosis of 50-70%. No acute vessel occlusion is identified. There is a chronic punctate calcified embolus in 1 of the insular branches, but this was present in 2014. On the left, there is reconstituted flow in the distal siphon. Severe stenosis of the supraclinoid ICA, 80% or greater. Flow is present in the anterior and middle cerebral vessels, secondary to reconstituted flow and flow through communicating arteries. There is 50% stenosis in the M1 segment. I do not see any occluded or missing large or medium vessels in the MCA territory. Posterior circulation: Right vertebral artery shows no antegrade flow at the foramen magnum. There is retrograde flow in the distal right vertebral. Left vertebral artery is patent at the foramen magnum. There is atherosclerotic disease of the V4 segment with stenosis estimated at 70%. There are serial stenoses. The vessel does show flow to the basilar, which shows atherosclerotic irregularity but is patent. Flow is present in the superior cerebellar and posterior cerebral arteries. Venous sinuses: Patent and normal. Anatomic variants: None significant. Delayed phase: No abnormal enhancement. 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 Severe irregular atherosclerotic disease of the right carotid bifurcation with stenosis of 80% in the bulb region and 30% in the distal ICA. 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 50% stenosis of the left vertebral artery origin. Severe atherosclerotic disease in both carotid siphon regions. Right siphon stenosis estimated at 50-70%. Reconstituted flow in the distal left siphon and supraclinoid region. 80% supraclinoid stenosis. Flow in both anterior and middle cerebral artery territories. Stenosis of both M1 segments estimated at 50-70%. I do not identify any large or medium vessel occlusion acutely within the MCA branch vessels. Severely disease left V4 segment with serial stenoses proximal to the basilar estimated at 70%. Basilar atherosclerotic irregularity without flow limiting stenosis. Posterior circulation branch vessels do show flow, including right PICA which is supplied by retrograde flow in the distal right vertebral artery. Electronically Signed   By: Paulina FusiMark  Shogry M.D.   On: 08/27/2018 13:14   Dg Chest 2 View  Result Date: 08/27/2018 CLINICAL DATA:  Chest pain and shortness of breath.  Cardiomyopathy. EXAM: CHEST - 2 VIEW COMPARISON:  03/07/2013 FINDINGS: Mild cardiomegaly. Aortic atherosclerosis. Diffuse interstitial infiltrates, consistent with pulmonary edema. Mild subsegmental atelectasis also seen in the left upper lobe. No evidence of pulmonary consolidation or significant pleural effusion. IMPRESSION: Mild cardiomegaly and diffuse interstitial edema, consistent with congestive heart failure. Electronically Signed  By: Myles Rosenthal M.D.   On: 08/27/2018 12:02   Ct Angio Neck W Or Wo Contrast  Result Date: 08/27/2018 CLINICAL DATA:  Acute presentation with speech disturbance. EXAM: CT ANGIOGRAPHY HEAD AND NECK TECHNIQUE: Multidetector CT imaging of the head and neck was performed using the standard protocol during bolus administration of intravenous contrast. Multiplanar  CT image reconstructions and MIPs were obtained to evaluate the vascular anatomy. Carotid stenosis measurements (when applicable) are obtained utilizing NASCET criteria, using the distal internal carotid diameter as the denominator. CONTRAST:  75mL ISOVUE-370 IOPAMIDOL (ISOVUE-370) INJECTION 76% COMPARISON:  CT same day. FINDINGS: CTA NECK FINDINGS Aortic arch: Aortic atherosclerosis.  No aneurysm or dissection. Right carotid system: Common carotid artery shows some atherosclerotic plaque but is widely patent to the bifurcation. There is soft and calcified plaque affecting the carotid bifurcation and ICA bulb region. There is severe stenosis in the distal bulb region with luminal diameter of 1 mm. The vessel is quite tortuous beyond that but widely patent to the upper cervical region, where there is soft and calcified plaque resulting in minimal diameter of 3.5 mm. Compared to an expected diameter of 5 mm, stenosis in the bulb is 80% or greater. Stenosis in the upper cervical region is 30%. Left carotid system: Common carotid artery shows atherosclerotic plaque but is sufficiently patent to the bifurcation region. There is calcified and soft plaque at the carotid bifurcation and ICA bulb. There is left internal carotid artery occlusion at the distal bulb. 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 The left vertebral artery shows 50% stenosis at its origin but is sufficiently patent beyond that through the cervical region to the foramen magnum. Skeleton: Mid cervical spondylosis. Other neck: No mass or lymphadenopathy. Upper chest: Interstitial prominence which could go along with fluid overload or mild edema. Review of the MIP images confirms the above findings CTA HEAD FINDINGS Anterior circulation: Left internal carotid artery is occluded without antegrade flow through the skull base. Right internal carotid artery shows atherosclerotic disease in the carotid siphon region with  stenosis estimated at 50-70%. The anterior and middle cerebral vessels are patent. There is atherosclerotic irregularity of the M1 segment with stenosis of 50-70%. No acute vessel occlusion is identified. There is a chronic punctate calcified embolus in 1 of the insular branches, but this was present in 2014. On the left, there is reconstituted flow in the distal siphon. Severe stenosis of the supraclinoid ICA, 80% or greater. Flow is present in the anterior and middle cerebral vessels, secondary to reconstituted flow and flow through communicating arteries. There is 50% stenosis in the M1 segment. I do not see any occluded or missing large or medium vessels in the MCA territory. Posterior circulation: Right vertebral artery shows no antegrade flow at the foramen magnum. There is retrograde flow in the distal right vertebral. Left vertebral artery is patent at the foramen magnum. There is atherosclerotic disease of the V4 segment with stenosis estimated at 70%. There are serial stenoses. The vessel does show flow to the basilar, which shows atherosclerotic irregularity but is patent. Flow is present in the superior cerebellar and posterior cerebral arteries. Venous sinuses: Patent and normal. Anatomic variants: None significant. Delayed phase: No abnormal enhancement. 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 Severe irregular atherosclerotic disease of the right carotid bifurcation with stenosis of 80% in the bulb region and 30% in the distal ICA.  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 50% stenosis of the left vertebral artery origin. Severe atherosclerotic disease in both carotid siphon regions. Right siphon stenosis estimated at 50-70%. Reconstituted flow in the distal left siphon and supraclinoid region. 80% supraclinoid stenosis. Flow in both anterior and middle cerebral artery territories. Stenosis of both M1 segments  estimated at 50-70%. I do not identify any large or medium vessel occlusion acutely within the MCA branch vessels. Severely disease left V4 segment with serial stenoses proximal to the basilar estimated at 70%. Basilar atherosclerotic irregularity without flow limiting stenosis. Posterior circulation branch vessels do show flow, including right PICA which is supplied by retrograde flow in the distal right vertebral artery. Electronically Signed   By: Paulina Fusi M.D.   On: 08/27/2018 13:14   Mr Brain Wo Contrast  Result Date: 08/28/2018 CLINICAL DATA:  Follow-up examination for acute stroke, right lower extremity weakness. Known left ICA occlusion. EXAM: MRI HEAD WITHOUT CONTRAST TECHNIQUE: Multiplanar, multiecho pulse sequences of the brain and surrounding structures were obtained without intravenous contrast. COMPARISON:  Prior CTs from 08/27/2018. FINDINGS: Brain: Generalized age-related cerebral atrophy. Patchy and confluent T2/FLAIR hyperintensity within the periventricular and deep white matter both cerebral hemispheres most consistent with chronic microvascular ischemic disease, moderate nature. Scatter remote cortical infarcts involving the posterior left frontoparietal region, right parietal lobe, and right frontal lobe are seen. Small remote right cerebellar infarct. Remote lacunar infarct present at the left caudate head. Scattered chronic hemosiderin staining seen about several of these infarcts. Scattered multifocal foci of restricted diffusion involving the cortical subcortical left frontal, parietal, and temporal occipital regions, compatible with acute ischemic left MCA territory infarcts. Largest area of infarction seen at the high left frontal parietal region and measures 2.5 cm (series 3, image 48). Possible faint petechial hemorrhage about a few of these infarcts without frank hemorrhagic transformation or significant mass effect. Findings likely embolic in nature. No mass lesion, midline  shift or mass effect. Mild diffuse ventricular prominence related global parenchymal volume loss without hydrocephalus. No extra-axial fluid collection. Pituitary gland within normal limits. Vascular: Abnormal flow void within the left ICA to its cavernous segment, compatible with previously identified left ICA occlusion. Major intracranial vascular flow voids otherwise maintained at the skull base. Skull and upper cervical spine: Craniocervical junction within normal limits. Upper cervical spine normal. Bone marrow signal intensity within normal limits. No scalp soft tissue abnormality. Sinuses/Orbits: Patient status post bilateral ocular lens replacement. Globes and orbital soft tissues demonstrate no acute finding. Scattered mucosal thickening seen throughout the paranasal sinuses with superimposed scattered air-fluid levels. Fluid seen within the nasopharynx. Patient likely intubated. Trace bilateral mastoid effusions noted. Inner ear structures normal. Other: None. IMPRESSION: 1. 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 Possible faint scattered associated petechial hemorrhage without frank hemorrhagic transformation or significant mass effect. 2. Abnormal flow void within the left ICA to its cavernous segment, consistent with known left ICA occlusion. 3. 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 4. Moderately advanced cerebral atrophy with chronic small vessel ischemic disease. Electronically Signed   By: Rise Mu M.D.   On: 08/28/2018 15:33   Ct Abdomen Pelvis W Contrast  Result Date: 08/29/2018 CLINICAL DATA:  Abdominal distention.  Bloating. EXAM: CT ABDOMEN AND PELVIS WITH CONTRAST TECHNIQUE: Multidetector CT imaging of the abdomen and pelvis was performed using the standard protocol following bolus administration of intravenous contrast. CONTRAST:    OMNIPAQUE IOHEXOL 300 MG/ML  SOLN COMPARISON:  Abdominal radiograph dated 08/28/2018 and abdominal ultrasound dated 02/28/2016 FINDINGS: Lower chest: There are bilateral consolidative infiltrates in the lower lobes as well as minimal bilateral pleural effusions. Aortic atherosclerosis. Coronary artery calcifications. Hepatobiliary: Liver parenchyma is normal. No biliary ductal dilatation. Vicarious excretion of contrast in the normal appearing gallbladder. Pancreas: Unremarkable. No pancreatic ductal dilatation or surrounding inflammatory changes. Spleen: Normal in size without focal abnormality. Adrenals/Urinary Tract: Adrenal glands are normal. Normal right kidney. Large chronic cyst in the lower pole of the left kidney with some calcification in the wall. Maximum diameter of the cyst is approximately 9 cm. Stomach/Bowel: NG tube tip in the body of the stomach. There are few diverticula in the proximal sigmoid portion of the colon. There is moderate air in the colon without significant distention. Small bowel appears normal. Fluid and air in the stomach. Appendix has been removed. Vascular/Lymphatic: There is a poorly defined hematoma in the right inguinal region in the panniculus. This measures approximately 12 x 5 x 2 cm and is probably related to recent angiography. There are several small lymph nodes in the right inguinal region, the largest being 15 mm in diameter. Extensive aortic atherosclerosis. No adenopathy. Reproductive: Dense calcification in the normal-sized prostate gland. Other: There is a small amount of ascites in the abdomen, nonspecific. Small periumbilical hernia containing only fat. Edema in the subcutaneous fat of both flanks, right greater than left. Slight edema in the panniculus and in the lateral aspect of both thighs. Musculoskeletal: No acute abnormalities. Multilevel degenerative disc disease in the thoracic spine and lumbar spine. IMPRESSION: 1. Small hematoma in the right  inguinal region in the panniculus, probably secondary to the recent angiography. 2. Bilateral consolidative infiltrates in the posterior aspect of both lower lobes with tiny adjacent pleural effusions. 3. Small amount of nonspecific ascites. 4. Slightly prominent gas in the bowel without evidence of bowel obstruction. This probably represents an ileus. Electronically Signed   By: Francene Boyers M.D.   On: 08/29/2018 17:03   Ir Ct Head Ltd  Result Date: 08/31/2018 CLINICAL DATA:  New onset right-sided weakness with mild word-finding difficulties. 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 EXAM: IR ANGIO INTRA EXTRACRAN SEL COM CAROTID INNOMINATE UNI RIGHT MOD SED; IR PERCUTANEOUS ART THORMBECTOMY/INFUSION INTRACRANIAL INCLUDE DIAG ANGIO; IR ANGIO VERTEBRAL SEL SUBCLAVIAN INNOMINATE UNI LEFT MOD SED; IR CT HEAD LIMITED COMPARISON:  CT angiogram of the head and neck of 08/27/2018 MEDICATIONS: Heparin 3,000 units IV; . Ancef 2 g IV antibiotic was administered within 1 hour of the procedure ANESTHESIA/SEDATION: General anesthesia CONTRAST:  Isovue 300 approximately 100 mL FLUOROSCOPY TIME:  Fluoroscopy Time: 60 minutes 0 seconds (4327 mGy). COMPLICATIONS: None immediate. TECHNIQUE: Informed written consent was obtained from the patient after a thorough discussion of the procedural risks, benefits and alternatives. All questions were addressed. Maximal Sterile Barrier Technique was utilized including caps, mask, sterile gowns, sterile gloves, sterile drape, hand hygiene and skin antiseptic. A timeout was performed prior to the initiation of the procedure. The right groin was prepped and draped in the usual sterile fashion. Thereafter using modified Seldinger technique, transfemoral access into the right common femoral artery was obtained without  difficulty. Over a 0.035 inch guidewire, a 5 French Pinnacle sheath was inserted. Through this, and also over 0.035 inch guidewire,  a 5 [place] JB 1 catheter was advanced to the aortic arch region and selectively positioned in the right common carotid artery, the innominate artery , the left common carotid artery and the left vertebral artery. FINDINGS: The left common carotid artery tear g demonstrates a moderate tortuosity of the proximal left common carotid artery The left external carotid artery proximally has a mild to moderate stenosis. Is branches are otherwise normally opacified The left internal carotid artery at the bulb would demonstrates the complete angiographic occlusion with no evidence of a string sign on the delayed arterial images. The innominate artery arteriogram demonstrates patency of the right subclavian artery and the right common carotid artery proximally There is no angiographic evidence of the right vertebral artery The right common carotid bifurcation demonstrates the right external carotid artery and its major branches to be widely patent The right internal carotid artery just distal to the bulb has a severe 90% plus stenosis. Distal to this there is a U-shaped configuration of the right internal carotid artery. More distally the vessel is normal caliber The petrous the cavernous segments and supraclinoid segments are widely patent There is mild atherosclerotic irregularity involving the proximal cavernous, and distal cavernous portions of the right internal carotid artery. Right posterior communicating artery is seen opacifying the right posterior cerebral artery and the distal basilar artery. The right middle cerebral artery has approximately 30 40% stenosis of the right M1 segment The trifurcation branches appear to be widely patent into the capillary and venous phases The right anterior cerebral artery A1 segment and distally demonstrate wide patency is into the capillary and venous  phases There is simultaneous cross-filling via the anterior communicating artery of the left anterior cerebral artery A2 segment and distally. Also demonstrated is opacification of the left anterior cerebral A1 segment, with flow noted into the left middle cerebral artery distribution. Unopacified blood is seen in the left middle cerebral artery from the collateral circulation. 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 More distally the vessel is seen to opacify to the cranial skull base There is severe focal of focal stenosis of the left vertebrobasilar junction just distal to the origin of the left posterior-inferior cerebellar artery Additionally there is a approximately 70% stenosis of the proximal basilar artery More distally the basilar artery demonstrates mild caliber irregularity. There is a opacification of the left posterior cerebral artery the superior cerebellar arteries left greater than right, and the anterior-inferior cerebellar arteries Retrograde opacification of the right vertebrobasilar junction to the level of the right posterior-inferior cerebellar arteries is noted. Caliber irregularity of the right posterior-inferior cerebellar artery suggests intracranial arteriosclerosis. Left internal carotid artery occlusion or revascularization attempt The diagnostic JB 1 catheter left common carotid artery was then exchanged over a 0.035 inch 300 cm a 7 French Pinnacle sheath in the right groin. A diagnostic JB 1 catheter were then advanced over the rise exchange guidewire to the descending thoracic aorta. The 7 French Pinnacle sheath was then exchanged over a 0.035 inch 100 cm Amplatz stiff guidewire for a a 8 French  55 cm Brite tip neurovascular sheath using biplane roadmap technique and constant fluoroscopic guidance. Good good aspiration was obtained from the side port of the neurovascular sheath. This was then connected to  continuous heparinized saline infusion. The embolus guidewire was then removed. Through the 8 French neurovascular sheath, over a 0.035 inch roadrunner guidewire, a 5 5 [place] JB 1 catheter was advanced to the recover region and select position in the left common carotid artery. Using biplane technique and constant fluoroscopic guidance, over the of the 5 inch roadrunner guidewire, the 5 [place] JB 1 catheter was advanced to the left external carotid artery and exchanged over a 0.035 inch 300 cm rise exchange guidewire for an 8 French 85 cm flow gap balloon guide catheter which had been prepped with 50% contrast and 50% heparinized saline infusion The guidewire was removed. Good aspiration obtained from the hub of the 8 [place] Flo guide catheter. Gentle contrast injection demonstrated no evidence of spasms dissections or of intraluminal filling defects. At this time, in a coaxial manner and with constant heparinized saline infusion, a tree approached 1 microcatheter was advanced over a point 0.014 inch soft tip synchro micro guidewire to the distal end of the Flo guide catheter The Flood guide catheter was advanced to just inside the left internal carotid artery bulb. Thereafter, multiple attempts were made to advance the microcatheter over the micro guidewire first with Ace synchro soft and then a regular synchro guidewire. In 016 headliner double angled micro guidewire was also utilized in order to access the occluded left internal carotid artery. It was noted that there was a severe tortuosity at the junction of the proximal and the middle 1/3 of the left internal carotid artery Following multiple attempts it was decided to stop. A control was then performed through the [facility] guide catheter in the left  common carotid artery which revealed brisk cross-filling of the left internal carotid artery distal cavernous and supraclinoid segments from the left external carotid artery branches via the ipsilateral ophthalmic artery Flow is noted into the left middle cerebral artery as well Also noted was a retrograde opacification from the left posterior communicating artery into the supraclinoid left ICA and then middle cerebral artery An earlier diagnostic catheter arteriogram of the right common carotid artery he had revealed partial cross-filling via the anterior communicating artery from the right internal carotid artery Given the the collateral circulation, it was decided to stop the procedure. Throughout the procedure, the patient's blood pressure and neurological status remained stable No evidence of hemodynamic instability was noted No gross mass-effect or move filling defects or extravasation was seen Prior to the attempted revascularization of the left internal carotid artery occlusion, the patient was given 180 mg of Brilinta, and 81 mg of aspirin orally the an orogastric tube. Also the patient was loaded with 3000 units IV heparin in order to facilitate revascularization. The the 8 [place] flu guide catheter was then retrieved and removed. The 8 [place] Brite tip neurovascular sheath was then exchanged over a J-tip guidewire for a in 8 French 35 cm Brite tip neurovascular sheath. This was then connected to continuous heparinized saline infusion An arteriogram performed through this 3 5 cm 8 French Brite tip neurovascular sheath in the abdominal aorta reveal excellent flow through the common iliacs, the external and internal iliacs, and also the visualized portions of the common femoral arteries below the level of the inguinal ligaments. At the end of procedure, a CT of the brain revealed no evidence of gross intracranial hemorrhage,, or mass effect or midline shift. The patient was left intubated on account of his  wound at the time of his intubation The right groin appeared soft without evidence  of a hematoma. Distal pulses in the dorsalis pedis, and posterior regions remained dopplerable bilaterally. The patient was then transferred to the neuro ICU for further post stroke management. IMPRESSION: . 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 Approximately 90% stenosis of the dominant left vertebrobasilar junction distal to the origin of the left posterior-inferior cerebellar artery, and of approximately 70% of the proximal basilar artery. 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 PLAN: Follow-up in the clinic 1 month post discharge Electronically Signed   By: Julieanne Cotton M.D.   On: 08/28/2018 16:08   Ct Cerebral Perfusion W Contrast  Result Date: 08/27/2018 CLINICAL DATA:  Right-sided weakness onset this morning. EXAM: CT PERFUSION BRAIN TECHNIQUE: Multiphase CT imaging of the brain was performed following IV bolus contrast injection. Subsequent parametric perfusion maps were calculated using RAPID software. CONTRAST:  40mL ISOVUE-370 IOPAMIDOL (ISOVUE-370) INJECTION 76% COMPARISON:  CT a head today FINDINGS: CT Brain Perfusion Findings: CBF (<30%) Volume: 0mL Perfusion (Tmax>6.0s) volume: Mismatch Volume: ASPECTS on noncontrast CT Head today, not calculated due to extensive chronic ischemic change. No definite acute infarct on CT. Infarct Core: 0 mL Infarction Location:Delayed perfusion throughout the left MCA territory with apparent sparing of the basal ganglia. There is occlusion of the left internal carotid artery on CTA, age indeterminate. Left middle cerebral artery is diseased and may have delayed perfusion due to collateral circulation and stenosis. IMPRESSION: 155 mL of delayed perfusion in the left MCA territory. No core  infarction. Delayed perfusion left MCA territory may be due to severe intracranial atherosclerotic disease as well as occlusion of the left internal carotid artery. Age of the internal carotid artery occlusion is indeterminate. It is possible this delayed perfusion is a chronic or possibly acute finding. Electronically Signed   By: Marlan Palau M.D.   On: 08/27/2018 16:12   Dg Chest Port 1 View  Result Date: 09/03/2018 CLINICAL DATA:  Shortness of breath. EXAM: PORTABLE CHEST 1 VIEW COMPARISON:  Radiograph September 02, 2018. FINDINGS: Stable cardiomegaly. Atherosclerosis of thoracic aorta is noted. No pneumothorax is noted. Right internal jugular catheter is unchanged in position. Stable bilateral pulmonary edema is noted with minimal pleural effusions. Bony thorax is unremarkable. IMPRESSION: Stable cardiomegaly with bilateral pulmonary edema and minimal pleural effusions. Aortic Atherosclerosis (ICD10-I70.0). Electronically Signed   By: Lupita Raider, M.D.   On: 09/03/2018 12:33   Dg Chest Port 1 View  Result Date: 09/02/2018 CLINICAL DATA:  Acute respiratory distress. EXAM: PORTABLE CHEST 1 VIEW COMPARISON:  One-view chest x-ray 09/01/2018 FINDINGS: The heart is enlarged. Patient has been extubated. NG tube was removed. A right IJ line remains. Atherosclerotic changes are present at the aortic arch. Diffuse interstitial edema is stable. Bibasilar airspace disease is worse on the left. IMPRESSION: 1. Interval extubation and removal of NG tube. 2. Stable cardiomegaly and edema consistent with congestive heart failure. 3. Aortic atherosclerosis. Electronically Signed   By: Marin Roberts M.D.   On: 09/02/2018 07:29   Dg Chest Port 1 View  Result Date: 09/01/2018 CLINICAL DATA:  Respiratory failure. EXAM: PORTABLE CHEST 1 VIEW COMPARISON:  08/31/2018 FINDINGS: Endotracheal tube terminates 4.2 cm above the carina. Right jugular catheter terminates over the mid SVC. Enteric tube courses into the  left upper abdomen. The cardiac silhouette remains enlarged. Patchy airspace opacities throughout both lungs have not significantly changed. IMPRESSION: Bilateral airspace disease without significant  interval change. Electronically Signed   By: Sebastian Ache M.D.   On: 09/01/2018 06:54   Dg Chest Port 1 View  Result Date: 08/31/2018 CLINICAL DATA:  CXR for Respiratory failure. Hx of HTN, stroke, AAA, Diabetes, and Cardiomyopathy. EXAM: PORTABLE CHEST 1 VIEW COMPARISON:  08/30/2018 FINDINGS: Endotracheal tube is in place, tip 3.8 centimeters above the carina. Nasogastric tube is in place, tip beyond the gastroesophageal junction and off the image. A RIGHT IJ central line tip overlies the superior vena cava. Patient is rotated towards the LEFT. The heart is enlarged. There is persistent airspace filling opacity throughout the RIGHT lung, slightly improved. There is significant opacity in the LEFT lung base, consistent with atelectasis or consolidation and increased since 08/27/2018. IMPRESSION: 1. Slight improvement in aeration of the RIGHT lung. 2. Increased opacity in the LEFT lung base. Electronically Signed   By: Norva Pavlov M.D.   On: 08/31/2018 09:52   Dg Chest Port 1 View  Result Date: 08/30/2018 CLINICAL DATA:  Respiratory failure. Cardiomyopathy. EXAM: PORTABLE CHEST 1 VIEW COMPARISON:  08/29/2018 and 08/27/2018 FINDINGS: Endotracheal tube and NG tube and central line appear unchanged and in good position. There has been progression of the bilateral diffuse pulmonary infiltrates, particularly in the right upper lung zone of the left lung base. Heart size and pulmonary vascularity are normal. No discrete effusions. Aortic atherosclerosis. No acute bone abnormality. IMPRESSION: Progressive bilateral pulmonary infiltrates. Aortic Atherosclerosis (ICD10-I70.0). Electronically Signed   By: Francene Boyers M.D.   On: 08/30/2018 08:48   Dg Chest Port 1 View  Result Date: 08/29/2018 CLINICAL DATA:   Central line placement EXAM: PORTABLE CHEST 1 VIEW COMPARISON:  08/29/2018 FINDINGS: Right central line is been placed with the tip in the SVC. No pneumothorax. Endotracheal tube and NG tube are unchanged. Cardiomegaly with vascular congestion and mild pulmonary edema, stable. IMPRESSION: Right central line tip in the SVC. No pneumothorax. Continued mild CHF. Electronically Signed   By: Charlett Nose M.D.   On: 08/29/2018 11:58   Dg Chest Port 1 View  Result Date: 08/29/2018 CLINICAL DATA:  PT presented to [facility] ED on 08/27/18 with complaints of right lower extremity weakness, dyspnea and chest pressure. Hx of stroke, Peripheral vascualar disease, cardiomyopathy, and A-fib. EXAM: PORTABLE CHEST 1 VIEW COMPARISON:  08/27/2018 FINDINGS: Mild enlargement of the cardiac silhouette, stable. No mediastinal or hilar masses. There is central vascular congestion. Mild hazy opacity noted in the perihilar regions and medial lung bases bilaterally. No convincing pleural effusion and no pneumothorax. Endotracheal tube tip projects 3.8 cm above the Carina. Nasogastric tube passes below the diaphragm well into the stomach. IMPRESSION: 1. Findings are consistent with mild congestive heart failure with evidence of slight improvement when compared to 08/27/2018. No new abnormalities. 2. Support apparatus is well positioned. Electronically Signed   By: Amie Portland M.D.   On: 08/29/2018 07:26   Dg Chest Port 1 View  Result Date: 08/28/2018 CLINICAL DATA:  Shortness of Breath EXAM: PORTABLE CHEST 1 VIEW COMPARISON:  08/27/2018 FINDINGS: Cardiac shadow is enlarged in size. Aortic calcifications are noted. Endotracheal tube is noted at the level of the carina. This should be withdrawn 2-3 cm. Nasogastric catheter is noted within the stomach. Lungs are well aerated bilaterally with mild interstitial edema similar to that seen on the prior exam. IMPRESSION: Interval intubation with the endotracheal tube at the level of the carina.  This should be withdrawn 2-3 cm. Stable edema bilaterally. These results will be called to the ordering  clinician or representative by the Radiologist Assistant, and communication documented in the PACS or zVision Dashboard. Electronically Signed   By: Alcide Clever M.D.   On: 08/28/2018 00:19   Dg Abd Portable 1v  Result Date: 08/28/2018 CLINICAL DATA:  Orogastric tube placement. EXAM: PORTABLE ABDOMEN - 1 VIEW COMPARISON:  None. FINDINGS: The patient's enteric tube is seen ending overlying the body of the stomach, with the side port about the fundus of the stomach. The visualized bowel gas pattern is grossly unremarkable. A small left pleural effusion is noted. Increased interstitial markings raise concern for pulmonary edema. Vascular congestion is noted. The patient's endotracheal tube is seen ending 2-3 cm above the carina. No acute osseous abnormalities are identified. IMPRESSION: 1. Enteric tube seen ending overlying the body of the stomach, with the side port about the fundus of the stomach. 2. Small left pleural effusion noted. Increased interstitial markings raise concern for pulmonary edema. Electronically Signed   By: Roanna Raider M.D.   On: 08/28/2018 22:08   Ir Percutaneous Art Thrombectomy/infusion Intracranial Inc Diag Angio  Result Date: 08/31/2018 CLINICAL DATA:  New onset right-sided weakness with mild word-finding difficulties. 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 EXAM: IR ANGIO INTRA EXTRACRAN SEL COM CAROTID INNOMINATE UNI RIGHT MOD SED; IR PERCUTANEOUS ART THORMBECTOMY/INFUSION INTRACRANIAL INCLUDE DIAG ANGIO; IR ANGIO VERTEBRAL SEL SUBCLAVIAN INNOMINATE UNI LEFT MOD SED; IR CT HEAD LIMITED COMPARISON:  CT angiogram of the head and neck of 08/27/2018 MEDICATIONS: Heparin 3,000 units IV; . Ancef 2 g IV antibiotic was  administered within 1 hour of the procedure ANESTHESIA/SEDATION: General anesthesia CONTRAST:  Isovue 300 approximately 100 mL FLUOROSCOPY TIME:  Fluoroscopy Time: 60 minutes 0 seconds (4327 mGy). COMPLICATIONS: None immediate. TECHNIQUE: Informed written consent was obtained from the patient after a thorough discussion of the procedural risks, benefits and alternatives. All questions were addressed. Maximal Sterile Barrier Technique was utilized including caps, mask, sterile gowns, sterile gloves, sterile drape, hand hygiene and skin antiseptic. A timeout was performed prior to the initiation of the procedure. The right groin was prepped and draped in the usual sterile fashion. Thereafter using modified Seldinger technique, transfemoral access into the right common femoral artery was obtained without difficulty. Over a 0.035 inch guidewire, a 5 French Pinnacle sheath was inserted. Through this, and also over 0.035 inch guidewire, a 5 [place] JB 1 catheter was advanced to the aortic arch region and selectively positioned in the right common carotid artery, the innominate artery , the left common carotid artery and the left vertebral artery. FINDINGS: The left common carotid artery tear g demonstrates a moderate tortuosity of the proximal left common carotid artery The left external carotid artery proximally has a mild to moderate stenosis. Is branches are otherwise normally opacified The left internal carotid artery at the bulb would demonstrates the complete angiographic occlusion with no evidence of a string sign on the delayed arterial images. The innominate artery arteriogram demonstrates patency of the right subclavian artery and the right common carotid artery proximally There is no angiographic evidence of the right vertebral artery The right common carotid bifurcation demonstrates the right external carotid artery and its major branches to be widely patent The right internal carotid artery just distal to the  bulb has a severe 90% plus stenosis. Distal to this there is a U-shaped configuration of  the right internal carotid artery. More distally the vessel is normal caliber The petrous the cavernous segments and supraclinoid segments are widely patent There is mild atherosclerotic irregularity involving the proximal cavernous, and distal cavernous portions of the right internal carotid artery. Right posterior communicating artery is seen opacifying the right posterior cerebral artery and the distal basilar artery. The right middle cerebral artery has approximately 30 40% stenosis of the right M1 segment The trifurcation branches appear to be widely patent into the capillary and venous phases The right anterior cerebral artery A1 segment and distally demonstrate wide patency is into the capillary and venous phases There is simultaneous cross-filling via the anterior communicating artery of the left anterior cerebral artery A2 segment and distally. Also demonstrated is opacification of the left anterior cerebral A1 segment, with flow noted into the left middle cerebral artery distribution. Unopacified blood is seen in the left middle cerebral artery from the collateral circulation. 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 More distally the vessel is seen to opacify to the cranial skull base There is severe focal of focal stenosis of the left vertebrobasilar junction just distal to the origin of the left posterior-inferior cerebellar artery Additionally there is a approximately 70% stenosis of the proximal basilar artery More distally the basilar artery demonstrates mild caliber irregularity. There is a opacification of the left posterior  cerebral artery the superior cerebellar arteries left greater than right, and the anterior-inferior cerebellar arteries Retrograde opacification of the right vertebrobasilar junction to the level of the right posterior-inferior cerebellar arteries is noted. Caliber irregularity of the right posterior-inferior cerebellar artery suggests intracranial arteriosclerosis. Left internal carotid artery occlusion or revascularization attempt The diagnostic JB 1 catheter left common carotid artery was then exchanged over a 0.035 inch 300 cm a 7 French Pinnacle sheath in the right groin. A diagnostic JB 1 catheter were then advanced over the rise exchange guidewire to the descending thoracic aorta. The 7 French Pinnacle sheath was then exchanged over a 0.035 inch 100 cm Amplatz stiff guidewire for a a 8 French 55 cm Brite tip neurovascular sheath using biplane roadmap technique and constant fluoroscopic guidance. Good good aspiration was obtained from the side port of the neurovascular sheath. This was then connected to continuous heparinized saline infusion. The embolus guidewire was then removed. Through the 8 French neurovascular sheath, over a 0.035 inch roadrunner guidewire, a 5 5 [place] JB 1 catheter was advanced to the recover region and select position in the left common carotid artery. Using biplane technique and constant fluoroscopic guidance, over the of the 5 inch roadrunner guidewire, the 5 [place] JB 1 catheter was advanced to the left external carotid artery and exchanged over a 0.035 inch 300 cm rise exchange guidewire for an 8 French 85 cm flow gap balloon guide catheter which had been prepped with 50% contrast and 50% heparinized saline infusion The guidewire was removed. Good aspiration obtained from the hub of the 8 [place] Flo guide catheter. Gentle contrast injection demonstrated no evidence of spasms dissections or of intraluminal filling defects. At this time, in a coaxial manner and with constant  heparinized saline infusion, a tree approached 1 microcatheter was advanced over a point 0.014 inch soft tip synchro micro guidewire to the distal  end of the Flo guide catheter The Flood guide catheter was advanced to just inside the left internal carotid artery bulb. Thereafter, multiple attempts were made to advance the microcatheter over the micro guidewire first with Ace synchro soft and then a regular synchro guidewire. In 016 headliner double angled micro guidewire was also utilized in order to access the occluded left internal carotid artery. It was noted that there was a severe tortuosity at the junction of the proximal and the middle 1/3 of the left internal carotid artery Following multiple attempts it was decided to stop. A control was then performed through the [facility] guide catheter in the left common carotid artery which revealed brisk cross-filling of the left internal carotid artery distal cavernous and supraclinoid segments from the left external carotid artery branches via the ipsilateral ophthalmic artery Flow is noted into the left middle cerebral artery as well Also noted was a retrograde opacification from the left posterior communicating artery into the supraclinoid left ICA and then middle cerebral artery An earlier diagnostic catheter arteriogram of the right common carotid artery he had revealed partial cross-filling via the anterior communicating artery from the right internal carotid artery Given the the collateral circulation, it was decided to stop the procedure. Throughout the procedure, the patient's blood pressure and neurological status remained stable No evidence of hemodynamic instability was noted No gross mass-effect or move filling defects or extravasation was seen Prior to the attempted revascularization of the left internal carotid artery occlusion, the patient was given 180 mg of Brilinta, and 81 mg of aspirin orally the an orogastric tube. Also the patient was loaded with 3000  units IV heparin in order to facilitate revascularization. The the 8 [place] flu guide catheter was then retrieved and removed. The 8 [place] Brite tip neurovascular sheath was then exchanged over a J-tip guidewire for a in 8 French 35 cm Brite tip neurovascular sheath. This was then connected to continuous heparinized saline infusion An arteriogram performed through this 3 5 cm 8 French Brite tip neurovascular sheath in the abdominal aorta reveal excellent flow through the common iliacs, the external and internal iliacs, and also the visualized portions of the common femoral arteries below the level of the inguinal ligaments. At the end of procedure, a CT of the brain revealed no evidence of gross intracranial hemorrhage,, or mass effect or midline shift. The patient was left intubated on account of his wound at the time of his intubation The right groin appeared soft without evidence of a hematoma. Distal pulses in the dorsalis pedis, and posterior regions remained dopplerable bilaterally. The patient was then transferred to the neuro ICU for further post stroke management. IMPRESSION: . 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 Approximately 90% stenosis of the dominant left vertebrobasilar junction distal to the origin of the left posterior-inferior cerebellar artery, and of approximately 70% of the proximal basilar artery. 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 PLAN: Follow-up in the clinic 1 month post discharge Electronically Signed   By: Julieanne Cotton M.D.   On: 08/28/2018 16:08   Ct Head Code Stroke Wo Contrast`  Result Date: 08/27/2018 CLINICAL DATA:  Code stroke. Slurred speech beginning 3 weeks ago. Right-sided weakness beginning today. EXAM: CT HEAD WITHOUT CONTRAST TECHNIQUE: Contiguous axial images were obtained from the  base of the skull through the vertex without intravenous contrast. COMPARISON:  CT 03/07/2013 FINDINGS: Brain:  The brainstem and cerebellum appear normal by CT. Cerebral hemispheres show age related volume loss. There are old cortical and subcortical infarctions in the right frontal lobe, left parietal vertex, left frontal lobe, and left frontoparietal vertex. The show atrophy and encephalomalacia with gliosis. There is no finding of acute or subacute infarction on this CT. There is old lacunar infarction in the left caudate. Vascular: There is atherosclerotic calcification of the major vessels at the base of the brain. No evidence of acute hyperdense vessel. Skull: Negative Sinuses/Orbits: Clear/normal Other: None ASPECTS ([place] Stroke Program Early CT Score) Aspects is difficult in this patient with old infarctions. I do not suspect an acute insult. IMPRESSION: 1. No acute finding by CT. 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 2. 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 No acute finding is suspected by CT. 3. These results were called by telephone at the time of interpretation on 08/27/2018 at 11:47 am to Dr. Daryel November , who verbally acknowledged these results. Electronically Signed   By: Paulina Fusi M.D.   On: 08/27/2018 11:49   Ir Angio Intra Extracran Sel Com Carotid Innominate Uni R Mod Sed  Result Date: 08/31/2018 CLINICAL DATA:  New onset right-sided weakness with mild word-finding difficulties. 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 EXAM: IR ANGIO INTRA EXTRACRAN SEL COM CAROTID INNOMINATE UNI RIGHT MOD SED; IR PERCUTANEOUS ART THORMBECTOMY/INFUSION INTRACRANIAL INCLUDE DIAG ANGIO; IR ANGIO VERTEBRAL SEL  SUBCLAVIAN INNOMINATE UNI LEFT MOD SED; IR CT HEAD LIMITED COMPARISON:  CT angiogram of the head and neck of 08/27/2018 MEDICATIONS: Heparin 3,000 units IV; . Ancef 2 g IV antibiotic was administered within 1 hour of the procedure ANESTHESIA/SEDATION: General anesthesia CONTRAST:  Isovue 300 approximately 100 mL FLUOROSCOPY TIME:  Fluoroscopy Time: 60 minutes 0 seconds (4327 mGy). COMPLICATIONS: None immediate. TECHNIQUE: Informed written consent was obtained from the patient after a thorough discussion of the procedural risks, benefits and alternatives. All questions were addressed. Maximal Sterile Barrier Technique was utilized including caps, mask, sterile gowns, sterile gloves, sterile drape, hand hygiene and skin antiseptic. A timeout was performed prior to the initiation of the procedure. The right groin was prepped and draped in the usual sterile fashion. Thereafter using modified Seldinger technique, transfemoral access into the right common femoral artery was obtained without difficulty. Over a 0.035 inch guidewire, a 5 French Pinnacle sheath was inserted. Through this, and also over 0.035 inch guidewire, a 5 [place] JB 1 catheter was advanced to the aortic arch region and selectively positioned in the right common carotid artery, the innominate artery , the left common carotid artery and the left vertebral artery. FINDINGS: The left common carotid artery tear g demonstrates a moderate tortuosity of the proximal left common carotid artery The left external carotid artery proximally has a mild to moderate stenosis. Is branches are otherwise normally opacified The left internal carotid artery at the bulb would demonstrates the complete angiographic occlusion with no evidence of a string sign on the delayed arterial images. The innominate artery arteriogram demonstrates patency of the right subclavian artery and the right common carotid artery proximally There is no angiographic evidence of the right vertebral  artery The right common carotid bifurcation demonstrates the right external carotid artery and its major branches to be  widely patent The right internal carotid artery just distal to the bulb has a severe 90% plus stenosis. Distal to this there is a U-shaped configuration of the right internal carotid artery. More distally the vessel is normal caliber The petrous the cavernous segments and supraclinoid segments are widely patent There is mild atherosclerotic irregularity involving the proximal cavernous, and distal cavernous portions of the right internal carotid artery. Right posterior communicating artery is seen opacifying the right posterior cerebral artery and the distal basilar artery. The right middle cerebral artery has approximately 30 40% stenosis of the right M1 segment The trifurcation branches appear to be widely patent into the capillary and venous phases The right anterior cerebral artery A1 segment and distally demonstrate wide patency is into the capillary and venous phases There is simultaneous cross-filling via the anterior communicating artery of the left anterior cerebral artery A2 segment and distally. Also demonstrated is opacification of the left anterior cerebral A1 segment, with flow noted into the left middle cerebral artery distribution. Unopacified blood is seen in the left middle cerebral artery from the collateral circulation. 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 More distally the vessel is seen to opacify to the cranial skull base There is severe focal of focal stenosis of the left vertebrobasilar junction just distal to the origin of the left posterior-inferior cerebellar artery Additionally  there is a approximately 70% stenosis of the proximal basilar artery More distally the basilar artery demonstrates mild caliber irregularity. There is a opacification of the left posterior cerebral artery the superior cerebellar arteries left greater than right, and the anterior-inferior cerebellar arteries Retrograde opacification of the right vertebrobasilar junction to the level of the right posterior-inferior cerebellar arteries is noted. Caliber irregularity of the right posterior-inferior cerebellar artery suggests intracranial arteriosclerosis. Left internal carotid artery occlusion or revascularization attempt The diagnostic JB 1 catheter left common carotid artery was then exchanged over a 0.035 inch 300 cm a 7 French Pinnacle sheath in the right groin. A diagnostic JB 1 catheter were then advanced over the rise exchange guidewire to the descending thoracic aorta. The 7 French Pinnacle sheath was then exchanged over a 0.035 inch 100 cm Amplatz stiff guidewire for a a 8 French 55 cm Brite tip neurovascular sheath using biplane roadmap technique and constant fluoroscopic guidance. Good good aspiration was obtained from the side port of the neurovascular sheath. This was then connected to continuous heparinized saline infusion. The embolus guidewire was then removed. Through the 8 French neurovascular sheath, over a 0.035 inch roadrunner guidewire, a 5 5 [place] JB 1 catheter was advanced to the recover region and select position in the left common carotid artery. Using biplane technique and constant fluoroscopic guidance, over the of the 5 inch roadrunner guidewire, the 5 [place] JB 1 catheter was advanced to the left external carotid artery and exchanged over a 0.035 inch 300 cm rise exchange guidewire for an 8 French 85 cm flow gap balloon guide catheter which had been prepped with 50% contrast and 50% heparinized saline infusion The guidewire was removed. Good aspiration obtained from the hub of the 8 [place]  Flo guide catheter. Gentle contrast injection demonstrated no evidence of spasms dissections or of intraluminal filling defects. At this time, in a coaxial  manner and with constant heparinized saline infusion, a tree approached 1 microcatheter was advanced over a point 0.014 inch soft tip synchro micro guidewire to the distal end of the Flo guide catheter The Flood guide catheter was advanced to just inside the left internal carotid artery bulb. Thereafter, multiple attempts were made to advance the microcatheter over the micro guidewire first with Ace synchro soft and then a regular synchro guidewire. In 016 headliner double angled micro guidewire was also utilized in order to access the occluded left internal carotid artery. It was noted that there was a severe tortuosity at the junction of the proximal and the middle 1/3 of the left internal carotid artery Following multiple attempts it was decided to stop. A control was then performed through the [facility] guide catheter in the left common carotid artery which revealed brisk cross-filling of the left internal carotid artery distal cavernous and supraclinoid segments from the left external carotid artery branches via the ipsilateral ophthalmic artery Flow is noted into the left middle cerebral artery as well Also noted was a retrograde opacification from the left posterior communicating artery into the supraclinoid left ICA and then middle cerebral artery An earlier diagnostic catheter arteriogram of the right common carotid artery he had revealed partial cross-filling via the anterior communicating artery from the right internal carotid artery Given the the collateral circulation, it was decided to stop the procedure. Throughout the procedure, the patient's blood pressure and neurological status remained stable No evidence of hemodynamic instability was noted No gross mass-effect or move filling defects or extravasation was seen Prior to the attempted  revascularization of the left internal carotid artery occlusion, the patient was given 180 mg of Brilinta, and 81 mg of aspirin orally the an orogastric tube. Also the patient was loaded with 3000 units IV heparin in order to facilitate revascularization. The the 8 [place] flu guide catheter was then retrieved and removed. The 8 [place] Brite tip neurovascular sheath was then exchanged over a J-tip guidewire for a in 8 French 35 cm Brite tip neurovascular sheath. This was then connected to continuous heparinized saline infusion An arteriogram performed through this 3 5 cm 8 French Brite tip neurovascular sheath in the abdominal aorta reveal excellent flow through the common iliacs, the external and internal iliacs, and also the visualized portions of the common femoral arteries below the level of the inguinal ligaments. At the end of procedure, a CT of the brain revealed no evidence of gross intracranial hemorrhage,, or mass effect or midline shift. The patient was left intubated on account of his wound at the time of his intubation The right groin appeared soft without evidence of a hematoma. Distal pulses in the dorsalis pedis, and posterior regions remained dopplerable bilaterally. The patient was then transferred to the neuro ICU for further post stroke management. IMPRESSION: . 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 Approximately 90% stenosis of the dominant left vertebrobasilar junction distal to the origin of the left posterior-inferior cerebellar artery, and of approximately 70% of the proximal basilar artery. 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 PLAN: Follow-up in the clinic 1 month post discharge Electronically Signed   By: Julieanne Cotton M.D.   On: 08/28/2018 16:08   Ir Angio Vertebral Sel Subclavian Innominate Uni L Mod  Sed  Result Date: 08/31/2018 CLINICAL DATA:  New onset right-sided weakness with mild word-finding difficulties. 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 EXAM: IR ANGIO INTRA EXTRACRAN SEL COM CAROTID INNOMINATE UNI RIGHT MOD SED; IR PERCUTANEOUS ART THORMBECTOMY/INFUSION INTRACRANIAL INCLUDE DIAG ANGIO; IR ANGIO VERTEBRAL SEL SUBCLAVIAN INNOMINATE UNI LEFT MOD SED; IR CT HEAD LIMITED COMPARISON:  CT angiogram of the head and neck of 08/27/2018 MEDICATIONS: Heparin 3,000 units IV; . Ancef 2 g IV antibiotic was administered within 1 hour of the procedure ANESTHESIA/SEDATION: General anesthesia CONTRAST:  Isovue 300 approximately 100 mL FLUOROSCOPY TIME:  Fluoroscopy Time: 60 minutes 0 seconds (4327 mGy). COMPLICATIONS: None immediate. TECHNIQUE: Informed written consent was obtained from the patient after a thorough discussion of the procedural risks, benefits and alternatives. All questions were addressed. Maximal Sterile Barrier Technique was utilized including caps, mask, sterile gowns, sterile gloves, sterile drape, hand hygiene and skin antiseptic. A timeout was performed prior to the initiation of the procedure. The right groin was prepped and draped in the usual sterile fashion. Thereafter using modified Seldinger technique, transfemoral access into the right common femoral artery was obtained without difficulty. Over a 0.035 inch guidewire, a 5 French Pinnacle sheath was inserted. Through this, and also over 0.035 inch guidewire, a 5 [place] JB 1 catheter was advanced to the aortic arch region and selectively positioned in the right common carotid artery, the innominate artery , the left common carotid artery and the left vertebral artery. FINDINGS: The left common carotid artery tear g demonstrates a moderate tortuosity of the proximal left  common carotid artery The left external carotid artery proximally has a mild to moderate stenosis. Is branches are otherwise normally opacified The left internal carotid artery at the bulb would demonstrates the complete angiographic occlusion with no evidence of a string sign on the delayed arterial images. The innominate artery arteriogram demonstrates patency of the right subclavian artery and the right common carotid artery proximally There is no angiographic evidence of the right vertebral artery The right common carotid bifurcation demonstrates the right external carotid artery and its major branches to be widely patent The right internal carotid artery just distal to the bulb has a severe 90% plus stenosis. Distal to this there is a U-shaped configuration of the right internal carotid artery. More distally the vessel is normal caliber The petrous the cavernous segments and supraclinoid segments are widely patent There is mild atherosclerotic irregularity involving the proximal cavernous, and distal cavernous portions of the right internal carotid artery. Right posterior communicating artery is seen opacifying the right posterior cerebral artery and the distal basilar artery. The right middle cerebral artery has approximately 30 40% stenosis of the right M1 segment The trifurcation branches appear to be widely patent into the capillary and venous phases The right anterior cerebral artery A1 segment and distally demonstrate wide patency is into the capillary and venous phases There is simultaneous cross-filling via the anterior communicating artery of the left anterior cerebral artery A2 segment and distally. Also demonstrated is opacification of the left anterior cerebral A1 segment, with flow noted into the left middle cerebral artery distribution. Unopacified blood is seen in the left middle cerebral artery from the collateral circulation. The left subclavian arteriogram demonstrates mild stenosis at the  origin of the right subclavian artery Mild atherosclerotic disease is noted of the subclavian artery and in the region of the thyrocervical trunk The dominant left vertebral artery origin is widely  patent The vessel demonstrates mild tortuosity just distal to this. More distally the vessel is seen to opacify to the cranial skull base There is severe focal of focal stenosis of the left vertebrobasilar junction just distal to the origin of the left posterior-inferior cerebellar artery Additionally there is a approximately 70% stenosis of the proximal basilar artery More distally the basilar artery demonstrates mild caliber irregularity. There is a opacification of the left posterior cerebral artery the superior cerebellar arteries left greater than right, and the anterior-inferior cerebellar arteries Retrograde opacification of the right vertebrobasilar junction to the level of the right posterior-inferior cerebellar arteries is noted. Caliber irregularity of the right posterior-inferior cerebellar artery suggests intracranial arteriosclerosis. Left internal carotid artery occlusion or revascularization attempt The diagnostic JB 1 catheter left common carotid artery was then exchanged over a 0.035 inch 300 cm a 7 French Pinnacle sheath in the right groin. A diagnostic JB 1 catheter were then advanced over the rise exchange guidewire to the descending thoracic aorta. The 7 French Pinnacle sheath was then exchanged over a 0.035 inch 100 cm Amplatz stiff guidewire for a a 8 French 55 cm Brite tip neurovascular sheath using biplane roadmap technique and constant fluoroscopic guidance. Good good aspiration was obtained from the side port of the neurovascular sheath. This was then connected to continuous heparinized saline infusion. The embolus guidewire was then removed. Through the 8 French neurovascular sheath, over a 0.035 inch roadrunner guidewire, a 5 5 [place] JB 1 catheter was advanced to the recover region and  select position in the left common carotid artery. Using biplane technique and constant fluoroscopic guidance, over the of the 5 inch roadrunner guidewire, the 5 [place] JB 1 catheter was advanced to the left external carotid artery and exchanged over a 0.035 inch 300 cm rise exchange guidewire for an 8 French 85 cm flow gap balloon guide catheter which had been prepped with 50% contrast and 50% heparinized saline infusion The guidewire was removed. Good aspiration obtained from the hub of the 8 [place] Flo guide catheter. Gentle contrast injection demonstrated no evidence of spasms dissections or of intraluminal filling defects. At this time, in a coaxial manner and with constant heparinized saline infusion, a tree approached 1 microcatheter was advanced over a point 0.014 inch soft tip synchro micro guidewire to the distal end of the Flo guide catheter The Flood guide catheter was advanced to just inside the left internal carotid artery bulb. Thereafter, multiple attempts were made to advance the microcatheter over the micro guidewire first with Ace synchro soft and then a regular synchro guidewire. In 016 headliner double angled micro guidewire was also utilized in order to access the occluded left internal carotid artery. It was noted that there was a severe tortuosity at the junction of the proximal and the middle 1/3 of the left internal carotid artery Following multiple attempts it was decided to stop. A control was then performed through the [facility] guide catheter in the left common carotid artery which revealed brisk cross-filling of the left internal carotid artery distal cavernous and supraclinoid segments from the left external carotid artery branches via the ipsilateral ophthalmic artery Flow is noted into the left middle cerebral artery as well Also noted was a retrograde opacification from the left posterior communicating artery into the supraclinoid left ICA and then middle cerebral artery An earlier  diagnostic catheter arteriogram of the right common carotid artery he had revealed partial cross-filling via the anterior communicating artery from the right internal carotid artery  Given the the collateral circulation, it was decided to stop the procedure. Throughout the procedure, the patient's blood pressure and neurological status remained stable No evidence of hemodynamic instability was noted No gross mass-effect or move filling defects or extravasation was seen Prior to the attempted revascularization of the left internal carotid artery occlusion, the patient was given 180 mg of Brilinta, and 81 mg of aspirin orally the an orogastric tube. Also the patient was loaded with 3000 units IV heparin in order to facilitate revascularization. The the 8 [place] flu guide catheter was then retrieved and removed. The 8 [place] Brite tip neurovascular sheath was then exchanged over a J-tip guidewire for a in 8 French 35 cm Brite tip neurovascular sheath. This was then connected to continuous heparinized saline infusion An arteriogram performed through this 3 5 cm 8 French Brite tip neurovascular sheath in the abdominal aorta reveal excellent flow through the common iliacs, the external and internal iliacs, and also the visualized portions of the common femoral arteries below the level of the inguinal ligaments. At the end of procedure, a CT of the brain revealed no evidence of gross intracranial hemorrhage,, or mass effect or midline shift. The patient was left intubated on account of his wound at the time of his intubation The right groin appeared soft without evidence of a hematoma. Distal pulses in the dorsalis pedis, and posterior regions remained dopplerable bilaterally. The patient was then transferred to the neuro ICU for further post stroke management. IMPRESSION: . 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 Approximately 90% stenosis of the dominant left vertebrobasilar junction distal to the origin of the left posterior-inferior cerebellar artery, and of approximately 70% of the proximal basilar artery. 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 PLAN: Follow-up in the clinic 1 month post discharge Electronically Signed   By: Julieanne Cotton M.D.   On: 08/28/2018 16:08   Vas [place] Lower Extremity Arterial Duplex  Result Date: 09/04/2018 LOWER EXTREMITY ARTERIAL DUPLEX STUDY  Current ABI: Not obtained Limitations: Cerebral angiogram 08/27/18, now with bleeding, pain, and bruising              at groin stick site. Performing Technologist: Gertie Fey MHA, RDMS, RVT, RDCS  Examination Guidelines: A complete evaluation includes B-mode imaging, spectral Doppler, color Doppler, and power Doppler as needed of all accessible portions of each vessel. Bilateral testing is considered an integral part of a complete examination. Limited examinations for reoccurring indications may be performed as noted.  Right Duplex Findings: +----------+--------+-----+--------+----------+--------+           PSV cm/sRatioStenosisWaveform  Comments +----------+--------+-----+--------+----------+--------+ CFA Prox  102                  monophasic         +----------+--------+-----+--------+----------+--------+ CFA Distal90                   monophasic         +----------+--------+-----+--------+----------+--------+ PTA Distal32                   monophasic         +----------+--------+-----+--------+----------+--------+ DP        44                   monophasic         +----------+--------+-----+--------+----------+--------+  Summary: Right: A right common femoral  artery pseudoaneurysm was identified measuring 2.8 x 1.5 x 2.5 cm, with a neck measuring 1.3cm long x 0.6cm wide. Pseudoaneurysm measures 2.1cm from the surface of the skin. The  proximal right common femoral artery exhibits a monophasic waveform, suggestive of possible aortoiliac obstruction. Distal posterior tibial artery exhibits monophasic flow, and the right dorsalis pedis artery exhibits monophasic flow, suggestive of proximal obstruction.  See table(s) above for measurements and observations.    Preliminary     PHYSICAL EXAM  Temp:  [97.8 F (36.6 C)-98.9 F (37.2 C)] 98 F (36.7 C) (01/04 1522) Pulse Rate:  [70-100] 100 (01/04 1522) Resp:  [20-24] 22 (01/04 1522) BP: (111-156)/(57-80) 141/64 (01/04 1522) SpO2:  [90 %-100 %] 90 % (01/04 1522)  General - Well nourished, well developed elderly Caucasian male,   Ophthalmologic - fundi not visualized due to noncooperation.  Cardiovascular - irregularly irregular heart rate and rhythm.  Abdomen - right lower abdominal wound still oozing blood  Neuro - awake alert,  able to following simple commands. Able to repeat sentences and spontaneous speech but paucity of speech. PERRLA, EOMI, able to track objects on the both sides, inconsistent blinking to visual threat bilaterally.  Facial symmetric.  Tongue midline.  Left upper extremity 4/5.  Right lower extremity and right upper extremity flaccid.  Left lower extremity proximal 2+/5 and distal 3/5.  DTR 1+, Babinski negative.  Sensation symmetrical, coordination slow but intact on the left finger-to-nose and gait not tested.   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  MRI head -left MCA and ACA territory scattered small infarcts  CTA H&N - left ICA, right VA occlusion.  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  2D Echo - EF 60 to 65%  LDL- 81  HgbA1c- 7.1  VTE prophylaxis -SCDs  aspirin 81 mg daily and Pradaxa (dabigatran) twice a dayprior to admission, now on Eliquis 5 mg twice daily  Ongoing aggressive stroke risk factor management  Therapy recommendations: SNF  Disposition: Pending  Right femoral A. 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 early on board  No intervention needed at this time  Continue dressing drainage  Pseudoaneurysm need to be addressed in the future with thrombin injection  Aspiration pneumonitis  Fever Tmax 101.2->101.3-> afebrile->101.3-> afebrile  Leukocytosis WBC 22.2-15.5-11.4-13.9->16.6-> 19.2->14.7->18.6  CXR CHF  Off lasix  Sputum culture normal flora  Off Zosyn today  UA negative  Respiratory distress  Was intubated on ventilation  Self extubated on 09/01/18  Off sedation  So far on nasal cannula, tolerating  CCM signed off  Atrial fibrillation, chronic  On Pradaxa PTA, compliant with medication  On metoprolol, rate controlled  On Eliquis and amiodarone po  Rate controlled  Cardiology signed off  Hypotension History of hypertension  BP stable   Off neo  On metoprolol  Avoid hypotension due to 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 as well as basal  artery stenosis  BP goal 120-160  CHF  CXR showed CHF  Fluid overload - off lasix now  Anasarca much improved  Cardiology signed off  Hyperlipidemia  Lipid lowering medication ZOX:WRUEPTA:none  LDL81, goal < 70  Current lipid lowering medication:none (statin intolerant - abnormal labs)  Add zetia   Continue zetia on d/c  Diabetes  HgbA1c7.1, goal < 7.0  Uncontrolled  Hyperglycemia improved  SSI  CBG monitoring  On Levemir, continue  Intermittent agitation  Likely due to aphasia, right hemiparesis and discomfort position with obesity  Better with family in room  Started on low dose seroquel 12.5mg  bid  Close monitoring  Other Stroke Risk Factors  Advanced age  Former cigarette smoker - quit  Obesity,Body mass index is 47.79 kg/m., recommend weight loss, diet and exercise as appropriate   Hx stroke/TIA  PVD  Other Active Problems  Urethral stricture -on Foley  Diffuse anasarca - much improved, continue diuresis one more day  Hypokalemia potassium 2.8->3.3, supplement  Low Magnesium 1.8 - supplement  Hospital day # 10 Continue ongoing treatment.resume CPAP at night for his sleep apnea. May use Haldol when necessary for agitation and may consider adding low-dose Seroquel at night for agitation and sleep Await transfer to skilled nursing facility when bed available.discuss with daughter at the bedside and answered questions. Greater than 50% time during this 25 minute visit was spent on counseling and coordination of care about his stroke  And agitation and answering questions   Delia HeadyPramod Sethi, M.D. Stroke Neurology 09/06/2018 4:31 PM     To contact Stroke Continuity provider, please refer to [email or web address]. After hours, contact General Neurology

## 2018-09-06 NOTE — Progress Notes (Signed)
Received report pt very letrhagic and SOB at rest.  Noted expiratory wheezing. Confusion severe expressive aphasia only follow simple  Command cannot tell his name where he is at nor month  pt desartutate below 88% on and off 2 liters [place] applied pulse oximeter changed  saturation  Up 92-95   Will notify RT  for CPAP   Reposition in a High fowlers position  Will continue to monitor pt closely.

## 2018-09-06 NOTE — Progress Notes (Signed)
[facility]  Subjective  - confused agitated   Objective 111/69 92 98.4 F (36.9 C) (Oral) (!) 23 97%  Intake/Output Summary (Last 24 hours) at 09/06/2018 1459 Last data filed at 09/05/2018 2204 Gross per 24 hour  Intake -  Output 300 ml  Net -300 ml   Right groin drainage of old appearing dark blood  Assessment/Planning: Right femoral pseudoaneurysm Severely debilitating stroke Dr Arbie Cookey will see again next week to consider when to reimage or treat Not urgent to treat this currently  Fabienne Bruns 09/06/2018 2:59 PM --  Laboratory Lab Results: Recent Labs    09/05/18 0308 09/06/18 0605  WBC 18.6* 16.6*  HGB 10.7* 11.0*  HCT 33.5* 34.0*  PLT 500* 488*   BMET Recent Labs    09/05/18 0308 09/06/18 0605  NA 144 141  K 3.3* 4.2  CL 99 102  CO2 34* 30  GLUCOSE 168* 158*  BUN 12 12  CREATININE 1.18 1.15  CALCIUM 8.5* 8.8*    COAG Lab Results  Component Value Date   INR 1.50 08/27/2018   INR 1.1 03/07/2013   No results found for: PTT

## 2018-09-07 LAB — BASIC METABOLIC PANEL
Anion gap: 12 (ref 5–15)
BUN: 19 mg/dL (ref 8–23)
CO2: 23 mmol/L (ref 22–32)
Calcium: 9.4 mg/dL (ref 8.9–10.3)
Chloride: 109 mmol/L (ref 98–111)
Creatinine, Ser: 1.23 mg/dL (ref 0.61–1.24)
GFR calc Af Amer: >60 mL/min (ref >60)
GFR calc non Af Amer: 56 mL/min — ABNORMAL LOW (ref >60)
Glucose, Bld: 163 mg/dL — ABNORMAL HIGH (ref 70–99)
Potassium: 5.3 mmol/L — ABNORMAL HIGH (ref 3.5–5.1)
Sodium: 144 mmol/L (ref 135–145)

## 2018-09-07 LAB — GLUCOSE, CAPILLARY
Glucose-Capillary: 171 mg/dL — ABNORMAL HIGH (ref 70–99)
Glucose-Capillary: 125 mg/dL — ABNORMAL HIGH (ref 70–99)
Glucose-Capillary: 187 mg/dL — ABNORMAL HIGH (ref 70–99)
Glucose-Capillary: 139 mg/dL — ABNORMAL HIGH (ref 70–99)
Glucose-Capillary: 178 mg/dL — ABNORMAL HIGH (ref 70–99)

## 2018-09-07 MED ORDER — FENTANYL CITRATE (PF) 100 MCG/2ML IJ SOLN
12.5000 ug | Freq: Once | INTRAMUSCULAR | Status: AC
  Administered 2018-09-07: 12.5 ug via INTRAVENOUS
  Filled 2018-09-07: qty 2

## 2018-09-07 MED ORDER — QUETIAPINE FUMARATE 25 MG PO TABS
12.5000 mg | ORAL_TABLET | Freq: Every morning | ORAL | Status: DC
  Administered 2018-09-08: 12.5 mg via ORAL
  Filled 2018-09-07: qty 1

## 2018-09-07 NOTE — Progress Notes (Signed)
CSW attempted once more to reach pt's wife to complete assessment.-no answer at this time-unable to leave voicemail.  CSW will continue to follow for further disposition needs regarding placement.       Claude Manges Taneesha Edgin, MSW, LCSW-A Emergency Department Clinical Social Worker [phone]

## 2018-09-07 NOTE — Progress Notes (Signed)
STROKE TEAM PROGRESS NOTE   SUBJECTIVE (INTERVAL HISTORY)  he remains intermittently agitated and confused. Requires sitter at the bedside.he also has wrist restraints. His wife is at the bedside today  OBJECTIVE Temp:  [98 F (36.7 C)-99.1 F (37.3 C)] 99 F (37.2 C) (01/05 1236) Pulse Rate:  [63-130] 98 (01/05 1236) Cardiac Rhythm: Atrial fibrillation (01/05 0825) Resp:  [20-24] 22 (01/05 1236) BP: (105-141)/(48-65) 108/60 (01/05 1236) SpO2:  [90 %-98 %] 98 % (01/05 1236)  Recent Labs  Lab 09/06/18 1757 09/06/18 2156 09/07/18 0419 09/07/18 0608 09/07/18 1118  GLUCAP 148* 137* 171* 125* 187*   Recent Labs  Lab 09/03/18 0206  09/04/18 0530 09/04/18 2157 09/05/18 0308 09/06/18 0605 09/07/18 0907  NA 146*   < > 145 144 144 141 144  K 2.8*   < > 2.8* 3.3* 3.3* 4.2 5.3*  CL 105   < > 103 101 99 102 109  CO2 26   < > 31 31 34* 30 23  GLUCOSE 87   < > 144* 156* 168* 158* 163*  BUN 19   < > 14 11 12 12 19   CREATININE 1.23   < > 1.07 1.10 1.18 1.15 1.23  CALCIUM 8.6*   < > 8.0* 8.4* 8.5* 8.8* 9.4  MG 1.8  --   --  1.9 1.8  --   --    < > = values in this interval not displayed.   No results for input(s): AST, ALT, ALKPHOS, BILITOT, PROT, ALBUMIN in the last 168 hours. Recent Labs  Lab 09/02/18 0541 09/03/18 0206 09/04/18 0530 09/05/18 0308 09/06/18 0605  WBC 16.6* 19.2* 14.7* 18.6* 16.6*  HGB 9.4* 9.9* 9.3* 10.7* 11.0*  HCT 29.3* 31.3* 30.2* 33.5* 34.0*  MCV 101.7* 102.3* 101.0* 100.6* 102.1*  PLT 322 363 377 500* 488*   No results for input(s): CKTOTAL, CKMB, CKMBINDEX, TROPONINI in the last 168 hours. No results for input(s): LABPROT, INR in the last 72 hours. No results for input(s): COLORURINE, LABSPEC, PHURINE, GLUCOSEU, HGBUR, BILIRUBINUR, KETONESUR, PROTEINUR, UROBILINOGEN, NITRITE, LEUKOCYTESUR in the last 72 hours.  Invalid input(s): APPERANCEUR     Component Value Date/Time   CHOL 138 08/28/2018 0600   TRIG 171 (H) 08/28/2018 0921   HDL 24 (L)  08/28/2018 0600   CHOLHDL 5.8 08/28/2018 0600   VLDL 33 08/28/2018 0600   LDLCALC 81 08/28/2018 0600   Lab Results  Component Value Date   HGBA1C 7.1 (H) 08/28/2018   No results found for: LABOPIA, COCAINSCRNUR, LABBENZ, AMPHETMU, THCU, LABBARB  No results for input(s): ETH in the last 168 hours.  I have personally reviewed the radiological images below and agree with the radiology interpretations.  Ct Angio Head W Or Wo Contrast  Result Date: 08/27/2018 CLINICAL DATA:  Acute presentation with speech disturbance. EXAM: CT ANGIOGRAPHY HEAD AND NECK TECHNIQUE: Multidetector CT imaging of the head and neck was performed using the standard protocol during bolus administration of intravenous contrast. Multiplanar CT image reconstructions and MIPs were obtained to evaluate the vascular anatomy. Carotid stenosis measurements (when applicable) are obtained utilizing NASCET criteria, using the distal internal carotid diameter as the denominator. CONTRAST:  75mL ISOVUE-370 IOPAMIDOL (ISOVUE-370) INJECTION 76% COMPARISON:  CT same day. FINDINGS: CTA NECK FINDINGS Aortic arch: Aortic atherosclerosis.  No aneurysm or dissection. Right carotid system: Common carotid artery shows some atherosclerotic plaque but is widely patent to the bifurcation. There is soft and calcified plaque affecting the carotid bifurcation and ICA bulb region. There is severe  stenosis in the distal bulb region with luminal diameter of 1 mm. The vessel is quite tortuous beyond that but widely patent to the upper cervical region, where there is soft and calcified plaque resulting in minimal diameter of 3.5 mm. Compared to an expected diameter of 5 mm, stenosis in the bulb is 80% or greater. Stenosis in the upper cervical region is 30%. Left carotid system: Common carotid artery shows atherosclerotic plaque but is sufficiently patent to the bifurcation region. There is calcified and soft plaque at the carotid bifurcation and ICA bulb. There  is left internal carotid artery occlusion at the distal bulb. 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 The left vertebral artery shows 50% stenosis at its origin but is sufficiently patent beyond that through the cervical region to the foramen magnum. Skeleton: Mid cervical spondylosis. Other neck: No mass or lymphadenopathy. Upper chest: Interstitial prominence which could go along with fluid overload or mild edema. Review of the MIP images confirms the above findings CTA HEAD FINDINGS Anterior circulation: Left internal carotid artery is occluded without antegrade flow through the skull base. Right internal carotid artery shows atherosclerotic disease in the carotid siphon region with stenosis estimated at 50-70%. The anterior and middle cerebral vessels are patent. There is atherosclerotic irregularity of the M1 segment with stenosis of 50-70%. No acute vessel occlusion is identified. There is a chronic punctate calcified embolus in 1 of the insular branches, but this was present in 2014. On the left, there is reconstituted flow in the distal siphon. Severe stenosis of the supraclinoid ICA, 80% or greater. Flow is present in the anterior and middle cerebral vessels, secondary to reconstituted flow and flow through communicating arteries. There is 50% stenosis in the M1 segment. I do not see any occluded or missing large or medium vessels in the MCA territory. Posterior circulation: Right vertebral artery shows no antegrade flow at the foramen magnum. There is retrograde flow in the distal right vertebral. Left vertebral artery is patent at the foramen magnum. There is atherosclerotic disease of the V4 segment with stenosis estimated at 70%. There are serial stenoses. The vessel does show flow to the basilar, which shows atherosclerotic irregularity but is patent. Flow is present in the superior cerebellar and posterior cerebral arteries. Venous sinuses: Patent and normal. Anatomic  variants: None significant. Delayed phase: No abnormal enhancement. 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 Severe irregular atherosclerotic disease of the right carotid bifurcation with stenosis of 80% in the bulb region and 30% in the distal ICA. 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 50% stenosis of the left vertebral artery origin. Severe atherosclerotic disease in both carotid siphon regions. Right siphon stenosis estimated at 50-70%. Reconstituted flow in the distal left siphon and supraclinoid region. 80% supraclinoid stenosis. Flow in both anterior and middle cerebral artery territories. Stenosis of both M1 segments estimated at 50-70%. I do not identify any large or medium vessel occlusion acutely within the MCA branch vessels. Severely disease left V4 segment with serial stenoses proximal to the basilar estimated at 70%. Basilar atherosclerotic irregularity without flow limiting stenosis. Posterior circulation branch vessels do show flow, including right PICA which is supplied by retrograde flow in the distal right vertebral artery. Electronically Signed   By: Paulina Fusi M.D.   On: 08/27/2018 13:14   Dg Chest 2 View  Result Date: 08/27/2018 CLINICAL DATA:  Chest pain and shortness  of breath.  Cardiomyopathy. EXAM: CHEST - 2 VIEW COMPARISON:  03/07/2013 FINDINGS: Mild cardiomegaly. Aortic atherosclerosis. Diffuse interstitial infiltrates, consistent with pulmonary edema. Mild subsegmental atelectasis also seen in the left upper lobe. No evidence of pulmonary consolidation or significant pleural effusion. IMPRESSION: Mild cardiomegaly and diffuse interstitial edema, consistent with congestive heart failure. Electronically Signed   By: Myles Rosenthal M.D.   On: 08/27/2018 12:02   Ct Angio Neck W Or Wo Contrast  Result Date: 08/27/2018 CLINICAL DATA:  Acute presentation with speech disturbance. EXAM: CT  ANGIOGRAPHY HEAD AND NECK TECHNIQUE: Multidetector CT imaging of the head and neck was performed using the standard protocol during bolus administration of intravenous contrast. Multiplanar CT image reconstructions and MIPs were obtained to evaluate the vascular anatomy. Carotid stenosis measurements (when applicable) are obtained utilizing NASCET criteria, using the distal internal carotid diameter as the denominator. CONTRAST:  75mL ISOVUE-370 IOPAMIDOL (ISOVUE-370) INJECTION 76% COMPARISON:  CT same day. FINDINGS: CTA NECK FINDINGS Aortic arch: Aortic atherosclerosis.  No aneurysm or dissection. Right carotid system: Common carotid artery shows some atherosclerotic plaque but is widely patent to the bifurcation. There is soft and calcified plaque affecting the carotid bifurcation and ICA bulb region. There is severe stenosis in the distal bulb region with luminal diameter of 1 mm. The vessel is quite tortuous beyond that but widely patent to the upper cervical region, where there is soft and calcified plaque resulting in minimal diameter of 3.5 mm. Compared to an expected diameter of 5 mm, stenosis in the bulb is 80% or greater. Stenosis in the upper cervical region is 30%. Left carotid system: Common carotid artery shows atherosclerotic plaque but is sufficiently patent to the bifurcation region. There is calcified and soft plaque at the carotid bifurcation and ICA bulb. There is left internal carotid artery occlusion at the distal bulb. 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 The left vertebral artery shows 50% stenosis at its origin but is sufficiently patent beyond that through the cervical region to the foramen magnum. Skeleton: Mid cervical spondylosis. Other neck: No mass or lymphadenopathy. Upper chest: Interstitial prominence which could go along with fluid overload or mild edema. Review of the MIP images confirms the above findings CTA HEAD FINDINGS Anterior  circulation: Left internal carotid artery is occluded without antegrade flow through the skull base. Right internal carotid artery shows atherosclerotic disease in the carotid siphon region with stenosis estimated at 50-70%. The anterior and middle cerebral vessels are patent. There is atherosclerotic irregularity of the M1 segment with stenosis of 50-70%. No acute vessel occlusion is identified. There is a chronic punctate calcified embolus in 1 of the insular branches, but this was present in 2014. On the left, there is reconstituted flow in the distal siphon. Severe stenosis of the supraclinoid ICA, 80% or greater. Flow is present in the anterior and middle cerebral vessels, secondary to reconstituted flow and flow through communicating arteries. There is 50% stenosis in the M1 segment. I do not see any occluded or missing large or medium vessels in the MCA territory. Posterior circulation: Right vertebral artery shows no antegrade flow at the foramen magnum. There is retrograde flow in the distal right vertebral. Left vertebral artery is patent at the foramen magnum. There is atherosclerotic disease of the V4 segment with stenosis estimated at 70%. There are serial stenoses. The vessel does show flow to the basilar, which shows atherosclerotic irregularity but is patent. Flow is present in the superior cerebellar and posterior  cerebral arteries. Venous sinuses: Patent and normal. Anatomic variants: None significant. Delayed phase: No abnormal enhancement. 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 Severe irregular atherosclerotic disease of the right carotid bifurcation with stenosis of 80% in the bulb region and 30% in the distal ICA. 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 50% stenosis of the left vertebral artery origin. Severe atherosclerotic disease in both carotid siphon regions. Right siphon stenosis estimated at  50-70%. Reconstituted flow in the distal left siphon and supraclinoid region. 80% supraclinoid stenosis. Flow in both anterior and middle cerebral artery territories. Stenosis of both M1 segments estimated at 50-70%. I do not identify any large or medium vessel occlusion acutely within the MCA branch vessels. Severely disease left V4 segment with serial stenoses proximal to the basilar estimated at 70%. Basilar atherosclerotic irregularity without flow limiting stenosis. Posterior circulation branch vessels do show flow, including right PICA which is supplied by retrograde flow in the distal right vertebral artery. Electronically Signed   By: Paulina Fusi M.D.   On: 08/27/2018 13:14   Mr Brain Wo Contrast  Result Date: 08/28/2018 CLINICAL DATA:  Follow-up examination for acute stroke, right lower extremity weakness. Known left ICA occlusion. EXAM: MRI HEAD WITHOUT CONTRAST TECHNIQUE: Multiplanar, multiecho pulse sequences of the brain and surrounding structures were obtained without intravenous contrast. COMPARISON:  Prior CTs from 08/27/2018. FINDINGS: Brain: Generalized age-related cerebral atrophy. Patchy and confluent T2/FLAIR hyperintensity within the periventricular and deep white matter both cerebral hemispheres most consistent with chronic microvascular ischemic disease, moderate nature. Scatter remote cortical infarcts involving the posterior left frontoparietal region, right parietal lobe, and right frontal lobe are seen. Small remote right cerebellar infarct. Remote lacunar infarct present at the left caudate head. Scattered chronic hemosiderin staining seen about several of these infarcts. Scattered multifocal foci of restricted diffusion involving the cortical subcortical left frontal, parietal, and temporal occipital regions, compatible with acute ischemic left MCA territory infarcts. Largest area of infarction seen at the high left frontal parietal region and measures 2.5 cm (series 3, image 48).  Possible faint petechial hemorrhage about a few of these infarcts without frank hemorrhagic transformation or significant mass effect. Findings likely embolic in nature. No mass lesion, midline shift or mass effect. Mild diffuse ventricular prominence related global parenchymal volume loss without hydrocephalus. No extra-axial fluid collection. Pituitary gland within normal limits. Vascular: Abnormal flow void within the left ICA to its cavernous segment, compatible with previously identified left ICA occlusion. Major intracranial vascular flow voids otherwise maintained at the skull base. Skull and upper cervical spine: Craniocervical junction within normal limits. Upper cervical spine normal. Bone marrow signal intensity within normal limits. No scalp soft tissue abnormality. Sinuses/Orbits: Patient status post bilateral ocular lens replacement. Globes and orbital soft tissues demonstrate no acute finding. Scattered mucosal thickening seen throughout the paranasal sinuses with superimposed scattered air-fluid levels. Fluid seen within the nasopharynx. Patient likely intubated. Trace bilateral mastoid effusions noted. Inner ear structures normal. Other: None. IMPRESSION: 1. 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 Possible faint scattered associated petechial hemorrhage without frank hemorrhagic transformation or significant mass effect. 2. Abnormal flow void within the left ICA to its cavernous segment, consistent with known left ICA occlusion. 3. 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 4. Moderately advanced cerebral atrophy with chronic small vessel ischemic disease. Electronically Signed   By:  Rise Mu M.D.   On: 08/28/2018 15:33   Ct Abdomen Pelvis W Contrast  Result Date: 08/29/2018 CLINICAL DATA:  Abdominal distention.  Bloating. EXAM: CT ABDOMEN AND  PELVIS WITH CONTRAST TECHNIQUE: Multidetector CT imaging of the abdomen and pelvis was performed using the standard protocol following bolus administration of intravenous contrast. CONTRAST:  OMNIPAQUE IOHEXOL 300 MG/ML  SOLN COMPARISON:  Abdominal radiograph dated 08/28/2018 and abdominal ultrasound dated 02/28/2016 FINDINGS: Lower chest: There are bilateral consolidative infiltrates in the lower lobes as well as minimal bilateral pleural effusions. Aortic atherosclerosis. Coronary artery calcifications. Hepatobiliary: Liver parenchyma is normal. No biliary ductal dilatation. Vicarious excretion of contrast in the normal appearing gallbladder. Pancreas: Unremarkable. No pancreatic ductal dilatation or surrounding inflammatory changes. Spleen: Normal in size without focal abnormality. Adrenals/Urinary Tract: Adrenal glands are normal. Normal right kidney. Large chronic cyst in the lower pole of the left kidney with some calcification in the wall. Maximum diameter of the cyst is approximately 9 cm. Stomach/Bowel: NG tube tip in the body of the stomach. There are few diverticula in the proximal sigmoid portion of the colon. There is moderate air in the colon without significant distention. Small bowel appears normal. Fluid and air in the stomach. Appendix has been removed. Vascular/Lymphatic: There is a poorly defined hematoma in the right inguinal region in the panniculus. This measures approximately 12 x 5 x 2 cm and is probably related to recent angiography. There are several small lymph nodes in the right inguinal region, the largest being 15 mm in diameter. Extensive aortic atherosclerosis. No adenopathy. Reproductive: Dense calcification in the normal-sized prostate gland. Other: There is a small amount of ascites in the abdomen, nonspecific. Small periumbilical hernia containing only fat. Edema in the subcutaneous fat of both flanks, right greater than left. Slight edema in the panniculus and in the  lateral aspect of both thighs. Musculoskeletal: No acute abnormalities. Multilevel degenerative disc disease in the thoracic spine and lumbar spine. IMPRESSION: 1. Small hematoma in the right inguinal region in the panniculus, probably secondary to the recent angiography. 2. Bilateral consolidative infiltrates in the posterior aspect of both lower lobes with tiny adjacent pleural effusions. 3. Small amount of nonspecific ascites. 4. Slightly prominent gas in the bowel without evidence of bowel obstruction. This probably represents an ileus. Electronically Signed   By: Francene Boyers M.D.   On: 08/29/2018 17:03   Ir Ct Head Ltd  Result Date: 08/31/2018 CLINICAL DATA:  New onset right-sided weakness with mild word-finding difficulties. 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 EXAM: IR ANGIO INTRA EXTRACRAN SEL COM CAROTID INNOMINATE UNI RIGHT MOD SED; IR PERCUTANEOUS ART THORMBECTOMY/INFUSION INTRACRANIAL INCLUDE DIAG ANGIO; IR ANGIO VERTEBRAL SEL SUBCLAVIAN INNOMINATE UNI LEFT MOD SED; IR CT HEAD LIMITED COMPARISON:  CT angiogram of the head and neck of 08/27/2018 MEDICATIONS: Heparin 3,000 units IV; . Ancef 2 g IV antibiotic was administered within 1 hour of the procedure ANESTHESIA/SEDATION: General anesthesia CONTRAST:  Isovue 300 approximately 100 mL FLUOROSCOPY TIME:  Fluoroscopy Time: 60 minutes 0 seconds (4327 mGy). COMPLICATIONS: None immediate. TECHNIQUE: Informed written consent was obtained from the patient after a thorough discussion of the procedural risks, benefits and alternatives. All questions were addressed. Maximal Sterile Barrier Technique was utilized including caps, mask, sterile gowns, sterile gloves, sterile drape, hand hygiene and skin antiseptic. A timeout  was performed prior to the initiation of the procedure. The right  groin was prepped and draped in the usual sterile fashion. Thereafter using modified Seldinger technique, transfemoral access into the right common femoral artery was obtained without difficulty. Over a 0.035 inch guidewire, a 5 French Pinnacle sheath was inserted. Through this, and also over 0.035 inch guidewire, a 5 [place] JB 1 catheter was advanced to the aortic arch region and selectively positioned in the right common carotid artery, the innominate artery , the left common carotid artery and the left vertebral artery. FINDINGS: The left common carotid artery tear g demonstrates a moderate tortuosity of the proximal left common carotid artery The left external carotid artery proximally has a mild to moderate stenosis. Is branches are otherwise normally opacified The left internal carotid artery at the bulb would demonstrates the complete angiographic occlusion with no evidence of a string sign on the delayed arterial images. The innominate artery arteriogram demonstrates patency of the right subclavian artery and the right common carotid artery proximally There is no angiographic evidence of the right vertebral artery The right common carotid bifurcation demonstrates the right external carotid artery and its major branches to be widely patent The right internal carotid artery just distal to the bulb has a severe 90% plus stenosis. Distal to this there is a U-shaped configuration of the right internal carotid artery. More distally the vessel is normal caliber The petrous the cavernous segments and supraclinoid segments are widely patent There is mild atherosclerotic irregularity involving the proximal cavernous, and distal cavernous portions of the right internal carotid artery. Right posterior communicating artery is seen opacifying the right posterior cerebral artery and the distal basilar artery. The right middle cerebral artery has approximately 30 40% stenosis of the right M1 segment The trifurcation  branches appear to be widely patent into the capillary and venous phases The right anterior cerebral artery A1 segment and distally demonstrate wide patency is into the capillary and venous phases There is simultaneous cross-filling via the anterior communicating artery of the left anterior cerebral artery A2 segment and distally. Also demonstrated is opacification of the left anterior cerebral A1 segment, with flow noted into the left middle cerebral artery distribution. Unopacified blood is seen in the left middle cerebral artery from the collateral circulation. 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 More distally the vessel is seen to opacify to the cranial skull base There is severe focal of focal stenosis of the left vertebrobasilar junction just distal to the origin of the left posterior-inferior cerebellar artery Additionally there is a approximately 70% stenosis of the proximal basilar artery More distally the basilar artery demonstrates mild caliber irregularity. There is a opacification of the left posterior cerebral artery the superior cerebellar arteries left greater than right, and the anterior-inferior cerebellar arteries Retrograde opacification of the right vertebrobasilar junction to the level of the right posterior-inferior cerebellar arteries is noted. Caliber irregularity of the right posterior-inferior cerebellar artery suggests intracranial arteriosclerosis. Left internal carotid artery occlusion or revascularization attempt The diagnostic JB 1 catheter left common carotid artery was then exchanged over a 0.035 inch 300 cm a 7 French Pinnacle sheath in the right groin. A diagnostic JB 1 catheter were then  advanced over the rise exchange guidewire  to the descending thoracic aorta. The 7 French Pinnacle sheath was then exchanged over a 0.035 inch 100 cm Amplatz stiff guidewire for a a 8 French 55 cm Brite tip neurovascular sheath using biplane roadmap technique and constant fluoroscopic guidance. Good good aspiration was obtained from the side port of the neurovascular sheath. This was then connected to continuous heparinized saline infusion. The embolus guidewire was then removed. Through the 8 French neurovascular sheath, over a 0.035 inch roadrunner guidewire, a 5 5 [place] JB 1 catheter was advanced to the recover region and select position in the left common carotid artery. Using biplane technique and constant fluoroscopic guidance, over the of the 5 inch roadrunner guidewire, the 5 [place] JB 1 catheter was advanced to the left external carotid artery and exchanged over a 0.035 inch 300 cm rise exchange guidewire for an 8 French 85 cm flow gap balloon guide catheter which had been prepped with 50% contrast and 50% heparinized saline infusion The guidewire was removed. Good aspiration obtained from the hub of the 8 [place] Flo guide catheter. Gentle contrast injection demonstrated no evidence of spasms dissections or of intraluminal filling defects. At this time, in a coaxial manner and with constant heparinized saline infusion, a tree approached 1 microcatheter was advanced over a point 0.014 inch soft tip synchro micro guidewire to the distal end of the Flo guide catheter The Flood guide catheter was advanced to just inside the left internal carotid artery bulb. Thereafter, multiple attempts were made to advance the microcatheter over the micro guidewire first with Ace synchro soft and then a regular synchro guidewire. In 016 headliner double angled micro guidewire was also utilized in order to access the occluded left internal carotid artery. It was noted that there was a severe tortuosity at the junction of the  proximal and the middle 1/3 of the left internal carotid artery Following multiple attempts it was decided to stop. A control was then performed through the [facility] guide catheter in the left common carotid artery which revealed brisk cross-filling of the left internal carotid artery distal cavernous and supraclinoid segments from the left external carotid artery branches via the ipsilateral ophthalmic artery Flow is noted into the left middle cerebral artery as well Also noted was a retrograde opacification from the left posterior communicating artery into the supraclinoid left ICA and then middle cerebral artery An earlier diagnostic catheter arteriogram of the right common carotid artery he had revealed partial cross-filling via the anterior communicating artery from the right internal carotid artery Given the the collateral circulation, it was decided to stop the procedure. Throughout the procedure, the patient's blood pressure and neurological status remained stable No evidence of hemodynamic instability was noted No gross mass-effect or move filling defects or extravasation was seen Prior to the attempted revascularization of the left internal carotid artery occlusion, the patient was given 180 mg of Brilinta, and 81 mg of aspirin orally the an orogastric tube. Also the patient was loaded with 3000 units IV heparin in order to facilitate revascularization. The the 8 [place] flu guide catheter was then retrieved and removed. The 8 [place] Brite tip neurovascular sheath was then exchanged over a J-tip guidewire for a in 8 French 35 cm Brite tip neurovascular sheath. This was then connected to continuous heparinized saline infusion An arteriogram performed through this 3 5 cm 8 French Brite tip neurovascular sheath in the abdominal aorta reveal excellent flow through the common iliacs, the external and internal iliacs, and also the visualized portions  of the common femoral arteries below the level of the inguinal  ligaments. At the end of procedure, a CT of the brain revealed no evidence of gross intracranial hemorrhage,, or mass effect or midline shift. The patient was left intubated on account of his wound at the time of his intubation The right groin appeared soft without evidence of a hematoma. Distal pulses in the dorsalis pedis, and posterior regions remained dopplerable bilaterally. The patient was then transferred to the neuro ICU for further post stroke management. IMPRESSION: . 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 Approximately 90% stenosis of the dominant left vertebrobasilar junction distal to the origin of the left posterior-inferior cerebellar artery, and of approximately 70% of the proximal basilar artery. 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 PLAN: Follow-up in the clinic 1 month post discharge Electronically Signed   By: Julieanne Cotton M.D.   On: 08/28/2018 16:08   Ct Cerebral Perfusion W Contrast  Result Date: 08/27/2018 CLINICAL DATA:  Right-sided weakness onset this morning. EXAM: CT PERFUSION BRAIN TECHNIQUE: Multiphase CT imaging of the brain was performed following IV bolus contrast injection. Subsequent parametric perfusion maps were calculated using RAPID software. CONTRAST:  88mL ISOVUE-370 IOPAMIDOL (ISOVUE-370) INJECTION 76% COMPARISON:  CT a head today FINDINGS: CT Brain Perfusion Findings: CBF (<30%) Volume: 71mL Perfusion (Tmax>6.0s) volume: Mismatch Volume: ASPECTS on noncontrast CT Head today, not calculated due to extensive chronic ischemic change. No definite acute infarct on CT. Infarct Core: 0 mL Infarction Location:Delayed perfusion throughout the left MCA territory with apparent sparing of the basal ganglia. There is occlusion of the left internal carotid artery on CTA, age indeterminate.  Left middle cerebral artery is diseased and may have delayed perfusion due to collateral circulation and stenosis. IMPRESSION: 155 mL of delayed perfusion in the left MCA territory. No core infarction. Delayed perfusion left MCA territory may be due to severe intracranial atherosclerotic disease as well as occlusion of the left internal carotid artery. Age of the internal carotid artery occlusion is indeterminate. It is possible this delayed perfusion is a chronic or possibly acute finding. Electronically Signed   By: Marlan Palau M.D.   On: 08/27/2018 16:12   Dg Chest Port 1 View  Result Date: 09/03/2018 CLINICAL DATA:  Shortness of breath. EXAM: PORTABLE CHEST 1 VIEW COMPARISON:  Radiograph September 02, 2018. FINDINGS: Stable cardiomegaly. Atherosclerosis of thoracic aorta is noted. No pneumothorax is noted. Right internal jugular catheter is unchanged in position. Stable bilateral pulmonary edema is noted with minimal pleural effusions. Bony thorax is unremarkable. IMPRESSION: Stable cardiomegaly with bilateral pulmonary edema and minimal pleural effusions. Aortic Atherosclerosis (ICD10-I70.0). Electronically Signed   By: Lupita Raider, M.D.   On: 09/03/2018 12:33   Dg Chest Port 1 View  Result Date: 09/02/2018 CLINICAL DATA:  Acute respiratory distress. EXAM: PORTABLE CHEST 1 VIEW COMPARISON:  One-view chest x-ray 09/01/2018 FINDINGS: The heart is enlarged. Patient has been extubated. NG tube was removed. A right IJ line remains. Atherosclerotic changes are present at the aortic arch. Diffuse interstitial edema is stable. Bibasilar airspace disease is worse on the left. IMPRESSION: 1. Interval extubation and removal of NG tube. 2. Stable cardiomegaly and edema consistent with congestive heart failure. 3. Aortic atherosclerosis. Electronically Signed   By: Marin Roberts M.D.   On: 09/02/2018 07:29   Dg Chest Port 1 View  Result Date: 09/01/2018 CLINICAL  DATA:  Respiratory failure. EXAM:  PORTABLE CHEST 1 VIEW COMPARISON:  08/31/2018 FINDINGS: Endotracheal tube terminates 4.2 cm above the carina. Right jugular catheter terminates over the mid SVC. Enteric tube courses into the left upper abdomen. The cardiac silhouette remains enlarged. Patchy airspace opacities throughout both lungs have not significantly changed. IMPRESSION: Bilateral airspace disease without significant interval change. Electronically Signed   By: Sebastian Ache M.D.   On: 09/01/2018 06:54   Dg Chest Port 1 View  Result Date: 08/31/2018 CLINICAL DATA:  CXR for Respiratory failure. Hx of HTN, stroke, AAA, Diabetes, and Cardiomyopathy. EXAM: PORTABLE CHEST 1 VIEW COMPARISON:  08/30/2018 FINDINGS: Endotracheal tube is in place, tip 3.8 centimeters above the carina. Nasogastric tube is in place, tip beyond the gastroesophageal junction and off the image. A RIGHT IJ central line tip overlies the superior vena cava. Patient is rotated towards the LEFT. The heart is enlarged. There is persistent airspace filling opacity throughout the RIGHT lung, slightly improved. There is significant opacity in the LEFT lung base, consistent with atelectasis or consolidation and increased since 08/27/2018. IMPRESSION: 1. Slight improvement in aeration of the RIGHT lung. 2. Increased opacity in the LEFT lung base. Electronically Signed   By: Norva Pavlov M.D.   On: 08/31/2018 09:52   Dg Chest Port 1 View  Result Date: 08/30/2018 CLINICAL DATA:  Respiratory failure. Cardiomyopathy. EXAM: PORTABLE CHEST 1 VIEW COMPARISON:  08/29/2018 and 08/27/2018 FINDINGS: Endotracheal tube and NG tube and central line appear unchanged and in good position. There has been progression of the bilateral diffuse pulmonary infiltrates, particularly in the right upper lung zone of the left lung base. Heart size and pulmonary vascularity are normal. No discrete effusions. Aortic atherosclerosis. No acute bone abnormality. IMPRESSION: Progressive bilateral pulmonary  infiltrates. Aortic Atherosclerosis (ICD10-I70.0). Electronically Signed   By: Francene Boyers M.D.   On: 08/30/2018 08:48   Dg Chest Port 1 View  Result Date: 08/29/2018 CLINICAL DATA:  Central line placement EXAM: PORTABLE CHEST 1 VIEW COMPARISON:  08/29/2018 FINDINGS: Right central line is been placed with the tip in the SVC. No pneumothorax. Endotracheal tube and NG tube are unchanged. Cardiomegaly with vascular congestion and mild pulmonary edema, stable. IMPRESSION: Right central line tip in the SVC. No pneumothorax. Continued mild CHF. Electronically Signed   By: Charlett Nose M.D.   On: 08/29/2018 11:58   Dg Chest Port 1 View  Result Date: 08/29/2018 CLINICAL DATA:  PT presented to [facility] ED on 08/27/18 with complaints of right lower extremity weakness, dyspnea and chest pressure. Hx of stroke, Peripheral vascualar disease, cardiomyopathy, and A-fib. EXAM: PORTABLE CHEST 1 VIEW COMPARISON:  08/27/2018 FINDINGS: Mild enlargement of the cardiac silhouette, stable. No mediastinal or hilar masses. There is central vascular congestion. Mild hazy opacity noted in the perihilar regions and medial lung bases bilaterally. No convincing pleural effusion and no pneumothorax. Endotracheal tube tip projects 3.8 cm above the Carina. Nasogastric tube passes below the diaphragm well into the stomach. IMPRESSION: 1. Findings are consistent with mild congestive heart failure with evidence of slight improvement when compared to 08/27/2018. No new abnormalities. 2. Support apparatus is well positioned. Electronically Signed   By: Amie Portland M.D.   On: 08/29/2018 07:26   Dg Chest Port 1 View  Result Date: 08/28/2018 CLINICAL DATA:  Shortness of Breath EXAM: PORTABLE CHEST 1 VIEW COMPARISON:  08/27/2018 FINDINGS: Cardiac shadow is enlarged in size. Aortic calcifications are noted. Endotracheal tube is noted at the level of the carina. This should  be withdrawn 2-3 cm. Nasogastric catheter is noted within the stomach.  Lungs are well aerated bilaterally with mild interstitial edema similar to that seen on the prior exam. IMPRESSION: Interval intubation with the endotracheal tube at the level of the carina. This should be withdrawn 2-3 cm. Stable edema bilaterally. These results will be called to the ordering clinician or representative by the Radiologist Assistant, and communication documented in the PACS or zVision Dashboard. Electronically Signed   By: Alcide Clever M.D.   On: 08/28/2018 00:19   Dg Abd Portable 1v  Result Date: 08/28/2018 CLINICAL DATA:  Orogastric tube placement. EXAM: PORTABLE ABDOMEN - 1 VIEW COMPARISON:  None. FINDINGS: The patient's enteric tube is seen ending overlying the body of the stomach, with the side port about the fundus of the stomach. The visualized bowel gas pattern is grossly unremarkable. A small left pleural effusion is noted. Increased interstitial markings raise concern for pulmonary edema. Vascular congestion is noted. The patient's endotracheal tube is seen ending 2-3 cm above the carina. No acute osseous abnormalities are identified. IMPRESSION: 1. Enteric tube seen ending overlying the body of the stomach, with the side port about the fundus of the stomach. 2. Small left pleural effusion noted. Increased interstitial markings raise concern for pulmonary edema. Electronically Signed   By: Roanna Raider M.D.   On: 08/28/2018 22:08   Ir Percutaneous Art Thrombectomy/infusion Intracranial Inc Diag Angio  Result Date: 08/31/2018 CLINICAL DATA:  New onset right-sided weakness with mild word-finding difficulties. 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 EXAM: IR ANGIO INTRA EXTRACRAN SEL COM CAROTID INNOMINATE UNI RIGHT MOD SED; IR PERCUTANEOUS ART THORMBECTOMY/INFUSION INTRACRANIAL INCLUDE DIAG ANGIO; IR ANGIO VERTEBRAL  SEL SUBCLAVIAN INNOMINATE UNI LEFT MOD SED; IR CT HEAD LIMITED COMPARISON:  CT angiogram of the head and neck of 08/27/2018 MEDICATIONS: Heparin 3,000 units IV; . Ancef 2 g IV antibiotic was administered within 1 hour of the procedure ANESTHESIA/SEDATION: General anesthesia CONTRAST:  Isovue 300 approximately 100 mL FLUOROSCOPY TIME:  Fluoroscopy Time: 60 minutes 0 seconds (4327 mGy). COMPLICATIONS: None immediate. TECHNIQUE: Informed written consent was obtained from the patient after a thorough discussion of the procedural risks, benefits and alternatives. All questions were addressed. Maximal Sterile Barrier Technique was utilized including caps, mask, sterile gowns, sterile gloves, sterile drape, hand hygiene and skin antiseptic. A timeout was performed prior to the initiation of the procedure. The right groin was prepped and draped in the usual sterile fashion. Thereafter using modified Seldinger technique, transfemoral access into the right common femoral artery was obtained without difficulty. Over a 0.035 inch guidewire, a 5 French Pinnacle sheath was inserted. Through this, and also over 0.035 inch guidewire, a 5 [place] JB 1 catheter was advanced to the aortic arch region and selectively positioned in the right common carotid artery, the innominate artery , the left common carotid artery and the left vertebral artery. FINDINGS: The left common carotid artery tear g demonstrates a moderate tortuosity of the proximal left common carotid artery The left external carotid artery proximally has a mild to moderate stenosis. Is branches are otherwise normally opacified The left internal carotid artery at the bulb would demonstrates the complete angiographic occlusion with no evidence of a string sign on the delayed arterial images. The innominate artery arteriogram demonstrates patency of the right subclavian artery and the right  common carotid artery proximally There is no angiographic evidence of the right  vertebral artery The right common carotid bifurcation demonstrates the right external carotid artery and its major branches to be widely patent The right internal carotid artery just distal to the bulb has a severe 90% plus stenosis. Distal to this there is a U-shaped configuration of the right internal carotid artery. More distally the vessel is normal caliber The petrous the cavernous segments and supraclinoid segments are widely patent There is mild atherosclerotic irregularity involving the proximal cavernous, and distal cavernous portions of the right internal carotid artery. Right posterior communicating artery is seen opacifying the right posterior cerebral artery and the distal basilar artery. The right middle cerebral artery has approximately 30 40% stenosis of the right M1 segment The trifurcation branches appear to be widely patent into the capillary and venous phases The right anterior cerebral artery A1 segment and distally demonstrate wide patency is into the capillary and venous phases There is simultaneous cross-filling via the anterior communicating artery of the left anterior cerebral artery A2 segment and distally. Also demonstrated is opacification of the left anterior cerebral A1 segment, with flow noted into the left middle cerebral artery distribution. Unopacified blood is seen in the left middle cerebral artery from the collateral circulation. 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 More distally the vessel is seen to opacify to the cranial skull base There is severe focal of focal stenosis of the left vertebrobasilar junction just distal to the origin of the left posterior-inferior cerebellar artery  Additionally there is a approximately 70% stenosis of the proximal basilar artery More distally the basilar artery demonstrates mild caliber irregularity. There is a opacification of the left posterior cerebral artery the superior cerebellar arteries left greater than right, and the anterior-inferior cerebellar arteries Retrograde opacification of the right vertebrobasilar junction to the level of the right posterior-inferior cerebellar arteries is noted. Caliber irregularity of the right posterior-inferior cerebellar artery suggests intracranial arteriosclerosis. Left internal carotid artery occlusion or revascularization attempt The diagnostic JB 1 catheter left common carotid artery was then exchanged over a 0.035 inch 300 cm a 7 French Pinnacle sheath in the right groin. A diagnostic JB 1 catheter were then advanced over the rise exchange guidewire to the descending thoracic aorta. The 7 French Pinnacle sheath was then exchanged over a 0.035 inch 100 cm Amplatz stiff guidewire for a a 8 French 55 cm Brite tip neurovascular sheath using biplane roadmap technique and constant fluoroscopic guidance. Good good aspiration was obtained from the side port of the neurovascular sheath. This was then connected to continuous heparinized saline infusion. The embolus guidewire was then removed. Through the 8 French neurovascular sheath, over a 0.035 inch roadrunner guidewire, a 5 5 [place] JB 1 catheter was advanced to the recover region and select position in the left common carotid artery. Using biplane technique and constant fluoroscopic guidance, over the of the 5 inch roadrunner guidewire, the 5 [place] JB 1 catheter was advanced to the left external carotid artery and exchanged over a 0.035 inch 300 cm rise exchange guidewire for an 8 French 85 cm flow gap balloon guide catheter which had been prepped with 50% contrast and 50% heparinized saline infusion The guidewire was removed. Good aspiration  obtained from the hub of  the 8 [place] Flo guide catheter. Gentle contrast injection demonstrated no evidence of spasms dissections or of intraluminal filling defects. At this time, in a coaxial manner and with constant heparinized saline infusion, a tree approached 1 microcatheter was advanced over a point 0.014 inch soft tip synchro micro guidewire to the distal end of the Flo guide catheter The Flood guide catheter was advanced to just inside the left internal carotid artery bulb. Thereafter, multiple attempts were made to advance the microcatheter over the micro guidewire first with Ace synchro soft and then a regular synchro guidewire. In 016 headliner double angled micro guidewire was also utilized in order to access the occluded left internal carotid artery. It was noted that there was a severe tortuosity at the junction of the proximal and the middle 1/3 of the left internal carotid artery Following multiple attempts it was decided to stop. A control was then performed through the [facility] guide catheter in the left common carotid artery which revealed brisk cross-filling of the left internal carotid artery distal cavernous and supraclinoid segments from the left external carotid artery branches via the ipsilateral ophthalmic artery Flow is noted into the left middle cerebral artery as well Also noted was a retrograde opacification from the left posterior communicating artery into the supraclinoid left ICA and then middle cerebral artery An earlier diagnostic catheter arteriogram of the right common carotid artery he had revealed partial cross-filling via the anterior communicating artery from the right internal carotid artery Given the the collateral circulation, it was decided to stop the procedure. Throughout the procedure, the patient's blood pressure and neurological status remained stable No evidence of hemodynamic instability was noted No gross mass-effect or move filling defects or extravasation was seen Prior to the attempted  revascularization of the left internal carotid artery occlusion, the patient was given 180 mg of Brilinta, and 81 mg of aspirin orally the an orogastric tube. Also the patient was loaded with 3000 units IV heparin in order to facilitate revascularization. The the 8 [place] flu guide catheter was then retrieved and removed. The 8 [place] Brite tip neurovascular sheath was then exchanged over a J-tip guidewire for a in 8 French 35 cm Brite tip neurovascular sheath. This was then connected to continuous heparinized saline infusion An arteriogram performed through this 3 5 cm 8 French Brite tip neurovascular sheath in the abdominal aorta reveal excellent flow through the common iliacs, the external and internal iliacs, and also the visualized portions of the common femoral arteries below the level of the inguinal ligaments. At the end of procedure, a CT of the brain revealed no evidence of gross intracranial hemorrhage,, or mass effect or midline shift. The patient was left intubated on account of his wound at the time of his intubation The right groin appeared soft without evidence of a hematoma. Distal pulses in the dorsalis pedis, and posterior regions remained dopplerable bilaterally. The patient was then transferred to the neuro ICU for further post stroke management. IMPRESSION: . 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 Approximately 90% stenosis of the dominant left vertebrobasilar junction distal to the origin of the left posterior-inferior cerebellar artery, and of approximately 70% of the proximal basilar artery. 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 PLAN: Follow-up in the clinic 1 month post discharge Electronically Signed   By: Julieanne Cotton M.D.   On:  08/28/2018 16:08   Ct Head Code Stroke Wo Contrast`  Result Date:  08/27/2018 CLINICAL DATA:  Code stroke. Slurred speech beginning 3 weeks ago. Right-sided weakness beginning today. EXAM: CT HEAD WITHOUT CONTRAST TECHNIQUE: Contiguous axial images were obtained from the base of the skull through the vertex without intravenous contrast. COMPARISON:  CT 03/07/2013 FINDINGS: Brain: The brainstem and cerebellum appear normal by CT. Cerebral hemispheres show age related volume loss. There are old cortical and subcortical infarctions in the right frontal lobe, left parietal vertex, left frontal lobe, and left frontoparietal vertex. The show atrophy and encephalomalacia with gliosis. There is no finding of acute or subacute infarction on this CT. There is old lacunar infarction in the left caudate. Vascular: There is atherosclerotic calcification of the major vessels at the base of the brain. No evidence of acute hyperdense vessel. Skull: Negative Sinuses/Orbits: Clear/normal Other: None ASPECTS ([place] Stroke Program Early CT Score) Aspects is difficult in this patient with old infarctions. I do not suspect an acute insult. IMPRESSION: 1. No acute finding by CT. 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 2. 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 No acute finding is suspected by CT. 3. These results were called by telephone at the time of interpretation on 08/27/2018 at 11:47 am to Dr. Daryel NovemberJONATHAN WILLIAMS , who verbally acknowledged these results. Electronically Signed   By: Paulina FusiMark  Shogry M.D.   On: 08/27/2018 11:49   Ir Angio Intra Extracran Sel Com Carotid Innominate Uni R Mod Sed  Result Date: 08/31/2018 CLINICAL DATA:  New onset right-sided weakness with mild word-finding difficulties. 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 EXAM: IR ANGIO INTRA EXTRACRAN SEL COM CAROTID INNOMINATE UNI RIGHT MOD SED; IR PERCUTANEOUS ART THORMBECTOMY/INFUSION INTRACRANIAL INCLUDE DIAG ANGIO; IR ANGIO VERTEBRAL SEL SUBCLAVIAN INNOMINATE UNI LEFT MOD SED; IR CT HEAD LIMITED COMPARISON:  CT angiogram of the head and neck of 08/27/2018 MEDICATIONS: Heparin 3,000 units IV; . Ancef 2 g IV antibiotic was administered within 1 hour of the procedure ANESTHESIA/SEDATION: General anesthesia CONTRAST:  Isovue 300 approximately 100 mL FLUOROSCOPY TIME:  Fluoroscopy Time: 60 minutes 0 seconds (4327 mGy). COMPLICATIONS: None immediate. TECHNIQUE: Informed written consent was obtained from the patient after a thorough discussion of the procedural risks, benefits and alternatives. All questions were addressed. Maximal Sterile Barrier Technique was utilized including caps, mask, sterile gowns, sterile gloves, sterile drape, hand hygiene and skin antiseptic. A timeout was performed prior to the initiation of the procedure. The right groin was prepped and draped in the usual sterile fashion. Thereafter using modified Seldinger technique, transfemoral access into the right common femoral artery was obtained without difficulty. Over a 0.035 inch guidewire, a 5 French Pinnacle sheath was inserted. Through this, and also over 0.035 inch guidewire, a 5 [place] JB 1 catheter was advanced to the aortic arch region and selectively positioned in the right common carotid artery, the innominate artery , the left common carotid artery and the left vertebral artery. FINDINGS: The left common carotid artery tear g demonstrates a moderate tortuosity of the proximal left common carotid artery The left external carotid artery proximally has a mild to moderate stenosis. Is branches are otherwise normally opacified The left internal carotid artery at the bulb would demonstrates the complete angiographic  occlusion with no evidence of a string sign on the delayed  arterial images. The innominate artery arteriogram demonstrates patency of the right subclavian artery and the right common carotid artery proximally There is no angiographic evidence of the right vertebral artery The right common carotid bifurcation demonstrates the right external carotid artery and its major branches to be widely patent The right internal carotid artery just distal to the bulb has a severe 90% plus stenosis. Distal to this there is a U-shaped configuration of the right internal carotid artery. More distally the vessel is normal caliber The petrous the cavernous segments and supraclinoid segments are widely patent There is mild atherosclerotic irregularity involving the proximal cavernous, and distal cavernous portions of the right internal carotid artery. Right posterior communicating artery is seen opacifying the right posterior cerebral artery and the distal basilar artery. The right middle cerebral artery has approximately 30 40% stenosis of the right M1 segment The trifurcation branches appear to be widely patent into the capillary and venous phases The right anterior cerebral artery A1 segment and distally demonstrate wide patency is into the capillary and venous phases There is simultaneous cross-filling via the anterior communicating artery of the left anterior cerebral artery A2 segment and distally. Also demonstrated is opacification of the left anterior cerebral A1 segment, with flow noted into the left middle cerebral artery distribution. Unopacified blood is seen in the left middle cerebral artery from the collateral circulation. 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 More distally the vessel is  seen to opacify to the cranial skull base There is severe focal of focal stenosis of the left vertebrobasilar junction just distal to the origin of the left posterior-inferior cerebellar artery Additionally there is a approximately 70% stenosis of the proximal basilar artery More distally the basilar artery demonstrates mild caliber irregularity. There is a opacification of the left posterior cerebral artery the superior cerebellar arteries left greater than right, and the anterior-inferior cerebellar arteries Retrograde opacification of the right vertebrobasilar junction to the level of the right posterior-inferior cerebellar arteries is noted. Caliber irregularity of the right posterior-inferior cerebellar artery suggests intracranial arteriosclerosis. Left internal carotid artery occlusion or revascularization attempt The diagnostic JB 1 catheter left common carotid artery was then exchanged over a 0.035 inch 300 cm a 7 French Pinnacle sheath in the right groin. A diagnostic JB 1 catheter were then advanced over the rise exchange guidewire to the descending thoracic aorta. The 7 French Pinnacle sheath was then exchanged over a 0.035 inch 100 cm Amplatz stiff guidewire for a a 8 French 55 cm Brite tip neurovascular sheath using biplane roadmap technique and constant fluoroscopic guidance. Good good aspiration was obtained from the side port of the neurovascular sheath. This was then connected to continuous heparinized saline infusion. The embolus guidewire was then removed. Through the 8 French neurovascular sheath, over a 0.035 inch roadrunner guidewire, a 5 5 [place] JB 1 catheter was advanced to the recover region and select position in the left common carotid artery. Using biplane technique and constant fluoroscopic guidance, over the of the 5 inch roadrunner guidewire, the 5 [place] JB 1 catheter was advanced to the left external carotid artery and exchanged over a 0.035 inch 300 cm rise exchange guidewire for  an 8 French 85 cm flow gap balloon guide catheter which had been prepped with 50% contrast and 50% heparinized saline infusion The guidewire was removed. Good aspiration obtained from the hub of the 8 [place] Flo guide catheter. Gentle contrast injection demonstrated no evidence of spasms dissections or of intraluminal filling defects. At this time, in a coaxial manner and with constant heparinized saline infusion, a tree approached 1 microcatheter was advanced over a point 0.014 inch soft tip synchro micro guidewire to the distal end of the Flo guide catheter The Flood guide catheter was advanced to just inside the left internal carotid artery bulb. Thereafter, multiple attempts were made to advance the microcatheter over the micro guidewire first with Ace synchro soft and then a regular synchro guidewire. In 016 headliner double angled micro guidewire was also utilized in order to access the occluded left internal carotid artery. It was noted that there was a severe tortuosity at the junction of the proximal and the middle 1/3 of the left internal carotid artery Following multiple attempts it was decided to stop. A control was then performed through the [facility] guide catheter in the left common carotid artery which revealed brisk cross-filling of the left internal carotid artery distal cavernous and supraclinoid segments from the left external carotid artery branches via the ipsilateral ophthalmic artery Flow is noted into the left middle cerebral artery as well Also noted was a retrograde opacification from the left posterior communicating artery into the supraclinoid left ICA and then middle cerebral artery An earlier diagnostic catheter arteriogram of the right common carotid artery he had revealed partial cross-filling via the anterior communicating artery from the right internal carotid artery Given the the collateral circulation, it was decided to stop the procedure. Throughout the procedure, the patient's blood  pressure and neurological status remained stable No evidence of hemodynamic instability was noted No gross mass-effect or move filling defects or extravasation was seen Prior to the attempted revascularization of the left internal carotid artery occlusion, the patient was given 180 mg of Brilinta, and 81 mg of aspirin orally the an orogastric tube. Also the patient was loaded with 3000 units IV heparin in order to facilitate revascularization. The the 8 [place] flu guide catheter was then retrieved and removed. The 8 [place] Brite tip neurovascular sheath was then exchanged over a J-tip guidewire for a in 8 French 35 cm Brite tip neurovascular sheath. This was then connected to continuous heparinized saline infusion An arteriogram performed through this 3 5 cm 8 French Brite tip neurovascular sheath in the abdominal aorta reveal excellent flow through the common iliacs, the external and internal iliacs, and also the visualized portions of the common femoral arteries below the level of the inguinal ligaments. At the end of procedure, a CT of the brain revealed no evidence of gross intracranial hemorrhage,, or mass effect or midline shift. The patient was left intubated on account of his wound at the time of his intubation The right groin appeared soft without evidence of a hematoma. Distal pulses in the dorsalis pedis, and posterior regions remained dopplerable bilaterally. The patient was then transferred to the neuro ICU for further post stroke management. IMPRESSION: . 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 Approximately 90% stenosis of the dominant left vertebrobasilar junction distal to the origin of the left posterior-inferior cerebellar artery, and of approximately 70% of the proximal  basilar artery. 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  PLAN: Follow-up in the clinic 1 month post discharge Electronically Signed   By: Julieanne Cotton M.D.   On: 08/28/2018 16:08   Ir Angio Vertebral Sel Subclavian Innominate Uni L Mod Sed  Result Date: 08/31/2018 CLINICAL DATA:  New onset right-sided weakness with mild word-finding difficulties. 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 EXAM: IR ANGIO INTRA EXTRACRAN SEL COM CAROTID INNOMINATE UNI RIGHT MOD SED; IR PERCUTANEOUS ART THORMBECTOMY/INFUSION INTRACRANIAL INCLUDE DIAG ANGIO; IR ANGIO VERTEBRAL SEL SUBCLAVIAN INNOMINATE UNI LEFT MOD SED; IR CT HEAD LIMITED COMPARISON:  CT angiogram of the head and neck of 08/27/2018 MEDICATIONS: Heparin 3,000 units IV; . Ancef 2 g IV antibiotic was administered within 1 hour of the procedure ANESTHESIA/SEDATION: General anesthesia CONTRAST:  Isovue 300 approximately 100 mL FLUOROSCOPY TIME:  Fluoroscopy Time: 60 minutes 0 seconds (4327 mGy). COMPLICATIONS: None immediate. TECHNIQUE: Informed written consent was obtained from the patient after a thorough discussion of the procedural risks, benefits and alternatives. All questions were addressed. Maximal Sterile Barrier Technique was utilized including caps, mask, sterile gowns, sterile gloves, sterile drape, hand hygiene and skin antiseptic. A timeout was performed prior to the initiation of the procedure. The right groin was prepped and draped in the usual sterile fashion. Thereafter using modified Seldinger technique, transfemoral access into the right common femoral artery was obtained without difficulty. Over a 0.035 inch guidewire, a 5 French Pinnacle sheath was inserted. Through this, and also over 0.035 inch guidewire, a 5 [place] JB 1 catheter was advanced to the aortic arch region and selectively positioned in the right common carotid artery, the  innominate artery , the left common carotid artery and the left vertebral artery. FINDINGS: The left common carotid artery tear g demonstrates a moderate tortuosity of the proximal left common carotid artery The left external carotid artery proximally has a mild to moderate stenosis. Is branches are otherwise normally opacified The left internal carotid artery at the bulb would demonstrates the complete angiographic occlusion with no evidence of a string sign on the delayed arterial images. The innominate artery arteriogram demonstrates patency of the right subclavian artery and the right common carotid artery proximally There is no angiographic evidence of the right vertebral artery The right common carotid bifurcation demonstrates the right external carotid artery and its major branches to be widely patent The right internal carotid artery just distal to the bulb has a severe 90% plus stenosis. Distal to this there is a U-shaped configuration of the right internal carotid artery. More distally the vessel is normal caliber The petrous the cavernous segments and supraclinoid segments are widely patent There is mild atherosclerotic irregularity involving the proximal cavernous, and distal cavernous portions of the right internal carotid artery. Right posterior communicating artery is seen opacifying the right posterior cerebral artery and the distal basilar artery. The right middle cerebral artery has approximately 30 40% stenosis of the right M1 segment The trifurcation branches appear to be widely patent into the capillary and venous phases The right anterior cerebral artery A1 segment and distally demonstrate wide patency is into the capillary and venous phases There is simultaneous cross-filling via the anterior communicating artery of the left anterior cerebral artery A2 segment and distally. Also demonstrated is opacification of the left anterior cerebral A1 segment, with flow noted into the left  middle  cerebral artery distribution. Unopacified blood is seen in the left middle cerebral artery from the collateral circulation. 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 More distally the vessel is seen to opacify to the cranial skull base There is severe focal of focal stenosis of the left vertebrobasilar junction just distal to the origin of the left posterior-inferior cerebellar artery Additionally there is a approximately 70% stenosis of the proximal basilar artery More distally the basilar artery demonstrates mild caliber irregularity. There is a opacification of the left posterior cerebral artery the superior cerebellar arteries left greater than right, and the anterior-inferior cerebellar arteries Retrograde opacification of the right vertebrobasilar junction to the level of the right posterior-inferior cerebellar arteries is noted. Caliber irregularity of the right posterior-inferior cerebellar artery suggests intracranial arteriosclerosis. Left internal carotid artery occlusion or revascularization attempt The diagnostic JB 1 catheter left common carotid artery was then exchanged over a 0.035 inch 300 cm a 7 French Pinnacle sheath in the right groin. A diagnostic JB 1 catheter were then advanced over the rise exchange guidewire to the descending thoracic aorta. The 7 French Pinnacle sheath was then exchanged over a 0.035 inch 100 cm Amplatz stiff guidewire for a a 8 French 55 cm Brite tip neurovascular sheath using biplane roadmap technique and constant fluoroscopic guidance. Good good aspiration was obtained from the side port of the neurovascular sheath. This was then connected to continuous heparinized saline infusion. The  embolus guidewire was then removed. Through the 8 French neurovascular sheath, over a 0.035 inch roadrunner guidewire, a 5 5 [place] JB 1 catheter was advanced to the recover region and select position in the left common carotid artery. Using biplane technique and constant fluoroscopic guidance, over the of the 5 inch roadrunner guidewire, the 5 [place] JB 1 catheter was advanced to the left external carotid artery and exchanged over a 0.035 inch 300 cm rise exchange guidewire for an 8 French 85 cm flow gap balloon guide catheter which had been prepped with 50% contrast and 50% heparinized saline infusion The guidewire was removed. Good aspiration obtained from the hub of the 8 [place] Flo guide catheter. Gentle contrast injection demonstrated no evidence of spasms dissections or of intraluminal filling defects. At this time, in a coaxial manner and with constant heparinized saline infusion, a tree approached 1 microcatheter was advanced over a point 0.014 inch soft tip synchro micro guidewire to the distal end of the Flo guide catheter The Flood guide catheter was advanced to just inside the left internal carotid artery bulb. Thereafter, multiple attempts were made to advance the microcatheter over the micro guidewire first with Ace synchro soft and then a regular synchro guidewire. In 016 headliner double angled micro guidewire was also utilized in order to access the occluded left internal carotid artery. It was noted that there was a severe tortuosity at the junction of the proximal and the middle 1/3 of the left internal carotid artery Following multiple attempts it was decided to stop. A control was then performed [facility] the [facility] in the left common carotid artery which revealed brisk cross-filling of the left internal carotid artery distal cavernous and supraclinoid segments from the left external carotid artery branches via the ipsilateral ophthalmic artery Flow is  noted into the left middle  cerebral artery as well Also noted was a retrograde opacification from the left posterior communicating artery into the supraclinoid left ICA and then middle cerebral artery An earlier diagnostic catheter arteriogram of the right common carotid artery he had revealed partial cross-filling via the anterior communicating artery from the right internal carotid artery Given the the collateral circulation, it was decided to stop the procedure. Throughout the procedure, the patient's blood pressure and neurological status remained stable No evidence of hemodynamic instability was noted No gross mass-effect or move filling defects or extravasation was seen Prior to the attempted revascularization of the left internal carotid artery occlusion, the patient was given 180 mg of Brilinta, and 81 mg of aspirin orally the an orogastric tube. Also the patient was loaded with 3000 units IV heparin in order to facilitate revascularization. The the 8 [place] flu guide catheter was then retrieved and removed. The 8 [place] Brite tip neurovascular sheath was then exchanged over a J-tip guidewire for a in 8 French 35 cm Brite tip neurovascular sheath. This was then connected to continuous heparinized saline infusion An arteriogram performed through this 3 5 cm 8 French Brite tip neurovascular sheath in the abdominal aorta reveal excellent flow through the common iliacs, the external and internal iliacs, and also the visualized portions of the common femoral arteries below the level of the inguinal ligaments. At the end of procedure, a CT of the brain revealed no evidence of gross intracranial hemorrhage,, or mass effect or midline shift. The patient was left intubated on account of his wound at the time of his intubation The right groin appeared soft without evidence of a hematoma. Distal pulses in the dorsalis pedis, and posterior regions remained dopplerable bilaterally. The patient was then transferred to the neuro ICU for further post  stroke management. IMPRESSION: . 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 Approximately 90% stenosis of the dominant left vertebrobasilar junction distal to the origin of the left posterior-inferior cerebellar artery, and of approximately 70% of the proximal basilar artery. 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 PLAN: Follow-up in the clinic 1 month post discharge Electronically Signed   By: Julieanne Cotton M.D.   On: 08/28/2018 16:08   Vas [place] Lower Extremity Arterial Duplex  Result Date: 09/04/2018 LOWER EXTREMITY ARTERIAL DUPLEX STUDY  Current ABI: Not obtained Limitations: Cerebral angiogram 08/27/18, now with bleeding, pain, and bruising              at groin stick site. Performing Technologist: Gertie Fey MHA, RDMS, RVT, RDCS  Examination Guidelines: A complete evaluation includes B-mode imaging, spectral Doppler, color Doppler, and power Doppler as needed of all accessible portions of each vessel. Bilateral testing is considered an integral part of a complete examination. Limited examinations for reoccurring indications may be performed as noted.  Right Duplex Findings: +----------+--------+-----+--------+----------+--------+           PSV cm/sRatioStenosisWaveform  Comments +----------+--------+-----+--------+----------+--------+ CFA Prox  102                  monophasic         +----------+--------+-----+--------+----------+--------+ CFA Distal90                   monophasic         +----------+--------+-----+--------+----------+--------+ PTA Distal32  monophasic         +----------+--------+-----+--------+----------+--------+ DP        44                   monophasic         +----------+--------+-----+--------+----------+--------+  Summary: Right: A right common femoral  artery pseudoaneurysm was identified measuring 2.8 x 1.5 x 2.5 cm, with a neck measuring 1.3cm long x 0.6cm wide. Pseudoaneurysm measures 2.1cm from the surface of the skin. The proximal right common femoral artery exhibits a monophasic waveform, suggestive of possible aortoiliac obstruction. Distal posterior tibial artery exhibits monophasic flow, and the right dorsalis pedis artery exhibits monophasic flow, suggestive of proximal obstruction.  See table(s) above for measurements and observations.    Preliminary     PHYSICAL EXAM  Temp:  [98 F (36.7 C)-99.1 F (37.3 C)] 99 F (37.2 C) (01/05 1236) Pulse Rate:  [63-130] 98 (01/05 1236) Resp:  [20-24] 22 (01/05 1236) BP: (105-141)/(48-65) 108/60 (01/05 1236) SpO2:  [90 %-98 %] 98 % (01/05 1236)  General - Well nourished, well developed elderly Caucasian male,   Ophthalmologic - fundi not visualized due to noncooperation.  Cardiovascular - irregularly irregular heart rate and rhythm.  Abdomen - right lower abdominal wound still oozing blood  Neuro - awake alert,  able to following simple commands. Able to repeat sentences and spontaneous speech but paucity of speech. PERRLA, EOMI, able to track objects on the both sides, inconsistent blinking to visual threat bilaterally.  Facial symmetric.  Tongue midline.  Left upper extremity 4/5.  Right lower extremity and right upper extremity flaccid.  Left lower extremity proximal 2+/5 and distal 3/5.  DTR 1+, Babinski negative.  Sensation symmetrical, coordination slow but intact on the left finger-to-nose and gait not tested.   ASSESSMENT/PLAN Mr.Tu Jamahl Lemmons a 78 y.o.malewith history of HTN, atrial fibrillation on Pradaxa last dose last evening, urethral stricture, cardiomyopathy, peripheral vascular disease, hypertension, hyperlipidemia, stroke with some balance issues, bilateral lymphedema vitamin D deficiencypresenting with right lower extremity numbness and weaknessand suspected  new Lt ICA occlusion.Hedid not receive IV t-PA due to late presentation.  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  MRI head -left MCA and ACA territory scattered small infarcts  CTA H&N - left ICA, right VA occlusion.  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  2D Echo - EF 60 to 65%  LDL- 81  HgbA1c- 7.1  VTE prophylaxis -SCDs  aspirin 81 mg daily and Pradaxa (dabigatran) twice a dayprior to admission, now on Eliquis 5 mg twice daily  Ongoing aggressive stroke risk factor management  Therapy recommendations: SNF  Disposition: Pending  Right femoral A. 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 early on board  No intervention needed at this time  Continue dressing drainage  Pseudoaneurysm need to be addressed in the future with thrombin injection  Aspiration pneumonitis  Fever Tmax 101.2->101.3-> afebrile->101.3-> afebrile  Leukocytosis WBC 22.2-15.5-11.4-13.9->16.6-> 19.2->14.7->18.6  CXR CHF  Off lasix  Sputum culture normal flora  Off Zosyn today  UA negative  Respiratory distress  Was intubated on ventilation  Self extubated on 09/01/18  Off sedation  So far on nasal cannula, tolerating  CCM signed off  Atrial fibrillation, chronic  On Pradaxa PTA, compliant with medication  On metoprolol, rate controlled  On Eliquis and amiodarone po  Rate controlled  Cardiology signed  off  Hypotension History of hypertension  BP stable   Off neo  On metoprolol  Avoid hypotension due to 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 as well as basal artery stenosis  BP goal 120-160  CHF  CXR showed CHF  Fluid overload - off lasix now  Anasarca much improved  Cardiology signed off  Hyperlipidemia  Lipid lowering medication ONG:EXBM  LDL81, goal < 70  Current lipid lowering medication:none (statin intolerant - abnormal labs)  Add zetia   Continue zetia on d/c  Diabetes  HgbA1c7.1, goal < 7.0  Uncontrolled  Hyperglycemia improved  SSI  CBG monitoring  On Levemir, continue  Intermittent agitation  Likely due to aphasia, right hemiparesis and discomfort position with obesity  Better with family in room  Started on low dose seroquel 12.5mg  bid  Close monitoring  Other Stroke Risk Factors  Advanced age  Former cigarette smoker - quit  Obesity,Body mass index is 47.79 kg/m., recommend weight loss, diet and exercise as appropriate   Hx stroke/TIA  PVD  Other Active Problems  Urethral stricture -on Foley  Diffuse anasarca - much improved, continue diuresis one more day  Hypokalemia potassium 2.8->3.3, supplement  Low Magnesium 1.8 - supplement  Hospital day # 11 Continue ongoing treatment.continue CPAP at night for his sleep apnea. May use Haldol when necessary for agitation and will low-dose Seroquel at night for agitation and sleep Await transfer to skilled nursing facility when bed available.discussed with wife at the bedside and answered questions. Greater than 50% time during this 25 minute visit was spent on counseling and coordination of care about his stroke  And agitation and answering questions   Delia Heady, M.D. Stroke Neurology 09/07/2018 2:48 PM     To contact Stroke Continuity provider, please refer to [email or web address]. After hours, contact General Neurology

## 2018-09-07 NOTE — Progress Notes (Signed)
Pt rested for about 3 hers after fentanyl given for pain but became combative and agitated  During am lab work.  Repositioned and made comfortable in bed. . Dressing changed to tr groin area and to multiple open blister and wound on the hand. bp low after 430 but improved to 106/74 this am.

## 2018-09-08 ENCOUNTER — Inpatient Hospital Stay (HOSPITAL_COMMUNITY): Payer: Medicare HMO

## 2018-09-08 DIAGNOSIS — I959 Hypotension, unspecified: Secondary | ICD-10-CM

## 2018-09-08 DIAGNOSIS — Z9989 Dependence on other enabling machines and devices: Secondary | ICD-10-CM

## 2018-09-08 DIAGNOSIS — G4733 Obstructive sleep apnea (adult) (pediatric): Secondary | ICD-10-CM

## 2018-09-08 DIAGNOSIS — R0602 Shortness of breath: Secondary | ICD-10-CM

## 2018-09-08 LAB — BASIC METABOLIC PANEL
Anion gap: 11 (ref 5–15)
BUN: 18 mg/dL (ref 8–23)
CO2: 27 mmol/L (ref 22–32)
Calcium: 8.9 mg/dL (ref 8.9–10.3)
Chloride: 103 mmol/L (ref 98–111)
Creatinine, Ser: 1.33 mg/dL — ABNORMAL HIGH (ref 0.61–1.24)
GFR calc Af Amer: 59 mL/min — ABNORMAL LOW (ref >60)
GFR calc non Af Amer: 51 mL/min — ABNORMAL LOW (ref >60)
Glucose, Bld: 152 mg/dL — ABNORMAL HIGH (ref 70–99)
POTASSIUM: 4 mmol/L (ref 3.5–5.1)
Sodium: 141 mmol/L (ref 135–145)

## 2018-09-08 LAB — GLUCOSE, CAPILLARY
Glucose-Capillary: 150 mg/dL — ABNORMAL HIGH (ref 70–99)
Glucose-Capillary: 142 mg/dL — ABNORMAL HIGH (ref 70–99)
Glucose-Capillary: 149 mg/dL — ABNORMAL HIGH (ref 70–99)
Glucose-Capillary: 164 mg/dL — ABNORMAL HIGH (ref 70–99)

## 2018-09-08 LAB — CBC
HCT: 31.5 % — ABNORMAL LOW (ref 39.0–52.0)
Hemoglobin: 10 g/dL — ABNORMAL LOW (ref 13.0–17.0)
MCH: 32.6 pg (ref 26.0–34.0)
MCHC: 31.7 g/dL (ref 30.0–36.0)
MCV: 102.6 fL — ABNORMAL HIGH (ref 80.0–100.0)
Platelets: 495 10*3/uL — ABNORMAL HIGH (ref 150–400)
RBC: 3.07 MIL/uL — ABNORMAL LOW (ref 4.22–5.81)
RDW: 15.7 % — ABNORMAL HIGH (ref 11.5–15.5)
WBC: 12.1 10*3/uL — ABNORMAL HIGH (ref 4.0–10.5)
nRBC: 0.7 % — ABNORMAL HIGH (ref 0.0–0.2)

## 2018-09-08 MED ORDER — METOPROLOL TARTRATE 25 MG PO TABS
25.0000 mg | ORAL_TABLET | Freq: Two times a day (BID) | ORAL | Status: DC
  Administered 2018-09-08 – 2018-09-15 (×14): 25 mg via ORAL
  Filled 2018-09-08 (×7): qty 1
  Filled 2018-09-08: qty 2
  Filled 2018-09-08 (×6): qty 1

## 2018-09-08 MED ORDER — QUETIAPINE FUMARATE 25 MG PO TABS
25.0000 mg | ORAL_TABLET | Freq: Every day | ORAL | Status: DC
  Administered 2018-09-08: 25 mg via ORAL
  Filled 2018-09-08: qty 1

## 2018-09-08 MED ORDER — SODIUM CHLORIDE 0.9 % IV SOLN
INTRAVENOUS | Status: DC
  Administered 2018-09-08 – 2018-09-13 (×4): via INTRAVENOUS

## 2018-09-08 NOTE — Progress Notes (Signed)
SLP Cancellation Note  Patient Details Name: Darren Frazier MRN: [id number] DOB: [birth year]   Cancelled treatment:       Reason Eval/Treat Not Completed: Other (comment)Entered room to find pt had pulled off all leads and condom catheter; bed soiled. Staff arrived to give pt bath.  Will continue efforts.    Blenda Mounts [place] 09/08/2018, 4:26 PM

## 2018-09-08 NOTE — Clinical Social Work Note (Signed)
Clinical Social Work Assessment  Patient Details  Name: Darren Frazier MRN: [id number] Date of Birth: [birth year]  Date of referral:  09/08/18               Reason for consult:  Facility Placement                Permission sought to share information with:  Facility Sport and exercise psychologist, Family Supports Permission granted to share information::  Yes, Verbal Permission Granted  Name::     Chartered certified accountant::  SNF  Relationship::  Wife  Contact Information:     Housing/Transportation Living arrangements for the past 2 months:  Single Family Home Source of Information:  Spouse, Medical Team Patient Interpreter Needed:  None Criminal Activity/Legal Involvement Pertinent to Current Situation/Hospitalization:  No - Comment as needed Significant Relationships:  Spouse Lives with:  Self, Spouse Do you feel safe going back to the place where you live?  Yes Need for family participation in patient care:  Yes (Comment)  Care giving concerns:  Patient from home with wife, but will need short term rehab at discharge.    Social Worker assessment / plan:  CSW met with patient's wife to discuss recommendation for SNF placement. CSW explained expectations and answered wife's questions. Wife agreed that she can't take care of him like this at home, and would appreciate any help in finding the patient what he needs. Patient's wife requested placement in [place], as that is where she lives. CSW received permission to fax out referral and will follow up with offers.  Employment status:  Retired Nurse, adult PT Recommendations:  [place] / Referral to community resources:  [place]  Patient/Family's Response to care:  Patient unable to participate in conversation; he attempted to respond when CSW greeted him, but due to expressive aphasia was unable to form a coherent sentence. Patient's wife agreeable to SNF placement as she  cannot take him home.  Patient/Family's Understanding of and Emotional Response to Diagnosis, Current Treatment, and Prognosis:  Patient's response limited due to expressive aphasia. Patient's wife aware of deficits and concerned that he may never be able to come home given how severely he was affected, and how much help he's needing at this time.  Emotional Assessment Appearance:  Appears stated age Attitude/Demeanor/Rapport:  Unable to Assess Affect (typically observed):  Unable to Assess Orientation:  Oriented to Self Alcohol / Substance use:  Not Applicable Psych involvement (Current and /or in the community):  No (Comment)  Discharge Needs  Concerns to be addressed:  Care Coordination Readmission within the last 30 days:  Yes Current discharge risk:  Physical Impairment, Dependent with Mobility Barriers to Discharge:  Continued Medical Work up, [place], [place] 09/08/2018, 10:54 AM

## 2018-09-08 NOTE — Progress Notes (Signed)
Physical Therapy Treatment Patient Details Name: Darren Frazier MRN: [id number] DOB: 02-Feb-1941 Today's Date: 09/08/2018    History of Present Illness 78 yo male  PMH including Cardiomyopathy (HCC), DM, Dysrhythmia, AAA, Hyperlipidemia, HTN, Hypothyroidism, Morbid obesity PVD, Stroke (2011), Tremors of nervous system. Presented to [facility] 12/25 with Rt leg numbness and weakness.  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  He was transferred to [facility].  He was intubated for neuro-IR intervention 12/25, self-extubated 12/30. MRI head left MCA/ACA territory scattered small acute infarcts; multiple scattered chronic underlying cortical infarcts involving the bilateral cerebral and right cerebellar hemispheres; moderately advanced cerebral atrophy with chronic small vessel.     PT Comments    Pt received in bed with wife present in room. Pt lethargic requiring cues to keep eyes open and stay on task throughout session. He required +2 total assist bed mobility and max assist to maintain sitting balance EOB. Lift pad placed sitting EOB and maximove utilized for bed to recliner transfer. Pt mouth breathing with short, shallow breaths. Cues provided to breath through nose. Pt positioned in recliner with feet elevated at end of session.    Follow Up Recommendations  SNF     Equipment Recommendations  Wheelchair (measurements PT);Wheelchair cushion (measurements PT)    Recommendations for Other Services       Precautions / Restrictions Precautions Precautions: Fall    Mobility  Bed Mobility Overal bed mobility: Needs Assistance Bed Mobility: Supine to Sit     Supine to sit: Total assist;+2 for physical assistance     General bed mobility comments: assist with RLE off EOB and to elevate trunk. Use of bed pad to scoot to EOB.  Transfers Overall transfer level: Needs assistance               General transfer comment: +2 total bed to recliner transfer using maximove  Ambulation/Gait             General Gait Details: unable    Stairs             Wheelchair Mobility    Modified Rankin (Stroke Patients Only) Modified Rankin (Stroke Patients Only) Pre-Morbid Rankin Score: No significant disability Modified Rankin: Severe disability     Balance Overall balance assessment: Needs assistance Sitting-balance support: Single extremity supported;Feet supported Sitting balance-Leahy Scale: Zero Sitting balance - Comments: max posterior assist to maintain EOB sitting balance Postural control: Posterior lean                                  Cognition Arousal/Alertness: Lethargic;Suspect due to medications Behavior During Therapy: Flat affect Overall Cognitive Status: Impaired/Different from baseline Area of Impairment: Attention;Memory;Following commands;Safety/judgement;Awareness;Problem solving                   Current Attention Level: Focused Memory: Decreased recall of precautions;Decreased short-term memory Following Commands: Follows one step commands inconsistently;Follows one step commands with increased time Safety/Judgement: Decreased awareness of safety;Decreased awareness of deficits Awareness: Intellectual Problem Solving: Slow processing;Decreased initiation;Difficulty sequencing;Requires verbal cues;Requires tactile cues General Comments: cueing required to keep eyes open, cues to stay on task      Exercises      General Comments General comments (skin integrity, edema, etc.): Pt on 3 L O2. Attempted O2 removal. Pt desat to 86% on RA, therefore, O2 via [place] replaced.      Pertinent Vitals/Pain Pain Assessment: Faces Pain Score: 0-No pain  Home Living                      Prior Function            PT Goals (current goals can now be found in the care plan section) Acute Rehab PT Goals Patient Stated Goal: none stated PT Goal Formulation: With patient/family Time For Goal Achievement: 09/17/18 Potential  to Achieve Goals: Fair Progress towards PT goals: Progressing toward goals    Frequency    Min 3X/week      PT Plan Current plan remains appropriate    Co-evaluation PT/OT/SLP Co-Evaluation/Treatment: Yes Reason for Co-Treatment: Complexity of the patient's impairments (multi-system involvement);For patient/therapist safety PT goals addressed during session: Mobility/safety with mobility        AM-PAC PT "6 Clicks" Mobility   Outcome Measure  Help needed turning from your back to your side while in a flat bed without using bedrails?: Total Help needed moving from lying on your back to sitting on the side of a flat bed without using bedrails?: Total Help needed moving to and from a bed to a chair (including a wheelchair)?: Total Help needed standing up from a chair using your arms (e.g., wheelchair or bedside chair)?: Total Help needed to walk in hospital room?: Total Help needed climbing 3-5 steps with a railing? : Total 6 Click Score: 6    End of Session Equipment Utilized During Treatment: Oxygen Activity Tolerance: Patient tolerated treatment well Patient left: in chair;with call bell/phone within reach;with family/visitor present;with chair alarm set Nurse Communication: Mobility status;Need for lift equipment PT Visit Diagnosis: Unsteadiness on feet (R26.81);Other abnormalities of gait and mobility (R26.89);Muscle weakness (generalized) (M62.81);Difficulty in walking, not elsewhere classified (R26.2)     Time: 6153-7943 PT Time Calculation (min) (ACUTE ONLY): 32 min  Charges:  $Therapeutic Activity: 8-22 mins                     Aida Raider, PT  Office # [phone] Pager [phone]    Ilda Foil 09/08/2018, 1:34 PM

## 2018-09-08 NOTE — Progress Notes (Signed)
OT Treatment Note  Pt making slow progress toward OT goals. Pt lethargic this session. Per wife, pt had "something to calm him down". Continue to recommend rehab at [facility]. Will follow acutely.     09/08/18 1400  OT Visit Information  Last OT Received On 09/08/18  Assistance Needed +2  PT/OT/SLP Co-Evaluation/Treatment Yes  Reason for Co-Treatment Complexity of the patient's impairments (multi-system involvement);Necessary to address cognition/behavior during functional activity;To address functional/ADL transfers  OT goals addressed during session ADL's and self-care;Strengthening/ROM  History of Present Illness 78 yo male  PMH including Cardiomyopathy (HCC), DM, Dysrhythmia, AAA, Hyperlipidemia, HTN, Hypothyroidism, Morbid obesity PVD, Stroke (2011), Tremors of nervous system. Presented to [facility] 12/25 with Rt leg numbness and weakness.  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  He was [facility] to  [facility].  He was intubated for neuro-IR intervention 12/25, self-extubated 12/30. MRI head left MCA/ACA territory scattered small acute infarcts; multiple scattered chronic underlying cortical infarcts involving the bilateral cerebral and right cerebellar hemispheres; moderately advanced cerebral atrophy with chronic small vessel.   Precautions  Precautions Fall  Pain Assessment  Pain Assessment Faces  Faces Pain Scale 2  Pain Location general discomfort  Pain Descriptors / Indicators Grimacing  Pain Intervention(s) Limited activity within patient's tolerance  Cognition  Arousal/Alertness Lethargic;Suspect due to medications  Behavior During Therapy Flat affect  Overall Cognitive Status Impaired/Different from baseline  Area of Impairment Attention;Memory;Following commands;Safety/judgement;Awareness;Problem solving  Current Attention Level Focused  Memory Decreased recall of precautions;Decreased short-term memory  Following Commands Follows one step commands inconsistently;Follows one step commands with  increased time  Safety/Judgement Decreased awareness of safety;Decreased awareness of deficits  Awareness Intellectual  Problem Solving Slow processing;Decreased initiation;Difficulty sequencing;Requires verbal cues;Requires tactile cues  General Comments cueing required to keep eyes open, cues to stay on task  Upper Extremity Assessment  Upper Extremity Assessment RUE deficits/detail  RUE Deficits / Details RUE moderate edema; mulitple bandages; able to wiggle fingers when placed on top of head and cued to "scratch head" activation of shoulder shrug during mobility, otherwise no movement observed  RUE Coordination decreased fine motor;decreased gross motor  Lower Extremity Assessment  Lower Extremity Assessment RLE deficits/detail  ADL  Overall ADL's  Needs assistance/impaired  Grooming Moderate assistance;Bed level  Upper Body Bathing Maximal assistance  Functional mobility during ADLs Total assistance;+2 for physical assistance  Bed Mobility  Overal bed mobility Needs Assistance  Bed Mobility Supine to Sit  Rolling Total assist;+2 for physical assistance  Balance  Overall balance assessment Needs assistance  Sitting-balance support Feet supported;Single extremity supported  Sitting balance-Leahy Scale Poor  Sitting balance - Comments posteiror lean but able to sustain at midlien for short periods  - less than 1 min   Vision- Assessment  Vision Assessment? Vision impaired- to be further tested in functional context  Transfers  Transfer via Landscape architect  Exercises  Exercises General Upper Extremity  General Exercises - Upper Extremity  Shoulder Flexion PROM;10 reps;Supine  Shoulder ABduction PROM;Right;10 reps  Elbow Flexion PROM;Right;10 reps  Elbow Extension PROM;Right;10 reps  Wrist Flexion PROM;Right;10 reps  Wrist Extension PROM;Right;10 reps  Digit Composite Flexion PROM;Right;10 reps  Composite Extension PROM;Right;10 reps  OT - End of Session  Equipment  Utilized During Treatment Oxygen (2L)  Activity Tolerance Patient tolerated treatment well  Patient left in chair;with call bell/phone within reach;with chair alarm set;with family/visitor present  Nurse Communication Mobility status;Need for lift equipment [place])  OT Assessment/Plan  OT Plan Discharge plan remains appropriate  OT Visit Diagnosis  Unsteadiness on feet (R26.81);Other abnormalities of gait and mobility (R26.89);Muscle weakness (generalized) (M62.81);Other symptoms and signs involving the nervous system (R29.898);Other symptoms and signs involving cognitive function;Hemiplegia and hemiparesis  Hemiplegia - Right/Left Right  Hemiplegia - dominant/non-dominant Dominant  Hemiplegia - caused by Cerebral infarction  OT Frequency (ACUTE ONLY) Min 2X/week  Follow Up Recommendations SNF;Supervision/Assistance - 24 hour  OT Equipment Other (comment) (TBA at SNF)  AM-PAC OT "6 Clicks" Daily Activity Outcome Measure (Version 2)  Help from another person eating meals? 2  Help from another person taking care of personal grooming? 2  Help from another person toileting, which includes using toliet, bedpan, or urinal? 1  Help from another person bathing (including washing, rinsing, drying)? 2  Help from another person to put on and taking off regular upper body clothing? 2  Help from another person to put on and taking off regular lower body clothing? 1  6 Click Score 10  OT Goal Progression  Progress towards OT goals Not progressing toward goals - comment (lethargic this session)  Acute Rehab OT Goals  Patient Stated Goal per wife for pt to get better  OT Goal Formulation Patient unable to participate in goal setting  ADL Goals  Pt Will Perform Eating with min assist;sitting  Pt Will Perform Grooming with min assist;with caregiver independent in assisting  Additional ADL Goal #1 Pt will perform bed mobility at max A prior to engaging in ADL activity  Additional ADL Goal #2 Pt will  sit EOB as activity tolerance precursor for ADL participation at min A for at least 5 min  OT Time Calculation  OT Start Time (ACUTE ONLY) 1136  OT Stop Time (ACUTE ONLY) 1209  OT Time Calculation (min) 33 min  OT General Charges  $OT Visit 1 Visit  OT Treatments  $Self Care/Home Management  8-22 mins  Luisa Dago, OT/L   Acute OT Clinical Specialist Acute Rehabilitation Services Pager [phone] Office [phone]

## 2018-09-08 NOTE — [place]2 (Addendum)
[place] MEDICAID FL2 LEVEL OF CARE SCREENING TOOL     IDENTIFICATION  Patient Name: Vere Pellett Birthdate: 11-07-1940 Sex: male Admission Date (Current Location): 08/27/2018  [facility] and [place] Number:  Chiropodist and Address:  [facility] [place], [address], [place], [place] [address]      Provider Number: [id number]  Attending Physician Name and Address:  Micki Riley, MD  Relative Name and Phone Number:       Current Level of Care: Hospital Recommended Level of Care: Skilled Nursing Facility Prior Approval Number:    Date Approved/Denied:   PASRR Number: [id number]  Discharge Plan: SNF    Current Diagnoses: Patient Active Problem List   Diagnosis Date Noted  . Acute right-sided weakness 08/27/2018  . Acute ischemic stroke (HCC) 08/27/2018  . 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 Endotracheally intubated   . Essential hypertension 02/20/2018  . Diabetes (HCC) 02/20/2018  . 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 Chronic venous insufficiency 03/31/2017  . Lymphedema 03/31/2017  . Leg pain 03/31/2017    Orientation RESPIRATION BLADDER Height & Weight     Self  O2, Other (Comment)(see DC summary; CPAP at night) Incontinent Weight: (!) 301 lb 13 oz (136.9 kg) Height:  5\' 9"  (175.3 cm)  BEHAVIORAL SYMPTOMS/MOOD NEUROLOGICAL BOWEL NUTRITION STATUS      Incontinent Diet(see DC summary)  AMBULATORY STATUS COMMUNICATION OF NEEDS Skin   Extensive Assist Verbally Skin abrasions, Other (Comment)(bilateral leg cellulitis, right arm blisters with foam dressing)                       Personal Care Assistance Level of Assistance  Bathing, Feeding, Dressing Bathing Assistance: Maximum assistance Feeding assistance: Limited assistance Dressing Assistance: Maximum assistance     Functional Limitations Info  Sight, Hearing, Speech Sight Info:  Impaired Hearing Info: Adequate Speech Info: Impaired    SPECIAL CARE FACTORS FREQUENCY  OT (By licensed OT), PT (By licensed PT)     PT Frequency: 5x/wk OT Frequency: 5x/wk            Contractures Contractures Info: Not present    Additional Factors Info  Code Status, Allergies, Psychotropic, Insulin Sliding Scale Code Status Info: Full Allergies Info: Ace Inhibitors, Omeprazole, Statins Psychotropic Info: Seroquel 12.5mg  every morning; 25 daily at bed Insulin Sliding Scale Info: 0-20 units 3x/day with meals; Levemir 10 units daily       Current Medications (09/08/2018):  This is the current hospital active medication list Current Facility-Administered Medications  Medication Dose Route Frequency Provider Last Rate Last Dose  . 0.9 %  sodium chloride infusion   Intravenous PRN Marvel Plan, MD   Stopped at 09/05/18 1134  . acetaminophen (TYLENOL) tablet 650 mg  650 mg Oral Q4H PRN Milon Dikes, MD   650 mg at 09/07/18 2153   Or  . acetaminophen (TYLENOL) solution 650 mg  650 mg Per Tube Q4H PRN Milon Dikes, MD   650 mg at 09/01/18 0540   Or  . acetaminophen (TYLENOL) suppository 650 mg  650 mg Rectal Q4H PRN Milon Dikes, MD   650 mg at 09/01/18 1734  . amiodarone (PACERONE) tablet 200 mg  200 mg Oral Daily Ollis, Brandi L, NP   200 mg at 09/07/18 0828  . apixaban (ELIQUIS) tablet 5 mg  5 mg Oral BID Baldemar Friday, RPH   5 mg at 09/07/18  2153  . Chlorhexidine Gluconate Cloth 2 % PADS 6 each  6 each Topical Q0600 Marvel Plan, MD   6 each at 09/07/18 0524  . cholecalciferol (VITAMIN D3) tablet 1,000 Units  1,000 Units Per Tube Daily Coralyn Helling, MD   1,000 Units at 09/07/18 0829  . ezetimibe (ZETIA) tablet 10 mg  10 mg Oral Daily Marvel Plan, MD   10 mg at 09/07/18 0829  . feeding supplement (GLUCERNA SHAKE) (GLUCERNA SHAKE) liquid 237 mL  237 mL Oral BID BM Marvel Plan, MD   237 mL at 09/07/18 1507  . haloperidol lactate (HALDOL) injection 1 mg  1 mg Intravenous Q6H  PRN Ranee Gosselin, MD   1 mg at 09/07/18 1707  . insulin aspart (novoLOG) injection 0-20 Units  0-20 Units Subcutaneous TID WC Marvel Plan, MD   3 Units at 09/08/18 0749  . insulin detemir (LEVEMIR) injection 10 Units  10 Units Subcutaneous Daily Coralyn Helling, MD   10 Units at 09/07/18 0830  . levothyroxine (SYNTHROID, LEVOTHROID) tablet 88 mcg  88 mcg Oral Q0600 Canary Brim L, NP   88 mcg at 09/08/18 0616  . MEDLINE mouth rinse  15 mL Mouth Rinse BID Marvel Plan, MD   15 mL at 09/07/18 2200  . metoprolol tartrate (LOPRESSOR) injection 5 mg  5 mg Intravenous Q3H PRN Croitoru, Mihai, MD   5 mg at 09/07/18 1707  . metoprolol tartrate (LOPRESSOR) tablet 50 mg  50 mg Oral BID Marvel Plan, MD   50 mg at 09/07/18 2153  . mupirocin ointment (BACTROBAN) 2 % 1 application  1 application Nasal BID Marvel Plan, MD   1 application at 09/07/18 2159  . ondansetron (ZOFRAN) injection 4 mg  4 mg Intravenous Q6H PRN Deveshwar, Sanjeev, MD      . polyvinyl alcohol (LIQUIFILM TEARS) 1.4 % ophthalmic solution 1 drop  1 drop Both Eyes PRN Marvel Plan, MD   1 drop at 08/31/18 0850  . QUEtiapine (SEROQUEL) tablet 12.5 mg  12.5 mg Oral Daily Marvel Plan, MD   12.5 mg at 09/07/18 1856   And  . QUEtiapine (SEROQUEL) tablet 25 mg  25 mg Oral QHS Marvel Plan, MD   25 mg at 09/07/18 2153  . QUEtiapine (SEROQUEL) tablet 12.5 mg  12.5 mg Oral q morning - 10a Micki Riley, MD      . senna-docusate (Senokot-S) tablet 1 tablet  1 tablet Oral QHS PRN Milon Dikes, MD         Discharge Medications: Please see discharge summary for a list of discharge medications.  Relevant Imaging Results:  Relevant Lab Results:   Additional Information SS#: [id number]  [place], [place]   I have personally obtained history,examined this patient, reviewed notes, independently viewed imaging studies, participated in medical decision making and plan of care.ROS completed by me personally and pertinent positives fully  documented  I have made any additions or clarifications directly to the above note. Agree with note above.    Delia Heady, MD Medical Director [facility] Pager: [phone] 09/08/2018 3:56 PM

## 2018-09-08 NOTE — Progress Notes (Signed)
STROKE TEAM PROGRESS NOTE   SUBJECTIVE (INTERVAL HISTORY) Wife at bedside.  As per RN, patient was awake alert this morning, speaking well, trying to make jokes.  However on the lunchtime, patient was drowsy sleepy and mild shortness of breath after Seroquel given.  Will DC Seroquel morning dose.  Will check CBC, BMP and checks x-ray.  Urine dark, will start IV fluid given relatively poor p.o. intake.  BP on the lower side, decrease metoprolol dose.  OBJECTIVE Temp:  [97.4 F (36.3 C)-97.8 F (36.6 C)] 97.7 F (36.5 C) (01/06 1112) Pulse Rate:  [63-110] 102 (01/06 1112) Cardiac Rhythm: Atrial fibrillation (01/06 0700) Resp:  [20-24] 23 (01/06 1112) BP: (94-147)/(49-100) 105/60 (01/06 1112) SpO2:  [94 %-100 %] 100 % (01/06 1112)  Recent Labs  Lab 09/07/18 1118 09/07/18 1715 09/07/18 2133 09/08/18 0614 09/08/18 1107  GLUCAP 187* 139* 178* 150* 142*   Recent Labs  Lab 09/03/18 0206  09/04/18 0530 09/04/18 2157 09/05/18 0308 09/06/18 0605 09/07/18 0907  NA 146*   < > 145 144 144 141 144  K 2.8*   < > 2.8* 3.3* 3.3* 4.2 5.3*  CL 105   < > 103 101 99 102 109  CO2 26   < > 31 31 34* 30 23  GLUCOSE 87   < > 144* 156* 168* 158* 163*  BUN 19   < > 14 11 12 12 19   CREATININE 1.23   < > 1.07 1.10 1.18 1.15 1.23  CALCIUM 8.6*   < > 8.0* 8.4* 8.5* 8.8* 9.4  MG 1.8  --   --  1.9 1.8  --   --    < > = values in this interval not displayed.   No results for input(s): AST, ALT, ALKPHOS, BILITOT, PROT, ALBUMIN in the last 168 hours. Recent Labs  Lab 09/02/18 0541 09/03/18 0206 09/04/18 0530 09/05/18 0308 09/06/18 0605  WBC 16.6* 19.2* 14.7* 18.6* 16.6*  HGB 9.4* 9.9* 9.3* 10.7* 11.0*  HCT 29.3* 31.3* 30.2* 33.5* 34.0*  MCV 101.7* 102.3* 101.0* 100.6* 102.1*  PLT 322 363 377 500* 488*   No results for input(s): CKTOTAL, CKMB, CKMBINDEX, TROPONINI in the last 168 hours. No results for input(s): LABPROT, INR in the last 72 hours. No results for input(s): COLORURINE, LABSPEC,  PHURINE, GLUCOSEU, HGBUR, BILIRUBINUR, KETONESUR, PROTEINUR, UROBILINOGEN, NITRITE, LEUKOCYTESUR in the last 72 hours.  Invalid input(s): APPERANCEUR     Component Value Date/Time   CHOL 138 08/28/2018 0600   TRIG 171 (H) 08/28/2018 0921   HDL 24 (L) 08/28/2018 0600   CHOLHDL 5.8 08/28/2018 0600   VLDL 33 08/28/2018 0600   LDLCALC 81 08/28/2018 0600   Lab Results  Component Value Date   HGBA1C 7.1 (H) 08/28/2018   No results found for: LABOPIA, COCAINSCRNUR, LABBENZ, AMPHETMU, THCU, LABBARB  No results for input(s): ETH in the last 168 hours.  I have personally reviewed the radiological images below and agree with the radiology interpretations.  Ct Angio Head W Or Wo Contrast  Result Date: 08/27/2018 CLINICAL DATA:  Acute presentation with speech disturbance. EXAM: CT ANGIOGRAPHY HEAD AND NECK TECHNIQUE: Multidetector CT imaging of the head and neck was performed using the standard protocol during bolus administration of intravenous contrast. Multiplanar CT image reconstructions and MIPs were obtained to evaluate the vascular anatomy. Carotid stenosis measurements (when applicable) are obtained utilizing NASCET criteria, using the distal internal carotid diameter as the denominator. CONTRAST:  75mL ISOVUE-370 IOPAMIDOL (ISOVUE-370) INJECTION 76% COMPARISON:  CT same  day. FINDINGS: CTA NECK FINDINGS Aortic arch: Aortic atherosclerosis.  No aneurysm or dissection. Right carotid system: Common carotid artery shows some atherosclerotic plaque but is widely patent to the bifurcation. There is soft and calcified plaque affecting the carotid bifurcation and ICA bulb region. There is severe stenosis in the distal bulb region with luminal diameter of 1 mm. The vessel is quite tortuous beyond that but widely patent to the upper cervical region, where there is soft and calcified plaque resulting in minimal diameter of 3.5 mm. Compared to an expected diameter of 5 mm, stenosis in the bulb is 80% or  greater. Stenosis in the upper cervical region is 30%. Left carotid system: Common carotid artery shows atherosclerotic plaque but is sufficiently patent to the bifurcation region. There is calcified and soft plaque at the carotid bifurcation and ICA bulb. There is left internal carotid artery occlusion at the distal bulb. 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 The left vertebral artery shows 50% stenosis at its origin but is sufficiently patent beyond that through the cervical region to the foramen magnum. Skeleton: Mid cervical spondylosis. Other neck: No mass or lymphadenopathy. Upper chest: Interstitial prominence which could go along with fluid overload or mild edema. Review of the MIP images confirms the above findings CTA HEAD FINDINGS Anterior circulation: Left internal carotid artery is occluded without antegrade flow through the skull base. Right internal carotid artery shows atherosclerotic disease in the carotid siphon region with stenosis estimated at 50-70%. The anterior and middle cerebral vessels are patent. There is atherosclerotic irregularity of the M1 segment with stenosis of 50-70%. No acute vessel occlusion is identified. There is a chronic punctate calcified embolus in 1 of the insular branches, but this was present in 2014. On the left, there is reconstituted flow in the distal siphon. Severe stenosis of the supraclinoid ICA, 80% or greater. Flow is present in the anterior and middle cerebral vessels, secondary to reconstituted flow and flow through communicating arteries. There is 50% stenosis in the M1 segment. I do not see any occluded or missing large or medium vessels in the MCA territory. Posterior circulation: Right vertebral artery shows no antegrade flow at the foramen magnum. There is retrograde flow in the distal right vertebral. Left vertebral artery is patent at the foramen magnum. There is atherosclerotic disease of the V4 segment with stenosis  estimated at 70%. There are serial stenoses. The vessel does show flow to the basilar, which shows atherosclerotic irregularity but is patent. Flow is present in the superior cerebellar and posterior cerebral arteries. Venous sinuses: Patent and normal. Anatomic variants: None significant. Delayed phase: No abnormal enhancement. 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 Severe irregular atherosclerotic disease of the right carotid bifurcation with stenosis of 80% in the bulb region and 30% in the distal ICA. 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 50% stenosis of the left vertebral artery origin. Severe atherosclerotic disease in both carotid siphon regions. Right siphon stenosis estimated at 50-70%. Reconstituted flow in the distal left siphon and supraclinoid region. 80% supraclinoid stenosis. Flow in both anterior and middle cerebral artery territories. Stenosis of both M1 segments estimated at 50-70%. I do not identify any large or medium vessel occlusion acutely within the MCA branch vessels. Severely disease left V4 segment with serial stenoses proximal to the basilar estimated at 70%. Basilar atherosclerotic irregularity without flow limiting stenosis. Posterior circulation branch vessels do  show flow, including right PICA which is supplied by retrograde flow in the distal right vertebral artery. Electronically Signed   By: Paulina FusiMark  Shogry M.D.   On: 08/27/2018 13:14   Dg Chest 2 View  Result Date: 08/27/2018 CLINICAL DATA:  Chest pain and shortness of breath.  Cardiomyopathy. EXAM: CHEST - 2 VIEW COMPARISON:  03/07/2013 FINDINGS: Mild cardiomegaly. Aortic atherosclerosis. Diffuse interstitial infiltrates, consistent with pulmonary edema. Mild subsegmental atelectasis also seen in the left upper lobe. No evidence of pulmonary consolidation or significant pleural effusion. IMPRESSION: Mild cardiomegaly and diffuse  interstitial edema, consistent with congestive heart failure. Electronically Signed   By: Myles RosenthalJohn  Stahl M.D.   On: 08/27/2018 12:02   Ct Angio Neck W Or Wo Contrast  Result Date: 08/27/2018 CLINICAL DATA:  Acute presentation with speech disturbance. EXAM: CT ANGIOGRAPHY HEAD AND NECK TECHNIQUE: Multidetector CT imaging of the head and neck was performed using the standard protocol during bolus administration of intravenous contrast. Multiplanar CT image reconstructions and MIPs were obtained to evaluate the vascular anatomy. Carotid stenosis measurements (when applicable) are obtained utilizing NASCET criteria, using the distal internal carotid diameter as the denominator. CONTRAST:  75mL ISOVUE-370 IOPAMIDOL (ISOVUE-370) INJECTION 76% COMPARISON:  CT same day. FINDINGS: CTA NECK FINDINGS Aortic arch: Aortic atherosclerosis.  No aneurysm or dissection. Right carotid system: Common carotid artery shows some atherosclerotic plaque but is widely patent to the bifurcation. There is soft and calcified plaque affecting the carotid bifurcation and ICA bulb region. There is severe stenosis in the distal bulb region with luminal diameter of 1 mm. The vessel is quite tortuous beyond that but widely patent to the upper cervical region, where there is soft and calcified plaque resulting in minimal diameter of 3.5 mm. Compared to an expected diameter of 5 mm, stenosis in the bulb is 80% or greater. Stenosis in the upper cervical region is 30%. Left carotid system: Common carotid artery shows atherosclerotic plaque but is sufficiently patent to the bifurcation region. There is calcified and soft plaque at the carotid bifurcation and ICA bulb. There is left internal carotid artery occlusion at the distal bulb. 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 The left vertebral artery shows 50% stenosis at its origin but is sufficiently patent beyond that through the cervical region to the foramen  magnum. Skeleton: Mid cervical spondylosis. Other neck: No mass or lymphadenopathy. Upper chest: Interstitial prominence which could go along with fluid overload or mild edema. Review of the MIP images confirms the above findings CTA HEAD FINDINGS Anterior circulation: Left internal carotid artery is occluded without antegrade flow through the skull base. Right internal carotid artery shows atherosclerotic disease in the carotid siphon region with stenosis estimated at 50-70%. The anterior and middle cerebral vessels are patent. There is atherosclerotic irregularity of the M1 segment with stenosis of 50-70%. No acute vessel occlusion is identified. There is a chronic punctate calcified embolus in 1 of the insular branches, but this was present in 2014. On the left, there is reconstituted flow in the distal siphon. Severe stenosis of the supraclinoid ICA, 80% or greater. Flow is present in the anterior and middle cerebral vessels, secondary to reconstituted flow and flow through communicating arteries. There is 50% stenosis in the M1 segment. I do not see any occluded or missing large or medium vessels in the MCA territory. Posterior circulation: Right vertebral artery shows no antegrade flow at the foramen magnum. There is retrograde flow in the distal right vertebral. Left vertebral  artery is patent at the foramen magnum. There is atherosclerotic disease of the V4 segment with stenosis estimated at 70%. There are serial stenoses. The vessel does show flow to the basilar, which shows atherosclerotic irregularity but is patent. Flow is present in the superior cerebellar and posterior cerebral arteries. Venous sinuses: Patent and normal. Anatomic variants: None significant. Delayed phase: No abnormal enhancement. 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 Severe irregular atherosclerotic disease of the right carotid bifurcation with  stenosis of 80% in the bulb region and 30% in the distal ICA. 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 50% stenosis of the left vertebral artery origin. Severe atherosclerotic disease in both carotid siphon regions. Right siphon stenosis estimated at 50-70%. Reconstituted flow in the distal left siphon and supraclinoid region. 80% supraclinoid stenosis. Flow in both anterior and middle cerebral artery territories. Stenosis of both M1 segments estimated at 50-70%. I do not identify any large or medium vessel occlusion acutely within the MCA branch vessels. Severely disease left V4 segment with serial stenoses proximal to the basilar estimated at 70%. Basilar atherosclerotic irregularity without flow limiting stenosis. Posterior circulation branch vessels do show flow, including right PICA which is supplied by retrograde flow in the distal right vertebral artery. Electronically Signed   By: Paulina Fusi M.D.   On: 08/27/2018 13:14   Mr Brain Wo Contrast  Result Date: 08/28/2018 CLINICAL DATA:  Follow-up examination for acute stroke, right lower extremity weakness. Known left ICA occlusion. EXAM: MRI HEAD WITHOUT CONTRAST TECHNIQUE: Multiplanar, multiecho pulse sequences of the brain and surrounding structures were obtained without intravenous contrast. COMPARISON:  Prior CTs from 08/27/2018. FINDINGS: Brain: Generalized age-related cerebral atrophy. Patchy and confluent T2/FLAIR hyperintensity within the periventricular and deep white matter both cerebral hemispheres most consistent with chronic microvascular ischemic disease, moderate nature. Scatter remote cortical infarcts involving the posterior left frontoparietal region, right parietal lobe, and right frontal lobe are seen. Small remote right cerebellar infarct. Remote lacunar infarct present at the left caudate head. Scattered chronic hemosiderin staining seen about several of these infarcts. Scattered multifocal foci of restricted diffusion  involving the cortical subcortical left frontal, parietal, and temporal occipital regions, compatible with acute ischemic left MCA territory infarcts. Largest area of infarction seen at the high left frontal parietal region and measures 2.5 cm (series 3, image 48). Possible faint petechial hemorrhage about a few of these infarcts without frank hemorrhagic transformation or significant mass effect. Findings likely embolic in nature. No mass lesion, midline shift or mass effect. Mild diffuse ventricular prominence related global parenchymal volume loss without hydrocephalus. No extra-axial fluid collection. Pituitary gland within normal limits. Vascular: Abnormal flow void within the left ICA to its cavernous segment, compatible with previously identified left ICA occlusion. Major intracranial vascular flow voids otherwise maintained at the skull base. Skull and upper cervical spine: Craniocervical junction within normal limits. Upper cervical spine normal. Bone marrow signal intensity within normal limits. No scalp soft tissue abnormality. Sinuses/Orbits: Patient status post bilateral ocular lens replacement. Globes and orbital soft tissues demonstrate no acute finding. Scattered mucosal thickening seen throughout the paranasal sinuses with superimposed scattered air-fluid levels. Fluid seen within the nasopharynx. Patient likely intubated. Trace bilateral mastoid effusions noted. Inner ear structures normal. Other: None. IMPRESSION: 1. 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 Possible faint scattered associated petechial hemorrhage without frank hemorrhagic transformation or significant mass effect. 2.  Abnormal flow void within the left ICA to its cavernous segment, consistent with known left ICA occlusion. 3. 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 4. Moderately advanced  cerebral atrophy with chronic small vessel ischemic disease. Electronically Signed   By: Rise Mu M.D.   On: 08/28/2018 15:33   Ct Abdomen Pelvis W Contrast  Result Date: 08/29/2018 CLINICAL DATA:  Abdominal distention.  Bloating. EXAM: CT ABDOMEN AND PELVIS WITH CONTRAST TECHNIQUE: Multidetector CT imaging of the abdomen and pelvis was performed using the standard protocol following bolus administration of intravenous contrast. CONTRAST:  OMNIPAQUE IOHEXOL 300 MG/ML  SOLN COMPARISON:  Abdominal radiograph dated 08/28/2018 and abdominal ultrasound dated 02/28/2016 FINDINGS: Lower chest: There are bilateral consolidative infiltrates in the lower lobes as well as minimal bilateral pleural effusions. Aortic atherosclerosis. Coronary artery calcifications. Hepatobiliary: Liver parenchyma is normal. No biliary ductal dilatation. Vicarious excretion of contrast in the normal appearing gallbladder. Pancreas: Unremarkable. No pancreatic ductal dilatation or surrounding inflammatory changes. Spleen: Normal in size without focal abnormality. Adrenals/Urinary Tract: Adrenal glands are normal. Normal right kidney. Large chronic cyst in the lower pole of the left kidney with some calcification in the wall. Maximum diameter of the cyst is approximately 9 cm. Stomach/Bowel: NG tube tip in the body of the stomach. There are few diverticula in the proximal sigmoid portion of the colon. There is moderate air in the colon without significant distention. Small bowel appears normal. Fluid and air in the stomach. Appendix has been removed. Vascular/Lymphatic: There is a poorly defined hematoma in the right inguinal region in the panniculus. This measures approximately 12 x 5 x 2 cm and is probably related to recent angiography. There are several small lymph nodes in the right inguinal region, the largest being 15 mm in diameter. Extensive aortic atherosclerosis. No adenopathy. Reproductive: Dense calcification in  the normal-sized prostate gland. Other: There is a small amount of ascites in the abdomen, nonspecific. Small periumbilical hernia containing only fat. Edema in the subcutaneous fat of both flanks, right greater than left. Slight edema in the panniculus and in the lateral aspect of both thighs. Musculoskeletal: No acute abnormalities. Multilevel degenerative disc disease in the thoracic spine and lumbar spine. IMPRESSION: 1. Small hematoma in the right inguinal region in the panniculus, probably secondary to the recent angiography. 2. Bilateral consolidative infiltrates in the posterior aspect of both lower lobes with tiny adjacent pleural effusions. 3. Small amount of nonspecific ascites. 4. Slightly prominent gas in the bowel without evidence of bowel obstruction. This probably represents an ileus. Electronically Signed   By: Francene Boyers M.D.   On: 08/29/2018 17:03   Ir Ct Head Ltd  Result Date: 08/31/2018 CLINICAL DATA:  New onset right-sided weakness with mild word-finding difficulties. 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 EXAM: IR ANGIO INTRA EXTRACRAN SEL COM CAROTID INNOMINATE UNI RIGHT MOD SED; IR PERCUTANEOUS ART THORMBECTOMY/INFUSION INTRACRANIAL INCLUDE DIAG ANGIO; IR ANGIO VERTEBRAL SEL SUBCLAVIAN INNOMINATE UNI LEFT MOD SED; IR CT HEAD LIMITED COMPARISON:  CT angiogram of the head and neck of 08/27/2018 MEDICATIONS: Heparin 3,000 units IV; . Ancef 2 g IV antibiotic was administered within 1 hour of the procedure ANESTHESIA/SEDATION: General anesthesia CONTRAST:  Isovue 300 approximately 100 mL FLUOROSCOPY TIME:  Fluoroscopy Time: 60 minutes 0 seconds (4327 mGy). COMPLICATIONS:  None immediate. TECHNIQUE: Informed written consent was obtained from the patient after a thorough discussion of the procedural risks, benefits and  alternatives. All questions were addressed. Maximal Sterile Barrier Technique was utilized including caps, mask, sterile gowns, sterile gloves, sterile drape, hand hygiene and skin antiseptic. A timeout was performed prior to the initiation of the procedure. The right groin was prepped and draped in the usual sterile fashion. Thereafter using modified Seldinger technique, transfemoral access into the right common femoral artery was obtained without difficulty. Over a 0.035 inch guidewire, a 5 French Pinnacle sheath was inserted. Through this, and also over 0.035 inch guidewire, a 5 [place] JB 1 catheter was advanced to the aortic arch region and selectively positioned in the right common carotid artery, the innominate artery , the left common carotid artery and the left vertebral artery. FINDINGS: The left common carotid artery tear g demonstrates a moderate tortuosity of the proximal left common carotid artery The left external carotid artery proximally has a mild to moderate stenosis. Is branches are otherwise normally opacified The left internal carotid artery at the bulb would demonstrates the complete angiographic occlusion with no evidence of a string sign on the delayed arterial images. The innominate artery arteriogram demonstrates patency of the right subclavian artery and the right common carotid artery proximally There is no angiographic evidence of the right vertebral artery The right common carotid bifurcation demonstrates the right external carotid artery and its major branches to be widely patent The right internal carotid artery just distal to the bulb has a severe 90% plus stenosis. Distal to this there is a U-shaped configuration of the right internal carotid artery. More distally the vessel is normal caliber The petrous the cavernous segments and supraclinoid segments are widely patent There is mild atherosclerotic irregularity involving the proximal cavernous, and distal cavernous portions of the  right internal carotid artery. Right posterior communicating artery is seen opacifying the right posterior cerebral artery and the distal basilar artery. The right middle cerebral artery has approximately 30 40% stenosis of the right M1 segment The trifurcation branches appear to be widely patent into the capillary and venous phases The right anterior cerebral artery A1 segment and distally demonstrate wide patency is into the capillary and venous phases There is simultaneous cross-filling via the anterior communicating artery of the left anterior cerebral artery A2 segment and distally. Also demonstrated is opacification of the left anterior cerebral A1 segment, with flow noted into the left middle cerebral artery distribution. Unopacified blood is seen in the left middle cerebral artery from the collateral circulation. 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 More distally the vessel is seen to opacify to the cranial skull base There is severe focal of focal stenosis of the left vertebrobasilar junction just distal to the origin of the left posterior-inferior cerebellar artery Additionally there is a approximately 70% stenosis of the proximal basilar artery More distally the basilar artery demonstrates mild caliber irregularity. There is a opacification of the left posterior cerebral artery the superior cerebellar arteries left greater than right, and the anterior-inferior cerebellar arteries Retrograde opacification of the right vertebrobasilar junction to the level of the right posterior-inferior cerebellar arteries is noted. Caliber irregularity of the right posterior-inferior cerebellar artery suggests intracranial  arteriosclerosis.  Left internal carotid artery occlusion or revascularization attempt The diagnostic JB 1 catheter left common carotid artery was then exchanged over a 0.035 inch 300 cm a 7 French Pinnacle sheath in the right groin. A diagnostic JB 1 catheter were then advanced over the rise exchange guidewire to the descending thoracic aorta. The 7 French Pinnacle sheath was then exchanged over a 0.035 inch 100 cm Amplatz stiff guidewire for a a 8 French 55 cm Brite tip neurovascular sheath using biplane roadmap technique and constant fluoroscopic guidance. Good good aspiration was obtained from the side port of the neurovascular sheath. This was then connected to continuous heparinized saline infusion. The embolus guidewire was then removed. Through the 8 French neurovascular sheath, over a 0.035 inch roadrunner guidewire, a 5 5 [place] JB 1 catheter was advanced to the recover region and select position in the left common carotid artery. Using biplane technique and constant fluoroscopic guidance, over the of the 5 inch roadrunner guidewire, the 5 [place] JB 1 catheter was advanced to the left external carotid artery and exchanged over a 0.035 inch 300 cm rise exchange guidewire for an 8 French 85 cm flow gap balloon guide catheter which had been prepped with 50% contrast and 50% heparinized saline infusion The guidewire was removed. Good aspiration obtained from the hub of the 8 [place] Flo guide catheter. Gentle contrast injection demonstrated no evidence of spasms dissections or of intraluminal filling defects. At this time, in a coaxial manner and with constant heparinized saline infusion, a tree approached 1 microcatheter was advanced over a point 0.014 inch soft tip synchro micro guidewire to the distal end of the Flo guide catheter The Flood guide catheter was advanced to just inside the left internal carotid artery bulb. Thereafter, multiple attempts were made to advance the microcatheter over the micro  guidewire first with Ace synchro soft and then a regular synchro guidewire. In 016 headliner double angled micro guidewire was also utilized in order to access the occluded left internal carotid artery. It was noted that there was a severe tortuosity at the junction of the proximal and the middle 1/3 of the left internal carotid artery Following multiple attempts it was decided to stop. A control was then performed through the [facility] guide catheter in the left common carotid artery which revealed brisk cross-filling of the left internal carotid artery distal cavernous and supraclinoid segments from the left external carotid artery branches via the ipsilateral ophthalmic artery Flow is noted into the left middle cerebral artery as well Also noted was a retrograde opacification from the left posterior communicating artery into the supraclinoid left ICA and then middle cerebral artery An earlier diagnostic catheter arteriogram of the right common carotid artery he had revealed partial cross-filling via the anterior communicating artery from the right internal carotid artery Given the the collateral circulation, it was decided to stop the procedure. Throughout the procedure, the patient's blood pressure and neurological status remained stable No evidence of hemodynamic instability was noted No gross mass-effect or move filling defects or extravasation was seen Prior to the attempted revascularization of the left internal carotid artery occlusion, the patient was given 180 mg of Brilinta, and 81 mg of aspirin orally the an orogastric tube. Also the patient was loaded with 3000 units IV heparin in order to facilitate revascularization. The the 8 [place] flu guide catheter was then retrieved and removed. The 8 [place] Brite tip neurovascular sheath was then exchanged over a J-tip guidewire for a in 8 [place] 35 cm  Brite tip neurovascular sheath. This was then connected to continuous heparinized saline infusion An arteriogram  performed through this 3 5 cm 8 French Brite tip neurovascular sheath in the abdominal aorta reveal excellent flow through the common iliacs, the external and internal iliacs, and also the visualized portions of the common femoral arteries below the level of the inguinal ligaments. At the end of procedure, a CT of the brain revealed no evidence of gross intracranial hemorrhage,, or mass effect or midline shift. The patient was left intubated on account of his wound at the time of his intubation The right groin appeared soft without evidence of a hematoma. Distal pulses in the dorsalis pedis, and posterior regions remained dopplerable bilaterally. The patient was then transferred to the neuro ICU for further post stroke management. IMPRESSION: . 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 Approximately 90% stenosis of the dominant left vertebrobasilar junction distal to the origin of the left posterior-inferior cerebellar artery, and of approximately 70% of the proximal basilar artery. 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 PLAN: Follow-up in the clinic 1 month post discharge Electronically Signed   By: Julieanne Cotton M.D.   On: 08/28/2018 16:08   Ct Cerebral Perfusion W Contrast  Result Date: 08/27/2018 CLINICAL DATA:  Right-sided weakness onset this morning. EXAM: CT PERFUSION BRAIN TECHNIQUE: Multiphase CT imaging of the brain was performed following IV bolus contrast injection. Subsequent parametric perfusion maps were calculated using RAPID software. CONTRAST:  40mL ISOVUE-370 IOPAMIDOL (ISOVUE-370) INJECTION 76% COMPARISON:  CT a head today FINDINGS: CT Brain Perfusion Findings: CBF (<30%) Volume: 0mL Perfusion (Tmax>6.0s) volume: Mismatch Volume: ASPECTS on noncontrast CT Head today, not calculated due to extensive  chronic ischemic change. No definite acute infarct on CT. Infarct Core: 0 mL Infarction Location:Delayed perfusion throughout the left MCA territory with apparent sparing of the basal ganglia. There is occlusion of the left internal carotid artery on CTA, age indeterminate. Left middle cerebral artery is diseased and may have delayed perfusion due to collateral circulation and stenosis. IMPRESSION: 155 mL of delayed perfusion in the left MCA territory. No core infarction. Delayed perfusion left MCA territory may be due to severe intracranial atherosclerotic disease as well as occlusion of the left internal carotid artery. Age of the internal carotid artery occlusion is indeterminate. It is possible this delayed perfusion is a chronic or possibly acute finding. Electronically Signed   By: Marlan Palau M.D.   On: 08/27/2018 16:12   Dg Chest Port 1 View  Result Date: 09/03/2018 CLINICAL DATA:  Shortness of breath. EXAM: PORTABLE CHEST 1 VIEW COMPARISON:  Radiograph September 02, 2018. FINDINGS: Stable cardiomegaly. Atherosclerosis of thoracic aorta is noted. No pneumothorax is noted. Right internal jugular catheter is unchanged in position. Stable bilateral pulmonary edema is noted with minimal pleural effusions. Bony thorax is unremarkable. IMPRESSION: Stable cardiomegaly with bilateral pulmonary edema and minimal pleural effusions. Aortic Atherosclerosis (ICD10-I70.0). Electronically Signed   By: Lupita Raider, M.D.   On: 09/03/2018 12:33   Dg Chest Port 1 View  Result Date: 09/02/2018 CLINICAL DATA:  Acute respiratory distress. EXAM: PORTABLE CHEST 1 VIEW COMPARISON:  One-view chest x-ray 09/01/2018 FINDINGS: The heart is enlarged. Patient has been extubated. NG tube was removed. A right IJ line remains. Atherosclerotic changes are present at the aortic arch. Diffuse interstitial edema is stable. Bibasilar airspace disease is worse on the left.  IMPRESSION: 1. Interval extubation and removal of NG tube. 2.  Stable cardiomegaly and edema consistent with congestive heart failure. 3. Aortic atherosclerosis. Electronically Signed   By: Marin Roberts M.D.   On: 09/02/2018 07:29   Dg Chest Port 1 View  Result Date: 09/01/2018 CLINICAL DATA:  Respiratory failure. EXAM: PORTABLE CHEST 1 VIEW COMPARISON:  08/31/2018 FINDINGS: Endotracheal tube terminates 4.2 cm above the carina. Right jugular catheter terminates over the mid SVC. Enteric tube courses into the left upper abdomen. The cardiac silhouette remains enlarged. Patchy airspace opacities throughout both lungs have not significantly changed. IMPRESSION: Bilateral airspace disease without significant interval change. Electronically Signed   By: Sebastian Ache M.D.   On: 09/01/2018 06:54   Dg Chest Port 1 View  Result Date: 08/31/2018 CLINICAL DATA:  CXR for Respiratory failure. Hx of HTN, stroke, AAA, Diabetes, and Cardiomyopathy. EXAM: PORTABLE CHEST 1 VIEW COMPARISON:  08/30/2018 FINDINGS: Endotracheal tube is in place, tip 3.8 centimeters above the carina. Nasogastric tube is in place, tip beyond the gastroesophageal junction and off the image. A RIGHT IJ central line tip overlies the superior vena cava. Patient is rotated towards the LEFT. The heart is enlarged. There is persistent airspace filling opacity throughout the RIGHT lung, slightly improved. There is significant opacity in the LEFT lung base, consistent with atelectasis or consolidation and increased since 08/27/2018. IMPRESSION: 1. Slight improvement in aeration of the RIGHT lung. 2. Increased opacity in the LEFT lung base. Electronically Signed   By: Norva Pavlov M.D.   On: 08/31/2018 09:52   Dg Chest Port 1 View  Result Date: 08/30/2018 CLINICAL DATA:  Respiratory failure. Cardiomyopathy. EXAM: PORTABLE CHEST 1 VIEW COMPARISON:  08/29/2018 and 08/27/2018 FINDINGS: Endotracheal tube and NG tube and central line appear unchanged and in good position. There has been progression of the  bilateral diffuse pulmonary infiltrates, particularly in the right upper lung zone of the left lung base. Heart size and pulmonary vascularity are normal. No discrete effusions. Aortic atherosclerosis. No acute bone abnormality. IMPRESSION: Progressive bilateral pulmonary infiltrates. Aortic Atherosclerosis (ICD10-I70.0). Electronically Signed   By: Francene Boyers M.D.   On: 08/30/2018 08:48   Dg Chest Port 1 View  Result Date: 08/29/2018 CLINICAL DATA:  Central line placement EXAM: PORTABLE CHEST 1 VIEW COMPARISON:  08/29/2018 FINDINGS: Right central line is been placed with the tip in the SVC. No pneumothorax. Endotracheal tube and NG tube are unchanged. Cardiomegaly with vascular congestion and mild pulmonary edema, stable. IMPRESSION: Right central line tip in the SVC. No pneumothorax. Continued mild CHF. Electronically Signed   By: Charlett Nose M.D.   On: 08/29/2018 11:58   Dg Chest Port 1 View  Result Date: 08/29/2018 CLINICAL DATA:  PT presented to [facility] ED on 08/27/18 with complaints of right lower extremity weakness, dyspnea and chest pressure. Hx of stroke, Peripheral vascualar disease, cardiomyopathy, and A-fib. EXAM: PORTABLE CHEST 1 VIEW COMPARISON:  08/27/2018 FINDINGS: Mild enlargement of the cardiac silhouette, stable. No mediastinal or hilar masses. There is central vascular congestion. Mild hazy opacity noted in the perihilar regions and medial lung bases bilaterally. No convincing pleural effusion and no pneumothorax. Endotracheal tube tip projects 3.8 cm above the Carina. Nasogastric tube passes below the diaphragm well into the stomach. IMPRESSION: 1. Findings are consistent with mild congestive heart failure with evidence of slight improvement when compared to 08/27/2018. No new abnormalities. 2. Support apparatus is well positioned. Electronically Signed   By: Amie Portland M.D.   On: 08/29/2018 07:26  Dg Chest Port 1 View  Result Date: 08/28/2018 CLINICAL DATA:  Shortness of  Breath EXAM: PORTABLE CHEST 1 VIEW COMPARISON:  08/27/2018 FINDINGS: Cardiac shadow is enlarged in size. Aortic calcifications are noted. Endotracheal tube is noted at the level of the carina. This should be withdrawn 2-3 cm. Nasogastric catheter is noted within the stomach. Lungs are well aerated bilaterally with mild interstitial edema similar to that seen on the prior exam. IMPRESSION: Interval intubation with the endotracheal tube at the level of the carina. This should be withdrawn 2-3 cm. Stable edema bilaterally. These results will be called to the ordering clinician or representative by the Radiologist Assistant, and communication documented in the PACS or zVision Dashboard. Electronically Signed   By: Alcide Clever M.D.   On: 08/28/2018 00:19   Dg Abd Portable 1v  Result Date: 08/28/2018 CLINICAL DATA:  Orogastric tube placement. EXAM: PORTABLE ABDOMEN - 1 VIEW COMPARISON:  None. FINDINGS: The patient's enteric tube is seen ending overlying the body of the stomach, with the side port about the fundus of the stomach. The visualized bowel gas pattern is grossly unremarkable. A small left pleural effusion is noted. Increased interstitial markings raise concern for pulmonary edema. Vascular congestion is noted. The patient's endotracheal tube is seen ending 2-3 cm above the carina. No acute osseous abnormalities are identified. IMPRESSION: 1. Enteric tube seen ending overlying the body of the stomach, with the side port about the fundus of the stomach. 2. Small left pleural effusion noted. Increased interstitial markings raise concern for pulmonary edema. Electronically Signed   By: Roanna Raider M.D.   On: 08/28/2018 22:08   Ir Percutaneous Art Thrombectomy/infusion Intracranial Inc Diag Angio  Result Date: 08/31/2018 CLINICAL DATA:  New onset right-sided weakness with mild word-finding difficulties. Abnormal CT angiogram of the head and neck revealing non flow limiting filling defect in the left  middle cerebral artery M1 segment, and also acute occlusion of the left internal carotid artery at the bulb. EXAM: IR ANGIO INTRA EXTRACRAN SEL COM CAROTID INNOMINATE UNI RIGHT MOD SED; IR PERCUTANEOUS ART THORMBECTOMY/INFUSION INTRACRANIAL INCLUDE DIAG ANGIO; IR ANGIO VERTEBRAL SEL SUBCLAVIAN INNOMINATE UNI LEFT MOD SED; IR CT HEAD LIMITED COMPARISON:  CT angiogram of the head and neck of 08/27/2018 MEDICATIONS: Heparin 3,000 units IV; . Ancef 2 g IV antibiotic was administered within 1 hour of the procedure ANESTHESIA/SEDATION: General anesthesia CONTRAST:  Isovue 300 approximately 100 mL FLUOROSCOPY TIME:  Fluoroscopy Time: 60 minutes 0 seconds (4327 mGy). COMPLICATIONS: None immediate. TECHNIQUE: Informed written consent was obtained from the patient after a thorough discussion of the procedural risks, benefits and alternatives. All questions were addressed. Maximal Sterile Barrier Technique was utilized including caps, mask, sterile gowns, sterile gloves, sterile drape, hand hygiene and skin antiseptic. A timeout was performed prior to the initiation of the procedure. The right groin was prepped and draped in the usual sterile fashion. Thereafter using modified Seldinger technique, transfemoral access into the right common femoral artery was obtained without difficulty. Over a 0.035 inch guidewire, a 5 French Pinnacle sheath was inserted. Through this, and also over 0.035 inch guidewire, a 5 [place] JB 1 catheter was advanced to the aortic arch region and selectively positioned in the right common carotid artery, the innominate artery , the left common carotid artery and the left vertebral artery. FINDINGS: The left common carotid artery tear g demonstrates a moderate tortuosity of the proximal left common carotid artery The left external carotid artery proximally has a mild to moderate stenosis.  Is branches are otherwise normally opacified The left internal carotid artery at the bulb would demonstrates the  complete angiographic occlusion with no evidence of a string sign on the delayed arterial images. The innominate artery arteriogram demonstrates patency of the right subclavian artery and the right common carotid artery proximally There is no angiographic evidence of the right vertebral artery The right common carotid bifurcation demonstrates the right external carotid artery and its major branches to be widely patent The right internal carotid artery just distal to the bulb has a severe 90% plus stenosis. Distal to this there is a U-shaped configuration of the right internal carotid artery. More distally the vessel is normal caliber The petrous the cavernous segments and supraclinoid segments are widely patent There is mild atherosclerotic irregularity involving the proximal cavernous, and distal cavernous portions of the right internal carotid artery. Right posterior communicating artery is seen opacifying the right posterior cerebral artery and the distal basilar artery. The right middle cerebral artery has approximately 30 40% stenosis of the right M1 segment The trifurcation branches appear to be widely patent into the capillary and venous phases The right anterior cerebral artery A1 segment and distally demonstrate wide patency is into the capillary and venous phases There is simultaneous cross-filling via the anterior communicating artery of the left anterior cerebral artery A2 segment and distally. Also demonstrated is opacification of the left anterior cerebral A1 segment, with flow noted into the left middle cerebral artery distribution. Unopacified blood is seen in the left middle cerebral artery from the collateral circulation. The left subclavian arteriogram demonstrates mild stenosis at the origin of the right subclavian artery Mild atherosclerotic disease is noted of the subclavian artery and in the region of the thyrocervical trunk The dominant left vertebral artery origin is widely patent The vessel  demonstrates mild tortuosity just distal to this. More distally the vessel is seen to opacify to the cranial skull base There is severe focal of focal stenosis of the left vertebrobasilar junction just distal to the origin of the left posterior-inferior cerebellar artery Additionally there is a approximately 70% stenosis of the proximal basilar artery More distally the basilar artery demonstrates mild caliber irregularity. There is a opacification of the left posterior cerebral artery the superior cerebellar arteries left greater than right, and the anterior-inferior cerebellar arteries Retrograde opacification of the right vertebrobasilar junction to the level of the right posterior-inferior cerebellar arteries is noted. Caliber irregularity of the right posterior-inferior cerebellar artery suggests intracranial arteriosclerosis. Left internal carotid artery occlusion or revascularization attempt The diagnostic JB 1 catheter left common carotid artery was then exchanged over a 0.035 inch 300 cm a 7 French Pinnacle sheath in the right groin. A diagnostic JB 1 catheter were then advanced over the rise exchange guidewire to the descending thoracic aorta. The 7 French Pinnacle sheath was then exchanged over a 0.035 inch 100 cm Amplatz stiff guidewire for a a 8 French 55 cm Brite tip neurovascular sheath using biplane roadmap technique and constant fluoroscopic guidance. Good good aspiration was obtained from the side port of the neurovascular sheath. This was then connected to continuous heparinized saline infusion. The embolus guidewire was then removed. Through the 8 French neurovascular sheath, over a 0.035 inch roadrunner guidewire, a 5 5 [place] JB 1 catheter was advanced to the recover region and select position in the left common carotid artery. Using biplane technique and constant fluoroscopic guidance, over the of the 5 inch roadrunner guidewire, the 5 [place] JB 1 catheter was advanced  to the left external  carotid artery and exchanged over a 0.035 inch 300 cm rise exchange guidewire for an 8 French 85 cm flow gap balloon guide catheter which had been prepped with 50% contrast and 50% heparinized saline infusion The guidewire was removed. Good aspiration obtained from the hub of the 8 [place] Flo guide catheter. Gentle contrast injection demonstrated no evidence of spasms dissections or of intraluminal filling defects. At this time, in a coaxial manner and with constant heparinized saline infusion, a tree approached 1 microcatheter was advanced over a point 0.014 inch soft tip synchro micro guidewire to the distal end of the Flo guide catheter The Flood guide catheter was advanced to just inside the left internal carotid artery bulb. Thereafter, multiple attempts were made to advance the microcatheter over the micro guidewire first with Ace synchro soft and then a regular synchro guidewire. In 016 headliner double angled micro guidewire was also utilized in order to access the occluded left internal carotid artery. It was noted that there was a severe tortuosity at the junction of the proximal and the middle 1/3 of the left internal carotid artery Following multiple attempts it was decided to stop. A control was then performed through the [facility] guide catheter in the left common carotid artery which revealed brisk cross-filling of the left internal carotid artery distal cavernous and supraclinoid segments from the left external carotid artery branches via the ipsilateral ophthalmic artery Flow is noted into the left middle cerebral artery as well Also noted was a retrograde opacification from the left posterior communicating artery into the supraclinoid left ICA and then middle cerebral artery An earlier diagnostic catheter arteriogram of the right common carotid artery he had revealed partial cross-filling via the anterior communicating artery from the right internal carotid artery Given the the collateral circulation, it  was decided to stop the procedure. Throughout the procedure, the patient's blood pressure and neurological status remained stable No evidence of hemodynamic instability was noted No gross mass-effect or move filling defects or extravasation was seen Prior to the attempted revascularization of the left internal carotid artery occlusion, the patient was given 180 mg of Brilinta, and 81 mg of aspirin orally the an orogastric tube. Also the patient was loaded with 3000 units IV heparin in order to facilitate revascularization. The the 8 [place] flu guide catheter was then retrieved and removed. The 8 [place] Brite tip neurovascular sheath was then exchanged over a J-tip guidewire for a in 8 French 35 cm Brite tip neurovascular sheath. This was then connected to continuous heparinized saline infusion An arteriogram performed through this 3 5 cm 8 [place] Brite tip neurovascular sheath in the abdominal aorta reveal excellent flow through the common iliacs, the external and internal iliacs, and also the visualized portions of the common femoral arteries below the level of the inguinal ligaments. At the end of procedure, a CT of the brain revealed no evidence of gross intracranial hemorrhage,, or mass effect or midline shift. The patient was left intubated on account of his wound at the time of his intubation The right groin appeared soft without evidence of a hematoma. Distal pulses in the dorsalis pedis, and posterior regions remained dopplerable bilaterally. The patient was then transferred to the neuro ICU for further post stroke management. IMPRESSION: . 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 Approximately 90% stenosis of the dominant left  vertebrobasilar junction distal to the origin of the left posterior-inferior cerebellar artery, and of approximately 70% of the  proximal basilar artery. 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 PLAN: Follow-up in the clinic 1 month post discharge Electronically Signed   By: Julieanne Cotton M.D.   On: 08/28/2018 16:08   Ct Head Code Stroke Wo Contrast`  Result Date: 08/27/2018 CLINICAL DATA:  Code stroke. Slurred speech beginning 3 weeks ago. Right-sided weakness beginning today. EXAM: CT HEAD WITHOUT CONTRAST TECHNIQUE: Contiguous axial images were obtained from the base of the skull through the vertex without intravenous contrast. COMPARISON:  CT 03/07/2013 FINDINGS: Brain: The brainstem and cerebellum appear normal by CT. Cerebral hemispheres show age related volume loss. There are old cortical and subcortical infarctions in the right frontal lobe, left parietal vertex, left frontal lobe, and left frontoparietal vertex. The show atrophy and encephalomalacia with gliosis. There is no finding of acute or subacute infarction on this CT. There is old lacunar infarction in the left caudate. Vascular: There is atherosclerotic calcification of the major vessels at the base of the brain. No evidence of acute hyperdense vessel. Skull: Negative Sinuses/Orbits: Clear/normal Other: None ASPECTS ([place] Stroke Program Early CT Score) Aspects is difficult in this patient with old infarctions. I do not suspect an acute insult. IMPRESSION: 1. No acute finding by CT. 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 2. 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 No acute finding is suspected by CT. 3. These results were called by telephone at the time of interpretation on 08/27/2018 at 11:47 am to Dr. Daryel November , who verbally acknowledged these results. Electronically Signed   By: Paulina Fusi M.D.   On: 08/27/2018 11:49   Ir Angio Intra Extracran Sel Com Carotid Innominate Uni R Mod Sed  Result Date:  08/31/2018 CLINICAL DATA:  New onset right-sided weakness with mild word-finding difficulties. 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 EXAM: IR ANGIO INTRA EXTRACRAN SEL COM CAROTID INNOMINATE UNI RIGHT MOD SED; IR PERCUTANEOUS ART THORMBECTOMY/INFUSION INTRACRANIAL INCLUDE DIAG ANGIO; IR ANGIO VERTEBRAL SEL SUBCLAVIAN INNOMINATE UNI LEFT MOD SED; IR CT HEAD LIMITED COMPARISON:  CT angiogram of the head and neck of 08/27/2018 MEDICATIONS: Heparin 3,000 units IV; . Ancef 2 g IV antibiotic was administered within 1 hour of the procedure ANESTHESIA/SEDATION: General anesthesia CONTRAST:  Isovue 300 approximately 100 mL FLUOROSCOPY TIME:  Fluoroscopy Time: 60 minutes 0 seconds (4327 mGy). COMPLICATIONS: None immediate. TECHNIQUE: Informed written consent was obtained from the patient after a thorough discussion of the procedural risks, benefits and alternatives. All questions were addressed. Maximal Sterile Barrier Technique was utilized including caps, mask, sterile gowns, sterile gloves, sterile drape, hand hygiene and skin antiseptic. A timeout was performed prior to the initiation of the procedure. The right groin was prepped and draped in the usual sterile fashion. Thereafter using modified Seldinger technique, transfemoral access into the right common femoral artery was obtained without difficulty. Over a 0.035 inch guidewire, a 5 French Pinnacle sheath was inserted. Through this, and also over 0.035 inch guidewire, a 5 [place] JB 1 catheter was advanced to the aortic arch region and selectively positioned in the right common carotid artery, the innominate artery , the left common carotid artery and the left vertebral artery. FINDINGS: The left common  carotid artery tear g demonstrates a moderate tortuosity of the proximal left common carotid artery  The left external carotid artery proximally has a mild to moderate stenosis. Is branches are otherwise normally opacified The left internal carotid artery at the bulb would demonstrates the complete angiographic occlusion with no evidence of a string sign on the delayed arterial images. The innominate artery arteriogram demonstrates patency of the right subclavian artery and the right common carotid artery proximally There is no angiographic evidence of the right vertebral artery The right common carotid bifurcation demonstrates the right external carotid artery and its major branches to be widely patent The right internal carotid artery just distal to the bulb has a severe 90% plus stenosis. Distal to this there is a U-shaped configuration of the right internal carotid artery. More distally the vessel is normal caliber The petrous the cavernous segments and supraclinoid segments are widely patent There is mild atherosclerotic irregularity involving the proximal cavernous, and distal cavernous portions of the right internal carotid artery. Right posterior communicating artery is seen opacifying the right posterior cerebral artery and the distal basilar artery. The right middle cerebral artery has approximately 30 40% stenosis of the right M1 segment The trifurcation branches appear to be widely patent into the capillary and venous phases The right anterior cerebral artery A1 segment and distally demonstrate wide patency is into the capillary and venous phases There is simultaneous cross-filling via the anterior communicating artery of the left anterior cerebral artery A2 segment and distally. Also demonstrated is opacification of the left anterior cerebral A1 segment, with flow noted into the left middle cerebral artery distribution. Unopacified blood is seen in the left middle cerebral artery from the collateral circulation. The left subclavian arteriogram demonstrates mild stenosis at the origin of the right  subclavian artery Mild atherosclerotic disease is noted of the subclavian artery and in the region of the thyrocervical trunk The dominant left vertebral artery origin is widely patent The vessel demonstrates mild tortuosity just distal to this. More distally the vessel is seen to opacify to the cranial skull base There is severe focal of focal stenosis of the left vertebrobasilar junction just distal to the origin of the left posterior-inferior cerebellar artery Additionally there is a approximately 70% stenosis of the proximal basilar artery More distally the basilar artery demonstrates mild caliber irregularity. There is a opacification of the left posterior cerebral artery the superior cerebellar arteries left greater than right, and the anterior-inferior cerebellar arteries Retrograde opacification of the right vertebrobasilar junction to the level of the right posterior-inferior cerebellar arteries is noted. Caliber irregularity of the right posterior-inferior cerebellar artery suggests intracranial arteriosclerosis. Left internal carotid artery occlusion or revascularization attempt The diagnostic JB 1 catheter left common carotid artery was then exchanged over a 0.035 inch 300 cm a 7 French Pinnacle sheath in the right groin. A diagnostic JB 1 catheter were then advanced over the rise exchange guidewire to the descending thoracic aorta. The 7 French Pinnacle sheath was then exchanged over a 0.035 inch 100 cm Amplatz stiff guidewire for a a 8 French 55 cm Brite tip neurovascular sheath using biplane roadmap technique and constant fluoroscopic guidance. Good good aspiration was obtained from the side port of the neurovascular sheath. This was then connected to continuous heparinized saline infusion. The embolus guidewire was then removed. Through the 8 French neurovascular sheath, over a 0.035 inch roadrunner guidewire, a 5 5 [place] JB 1 catheter was advanced to the recover region and select position in the  left common carotid artery. Using biplane technique and constant fluoroscopic guidance, over the of the 5 inch roadrunner guidewire, the 5 [place] JB 1 catheter was advanced to the left external carotid artery and exchanged over a 0.035 inch 300 cm rise exchange guidewire for an 8 French 85 cm flow gap balloon guide catheter which had been prepped with 50% contrast and 50% heparinized saline infusion The guidewire was removed. Good aspiration obtained from the hub of the 8 [place] Flo guide catheter. Gentle contrast injection demonstrated no evidence of spasms dissections or of intraluminal filling defects. At this time, in a coaxial manner and with constant heparinized saline infusion, a tree approached 1 microcatheter was advanced over a point 0.014 inch soft tip synchro micro guidewire to the distal end of the Flo guide catheter The Flood guide catheter was advanced to just inside the left internal carotid artery bulb. Thereafter, multiple attempts were made to advance the microcatheter over the micro guidewire first with Ace synchro soft and then a regular synchro guidewire. In 016 headliner double angled micro guidewire was also utilized in order to access the occluded left internal carotid artery. It was noted that there was a severe tortuosity at the junction of the proximal and the middle 1/3 of the left internal carotid artery Following multiple attempts it was decided to stop. A control was then performed through the [facility] guide catheter in the left common carotid artery which revealed brisk cross-filling of the left internal carotid artery distal cavernous and supraclinoid segments from the left external carotid artery branches via the ipsilateral ophthalmic artery Flow is noted into the left middle cerebral artery as well Also noted was a retrograde opacification from the left posterior communicating artery into the supraclinoid left ICA and then middle cerebral artery An earlier diagnostic catheter  arteriogram of the right common carotid artery he had revealed partial cross-filling via the anterior communicating artery from the right internal carotid artery Given the the collateral circulation, it was decided to stop the procedure. Throughout the procedure, the patient's blood pressure and neurological status remained stable No evidence of hemodynamic instability was noted No gross mass-effect or move filling defects or extravasation was seen Prior to the attempted revascularization of the left internal carotid artery occlusion, the patient was given 180 mg of Brilinta, and 81 mg of aspirin orally the an orogastric tube. Also the patient was loaded with 3000 units IV heparin in order to facilitate revascularization. The the 8 [place] flu guide catheter was then retrieved and removed. The 8 [place] Brite tip neurovascular sheath was then exchanged over a J-tip guidewire for a in 8 French 35 cm Brite tip neurovascular sheath. This was then connected to continuous heparinized saline infusion An arteriogram performed through this 3 5 cm 8 French Brite tip neurovascular sheath in the abdominal aorta reveal excellent flow through the common iliacs, the external and internal iliacs, and also the visualized portions of the common femoral arteries below the level of the inguinal ligaments. At the end of procedure, a CT of the brain revealed no evidence of gross intracranial hemorrhage,, or mass effect or midline shift. The patient was left intubated on account of his wound at the time of his intubation The right groin appeared soft without evidence of a hematoma. Distal pulses in the dorsalis pedis, and posterior regions remained dopplerable bilaterally. The patient was then transferred to the neuro ICU for further post stroke management. IMPRESSION: . Status post attempted endovascular revascularization of occluded left carotid artery  at the bulb. 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 Approximately 90% stenosis of the dominant left vertebrobasilar junction distal to the origin of the left posterior-inferior cerebellar artery, and of approximately 70% of the proximal basilar artery. 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 PLAN: Follow-up in the clinic 1 month post discharge Electronically Signed   By: Julieanne Cotton M.D.   On: 08/28/2018 16:08   Ir Angio Vertebral Sel Subclavian Innominate Uni L Mod Sed  Result Date: 08/31/2018 CLINICAL DATA:  New onset right-sided weakness with mild word-finding difficulties. 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 EXAM: IR ANGIO INTRA EXTRACRAN SEL COM CAROTID INNOMINATE UNI RIGHT MOD SED; IR PERCUTANEOUS ART THORMBECTOMY/INFUSION INTRACRANIAL INCLUDE DIAG ANGIO; IR ANGIO VERTEBRAL SEL SUBCLAVIAN INNOMINATE UNI LEFT MOD SED; IR CT HEAD LIMITED COMPARISON:  CT angiogram of the head and neck of 08/27/2018 MEDICATIONS: Heparin 3,000 units IV; . Ancef 2 g IV antibiotic was administered within 1 hour of the procedure ANESTHESIA/SEDATION: General anesthesia CONTRAST:  Isovue 300 approximately 100 mL FLUOROSCOPY TIME:  Fluoroscopy Time: 60 minutes 0 seconds (4327 mGy). COMPLICATIONS: None immediate. TECHNIQUE: Informed written consent was obtained from the patient after a thorough discussion of the procedural risks, benefits and alternatives. All questions were addressed. Maximal Sterile Barrier Technique was utilized including caps, mask, sterile gowns, sterile gloves, sterile drape, hand hygiene and skin antiseptic. A timeout was performed prior to the initiation of the procedure. The right groin was prepped and draped in the usual sterile fashion. Thereafter using modified Seldinger technique, transfemoral access into the right common femoral artery  was obtained without difficulty. Over a 0.035 inch guidewire, a 5 French Pinnacle sheath was inserted. Through this, and also over 0.035 inch guidewire, a 5 [place] JB 1 catheter was advanced to the aortic arch region and selectively positioned in the right common carotid artery, the innominate artery , the left common carotid artery and the left vertebral artery. FINDINGS: The left common carotid artery tear g demonstrates a moderate tortuosity of the proximal left common carotid artery The left external carotid artery proximally has a mild to moderate stenosis. Is branches are otherwise normally opacified The left internal carotid artery at the bulb would demonstrates the complete angiographic occlusion with no evidence of a string sign on the delayed arterial images. The innominate artery arteriogram demonstrates patency of the right subclavian artery and the right common carotid artery proximally There is no angiographic evidence of the right vertebral artery The right common carotid bifurcation demonstrates the right external carotid artery and its major branches to be widely patent The right internal carotid artery just distal to the bulb has a severe 90% plus stenosis. Distal to this there is a U-shaped configuration of the right internal carotid artery. More distally the vessel is normal caliber The petrous the cavernous segments and supraclinoid segments are widely patent There is mild atherosclerotic irregularity involving the proximal cavernous, and distal cavernous portions of the right internal carotid artery. Right posterior communicating artery is seen opacifying the right posterior cerebral artery and the distal basilar artery. The right middle cerebral artery has approximately 30 40% stenosis of the right M1 segment The trifurcation branches appear to be widely patent into the capillary and venous phases The right anterior cerebral artery A1 segment and distally demonstrate  wide patency is into the  capillary and venous phases There is simultaneous cross-filling via the anterior communicating artery of the left anterior cerebral artery A2 segment and distally. Also demonstrated is opacification of the left anterior cerebral A1 segment, with flow noted into the left middle cerebral artery distribution. Unopacified blood is seen in the left middle cerebral artery from the collateral circulation. 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 More distally the vessel is seen to opacify to the cranial skull base There is severe focal of focal stenosis of the left vertebrobasilar junction just distal to the origin of the left posterior-inferior cerebellar artery Additionally there is a approximately 70% stenosis of the proximal basilar artery More distally the basilar artery demonstrates mild caliber irregularity. There is a opacification of the left posterior cerebral artery the superior cerebellar arteries left greater than right, and the anterior-inferior cerebellar arteries Retrograde opacification of the right vertebrobasilar junction to the level of the right posterior-inferior cerebellar arteries is noted. Caliber irregularity of the right posterior-inferior cerebellar artery suggests intracranial arteriosclerosis. Left internal carotid artery occlusion or revascularization attempt The diagnostic JB 1 catheter left common carotid artery was then exchanged over a 0.035 inch 300 cm a 7 French Pinnacle sheath in the right groin. A diagnostic JB 1 catheter were then advanced over the rise exchange guidewire to the descending thoracic aorta. The 7 French Pinnacle sheath was then exchanged over a 0.035 inch 100 cm Amplatz stiff  guidewire for a a 8 French 55 cm Brite tip neurovascular sheath using biplane roadmap technique and constant fluoroscopic guidance. Good good aspiration was obtained from the side port of the neurovascular sheath. This was then connected to continuous heparinized saline infusion. The embolus guidewire was then removed. Through the 8 French neurovascular sheath, over a 0.035 inch roadrunner guidewire, a 5 5 [place] JB 1 catheter was advanced to the recover region and select position in the left common carotid artery. Using biplane technique and constant fluoroscopic guidance, over the of the 5 inch roadrunner guidewire, the 5 [place] JB 1 catheter was advanced to the left external carotid artery and exchanged over a 0.035 inch 300 cm rise exchange guidewire for an 8 French 85 cm flow gap balloon guide catheter which had been prepped with 50% contrast and 50% heparinized saline infusion The guidewire was removed. Good aspiration obtained from the hub of the 8 [place] Flo guide catheter. Gentle contrast injection demonstrated no evidence of spasms dissections or of intraluminal filling defects. At this time, in a coaxial manner and with constant heparinized saline infusion, a tree approached 1 microcatheter was advanced over a point 0.014 inch soft tip synchro micro guidewire to the distal end of the Flo guide catheter The Flood guide catheter was advanced to just inside the left internal carotid artery bulb. Thereafter, multiple attempts were made to advance the microcatheter over the micro guidewire first with Ace synchro soft and then a regular synchro guidewire. In 016 headliner double angled micro guidewire was also utilized in order to access the occluded left internal carotid artery. It was noted that there was a severe tortuosity at the junction of the proximal and the middle 1/3 of the left internal carotid artery Following multiple attempts it was decided to stop.  A control was then performed through the [facility]  guide catheter in the left common carotid artery which revealed brisk cross-filling of the left internal carotid artery distal cavernous and supraclinoid segments from the left external carotid artery branches via the ipsilateral ophthalmic artery Flow is noted into the left middle cerebral artery as well Also noted was a retrograde opacification from the left posterior communicating artery into the supraclinoid left ICA and then middle cerebral artery An earlier diagnostic catheter arteriogram of the right common carotid artery he had revealed partial cross-filling via the anterior communicating artery from the right internal carotid artery Given the the collateral circulation, it was decided to stop the procedure. Throughout the procedure, the patient's blood pressure and neurological status remained stable No evidence of hemodynamic instability was noted No gross mass-effect or move filling defects or extravasation was seen Prior to the attempted revascularization of the left internal carotid artery occlusion, the patient was given 180 mg of Brilinta, and 81 mg of aspirin orally the an orogastric tube. Also the patient was loaded with 3000 units IV heparin in order to facilitate revascularization. The the 8 [place] flu guide catheter was then retrieved and removed. The 8 [place] Brite tip neurovascular sheath was then exchanged over a J-tip guidewire for a in 8 French 35 cm Brite tip neurovascular sheath. This was then connected to continuous heparinized saline infusion An arteriogram performed through this 3 5 cm 8 French Brite tip neurovascular sheath in the abdominal aorta reveal excellent flow through the common iliacs, the external and internal iliacs, and also the visualized portions of the common femoral arteries below the level of the inguinal ligaments. At the end of procedure, a CT of the brain revealed no evidence of gross intracranial hemorrhage,, or mass effect or midline shift. The patient was left  intubated on account of his wound at the time of his intubation The right groin appeared soft without evidence of a hematoma. Distal pulses in the dorsalis pedis, and posterior regions remained dopplerable bilaterally. The patient was then transferred to the neuro ICU for further post stroke management. IMPRESSION: . 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 Approximately 90% stenosis of the dominant left vertebrobasilar junction distal to the origin of the left posterior-inferior cerebellar artery, and of approximately 70% of the proximal basilar artery. 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 PLAN: Follow-up in the clinic 1 month post discharge Electronically Signed   By: Julieanne Cotton M.D.   On: 08/28/2018 16:08   Vas [place] Lower Extremity Arterial Duplex  Result Date: 09/04/2018 LOWER EXTREMITY ARTERIAL DUPLEX STUDY  Current ABI: Not obtained Limitations: Cerebral angiogram 08/27/18, now with bleeding, pain, and bruising              at groin stick site. Performing Technologist: Gertie Fey MHA, RDMS, RVT, RDCS  Examination Guidelines: A complete evaluation includes B-mode imaging, spectral Doppler, color Doppler, and power Doppler as needed of all accessible portions of each vessel. Bilateral testing is considered an integral part of a complete examination. Limited examinations for reoccurring indications may be performed as noted.  Right Duplex Findings: +----------+--------+-----+--------+----------+--------+           PSV cm/sRatioStenosisWaveform  Comments +----------+--------+-----+--------+----------+--------+ CFA Prox  102                  monophasic         +----------+--------+-----+--------+----------+--------+  CFA Distal90                   monophasic          +----------+--------+-----+--------+----------+--------+ PTA Distal32                   monophasic         +----------+--------+-----+--------+----------+--------+ DP        44                   monophasic         +----------+--------+-----+--------+----------+--------+  Summary: Right: A right common femoral artery pseudoaneurysm was identified measuring 2.8 x 1.5 x 2.5 cm, with a neck measuring 1.3cm long x 0.6cm wide. Pseudoaneurysm measures 2.1cm from the surface of the skin. The proximal right common femoral artery exhibits a monophasic waveform, suggestive of possible aortoiliac obstruction. Distal posterior tibial artery exhibits monophasic flow, and the right dorsalis pedis artery exhibits monophasic flow, suggestive of proximal obstruction.  See table(s) above for measurements and observations.    Preliminary     PHYSICAL EXAM  Temp:  [97.4 F (36.3 C)-97.8 F (36.6 C)] 97.7 F (36.5 C) (01/06 1112) Pulse Rate:  [63-110] 102 (01/06 1112) Resp:  [20-24] 23 (01/06 1112) BP: (94-147)/(49-100) 105/60 (01/06 1112) SpO2:  [94 %-100 %] 100 % (01/06 1112)  General - Well nourished, well developed elderly Caucasian male, drowsy sleepy  Ophthalmologic - fundi not visualized due to noncooperation.  Cardiovascular - irregularly irregular heart rate and rhythm.  Abdomen - right lower abdominal wound stable  Neuro - awake alert,  able to following simple commands. Able to repeat sentences and spontaneous speech but paucity of speech. PERRLA, EOMI, able to track objects on the both sides, inconsistent blinking to visual threat bilaterally.  Facial symmetric.  Tongue midline.  Left upper extremity 4/5.  Right lower extremity and right upper extremity flaccid.  Left lower extremity proximal 2+/5 and distal 3/5.  DTR 1+, Babinski negative.  Sensation symmetrical, coordination slow but intact on the left finger-to-nose and gait not tested.   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  MRI head -left MCA and ACA territory scattered small infarcts  CTA H&N - left ICA, right VA occlusion.  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  2D Echo - EF 60 to 65%  LDL- 81  HgbA1c- 7.1  VTE prophylaxis -SCDs  aspirin 81 mg daily and Pradaxa (dabigatran) twice a dayprior to admission, now on Eliquis 5 mg twice daily  Ongoing aggressive stroke risk factor management  Therapy recommendations: SNF  Disposition: Pending  Right femoral A. Pseudoaneurysm  Left lower abdominal wound still oozing with abdomen wall bruising  Doppler confirmed right femoral artery pseudoaneurysm 2 cm  Vascular  surgery Dr. early on board  No intervention needed at this time  Continue dressing drainage  Pseudoaneurysm need to be addressed in the future with thrombin injection  Aspiration pneumonitis  Fever Tmax 101.2->101.3-> afebrile->101.3-> afebrile  Leukocytosis WBC 22.2-15.5-11.4-13.9->16.6-> 19.2->14.7->18.6  CXR CHF  Off lasix  Sputum culture normal flora  Off Zosyn today  UA negative  Respiratory  distress  Was intubated on ventilation  Self extubated on 09/01/18  Off sedation  So far on nasal cannula, tolerating  CXR pending  Atrial fibrillation, chronic  On Pradaxa PTA, compliant with medication  On metoprolol, rate controlled  On Eliquis and amiodarone po  Rate controlled  BP on the lower side - decrease metoprolol to 25mg  bid  Start gentle hydration - UOP decreased   Hypotension History of hypertension  BP stable   Off neo  On metoprolol - decreased dose to 25mg  bid  Avoid hypotension due to 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 as well as basal artery stenosis  BP goal 120-160  CHF  CXR showed CHF  Fluid overload - off lasix   Anasarca much improved  Cardiology signed off  CXR pending today  Gentle hydration IVF @ 30 given decreased UOP  Hyperlipidemia  Lipid lowering medication ZOX:WRUE  LDL81, goal < 70  Current lipid lowering medication:none (statin intolerant - abnormal labs)  Add zetia   Continue zetia on d/c  Diabetes  HgbA1c7.1, goal < 7.0  Uncontrolled  Hyperglycemia improved  SSI  CBG monitoring  On Levemir, continue  Intermittent agitation  Likely due to aphasia, right hemiparesis and discomfort position with obesity  Better with family in room  Decrease seroquel to 25mg  QHs due to drowsy in the am  Close monitoring  Other Stroke Risk Factors  Advanced age  Former cigarette smoker - quit  Obesity,Body mass index is 47.79 kg/m., recommend weight loss, diet and exercise as appropriate   Hx stroke/TIA  PVD  OSA - continue CPAP at night  Other Active Problems  Urethral 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 This patient's care requiresreview of multiple databases, neurological assessment, discussion with family, other specialists and medical decision making of high complexity.   Marvel Plan, MD PhD Stroke Neurology 09/08/2018 2:05 PM   To contact Stroke Continuity provider, please refer to [email or web address]. After hours, contact General Neurology

## 2018-09-08 NOTE — Progress Notes (Signed)
Pt managed to pull off CPAP mask.  Found on room air, Spo2 88%, HR 129, RR 38.  Pt placed back on CPAP with 3L bleed in.  Spo2 increased 94-95%, RR decreased 20-22, HR decreased to 110.  Pt currently has a mitt on hand to discourage his pulling on lines/tubes.  Pt's RN notified.  Will cont to monitor.

## 2018-09-09 ENCOUNTER — Inpatient Hospital Stay (HOSPITAL_COMMUNITY): Payer: Medicare HMO

## 2018-09-09 LAB — GLUCOSE, CAPILLARY
Glucose-Capillary: 138 mg/dL — ABNORMAL HIGH (ref 70–99)
Glucose-Capillary: 156 mg/dL — ABNORMAL HIGH (ref 70–99)
Glucose-Capillary: 142 mg/dL — ABNORMAL HIGH (ref 70–99)
Glucose-Capillary: 115 mg/dL — ABNORMAL HIGH (ref 70–99)

## 2018-09-09 LAB — CBC
HCT: 36.4 % — ABNORMAL LOW (ref 39.0–52.0)
Hemoglobin: 11.6 g/dL — ABNORMAL LOW (ref 13.0–17.0)
MCH: 34.2 pg — ABNORMAL HIGH (ref 26.0–34.0)
MCHC: 31.9 g/dL (ref 30.0–36.0)
MCV: 107.4 fL — AB (ref 80.0–100.0)
PLATELETS: 369 10*3/uL (ref 150–400)
RBC: 3.39 MIL/uL — ABNORMAL LOW (ref 4.22–5.81)
RDW: 16.2 % — ABNORMAL HIGH (ref 11.5–15.5)
WBC: 12.2 10*3/uL — ABNORMAL HIGH (ref 4.0–10.5)
nRBC: 0.7 % — ABNORMAL HIGH (ref 0.0–0.2)

## 2018-09-09 LAB — BASIC METABOLIC PANEL
Anion gap: 11 (ref 5–15)
BUN: 17 mg/dL (ref 8–23)
CO2: 26 mmol/L (ref 22–32)
Calcium: 9.2 mg/dL (ref 8.9–10.3)
Chloride: 105 mmol/L (ref 98–111)
Creatinine, Ser: 1.29 mg/dL — ABNORMAL HIGH (ref 0.61–1.24)
GFR calc Af Amer: >60 mL/min (ref >60)
GFR, EST NON AFRICAN AMERICAN: 53 mL/min — AB (ref >60)
Glucose, Bld: 162 mg/dL — ABNORMAL HIGH (ref 70–99)
Potassium: 4.4 mmol/L (ref 3.5–5.1)
Sodium: 142 mmol/L (ref 135–145)

## 2018-09-09 MED ORDER — VITAMIN D 25 MCG (1000 UNIT) PO TABS
1000.0000 [IU] | ORAL_TABLET | Freq: Every day | ORAL | Status: DC
  Administered 2018-09-09 – 2018-09-16 (×8): 1000 [IU] via ORAL
  Filled 2018-09-09 (×8): qty 1

## 2018-09-09 MED ORDER — QUETIAPINE FUMARATE 50 MG PO TABS
50.0000 mg | ORAL_TABLET | Freq: Every day | ORAL | Status: DC
  Administered 2018-09-09 – 2018-09-16 (×8): 50 mg via ORAL
  Filled 2018-09-09 (×8): qty 1

## 2018-09-09 NOTE — Progress Notes (Signed)
CSW met with patient's wife this morning to discuss SNF options. CSW discussed with patient's wife how she had done research and wasn't very happy with the options available, so [facility] would be fine as they are the only one so far that has offered a bed. CSW reached out to [facility] to start authorization; they are out of network with [employer] and will check the patient's out of network benefits.   CSW to follow.  Laveda Abbe, [place] Clinical Social Worker [phone]

## 2018-09-09 NOTE — Progress Notes (Signed)
RT Note: CPAP not placed for HS use due to patient agitated, restless, pulling at lines and removing leads per RN at bedside. Patient also has mittens in use. Patient remains on 2 lpm [place]. Spo2 97%.

## 2018-09-09 NOTE — Progress Notes (Signed)
CSW met with patient's wife at bedside to discuss bed offers. Patient's wife to review options and will touch base with CSW tomorrow.  CSW to follow.  Laveda Abbe, [place] Clinical Social Worker [phone]

## 2018-09-09 NOTE — Progress Notes (Addendum)
STROKE TEAM PROGRESS NOTE   SUBJECTIVE (INTERVAL HISTORY) Wife at bedside.  patient's mental status fluctuates.she prefers skilled nursing facility in [place] area. Remains afebrile and white count and BMPis stable. Chest x-ray shows stable atelectasis.and mild congestive heart failure OBJECTIVE Temp:  [97.7 F (36.5 C)-98.9 F (37.2 C)] 98.2 F (36.8 C) (01/07 1115) Pulse Rate:  [69-120] 88 (01/07 1115) Cardiac Rhythm: Atrial fibrillation;Bundle branch block (01/07 0706) Resp:  [18-25] 25 (01/07 1115) BP: (104-135)/(60-81) 115/60 (01/07 1115) SpO2:  [94 %-99 %] 96 % (01/07 1115) FiO2 (%):  [2 %] 2 % (01/07 0400)  Recent Labs  Lab 09/08/18 1107 09/08/18 1734 09/08/18 2126 09/09/18 0640 09/09/18 1112  GLUCAP 142* 149* 164* 138* 156*   Recent Labs  Lab 09/03/18 0206  09/04/18 2157 09/05/18 0308 09/06/18 0605 09/07/18 0907 09/08/18 1247 09/09/18 0620  NA 146*   < > 144 144 141 144 141 142  K 2.8*   < > 3.3* 3.3* 4.2 5.3* 4.0 4.4  CL 105   < > 101 99 102 109 103 105  CO2 26   < > 31 34* 30 23 27 26   GLUCOSE 87   < > 156* 168* 158* 163* 152* 162*  BUN 19   < > 11 12 12 19 18 17   CREATININE 1.23   < > 1.10 1.18 1.15 1.23 1.33* 1.29*  CALCIUM 8.6*   < > 8.4* 8.5* 8.8* 9.4 8.9 9.2  MG 1.8  --  1.9 1.8  --   --   --   --    < > = values in this interval not displayed.   No results for input(s): AST, ALT, ALKPHOS, BILITOT, PROT, ALBUMIN in the last 168 hours. Recent Labs  Lab 09/04/18 0530 09/05/18 0308 09/06/18 0605 09/08/18 1247 09/09/18 0620  WBC 14.7* 18.6* 16.6* 12.1* 12.2*  HGB 9.3* 10.7* 11.0* 10.0* 11.6*  HCT 30.2* 33.5* 34.0* 31.5* 36.4*  MCV 101.0* 100.6* 102.1* 102.6* 107.4*  PLT 377 500* 488* 495* 369   No results for input(s): CKTOTAL, CKMB, CKMBINDEX, TROPONINI in the last 168 hours. No results for input(s): LABPROT, INR in the last 72 hours. No results for input(s): COLORURINE, LABSPEC, PHURINE, GLUCOSEU, HGBUR, BILIRUBINUR, KETONESUR, PROTEINUR,  UROBILINOGEN, NITRITE, LEUKOCYTESUR in the last 72 hours.  Invalid input(s): APPERANCEUR     Component Value Date/Time   CHOL 138 08/28/2018 0600   TRIG 171 (H) 08/28/2018 0921   HDL 24 (L) 08/28/2018 0600   CHOLHDL 5.8 08/28/2018 0600   VLDL 33 08/28/2018 0600   LDLCALC 81 08/28/2018 0600   Lab Results  Component Value Date   HGBA1C 7.1 (H) 08/28/2018   No results found for: LABOPIA, COCAINSCRNUR, LABBENZ, AMPHETMU, THCU, LABBARB  No results for input(s): ETH in the last 168 hours.  I have personally reviewed the radiological images below and agree with the radiology interpretations.  Ct Angio Head W Or Wo Contrast  Result Date: 08/27/2018 CLINICAL DATA:  Acute presentation with speech disturbance. EXAM: CT ANGIOGRAPHY HEAD AND NECK TECHNIQUE: Multidetector CT imaging of the head and neck was performed using the standard protocol during bolus administration of intravenous contrast. Multiplanar CT image reconstructions and MIPs were obtained to evaluate the vascular anatomy. Carotid stenosis measurements (when applicable) are obtained utilizing NASCET criteria, using the distal internal carotid diameter as the denominator. CONTRAST:  75mL ISOVUE-370 IOPAMIDOL (ISOVUE-370) INJECTION 76% COMPARISON:  CT same day. FINDINGS: CTA NECK FINDINGS Aortic arch: Aortic atherosclerosis.  No aneurysm or dissection. Right carotid  system: Common carotid artery shows some atherosclerotic plaque but is widely patent to the bifurcation. There is soft and calcified plaque affecting the carotid bifurcation and ICA bulb region. There is severe stenosis in the distal bulb region with luminal diameter of 1 mm. The vessel is quite tortuous beyond that but widely patent to the upper cervical region, where there is soft and calcified plaque resulting in minimal diameter of 3.5 mm. Compared to an expected diameter of 5 mm, stenosis in the bulb is 80% or greater. Stenosis in the upper cervical region is 30%. Left  carotid system: Common carotid artery shows atherosclerotic plaque but is sufficiently patent to the bifurcation region. There is calcified and soft plaque at the carotid bifurcation and ICA bulb. There is left internal carotid artery occlusion at the distal bulb. 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 The left vertebral artery shows 50% stenosis at its origin but is sufficiently patent beyond that through the cervical region to the foramen magnum. Skeleton: Mid cervical spondylosis. Other neck: No mass or lymphadenopathy. Upper chest: Interstitial prominence which could go along with fluid overload or mild edema. Review of the MIP images confirms the above findings CTA HEAD FINDINGS Anterior circulation: Left internal carotid artery is occluded without antegrade flow through the skull base. Right internal carotid artery shows atherosclerotic disease in the carotid siphon region with stenosis estimated at 50-70%. The anterior and middle cerebral vessels are patent. There is atherosclerotic irregularity of the M1 segment with stenosis of 50-70%. No acute vessel occlusion is identified. There is a chronic punctate calcified embolus in 1 of the insular branches, but this was present in 2014. On the left, there is reconstituted flow in the distal siphon. Severe stenosis of the supraclinoid ICA, 80% or greater. Flow is present in the anterior and middle cerebral vessels, secondary to reconstituted flow and flow through communicating arteries. There is 50% stenosis in the M1 segment. I do not see any occluded or missing large or medium vessels in the MCA territory. Posterior circulation: Right vertebral artery shows no antegrade flow at the foramen magnum. There is retrograde flow in the distal right vertebral. Left vertebral artery is patent at the foramen magnum. There is atherosclerotic disease of the V4 segment with stenosis estimated at 70%. There are serial stenoses. The vessel  does show flow to the basilar, which shows atherosclerotic irregularity but is patent. Flow is present in the superior cerebellar and posterior cerebral arteries. Venous sinuses: Patent and normal. Anatomic variants: None significant. Delayed phase: No abnormal enhancement. 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 Severe irregular atherosclerotic disease of the right carotid bifurcation with stenosis of 80% in the bulb region and 30% in the distal ICA. 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 50% stenosis of the left vertebral artery origin. Severe atherosclerotic disease in both carotid siphon regions. Right siphon stenosis estimated at 50-70%. Reconstituted flow in the distal left siphon and supraclinoid region. 80% supraclinoid stenosis. Flow in both anterior and middle cerebral artery territories. Stenosis of both M1 segments estimated at 50-70%. I do not identify any large or medium vessel occlusion acutely within the MCA branch vessels. Severely disease left V4 segment with serial stenoses proximal to the basilar estimated at 70%. Basilar atherosclerotic irregularity without flow limiting stenosis. Posterior circulation branch vessels do show flow, including right PICA which is supplied by retrograde flow in the distal right vertebral  artery. Electronically Signed   By: Paulina Fusi M.D.   On: 08/27/2018 13:14   Dg Chest 2 View  Result Date: 08/27/2018 CLINICAL DATA:  Chest pain and shortness of breath.  Cardiomyopathy. EXAM: CHEST - 2 VIEW COMPARISON:  03/07/2013 FINDINGS: Mild cardiomegaly. Aortic atherosclerosis. Diffuse interstitial infiltrates, consistent with pulmonary edema. Mild subsegmental atelectasis also seen in the left upper lobe. No evidence of pulmonary consolidation or significant pleural effusion. IMPRESSION: Mild cardiomegaly and diffuse interstitial edema, consistent with congestive heart  failure. Electronically Signed   By: Myles Rosenthal M.D.   On: 08/27/2018 12:02   Ct Angio Neck W Or Wo Contrast  Result Date: 08/27/2018 CLINICAL DATA:  Acute presentation with speech disturbance. EXAM: CT ANGIOGRAPHY HEAD AND NECK TECHNIQUE: Multidetector CT imaging of the head and neck was performed using the standard protocol during bolus administration of intravenous contrast. Multiplanar CT image reconstructions and MIPs were obtained to evaluate the vascular anatomy. Carotid stenosis measurements (when applicable) are obtained utilizing NASCET criteria, using the distal internal carotid diameter as the denominator. CONTRAST:  75mL ISOVUE-370 IOPAMIDOL (ISOVUE-370) INJECTION 76% COMPARISON:  CT same day. FINDINGS: CTA NECK FINDINGS Aortic arch: Aortic atherosclerosis.  No aneurysm or dissection. Right carotid system: Common carotid artery shows some atherosclerotic plaque but is widely patent to the bifurcation. There is soft and calcified plaque affecting the carotid bifurcation and ICA bulb region. There is severe stenosis in the distal bulb region with luminal diameter of 1 mm. The vessel is quite tortuous beyond that but widely patent to the upper cervical region, where there is soft and calcified plaque resulting in minimal diameter of 3.5 mm. Compared to an expected diameter of 5 mm, stenosis in the bulb is 80% or greater. Stenosis in the upper cervical region is 30%. Left carotid system: Common carotid artery shows atherosclerotic plaque but is sufficiently patent to the bifurcation region. There is calcified and soft plaque at the carotid bifurcation and ICA bulb. There is left internal carotid artery occlusion at the distal bulb. 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 The left vertebral artery shows 50% stenosis at its origin but is sufficiently patent beyond that through the cervical region to the foramen magnum. Skeleton: Mid cervical spondylosis. Other neck: No  mass or lymphadenopathy. Upper chest: Interstitial prominence which could go along with fluid overload or mild edema. Review of the MIP images confirms the above findings CTA HEAD FINDINGS Anterior circulation: Left internal carotid artery is occluded without antegrade flow through the skull base. Right internal carotid artery shows atherosclerotic disease in the carotid siphon region with stenosis estimated at 50-70%. The anterior and middle cerebral vessels are patent. There is atherosclerotic irregularity of the M1 segment with stenosis of 50-70%. No acute vessel occlusion is identified. There is a chronic punctate calcified embolus in 1 of the insular branches, but this was present in 2014. On the left, there is reconstituted flow in the distal siphon. Severe stenosis of the supraclinoid ICA, 80% or greater. Flow is present in the anterior and middle cerebral vessels, secondary to reconstituted flow and flow through communicating arteries. There is 50% stenosis in the M1 segment. I do not see any occluded or missing large or medium vessels in the MCA territory. Posterior circulation: Right vertebral artery shows no antegrade flow at the foramen magnum. There is retrograde flow in the distal right vertebral. Left vertebral artery is patent at the foramen magnum. There is atherosclerotic disease of the V4 segment with  stenosis estimated at 70%. There are serial stenoses. The vessel does show flow to the basilar, which shows atherosclerotic irregularity but is patent. Flow is present in the superior cerebellar and posterior cerebral arteries. Venous sinuses: Patent and normal. Anatomic variants: None significant. Delayed phase: No abnormal enhancement. 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 Severe irregular atherosclerotic disease of the right carotid bifurcation with stenosis of 80% in the bulb region and 30% in the distal ICA.  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 50% stenosis of the left vertebral artery origin. Severe atherosclerotic disease in both carotid siphon regions. Right siphon stenosis estimated at 50-70%. Reconstituted flow in the distal left siphon and supraclinoid region. 80% supraclinoid stenosis. Flow in both anterior and middle cerebral artery territories. Stenosis of both M1 segments estimated at 50-70%. I do not identify any large or medium vessel occlusion acutely within the MCA branch vessels. Severely disease left V4 segment with serial stenoses proximal to the basilar estimated at 70%. Basilar atherosclerotic irregularity without flow limiting stenosis. Posterior circulation branch vessels do show flow, including right PICA which is supplied by retrograde flow in the distal right vertebral artery. Electronically Signed   By: Paulina Fusi M.D.   On: 08/27/2018 13:14   Mr Brain Wo Contrast  Result Date: 08/28/2018 CLINICAL DATA:  Follow-up examination for acute stroke, right lower extremity weakness. Known left ICA occlusion. EXAM: MRI HEAD WITHOUT CONTRAST TECHNIQUE: Multiplanar, multiecho pulse sequences of the brain and surrounding structures were obtained without intravenous contrast. COMPARISON:  Prior CTs from 08/27/2018. FINDINGS: Brain: Generalized age-related cerebral atrophy. Patchy and confluent T2/FLAIR hyperintensity within the periventricular and deep white matter both cerebral hemispheres most consistent with chronic microvascular ischemic disease, moderate nature. Scatter remote cortical infarcts involving the posterior left frontoparietal region, right parietal lobe, and right frontal lobe are seen. Small remote right cerebellar infarct. Remote lacunar infarct present at the left caudate head. Scattered chronic hemosiderin staining seen about several of these infarcts. Scattered multifocal foci of restricted diffusion involving the cortical subcortical left frontal, parietal, and  temporal occipital regions, compatible with acute ischemic left MCA territory infarcts. Largest area of infarction seen at the high left frontal parietal region and measures 2.5 cm (series 3, image 48). Possible faint petechial hemorrhage about a few of these infarcts without frank hemorrhagic transformation or significant mass effect. Findings likely embolic in nature. No mass lesion, midline shift or mass effect. Mild diffuse ventricular prominence related global parenchymal volume loss without hydrocephalus. No extra-axial fluid collection. Pituitary gland within normal limits. Vascular: Abnormal flow void within the left ICA to its cavernous segment, compatible with previously identified left ICA occlusion. Major intracranial vascular flow voids otherwise maintained at the skull base. Skull and upper cervical spine: Craniocervical junction within normal limits. Upper cervical spine normal. Bone marrow signal intensity within normal limits. No scalp soft tissue abnormality. Sinuses/Orbits: Patient status post bilateral ocular lens replacement. Globes and orbital soft tissues demonstrate no acute finding. Scattered mucosal thickening seen throughout the paranasal sinuses with superimposed scattered air-fluid levels. Fluid seen within the nasopharynx. Patient likely intubated. Trace bilateral mastoid effusions noted. Inner ear structures normal. Other: None. IMPRESSION: 1. 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 Possible faint scattered associated petechial hemorrhage without frank hemorrhagic transformation or significant mass effect. 2. Abnormal flow void within the left ICA to its cavernous segment, consistent with known left ICA  occlusion. 3. 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 4. Moderately advanced cerebral atrophy with chronic small vessel ischemic disease.  Electronically Signed   By: Rise Mu M.D.   On: 08/28/2018 15:33   Ct Abdomen Pelvis W Contrast  Result Date: 08/29/2018 CLINICAL DATA:  Abdominal distention.  Bloating. EXAM: CT ABDOMEN AND PELVIS WITH CONTRAST TECHNIQUE: Multidetector CT imaging of the abdomen and pelvis was performed using the standard protocol following bolus administration of intravenous contrast. CONTRAST:  OMNIPAQUE IOHEXOL 300 MG/ML  SOLN COMPARISON:  Abdominal radiograph dated 08/28/2018 and abdominal ultrasound dated 02/28/2016 FINDINGS: Lower chest: There are bilateral consolidative infiltrates in the lower lobes as well as minimal bilateral pleural effusions. Aortic atherosclerosis. Coronary artery calcifications. Hepatobiliary: Liver parenchyma is normal. No biliary ductal dilatation. Vicarious excretion of contrast in the normal appearing gallbladder. Pancreas: Unremarkable. No pancreatic ductal dilatation or surrounding inflammatory changes. Spleen: Normal in size without focal abnormality. Adrenals/Urinary Tract: Adrenal glands are normal. Normal right kidney. Large chronic cyst in the lower pole of the left kidney with some calcification in the wall. Maximum diameter of the cyst is approximately 9 cm. Stomach/Bowel: NG tube tip in the body of the stomach. There are few diverticula in the proximal sigmoid portion of the colon. There is moderate air in the colon without significant distention. Small bowel appears normal. Fluid and air in the stomach. Appendix has been removed. Vascular/Lymphatic: There is a poorly defined hematoma in the right inguinal region in the panniculus. This measures approximately 12 x 5 x 2 cm and is probably related to recent angiography. There are several small lymph nodes in the right inguinal region, the largest being 15 mm in diameter. Extensive aortic atherosclerosis. No adenopathy. Reproductive: Dense calcification in the normal-sized prostate gland. Other: There is a small  amount of ascites in the abdomen, nonspecific. Small periumbilical hernia containing only fat. Edema in the subcutaneous fat of both flanks, right greater than left. Slight edema in the panniculus and in the lateral aspect of both thighs. Musculoskeletal: No acute abnormalities. Multilevel degenerative disc disease in the thoracic spine and lumbar spine. IMPRESSION: 1. Small hematoma in the right inguinal region in the panniculus, probably secondary to the recent angiography. 2. Bilateral consolidative infiltrates in the posterior aspect of both lower lobes with tiny adjacent pleural effusions. 3. Small amount of nonspecific ascites. 4. Slightly prominent gas in the bowel without evidence of bowel obstruction. This probably represents an ileus. Electronically Signed   By: Francene Boyers M.D.   On: 08/29/2018 17:03   Ir Ct Head Ltd  Result Date: 08/31/2018 CLINICAL DATA:  New onset right-sided weakness with mild word-finding difficulties. 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 EXAM: IR ANGIO INTRA EXTRACRAN SEL COM CAROTID INNOMINATE UNI RIGHT MOD SED; IR PERCUTANEOUS ART THORMBECTOMY/INFUSION INTRACRANIAL INCLUDE DIAG ANGIO; IR ANGIO VERTEBRAL SEL SUBCLAVIAN INNOMINATE UNI LEFT MOD SED; IR CT HEAD LIMITED COMPARISON:  CT angiogram of the head and neck of 08/27/2018 MEDICATIONS: Heparin 3,000 units IV; . Ancef 2 g IV antibiotic was administered within 1 hour of the procedure ANESTHESIA/SEDATION: General anesthesia CONTRAST:  Isovue 300 approximately 100 mL FLUOROSCOPY TIME:  Fluoroscopy Time: 60 minutes 0 seconds (4327 mGy). COMPLICATIONS: None immediate. TECHNIQUE: Informed written consent was obtained from the patient after a thorough discussion of  the procedural risks, benefits and alternatives. All questions were addressed. Maximal Sterile  Barrier Technique was utilized including caps, mask, sterile gowns, sterile gloves, sterile drape, hand hygiene and skin antiseptic. A timeout was performed prior to the initiation of the procedure. The right groin was prepped and draped in the usual sterile fashion. Thereafter using modified Seldinger technique, transfemoral access into the right common femoral artery was obtained without difficulty. Over a 0.035 inch guidewire, a 5 French Pinnacle sheath was inserted. Through this, and also over 0.035 inch guidewire, a 5 [place] JB 1 catheter was advanced to the aortic arch region and selectively positioned in the right common carotid artery, the innominate artery , the left common carotid artery and the left vertebral artery. FINDINGS: The left common carotid artery tear g demonstrates a moderate tortuosity of the proximal left common carotid artery The left external carotid artery proximally has a mild to moderate stenosis. Is branches are otherwise normally opacified The left internal carotid artery at the bulb would demonstrates the complete angiographic occlusion with no evidence of a string sign on the delayed arterial images. The innominate artery arteriogram demonstrates patency of the right subclavian artery and the right common carotid artery proximally There is no angiographic evidence of the right vertebral artery The right common carotid bifurcation demonstrates the right external carotid artery and its major branches to be widely patent The right internal carotid artery just distal to the bulb has a severe 90% plus stenosis. Distal to this there is a U-shaped configuration of the right internal carotid artery. More distally the vessel is normal caliber The petrous the cavernous segments and supraclinoid segments are widely patent There is mild atherosclerotic irregularity involving the proximal cavernous, and distal cavernous portions of the right internal carotid artery. Right posterior communicating  artery is seen opacifying the right posterior cerebral artery and the distal basilar artery. The right middle cerebral artery has approximately 30 40% stenosis of the right M1 segment The trifurcation branches appear to be widely patent into the capillary and venous phases The right anterior cerebral artery A1 segment and distally demonstrate wide patency is into the capillary and venous phases There is simultaneous cross-filling via the anterior communicating artery of the left anterior cerebral artery A2 segment and distally. Also demonstrated is opacification of the left anterior cerebral A1 segment, with flow noted into the left middle cerebral artery distribution. Unopacified blood is seen in the left middle cerebral artery from the collateral circulation. 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 More distally the vessel is seen to opacify to the cranial skull base There is severe focal of focal stenosis of the left vertebrobasilar junction just distal to the origin of the left posterior-inferior cerebellar artery Additionally there is a approximately 70% stenosis of the proximal basilar artery More distally the basilar artery demonstrates mild caliber irregularity. There is a opacification of the left posterior cerebral artery the superior cerebellar arteries left greater than right, and the anterior-inferior cerebellar arteries Retrograde opacification of the right vertebrobasilar junction to the level of the right posterior-inferior cerebellar arteries is noted. Caliber irregularity of the right posterior-inferior cerebellar artery suggests intracranial arteriosclerosis. Left internal carotid artery occlusion or  revascularization attempt The diagnostic JB 1 catheter left common carotid  artery was then exchanged over a 0.035 inch 300 cm a 7 French Pinnacle sheath in the right groin. A diagnostic JB 1 catheter were then advanced over the rise exchange guidewire to the descending thoracic aorta. The 7 French Pinnacle sheath was then exchanged over a 0.035 inch 100 cm Amplatz stiff guidewire for a a 8 French 55 cm Brite tip neurovascular sheath using biplane roadmap technique and constant fluoroscopic guidance. Good good aspiration was obtained from the side port of the neurovascular sheath. This was then connected to continuous heparinized saline infusion. The embolus guidewire was then removed. Through the 8 French neurovascular sheath, over a 0.035 inch roadrunner guidewire, a 5 5 [place] JB 1 catheter was advanced to the recover region and select position in the left common carotid artery. Using biplane technique and constant fluoroscopic guidance, over the of the 5 inch roadrunner guidewire, the 5 [place] JB 1 catheter was advanced to the left external carotid artery and exchanged over a 0.035 inch 300 cm rise exchange guidewire for an 8 French 85 cm flow gap balloon guide catheter which had been prepped with 50% contrast and 50% heparinized saline infusion The guidewire was removed. Good aspiration obtained from the hub of the 8 [place] Flo guide catheter. Gentle contrast injection demonstrated no evidence of spasms dissections or of intraluminal filling defects. At this time, in a coaxial manner and with constant heparinized saline infusion, a tree approached 1 microcatheter was advanced over a point 0.014 inch soft tip synchro micro guidewire to the distal end of the Flo guide catheter The Flood guide catheter was advanced to just inside the left internal carotid artery bulb. Thereafter, multiple attempts were made to advance the microcatheter over the micro guidewire first with Ace synchro soft and then a regular synchro  guidewire. In 016 headliner double angled micro guidewire was also utilized in order to access the occluded left internal carotid artery. It was noted that there was a severe tortuosity at the junction of the proximal and the middle 1/3 of the left internal carotid artery Following multiple attempts it was decided to stop. A control was then performed through the [facility] guide catheter in the left common carotid artery which revealed brisk cross-filling of the left internal carotid artery distal cavernous and supraclinoid segments from the left external carotid artery branches via the ipsilateral ophthalmic artery Flow is noted into the left middle cerebral artery as well Also noted was a retrograde opacification from the left posterior communicating artery into the supraclinoid left ICA and then middle cerebral artery An earlier diagnostic catheter arteriogram of the right common carotid artery he had revealed partial cross-filling via the anterior communicating artery from the right internal carotid artery Given the the collateral circulation, it was decided to stop the procedure. Throughout the procedure, the patient's blood pressure and neurological status remained stable No evidence of hemodynamic instability was noted No gross mass-effect or move filling defects or extravasation was seen Prior to the attempted revascularization of the left internal carotid artery occlusion, the patient was given 180 mg of Brilinta, and 81 mg of aspirin orally the an orogastric tube. Also the patient was loaded with 3000 units IV heparin in order to facilitate revascularization. The the 8 [place] flu guide catheter was then retrieved and removed. The 8 [place] Brite tip neurovascular sheath was then exchanged over a J-tip guidewire for a in 8 French 35 cm Brite tip neurovascular sheath. This was then connected to continuous heparinized saline infusion An arteriogram performed  through this 3 5 cm 8 [place] Brite tip neurovascular  sheath in the abdominal aorta reveal excellent flow through the common iliacs, the external and internal iliacs, and also the visualized portions of the common femoral arteries below the level of the inguinal ligaments. At the end of procedure, a CT of the brain revealed no evidence of gross intracranial hemorrhage,, or mass effect or midline shift. The patient was left intubated on account of his wound at the time of his intubation The right groin appeared soft without evidence of a hematoma. Distal pulses in the dorsalis pedis, and posterior regions remained dopplerable bilaterally. The patient was then transferred to the neuro ICU for further post stroke management. IMPRESSION: . 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 Approximately 90% stenosis of the dominant left vertebrobasilar junction distal to the origin of the left posterior-inferior cerebellar artery, and of approximately 70% of the proximal basilar artery. 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 PLAN: Follow-up in the clinic 1 month post discharge Electronically Signed   By: Julieanne Cotton M.D.   On: 08/28/2018 16:08   Ct Cerebral Perfusion W Contrast  Result Date: 08/27/2018 CLINICAL DATA:  Right-sided weakness onset this morning. EXAM: CT PERFUSION BRAIN TECHNIQUE: Multiphase CT imaging of the brain was performed following IV bolus contrast injection. Subsequent parametric perfusion maps were calculated using RAPID software. CONTRAST:  40mL ISOVUE-370 IOPAMIDOL (ISOVUE-370) INJECTION 76% COMPARISON:  CT a head today FINDINGS: CT Brain Perfusion Findings: CBF (<30%) Volume: 0mL Perfusion (Tmax>6.0s) volume: Mismatch Volume: ASPECTS on noncontrast CT Head today, not calculated due to extensive chronic ischemic change. No definite acute infarct on CT. Infarct  Core: 0 mL Infarction Location:Delayed perfusion throughout the left MCA territory with apparent sparing of the basal ganglia. There is occlusion of the left internal carotid artery on CTA, age indeterminate. Left middle cerebral artery is diseased and may have delayed perfusion due to collateral circulation and stenosis. IMPRESSION: 155 mL of delayed perfusion in the left MCA territory. No core infarction. Delayed perfusion left MCA territory may be due to severe intracranial atherosclerotic disease as well as occlusion of the left internal carotid artery. Age of the internal carotid artery occlusion is indeterminate. It is possible this delayed perfusion is a chronic or possibly acute finding. Electronically Signed   By: Marlan Palau M.D.   On: 08/27/2018 16:12   Dg Chest Port 1 View  Result Date: 09/03/2018 CLINICAL DATA:  Shortness of breath. EXAM: PORTABLE CHEST 1 VIEW COMPARISON:  Radiograph September 02, 2018. FINDINGS: Stable cardiomegaly. Atherosclerosis of thoracic aorta is noted. No pneumothorax is noted. Right internal jugular catheter is unchanged in position. Stable bilateral pulmonary edema is noted with minimal pleural effusions. Bony thorax is unremarkable. IMPRESSION: Stable cardiomegaly with bilateral pulmonary edema and minimal pleural effusions. Aortic Atherosclerosis (ICD10-I70.0). Electronically Signed   By: Lupita Raider, M.D.   On: 09/03/2018 12:33   Dg Chest Port 1 View  Result Date: 09/02/2018 CLINICAL DATA:  Acute respiratory distress. EXAM: PORTABLE CHEST 1 VIEW COMPARISON:  One-view chest x-ray 09/01/2018 FINDINGS: The heart is enlarged. Patient has been extubated. NG tube was removed. A right IJ line remains. Atherosclerotic changes are present at the aortic arch. Diffuse interstitial edema is stable. Bibasilar airspace disease is worse on the left. IMPRESSION: 1. Interval extubation and removal of NG tube. 2. Stable cardiomegaly and edema consistent with  congestive heart  failure. 3. Aortic atherosclerosis. Electronically Signed   By: Marin Roberts M.D.   On: 09/02/2018 07:29   Dg Chest Port 1 View  Result Date: 09/01/2018 CLINICAL DATA:  Respiratory failure. EXAM: PORTABLE CHEST 1 VIEW COMPARISON:  08/31/2018 FINDINGS: Endotracheal tube terminates 4.2 cm above the carina. Right jugular catheter terminates over the mid SVC. Enteric tube courses into the left upper abdomen. The cardiac silhouette remains enlarged. Patchy airspace opacities throughout both lungs have not significantly changed. IMPRESSION: Bilateral airspace disease without significant interval change. Electronically Signed   By: Sebastian Ache M.D.   On: 09/01/2018 06:54   Dg Chest Port 1 View  Result Date: 08/31/2018 CLINICAL DATA:  CXR for Respiratory failure. Hx of HTN, stroke, AAA, Diabetes, and Cardiomyopathy. EXAM: PORTABLE CHEST 1 VIEW COMPARISON:  08/30/2018 FINDINGS: Endotracheal tube is in place, tip 3.8 centimeters above the carina. Nasogastric tube is in place, tip beyond the gastroesophageal junction and off the image. A RIGHT IJ central line tip overlies the superior vena cava. Patient is rotated towards the LEFT. The heart is enlarged. There is persistent airspace filling opacity throughout the RIGHT lung, slightly improved. There is significant opacity in the LEFT lung base, consistent with atelectasis or consolidation and increased since 08/27/2018. IMPRESSION: 1. Slight improvement in aeration of the RIGHT lung. 2. Increased opacity in the LEFT lung base. Electronically Signed   By: Norva Pavlov M.D.   On: 08/31/2018 09:52   Dg Chest Port 1 View  Result Date: 08/30/2018 CLINICAL DATA:  Respiratory failure. Cardiomyopathy. EXAM: PORTABLE CHEST 1 VIEW COMPARISON:  08/29/2018 and 08/27/2018 FINDINGS: Endotracheal tube and NG tube and central line appear unchanged and in good position. There has been progression of the bilateral diffuse pulmonary infiltrates, particularly in the  right upper lung zone of the left lung base. Heart size and pulmonary vascularity are normal. No discrete effusions. Aortic atherosclerosis. No acute bone abnormality. IMPRESSION: Progressive bilateral pulmonary infiltrates. Aortic Atherosclerosis (ICD10-I70.0). Electronically Signed   By: Francene Boyers M.D.   On: 08/30/2018 08:48   Dg Chest Port 1 View  Result Date: 08/29/2018 CLINICAL DATA:  Central line placement EXAM: PORTABLE CHEST 1 VIEW COMPARISON:  08/29/2018 FINDINGS: Right central line is been placed with the tip in the SVC. No pneumothorax. Endotracheal tube and NG tube are unchanged. Cardiomegaly with vascular congestion and mild pulmonary edema, stable. IMPRESSION: Right central line tip in the SVC. No pneumothorax. Continued mild CHF. Electronically Signed   By: Charlett Nose M.D.   On: 08/29/2018 11:58   Dg Chest Port 1 View  Result Date: 08/29/2018 CLINICAL DATA:  PT presented [facility] ED on 08/27/18 with complaints of right lower extremity weakness, dyspnea and chest pressure. Hx of stroke, Peripheral vascualar disease, cardiomyopathy, and A-fib. EXAM: PORTABLE CHEST 1 VIEW COMPARISON:  08/27/2018 FINDINGS: Mild enlargement of the cardiac silhouette, stable. No mediastinal or hilar masses. There is central vascular congestion. Mild hazy opacity noted in the perihilar regions and medial lung bases bilaterally. No convincing pleural effusion and no pneumothorax. Endotracheal tube tip projects 3.8 cm above the Carina. Nasogastric tube passes below the diaphragm well into the stomach. IMPRESSION: 1. Findings are consistent with mild congestive heart failure with evidence of slight improvement when compared to 08/27/2018. No new abnormalities. 2. Support apparatus is well positioned. Electronically Signed   By: Amie Portland M.D.   On: 08/29/2018 07:26   Dg Chest Port 1 View  Result Date: 08/28/2018 CLINICAL DATA:  Shortness of  Breath EXAM: PORTABLE CHEST 1 VIEW COMPARISON:  08/27/2018  FINDINGS: Cardiac shadow is enlarged in size. Aortic calcifications are noted. Endotracheal tube is noted at the level of the carina. This should be withdrawn 2-3 cm. Nasogastric catheter is noted within the stomach. Lungs are well aerated bilaterally with mild interstitial edema similar to that seen on the prior exam. IMPRESSION: Interval intubation with the endotracheal tube at the level of the carina. This should be withdrawn 2-3 cm. Stable edema bilaterally. These results will be called to the ordering clinician or representative by the Radiologist Assistant, and communication documented in the PACS or zVision Dashboard. Electronically Signed   By: Alcide CleverMark  Lukens M.D.   On: 08/28/2018 00:19   Dg Abd Portable 1v  Result Date: 08/28/2018 CLINICAL DATA:  Orogastric tube placement. EXAM: PORTABLE ABDOMEN - 1 VIEW COMPARISON:  None. FINDINGS: The patient's enteric tube is seen ending overlying the body of the stomach, with the side port about the fundus of the stomach. The visualized bowel gas pattern is grossly unremarkable. A small left pleural effusion is noted. Increased interstitial markings raise concern for pulmonary edema. Vascular congestion is noted. The patient's endotracheal tube is seen ending 2-3 cm above the carina. No acute osseous abnormalities are identified. IMPRESSION: 1. Enteric tube seen ending overlying the body of the stomach, with the side port about the fundus of the stomach. 2. Small left pleural effusion noted. Increased interstitial markings raise concern for pulmonary edema. Electronically Signed   By: Roanna RaiderJeffery  Chang M.D.   On: 08/28/2018 22:08   Ir Percutaneous Art Thrombectomy/infusion Intracranial Inc Diag Angio  Result Date: 08/31/2018 CLINICAL DATA:  New onset right-sided weakness with mild word-finding difficulties. Abnormal CT angiogram of the head and neck revealing non flow limiting filling defect in the left middle cerebral artery M1 segment, and also acute occlusion of  the left internal carotid artery at the bulb. EXAM: IR ANGIO INTRA EXTRACRAN SEL COM CAROTID INNOMINATE UNI RIGHT MOD SED; IR PERCUTANEOUS ART THORMBECTOMY/INFUSION INTRACRANIAL INCLUDE DIAG ANGIO; IR ANGIO VERTEBRAL SEL SUBCLAVIAN INNOMINATE UNI LEFT MOD SED; IR CT HEAD LIMITED COMPARISON:  CT angiogram of the head and neck of 08/27/2018 MEDICATIONS: Heparin 3,000 units IV; . Ancef 2 g IV antibiotic was administered within 1 hour of the procedure ANESTHESIA/SEDATION: General anesthesia CONTRAST:  Isovue 300 approximately 100 mL FLUOROSCOPY TIME:  Fluoroscopy Time: 60 minutes 0 seconds (4327 mGy). COMPLICATIONS: None immediate. TECHNIQUE: Informed written consent was obtained from the patient after a thorough discussion of the procedural risks, benefits and alternatives. All questions were addressed. Maximal Sterile Barrier Technique was utilized including caps, mask, sterile gowns, sterile gloves, sterile drape, hand hygiene and skin antiseptic. A timeout was performed prior to the initiation of the procedure. The right groin was prepped and draped in the usual sterile fashion. Thereafter using modified Seldinger technique, transfemoral access into the right common femoral artery was obtained without difficulty. Over a 0.035 inch guidewire, a 5 French Pinnacle sheath was inserted. Through this, and also over 0.035 inch guidewire, a 5 [place] JB 1 catheter was advanced to the aortic arch region and selectively positioned in the right common carotid artery, the innominate artery , the left common carotid artery and the left vertebral artery. FINDINGS: The left common carotid artery tear g demonstrates a moderate tortuosity of the proximal left common carotid artery The left external carotid artery proximally has a mild to moderate stenosis. Is branches are otherwise normally opacified The left internal carotid artery at the bulb  would demonstrates the complete angiographic occlusion with no evidence of a string sign  on the delayed arterial images. The innominate artery arteriogram demonstrates patency of the right subclavian artery and the right common carotid artery proximally There is no angiographic evidence of the right vertebral artery The right common carotid bifurcation demonstrates the right external carotid artery and its major branches to be widely patent The right internal carotid artery just distal to the bulb has a severe 90% plus stenosis. Distal to this there is a U-shaped configuration of the right internal carotid artery. More distally the vessel is normal caliber The petrous the cavernous segments and supraclinoid segments are widely patent There is mild atherosclerotic irregularity involving the proximal cavernous, and distal cavernous portions of the right internal carotid artery. Right posterior communicating artery is seen opacifying the right posterior cerebral artery and the distal basilar artery. The right middle cerebral artery has approximately 30 40% stenosis of the right M1 segment The trifurcation branches appear to be widely patent into the capillary and venous phases The right anterior cerebral artery A1 segment and distally demonstrate wide patency is into the capillary and venous phases There is simultaneous cross-filling via the anterior communicating artery of the left anterior cerebral artery A2 segment and distally. Also demonstrated is opacification of the left anterior cerebral A1 segment, with flow noted into the left middle cerebral artery distribution. Unopacified blood is seen in the left middle cerebral artery from the collateral circulation. 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 More distally  the vessel is seen to opacify to the cranial skull base There is severe focal of focal stenosis of the left vertebrobasilar junction just distal to the origin of the left posterior-inferior cerebellar artery Additionally there is a approximately 70% stenosis of the proximal basilar artery More distally the basilar artery demonstrates mild caliber irregularity. There is a opacification of the left posterior cerebral artery the superior cerebellar arteries left greater than right, and the anterior-inferior cerebellar arteries Retrograde opacification of the right vertebrobasilar junction to the level of the right posterior-inferior cerebellar arteries is noted. Caliber irregularity of the right posterior-inferior cerebellar artery suggests intracranial arteriosclerosis. Left internal carotid artery occlusion or revascularization attempt The diagnostic JB 1 catheter left common carotid artery was then exchanged over a 0.035 inch 300 cm a 7 French Pinnacle sheath in the right groin. A diagnostic JB 1 catheter were then advanced over the rise exchange guidewire to the descending thoracic aorta. The 7 French Pinnacle sheath was then exchanged over a 0.035 inch 100 cm Amplatz stiff guidewire for a a 8 French 55 cm Brite tip neurovascular sheath using biplane roadmap technique and constant fluoroscopic guidance. Good good aspiration was obtained from the side port of the neurovascular sheath. This was then connected to continuous heparinized saline infusion. The embolus guidewire was then removed. Through the 8 French neurovascular sheath, over a 0.035 inch roadrunner guidewire, a 5 5 [place] JB 1 catheter was advanced to the recover region and select position in the left common carotid artery. Using biplane technique and constant fluoroscopic guidance, over the of the 5 inch roadrunner guidewire, the 5 [place] JB 1 catheter was advanced to the left external carotid artery and exchanged over a 0.035 inch 300 cm  rise exchange  guidewire for an 8 French 85 cm flow gap balloon guide catheter which had been prepped with 50% contrast and 50% heparinized saline infusion The guidewire was removed. Good aspiration obtained from the hub of the 8 [place] Flo guide catheter. Gentle contrast injection demonstrated no evidence of spasms dissections or of intraluminal filling defects. At this time, in a coaxial manner and with constant heparinized saline infusion, a tree approached 1 microcatheter was advanced over a point 0.014 inch soft tip synchro micro guidewire to the distal end of the Flo guide catheter The Flood guide catheter was advanced to just inside the left internal carotid artery bulb. Thereafter, multiple attempts were made to advance the microcatheter over the micro guidewire first with Ace synchro soft and then a regular synchro guidewire. In 016 headliner double angled micro guidewire was also utilized in order to access the occluded left internal carotid artery. It was noted that there was a severe tortuosity at the junction of the proximal and the middle 1/3 of the left internal carotid artery Following multiple attempts it was decided to stop. A control was then performed through the [facility] guide catheter in the left common carotid artery which revealed brisk cross-filling of the left internal carotid artery distal cavernous and supraclinoid segments from the left external carotid artery branches via the ipsilateral ophthalmic artery Flow is noted into the left middle cerebral artery as well Also noted was a retrograde opacification from the left posterior communicating artery into the supraclinoid left ICA and then middle cerebral artery An earlier diagnostic catheter arteriogram of the right common carotid artery he had revealed partial cross-filling via the anterior communicating artery from the right internal carotid artery Given the the collateral circulation, it was decided to stop the procedure. Throughout the procedure, the  patient's blood pressure and neurological status remained stable No evidence of hemodynamic instability was noted No gross mass-effect or move filling defects or extravasation was seen Prior to the attempted revascularization of the left internal carotid artery occlusion, the patient was given 180 mg of Brilinta, and 81 mg of aspirin orally the an orogastric tube. Also the patient was loaded with 3000 units IV heparin in order to facilitate revascularization. The the 8 [place] flu guide catheter was then retrieved and removed. The 8 [place] Brite tip neurovascular sheath was then exchanged over a J-tip guidewire for a in 8 French 35 cm Brite tip neurovascular sheath. This was then connected to continuous heparinized saline infusion An arteriogram performed through this 3 5 cm 8 [place] Brite tip neurovascular sheath in the abdominal aorta reveal excellent flow through the common iliacs, the external and internal iliacs, and also the visualized portions of the common femoral arteries below the level of the inguinal ligaments. At the end of procedure, a CT of the brain revealed no evidence of gross intracranial hemorrhage,, or mass effect or midline shift. The patient was left intubated on account of his wound at the time of his intubation The right groin appeared soft without evidence of a hematoma. Distal pulses in the dorsalis pedis, and posterior regions remained dopplerable bilaterally. The patient was then transferred to the neuro ICU for further post stroke management. IMPRESSION: . 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 Approximately 90% stenosis of the dominant left vertebrobasilar junction distal to the origin of the left posterior-inferior cerebellar artery, and of  approximately 70% of the proximal basilar artery. Scattered mild-to-moderate intracranial  arteriosclerosis. PLAN: Follow-up in the clinic 1 month post discharge Electronically Signed   By: Julieanne Cotton M.D.   On: 08/28/2018 16:08   Ct Head Code Stroke Wo Contrast`  Result Date: 08/27/2018 CLINICAL DATA:  Code stroke. Slurred speech beginning 3 weeks ago. Right-sided weakness beginning today. EXAM: CT HEAD WITHOUT CONTRAST TECHNIQUE: Contiguous axial images were obtained from the base of the skull through the vertex without intravenous contrast. COMPARISON:  CT 03/07/2013 FINDINGS: Brain: The brainstem and cerebellum appear normal by CT. Cerebral hemispheres show age related volume loss. There are old cortical and subcortical infarctions in the right frontal lobe, left parietal vertex, left frontal lobe, and left frontoparietal vertex. The show atrophy and encephalomalacia with gliosis. There is no finding of acute or subacute infarction on this CT. There is old lacunar infarction in the left caudate. Vascular: There is atherosclerotic calcification of the major vessels at the base of the brain. No evidence of acute hyperdense vessel. Skull: Negative Sinuses/Orbits: Clear/normal Other: None ASPECTS ([place] Stroke Program Early CT Score) Aspects is difficult in this patient with old infarctions. I do not suspect an acute insult. IMPRESSION: 1. No acute finding by CT. 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 2. 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 No acute finding is suspected by CT. 3. These results were called by telephone at the time of interpretation on 08/27/2018 at 11:47 am to Dr. Daryel November , who verbally acknowledged these results. Electronically Signed   By: Paulina Fusi M.D.   On: 08/27/2018 11:49   Ir Angio Intra Extracran Sel Com Carotid Innominate Uni R Mod Sed  Result Date: 08/31/2018 CLINICAL DATA:  New onset right-sided weakness with mild  word-finding difficulties. 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 EXAM: IR ANGIO INTRA EXTRACRAN SEL COM CAROTID INNOMINATE UNI RIGHT MOD SED; IR PERCUTANEOUS ART THORMBECTOMY/INFUSION INTRACRANIAL INCLUDE DIAG ANGIO; IR ANGIO VERTEBRAL SEL SUBCLAVIAN INNOMINATE UNI LEFT MOD SED; IR CT HEAD LIMITED COMPARISON:  CT angiogram of the head and neck of 08/27/2018 MEDICATIONS: Heparin 3,000 units IV; . Ancef 2 g IV antibiotic was administered within 1 hour of the procedure ANESTHESIA/SEDATION: General anesthesia CONTRAST:  Isovue 300 approximately 100 mL FLUOROSCOPY TIME:  Fluoroscopy Time: 60 minutes 0 seconds (4327 mGy). COMPLICATIONS: None immediate. TECHNIQUE: Informed written consent was obtained from the patient after a thorough discussion of the procedural risks, benefits and alternatives. All questions were addressed. Maximal Sterile Barrier Technique was utilized including caps, mask, sterile gowns, sterile gloves, sterile drape, hand hygiene and skin antiseptic. A timeout was performed prior to the initiation of the procedure. The right groin was prepped and draped in the usual sterile fashion. Thereafter using modified Seldinger technique, transfemoral access into the right common femoral artery was obtained without difficulty. Over a 0.035 inch guidewire, a 5 French Pinnacle sheath was inserted. Through this, and also over 0.035 inch guidewire, a 5 [place] JB 1 catheter was advanced to the aortic arch region and selectively positioned in the right common carotid artery, the innominate artery , the left common carotid artery and the left vertebral artery. FINDINGS: The left common carotid artery tear g demonstrates a moderate tortuosity of the proximal left common carotid  artery The left external carotid artery proximally has a mild to moderate  stenosis. Is branches are otherwise normally opacified The left internal carotid artery at the bulb would demonstrates the complete angiographic occlusion with no evidence of a string sign on the delayed arterial images. The innominate artery arteriogram demonstrates patency of the right subclavian artery and the right common carotid artery proximally There is no angiographic evidence of the right vertebral artery The right common carotid bifurcation demonstrates the right external carotid artery and its major branches to be widely patent The right internal carotid artery just distal to the bulb has a severe 90% plus stenosis. Distal to this there is a U-shaped configuration of the right internal carotid artery. More distally the vessel is normal caliber The petrous the cavernous segments and supraclinoid segments are widely patent There is mild atherosclerotic irregularity involving the proximal cavernous, and distal cavernous portions of the right internal carotid artery. Right posterior communicating artery is seen opacifying the right posterior cerebral artery and the distal basilar artery. The right middle cerebral artery has approximately 30 40% stenosis of the right M1 segment The trifurcation branches appear to be widely patent into the capillary and venous phases The right anterior cerebral artery A1 segment and distally demonstrate wide patency is into the capillary and venous phases There is simultaneous cross-filling via the anterior communicating artery of the left anterior cerebral artery A2 segment and distally. Also demonstrated is opacification of the left anterior cerebral A1 segment, with flow noted into the left middle cerebral artery distribution. Unopacified blood is seen in the left middle cerebral artery from the collateral circulation. The left subclavian arteriogram demonstrates mild stenosis at the origin of the right subclavian artery Mild atherosclerotic disease is noted of the  subclavian artery and in the region of the thyrocervical trunk The dominant left vertebral artery origin is widely patent The vessel demonstrates mild tortuosity just distal to this. More distally the vessel is seen to opacify to the cranial skull base There is severe focal of focal stenosis of the left vertebrobasilar junction just distal to the origin of the left posterior-inferior cerebellar artery Additionally there is a approximately 70% stenosis of the proximal basilar artery More distally the basilar artery demonstrates mild caliber irregularity. There is a opacification of the left posterior cerebral artery the superior cerebellar arteries left greater than right, and the anterior-inferior cerebellar arteries Retrograde opacification of the right vertebrobasilar junction to the level of the right posterior-inferior cerebellar arteries is noted. Caliber irregularity of the right posterior-inferior cerebellar artery suggests intracranial arteriosclerosis. Left internal carotid artery occlusion or revascularization attempt The diagnostic JB 1 catheter left common carotid artery was then exchanged over a 0.035 inch 300 cm a 7 French Pinnacle sheath in the right groin. A diagnostic JB 1 catheter were then advanced over the rise exchange guidewire to the descending thoracic aorta. The 7 French Pinnacle sheath was then exchanged over a 0.035 inch 100 cm Amplatz stiff guidewire for a a 8 French 55 cm Brite tip neurovascular sheath using biplane roadmap technique and constant fluoroscopic guidance. Good good aspiration was obtained from the side port of the neurovascular sheath. This was then connected to continuous heparinized saline infusion. The embolus guidewire was then removed. Through the 8 French neurovascular sheath, over a 0.035 inch roadrunner guidewire, a 5 5 [place] JB 1 catheter was advanced to the recover region and select position in the left common carotid artery. Using biplane technique and constant  fluoroscopic guidance, over the  of the 5 inch roadrunner guidewire, the 5 [place] JB 1 catheter was advanced to the left external carotid artery and exchanged over a 0.035 inch 300 cm rise exchange guidewire for an 8 French 85 cm flow gap balloon guide catheter which had been prepped with 50% contrast and 50% heparinized saline infusion The guidewire was removed. Good aspiration obtained from the hub of the 8 [place] Flo guide catheter. Gentle contrast injection demonstrated no evidence of spasms dissections or of intraluminal filling defects. At this time, in a coaxial manner and with constant heparinized saline infusion, a tree approached 1 microcatheter was advanced over a point 0.014 inch soft tip synchro micro guidewire to the distal end of the Flo guide catheter The Flood guide catheter was advanced to just inside the left internal carotid artery bulb. Thereafter, multiple attempts were made to advance the microcatheter over the micro guidewire first with Ace synchro soft and then a regular synchro guidewire. In 016 headliner double angled micro guidewire was also utilized in order to access the occluded left internal carotid artery. It was noted that there was a severe tortuosity at the junction of the proximal and the middle 1/3 of the left internal carotid artery Following multiple attempts it was decided to stop. A control was then performed through the [facility] guide catheter in the left common carotid artery which revealed brisk cross-filling of the left internal carotid artery distal cavernous and supraclinoid segments from the left external carotid artery branches via the ipsilateral ophthalmic artery Flow is noted into the left middle cerebral artery as well Also noted was a retrograde opacification from the left posterior communicating artery into the supraclinoid left ICA and then middle cerebral artery An earlier diagnostic catheter arteriogram of the right common carotid artery he had revealed partial  cross-filling via the anterior communicating artery from the right internal carotid artery Given the the collateral circulation, it was decided to stop the procedure. Throughout the procedure, the patient's blood pressure and neurological status remained stable No evidence of hemodynamic instability was noted No gross mass-effect or move filling defects or extravasation was seen Prior to the attempted revascularization of the left internal carotid artery occlusion, the patient was given 180 mg of Brilinta, and 81 mg of aspirin orally the an orogastric tube. Also the patient was loaded with 3000 units IV heparin in order to facilitate revascularization. The the 8 [place] flu guide catheter was then retrieved and removed. The 8 [place] Brite tip neurovascular sheath was then exchanged over a J-tip guidewire for a in 8 French 35 cm Brite tip neurovascular sheath. This was then connected to continuous heparinized saline infusion An arteriogram performed through this 3 5 cm 8 French Brite tip neurovascular sheath in the abdominal aorta reveal excellent flow through the common iliacs, the external and internal iliacs, and also the visualized portions of the common femoral arteries below the level of the inguinal ligaments. At the end of procedure, a CT of the brain revealed no evidence of gross intracranial hemorrhage,, or mass effect or midline shift. The patient was left intubated on account of his wound at the time of his intubation The right groin appeared soft without evidence of a hematoma. Distal pulses in the dorsalis pedis, and posterior regions remained dopplerable bilaterally. The patient was then transferred to the neuro ICU for further post stroke management. IMPRESSION: . 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 Approximately 90% stenosis of the dominant  left vertebrobasilar junction distal to the origin of the left posterior-inferior cerebellar artery, and of approximately 70% of the proximal basilar artery. 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 PLAN: Follow-up in the clinic 1 month post discharge Electronically Signed   By: Julieanne Cotton M.D.   On: 08/28/2018 16:08   Ir Angio Vertebral Sel Subclavian Innominate Uni L Mod Sed  Result Date: 08/31/2018 CLINICAL DATA:  New onset right-sided weakness with mild word-finding difficulties. 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 EXAM: IR ANGIO INTRA EXTRACRAN SEL COM CAROTID INNOMINATE UNI RIGHT MOD SED; IR PERCUTANEOUS ART THORMBECTOMY/INFUSION INTRACRANIAL INCLUDE DIAG ANGIO; IR ANGIO VERTEBRAL SEL SUBCLAVIAN INNOMINATE UNI LEFT MOD SED; IR CT HEAD LIMITED COMPARISON:  CT angiogram of the head and neck of 08/27/2018 MEDICATIONS: Heparin 3,000 units IV; . Ancef 2 g IV antibiotic was administered within 1 hour of the procedure ANESTHESIA/SEDATION: General anesthesia CONTRAST:  Isovue 300 approximately 100 mL FLUOROSCOPY TIME:  Fluoroscopy Time: 60 minutes 0 seconds (4327 mGy). COMPLICATIONS: None immediate. TECHNIQUE: Informed written consent was obtained from the patient after a thorough discussion of the procedural risks, benefits and alternatives. All questions were addressed. Maximal Sterile Barrier Technique was utilized including caps, mask, sterile gowns, sterile gloves, sterile drape, hand hygiene and skin antiseptic. A timeout was performed prior to the initiation of the procedure. The right groin was prepped and draped in the usual sterile fashion. Thereafter using modified Seldinger technique, transfemoral access into the right common femoral artery was obtained without difficulty. Over a 0.035 inch guidewire, a 5  French Pinnacle sheath was inserted. Through this, and also over 0.035 inch guidewire, a 5 [place] JB 1 catheter was advanced to the aortic arch region and selectively positioned in the right common carotid artery, the innominate artery , the left common carotid artery and the left vertebral artery. FINDINGS: The left common carotid artery tear g demonstrates a moderate tortuosity of the proximal left common carotid artery The left external carotid artery proximally has a mild to moderate stenosis. Is branches are otherwise normally opacified The left internal carotid artery at the bulb would demonstrates the complete angiographic occlusion with no evidence of a string sign on the delayed arterial images. The innominate artery arteriogram demonstrates patency of the right subclavian artery and the right common carotid artery proximally There is no angiographic evidence of the right vertebral artery The right common carotid bifurcation demonstrates the right external carotid artery and its major branches to be widely patent The right internal carotid artery just distal to the bulb has a severe 90% plus stenosis. Distal to this there is a U-shaped configuration of the right internal carotid artery. More distally the vessel is normal caliber The petrous the cavernous segments and supraclinoid segments are widely patent There is mild atherosclerotic irregularity involving the proximal cavernous, and distal cavernous portions of the right internal carotid artery. Right posterior communicating artery is seen opacifying the right posterior cerebral artery and the distal basilar artery. The right middle cerebral artery has approximately 30 40% stenosis of the right M1 segment The trifurcation branches appear to be widely patent into the capillary and venous phases The right anterior cerebral artery A1 segment and distally demonstrate wide patency is into the capillary and venous phases There is simultaneous cross-filling  via  the anterior communicating artery of the left anterior cerebral artery A2 segment and distally. Also demonstrated is opacification of the left anterior cerebral A1 segment, with flow noted into the left middle cerebral artery distribution. Unopacified blood is seen in the left middle cerebral artery from the collateral circulation. 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 More distally the vessel is seen to opacify to the cranial skull base There is severe focal of focal stenosis of the left vertebrobasilar junction just distal to the origin of the left posterior-inferior cerebellar artery Additionally there is a approximately 70% stenosis of the proximal basilar artery More distally the basilar artery demonstrates mild caliber irregularity. There is a opacification of the left posterior cerebral artery the superior cerebellar arteries left greater than right, and the anterior-inferior cerebellar arteries Retrograde opacification of the right vertebrobasilar junction to the level of the right posterior-inferior cerebellar arteries is noted. Caliber irregularity of the right posterior-inferior cerebellar artery suggests intracranial arteriosclerosis. Left internal carotid artery occlusion or revascularization attempt The diagnostic JB 1 catheter left common carotid artery was then exchanged over a 0.035 inch 300 cm a 7 French Pinnacle sheath in the right groin. A diagnostic JB 1 catheter were then advanced over the rise exchange guidewire to the descending thoracic aorta. The 7 French Pinnacle sheath was then exchanged over a 0.035 inch 100 cm Amplatz stiff guidewire for a a 8 French 55 cm Brite tip neurovascular sheath using  biplane roadmap technique and constant fluoroscopic guidance. Good good aspiration was obtained from the side port of the neurovascular sheath. This was then connected to continuous heparinized saline infusion. The embolus guidewire was then removed. Through the 8 French neurovascular sheath, over a 0.035 inch roadrunner guidewire, a 5 5 [place] JB 1 catheter was advanced to the recover region and select position in the left common carotid artery. Using biplane technique and constant fluoroscopic guidance, over the of the 5 inch roadrunner guidewire, the 5 [place] JB 1 catheter was advanced to the left external carotid artery and exchanged over a 0.035 inch 300 cm rise exchange guidewire for an 8 French 85 cm flow gap balloon guide catheter which had been prepped with 50% contrast and 50% heparinized saline infusion The guidewire was removed. Good aspiration obtained from the hub of the 8 [place] Flo guide catheter. Gentle contrast injection demonstrated no evidence of spasms dissections or of intraluminal filling defects. At this time, in a coaxial manner and with constant heparinized saline infusion, a tree approached 1 microcatheter was advanced over a point 0.014 inch soft tip synchro micro guidewire to the distal end of the Flo guide catheter The Flood guide catheter was advanced to just inside the left internal carotid artery bulb. Thereafter, multiple attempts were made to advance the microcatheter over the micro guidewire first with Ace synchro soft and then a regular synchro guidewire. In 016 headliner double angled micro guidewire was also utilized in order to access the occluded left internal carotid artery. It was noted that there was a severe tortuosity at the junction of the proximal and the middle 1/3 of the left internal carotid artery Following multiple attempts it was decided to stop. A control was then performed through the [facility] guide catheter in the left  common carotid artery which revealed brisk  cross-filling of the left internal carotid artery distal cavernous and supraclinoid segments from the left external carotid artery branches via the ipsilateral ophthalmic artery Flow is noted into the left middle cerebral artery as well Also noted was a retrograde opacification from the left posterior communicating artery into the supraclinoid left ICA and then middle cerebral artery An earlier diagnostic catheter arteriogram of the right common carotid artery he had revealed partial cross-filling via the anterior communicating artery from the right internal carotid artery Given the the collateral circulation, it was decided to stop the procedure. Throughout the procedure, the patient's blood pressure and neurological status remained stable No evidence of hemodynamic instability was noted No gross mass-effect or move filling defects or extravasation was seen Prior to the attempted revascularization of the left internal carotid artery occlusion, the patient was given 180 mg of Brilinta, and 81 mg of aspirin orally the an orogastric tube. Also the patient was loaded with 3000 units IV heparin in order to facilitate revascularization. The the 8 [place] flu guide catheter was then retrieved and removed. The 8 [place] Brite tip neurovascular sheath was then exchanged over a J-tip guidewire for a in 8 French 35 cm Brite tip neurovascular sheath. This was then connected to continuous heparinized saline infusion An arteriogram performed through this 3 5 cm 8 French Brite tip neurovascular sheath in the abdominal aorta reveal excellent flow through the common iliacs, the external and internal iliacs, and also the visualized portions of the common femoral arteries below the level of the inguinal ligaments. At the end of procedure, a CT of the brain revealed no evidence of gross intracranial hemorrhage,, or mass effect or midline shift. The patient was left intubated on account of his wound at the time of his intubation The  right groin appeared soft without evidence of a hematoma. Distal pulses in the dorsalis pedis, and posterior regions remained dopplerable bilaterally. The patient was then transferred to the neuro ICU for further post stroke management. IMPRESSION: . 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 Approximately 90% stenosis of the dominant left vertebrobasilar junction distal to the origin of the left posterior-inferior cerebellar artery, and of approximately 70% of the proximal basilar artery. 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 PLAN: Follow-up in the clinic 1 month post discharge Electronically Signed   By: Julieanne Cotton M.D.   On: 08/28/2018 16:08   Vas [place] Lower Extremity Arterial Duplex  Result Date: 09/04/2018 LOWER EXTREMITY ARTERIAL DUPLEX STUDY  Current ABI: Not obtained Limitations: Cerebral angiogram 08/27/18, now with bleeding, pain, and bruising              at groin stick site. Performing Technologist: Gertie Fey MHA, RDMS, RVT, RDCS  Examination Guidelines: A complete evaluation includes B-mode imaging, spectral Doppler, color Doppler, and power Doppler as needed of all accessible portions of each vessel. Bilateral testing is considered an integral part of a complete examination. Limited examinations for reoccurring indications may be performed as noted.  Right Duplex Findings: +----------+--------+-----+--------+----------+--------+           PSV cm/sRatioStenosisWaveform  Comments +----------+--------+-----+--------+----------+--------+ CFA Prox  102                  monophasic         +----------+--------+-----+--------+----------+--------+ CFA Distal90  monophasic         +----------+--------+-----+--------+----------+--------+ PTA Distal32                   monophasic          +----------+--------+-----+--------+----------+--------+ DP        44                   monophasic         +----------+--------+-----+--------+----------+--------+  Summary: Right: A right common femoral artery pseudoaneurysm was identified measuring 2.8 x 1.5 x 2.5 cm, with a neck measuring 1.3cm long x 0.6cm wide. Pseudoaneurysm measures 2.1cm from the surface of the skin. The proximal right common femoral artery exhibits a monophasic waveform, suggestive of possible aortoiliac obstruction. Distal posterior tibial artery exhibits monophasic flow, and the right dorsalis pedis artery exhibits monophasic flow, suggestive of proximal obstruction.  See table(s) above for measurements and observations.    Preliminary     PHYSICAL EXAM  Temp:  [97.7 F (36.5 C)-98.9 F (37.2 C)] 98.2 F (36.8 C) (01/07 1115) Pulse Rate:  [69-120] 88 (01/07 1115) Resp:  [18-25] 25 (01/07 1115) BP: (104-135)/(60-81) 115/60 (01/07 1115) SpO2:  [94 %-99 %] 96 % (01/07 1115) FiO2 (%):  [2 %] 2 % (01/07 0400)  General - Well nourished, well developed elderly Caucasian male, drowsy sleepy  Ophthalmologic - fundi not visualized due to noncooperation.  Cardiovascular - irregularly irregular heart rate and rhythm.  Abdomen - right lower abdominal wound stable  Neuro - drowsy but arousablet,  able to following simple commands. Able to repeat sentences and spontaneous speech but paucity of speech. PERRLA, EOMI, able to track objects on the both sides, inconsistent blinking to visual threat bilaterally.  Facial symmetric.  Tongue midline.  Left upper extremity 4/5.  Right lower extremity and right upper extremity flaccid.  Left lower extremity proximal 2+/5 and distal 3/5.  DTR 1+, Babinski negative.  Sensation symmetrical, coordination slow but intact on the left finger-to-nose and gait not tested.   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  MRI head -left MCA and ACA territory scattered small infarcts  CTA H&N - left ICA, right VA occlusion.  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  2D Echo - EF 60 to 65%  LDL- 81  HgbA1c- 7.1  VTE prophylaxis -SCDs  aspirin 81 mg daily and Pradaxa (dabigatran) twice a dayprior to admission, now on Eliquis 5 mg twice daily  Ongoing aggressive stroke risk factor management  Therapy recommendations: SNF  Disposition: Pending  Right femoral A. 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 early on board  No intervention needed at  this time  Continue dressing drainage  Pseudoaneurysm need to be addressed in the future with thrombin injection  Aspiration pneumonitis  Fever Tmax 101.2->101.3-> afebrile->101.3-> afebrile  Leukocytosis WBC 22.2-15.5-11.4-13.9->16.6-> 19.2->14.7->18.6  CXR CHF  Off lasix  Sputum culture normal flora  Off Zosyn today  UA negative  Respiratory distress  Was intubated on ventilation  Self extubated on  09/01/18  Off sedation  So far on nasal cannula, tolerating  CXR pending  Atrial fibrillation, chronic  On Pradaxa PTA, compliant with medication  On metoprolol, rate controlled  On Eliquis and amiodarone po  Rate controlled  BP on the lower side - decrease metoprolol to 25mg  bid  Start gentle hydration - UOP decreased   Hypotension History of hypertension  BP stable   Off neo  On metoprolol - decreased dose to 25mg  bid  Avoid hypotension due to 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 as well as basal artery stenosis  BP goal 120-160  CHF  CXR showed CHF  Fluid overload - off lasix   Anasarca much improved  Cardiology signed off  CXR pending today  Gentle hydration IVF @ 30 given decreased UOP  Hyperlipidemia  Lipid lowering medication ZOX:WRUE  LDL81, goal < 70  Current lipid lowering medication:none (statin intolerant - abnormal labs)  Add zetia   Continue zetia on d/c  Diabetes  HgbA1c7.1, goal < 7.0  Uncontrolled  Hyperglycemia improved  SSI  CBG monitoring  On Levemir, continue  Intermittent agitation  Likely due to aphasia, right hemiparesis and discomfort position with obesity  Better with family in room  Decrease seroquel to 25mg  QHs due to drowsy in the am  Close monitoring  Other Stroke Risk Factors  Advanced age  Former cigarette smoker - quit  Obesity,Body mass index is 47.79 kg/m., recommend weight loss, diet and exercise as appropriate   Hx stroke/TIA  PVD  OSA - continue CPAP at night  Other Active Problems  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  Continue present treatment. Hope to transfer to rehabilitation and medically stable over the next few days I spent  25 minutes in total face-to-face time with the patient,  more than 50% of which was spent in counseling and coordination of care, reviewing test results, images and medication, and discussing the diagnosis of drowsy sleepy, low BP, SOB, decreased UOP, treatment plan and potential prognosis. This patient's care requiresreview of multiple databases, neurological assessment, discussion with family, other specialists and medical decision making of high complexity.  Delia Heady, MD Stroke Neurology 09/09/2018 3:16 PM   To contact Stroke Continuity provider, please refer to [email or web address]. After hours, contact General Neurology

## 2018-09-09 NOTE — Progress Notes (Signed)
Patient ID: Darren Frazier, male   DOB: 10/22/1940, 78 y.o.   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  Right groin bruising resolving.  No further drainage from old hematoma.  I do not palpate expansile mass or false aneurysm but the patient is morbidly obese with a large pannus.  We will continue to follow.  At some point will need repeat duplex to determine if he is spontaneously resolved his false aneurysm.

## 2018-09-09 NOTE — Progress Notes (Signed)
Placed patient on CPAP via FFM, 10.0 cm H20 with 3 lpm O2 bleed in. RN aware.

## 2018-09-10 ENCOUNTER — Inpatient Hospital Stay (HOSPITAL_COMMUNITY): Payer: Medicare HMO

## 2018-09-10 LAB — BASIC METABOLIC PANEL
ANION GAP: 11 (ref 5–15)
BUN: 14 mg/dL (ref 8–23)
CO2: 24 mmol/L (ref 22–32)
Calcium: 9 mg/dL (ref 8.9–10.3)
Chloride: 106 mmol/L (ref 98–111)
Creatinine, Ser: 1.25 mg/dL — ABNORMAL HIGH (ref 0.61–1.24)
GFR calc Af Amer: >60 mL/min (ref >60)
GFR calc non Af Amer: 55 mL/min — ABNORMAL LOW (ref >60)
Glucose, Bld: 150 mg/dL — ABNORMAL HIGH (ref 70–99)
Potassium: 4.2 mmol/L (ref 3.5–5.1)
Sodium: 141 mmol/L (ref 135–145)

## 2018-09-10 LAB — CBC
HCT: 33.4 % — ABNORMAL LOW (ref 39.0–52.0)
Hemoglobin: 10.4 g/dL — ABNORMAL LOW (ref 13.0–17.0)
MCH: 32.7 pg (ref 26.0–34.0)
MCHC: 31.1 g/dL (ref 30.0–36.0)
MCV: 105 fL — ABNORMAL HIGH (ref 80.0–100.0)
Platelets: 501 10*3/uL — ABNORMAL HIGH (ref 150–400)
RBC: 3.18 MIL/uL — ABNORMAL LOW (ref 4.22–5.81)
RDW: 15.8 % — AB (ref 11.5–15.5)
WBC: 13.3 10*3/uL — ABNORMAL HIGH (ref 4.0–10.5)
nRBC: 0.2 % (ref 0.0–0.2)

## 2018-09-10 LAB — GLUCOSE, CAPILLARY
GLUCOSE-CAPILLARY: 156 mg/dL — AB (ref 70–99)
Glucose-Capillary: 157 mg/dL — ABNORMAL HIGH (ref 70–99)
Glucose-Capillary: 138 mg/dL — ABNORMAL HIGH (ref 70–99)
Glucose-Capillary: 129 mg/dL — ABNORMAL HIGH (ref 70–99)

## 2018-09-10 MED ORDER — IOPAMIDOL (ISOVUE-370) INJECTION 76%
INTRAVENOUS | Status: AC
  Filled 2018-09-10: qty 100

## 2018-09-10 MED ORDER — SODIUM CHLORIDE 0.9% FLUSH: 10.0000 mL | INTRAVENOUS | Status: DC | PRN

## 2018-09-10 MED ORDER — IOPAMIDOL (ISOVUE-370) INJECTION 76%
75.0000 mL | Freq: Once | INTRAVENOUS | Status: AC | PRN
  Administered 2018-09-10: 75 mL via INTRAVENOUS

## 2018-09-10 NOTE — Progress Notes (Signed)
Physical Therapy Treatment Patient Details Name: Darren Frazier MRN: [id number] DOB: 1941/09/03 Today's Date: 09/10/2018    History of Present Illness 78 yo male  PMH including Cardiomyopathy (HCC), DM, Dysrhythmia, AAA, Hyperlipidemia, HTN, Hypothyroidism, Morbid obesity PVD, Stroke (2011), Tremors of nervous system. Presented to [facility] 12/25 with Rt leg numbness and weakness.  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  He was transferred to [facility].  He was intubated for neuro-IR intervention 12/25, self-extubated 12/30. MRI head left MCA/ACA territory scattered small acute infarcts; multiple scattered chronic underlying cortical infarcts involving the bilateral cerebral and right cerebellar hemispheres; moderately advanced cerebral atrophy with chronic small vessel.     PT Comments    Pt remains limited in his tolerance and participation to therapy.  However he was able to sit for longer period of time before being transferred to recliner with maximove lift.  Pt attempted sit to stand and able to follow commands for attempting transfer but unable to clear bottom from the bed.  Plan remains appropriate for SNF.  Based on cognitive deficits he was unable to participate in goal planning or d/c disposition.      Follow Up Recommendations  SNF     Equipment Recommendations  Wheelchair (measurements PT);Wheelchair cushion (measurements PT);Other (comment)(maximove lift and hospital bed if he returns home.  )    Recommendations for Other Services       Precautions / Restrictions Precautions Precautions: Fall Restrictions Weight Bearing Restrictions: No    Mobility  Bed Mobility Overal bed mobility: Needs Assistance Bed Mobility: Supine to Sit;Sit to Supine Rolling: Total assist;+2 for physical assistance   Supine to sit: Total assist;+2 for physical assistance     General bed mobility comments: Assist with B LEs to roll to sidelying and advance B LEs off edge of bed.  Pt required total +2 to  elevate trunk into sitting.  Once in sitting he held to the bed rail for support at edge of bed and sat unassisted for 10 minutes with cueing for posture and righting while PTA facilitated weight bearing to RUE.    Transfers Overall transfer level: Needs assistance               General transfer comment: +2 total bed to recliner transfer using maximove, attempted sit to stand at edge of bed but unable to clear bottom from bed.    Ambulation/Gait                 Stairs             Wheelchair Mobility    Modified Rankin (Stroke Patients Only)       Balance     Sitting balance-Leahy Scale: Poor Sitting balance - Comments: posteiror lean but able to sustain at midline for short periods  - with holding to bed rail he could sit and maintain balance for 10 min using L hand for support.   Postural control: Posterior lean                                  Cognition Arousal/Alertness: Lethargic;Suspect due to medications Behavior During Therapy: Flat affect Overall Cognitive Status: Impaired/Different from baseline Area of Impairment: Attention;Memory;Following commands;Safety/judgement;Awareness;Problem solving;Orientation                 Orientation Level: Disoriented to;Person;Place;Time;Situation(initially unable to recall person but later able to say his last name only.  ) Current Attention Level: Focused  Memory: Decreased recall of precautions;Decreased short-term memory Following Commands: Follows one step commands inconsistently;Follows one step commands with increased time Safety/Judgement: Decreased awareness of safety;Decreased awareness of deficits Awareness: Intellectual Problem Solving: Slow processing;Decreased initiation;Difficulty sequencing;Requires verbal cues;Requires tactile cues General Comments: Cues to follow commands.        Exercises      General Comments        Pertinent Vitals/Pain Pain Assessment:  Faces Faces Pain Scale: Hurts a little bit Pain Location: general discomfort Pain Descriptors / Indicators: Grimacing Pain Intervention(s): Monitored during session;Repositioned    Home Living                      Prior Function            PT Goals (current goals can now be found in the care plan section) Acute Rehab PT Goals Patient Stated Goal: per wife for pt to get better Potential to Achieve Goals: Fair Progress towards PT goals: Progressing toward goals    Frequency    Min 3X/week      PT Plan Current plan remains appropriate    Co-evaluation              AM-PAC PT "6 Clicks" Mobility   Outcome Measure  Help needed turning from your back to your side while in a flat bed without using bedrails?: Total Help needed moving from lying on your back to sitting on the side of a flat bed without using bedrails?: Total Help needed moving to and from a bed to a chair (including a wheelchair)?: Total Help needed standing up from a chair using your arms (e.g., wheelchair or bedside chair)?: Total Help needed to walk in hospital room?: Total Help needed climbing 3-5 steps with a railing? : Total 6 Click Score: 6    End of Session Equipment Utilized During Treatment: Oxygen Activity Tolerance: Patient tolerated treatment well Patient left: in chair;with call bell/phone within reach;with family/visitor present;with chair alarm set Nurse Communication: Mobility status;Need for lift equipment(informed of open sore on R hand and pitting/weeping edema.  ) PT Visit Diagnosis: Unsteadiness on feet (R26.81);Other abnormalities of gait and mobility (R26.89);Muscle weakness (generalized) (M62.81);Difficulty in walking, not elsewhere classified (R26.2)     Time: [id number] PT Time Calculation (min) (ACUTE ONLY): 36 min  Charges:  $Therapeutic Activity: 23-37 mins                     Joycelyn Rua, PTA Acute Rehabilitation Services Pager [phone] Office  [phone]     Climmie Cronce Artis Delay 09/10/2018, 6:18 PM

## 2018-09-11 LAB — BASIC METABOLIC PANEL
Anion gap: 9 (ref 5–15)
BUN: 16 mg/dL (ref 8–23)
CO2: 26 mmol/L (ref 22–32)
Calcium: 8.7 mg/dL — ABNORMAL LOW (ref 8.9–10.3)
Chloride: 106 mmol/L (ref 98–111)
Creatinine, Ser: 1.36 mg/dL — ABNORMAL HIGH (ref 0.61–1.24)
GFR calc Af Amer: 58 mL/min — ABNORMAL LOW (ref >60)
GFR calc non Af Amer: 50 mL/min — ABNORMAL LOW (ref >60)
Glucose, Bld: 193 mg/dL — ABNORMAL HIGH (ref 70–99)
POTASSIUM: 4.5 mmol/L (ref 3.5–5.1)
Sodium: 141 mmol/L (ref 135–145)

## 2018-09-11 LAB — CBC
HEMATOCRIT: 33.6 % — AB (ref 39.0–52.0)
HEMOGLOBIN: 10.3 g/dL — AB (ref 13.0–17.0)
MCH: 32.4 pg (ref 26.0–34.0)
MCHC: 30.7 g/dL (ref 30.0–36.0)
MCV: 105.7 fL — ABNORMAL HIGH (ref 80.0–100.0)
Platelets: 516 10*3/uL — ABNORMAL HIGH (ref 150–400)
RBC: 3.18 MIL/uL — AB (ref 4.22–5.81)
RDW: 15.7 % — ABNORMAL HIGH (ref 11.5–15.5)
WBC: 13.4 10*3/uL — ABNORMAL HIGH (ref 4.0–10.5)
nRBC: 0 % (ref 0.0–0.2)

## 2018-09-11 LAB — GLUCOSE, CAPILLARY
Glucose-Capillary: 186 mg/dL — ABNORMAL HIGH (ref 70–99)
Glucose-Capillary: 126 mg/dL — ABNORMAL HIGH (ref 70–99)
Glucose-Capillary: 132 mg/dL — ABNORMAL HIGH (ref 70–99)
Glucose-Capillary: 137 mg/dL — ABNORMAL HIGH (ref 70–99)

## 2018-09-11 MED ORDER — LORAZEPAM 2 MG/ML IJ SOLN
1.0000 mg | Freq: Once | INTRAMUSCULAR | Status: AC
  Administered 2018-09-11: 1 mg via INTRAVENOUS
  Filled 2018-09-11: qty 1

## 2018-09-11 MED ORDER — SODIUM BICARBONATE 8.4 % IV SOLN
INTRAVENOUS | Status: AC
  Filled 2018-09-11: qty 50

## 2018-09-11 NOTE — Progress Notes (Signed)
Nutrition Follow-up  DOCUMENTATION CODES:   Morbid obesity  INTERVENTION:  Continue Glucerna Shake po BID, each supplement provides 220 kcal and 10 grams of protein  Encourage adequate PO intake.   NUTRITION DIAGNOSIS:   Increased nutrient needs related to acute illness as evidenced by estimated needs; ongoing  GOAL:   Patient will meet greater than or equal to 90% of their needs; progressing  MONITOR:   PO intake, Supplement acceptance, Labs, Skin, Weight trends, I & O's  REASON FOR ASSESSMENT:   Ventilator, Consult Enteral/tube feeding initiation and management  ASSESSMENT:   78 year old male who presented to the [facility] ED on 12/25 with weakness, SOB, and chest pressure. CTA showing L ICA occlusion. Code Stroke. Pt did not receive TPA because he takes Pradaxa. PMH significant for HTN, T2DM, HLD, atrial fibrillation, chronic lymphedema, GERD. Pt transferred to [facility] and went to IR but unable to extract the clot. Extubated 12/30.   Meal completion has been varied from 0-90%. Pt currently has Glucerna shake ordered and has been consuming them. RD to continue with current orders to aid in caloric and protein needs.   Labs and medications reviewed.   Diet Order:   Diet Order            Diet heart healthy/carb modified Room service appropriate? Yes; Fluid consistency: Thin  Diet effective now              EDUCATION NEEDS:   No education needs have been identified at this time  Skin:  Skin Assessment: Skin Integrity Issues: Skin Integrity Issues:: Diabetic Ulcer, Other (Comment) Diabetic Ulcer: L toe Other: non pressure wound R hand  Last BM:  1/7  Height:   Ht Readings from Last 1 Encounters:  08/27/18 5\' 9"  (1.753 m)    Weight:   Wt Readings from Last 1 Encounters:  09/03/18 (!) 136.9 kg    Ideal Body Weight:  72.7 kg  BMI:  Body mass index is 44.57 kg/m.  Estimated Nutritional Needs:   Kcal:  1900-2100  Protein:  100-115 grams  Fluid:  > 1.9  L/day    Roslyn Smiling, MS, RD, LDN Pager # [phone] After hours/ weekend pager # [phone]

## 2018-09-11 NOTE — Progress Notes (Signed)
Pt fell from chair, when the bed alarm went off, before staff got to the room Pt was already on the floor, face down. Pt was helped to get to bed. Vital signs were within normal limits. Neurologist on call was paged, CT head STAT was ordered. Pt's wife called and notified.

## 2018-09-11 NOTE — Progress Notes (Signed)
SLP Cancellation Note  Patient Details Name: Darren Frazier MRN: [id number] DOB: November 14, 1940   Cancelled treatment:        Pt would not wake for therapy despite max level stimuli attempted for 3-4 minutes. Slight vocalization, no eye opening. Will continue efforts.   Royce Macadamia 09/11/2018, 3:53 PM

## 2018-09-11 NOTE — Progress Notes (Signed)
Placed patient on CPAP for the night with pressure set at 10cm and oxygen set at 2lpm  

## 2018-09-11 NOTE — Progress Notes (Signed)
STROKE TEAM PROGRESS NOTE   SUBJECTIVE (INTERVAL HISTORY) Wife at bedside. .fell yesterday evening out of bed but did not injure himself. CT scan of the head showed stable appearance of his left-sided infarcts and CT angiogram showed stable appearance of his multivessel stenosis without new acute finding.Remains afebrile and white count and BMPis stable.  OBJECTIVE Temp:  [97.6 F (36.4 C)-98.3 F (36.8 C)] 98.3 F (36.8 C) (01/09 1256) Pulse Rate:  [62-114] 98 (01/09 1256) Cardiac Rhythm: Atrial fibrillation (01/09 0731) Resp:  [18-22] 20 (01/09 1256) BP: (106-152)/(35-100) 146/61 (01/09 1256) SpO2:  [90 %-100 %] 98 % (01/09 1256) FiO2 (%):  [2 %] 2 % (01/09 1256)  Recent Labs  Lab 09/10/18 1117 09/10/18 1600 09/10/18 2121 09/11/18 0612 09/11/18 1132  GLUCAP 156* 138* 129* 186* 126*   Recent Labs  Lab 09/04/18 2157 09/05/18 0308  09/07/18 0907 09/08/18 1247 09/09/18 0620 09/10/18 0452 09/11/18 0625  NA 144 144   < > 144 141 142 141 141  K 3.3* 3.3*   < > 5.3* 4.0 4.4 4.2 4.5  CL 101 99   < > 109 103 105 106 106  CO2 31 34*   < > 23 27 26 24 26   GLUCOSE 156* 168*   < > 163* 152* 162* 150* 193*  BUN 11 12   < > 19 18 17 14 16   CREATININE 1.10 1.18   < > 1.23 1.33* 1.29* 1.25* 1.36*  CALCIUM 8.4* 8.5*   < > 9.4 8.9 9.2 9.0 8.7*  MG 1.9 1.8  --   --   --   --   --   --    < > = values in this interval not displayed.   No results for input(s): AST, ALT, ALKPHOS, BILITOT, PROT, ALBUMIN in the last 168 hours. Recent Labs  Lab 09/06/18 0605 09/08/18 1247 09/09/18 0620 09/10/18 0452 09/11/18 0625  WBC 16.6* 12.1* 12.2* 13.3* 13.4*  HGB 11.0* 10.0* 11.6* 10.4* 10.3*  HCT 34.0* 31.5* 36.4* 33.4* 33.6*  MCV 102.1* 102.6* 107.4* 105.0* 105.7*  PLT 488* 495* 369 501* 516*   No results for input(s): CKTOTAL, CKMB, CKMBINDEX, TROPONINI in the last 168 hours. No results for input(s): LABPROT, INR in the last 72 hours. No results for input(s): COLORURINE, LABSPEC, PHURINE,  GLUCOSEU, HGBUR, BILIRUBINUR, KETONESUR, PROTEINUR, UROBILINOGEN, NITRITE, LEUKOCYTESUR in the last 72 hours.  Invalid input(s): APPERANCEUR     Component Value Date/Time   CHOL 138 08/28/2018 0600   TRIG 171 (H) 08/28/2018 0921   HDL 24 (L) 08/28/2018 0600   CHOLHDL 5.8 08/28/2018 0600   VLDL 33 08/28/2018 0600   LDLCALC 81 08/28/2018 0600   Lab Results  Component Value Date   HGBA1C 7.1 (H) 08/28/2018   No results found for: LABOPIA, COCAINSCRNUR, LABBENZ, AMPHETMU, THCU, LABBARB  No results for input(s): ETH in the last 168 hours.  I have personally reviewed the radiological images below and agree with the radiology interpretations.  Ct Angio Head W Or Wo Contrast  Result Date: 08/27/2018 CLINICAL DATA:  Acute presentation with speech disturbance. EXAM: CT ANGIOGRAPHY HEAD AND NECK TECHNIQUE: Multidetector CT imaging of the head and neck was performed using the standard protocol during bolus administration of intravenous contrast. Multiplanar CT image reconstructions and MIPs were obtained to evaluate the vascular anatomy. Carotid stenosis measurements (when applicable) are obtained utilizing NASCET criteria, using the distal internal carotid diameter as the denominator. CONTRAST:  75mL ISOVUE-370 IOPAMIDOL (ISOVUE-370) INJECTION 76% COMPARISON:  CT same  day. FINDINGS: CTA NECK FINDINGS Aortic arch: Aortic atherosclerosis.  No aneurysm or dissection. Right carotid system: Common carotid artery shows some atherosclerotic plaque but is widely patent to the bifurcation. There is soft and calcified plaque affecting the carotid bifurcation and ICA bulb region. There is severe stenosis in the distal bulb region with luminal diameter of 1 mm. The vessel is quite tortuous beyond that but widely patent to the upper cervical region, where there is soft and calcified plaque resulting in minimal diameter of 3.5 mm. Compared to an expected diameter of 5 mm, stenosis in the bulb is 80% or greater.  Stenosis in the upper cervical region is 30%. Left carotid system: Common carotid artery shows atherosclerotic plaque but is sufficiently patent to the bifurcation region. There is calcified and soft plaque at the carotid bifurcation and ICA bulb. There is left internal carotid artery occlusion at the distal bulb. 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 The left vertebral artery shows 50% stenosis at its origin but is sufficiently patent beyond that through the cervical region to the foramen magnum. Skeleton: Mid cervical spondylosis. Other neck: No mass or lymphadenopathy. Upper chest: Interstitial prominence which could go along with fluid overload or mild edema. Review of the MIP images confirms the above findings CTA HEAD FINDINGS Anterior circulation: Left internal carotid artery is occluded without antegrade flow through the skull base. Right internal carotid artery shows atherosclerotic disease in the carotid siphon region with stenosis estimated at 50-70%. The anterior and middle cerebral vessels are patent. There is atherosclerotic irregularity of the M1 segment with stenosis of 50-70%. No acute vessel occlusion is identified. There is a chronic punctate calcified embolus in 1 of the insular branches, but this was present in 2014. On the left, there is reconstituted flow in the distal siphon. Severe stenosis of the supraclinoid ICA, 80% or greater. Flow is present in the anterior and middle cerebral vessels, secondary to reconstituted flow and flow through communicating arteries. There is 50% stenosis in the M1 segment. I do not see any occluded or missing large or medium vessels in the MCA territory. Posterior circulation: Right vertebral artery shows no antegrade flow at the foramen magnum. There is retrograde flow in the distal right vertebral. Left vertebral artery is patent at the foramen magnum. There is atherosclerotic disease of the V4 segment with stenosis  estimated at 70%. There are serial stenoses. The vessel does show flow to the basilar, which shows atherosclerotic irregularity but is patent. Flow is present in the superior cerebellar and posterior cerebral arteries. Venous sinuses: Patent and normal. Anatomic variants: None significant. Delayed phase: No abnormal enhancement. 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 Severe irregular atherosclerotic disease of the right carotid bifurcation with stenosis of 80% in the bulb region and 30% in the distal ICA. 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 50% stenosis of the left vertebral artery origin. Severe atherosclerotic disease in both carotid siphon regions. Right siphon stenosis estimated at 50-70%. Reconstituted flow in the distal left siphon and supraclinoid region. 80% supraclinoid stenosis. Flow in both anterior and middle cerebral artery territories. Stenosis of both M1 segments estimated at 50-70%. I do not identify any large or medium vessel occlusion acutely within the MCA branch vessels. Severely disease left V4 segment with serial stenoses proximal to the basilar estimated at 70%. Basilar atherosclerotic irregularity without flow limiting stenosis. Posterior circulation branch vessels do  show flow, including right PICA which is supplied by retrograde flow in the distal right vertebral artery. Electronically Signed   By: Paulina FusiMark  Shogry M.D.   On: 08/27/2018 13:14   Dg Chest 2 View  Result Date: 08/27/2018 CLINICAL DATA:  Chest pain and shortness of breath.  Cardiomyopathy. EXAM: CHEST - 2 VIEW COMPARISON:  03/07/2013 FINDINGS: Mild cardiomegaly. Aortic atherosclerosis. Diffuse interstitial infiltrates, consistent with pulmonary edema. Mild subsegmental atelectasis also seen in the left upper lobe. No evidence of pulmonary consolidation or significant pleural effusion. IMPRESSION: Mild cardiomegaly and diffuse  interstitial edema, consistent with congestive heart failure. Electronically Signed   By: Myles RosenthalJohn  Stahl M.D.   On: 08/27/2018 12:02   Ct Angio Neck W Or Wo Contrast  Result Date: 08/27/2018 CLINICAL DATA:  Acute presentation with speech disturbance. EXAM: CT ANGIOGRAPHY HEAD AND NECK TECHNIQUE: Multidetector CT imaging of the head and neck was performed using the standard protocol during bolus administration of intravenous contrast. Multiplanar CT image reconstructions and MIPs were obtained to evaluate the vascular anatomy. Carotid stenosis measurements (when applicable) are obtained utilizing NASCET criteria, using the distal internal carotid diameter as the denominator. CONTRAST:  75mL ISOVUE-370 IOPAMIDOL (ISOVUE-370) INJECTION 76% COMPARISON:  CT same day. FINDINGS: CTA NECK FINDINGS Aortic arch: Aortic atherosclerosis.  No aneurysm or dissection. Right carotid system: Common carotid artery shows some atherosclerotic plaque but is widely patent to the bifurcation. There is soft and calcified plaque affecting the carotid bifurcation and ICA bulb region. There is severe stenosis in the distal bulb region with luminal diameter of 1 mm. The vessel is quite tortuous beyond that but widely patent to the upper cervical region, where there is soft and calcified plaque resulting in minimal diameter of 3.5 mm. Compared to an expected diameter of 5 mm, stenosis in the bulb is 80% or greater. Stenosis in the upper cervical region is 30%. Left carotid system: Common carotid artery shows atherosclerotic plaque but is sufficiently patent to the bifurcation region. There is calcified and soft plaque at the carotid bifurcation and ICA bulb. There is left internal carotid artery occlusion at the distal bulb. 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 The left vertebral artery shows 50% stenosis at its origin but is sufficiently patent beyond that through the cervical region to the foramen  magnum. Skeleton: Mid cervical spondylosis. Other neck: No mass or lymphadenopathy. Upper chest: Interstitial prominence which could go along with fluid overload or mild edema. Review of the MIP images confirms the above findings CTA HEAD FINDINGS Anterior circulation: Left internal carotid artery is occluded without antegrade flow through the skull base. Right internal carotid artery shows atherosclerotic disease in the carotid siphon region with stenosis estimated at 50-70%. The anterior and middle cerebral vessels are patent. There is atherosclerotic irregularity of the M1 segment with stenosis of 50-70%. No acute vessel occlusion is identified. There is a chronic punctate calcified embolus in 1 of the insular branches, but this was present in 2014. On the left, there is reconstituted flow in the distal siphon. Severe stenosis of the supraclinoid ICA, 80% or greater. Flow is present in the anterior and middle cerebral vessels, secondary to reconstituted flow and flow through communicating arteries. There is 50% stenosis in the M1 segment. I do not see any occluded or missing large or medium vessels in the MCA territory. Posterior circulation: Right vertebral artery shows no antegrade flow at the foramen magnum. There is retrograde flow in the distal right vertebral. Left vertebral  artery is patent at the foramen magnum. There is atherosclerotic disease of the V4 segment with stenosis estimated at 70%. There are serial stenoses. The vessel does show flow to the basilar, which shows atherosclerotic irregularity but is patent. Flow is present in the superior cerebellar and posterior cerebral arteries. Venous sinuses: Patent and normal. Anatomic variants: None significant. Delayed phase: No abnormal enhancement. 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 Severe irregular atherosclerotic disease of the right carotid bifurcation with  stenosis of 80% in the bulb region and 30% in the distal ICA. 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 50% stenosis of the left vertebral artery origin. Severe atherosclerotic disease in both carotid siphon regions. Right siphon stenosis estimated at 50-70%. Reconstituted flow in the distal left siphon and supraclinoid region. 80% supraclinoid stenosis. Flow in both anterior and middle cerebral artery territories. Stenosis of both M1 segments estimated at 50-70%. I do not identify any large or medium vessel occlusion acutely within the MCA branch vessels. Severely disease left V4 segment with serial stenoses proximal to the basilar estimated at 70%. Basilar atherosclerotic irregularity without flow limiting stenosis. Posterior circulation branch vessels do show flow, including right PICA which is supplied by retrograde flow in the distal right vertebral artery. Electronically Signed   By: Paulina Fusi M.D.   On: 08/27/2018 13:14   Mr Brain Wo Contrast  Result Date: 08/28/2018 CLINICAL DATA:  Follow-up examination for acute stroke, right lower extremity weakness. Known left ICA occlusion. EXAM: MRI HEAD WITHOUT CONTRAST TECHNIQUE: Multiplanar, multiecho pulse sequences of the brain and surrounding structures were obtained without intravenous contrast. COMPARISON:  Prior CTs from 08/27/2018. FINDINGS: Brain: Generalized age-related cerebral atrophy. Patchy and confluent T2/FLAIR hyperintensity within the periventricular and deep white matter both cerebral hemispheres most consistent with chronic microvascular ischemic disease, moderate nature. Scatter remote cortical infarcts involving the posterior left frontoparietal region, right parietal lobe, and right frontal lobe are seen. Small remote right cerebellar infarct. Remote lacunar infarct present at the left caudate head. Scattered chronic hemosiderin staining seen about several of these infarcts. Scattered multifocal foci of restricted diffusion  involving the cortical subcortical left frontal, parietal, and temporal occipital regions, compatible with acute ischemic left MCA territory infarcts. Largest area of infarction seen at the high left frontal parietal region and measures 2.5 cm (series 3, image 48). Possible faint petechial hemorrhage about a few of these infarcts without frank hemorrhagic transformation or significant mass effect. Findings likely embolic in nature. No mass lesion, midline shift or mass effect. Mild diffuse ventricular prominence related global parenchymal volume loss without hydrocephalus. No extra-axial fluid collection. Pituitary gland within normal limits. Vascular: Abnormal flow void within the left ICA to its cavernous segment, compatible with previously identified left ICA occlusion. Major intracranial vascular flow voids otherwise maintained at the skull base. Skull and upper cervical spine: Craniocervical junction within normal limits. Upper cervical spine normal. Bone marrow signal intensity within normal limits. No scalp soft tissue abnormality. Sinuses/Orbits: Patient status post bilateral ocular lens replacement. Globes and orbital soft tissues demonstrate no acute finding. Scattered mucosal thickening seen throughout the paranasal sinuses with superimposed scattered air-fluid levels. Fluid seen within the nasopharynx. Patient likely intubated. Trace bilateral mastoid effusions noted. Inner ear structures normal. Other: None. IMPRESSION: 1. 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 Possible faint scattered associated petechial hemorrhage without frank hemorrhagic transformation or significant mass effect. 2.  Abnormal flow void within the left ICA to its cavernous segment, consistent with known left ICA occlusion. 3. 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 4. Moderately advanced  cerebral atrophy with chronic small vessel ischemic disease. Electronically Signed   By: Rise Mu M.D.   On: 08/28/2018 15:33   Ct Abdomen Pelvis W Contrast  Result Date: 08/29/2018 CLINICAL DATA:  Abdominal distention.  Bloating. EXAM: CT ABDOMEN AND PELVIS WITH CONTRAST TECHNIQUE: Multidetector CT imaging of the abdomen and pelvis was performed using the standard protocol following bolus administration of intravenous contrast. CONTRAST:  OMNIPAQUE IOHEXOL 300 MG/ML  SOLN COMPARISON:  Abdominal radiograph dated 08/28/2018 and abdominal ultrasound dated 02/28/2016 FINDINGS: Lower chest: There are bilateral consolidative infiltrates in the lower lobes as well as minimal bilateral pleural effusions. Aortic atherosclerosis. Coronary artery calcifications. Hepatobiliary: Liver parenchyma is normal. No biliary ductal dilatation. Vicarious excretion of contrast in the normal appearing gallbladder. Pancreas: Unremarkable. No pancreatic ductal dilatation or surrounding inflammatory changes. Spleen: Normal in size without focal abnormality. Adrenals/Urinary Tract: Adrenal glands are normal. Normal right kidney. Large chronic cyst in the lower pole of the left kidney with some calcification in the wall. Maximum diameter of the cyst is approximately 9 cm. Stomach/Bowel: NG tube tip in the body of the stomach. There are few diverticula in the proximal sigmoid portion of the colon. There is moderate air in the colon without significant distention. Small bowel appears normal. Fluid and air in the stomach. Appendix has been removed. Vascular/Lymphatic: There is a poorly defined hematoma in the right inguinal region in the panniculus. This measures approximately 12 x 5 x 2 cm and is probably related to recent angiography. There are several small lymph nodes in the right inguinal region, the largest being 15 mm in diameter. Extensive aortic atherosclerosis. No adenopathy. Reproductive: Dense calcification in  the normal-sized prostate gland. Other: There is a small amount of ascites in the abdomen, nonspecific. Small periumbilical hernia containing only fat. Edema in the subcutaneous fat of both flanks, right greater than left. Slight edema in the panniculus and in the lateral aspect of both thighs. Musculoskeletal: No acute abnormalities. Multilevel degenerative disc disease in the thoracic spine and lumbar spine. IMPRESSION: 1. Small hematoma in the right inguinal region in the panniculus, probably secondary to the recent angiography. 2. Bilateral consolidative infiltrates in the posterior aspect of both lower lobes with tiny adjacent pleural effusions. 3. Small amount of nonspecific ascites. 4. Slightly prominent gas in the bowel without evidence of bowel obstruction. This probably represents an ileus. Electronically Signed   By: Francene Boyers M.D.   On: 08/29/2018 17:03   Ir Ct Head Ltd  Result Date: 08/31/2018 CLINICAL DATA:  New onset right-sided weakness with mild word-finding difficulties. 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 EXAM: IR ANGIO INTRA EXTRACRAN SEL COM CAROTID INNOMINATE UNI RIGHT MOD SED; IR PERCUTANEOUS ART THORMBECTOMY/INFUSION INTRACRANIAL INCLUDE DIAG ANGIO; IR ANGIO VERTEBRAL SEL SUBCLAVIAN INNOMINATE UNI LEFT MOD SED; IR CT HEAD LIMITED COMPARISON:  CT angiogram of the head and neck of 08/27/2018 MEDICATIONS: Heparin 3,000 units IV; . Ancef 2 g IV antibiotic was administered within 1 hour of the procedure ANESTHESIA/SEDATION: General anesthesia CONTRAST:  Isovue 300 approximately 100 mL FLUOROSCOPY TIME:  Fluoroscopy Time: 60 minutes 0 seconds (4327 mGy). COMPLICATIONS:  None immediate. TECHNIQUE: Informed written consent was obtained from the patient after a thorough discussion of the procedural risks, benefits and  alternatives. All questions were addressed. Maximal Sterile Barrier Technique was utilized including caps, mask, sterile gowns, sterile gloves, sterile drape, hand hygiene and skin antiseptic. A timeout was performed prior to the initiation of the procedure. The right groin was prepped and draped in the usual sterile fashion. Thereafter using modified Seldinger technique, transfemoral access into the right common femoral artery was obtained without difficulty. Over a 0.035 inch guidewire, a 5 French Pinnacle sheath was inserted. Through this, and also over 0.035 inch guidewire, a 5 [place] JB 1 catheter was advanced to the aortic arch region and selectively positioned in the right common carotid artery, the innominate artery , the left common carotid artery and the left vertebral artery. FINDINGS: The left common carotid artery tear g demonstrates a moderate tortuosity of the proximal left common carotid artery The left external carotid artery proximally has a mild to moderate stenosis. Is branches are otherwise normally opacified The left internal carotid artery at the bulb would demonstrates the complete angiographic occlusion with no evidence of a string sign on the delayed arterial images. The innominate artery arteriogram demonstrates patency of the right subclavian artery and the right common carotid artery proximally There is no angiographic evidence of the right vertebral artery The right common carotid bifurcation demonstrates the right external carotid artery and its major branches to be widely patent The right internal carotid artery just distal to the bulb has a severe 90% plus stenosis. Distal to this there is a U-shaped configuration of the right internal carotid artery. More distally the vessel is normal caliber The petrous the cavernous segments and supraclinoid segments are widely patent There is mild atherosclerotic irregularity involving the proximal cavernous, and distal cavernous portions of the  right internal carotid artery. Right posterior communicating artery is seen opacifying the right posterior cerebral artery and the distal basilar artery. The right middle cerebral artery has approximately 30 40% stenosis of the right M1 segment The trifurcation branches appear to be widely patent into the capillary and venous phases The right anterior cerebral artery A1 segment and distally demonstrate wide patency is into the capillary and venous phases There is simultaneous cross-filling via the anterior communicating artery of the left anterior cerebral artery A2 segment and distally. Also demonstrated is opacification of the left anterior cerebral A1 segment, with flow noted into the left middle cerebral artery distribution. Unopacified blood is seen in the left middle cerebral artery from the collateral circulation. 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 More distally the vessel is seen to opacify to the cranial skull base There is severe focal of focal stenosis of the left vertebrobasilar junction just distal to the origin of the left posterior-inferior cerebellar artery Additionally there is a approximately 70% stenosis of the proximal basilar artery More distally the basilar artery demonstrates mild caliber irregularity. There is a opacification of the left posterior cerebral artery the superior cerebellar arteries left greater than right, and the anterior-inferior cerebellar arteries Retrograde opacification of the right vertebrobasilar junction to the level of the right posterior-inferior cerebellar arteries is noted. Caliber irregularity of the right posterior-inferior cerebellar artery suggests intracranial  arteriosclerosis.  Left internal carotid artery occlusion or revascularization attempt The diagnostic JB 1 catheter left common carotid artery was then exchanged over a 0.035 inch 300 cm a 7 French Pinnacle sheath in the right groin. A diagnostic JB 1 catheter were then advanced over the rise exchange guidewire to the descending thoracic aorta. The 7 French Pinnacle sheath was then exchanged over a 0.035 inch 100 cm Amplatz stiff guidewire for a a 8 French 55 cm Brite tip neurovascular sheath using biplane roadmap technique and constant fluoroscopic guidance. Good good aspiration was obtained from the side port of the neurovascular sheath. This was then connected to continuous heparinized saline infusion. The embolus guidewire was then removed. Through the 8 French neurovascular sheath, over a 0.035 inch roadrunner guidewire, a 5 5 [place] JB 1 catheter was advanced to the recover region and select position in the left common carotid artery. Using biplane technique and constant fluoroscopic guidance, over the of the 5 inch roadrunner guidewire, the 5 [place] JB 1 catheter was advanced to the left external carotid artery and exchanged over a 0.035 inch 300 cm rise exchange guidewire for an 8 French 85 cm flow gap balloon guide catheter which had been prepped with 50% contrast and 50% heparinized saline infusion The guidewire was removed. Good aspiration obtained from the hub of the 8 [place] Flo guide catheter. Gentle contrast injection demonstrated no evidence of spasms dissections or of intraluminal filling defects. At this time, in a coaxial manner and with constant heparinized saline infusion, a tree approached 1 microcatheter was advanced over a point 0.014 inch soft tip synchro micro guidewire to the distal end of the Flo guide catheter The Flood guide catheter was advanced to just inside the left internal carotid artery bulb. Thereafter, multiple attempts were made to advance the microcatheter over the micro  guidewire first with Ace synchro soft and then a regular synchro guidewire. In 016 headliner double angled micro guidewire was also utilized in order to access the occluded left internal carotid artery. It was noted that there was a severe tortuosity at the junction of the proximal and the middle 1/3 of the left internal carotid artery Following multiple attempts it was decided to stop. A control was then performed through the [facility] guide catheter in the left common carotid artery which revealed brisk cross-filling of the left internal carotid artery distal cavernous and supraclinoid segments from the left external carotid artery branches via the ipsilateral ophthalmic artery Flow is noted into the left middle cerebral artery as well Also noted was a retrograde opacification from the left posterior communicating artery into the supraclinoid left ICA and then middle cerebral artery An earlier diagnostic catheter arteriogram of the right common carotid artery he had revealed partial cross-filling via the anterior communicating artery from the right internal carotid artery Given the the collateral circulation, it was decided to stop the procedure. Throughout the procedure, the patient's blood pressure and neurological status remained stable No evidence of hemodynamic instability was noted No gross mass-effect or move filling defects or extravasation was seen Prior to the attempted revascularization of the left internal carotid artery occlusion, the patient was given 180 mg of Brilinta, and 81 mg of aspirin orally the an orogastric tube. Also the patient was loaded with 3000 units IV heparin in order to facilitate revascularization. The the 8 [place] flu guide catheter was then retrieved and removed. The 8 [place] Brite tip neurovascular sheath was then exchanged over a J-tip guidewire for a in 8 [place] 35 cm  Brite tip neurovascular sheath. This was then connected to continuous heparinized saline infusion An arteriogram  performed through this 3 5 cm 8 French Brite tip neurovascular sheath in the abdominal aorta reveal excellent flow through the common iliacs, the external and internal iliacs, and also the visualized portions of the common femoral arteries below the level of the inguinal ligaments. At the end of procedure, a CT of the brain revealed no evidence of gross intracranial hemorrhage,, or mass effect or midline shift. The patient was left intubated on account of his wound at the time of his intubation The right groin appeared soft without evidence of a hematoma. Distal pulses in the dorsalis pedis, and posterior regions remained dopplerable bilaterally. The patient was then transferred to the neuro ICU for further post stroke management. IMPRESSION: . 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 Approximately 90% stenosis of the dominant left vertebrobasilar junction distal to the origin of the left posterior-inferior cerebellar artery, and of approximately 70% of the proximal basilar artery. 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 PLAN: Follow-up in the clinic 1 month post discharge Electronically Signed   By: Julieanne Cotton M.D.   On: 08/28/2018 16:08   Ct Cerebral Perfusion W Contrast  Result Date: 08/27/2018 CLINICAL DATA:  Right-sided weakness onset this morning. EXAM: CT PERFUSION BRAIN TECHNIQUE: Multiphase CT imaging of the brain was performed following IV bolus contrast injection. Subsequent parametric perfusion maps were calculated using RAPID software. CONTRAST:  40mL ISOVUE-370 IOPAMIDOL (ISOVUE-370) INJECTION 76% COMPARISON:  CT a head today FINDINGS: CT Brain Perfusion Findings: CBF (<30%) Volume: 0mL Perfusion (Tmax>6.0s) volume: Mismatch Volume: ASPECTS on noncontrast CT Head today, not calculated due to extensive  chronic ischemic change. No definite acute infarct on CT. Infarct Core: 0 mL Infarction Location:Delayed perfusion throughout the left MCA territory with apparent sparing of the basal ganglia. There is occlusion of the left internal carotid artery on CTA, age indeterminate. Left middle cerebral artery is diseased and may have delayed perfusion due to collateral circulation and stenosis. IMPRESSION: 155 mL of delayed perfusion in the left MCA territory. No core infarction. Delayed perfusion left MCA territory may be due to severe intracranial atherosclerotic disease as well as occlusion of the left internal carotid artery. Age of the internal carotid artery occlusion is indeterminate. It is possible this delayed perfusion is a chronic or possibly acute finding. Electronically Signed   By: Marlan Palau M.D.   On: 08/27/2018 16:12   Dg Chest Port 1 View  Result Date: 09/03/2018 CLINICAL DATA:  Shortness of breath. EXAM: PORTABLE CHEST 1 VIEW COMPARISON:  Radiograph September 02, 2018. FINDINGS: Stable cardiomegaly. Atherosclerosis of thoracic aorta is noted. No pneumothorax is noted. Right internal jugular catheter is unchanged in position. Stable bilateral pulmonary edema is noted with minimal pleural effusions. Bony thorax is unremarkable. IMPRESSION: Stable cardiomegaly with bilateral pulmonary edema and minimal pleural effusions. Aortic Atherosclerosis (ICD10-I70.0). Electronically Signed   By: Lupita Raider, M.D.   On: 09/03/2018 12:33   Dg Chest Port 1 View  Result Date: 09/02/2018 CLINICAL DATA:  Acute respiratory distress. EXAM: PORTABLE CHEST 1 VIEW COMPARISON:  One-view chest x-ray 09/01/2018 FINDINGS: The heart is enlarged. Patient has been extubated. NG tube was removed. A right IJ line remains. Atherosclerotic changes are present at the aortic arch. Diffuse interstitial edema is stable. Bibasilar airspace disease is worse on the left.  IMPRESSION: 1. Interval extubation and removal of NG tube. 2.  Stable cardiomegaly and edema consistent with congestive heart failure. 3. Aortic atherosclerosis. Electronically Signed   By: Marin Roberts M.D.   On: 09/02/2018 07:29   Dg Chest Port 1 View  Result Date: 09/01/2018 CLINICAL DATA:  Respiratory failure. EXAM: PORTABLE CHEST 1 VIEW COMPARISON:  08/31/2018 FINDINGS: Endotracheal tube terminates 4.2 cm above the carina. Right jugular catheter terminates over the mid SVC. Enteric tube courses into the left upper abdomen. The cardiac silhouette remains enlarged. Patchy airspace opacities throughout both lungs have not significantly changed. IMPRESSION: Bilateral airspace disease without significant interval change. Electronically Signed   By: Sebastian Ache M.D.   On: 09/01/2018 06:54   Dg Chest Port 1 View  Result Date: 08/31/2018 CLINICAL DATA:  CXR for Respiratory failure. Hx of HTN, stroke, AAA, Diabetes, and Cardiomyopathy. EXAM: PORTABLE CHEST 1 VIEW COMPARISON:  08/30/2018 FINDINGS: Endotracheal tube is in place, tip 3.8 centimeters above the carina. Nasogastric tube is in place, tip beyond the gastroesophageal junction and off the image. A RIGHT IJ central line tip overlies the superior vena cava. Patient is rotated towards the LEFT. The heart is enlarged. There is persistent airspace filling opacity throughout the RIGHT lung, slightly improved. There is significant opacity in the LEFT lung base, consistent with atelectasis or consolidation and increased since 08/27/2018. IMPRESSION: 1. Slight improvement in aeration of the RIGHT lung. 2. Increased opacity in the LEFT lung base. Electronically Signed   By: Norva Pavlov M.D.   On: 08/31/2018 09:52   Dg Chest Port 1 View  Result Date: 08/30/2018 CLINICAL DATA:  Respiratory failure. Cardiomyopathy. EXAM: PORTABLE CHEST 1 VIEW COMPARISON:  08/29/2018 and 08/27/2018 FINDINGS: Endotracheal tube and NG tube and central line appear unchanged and in good position. There has been progression of the  bilateral diffuse pulmonary infiltrates, particularly in the right upper lung zone of the left lung base. Heart size and pulmonary vascularity are normal. No discrete effusions. Aortic atherosclerosis. No acute bone abnormality. IMPRESSION: Progressive bilateral pulmonary infiltrates. Aortic Atherosclerosis (ICD10-I70.0). Electronically Signed   By: Francene Boyers M.D.   On: 08/30/2018 08:48   Dg Chest Port 1 View  Result Date: 08/29/2018 CLINICAL DATA:  Central line placement EXAM: PORTABLE CHEST 1 VIEW COMPARISON:  08/29/2018 FINDINGS: Right central line is been placed with the tip in the SVC. No pneumothorax. Endotracheal tube and NG tube are unchanged. Cardiomegaly with vascular congestion and mild pulmonary edema, stable. IMPRESSION: Right central line tip in the SVC. No pneumothorax. Continued mild CHF. Electronically Signed   By: Charlett Nose M.D.   On: 08/29/2018 11:58   Dg Chest Port 1 View  Result Date: 08/29/2018 CLINICAL DATA:  PT presented [facility] ED on 08/27/18 with complaints of right lower extremity weakness, dyspnea and chest pressure. Hx of stroke, Peripheral vascualar disease, cardiomyopathy, and A-fib. EXAM: PORTABLE CHEST 1 VIEW COMPARISON:  08/27/2018 FINDINGS: Mild enlargement of the cardiac silhouette, stable. No mediastinal or hilar masses. There is central vascular congestion. Mild hazy opacity noted in the perihilar regions and medial lung bases bilaterally. No convincing pleural effusion and no pneumothorax. Endotracheal tube tip projects 3.8 cm above the Carina. Nasogastric tube passes below the diaphragm well into the stomach. IMPRESSION: 1. Findings are consistent with mild congestive heart failure with evidence of slight improvement when compared to 08/27/2018. No new abnormalities. 2. Support apparatus is well positioned. Electronically Signed   By: Amie Portland M.D.   On: 08/29/2018 07:26  Dg Chest Port 1 View  Result Date: 08/28/2018 CLINICAL DATA:  Shortness of  Breath EXAM: PORTABLE CHEST 1 VIEW COMPARISON:  08/27/2018 FINDINGS: Cardiac shadow is enlarged in size. Aortic calcifications are noted. Endotracheal tube is noted at the level of the carina. This should be withdrawn 2-3 cm. Nasogastric catheter is noted within the stomach. Lungs are well aerated bilaterally with mild interstitial edema similar to that seen on the prior exam. IMPRESSION: Interval intubation with the endotracheal tube at the level of the carina. This should be withdrawn 2-3 cm. Stable edema bilaterally. These results will be called to the ordering clinician or representative by the Radiologist Assistant, and communication documented in the PACS or zVision Dashboard. Electronically Signed   By: Alcide Clever M.D.   On: 08/28/2018 00:19   Dg Abd Portable 1v  Result Date: 08/28/2018 CLINICAL DATA:  Orogastric tube placement. EXAM: PORTABLE ABDOMEN - 1 VIEW COMPARISON:  None. FINDINGS: The patient's enteric tube is seen ending overlying the body of the stomach, with the side port about the fundus of the stomach. The visualized bowel gas pattern is grossly unremarkable. A small left pleural effusion is noted. Increased interstitial markings raise concern for pulmonary edema. Vascular congestion is noted. The patient's endotracheal tube is seen ending 2-3 cm above the carina. No acute osseous abnormalities are identified. IMPRESSION: 1. Enteric tube seen ending overlying the body of the stomach, with the side port about the fundus of the stomach. 2. Small left pleural effusion noted. Increased interstitial markings raise concern for pulmonary edema. Electronically Signed   By: Roanna Raider M.D.   On: 08/28/2018 22:08   Ir Percutaneous Art Thrombectomy/infusion Intracranial Inc Diag Angio  Result Date: 08/31/2018 CLINICAL DATA:  New onset right-sided weakness with mild word-finding difficulties. Abnormal CT angiogram of the head and neck revealing non flow limiting filling defect in the left  middle cerebral artery M1 segment, and also acute occlusion of the left internal carotid artery at the bulb. EXAM: IR ANGIO INTRA EXTRACRAN SEL COM CAROTID INNOMINATE UNI RIGHT MOD SED; IR PERCUTANEOUS ART THORMBECTOMY/INFUSION INTRACRANIAL INCLUDE DIAG ANGIO; IR ANGIO VERTEBRAL SEL SUBCLAVIAN INNOMINATE UNI LEFT MOD SED; IR CT HEAD LIMITED COMPARISON:  CT angiogram of the head and neck of 08/27/2018 MEDICATIONS: Heparin 3,000 units IV; . Ancef 2 g IV antibiotic was administered within 1 hour of the procedure ANESTHESIA/SEDATION: General anesthesia CONTRAST:  Isovue 300 approximately 100 mL FLUOROSCOPY TIME:  Fluoroscopy Time: 60 minutes 0 seconds (4327 mGy). COMPLICATIONS: None immediate. TECHNIQUE: Informed written consent was obtained from the patient after a thorough discussion of the procedural risks, benefits and alternatives. All questions were addressed. Maximal Sterile Barrier Technique was utilized including caps, mask, sterile gowns, sterile gloves, sterile drape, hand hygiene and skin antiseptic. A timeout was performed prior to the initiation of the procedure. The right groin was prepped and draped in the usual sterile fashion. Thereafter using modified Seldinger technique, transfemoral access into the right common femoral artery was obtained without difficulty. Over a 0.035 inch guidewire, a 5 French Pinnacle sheath was inserted. Through this, and also over 0.035 inch guidewire, a 5 [place] JB 1 catheter was advanced to the aortic arch region and selectively positioned in the right common carotid artery, the innominate artery , the left common carotid artery and the left vertebral artery. FINDINGS: The left common carotid artery tear g demonstrates a moderate tortuosity of the proximal left common carotid artery The left external carotid artery proximally has a mild to moderate stenosis.  Is branches are otherwise normally opacified The left internal carotid artery at the bulb would demonstrates the  complete angiographic occlusion with no evidence of a string sign on the delayed arterial images. The innominate artery arteriogram demonstrates patency of the right subclavian artery and the right common carotid artery proximally There is no angiographic evidence of the right vertebral artery The right common carotid bifurcation demonstrates the right external carotid artery and its major branches to be widely patent The right internal carotid artery just distal to the bulb has a severe 90% plus stenosis. Distal to this there is a U-shaped configuration of the right internal carotid artery. More distally the vessel is normal caliber The petrous the cavernous segments and supraclinoid segments are widely patent There is mild atherosclerotic irregularity involving the proximal cavernous, and distal cavernous portions of the right internal carotid artery. Right posterior communicating artery is seen opacifying the right posterior cerebral artery and the distal basilar artery. The right middle cerebral artery has approximately 30 40% stenosis of the right M1 segment The trifurcation branches appear to be widely patent into the capillary and venous phases The right anterior cerebral artery A1 segment and distally demonstrate wide patency is into the capillary and venous phases There is simultaneous cross-filling via the anterior communicating artery of the left anterior cerebral artery A2 segment and distally. Also demonstrated is opacification of the left anterior cerebral A1 segment, with flow noted into the left middle cerebral artery distribution. Unopacified blood is seen in the left middle cerebral artery from the collateral circulation. The left subclavian arteriogram demonstrates mild stenosis at the origin of the right subclavian artery Mild atherosclerotic disease is noted of the subclavian artery and in the region of the thyrocervical trunk The dominant left vertebral artery origin is widely patent The vessel  demonstrates mild tortuosity just distal to this. More distally the vessel is seen to opacify to the cranial skull base There is severe focal of focal stenosis of the left vertebrobasilar junction just distal to the origin of the left posterior-inferior cerebellar artery Additionally there is a approximately 70% stenosis of the proximal basilar artery More distally the basilar artery demonstrates mild caliber irregularity. There is a opacification of the left posterior cerebral artery the superior cerebellar arteries left greater than right, and the anterior-inferior cerebellar arteries Retrograde opacification of the right vertebrobasilar junction to the level of the right posterior-inferior cerebellar arteries is noted. Caliber irregularity of the right posterior-inferior cerebellar artery suggests intracranial arteriosclerosis. Left internal carotid artery occlusion or revascularization attempt The diagnostic JB 1 catheter left common carotid artery was then exchanged over a 0.035 inch 300 cm a 7 French Pinnacle sheath in the right groin. A diagnostic JB 1 catheter were then advanced over the rise exchange guidewire to the descending thoracic aorta. The 7 French Pinnacle sheath was then exchanged over a 0.035 inch 100 cm Amplatz stiff guidewire for a a 8 French 55 cm Brite tip neurovascular sheath using biplane roadmap technique and constant fluoroscopic guidance. Good good aspiration was obtained from the side port of the neurovascular sheath. This was then connected to continuous heparinized saline infusion. The embolus guidewire was then removed. Through the 8 French neurovascular sheath, over a 0.035 inch roadrunner guidewire, a 5 5 [place] JB 1 catheter was advanced to the recover region and select position in the left common carotid artery. Using biplane technique and constant fluoroscopic guidance, over the of the 5 inch roadrunner guidewire, the 5 [place] JB 1 catheter was advanced  to the left external  carotid artery and exchanged over a 0.035 inch 300 cm rise exchange guidewire for an 8 French 85 cm flow gap balloon guide catheter which had been prepped with 50% contrast and 50% heparinized saline infusion The guidewire was removed. Good aspiration obtained from the hub of the 8 [place] Flo guide catheter. Gentle contrast injection demonstrated no evidence of spasms dissections or of intraluminal filling defects. At this time, in a coaxial manner and with constant heparinized saline infusion, a tree approached 1 microcatheter was advanced over a point 0.014 inch soft tip synchro micro guidewire to the distal end of the Flo guide catheter The Flood guide catheter was advanced to just inside the left internal carotid artery bulb. Thereafter, multiple attempts were made to advance the microcatheter over the micro guidewire first with Ace synchro soft and then a regular synchro guidewire. In 016 headliner double angled micro guidewire was also utilized in order to access the occluded left internal carotid artery. It was noted that there was a severe tortuosity at the junction of the proximal and the middle 1/3 of the left internal carotid artery Following multiple attempts it was decided to stop. A control was then performed through the [facility] guide catheter in the left common carotid artery which revealed brisk cross-filling of the left internal carotid artery distal cavernous and supraclinoid segments from the left external carotid artery branches via the ipsilateral ophthalmic artery Flow is noted into the left middle cerebral artery as well Also noted was a retrograde opacification from the left posterior communicating artery into the supraclinoid left ICA and then middle cerebral artery An earlier diagnostic catheter arteriogram of the right common carotid artery he had revealed partial cross-filling via the anterior communicating artery from the right internal carotid artery Given the the collateral circulation, it  was decided to stop the procedure. Throughout the procedure, the patient's blood pressure and neurological status remained stable No evidence of hemodynamic instability was noted No gross mass-effect or move filling defects or extravasation was seen Prior to the attempted revascularization of the left internal carotid artery occlusion, the patient was given 180 mg of Brilinta, and 81 mg of aspirin orally the an orogastric tube. Also the patient was loaded with 3000 units IV heparin in order to facilitate revascularization. The the 8 [place] flu guide catheter was then retrieved and removed. The 8 [place] Brite tip neurovascular sheath was then exchanged over a J-tip guidewire for a in 8 French 35 cm Brite tip neurovascular sheath. This was then connected to continuous heparinized saline infusion An arteriogram performed through this 3 5 cm 8 [place] Brite tip neurovascular sheath in the abdominal aorta reveal excellent flow through the common iliacs, the external and internal iliacs, and also the visualized portions of the common femoral arteries below the level of the inguinal ligaments. At the end of procedure, a CT of the brain revealed no evidence of gross intracranial hemorrhage,, or mass effect or midline shift. The patient was left intubated on account of his wound at the time of his intubation The right groin appeared soft without evidence of a hematoma. Distal pulses in the dorsalis pedis, and posterior regions remained dopplerable bilaterally. The patient was then transferred to the neuro ICU for further post stroke management. IMPRESSION: . 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 Approximately 90% stenosis of the dominant left  vertebrobasilar junction distal to the origin of the left posterior-inferior cerebellar artery, and of approximately 70% of the  proximal basilar artery. 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 PLAN: Follow-up in the clinic 1 month post discharge Electronically Signed   By: Julieanne Cotton M.D.   On: 08/28/2018 16:08   Ct Head Code Stroke Wo Contrast`  Result Date: 08/27/2018 CLINICAL DATA:  Code stroke. Slurred speech beginning 3 weeks ago. Right-sided weakness beginning today. EXAM: CT HEAD WITHOUT CONTRAST TECHNIQUE: Contiguous axial images were obtained from the base of the skull through the vertex without intravenous contrast. COMPARISON:  CT 03/07/2013 FINDINGS: Brain: The brainstem and cerebellum appear normal by CT. Cerebral hemispheres show age related volume loss. There are old cortical and subcortical infarctions in the right frontal lobe, left parietal vertex, left frontal lobe, and left frontoparietal vertex. The show atrophy and encephalomalacia with gliosis. There is no finding of acute or subacute infarction on this CT. There is old lacunar infarction in the left caudate. Vascular: There is atherosclerotic calcification of the major vessels at the base of the brain. No evidence of acute hyperdense vessel. Skull: Negative Sinuses/Orbits: Clear/normal Other: None ASPECTS ([place] Stroke Program Early CT Score) Aspects is difficult in this patient with old infarctions. I do not suspect an acute insult. IMPRESSION: 1. No acute finding by CT. 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 2. 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 No acute finding is suspected by CT. 3. These results were called by telephone at the time of interpretation on 08/27/2018 at 11:47 am to Dr. Daryel November , who verbally acknowledged these results. Electronically Signed   By: Paulina Fusi M.D.   On: 08/27/2018 11:49   Ir Angio Intra Extracran Sel Com Carotid Innominate Uni R Mod Sed  Result Date:  08/31/2018 CLINICAL DATA:  New onset right-sided weakness with mild word-finding difficulties. 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 EXAM: IR ANGIO INTRA EXTRACRAN SEL COM CAROTID INNOMINATE UNI RIGHT MOD SED; IR PERCUTANEOUS ART THORMBECTOMY/INFUSION INTRACRANIAL INCLUDE DIAG ANGIO; IR ANGIO VERTEBRAL SEL SUBCLAVIAN INNOMINATE UNI LEFT MOD SED; IR CT HEAD LIMITED COMPARISON:  CT angiogram of the head and neck of 08/27/2018 MEDICATIONS: Heparin 3,000 units IV; . Ancef 2 g IV antibiotic was administered within 1 hour of the procedure ANESTHESIA/SEDATION: General anesthesia CONTRAST:  Isovue 300 approximately 100 mL FLUOROSCOPY TIME:  Fluoroscopy Time: 60 minutes 0 seconds (4327 mGy). COMPLICATIONS: None immediate. TECHNIQUE: Informed written consent was obtained from the patient after a thorough discussion of the procedural risks, benefits and alternatives. All questions were addressed. Maximal Sterile Barrier Technique was utilized including caps, mask, sterile gowns, sterile gloves, sterile drape, hand hygiene and skin antiseptic. A timeout was performed prior to the initiation of the procedure. The right groin was prepped and draped in the usual sterile fashion. Thereafter using modified Seldinger technique, transfemoral access into the right common femoral artery was obtained without difficulty. Over a 0.035 inch guidewire, a 5 French Pinnacle sheath was inserted. Through this, and also over 0.035 inch guidewire, a 5 [place] JB 1 catheter was advanced to the aortic arch region and selectively positioned in the right common carotid artery, the innominate artery , the left common carotid artery and the left vertebral artery. FINDINGS: The left common  carotid artery tear g demonstrates a moderate tortuosity of the proximal left common carotid artery  The left external carotid artery proximally has a mild to moderate stenosis. Is branches are otherwise normally opacified The left internal carotid artery at the bulb would demonstrates the complete angiographic occlusion with no evidence of a string sign on the delayed arterial images. The innominate artery arteriogram demonstrates patency of the right subclavian artery and the right common carotid artery proximally There is no angiographic evidence of the right vertebral artery The right common carotid bifurcation demonstrates the right external carotid artery and its major branches to be widely patent The right internal carotid artery just distal to the bulb has a severe 90% plus stenosis. Distal to this there is a U-shaped configuration of the right internal carotid artery. More distally the vessel is normal caliber The petrous the cavernous segments and supraclinoid segments are widely patent There is mild atherosclerotic irregularity involving the proximal cavernous, and distal cavernous portions of the right internal carotid artery. Right posterior communicating artery is seen opacifying the right posterior cerebral artery and the distal basilar artery. The right middle cerebral artery has approximately 30 40% stenosis of the right M1 segment The trifurcation branches appear to be widely patent into the capillary and venous phases The right anterior cerebral artery A1 segment and distally demonstrate wide patency is into the capillary and venous phases There is simultaneous cross-filling via the anterior communicating artery of the left anterior cerebral artery A2 segment and distally. Also demonstrated is opacification of the left anterior cerebral A1 segment, with flow noted into the left middle cerebral artery distribution. Unopacified blood is seen in the left middle cerebral artery from the collateral circulation. The left subclavian arteriogram demonstrates mild stenosis at the origin of the right  subclavian artery Mild atherosclerotic disease is noted of the subclavian artery and in the region of the thyrocervical trunk The dominant left vertebral artery origin is widely patent The vessel demonstrates mild tortuosity just distal to this. More distally the vessel is seen to opacify to the cranial skull base There is severe focal of focal stenosis of the left vertebrobasilar junction just distal to the origin of the left posterior-inferior cerebellar artery Additionally there is a approximately 70% stenosis of the proximal basilar artery More distally the basilar artery demonstrates mild caliber irregularity. There is a opacification of the left posterior cerebral artery the superior cerebellar arteries left greater than right, and the anterior-inferior cerebellar arteries Retrograde opacification of the right vertebrobasilar junction to the level of the right posterior-inferior cerebellar arteries is noted. Caliber irregularity of the right posterior-inferior cerebellar artery suggests intracranial arteriosclerosis. Left internal carotid artery occlusion or revascularization attempt The diagnostic JB 1 catheter left common carotid artery was then exchanged over a 0.035 inch 300 cm a 7 French Pinnacle sheath in the right groin. A diagnostic JB 1 catheter were then advanced over the rise exchange guidewire to the descending thoracic aorta. The 7 French Pinnacle sheath was then exchanged over a 0.035 inch 100 cm Amplatz stiff guidewire for a a 8 French 55 cm Brite tip neurovascular sheath using biplane roadmap technique and constant fluoroscopic guidance. Good good aspiration was obtained from the side port of the neurovascular sheath. This was then connected to continuous heparinized saline infusion. The embolus guidewire was then removed. Through the 8 French neurovascular sheath, over a 0.035 inch roadrunner guidewire, a 5 5 [place] JB 1 catheter was advanced to the recover region and select position in the  left common carotid artery. Using biplane technique and constant fluoroscopic guidance, over the of the 5 inch roadrunner guidewire, the 5 [place] JB 1 catheter was advanced to the left external carotid artery and exchanged over a 0.035 inch 300 cm rise exchange guidewire for an 8 French 85 cm flow gap balloon guide catheter which had been prepped with 50% contrast and 50% heparinized saline infusion The guidewire was removed. Good aspiration obtained from the hub of the 8 [place] Flo guide catheter. Gentle contrast injection demonstrated no evidence of spasms dissections or of intraluminal filling defects. At this time, in a coaxial manner and with constant heparinized saline infusion, a tree approached 1 microcatheter was advanced over a point 0.014 inch soft tip synchro micro guidewire to the distal end of the Flo guide catheter The Flood guide catheter was advanced to just inside the left internal carotid artery bulb. Thereafter, multiple attempts were made to advance the microcatheter over the micro guidewire first with Ace synchro soft and then a regular synchro guidewire. In 016 headliner double angled micro guidewire was also utilized in order to access the occluded left internal carotid artery. It was noted that there was a severe tortuosity at the junction of the proximal and the middle 1/3 of the left internal carotid artery Following multiple attempts it was decided to stop. A control was then performed through the [facility] guide catheter in the left common carotid artery which revealed brisk cross-filling of the left internal carotid artery distal cavernous and supraclinoid segments from the left external carotid artery branches via the ipsilateral ophthalmic artery Flow is noted into the left middle cerebral artery as well Also noted was a retrograde opacification from the left posterior communicating artery into the supraclinoid left ICA and then middle cerebral artery An earlier diagnostic catheter  arteriogram of the right common carotid artery he had revealed partial cross-filling via the anterior communicating artery from the right internal carotid artery Given the the collateral circulation, it was decided to stop the procedure. Throughout the procedure, the patient's blood pressure and neurological status remained stable No evidence of hemodynamic instability was noted No gross mass-effect or move filling defects or extravasation was seen Prior to the attempted revascularization of the left internal carotid artery occlusion, the patient was given 180 mg of Brilinta, and 81 mg of aspirin orally the an orogastric tube. Also the patient was loaded with 3000 units IV heparin in order to facilitate revascularization. The the 8 [place] flu guide catheter was then retrieved and removed. The 8 [place] Brite tip neurovascular sheath was then exchanged over a J-tip guidewire for a in 8 French 35 cm Brite tip neurovascular sheath. This was then connected to continuous heparinized saline infusion An arteriogram performed through this 3 5 cm 8 French Brite tip neurovascular sheath in the abdominal aorta reveal excellent flow through the common iliacs, the external and internal iliacs, and also the visualized portions of the common femoral arteries below the level of the inguinal ligaments. At the end of procedure, a CT of the brain revealed no evidence of gross intracranial hemorrhage,, or mass effect or midline shift. The patient was left intubated on account of his wound at the time of his intubation The right groin appeared soft without evidence of a hematoma. Distal pulses in the dorsalis pedis, and posterior regions remained dopplerable bilaterally. The patient was then transferred to the neuro ICU for further post stroke management. IMPRESSION: . Status post attempted endovascular revascularization of occluded left carotid artery  at the bulb. 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 Approximately 90% stenosis of the dominant left vertebrobasilar junction distal to the origin of the left posterior-inferior cerebellar artery, and of approximately 70% of the proximal basilar artery. 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 PLAN: Follow-up in the clinic 1 month post discharge Electronically Signed   By: Julieanne Cotton M.D.   On: 08/28/2018 16:08   Ir Angio Vertebral Sel Subclavian Innominate Uni L Mod Sed  Result Date: 08/31/2018 CLINICAL DATA:  New onset right-sided weakness with mild word-finding difficulties. 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 EXAM: IR ANGIO INTRA EXTRACRAN SEL COM CAROTID INNOMINATE UNI RIGHT MOD SED; IR PERCUTANEOUS ART THORMBECTOMY/INFUSION INTRACRANIAL INCLUDE DIAG ANGIO; IR ANGIO VERTEBRAL SEL SUBCLAVIAN INNOMINATE UNI LEFT MOD SED; IR CT HEAD LIMITED COMPARISON:  CT angiogram of the head and neck of 08/27/2018 MEDICATIONS: Heparin 3,000 units IV; . Ancef 2 g IV antibiotic was administered within 1 hour of the procedure ANESTHESIA/SEDATION: General anesthesia CONTRAST:  Isovue 300 approximately 100 mL FLUOROSCOPY TIME:  Fluoroscopy Time: 60 minutes 0 seconds (4327 mGy). COMPLICATIONS: None immediate. TECHNIQUE: Informed written consent was obtained from the patient after a thorough discussion of the procedural risks, benefits and alternatives. All questions were addressed. Maximal Sterile Barrier Technique was utilized including caps, mask, sterile gowns, sterile gloves, sterile drape, hand hygiene and skin antiseptic. A timeout was performed prior to the initiation of the procedure. The right groin was prepped and draped in the usual sterile fashion. Thereafter using modified Seldinger technique, transfemoral access into the right common femoral artery  was obtained without difficulty. Over a 0.035 inch guidewire, a 5 French Pinnacle sheath was inserted. Through this, and also over 0.035 inch guidewire, a 5 [place] JB 1 catheter was advanced to the aortic arch region and selectively positioned in the right common carotid artery, the innominate artery , the left common carotid artery and the left vertebral artery. FINDINGS: The left common carotid artery tear g demonstrates a moderate tortuosity of the proximal left common carotid artery The left external carotid artery proximally has a mild to moderate stenosis. Is branches are otherwise normally opacified The left internal carotid artery at the bulb would demonstrates the complete angiographic occlusion with no evidence of a string sign on the delayed arterial images. The innominate artery arteriogram demonstrates patency of the right subclavian artery and the right common carotid artery proximally There is no angiographic evidence of the right vertebral artery The right common carotid bifurcation demonstrates the right external carotid artery and its major branches to be widely patent The right internal carotid artery just distal to the bulb has a severe 90% plus stenosis. Distal to this there is a U-shaped configuration of the right internal carotid artery. More distally the vessel is normal caliber The petrous the cavernous segments and supraclinoid segments are widely patent There is mild atherosclerotic irregularity involving the proximal cavernous, and distal cavernous portions of the right internal carotid artery. Right posterior communicating artery is seen opacifying the right posterior cerebral artery and the distal basilar artery. The right middle cerebral artery has approximately 30 40% stenosis of the right M1 segment The trifurcation branches appear to be widely patent into the capillary and venous phases The right anterior cerebral artery A1 segment and distally demonstrate  wide patency is into the  capillary and venous phases There is simultaneous cross-filling via the anterior communicating artery of the left anterior cerebral artery A2 segment and distally. Also demonstrated is opacification of the left anterior cerebral A1 segment, with flow noted into the left middle cerebral artery distribution. Unopacified blood is seen in the left middle cerebral artery from the collateral circulation. 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 More distally the vessel is seen to opacify to the cranial skull base There is severe focal of focal stenosis of the left vertebrobasilar junction just distal to the origin of the left posterior-inferior cerebellar artery Additionally there is a approximately 70% stenosis of the proximal basilar artery More distally the basilar artery demonstrates mild caliber irregularity. There is a opacification of the left posterior cerebral artery the superior cerebellar arteries left greater than right, and the anterior-inferior cerebellar arteries Retrograde opacification of the right vertebrobasilar junction to the level of the right posterior-inferior cerebellar arteries is noted. Caliber irregularity of the right posterior-inferior cerebellar artery suggests intracranial arteriosclerosis. Left internal carotid artery occlusion or revascularization attempt The diagnostic JB 1 catheter left common carotid artery was then exchanged over a 0.035 inch 300 cm a 7 French Pinnacle sheath in the right groin. A diagnostic JB 1 catheter were then advanced over the rise exchange guidewire to the descending thoracic aorta. The 7 French Pinnacle sheath was then exchanged over a 0.035 inch 100 cm Amplatz stiff  guidewire for a a 8 French 55 cm Brite tip neurovascular sheath using biplane roadmap technique and constant fluoroscopic guidance. Good good aspiration was obtained from the side port of the neurovascular sheath. This was then connected to continuous heparinized saline infusion. The embolus guidewire was then removed. Through the 8 French neurovascular sheath, over a 0.035 inch roadrunner guidewire, a 5 5 [place] JB 1 catheter was advanced to the recover region and select position in the left common carotid artery. Using biplane technique and constant fluoroscopic guidance, over the of the 5 inch roadrunner guidewire, the 5 [place] JB 1 catheter was advanced to the left external carotid artery and exchanged over a 0.035 inch 300 cm rise exchange guidewire for an 8 French 85 cm flow gap balloon guide catheter which had been prepped with 50% contrast and 50% heparinized saline infusion The guidewire was removed. Good aspiration obtained from the hub of the 8 [place] Flo guide catheter. Gentle contrast injection demonstrated no evidence of spasms dissections or of intraluminal filling defects. At this time, in a coaxial manner and with constant heparinized saline infusion, a tree approached 1 microcatheter was advanced over a point 0.014 inch soft tip synchro micro guidewire to the distal end of the Flo guide catheter The Flood guide catheter was advanced to just inside the left internal carotid artery bulb. Thereafter, multiple attempts were made to advance the microcatheter over the micro guidewire first with Ace synchro soft and then a regular synchro guidewire. In 016 headliner double angled micro guidewire was also utilized in order to access the occluded left internal carotid artery. It was noted that there was a severe tortuosity at the junction of the proximal and the middle 1/3 of the left internal carotid artery Following multiple attempts it was decided to stop.  A control was then performed through the [facility]  guide catheter in the left common carotid artery which revealed brisk cross-filling of the left internal carotid artery distal cavernous and supraclinoid segments from the left external carotid artery branches via the ipsilateral ophthalmic artery Flow is noted into the left middle cerebral artery as well Also noted was a retrograde opacification from the left posterior communicating artery into the supraclinoid left ICA and then middle cerebral artery An earlier diagnostic catheter arteriogram of the right common carotid artery he had revealed partial cross-filling via the anterior communicating artery from the right internal carotid artery Given the the collateral circulation, it was decided to stop the procedure. Throughout the procedure, the patient's blood pressure and neurological status remained stable No evidence of hemodynamic instability was noted No gross mass-effect or move filling defects or extravasation was seen Prior to the attempted revascularization of the left internal carotid artery occlusion, the patient was given 180 mg of Brilinta, and 81 mg of aspirin orally the an orogastric tube. Also the patient was loaded with 3000 units IV heparin in order to facilitate revascularization. The the 8 [place] flu guide catheter was then retrieved and removed. The 8 [place] Brite tip neurovascular sheath was then exchanged over a J-tip guidewire for a in 8 French 35 cm Brite tip neurovascular sheath. This was then connected to continuous heparinized saline infusion An arteriogram performed through this 3 5 cm 8 French Brite tip neurovascular sheath in the abdominal aorta reveal excellent flow through the common iliacs, the external and internal iliacs, and also the visualized portions of the common femoral arteries below the level of the inguinal ligaments. At the end of procedure, a CT of the brain revealed no evidence of gross intracranial hemorrhage,, or mass effect or midline shift. The patient was left  intubated on account of his wound at the time of his intubation The right groin appeared soft without evidence of a hematoma. Distal pulses in the dorsalis pedis, and posterior regions remained dopplerable bilaterally. The patient was then transferred to the neuro ICU for further post stroke management. IMPRESSION: . 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 Approximately 90% stenosis of the dominant left vertebrobasilar junction distal to the origin of the left posterior-inferior cerebellar artery, and of approximately 70% of the proximal basilar artery. 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 PLAN: Follow-up in the clinic 1 month post discharge Electronically Signed   By: Julieanne Cotton M.D.   On: 08/28/2018 16:08   Vas [place] Lower Extremity Arterial Duplex  Result Date: 09/04/2018 LOWER EXTREMITY ARTERIAL DUPLEX STUDY  Current ABI: Not obtained Limitations: Cerebral angiogram 08/27/18, now with bleeding, pain, and bruising              at groin stick site. Performing Technologist: Gertie Fey MHA, RDMS, RVT, RDCS  Examination Guidelines: A complete evaluation includes B-mode imaging, spectral Doppler, color Doppler, and power Doppler as needed of all accessible portions of each vessel. Bilateral testing is considered an integral part of a complete examination. Limited examinations for reoccurring indications may be performed as noted.  Right Duplex Findings: +----------+--------+-----+--------+----------+--------+           PSV cm/sRatioStenosisWaveform  Comments +----------+--------+-----+--------+----------+--------+ CFA Prox  102                  monophasic         +----------+--------+-----+--------+----------+--------+  CFA Distal90                   monophasic          +----------+--------+-----+--------+----------+--------+ PTA Distal32                   monophasic         +----------+--------+-----+--------+----------+--------+ DP        44                   monophasic         +----------+--------+-----+--------+----------+--------+  Summary: Right: A right common femoral artery pseudoaneurysm was identified measuring 2.8 x 1.5 x 2.5 cm, with a neck measuring 1.3cm long x 0.6cm wide. Pseudoaneurysm measures 2.1cm from the surface of the skin. The proximal right common femoral artery exhibits a monophasic waveform, suggestive of possible aortoiliac obstruction. Distal posterior tibial artery exhibits monophasic flow, and the right dorsalis pedis artery exhibits monophasic flow, suggestive of proximal obstruction.  See table(s) above for measurements and observations.    Preliminary     PHYSICAL EXAM  Temp:  [97.6 F (36.4 C)-98.3 F (36.8 C)] 98.3 F (36.8 C) (01/09 1256) Pulse Rate:  [62-114] 98 (01/09 1256) Resp:  [18-22] 20 (01/09 1256) BP: (106-152)/(35-100) 146/61 (01/09 1256) SpO2:  [90 %-100 %] 98 % (01/09 1256) FiO2 (%):  [2 %] 2 % (01/09 1256)  General - Well nourished, well developed elderly Caucasian male, drowsy sleepy  Ophthalmologic - fundi not visualized due to noncooperation.  Cardiovascular - irregularly irregular heart rate and rhythm.  Abdomen - right lower abdominal wound stable  Neuro - drowsy but arousablet,  able to following simple commands. Able to repeat sentences and spontaneous speech but paucity of speech. PERRLA, EOMI, able to track objects on the both sides, inconsistent blinking to visual threat bilaterally.  Facial symmetric.  Tongue midline.  Left upper extremity 4/5.  Right lower extremity and right upper extremity flaccid.  Left lower extremity proximal 2+/5 and distal 3/5.  DTR 1+, Babinski negative.  Sensation symmetrical, coordination slow but intact on the left finger-to-nose and gait not  tested.   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  MRI head -left MCA and ACA territory scattered small infarcts  CTA H&N - left ICA, right VA occlusion.  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  2D Echo - EF 60 to 65%  LDL- 81  HgbA1c- 7.1  VTE prophylaxis -SCDs  aspirin 81 mg daily and Pradaxa (dabigatran) twice a dayprior to admission, now on Eliquis 5 mg twice daily  Ongoing aggressive stroke risk factor management  Therapy recommendations: SNF  Disposition: Pending  Right femoral A. 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 early on board  No intervention needed at this time  Continue dressing drainage  Pseudoaneurysm need to be addressed in the future with thrombin injection  Aspiration pneumonitis  Fever Tmax 101.2->101.3-> afebrile->101.3-> afebrile  Leukocytosis WBC 22.2-15.5-11.4-13.9->16.6-> 19.2->14.7->18.6  CXR CHF  Off lasix  Sputum culture normal  flora  Off Zosyn today  UA negative  Respiratory distress  Was intubated on ventilation  Self extubated on 09/01/18  Off sedation  So far on nasal cannula, tolerating  CXR pending  Atrial fibrillation, chronic  On Pradaxa PTA, compliant with medication  On metoprolol, rate controlled  On Eliquis and amiodarone po  Rate controlled  BP on the lower side - decrease metoprolol to 25mg  bid  Start gentle hydration - UOP decreased   Hypotension History of hypertension  BP stable   Off neo  On metoprolol - decreased dose to 25mg  bid  Avoid hypotension due to 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 as well as basal artery stenosis  BP goal 120-160  CHF  CXR showed CHF  Fluid overload - off lasix   Anasarca much improved  Cardiology signed off  CXR pending today  Gentle hydration IVF @ 30 given decreased UOP  Hyperlipidemia  Lipid lowering medication ZOX:WRUE  LDL81, goal < 70  Current lipid lowering medication:none (statin intolerant - abnormal labs)  Add zetia   Continue zetia on d/c  Diabetes  HgbA1c7.1, goal < 7.0  Uncontrolled  Hyperglycemia improved  SSI  CBG monitoring  On Levemir, continue  Intermittent agitation  Likely due to aphasia, right hemiparesis and discomfort position with obesity  Better with family in room  Decrease seroquel to 25mg  QHs due to drowsy in the am  Close monitoring  Other Stroke Risk Factors  Advanced age  Former cigarette smoker - quit  Obesity,Body mass index is 47.79 kg/m., recommend weight loss, diet and exercise as appropriate   Hx stroke/TIA  PVD  OSA - continue CPAP at night  Other Active Problems  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  Continue present treatment. Hope to transfer to  rehabilitation and medically stable over the next few days.discussed with wife and answered questions I spent  25 minutes in total face-to-face time with the patient, more than 50% of which was spent in counseling and coordination of care, reviewing test results, images and medication, and discussing the diagnosis of drowsy sleepy, low BP, SOB, decreased UOP, treatment plan and potential prognosis. This patient's care requiresreview of multiple databases, neurological assessment, discussion with family, other specialists and medical decision making of high complexity.  Delia Heady, MD Stroke Neurology 09/11/2018 2:13 PM   To contact Stroke Continuity provider, please refer to [email or web address]. After hours, contact General Neurology

## 2018-09-12 LAB — CBC
HCT: 30.6 % — ABNORMAL LOW (ref 39.0–52.0)
Hemoglobin: 9.9 g/dL — ABNORMAL LOW (ref 13.0–17.0)
MCH: 33.7 pg (ref 26.0–34.0)
MCHC: 32.4 g/dL (ref 30.0–36.0)
MCV: 104.1 fL — ABNORMAL HIGH (ref 80.0–100.0)
PLATELETS: 383 10*3/uL (ref 150–400)
RBC: 2.94 MIL/uL — ABNORMAL LOW (ref 4.22–5.81)
RDW: 16.5 % — ABNORMAL HIGH (ref 11.5–15.5)
WBC: 12.5 10*3/uL — ABNORMAL HIGH (ref 4.0–10.5)
nRBC: 0.2 % (ref 0.0–0.2)

## 2018-09-12 LAB — BASIC METABOLIC PANEL
Anion gap: 7 (ref 5–15)
BUN: 15 mg/dL (ref 8–23)
CO2: 28 mmol/L (ref 22–32)
Calcium: 9.2 mg/dL (ref 8.9–10.3)
Chloride: 104 mmol/L (ref 98–111)
Creatinine, Ser: 1.18 mg/dL (ref 0.61–1.24)
GFR calc Af Amer: >60 mL/min (ref >60)
GFR calc non Af Amer: 59 mL/min — ABNORMAL LOW (ref >60)
Glucose, Bld: 158 mg/dL — ABNORMAL HIGH (ref 70–99)
POTASSIUM: 5.1 mmol/L (ref 3.5–5.1)
Sodium: 139 mmol/L (ref 135–145)

## 2018-09-12 LAB — GLUCOSE, CAPILLARY
Glucose-Capillary: 131 mg/dL — ABNORMAL HIGH (ref 70–99)
Glucose-Capillary: 84 mg/dL (ref 70–99)
Glucose-Capillary: 160 mg/dL — ABNORMAL HIGH (ref 70–99)
Glucose-Capillary: 121 mg/dL — ABNORMAL HIGH (ref 70–99)
Glucose-Capillary: 182 mg/dL — ABNORMAL HIGH (ref 70–99)

## 2018-09-12 NOTE — Progress Notes (Signed)
LATE NOTE SUBMISSION   CSW spoke with patient's wife over the phone. Patient's wife indicated that she had toured [facility] and would like to choose that facility. CSW alerted [facility] and they have initiated insurance authorization.  CSW to continue to follow.  Blenda Nicely, [place] Clinical Social Worker [phone]

## 2018-09-12 NOTE — Progress Notes (Signed)
Speech-language-Cognitive Assessment   09/03/18 1100  SLP Visit Information  SLP Received On 09/03/18  Reason for Co-Treatment Complexity of the patient's impairments (multi-system involvement);Necessary to address cognition/behavior during functional activity;For patient/therapist safety;To address functional/ADL transfers  PT goals addressed during session Mobility/safety with mobility;Balance;Strengthening/ROM  OT goals addressed during session ADL's and self-care;Strengthening/ROM  SLP Time Calculation  SLP Start Time (ACUTE ONLY) 1030  SLP Stop Time (ACUTE ONLY) 1057  SLP Time Calculation (min) (ACUTE ONLY) 27 min  General Information  HPI 78 yo [facility] with Rt leg numbness and weakness.  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  [facility]  He was intubated for neuro-IR intervention 12/25, self-extubated 12/30. MRI head left MCA/ACA territory scattered small acute infarcts; multiple scattered chronic underlying cortical infarcts involving the bilateral cerebral and right cerebellar hemispheres; moderately advanced cerebral atrophy with chronic small vessel.   Prior Functional Status  Cognitive/Linguistic Baseline WFL  Type of Home Other(Comment) (Condo)  Available Help at Discharge Family;Available 24 hours/day  Pain Assessment  Pain Assessment No/denies pain  Oral Motor/Sensory Function  Overall Oral Motor/Sensory Function WFL  Cognition  Overall Cognitive Status Impaired/Different from baseline  Arousal/Alertness Awake/alert  Orientation Level Oriented to person;Disoriented to time;Disoriented to place;Disoriented to situation  Attention Focused;Sustained  Focused Attention Appears intact  Sustained Attention Impaired  Sustained Attention Impairment Verbal basic;Functional basic  Awareness Impaired  Problem Solving Impaired  Problem Solving Impairment Functional basic;Verbal basic  Executive Function Sequencing;Initiating  Sequencing Impaired   Sequencing Impairment Functional basic  Initiating Impaired  Initiating Impairment Verbal basic;Functional basic  Safety/Judgment Impaired  Auditory Comprehension  Overall Auditory Comprehension Impaired  Yes/No Questions WFL  Commands X  One Step Basic Commands 75-100% accurate (accurate with face, needs cues with LUE)  Two Step Basic Commands 0-24% accurate  Multistep Basic Commands 0-24% accurate  Interfering Components Attention  Reading Comprehension  Reading Status Not tested  Verbal Expression  Overall Verbal Expression Impaired  Initiation Impaired  Automatic Speech Name;Social Response  Level of Generative/Spontaneous Verbalization Word  Repetition No impairment  Naming Impairment  Responsive 26-50% accurate  Confrontation Impaired  Convergent Not tested  Divergent Not tested  Verbal Errors Phonemic paraphasias;Neologisms;Aware of errors  Pragmatics No impairment  Interfering Components Attention  Written Expression  Dominant Hand Right  Motor Speech  Overall Motor Speech Appears within functional limits for tasks assessed  Assessment  Clinical Impression Statement (ACUTE ONLY) Pt demonstrates moderate receptive and expressive language impairment with possible cognitive impariment as well. He is alert, does not initiate communication without cues. Needs auditory/contextual cues to direct attention to right visual field. Can follow oral motor commands briskly and accurately, but cannot follow 2 step commands. Can name objects fairly well, but loses attention with more complex language tasks with both decreased grammatical output and paraphasias. There seems to be some difficulty initiating use of LUE despite strength. Pt would benefit from f/u at CIR. Will continue to facilitate cognitive linguistic improvement acutely.   SLP Recommendation/Assessment Patient needs continued Speech Lanaguage Pathology Services  SLP Visit Diagnosis Aphasia (R47.01)  Plan  Speech Therapy  Frequency (ACUTE ONLY) min 2x/week  Duration 2 weeks  Treatment/Interventions Language facilitation;Functional tasks;SLP instruction and feedback;Compensatory strategies;Patient/family education  Potential to Achieve Goals (ACUTE ONLY) Good  Individuals Consulted  Consulted and Agree with Results and Recommendations Patient;Family member/caregiver  Family Member Consulted  wife  SLP Evaluations  $ SLP Speech Visit 1 Visit  SLP Evaluations  $ SLP EVAL LANGUAGE/SOUND  PRODUCTION 1 Procedure  $Speech Treatment for Individual 1 Procedure

## 2018-09-12 NOTE — Progress Notes (Signed)
Occupational Therapy Treatment Patient Details Name: Darren Frazier MRN: [id number] DOB: 03-Feb-1941 Today's Date: 09/12/2018    History of present illness 78 yo male  PMH including Cardiomyopathy (HCC), DM, Dysrhythmia, AAA, Hyperlipidemia, HTN, Hypothyroidism, Morbid obesity PVD, Stroke (2011), Tremors of nervous system. Presented to [facility] 12/25 with Rt leg numbness and weakness.  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  He was transferred to [facility].  He was intubated for neuro-IR intervention 12/25, self-extubated 12/30. MRI head left MCA/ACA territory scattered small acute infarcts; multiple scattered chronic underlying cortical infarcts involving the bilateral cerebral and right cerebellar hemispheres; moderately advanced cerebral atrophy with chronic small vessel.    OT comments  Pt tolerated EOB sitting with cues for balance and core activation and progressed to bari stedy for > 3 minutes. Pt fatigued at the end of session. Pt lacks awareness to L visual field and needs cues and R visual field positioning.   Follow Up Recommendations  SNF;Supervision/Assistance - 24 hour    Equipment Recommendations  Hospital bed;Wheelchair cushion (measurements OT);Wheelchair (measurements OT);Other (comment)(lift)    Recommendations for Other Services      Precautions / Restrictions Precautions Precautions: Fall       Mobility Bed Mobility Overal bed mobility: Needs Assistance Bed Mobility: Sit to Supine Rolling: Max assist;+2 for physical assistance;+2 for safety/equipment     Sit to supine: Total assist;+2 for physical assistance;+2 for safety/equipment(+3 )   General bed mobility comments: pt progressed to side lying on R side then rolled for linen placement. pt then positioned upright in chair position int he bed  Transfers Overall transfer level: Needs assistance   Transfers: Sit to/from Stand Sit to Stand: +2 physical assistance;Max assist;From elevated surface         General  transfer comment: pt able to activate sit<stand and needs cues to sustain attention to task. pt needs cues to distract while upright due to request to return to EOB sitting    Balance   Sitting-balance support: Single extremity supported;Feet supported Sitting balance-Leahy Scale: Poor Sitting balance - Comments: R lateral lean and able to hold with L UE on bed rail to help activate L lean                                   ADL either performed or assessed with clinical judgement   ADL     Eating/Feeding Details (indicate cue type and reason): noted to have oral secretions R side of mouth and does not attempt to wipe off  Grooming: Maximal assistance;Sitting Grooming Details (indicate cue type and reason): unsupported sitting unable to complete. with supported sitting falling to the right and decr attention to task  Upper Body Bathing: Maximal assistance   Lower Body Bathing: Total assistance   Upper Body Dressing : Maximal assistance   Lower Body Dressing: Total assistance Lower Body Dressing Details (indicate cue type and reason): Pt incontinent of bowel at this time and lacks awareness               General ADL Comments: pt able to achieve sit <>stand in stedy after x2 attempts to come to standing with elevated bed surface. pt with hip abduction and requires a few attempts to correctly position LB for attempts to achieve standing. pt is not able to adduct knees with cues. pt with bari stedy bar down to allow for semi stand position. pt only able to correct visually scan  1 out 3 trials for objects in L visual field.     Vision   Additional Comments: pt with visual deficits. pt requires hand placed in R visual field to recognize yellow arm band   Perception     Praxis      Cognition Arousal/Alertness: Awake/alert Behavior During Therapy: Flat affect;Impulsive Overall Cognitive Status: Impaired/Different from baseline Area of Impairment:  Orientation;Attention;Memory;Following commands;Safety/judgement;Awareness;Problem solving                 Orientation Level: Disoriented to;Place;Situation(reports [facility] in [place] unaware of reason for admit) Current Attention Level: Sustained Memory: Decreased recall of precautions;Decreased short-term memory Following Commands: Follows one step commands with increased time;Follows one step commands inconsistently Safety/Judgement: Decreased awareness of safety;Decreased awareness of deficits Awareness: Intellectual Problem Solving: Slow processing;Decreased initiation;Difficulty sequencing;Requires verbal cues;Requires tactile cues General Comments: pt able to sustain attention for ~ 15 seconds then needs repeative cues or tactile cues. pt lacks awareness to visual deficits        Exercises     Shoulder Instructions       General Comments oxygen [place] , VSS    Pertinent Vitals/ Pain       Pain Assessment: No/denies pain  Home Living                                          Prior Functioning/Environment              Frequency  Min 2X/week        Progress Toward Goals  OT Goals(current goals can now be found in the care plan section)  Progress towards OT goals: Progressing toward goals  Acute Rehab OT Goals Patient Stated Goal: none stated OT Goal Formulation: Patient unable to participate in goal setting ADL Goals Pt Will Perform Eating: with min assist;sitting Pt Will Perform Grooming: with min assist;with caregiver independent in assisting Additional ADL Goal #1: Pt will perform bed mobility at max A prior to engaging in ADL activity Additional ADL Goal #2: Pt will sit EOB as activity tolerance precursor for ADL participation at min A for at least 5 min  Plan Discharge plan remains appropriate    Co-evaluation    PT/OT/SLP Co-Evaluation/Treatment: Yes Reason for Co-Treatment: Complexity of the patient's impairments  (multi-system involvement);Necessary to address cognition/behavior during functional activity;For patient/therapist safety;To address functional/ADL transfers   OT goals addressed during session: ADL's and self-care;Proper use of Adaptive equipment and DME;Strengthening/ROM      AM-PAC OT "6 Clicks" Daily Activity     Outcome Measure   Help from another person eating meals?: A Lot Help from another person taking care of personal grooming?: A Lot Help from another person toileting, which includes using toliet, bedpan, or urinal?: Total Help from another person bathing (including washing, rinsing, drying)?: A Lot Help from another person to put on and taking off regular upper body clothing?: A Lot Help from another person to put on and taking off regular lower body clothing?: Total 6 Click Score: 10    End of Session Equipment Utilized During Treatment: Oxygen;Gait belt  OT Visit Diagnosis: Unsteadiness on feet (R26.81);Other abnormalities of gait and mobility (R26.89);Muscle weakness (generalized) (M62.81);Other symptoms and signs involving the nervous system (R29.898);Other symptoms and signs involving cognitive function;Hemiplegia and hemiparesis Hemiplegia - Right/Left: Right Hemiplegia - dominant/non-dominant: Dominant Hemiplegia - caused by: Cerebral infarction   Activity Tolerance Patient tolerated  treatment well   Patient Left in bed;with call bell/phone within reach;with bed alarm set(pt in chair position. tech entering room so mitten not place)   Nurse Communication Mobility status;Precautions        Time: 1110(1110)-1140 OT Time Calculation (min): 30 min  Charges: OT General Charges $OT Visit: 1 Visit OT Treatments $Self Care/Home Management : 8-22 mins   Mateo Flow, OTR/L  Acute Rehabilitation Services Pager: [phone] Office: [phone] .    Mateo Flow 09/12/2018, 12:01 PM

## 2018-09-12 NOTE — Progress Notes (Signed)
  Speech Language Pathology Treatment: Cognitive-Linquistic  Patient Details Name: Darren Frazier MRN: [id number] DOB: Jan 08, 1941 Today's Date: 09/12/2018 Time: 6789-3810 SLP Time Calculation (min) (ACUTE ONLY): 16 min  Assessment / Plan / Recommendation Clinical Impression  Pt seen while assisting with pt care (part of session). Prior to entering pt attempting to take off gown. He required max cues to orient to place; used spaced retrieval to recall "hospital" throughout session with 3-5 minute intervals without success. (+) accurate for month, (-) year, (-) situation. Max problem solving cues for basic situations such as using call bell. He brushed teeth with mild tactile assist. Signs of aphasia were not as apparent to therapist (have not treated pt previously); seemed more cognitive based today- will continue to treat.    HPI HPI: 78 yo male presented to [facility] 12/25 with Rt leg numbness and weakness.  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  He was transferred to [facility].  He was intubated for neuro-IR intervention 12/25, self-extubated 12/30. MRI head left MCA/ACA territory scattered small acute infarcts; multiple scattered chronic underlying cortical infarcts involving the bilateral cerebral and right cerebellar hemispheres; moderately advanced cerebral atrophy with chronic small vessel.       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

## 2018-09-12 NOTE — Progress Notes (Addendum)
STROKE TEAM PROGRESS NOTE   SUBJECTIVE (INTERVAL HISTORY) No family at bedside. Patient in bed awake, alert on 2L [place] 02. SATs WNL. Waiting on insurance approval for SNF.  OBJECTIVE Temp:  [97.7 F (36.5 C)-98.3 F (36.8 C)] 98.3 F (36.8 C) (01/10 1148) Pulse Rate:  [74-113] 91 (01/10 1148) Cardiac Rhythm: Atrial fibrillation (01/10 0800) Resp:  [18-23] 18 (01/10 1148) BP: (126-147)/(61-94) 141/78 (01/10 1148) SpO2:  [91 %-100 %] 99 % (01/10 1148)  Recent Labs  Lab 09/11/18 1636 09/11/18 2122 09/12/18 0624 09/12/18 0646 09/12/18 1146  GLUCAP 132* 137* 131* 84 160*   Recent Labs  Lab 09/07/18 0907 09/08/18 1247 09/09/18 0620 09/10/18 0452 09/11/18 0625  NA 144 141 142 141 141  K 5.3* 4.0 4.4 4.2 4.5  CL 109 103 105 106 106  CO2 23 27 26 24 26   GLUCOSE 163* 152* 162* 150* 193*  BUN 19 18 17 14 16   CREATININE 1.23 1.33* 1.29* 1.25* 1.36*  CALCIUM 9.4 8.9 9.2 9.0 8.7*   No results for input(s): AST, ALT, ALKPHOS, BILITOT, PROT, ALBUMIN in the last 168 hours. Recent Labs  Lab 09/08/18 1247 09/09/18 0620 09/10/18 0452 09/11/18 0625 09/12/18 0500  WBC 12.1* 12.2* 13.3* 13.4* 12.5*  HGB 10.0* 11.6* 10.4* 10.3* 9.9*  HCT 31.5* 36.4* 33.4* 33.6* 30.6*  MCV 102.6* 107.4* 105.0* 105.7* 104.1*  PLT 495* 369 501* 516* 383   No results for input(s): CKTOTAL, CKMB, CKMBINDEX, TROPONINI in the last 168 hours. No results for input(s): LABPROT, INR in the last 72 hours. No results for input(s): COLORURINE, LABSPEC, PHURINE, GLUCOSEU, HGBUR, BILIRUBINUR, KETONESUR, PROTEINUR, UROBILINOGEN, NITRITE, LEUKOCYTESUR in the last 72 hours.  Invalid input(s): APPERANCEUR     Component Value Date/Time   CHOL 138 08/28/2018 0600   TRIG 171 (H) 08/28/2018 0921   HDL 24 (L) 08/28/2018 0600   CHOLHDL 5.8 08/28/2018 0600   VLDL 33 08/28/2018 0600   LDLCALC 81 08/28/2018 0600   Lab Results  Component Value Date   HGBA1C 7.1 (H) 08/28/2018   No results found for: LABOPIA,  COCAINSCRNUR, LABBENZ, AMPHETMU, THCU, LABBARB  No results for input(s): ETH in the last 168 hours.  I have personally reviewed the radiological images below and agree with the radiology interpretations.  Ct Angio Head W Or Wo Contrast  Result Date: 08/27/2018 CLINICAL DATA:  Acute presentation with speech disturbance. EXAM: CT ANGIOGRAPHY HEAD AND NECK TECHNIQUE: Multidetector CT imaging of the head and neck was performed using the standard protocol during bolus administration of intravenous contrast. Multiplanar CT image reconstructions and MIPs were obtained to evaluate the vascular anatomy. Carotid stenosis measurements (when applicable) are obtained utilizing NASCET criteria, using the distal internal carotid diameter as the denominator. CONTRAST:  75mL ISOVUE-370 IOPAMIDOL (ISOVUE-370) INJECTION 76% COMPARISON:  CT same day. FINDINGS: CTA NECK FINDINGS Aortic arch: Aortic atherosclerosis.  No aneurysm or dissection. Right carotid system: Common carotid artery shows some atherosclerotic plaque but is widely patent to the bifurcation. There is soft and calcified plaque affecting the carotid bifurcation and ICA bulb region. There is severe stenosis in the distal bulb region with luminal diameter of 1 mm. The vessel is quite tortuous beyond that but widely patent to the upper cervical region, where there is soft and calcified plaque resulting in minimal diameter of 3.5 mm. Compared to an expected diameter of 5 mm, stenosis in the bulb is 80% or greater. Stenosis in the upper cervical region is 30%. Left carotid system: Common carotid artery shows atherosclerotic  plaque but is sufficiently patent to the bifurcation region. There is calcified and soft plaque at the carotid bifurcation and ICA bulb. There is left internal carotid artery occlusion at the distal bulb. 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 The left vertebral artery shows 50% stenosis at its origin but is  sufficiently patent beyond that through the cervical region to the foramen magnum. Skeleton: Mid cervical spondylosis. Other neck: No mass or lymphadenopathy. Upper chest: Interstitial prominence which could go along with fluid overload or mild edema. Review of the MIP images confirms the above findings CTA HEAD FINDINGS Anterior circulation: Left internal carotid artery is occluded without antegrade flow through the skull base. Right internal carotid artery shows atherosclerotic disease in the carotid siphon region with stenosis estimated at 50-70%. The anterior and middle cerebral vessels are patent. There is atherosclerotic irregularity of the M1 segment with stenosis of 50-70%. No acute vessel occlusion is identified. There is a chronic punctate calcified embolus in 1 of the insular branches, but this was present in 2014. On the left, there is reconstituted flow in the distal siphon. Severe stenosis of the supraclinoid ICA, 80% or greater. Flow is present in the anterior and middle cerebral vessels, secondary to reconstituted flow and flow through communicating arteries. There is 50% stenosis in the M1 segment. I do not see any occluded or missing large or medium vessels in the MCA territory. Posterior circulation: Right vertebral artery shows no antegrade flow at the foramen magnum. There is retrograde flow in the distal right vertebral. Left vertebral artery is patent at the foramen magnum. There is atherosclerotic disease of the V4 segment with stenosis estimated at 70%. There are serial stenoses. The vessel does show flow to the basilar, which shows atherosclerotic irregularity but is patent. Flow is present in the superior cerebellar and posterior cerebral arteries. Venous sinuses: Patent and normal. Anatomic variants: None significant. Delayed phase: No abnormal enhancement. 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 50% stenosis of the left vertebral artery origin. Severe atherosclerotic disease in both carotid siphon regions. Right siphon stenosis estimated at 50-70%. Reconstituted flow in the distal left siphon and supraclinoid region. 80% supraclinoid stenosis. Flow in both anterior and middle cerebral artery territories. Stenosis of both M1 segments estimated at 50-70%. I do not identify any large or medium vessel occlusion acutely within the MCA branch vessels. Severely disease left V4 segment with serial stenoses proximal to the basilar estimated at 70%. Basilar atherosclerotic irregularity without flow limiting stenosis. Posterior circulation branch vessels do show flow, including right PICA which is supplied by retrograde flow in the distal right vertebral artery. Electronically Signed   By: Paulina Fusi M.D.   On: 08/27/2018 13:14   Dg Chest 2 View  Result Date: 08/27/2018 CLINICAL DATA:  Chest pain and shortness of breath.  Cardiomyopathy. EXAM: CHEST - 2 VIEW COMPARISON:  03/07/2013 FINDINGS: Mild cardiomegaly. Aortic atherosclerosis. Diffuse interstitial infiltrates, consistent with pulmonary edema. Mild subsegmental atelectasis also seen in the left upper lobe. No evidence of pulmonary consolidation or significant pleural effusion. IMPRESSION: Mild cardiomegaly and diffuse interstitial edema, consistent with congestive heart failure. Electronically Signed   By: Myles Rosenthal M.D.   On: 08/27/2018 12:02   Ct Angio  Neck W Or Wo Contrast  Result Date: 08/27/2018 CLINICAL DATA:  Acute presentation with speech disturbance. EXAM: CT ANGIOGRAPHY HEAD AND NECK TECHNIQUE: Multidetector CT imaging of the head and neck was performed using the standard protocol during bolus administration of intravenous contrast. Multiplanar CT image  reconstructions and MIPs were obtained to evaluate the vascular anatomy. Carotid stenosis measurements (when applicable) are obtained utilizing NASCET criteria, using the distal internal carotid diameter as the denominator. CONTRAST:  82mL ISOVUE-370 IOPAMIDOL (ISOVUE-370) INJECTION 76% COMPARISON:  CT same day. FINDINGS: CTA NECK FINDINGS Aortic arch: Aortic atherosclerosis.  No aneurysm or dissection. Right carotid system: Common carotid artery shows some atherosclerotic plaque but is widely patent to the bifurcation. There is soft and calcified plaque affecting the carotid bifurcation and ICA bulb region. There is severe stenosis in the distal bulb region with luminal diameter of 1 mm. The vessel is quite tortuous beyond that but widely patent to the upper cervical region, where there is soft and calcified plaque resulting in minimal diameter of 3.5 mm. Compared to an expected diameter of 5 mm, stenosis in the bulb is 80% or greater. Stenosis in the upper cervical region is 30%. Left carotid system: Common carotid artery shows atherosclerotic plaque but is sufficiently patent to the bifurcation region. There is calcified and soft plaque at the carotid bifurcation and ICA bulb. There is left internal carotid artery occlusion at the distal bulb. 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 The left vertebral artery shows 50% stenosis at its origin but is sufficiently patent beyond that through the cervical region to the foramen magnum. Skeleton: Mid cervical spondylosis. Other neck: No mass or lymphadenopathy. Upper chest: Interstitial prominence which could go along with fluid overload or mild edema. Review of the MIP images confirms the above findings CTA HEAD FINDINGS Anterior circulation: Left internal carotid artery is occluded without antegrade flow through the skull base. Right internal carotid artery shows atherosclerotic disease in the carotid siphon region with stenosis  estimated at 50-70%. The anterior and middle cerebral vessels are patent. There is atherosclerotic irregularity of the M1 segment with stenosis of 50-70%. No acute vessel occlusion is identified. There is a chronic punctate calcified embolus in 1 of the insular branches, but this was present in 2014. On the left, there is reconstituted flow in the distal siphon. Severe stenosis of the supraclinoid ICA, 80% or greater. Flow is present in the anterior and middle cerebral vessels, secondary to reconstituted flow and flow through communicating arteries. There is 50% stenosis in the M1 segment. I do not see any occluded or missing large or medium vessels in the MCA territory. Posterior circulation: Right vertebral artery shows no antegrade flow at the foramen magnum. There is retrograde flow in the distal right vertebral. Left vertebral artery is patent at the foramen magnum. There is atherosclerotic disease of the V4 segment with stenosis estimated at 70%. There are serial stenoses. The vessel does show flow to the basilar, which shows atherosclerotic irregularity but is patent. Flow is present in the superior cerebellar and posterior cerebral arteries. Venous sinuses: Patent and normal. Anatomic variants: None significant. Delayed phase: No abnormal enhancement. 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 Severe irregular atherosclerotic disease of the right carotid bifurcation with stenosis of 80% in the bulb region and 30% in the distal ICA. 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 50% stenosis of the left vertebral artery  origin. Severe atherosclerotic disease in both carotid siphon regions. Right siphon stenosis estimated at 50-70%. Reconstituted flow in the distal left siphon and supraclinoid region. 80% supraclinoid stenosis. Flow in both anterior and middle cerebral artery territories. Stenosis of both M1 segments estimated  at 50-70%. I do not identify any large or medium vessel occlusion acutely within the MCA branch vessels. Severely disease left V4 segment with serial stenoses proximal to the basilar estimated at 70%. Basilar atherosclerotic irregularity without flow limiting stenosis. Posterior circulation branch vessels do show flow, including right PICA which is supplied by retrograde flow in the distal right vertebral artery. Electronically Signed   By: Paulina Fusi M.D.   On: 08/27/2018 13:14   Mr Brain Wo Contrast  Result Date: 08/28/2018 CLINICAL DATA:  Follow-up examination for acute stroke, right lower extremity weakness. Known left ICA occlusion. EXAM: MRI HEAD WITHOUT CONTRAST TECHNIQUE: Multiplanar, multiecho pulse sequences of the brain and surrounding structures were obtained without intravenous contrast. COMPARISON:  Prior CTs from 08/27/2018. FINDINGS: Brain: Generalized age-related cerebral atrophy. Patchy and confluent T2/FLAIR hyperintensity within the periventricular and deep white matter both cerebral hemispheres most consistent with chronic microvascular ischemic disease, moderate nature. Scatter remote cortical infarcts involving the posterior left frontoparietal region, right parietal lobe, and right frontal lobe are seen. Small remote right cerebellar infarct. Remote lacunar infarct present at the left caudate head. Scattered chronic hemosiderin staining seen about several of these infarcts. Scattered multifocal foci of restricted diffusion involving the cortical subcortical left frontal, parietal, and temporal occipital regions, compatible with acute ischemic left MCA territory infarcts. Largest area of infarction seen at the high left frontal parietal region and measures 2.5 cm (series 3, image 48). Possible faint petechial hemorrhage about a few of these infarcts without frank hemorrhagic transformation or significant mass effect. Findings likely embolic in nature. No mass lesion, midline shift or  mass effect. Mild diffuse ventricular prominence related global parenchymal volume loss without hydrocephalus. No extra-axial fluid collection. Pituitary gland within normal limits. Vascular: Abnormal flow void within the left ICA to its cavernous segment, compatible with previously identified left ICA occlusion. Major intracranial vascular flow voids otherwise maintained at the skull base. Skull and upper cervical spine: Craniocervical junction within normal limits. Upper cervical spine normal. Bone marrow signal intensity within normal limits. No scalp soft tissue abnormality. Sinuses/Orbits: Patient status post bilateral ocular lens replacement. Globes and orbital soft tissues demonstrate no acute finding. Scattered mucosal thickening seen throughout the paranasal sinuses with superimposed scattered air-fluid levels. Fluid seen within the nasopharynx. Patient likely intubated. Trace bilateral mastoid effusions noted. Inner ear structures normal. Other: None. IMPRESSION: 1. 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 Possible faint scattered associated petechial hemorrhage without frank hemorrhagic transformation or significant mass effect. 2. Abnormal flow void within the left ICA to its cavernous segment, consistent with known left ICA occlusion. 3. 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 4. Moderately advanced cerebral atrophy with chronic small vessel ischemic disease. Electronically Signed   By: Rise Mu M.D.   On: 08/28/2018 15:33   Ct Abdomen Pelvis W Contrast  Result Date: 08/29/2018 CLINICAL DATA:  Abdominal distention.  Bloating. EXAM: CT ABDOMEN AND PELVIS WITH CONTRAST TECHNIQUE: Multidetector CT imaging of the abdomen and pelvis was performed using the standard protocol following bolus administration of intravenous contrast. CONTRAST:   OMNIPAQUE IOHEXOL 300 MG/ML  SOLN COMPARISON:  Abdominal radiograph dated 08/28/2018 and abdominal  ultrasound dated 02/28/2016 FINDINGS: Lower chest: There are bilateral consolidative infiltrates in the lower lobes as well as minimal bilateral pleural effusions. Aortic atherosclerosis. Coronary artery calcifications. Hepatobiliary: Liver parenchyma is normal. No biliary ductal dilatation. Vicarious excretion of contrast in the normal appearing gallbladder. Pancreas: Unremarkable. No pancreatic ductal dilatation or surrounding inflammatory changes. Spleen: Normal in size without focal abnormality. Adrenals/Urinary Tract: Adrenal glands are normal. Normal right kidney. Large chronic cyst in the lower pole of the left kidney with some calcification in the wall. Maximum diameter of the cyst is approximately 9 cm. Stomach/Bowel: NG tube tip in the body of the stomach. There are few diverticula in the proximal sigmoid portion of the colon. There is moderate air in the colon without significant distention. Small bowel appears normal. Fluid and air in the stomach. Appendix has been removed. Vascular/Lymphatic: There is a poorly defined hematoma in the right inguinal region in the panniculus. This measures approximately 12 x 5 x 2 cm and is probably related to recent angiography. There are several small lymph nodes in the right inguinal region, the largest being 15 mm in diameter. Extensive aortic atherosclerosis. No adenopathy. Reproductive: Dense calcification in the normal-sized prostate gland. Other: There is a small amount of ascites in the abdomen, nonspecific. Small periumbilical hernia containing only fat. Edema in the subcutaneous fat of both flanks, right greater than left. Slight edema in the panniculus and in the lateral aspect of both thighs. Musculoskeletal: No acute abnormalities. Multilevel degenerative disc disease in the thoracic spine and lumbar spine. IMPRESSION: 1. Small hematoma in the right inguinal  region in the panniculus, probably secondary to the recent angiography. 2. Bilateral consolidative infiltrates in the posterior aspect of both lower lobes with tiny adjacent pleural effusions. 3. Small amount of nonspecific ascites. 4. Slightly prominent gas in the bowel without evidence of bowel obstruction. This probably represents an ileus. Electronically Signed   By: Francene Boyers M.D.   On: 08/29/2018 17:03   Ir Ct Head Ltd  Result Date: 08/31/2018 CLINICAL DATA:  New onset right-sided weakness with mild word-finding difficulties. 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 EXAM: IR ANGIO INTRA EXTRACRAN SEL COM CAROTID INNOMINATE UNI RIGHT MOD SED; IR PERCUTANEOUS ART THORMBECTOMY/INFUSION INTRACRANIAL INCLUDE DIAG ANGIO; IR ANGIO VERTEBRAL SEL SUBCLAVIAN INNOMINATE UNI LEFT MOD SED; IR CT HEAD LIMITED COMPARISON:  CT angiogram of the head and neck of 08/27/2018 MEDICATIONS: Heparin 3,000 units IV; . Ancef 2 g IV antibiotic was administered within 1 hour of the procedure ANESTHESIA/SEDATION: General anesthesia CONTRAST:  Isovue 300 approximately 100 mL FLUOROSCOPY TIME:  Fluoroscopy Time: 60 minutes 0 seconds (4327 mGy). COMPLICATIONS: None immediate. TECHNIQUE: Informed written consent was obtained from the patient after a thorough discussion of the procedural risks, benefits and alternatives. All questions were addressed. Maximal Sterile Barrier Technique was utilized including caps, mask, sterile gowns, sterile gloves, sterile drape, hand hygiene and skin antiseptic. A timeout was performed prior to the initiation of the procedure. The right groin was prepped and draped in the usual sterile fashion. Thereafter using modified Seldinger technique, transfemoral access into the right common femoral artery was obtained without difficulty. Over a  0.035 inch guidewire, a 5 French Pinnacle sheath was inserted. Through this, and also over 0.035 inch guidewire, a 5 [place] JB 1 catheter was advanced to the aortic arch region and  selectively positioned in the right common carotid artery, the innominate artery , the left common carotid artery and the left vertebral artery. FINDINGS: The left common carotid artery tear g demonstrates a moderate tortuosity of the proximal left common carotid artery The left external carotid artery proximally has a mild to moderate stenosis. Is branches are otherwise normally opacified The left internal carotid artery at the bulb would demonstrates the complete angiographic occlusion with no evidence of a string sign on the delayed arterial images. The innominate artery arteriogram demonstrates patency of the right subclavian artery and the right common carotid artery proximally There is no angiographic evidence of the right vertebral artery The right common carotid bifurcation demonstrates the right external carotid artery and its major branches to be widely patent The right internal carotid artery just distal to the bulb has a severe 90% plus stenosis. Distal to this there is a U-shaped configuration of the right internal carotid artery. More distally the vessel is normal caliber The petrous the cavernous segments and supraclinoid segments are widely patent There is mild atherosclerotic irregularity involving the proximal cavernous, and distal cavernous portions of the right internal carotid artery. Right posterior communicating artery is seen opacifying the right posterior cerebral artery and the distal basilar artery. The right middle cerebral artery has approximately 30 40% stenosis of the right M1 segment The trifurcation branches appear to be widely patent into the capillary and venous phases The right anterior cerebral artery A1 segment and distally demonstrate wide patency is into the capillary and venous phases There is  simultaneous cross-filling via the anterior communicating artery of the left anterior cerebral artery A2 segment and distally. Also demonstrated is opacification of the left anterior cerebral A1 segment, with flow noted into the left middle cerebral artery distribution. Unopacified blood is seen in the left middle cerebral artery from the collateral circulation. 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 More distally the vessel is seen to opacify to the cranial skull base There is severe focal of focal stenosis of the left vertebrobasilar junction just distal to the origin of the left posterior-inferior cerebellar artery Additionally there is a approximately 70% stenosis of the proximal basilar artery More distally the basilar artery demonstrates mild caliber irregularity. There is a opacification of the left posterior cerebral artery the superior cerebellar arteries left greater than right, and the anterior-inferior cerebellar arteries Retrograde opacification of the right vertebrobasilar junction to the level of the right posterior-inferior cerebellar arteries is noted. Caliber irregularity of the right posterior-inferior cerebellar artery suggests intracranial arteriosclerosis. Left internal carotid artery occlusion or revascularization attempt The diagnostic JB 1 catheter left common carotid artery was then exchanged over a 0.035 inch 300 cm a 7 French Pinnacle sheath in the right groin. A diagnostic JB 1 catheter were then advanced over the rise exchange guidewire to the descending thoracic aorta. The 7 French Pinnacle sheath was then exchanged over a 0.035 inch 100 cm Amplatz stiff guidewire for a a 8 French 55 cm Brite tip  neurovascular sheath using biplane roadmap technique and constant fluoroscopic guidance. Good good aspiration was obtained from the side port of the neurovascular sheath. This was then connected to continuous heparinized saline infusion. The embolus guidewire was then removed. Through the 8 [place]  neurovascular sheath, over a 0.035 inch roadrunner guidewire, a 5 5 [place] JB 1 catheter was advanced to the recover region and select position in the left common carotid artery. Using biplane technique and constant fluoroscopic guidance, over the of the 5 inch roadrunner guidewire, the 5 [place] JB 1 catheter was advanced to the left external carotid artery and exchanged over a 0.035 inch 300 cm rise exchange guidewire for an 8 French 85 cm flow gap balloon guide catheter which had been prepped with 50% contrast and 50% heparinized saline infusion The guidewire was removed. Good aspiration obtained from the hub of the 8 [place] Flo guide catheter. Gentle contrast injection demonstrated no evidence of spasms dissections or of intraluminal filling defects. At this time, in a coaxial manner and with constant heparinized saline infusion, a tree approached 1 microcatheter was advanced over a point 0.014 inch soft tip synchro micro guidewire to the distal end of the Flo guide catheter The Flood guide catheter was advanced to just inside the left internal carotid artery bulb. Thereafter, multiple attempts were made to advance the microcatheter over the micro guidewire first with Ace synchro soft and then a regular synchro guidewire. In 016 headliner double angled micro guidewire was also utilized in order to access the occluded left internal carotid artery. It was noted that there was a severe tortuosity at the junction of the proximal and the middle 1/3 of the left internal carotid artery Following multiple attempts it was decided to stop. A control was then performed through the [facility] guide catheter in the left common carotid  artery which revealed brisk cross-filling of the left internal carotid artery distal cavernous and supraclinoid segments from the left external carotid artery branches via the ipsilateral ophthalmic artery Flow is noted into the left middle cerebral artery as well Also noted was a retrograde opacification from the left posterior communicating artery into the supraclinoid left ICA and then middle cerebral artery An earlier diagnostic catheter arteriogram of the right common carotid artery he had revealed partial cross-filling via the anterior communicating artery from the right internal carotid artery Given the the collateral circulation, it was decided to stop the procedure. Throughout the procedure, the patient's blood pressure and neurological status remained stable No evidence of hemodynamic instability was noted No gross mass-effect or move filling defects or extravasation was seen Prior to the attempted revascularization of the left internal carotid artery occlusion, the patient was given 180 mg of Brilinta, and 81 mg of aspirin orally the an orogastric tube. Also the patient was loaded with 3000 units IV heparin in order to facilitate revascularization. The the 8 [place] flu guide catheter was then retrieved and removed. The 8 [place] Brite tip neurovascular sheath was then exchanged over a J-tip guidewire for a in 8 French 35 cm Brite tip neurovascular sheath. This was then connected to continuous heparinized saline infusion An arteriogram performed through this 3 5 cm 8 French Brite tip neurovascular sheath in the abdominal aorta reveal excellent flow through the common iliacs, the external and internal iliacs, and also the visualized portions of the common femoral arteries below the level of the inguinal ligaments. At the end of procedure, a CT of the brain revealed no evidence of gross intracranial hemorrhage,, or mass effect or midline shift. The patient was left intubated on account of his wound at the  time of his intubation The right groin appeared soft without evidence of a hematoma. Distal pulses in the dorsalis pedis, and posterior regions remained dopplerable  bilaterally. The patient was then transferred to the neuro ICU for further post stroke management. IMPRESSION: . 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 Approximately 90% stenosis of the dominant left vertebrobasilar junction distal to the origin of the left posterior-inferior cerebellar artery, and of approximately 70% of the proximal basilar artery. 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 PLAN: Follow-up in the clinic 1 month post discharge Electronically Signed   By: Julieanne CottonSanjeev  Deveshwar M.D.   On: 08/28/2018 16:08   Ct Cerebral Perfusion W Contrast  Result Date: 08/27/2018 CLINICAL DATA:  Right-sided weakness onset this morning. EXAM: CT PERFUSION BRAIN TECHNIQUE: Multiphase CT imaging of the brain was performed following IV bolus contrast injection. Subsequent parametric perfusion maps were calculated using RAPID software. CONTRAST:  40mL ISOVUE-370 IOPAMIDOL (ISOVUE-370) INJECTION 76% COMPARISON:  CT a head today FINDINGS: CT Brain Perfusion Findings: CBF (<30%) Volume: 0mL Perfusion (Tmax>6.0s) volume: 155mL Mismatch Volume: 155mL ASPECTS on noncontrast CT Head today, not calculated due to extensive chronic ischemic change. No definite acute infarct on CT. Infarct Core: 0 mL Infarction Location:Delayed perfusion throughout the left MCA territory with apparent sparing of the basal ganglia. There is occlusion of the left internal carotid artery on CTA, age indeterminate. Left middle cerebral artery is diseased and may have delayed perfusion due to collateral circulation and stenosis. IMPRESSION: 155 mL of delayed perfusion in the left MCA territory. No core infarction.  Delayed perfusion left MCA territory may be due to severe intracranial atherosclerotic disease as well as occlusion of the left internal carotid artery. Age of the internal carotid artery occlusion is indeterminate. It is possible this delayed perfusion is a chronic or possibly acute finding. Electronically Signed   By: Marlan Palauharles  Clark M.D.   On: 08/27/2018 16:12   Dg Chest Port 1 View  Result Date: 09/03/2018 CLINICAL DATA:  Shortness of breath. EXAM: PORTABLE CHEST 1 VIEW COMPARISON:  Radiograph September 02, 2018. FINDINGS: Stable cardiomegaly. Atherosclerosis of thoracic aorta is noted. No pneumothorax is noted. Right internal jugular catheter is unchanged in position. Stable bilateral pulmonary edema is noted with minimal pleural effusions. Bony thorax is unremarkable. IMPRESSION: Stable cardiomegaly with bilateral pulmonary edema and minimal pleural effusions. Aortic Atherosclerosis (ICD10-I70.0). Electronically Signed   By: Lupita RaiderJames  Green Jr, M.D.   On: 09/03/2018 12:33   Dg Chest Port 1 View  Result Date: 09/02/2018 CLINICAL DATA:  Acute respiratory distress. EXAM: PORTABLE CHEST 1 VIEW COMPARISON:  One-view chest x-ray 09/01/2018 FINDINGS: The heart is enlarged. Patient has been extubated. NG tube was removed. A right IJ line remains. Atherosclerotic changes are present at the aortic arch. Diffuse interstitial edema is stable. Bibasilar airspace disease is worse on the left. IMPRESSION: 1. Interval extubation and removal of NG tube. 2. Stable cardiomegaly and edema consistent with congestive heart failure. 3. Aortic atherosclerosis. Electronically Signed   By: Marin Robertshristopher  Mattern M.D.   On: 09/02/2018 07:29   Dg Chest Port 1 View  Result Date: 09/01/2018 CLINICAL DATA:  Respiratory failure. EXAM: PORTABLE CHEST 1 VIEW COMPARISON:  08/31/2018 FINDINGS: Endotracheal tube terminates 4.2 cm above the carina. Right jugular catheter terminates over the mid SVC. Enteric tube courses into the left upper  abdomen. The cardiac silhouette remains enlarged. Patchy airspace opacities throughout both lungs have not significantly changed. IMPRESSION: Bilateral airspace disease without significant interval change. Electronically Signed   By: Sebastian AcheAllen  Grady M.D.   On:  09/01/2018 06:54   Dg Chest Port 1 View  Result Date: 08/31/2018 CLINICAL DATA:  CXR for Respiratory failure. Hx of HTN, stroke, AAA, Diabetes, and Cardiomyopathy. EXAM: PORTABLE CHEST 1 VIEW COMPARISON:  08/30/2018 FINDINGS: Endotracheal tube is in place, tip 3.8 centimeters above the carina. Nasogastric tube is in place, tip beyond the gastroesophageal junction and off the image. A RIGHT IJ central line tip overlies the superior vena cava. Patient is rotated towards the LEFT. The heart is enlarged. There is persistent airspace filling opacity throughout the RIGHT lung, slightly improved. There is significant opacity in the LEFT lung base, consistent with atelectasis or consolidation and increased since 08/27/2018. IMPRESSION: 1. Slight improvement in aeration of the RIGHT lung. 2. Increased opacity in the LEFT lung base. Electronically Signed   By: Norva Pavlov M.D.   On: 08/31/2018 09:52   Dg Chest Port 1 View  Result Date: 08/30/2018 CLINICAL DATA:  Respiratory failure. Cardiomyopathy. EXAM: PORTABLE CHEST 1 VIEW COMPARISON:  08/29/2018 and 08/27/2018 FINDINGS: Endotracheal tube and NG tube and central line appear unchanged and in good position. There has been progression of the bilateral diffuse pulmonary infiltrates, particularly in the right upper lung zone of the left lung base. Heart size and pulmonary vascularity are normal. No discrete effusions. Aortic atherosclerosis. No acute bone abnormality. IMPRESSION: Progressive bilateral pulmonary infiltrates. Aortic Atherosclerosis (ICD10-I70.0). Electronically Signed   By: Francene Boyers M.D.   On: 08/30/2018 08:48   Dg Chest Port 1 View  Result Date: 08/29/2018 CLINICAL DATA:  Central  line placement EXAM: PORTABLE CHEST 1 VIEW COMPARISON:  08/29/2018 FINDINGS: Right central line is been placed with the tip in the SVC. No pneumothorax. Endotracheal tube and NG tube are unchanged. Cardiomegaly with vascular congestion and mild pulmonary edema, stable. IMPRESSION: Right central line tip in the SVC. No pneumothorax. Continued mild CHF. Electronically Signed   By: Charlett Nose M.D.   On: 08/29/2018 11:58   Dg Chest Port 1 View  Result Date: 08/29/2018 CLINICAL DATA:  PT presented to [facility] ED on 08/27/18 with complaints of right lower extremity weakness, dyspnea and chest pressure. Hx of stroke, Peripheral vascualar disease, cardiomyopathy, and A-fib. EXAM: PORTABLE CHEST 1 VIEW COMPARISON:  08/27/2018 FINDINGS: Mild enlargement of the cardiac silhouette, stable. No mediastinal or hilar masses. There is central vascular congestion. Mild hazy opacity noted in the perihilar regions and medial lung bases bilaterally. No convincing pleural effusion and no pneumothorax. Endotracheal tube tip projects 3.8 cm above the Carina. Nasogastric tube passes below the diaphragm well into the stomach. IMPRESSION: 1. Findings are consistent with mild congestive heart failure with evidence of slight improvement when compared to 08/27/2018. No new abnormalities. 2. Support apparatus is well positioned. Electronically Signed   By: Amie Portland M.D.   On: 08/29/2018 07:26   Dg Chest Port 1 View  Result Date: 08/28/2018 CLINICAL DATA:  Shortness of Breath EXAM: PORTABLE CHEST 1 VIEW COMPARISON:  08/27/2018 FINDINGS: Cardiac shadow is enlarged in size. Aortic calcifications are noted. Endotracheal tube is noted at the level of the carina. This should be withdrawn 2-3 cm. Nasogastric catheter is noted within the stomach. Lungs are well aerated bilaterally with mild interstitial edema similar to that seen on the prior exam. IMPRESSION: Interval intubation with the endotracheal tube at the level of the carina. This  should be withdrawn 2-3 cm. Stable edema bilaterally. These results will be called to the ordering clinician or representative by the Radiologist Assistant, and communication documented in the PACS or  zVision Dashboard. Electronically Signed   By: Alcide Clever M.D.   On: 08/28/2018 00:19   Dg Abd Portable 1v  Result Date: 08/28/2018 CLINICAL DATA:  Orogastric tube placement. EXAM: PORTABLE ABDOMEN - 1 VIEW COMPARISON:  None. FINDINGS: The patient's enteric tube is seen ending overlying the body of the stomach, with the side port about the fundus of the stomach. The visualized bowel gas pattern is grossly unremarkable. A small left pleural effusion is noted. Increased interstitial markings raise concern for pulmonary edema. Vascular congestion is noted. The patient's endotracheal tube is seen ending 2-3 cm above the carina. No acute osseous abnormalities are identified. IMPRESSION: 1. Enteric tube seen ending overlying the body of the stomach, with the side port about the fundus of the stomach. 2. Small left pleural effusion noted. Increased interstitial markings raise concern for pulmonary edema. Electronically Signed   By: Roanna Raider M.D.   On: 08/28/2018 22:08   Ir Percutaneous Art Thrombectomy/infusion Intracranial Inc Diag Angio  Result Date: 08/31/2018 CLINICAL DATA:  New onset right-sided weakness with mild word-finding difficulties. 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 EXAM: IR ANGIO INTRA EXTRACRAN SEL COM CAROTID INNOMINATE UNI RIGHT MOD SED; IR PERCUTANEOUS ART THORMBECTOMY/INFUSION INTRACRANIAL INCLUDE DIAG ANGIO; IR ANGIO VERTEBRAL SEL SUBCLAVIAN INNOMINATE UNI LEFT MOD SED; IR CT HEAD LIMITED COMPARISON:  CT angiogram of the head and neck of 08/27/2018 MEDICATIONS: Heparin 3,000 units IV; . Ancef 2 g IV antibiotic was  administered within 1 hour of the procedure ANESTHESIA/SEDATION: General anesthesia CONTRAST:  Isovue 300 approximately 100 mL FLUOROSCOPY TIME:  Fluoroscopy Time: 60 minutes 0 seconds (4327 mGy). COMPLICATIONS: None immediate. TECHNIQUE: Informed written consent was obtained from the patient after a thorough discussion of the procedural risks, benefits and alternatives. All questions were addressed. Maximal Sterile Barrier Technique was utilized including caps, mask, sterile gowns, sterile gloves, sterile drape, hand hygiene and skin antiseptic. A timeout was performed prior to the initiation of the procedure. The right groin was prepped and draped in the usual sterile fashion. Thereafter using modified Seldinger technique, transfemoral access into the right common femoral artery was obtained without difficulty. Over a 0.035 inch guidewire, a 5 French Pinnacle sheath was inserted. Through this, and also over 0.035 inch guidewire, a 5 [place] JB 1 catheter was advanced to the aortic arch region and selectively positioned in the right common carotid artery, the innominate artery , the left common carotid artery and the left vertebral artery. FINDINGS: The left common carotid artery tear g demonstrates a moderate tortuosity of the proximal left common carotid artery The left external carotid artery proximally has a mild to moderate stenosis. Is branches are otherwise normally opacified The left internal carotid artery at the bulb would demonstrates the complete angiographic occlusion with no evidence of a string sign on the delayed arterial images. The innominate artery arteriogram demonstrates patency of the right subclavian artery and the right common carotid artery proximally There is no angiographic evidence of the right vertebral artery The right common carotid bifurcation demonstrates the right external carotid artery and its major branches to be widely patent The right internal carotid artery just distal to the  bulb has a severe 90% plus stenosis. Distal to this there is a U-shaped configuration of the right internal carotid artery. More distally the vessel is normal caliber The petrous  the cavernous segments and supraclinoid segments are widely patent There is mild atherosclerotic irregularity involving the proximal cavernous, and distal cavernous portions of the right internal carotid artery. Right posterior communicating artery is seen opacifying the right posterior cerebral artery and the distal basilar artery. The right middle cerebral artery has approximately 30 40% stenosis of the right M1 segment The trifurcation branches appear to be widely patent into the capillary and venous phases The right anterior cerebral artery A1 segment and distally demonstrate wide patency is into the capillary and venous phases There is simultaneous cross-filling via the anterior communicating artery of the left anterior cerebral artery A2 segment and distally. Also demonstrated is opacification of the left anterior cerebral A1 segment, with flow noted into the left middle cerebral artery distribution. Unopacified blood is seen in the left middle cerebral artery from the collateral circulation. 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 More distally the vessel is seen to opacify to the cranial skull base There is severe focal of focal stenosis of the left vertebrobasilar junction just distal to the origin of the left posterior-inferior cerebellar artery Additionally there is a approximately 70% stenosis of the proximal basilar artery More distally the basilar artery demonstrates mild caliber irregularity. There is a opacification of the left posterior  cerebral artery the superior cerebellar arteries left greater than right, and the anterior-inferior cerebellar arteries Retrograde opacification of the right vertebrobasilar junction to the level of the right posterior-inferior cerebellar arteries is noted. Caliber irregularity of the right posterior-inferior cerebellar artery suggests intracranial arteriosclerosis. Left internal carotid artery occlusion or revascularization attempt The diagnostic JB 1 catheter left common carotid artery was then exchanged over a 0.035 inch 300 cm a 7 French Pinnacle sheath in the right groin. A diagnostic JB 1 catheter were then advanced over the rise exchange guidewire to the descending thoracic aorta. The 7 French Pinnacle sheath was then exchanged over a 0.035 inch 100 cm Amplatz stiff guidewire for a a 8 French 55 cm Brite tip neurovascular sheath using biplane roadmap technique and constant fluoroscopic guidance. Good good aspiration was obtained from the side port of the neurovascular sheath. This was then connected to continuous heparinized saline infusion. The embolus guidewire was then removed. Through the 8 French neurovascular sheath, over a 0.035 inch roadrunner guidewire, a 5 5 [place] JB 1 catheter was advanced to the recover region and select position in the left common carotid artery. Using biplane technique and constant fluoroscopic guidance, over the of the 5 inch roadrunner guidewire, the 5 [place] JB 1 catheter was advanced to the left external carotid artery and exchanged over a 0.035 inch 300 cm rise exchange guidewire for an 8 French 85 cm flow gap balloon guide catheter which had been prepped with 50% contrast and 50% heparinized saline infusion The guidewire was removed. Good aspiration obtained from the hub of the 8 [place] Flo guide catheter. Gentle contrast injection demonstrated no evidence of spasms dissections or of intraluminal filling defects. At this time, in a coaxial manner and with constant  heparinized saline infusion, a tree approached 1 microcatheter was advanced over a point 0.014 inch soft tip synchro micro guidewire to the distal end of the Flo guide catheter The Flood guide catheter was advanced to just  inside the left internal carotid artery bulb. Thereafter, multiple attempts were made to advance the microcatheter over the micro guidewire first with Ace synchro soft and then a regular synchro guidewire. In 016 headliner double angled micro guidewire was also utilized in order to access the occluded left internal carotid artery. It was noted that there was a severe tortuosity at the junction of the proximal and the middle 1/3 of the left internal carotid artery Following multiple attempts it was decided to stop. A control was then performed through the [facility] guide catheter in the left common carotid artery which revealed brisk cross-filling of the left internal carotid artery distal cavernous and supraclinoid segments from the left external carotid artery branches via the ipsilateral ophthalmic artery Flow is noted into the left middle cerebral artery as well Also noted was a retrograde opacification from the left posterior communicating artery into the supraclinoid left ICA and then middle cerebral artery An earlier diagnostic catheter arteriogram of the right common carotid artery he had revealed partial cross-filling via the anterior communicating artery from the right internal carotid artery Given the the collateral circulation, it was decided to stop the procedure. Throughout the procedure, the patient's blood pressure and neurological status remained stable No evidence of hemodynamic instability was noted No gross mass-effect or move filling defects or extravasation was seen Prior to the attempted revascularization of the left internal carotid artery occlusion, the patient was given 180 mg of Brilinta, and 81 mg of aspirin orally the an orogastric tube. Also the patient was loaded with 3000  units IV heparin in order to facilitate revascularization. The the 8 [place] flu guide catheter was then retrieved and removed. The 8 [place] Brite tip neurovascular sheath was then exchanged over a J-tip guidewire for a in 8 French 35 cm Brite tip neurovascular sheath. This was then connected to continuous heparinized saline infusion An arteriogram performed through this 3 5 cm 8 French Brite tip neurovascular sheath in the abdominal aorta reveal excellent flow through the common iliacs, the external and internal iliacs, and also the visualized portions of the common femoral arteries below the level of the inguinal ligaments. At the end of procedure, a CT of the brain revealed no evidence of gross intracranial hemorrhage,, or mass effect or midline shift. The patient was left intubated on account of his wound at the time of his intubation The right groin appeared soft without evidence of a hematoma. Distal pulses in the dorsalis pedis, and posterior regions remained dopplerable bilaterally. The patient was then transferred to the neuro ICU for further post stroke management. IMPRESSION: . 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 Approximately 90% stenosis of the dominant left vertebrobasilar junction distal to the origin of the left posterior-inferior cerebellar artery, and of approximately 70% of the proximal basilar artery. 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 PLAN: Follow-up in the clinic 1 month post discharge Electronically Signed   By: Julieanne Cotton M.D.   On: 08/28/2018 16:08   Ct Head Code Stroke Wo Contrast`  Result Date: 08/27/2018 CLINICAL DATA:  Code stroke. Slurred speech beginning 3 weeks ago. Right-sided weakness beginning today. EXAM: CT HEAD WITHOUT CONTRAST TECHNIQUE: Contiguous axial images were obtained from the  base of the skull through the vertex without intravenous contrast. COMPARISON:  CT 03/07/2013 FINDINGS: Brain: The brainstem and cerebellum appear normal by CT. Cerebral hemispheres show age related volume  loss. There are old cortical and subcortical infarctions in the right frontal lobe, left parietal vertex, left frontal lobe, and left frontoparietal vertex. The show atrophy and encephalomalacia with gliosis. There is no finding of acute or subacute infarction on this CT. There is old lacunar infarction in the left caudate. Vascular: There is atherosclerotic calcification of the major vessels at the base of the brain. No evidence of acute hyperdense vessel. Skull: Negative Sinuses/Orbits: Clear/normal Other: None ASPECTS ([place] Stroke Program Early CT Score) Aspects is difficult in this patient with old infarctions. I do not suspect an acute insult. IMPRESSION: 1. No acute finding by CT. 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 2. 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 No acute finding is suspected by CT. 3. These results were called by telephone at the time of interpretation on 08/27/2018 at 11:47 am to Dr. Daryel November , who verbally acknowledged these results. Electronically Signed   By: Paulina Fusi M.D.   On: 08/27/2018 11:49   Ir Angio Intra Extracran Sel Com Carotid Innominate Uni R Mod Sed  Result Date: 08/31/2018 CLINICAL DATA:  New onset right-sided weakness with mild word-finding difficulties. 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 EXAM: IR ANGIO INTRA EXTRACRAN SEL COM CAROTID INNOMINATE UNI RIGHT MOD SED; IR PERCUTANEOUS ART THORMBECTOMY/INFUSION INTRACRANIAL INCLUDE DIAG ANGIO; IR ANGIO VERTEBRAL SEL  SUBCLAVIAN INNOMINATE UNI LEFT MOD SED; IR CT HEAD LIMITED COMPARISON:  CT angiogram of the head and neck of 08/27/2018 MEDICATIONS: Heparin 3,000 units IV; . Ancef 2 g IV antibiotic was administered within 1 hour of the procedure ANESTHESIA/SEDATION: General anesthesia CONTRAST:  Isovue 300 approximately 100 mL FLUOROSCOPY TIME:  Fluoroscopy Time: 60 minutes 0 seconds (4327 mGy). COMPLICATIONS: None immediate. TECHNIQUE: Informed written consent was obtained from the patient after a thorough discussion of the procedural risks, benefits and alternatives. All questions were addressed. Maximal Sterile Barrier Technique was utilized including caps, mask, sterile gowns, sterile gloves, sterile drape, hand hygiene and skin antiseptic. A timeout was performed prior to the initiation of the procedure. The right groin was prepped and draped in the usual sterile fashion. Thereafter using modified Seldinger technique, transfemoral access into the right common femoral artery was obtained without difficulty. Over a 0.035 inch guidewire, a 5 French Pinnacle sheath was inserted. Through this, and also over 0.035 inch guidewire, a 5 [place] JB 1 catheter was advanced to the aortic arch region and selectively positioned in the right common carotid artery, the innominate artery , the left common carotid artery and the left vertebral artery. FINDINGS: The left common carotid artery tear g demonstrates a moderate tortuosity of the proximal left common carotid artery The left external carotid artery proximally has a mild to moderate stenosis. Is branches are otherwise normally opacified The left internal carotid artery at the bulb would demonstrates the complete angiographic occlusion with no evidence of a string sign on the delayed arterial images. The innominate artery arteriogram demonstrates patency of the right subclavian artery and the right common carotid artery proximally There is no angiographic evidence of the right vertebral  artery The right common carotid bifurcation demonstrates the right external carotid artery and its major branches to be widely patent The right internal carotid artery just distal to the bulb has a  severe 90% plus stenosis. Distal to this there is a U-shaped configuration of the right internal carotid artery. More distally the vessel is normal caliber The petrous the cavernous segments and supraclinoid segments are widely patent There is mild atherosclerotic irregularity involving the proximal cavernous, and distal cavernous portions of the right internal carotid artery. Right posterior communicating artery is seen opacifying the right posterior cerebral artery and the distal basilar artery. The right middle cerebral artery has approximately 30 40% stenosis of the right M1 segment The trifurcation branches appear to be widely patent into the capillary and venous phases The right anterior cerebral artery A1 segment and distally demonstrate wide patency is into the capillary and venous phases There is simultaneous cross-filling via the anterior communicating artery of the left anterior cerebral artery A2 segment and distally. Also demonstrated is opacification of the left anterior cerebral A1 segment, with flow noted into the left middle cerebral artery distribution. Unopacified blood is seen in the left middle cerebral artery from the collateral circulation. 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 More distally the vessel is seen to opacify to the cranial skull base There is severe focal of focal stenosis of the left vertebrobasilar junction just distal to the origin of the left posterior-inferior cerebellar artery Additionally  there is a approximately 70% stenosis of the proximal basilar artery More distally the basilar artery demonstrates mild caliber irregularity. There is a opacification of the left posterior cerebral artery the superior cerebellar arteries left greater than right, and the anterior-inferior cerebellar arteries Retrograde opacification of the right vertebrobasilar junction to the level of the right posterior-inferior cerebellar arteries is noted. Caliber irregularity of the right posterior-inferior cerebellar artery suggests intracranial arteriosclerosis. Left internal carotid artery occlusion or revascularization attempt The diagnostic JB 1 catheter left common carotid artery was then exchanged over a 0.035 inch 300 cm a 7 French Pinnacle sheath in the right groin. A diagnostic JB 1 catheter were then advanced over the rise exchange guidewire to the descending thoracic aorta. The 7 French Pinnacle sheath was then exchanged over a 0.035 inch 100 cm Amplatz stiff guidewire for a a 8 French 55 cm Brite tip neurovascular sheath using biplane roadmap technique and constant fluoroscopic guidance. Good good aspiration was obtained from the side port of the neurovascular sheath. This was then connected to continuous heparinized saline infusion. The embolus guidewire was then removed. Through the 8 French neurovascular sheath, over a 0.035 inch roadrunner guidewire, a 5 5 [place] JB 1 catheter was advanced to the recover region and select position in the left common carotid artery. Using biplane technique and constant fluoroscopic guidance, over the of the 5 inch roadrunner guidewire, the 5 [place] JB 1 catheter was advanced to the left external carotid artery and exchanged over a 0.035 inch 300 cm rise exchange guidewire for an 8 French 85 cm flow gap balloon guide catheter which had been prepped with 50% contrast and 50% heparinized saline infusion The guidewire was removed. Good aspiration obtained from the hub of the 8 [place]  Flo guide catheter. Gentle contrast injection demonstrated no evidence of spasms dissections or of intraluminal filling defects. At this time, in a coaxial manner and with constant heparinized saline infusion, a tree approached 1 microcatheter was advanced  over a point 0.014 inch soft tip synchro micro guidewire to the distal end of the Flo guide catheter The Flood guide catheter was advanced to just inside the left internal carotid artery bulb. Thereafter, multiple attempts were made to advance the microcatheter over the micro guidewire first with Ace synchro soft and then a regular synchro guidewire. In 016 headliner double angled micro guidewire was also utilized in order to access the occluded left internal carotid artery. It was noted that there was a severe tortuosity at the junction of the proximal and the middle 1/3 of the left internal carotid artery Following multiple attempts it was decided to stop. A control was then performed through the [facility] guide catheter in the left common carotid artery which revealed brisk cross-filling of the left internal carotid artery distal cavernous and supraclinoid segments from the left external carotid artery branches via the ipsilateral ophthalmic artery Flow is noted into the left middle cerebral artery as well Also noted was a retrograde opacification from the left posterior communicating artery into the supraclinoid left ICA and then middle cerebral artery An earlier diagnostic catheter arteriogram of the right common carotid artery he had revealed partial cross-filling via the anterior communicating artery from the right internal carotid artery Given the the collateral circulation, it was decided to stop the procedure. Throughout the procedure, the patient's blood pressure and neurological status remained stable No evidence of hemodynamic instability was noted No gross mass-effect or move filling defects or extravasation was seen Prior to the attempted  revascularization of the left internal carotid artery occlusion, the patient was given 180 mg of Brilinta, and 81 mg of aspirin orally the an orogastric tube. Also the patient was loaded with 3000 units IV heparin in order to facilitate revascularization. The the 8 [place] flu guide catheter was then retrieved and removed. The 8 [place] Brite tip neurovascular sheath was then exchanged over a J-tip guidewire for a in 8 French 35 cm Brite tip neurovascular sheath. This was then connected to continuous heparinized saline infusion An arteriogram performed through this 3 5 cm 8 French Brite tip neurovascular sheath in the abdominal aorta reveal excellent flow through the common iliacs, the external and internal iliacs, and also the visualized portions of the common femoral arteries below the level of the inguinal ligaments. At the end of procedure, a CT of the brain revealed no evidence of gross intracranial hemorrhage,, or mass effect or midline shift. The patient was left intubated on account of his wound at the time of his intubation The right groin appeared soft without evidence of a hematoma. Distal pulses in the dorsalis pedis, and posterior regions remained dopplerable bilaterally. The patient was then transferred to the neuro ICU for further post stroke management. IMPRESSION: . 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 Approximately 90% stenosis of the dominant left vertebrobasilar junction distal to the origin of the left posterior-inferior cerebellar artery, and of approximately 70% of the proximal basilar artery. 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 PLAN: Follow-up in the clinic 1 month post discharge Electronically Signed   By: Julieanne Cotton M.D.   On: 08/28/2018 16:08   Ir Angio Vertebral Sel Subclavian Innominate Uni L Mod  Sed  Result Date: 08/31/2018 CLINICAL DATA:  New onset right-sided weakness with mild word-finding difficulties. 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 EXAM: IR ANGIO INTRA EXTRACRAN SEL COM CAROTID INNOMINATE UNI RIGHT MOD SED; IR PERCUTANEOUS ART THORMBECTOMY/INFUSION INTRACRANIAL INCLUDE DIAG ANGIO; IR ANGIO VERTEBRAL SEL SUBCLAVIAN INNOMINATE UNI LEFT MOD SED; IR CT HEAD LIMITED COMPARISON:  CT angiogram of the head and neck of 08/27/2018 MEDICATIONS: Heparin 3,000 units IV; . Ancef 2 g IV antibiotic was administered within 1 hour of the procedure ANESTHESIA/SEDATION: General anesthesia CONTRAST:  Isovue 300 approximately 100 mL FLUOROSCOPY TIME:  Fluoroscopy Time: 60 minutes 0 seconds (4327 mGy). COMPLICATIONS: None immediate. TECHNIQUE: Informed written consent was obtained from the patient after a thorough discussion of the procedural risks, benefits and alternatives. All questions were addressed. Maximal Sterile Barrier Technique was utilized including caps, mask, sterile gowns, sterile gloves, sterile drape, hand hygiene and skin antiseptic. A timeout was performed prior to the initiation of the procedure. The right groin was prepped and draped in the usual sterile fashion. Thereafter using modified Seldinger technique, transfemoral access into the right common femoral artery was obtained without difficulty. Over a 0.035 inch guidewire, a 5 French Pinnacle sheath was inserted. Through this, and also over 0.035 inch guidewire, a 5 [place] JB 1 catheter was advanced to the aortic arch region and selectively positioned in the right common carotid artery, the innominate artery , the left common carotid artery and the left vertebral artery. FINDINGS: The left common carotid artery tear g demonstrates a moderate tortuosity of the proximal left  common carotid artery The left external carotid artery proximally has a mild to moderate stenosis. Is branches are otherwise normally opacified The left internal carotid artery at the bulb would demonstrates the complete angiographic occlusion with no evidence of a string sign on the delayed arterial images. The innominate artery arteriogram demonstrates patency of the right subclavian artery and the right common carotid artery proximally There is no angiographic evidence of the right vertebral artery The right common carotid bifurcation demonstrates the right external carotid artery and its major branches to be widely patent The right internal carotid artery just distal to the bulb has a severe 90% plus stenosis. Distal to this there is a U-shaped configuration of the right internal carotid artery. More distally the vessel is normal caliber The petrous the cavernous segments and supraclinoid segments are widely patent There is mild atherosclerotic irregularity involving the proximal cavernous, and distal cavernous portions of the right internal carotid artery. Right posterior communicating artery is seen opacifying the right posterior cerebral artery and the distal basilar artery. The right middle cerebral artery has approximately 30 40% stenosis of the right M1 segment The trifurcation branches appear to be widely patent into the capillary and venous phases The right anterior cerebral artery A1 segment and distally demonstrate wide patency is into the capillary and venous phases There is simultaneous cross-filling via the anterior communicating artery of the left anterior cerebral artery A2 segment and distally. Also demonstrated is opacification of the left anterior cerebral A1 segment, with flow noted into the left middle cerebral artery distribution. Unopacified blood is seen in the left middle cerebral artery from the collateral circulation. 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 More distally the vessel  is seen to opacify to the cranial skull base There is severe focal of focal stenosis of the left vertebrobasilar junction just distal to the origin of the left posterior-inferior cerebellar artery Additionally there is a approximately 70% stenosis of the proximal basilar artery More distally the basilar artery demonstrates mild caliber irregularity. There is a opacification of the left posterior cerebral artery the superior cerebellar arteries left greater than right, and the anterior-inferior cerebellar arteries Retrograde opacification of the right vertebrobasilar junction to the level of the right posterior-inferior cerebellar arteries is noted. Caliber irregularity of the right posterior-inferior cerebellar artery suggests intracranial arteriosclerosis. Left internal carotid artery occlusion or revascularization attempt The diagnostic JB 1 catheter left common carotid artery was then exchanged over a 0.035 inch 300 cm a 7 French Pinnacle sheath in the right groin. A diagnostic JB 1 catheter were then advanced over the rise exchange guidewire to the descending thoracic aorta. The 7 French Pinnacle sheath was then exchanged over a 0.035 inch 100 cm Amplatz stiff guidewire for a a 8 French 55 cm Brite tip neurovascular sheath using biplane roadmap technique and constant fluoroscopic guidance. Good good aspiration was obtained from the side port of the neurovascular sheath. This was then connected to continuous heparinized saline infusion. The embolus guidewire was then removed. Through the 8 French neurovascular sheath, over a 0.035 inch roadrunner guidewire, a 5 5 [place] JB 1 catheter was advanced to the recover region and  select position in the left common carotid artery. Using biplane technique and constant fluoroscopic guidance, over the of the 5 inch roadrunner guidewire, the 5 [place] JB 1 catheter was advanced to the left external carotid artery and exchanged over a 0.035 inch 300 cm rise exchange guidewire for an 8 French 85 cm flow gap balloon guide catheter which had been prepped with 50% contrast and 50% heparinized saline infusion The guidewire was removed. Good aspiration obtained from the hub of the 8 [place] Flo guide catheter. Gentle contrast injection demonstrated no evidence of spasms dissections or of intraluminal filling defects. At this time, in a coaxial manner and with constant heparinized saline infusion, a tree approached 1 microcatheter was advanced over a point 0.014 inch soft tip synchro micro guidewire to the distal end of the Flo guide catheter The Flood guide catheter was advanced to just inside the left internal carotid artery bulb. Thereafter, multiple attempts were made to advance the microcatheter over the micro guidewire first with Ace synchro soft and then a regular synchro guidewire. In 016 headliner double angled micro guidewire was also utilized in order to access the occluded left internal carotid artery. It was noted that there was a severe tortuosity at the junction of the proximal and the middle 1/3 of the left internal carotid artery Following multiple attempts it was decided to stop. A control was then performed through the [facility] guide catheter in the left common carotid artery which revealed brisk cross-filling of the left internal carotid artery distal cavernous and supraclinoid segments from the left external carotid artery branches via the ipsilateral ophthalmic artery Flow is noted into the left middle cerebral artery as well Also noted was a retrograde opacification from the left posterior communicating artery into the supraclinoid left ICA and then middle cerebral artery An earlier  diagnostic catheter arteriogram of the right common carotid artery he had revealed partial cross-filling via the anterior communicating artery from the right internal carotid artery Given the the collateral circulation, it was decided to stop the procedure. Throughout the  procedure, the patient's blood pressure and neurological status remained stable No evidence of hemodynamic instability was noted No gross mass-effect or move filling defects or extravasation was seen Prior to the attempted revascularization of the left internal carotid artery occlusion, the patient was given 180 mg of Brilinta, and 81 mg of aspirin orally the an orogastric tube. Also the patient was loaded with 3000 units IV heparin in order to facilitate revascularization. The the 8 [place] flu guide catheter was then retrieved and removed. The 8 [place] Brite tip neurovascular sheath was then exchanged over a J-tip guidewire for a in 8 French 35 cm Brite tip neurovascular sheath. This was then connected to continuous heparinized saline infusion An arteriogram performed through this 3 5 cm 8 French Brite tip neurovascular sheath in the abdominal aorta reveal excellent flow through the common iliacs, the external and internal iliacs, and also the visualized portions of the common femoral arteries below the level of the inguinal ligaments. At the end of procedure, a CT of the brain revealed no evidence of gross intracranial hemorrhage,, or mass effect or midline shift. The patient was left intubated on account of his wound at the time of his intubation The right groin appeared soft without evidence of a hematoma. Distal pulses in the dorsalis pedis, and posterior regions remained dopplerable bilaterally. The patient was then transferred to the neuro ICU for further post stroke management. IMPRESSION: . 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 Approximately 90% stenosis of the dominant left vertebrobasilar junction distal to the origin of the left posterior-inferior cerebellar artery, and of approximately 70% of the proximal basilar artery. 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 PLAN: Follow-up in the clinic 1 month post discharge Electronically Signed   By: Julieanne Cotton M.D.   On: 08/28/2018 16:08   Vas [place] Lower Extremity Arterial Duplex  Result Date: 09/04/2018 LOWER EXTREMITY ARTERIAL DUPLEX STUDY  Current ABI: Not obtained Limitations: Cerebral angiogram 08/27/18, now with bleeding, pain, and bruising              at groin stick site. Performing Technologist: Gertie Fey MHA, RDMS, RVT, RDCS  Examination Guidelines: A complete evaluation includes B-mode imaging, spectral Doppler, color Doppler, and power Doppler as needed of all accessible portions of each vessel. Bilateral testing is considered an integral part of a complete examination. Limited examinations for reoccurring indications may be performed as noted.  Right Duplex Findings: +----------+--------+-----+--------+----------+--------+           PSV cm/sRatioStenosisWaveform  Comments +----------+--------+-----+--------+----------+--------+ CFA Prox  102                  monophasic         +----------+--------+-----+--------+----------+--------+ CFA Distal90                   monophasic         +----------+--------+-----+--------+----------+--------+ PTA Distal32                   monophasic         +----------+--------+-----+--------+----------+--------+ DP        44                   monophasic         +----------+--------+-----+--------+----------+--------+  Summary: Right: A right common femoral artery pseudoaneurysm was identified measuring 2.8 x 1.5 x 2.5 cm, with a neck  measuring 1.3cm long x 0.6cm wide. Pseudoaneurysm measures 2.1cm from the surface of the skin. The  proximal right common femoral artery exhibits a monophasic waveform, suggestive of possible aortoiliac obstruction. Distal posterior tibial artery exhibits monophasic flow, and the right dorsalis pedis artery exhibits monophasic flow, suggestive of proximal obstruction.  See table(s) above for measurements and observations.    Preliminary     PHYSICAL EXAM per Dr, Cherlyn Labella:  [97.7 F (36.5 C)-98.3 F (36.8 C)] 98.3 F (36.8 C) (01/10 1148) Pulse Rate:  [74-113] 91 (01/10 1148) Resp:  [18-23] 18 (01/10 1148) BP: (126-147)/(61-94) 141/78 (01/10 1148) SpO2:  [91 %-100 %] 99 % (01/10 1148)     ASSESSMENT/PLAN Mr.Mccabe Elie Goody a 78 y.o.malewith history of HTN, atrial fibrillation on Pradaxa last dose last evening, urethral stricture, cardiomyopathy, peripheral vascular disease, hypertension, hyperlipidemia, stroke with some balance issues, bilateral lymphedema vitamin D deficiencypresenting with right lower extremity numbness and weaknessand suspected new Lt ICA occlusion.Hedid not receive IV t-PA due to late presentation.  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  MRI head -left MCA and ACA territory scattered small infarcts  CTA H&N - left ICA, right VA occlusion.  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  2D Echo - EF 60 to 65%  LDL- 81  HgbA1c- 7.1  VTE prophylaxis -SCDs  aspirin 81 mg daily and Pradaxa (dabigatran) twice a dayprior to admission, now on Eliquis 5 mg twice daily  Ongoing aggressive stroke risk factor  management  Therapy recommendations: SNF  Disposition: Pending  Right femoral A. 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 early on board  No intervention needed at this time  Continue dressing drainage  Pseudoaneurysm need to be addressed in the future with thrombin injection  Aspiration pneumonitis  Fever Tmax 101.2->101.3-> afebrile->101.3-> afebrile  Leukocytosis WBC 22.2-15.5-11.4-13.9->16.6-> 19.2->14.7->18.6  CXR CHF  Off lasix  Sputum culture normal flora  Off Zosyn today  UA negative  Respiratory distress  Was intubated on ventilation  Self extubated on 09/01/18  Off sedation  So far on nasal cannula, tolerating  CXR pending  Atrial fibrillation, chronic  On Pradaxa PTA, compliant with medication  On metoprolol, rate controlled  On Eliquis and amiodarone po  Rate controlled  BP on the lower side - decrease metoprolol to 25mg  bid  Start gentle hydration - UOP decreased   Hypotension History of hypertension  BP stable   Off neo  On metoprolol - decreased dose to 25mg  bid  Avoid hypotension due to 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 as well as basal artery stenosis  BP goal 120-160  CHF  CXR showed CHF  Fluid overload - off lasix   Anasarca much improved  Cardiology signed off  CXR pending today  Gentle hydration IVF @ 30 given decreased UOP  Hyperlipidemia  Lipid lowering medication UJW:JXBJ  LDL81, goal < 70  Current lipid lowering medication:none (statin intolerant - abnormal labs)  Add zetia   Continue zetia  on d/c  Diabetes  HgbA1c7.1, goal < 7.0  Uncontrolled  Hyperglycemia improved  SSI  CBG monitoring  On Levemir, continue  Intermittent agitation  Likely due to aphasia, right hemiparesis and discomfort position with obesity  Better with family  in room  Decrease seroquel to 25mg  QHs due to drowsy in the am  Close monitoring  Other Stroke Risk Factors  Advanced age  Former cigarette smoker - quit  Obesity,Body mass index is 47.79 kg/m., recommend weight loss, diet and exercise as appropriate   Hx stroke/TIA  PVD  OSA - continue CPAP at night  Other Active Problems  Urethral stricture -on Foley  Diffuse anasarca - much improved, continue diuresis one more day  Hypokalemia potassium 2.8->3.3, supplement  Low Magnesium 1.8 - supplement   Valentina Lucks, MSN, NP-C [facility] [phone]  Hospital day # 16 Agree with above plan. Await transfer to skilled nursing facility after insurance approval hopefully next week. No family available at the bedside for discussion Delia Heady, MD  To contact Stroke Continuity provider, please refer to [email or web address]. After hours, contact General Neurology

## 2018-09-12 NOTE — Progress Notes (Signed)
CSW following for discharge plan. [employer] asked for updated therapy notes this morning. CSW alerted PT and OT this morning, and sent updates to SNF.  CSW to continue to follow. Patient continues to await authorization to admit to SNF.  Blenda Nicely, [place] Clinical Social Worker [phone]

## 2018-09-12 NOTE — Progress Notes (Signed)
Orthopedic Tech Progress Note Patient Details:  Darren Frazier 1940-12-24 [id number]  Applied Bi- Lateral Prafo Boots at nurses request.    Ortho Devices Type of Ortho Device: Prafo boot/shoe Ortho Device/Splint Location: LLE & RLE  Ortho Device/Splint Interventions: Ordered, Application, Adjustment   Post Interventions Patient Tolerated: Well Instructions Provided: Adjustment of device, Care of device   [name] [name] 09/12/2018, 3:13 PM

## 2018-09-12 NOTE — Progress Notes (Signed)
Physical Therapy Treatment Patient Details Name: Darren Frazier MRN: [id number] DOB: [birth year] Today's Date: 09/12/2018    History of Present Illness 78 yo male  PMH including Cardiomyopathy (HCC), DM, Dysrhythmia, AAA, Hyperlipidemia, HTN, Hypothyroidism, Morbid obesity PVD, Stroke (2011), Tremors of nervous system. Presented to [facility] 12/25 with Rt leg numbness and weakness.  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  He was transferred to [facility].  He was intubated for neuro-IR intervention 12/25, self-extubated 12/30. MRI head left MCA/ACA territory scattered small acute infarcts; multiple scattered chronic underlying cortical infarcts involving the bilateral cerebral and right cerebellar hemispheres; moderately advanced cerebral atrophy with chronic small vessel.     PT Comments    Pt performed transfer to edge of bed and performed LE strengthening on L side and sitting balance training to cross midline to L.  Pt able to progress to standing with bed elevated.  He stood for brief period of time before fatigue.  Based on his functional capabilities, decreased strength and cognitive deficits he continues to benefit from skilled rehab at [facility] placement before return home.  Plan next session for continued strengthening and functional mobility.      Follow Up Recommendations  SNF     Equipment Recommendations  Wheelchair (measurements PT);Wheelchair cushion (measurements PT);Other (comment)(hoyer lift and hospital bed if he returns home.  )    Recommendations for Other Services       Precautions / Restrictions Precautions Precautions: Fall Restrictions Weight Bearing Restrictions: No    Mobility  Bed Mobility Overal bed mobility: Needs Assistance Bed Mobility: Rolling;Sidelying to Sit;Sit to Supine Rolling: Max assist;+2 for physical assistance;+2 for safety/equipment Sidelying to sit: Max assist;+2 for physical assistance   Sit to supine: Total assist;+2 for physical assistance;+2 for  safety/equipment(+3 for improved ease of transfer)   General bed mobility comments: Pt performed rolling with max +2 and sidelying<>sit.  Pt required increased assistance to return back to bed.    Transfers Overall transfer level: Needs assistance Equipment used: Ambulation equipment used(sara stedy) Transfers: Sit to/from Stand Sit to Stand: +2 physical assistance;Max assist;From elevated surface         General transfer comment: pt able to activate sit<stand and needs cues to sustain attention to task. pt needs cues to distract while upright due to request to return to EOB sitting  Ambulation/Gait                 Stairs             Wheelchair Mobility    Modified Rankin (Stroke Patients Only)       Balance Overall balance assessment: Needs assistance Sitting-balance support: Single extremity supported;Feet supported Sitting balance-Leahy Scale: Poor Sitting balance - Comments: R lateral lean and able to hold with L UE on bed rail to help activate L lean     Standing balance-Leahy Scale: Poor Standing balance comment: reliant on L UE support and external assistance to weight shift to L side.                              Cognition Arousal/Alertness: Awake/alert Behavior During Therapy: Impulsive Overall Cognitive Status: Impaired/Different from baseline Area of Impairment: Orientation;Attention;Memory;Following commands;Safety/judgement;Awareness;Problem solving                 Orientation Level: Disoriented to;Place;Situation Current Attention Level: Sustained Memory: Decreased recall of precautions;Decreased short-term memory Following Commands: Follows one step commands with increased time;Follows one step commands inconsistently  Safety/Judgement: Decreased awareness of safety;Decreased awareness of deficits Awareness: Intellectual Problem Solving: Slow processing;Decreased initiation;Difficulty sequencing;Requires verbal  cues;Requires tactile cues General Comments: pt able to sustain attention for ~ 15 seconds then needs repetitive cues or tactile cues. pt lacks awareness to visual deficits      Exercises General Exercises - Lower Extremity Long Arc Quad: AROM;Left;10 reps;Seated    General Comments General comments (skin integrity, edema, etc.): oxygen [place] , VSS      Pertinent Vitals/Pain Pain Assessment: No/denies pain    Home Living                      Prior Function            PT Goals (current goals can now be found in the care plan section) Acute Rehab PT Goals Patient Stated Goal: none stated Potential to Achieve Goals: Fair Progress towards PT goals: Progressing toward goals    Frequency    Min 3X/week      PT Plan Current plan remains appropriate    Co-evaluation PT/OT/SLP Co-Evaluation/Treatment: Yes Reason for Co-Treatment: Complexity of the patient's impairments (multi-system involvement);Necessary to address cognition/behavior during functional activity;For patient/therapist safety;To address functional/ADL transfers PT goals addressed during session: Mobility/safety with mobility OT goals addressed during session: ADL's and self-care      AM-PAC PT "6 Clicks" Mobility   Outcome Measure  Help needed turning from your back to your side while in a flat bed without using bedrails?: Total Help needed moving from lying on your back to sitting on the side of a flat bed without using bedrails?: Total Help needed moving to and from a bed to a chair (including a wheelchair)?: Total Help needed standing up from a chair using your arms (e.g., wheelchair or bedside chair)?: Total Help needed to walk in hospital room?: Total Help needed climbing 3-5 steps with a railing? : Total 6 Click Score: 6    End of Session Equipment Utilized During Treatment: Oxygen;Gait belt Activity Tolerance: Patient tolerated treatment well Patient left: in chair;with call bell/phone  within reach;with family/visitor present;with chair alarm set Nurse Communication: Mobility status;Need for lift equipment PT Visit Diagnosis: Unsteadiness on feet (R26.81);Other abnormalities of gait and mobility (R26.89);Muscle weakness (generalized) (M62.81);Difficulty in walking, not elsewhere classified (R26.2)     Time: 7096-4383 PT Time Calculation (min) (ACUTE ONLY): 45 min  Charges:  $Therapeutic Activity: 23-37 mins                     Joycelyn Rua, PTA Acute Rehabilitation Services Pager [phone] Office [phone]     Darren Frazier Artis Delay 09/12/2018, 12:31 PM

## 2018-09-13 LAB — BASIC METABOLIC PANEL
Anion gap: 9 (ref 5–15)
BUN: 13 mg/dL (ref 8–23)
CO2: 29 mmol/L (ref 22–32)
Calcium: 9.1 mg/dL (ref 8.9–10.3)
Chloride: 102 mmol/L (ref 98–111)
Creatinine, Ser: 1.2 mg/dL (ref 0.61–1.24)
GFR calc Af Amer: >60 mL/min (ref >60)
GFR calc non Af Amer: 58 mL/min — ABNORMAL LOW (ref >60)
GLUCOSE: 126 mg/dL — AB (ref 70–99)
Potassium: 4.2 mmol/L (ref 3.5–5.1)
Sodium: 140 mmol/L (ref 135–145)

## 2018-09-13 LAB — CBC
HEMATOCRIT: 31.9 % — AB (ref 39.0–52.0)
Hemoglobin: 9.9 g/dL — ABNORMAL LOW (ref 13.0–17.0)
MCH: 32.6 pg (ref 26.0–34.0)
MCHC: 31 g/dL (ref 30.0–36.0)
MCV: 104.9 fL — ABNORMAL HIGH (ref 80.0–100.0)
Platelets: 451 10*3/uL — ABNORMAL HIGH (ref 150–400)
RBC: 3.04 MIL/uL — ABNORMAL LOW (ref 4.22–5.81)
RDW: 15.1 % (ref 11.5–15.5)
WBC: 12.9 10*3/uL — ABNORMAL HIGH (ref 4.0–10.5)
nRBC: 0 % (ref 0.0–0.2)

## 2018-09-13 LAB — GLUCOSE, CAPILLARY
Glucose-Capillary: 133 mg/dL — ABNORMAL HIGH (ref 70–99)
Glucose-Capillary: 185 mg/dL — ABNORMAL HIGH (ref 70–99)
Glucose-Capillary: 118 mg/dL — ABNORMAL HIGH (ref 70–99)

## 2018-09-13 NOTE — Progress Notes (Signed)
STROKE TEAM PROGRESS NOTE   SUBJECTIVE (INTERVAL HISTORY) No family at bedside. Patient in bed awake, alert on 2L [place] 02.  Answering questions with simple answer, but paucity of speech, not able to say sentences. Still has right hemiplegia. Waiting on insurance approval for SNF.   OBJECTIVE Temp:  [97.6 F (36.4 C)-98.1 F (36.7 C)] 97.8 F (36.6 C) (01/11 1147) Pulse Rate:  [82-106] 83 (01/11 1147) Cardiac Rhythm: Atrial fibrillation (01/11 0700) Resp:  [18-22] 20 (01/11 1147) BP: (142-168)/(60-90) 146/69 (01/11 1147) SpO2:  [96 %-100 %] 96 % (01/11 1147)  Recent Labs  Lab 09/12/18 1146 09/12/18 1558 09/12/18 2149 09/13/18 0558 09/13/18 1140  GLUCAP 160* 121* 182* 133* 185*   Recent Labs  Lab 09/09/18 0620 09/10/18 0452 09/11/18 0625 09/12/18 1322 09/13/18 0500  NA 142 141 141 139 140  K 4.4 4.2 4.5 5.1 4.2  CL 105 106 106 104 102  CO2 26 24 26 28 29   GLUCOSE 162* 150* 193* 158* 126*  BUN 17 14 16 15 13   CREATININE 1.29* 1.25* 1.36* 1.18 1.20  CALCIUM 9.2 9.0 8.7* 9.2 9.1   No results for input(s): AST, ALT, ALKPHOS, BILITOT, PROT, ALBUMIN in the last 168 hours. Recent Labs  Lab 09/09/18 0620 09/10/18 0452 09/11/18 0625 09/12/18 0500 09/13/18 0500  WBC 12.2* 13.3* 13.4* 12.5* 12.9*  HGB 11.6* 10.4* 10.3* 9.9* 9.9*  HCT 36.4* 33.4* 33.6* 30.6* 31.9*  MCV 107.4* 105.0* 105.7* 104.1* 104.9*  PLT 369 501* 516* 383 451*   No results for input(s): CKTOTAL, CKMB, CKMBINDEX, TROPONINI in the last 168 hours. No results for input(s): LABPROT, INR in the last 72 hours. No results for input(s): COLORURINE, LABSPEC, PHURINE, GLUCOSEU, HGBUR, BILIRUBINUR, KETONESUR, PROTEINUR, UROBILINOGEN, NITRITE, LEUKOCYTESUR in the last 72 hours.  Invalid input(s): APPERANCEUR     Component Value Date/Time   CHOL 138 08/28/2018 0600   TRIG 171 (H) 08/28/2018 0921   HDL 24 (L) 08/28/2018 0600   CHOLHDL 5.8 08/28/2018 0600   VLDL 33 08/28/2018 0600   LDLCALC 81 08/28/2018 0600    Lab Results  Component Value Date   HGBA1C 7.1 (H) 08/28/2018   No results found for: LABOPIA, COCAINSCRNUR, LABBENZ, AMPHETMU, THCU, LABBARB  No results for input(s): ETH in the last 168 hours.   IMAGING  I have personally reviewed the radiological images below and agree with the radiology interpretations.  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  Severe irregular atherosclerotic disease of the right carotid bifurcation with stenosis of 80% in the bulb region and 30% in the distal ICA.  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 50% stenosis of the left vertebral artery origin.  Severe atherosclerotic disease in both carotid siphon regions. Right siphon stenosis estimated at 50-70%. Reconstituted flow in the distal left siphon and supraclinoid region. 80% supraclinoid stenosis. Flow in both anterior and middle cerebral artery territories. Stenosis of both M1 segments estimated at 50-70%.  I do not identify any large or medium vessel occlusion acutely within the MCA branch vessels.  Severely disease left V4 segment with serial stenoses proximal to the basilar estimated at 70%. Basilar atherosclerotic irregularity without flow limiting stenosis.  Posterior circulation branch vessels do show flow, including right PICA which is supplied by retrograde flow in the distal right vertebral artery.   Mr Brain Wo Contrast 08/28/2018 IMPRESSION:  1. Scattered multifocal acute ischemic left  MCA territory infarcts as above, likely embolic in nature. Possible faint scattered associated petechial hemorrhage without frank hemorrhagic transformation or significant mass effect.  2. Abnormal flow void within the left ICA to its cavernous segment, consistent with known left ICA occlusion.  3. Multiple scattered chronic underlying cortical infarcts involving the bilateral cerebral and right  cerebellar hemispheres.  4. Moderately advanced cerebral atrophy with chronic small vessel ischemic disease.   Ct Abdomen Pelvis W Contrast 08/29/2018 IMPRESSION:  1. Small hematoma in the right inguinal region in the panniculus, probably secondary to the recent angiography.  2. Bilateral consolidative infiltrates in the posterior aspect of both lower lobes with tiny adjacent pleural effusions.  3. Small amount of nonspecific ascites.  4. Slightly prominent gas in the bowel without evidence of bowel obstruction. This probably represents an ileus.   Ir Ct Head Ltd 08/31/2018 IMPRESSION:  Status post attempted endovascular revascularization of occluded left carotid artery at the bulb. 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 Approximately 90% stenosis of the dominant left vertebrobasilar junction distal to the origin of the left posterior-inferior cerebellar artery, and of approximately 70% of the proximal basilar artery. 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  PLAN: Follow-up in the clinic 1 month post discharge   Ct Cerebral Perfusion W Contrast 08/27/2018 IMPRESSION:  155 mL of delayed perfusion in the left MCA territory. No core infarction. Delayed perfusion left MCA territory may be due to severe intracranial atherosclerotic disease as well as occlusion of the left internal carotid artery. Age of the internal carotid artery occlusion is indeterminate. It is possible this delayed perfusion is a chronic or possibly acute finding.   Ct Head Code Stroke Wo Contrast` 08/27/2018 IMPRESSION:  1. No acute finding by CT. 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  2. 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 No acute finding is suspected by CT.     Vas [place] Lower Extremity Arterial  Duplex 09/04/2018 Summary:  Right: A right common femoral artery pseudoaneurysm was identified measuring 2.8 x 1.5 x 2.5 cm, with a neck measuring 1.3cm long x 0.6cm wide. Pseudoaneurysm measures 2.1cm from the surface of the skin. The proximal right common femoral artery exhibits a monophasic waveform, suggestive of possible aortoiliac obstruction. Distal posterior tibial artery exhibits monophasic flow, and the right dorsalis pedis artery exhibits monophasic flow, suggestive of proximal obstruction.      PHYSICAL EXAM per Dr, Cherlyn Labella:  [97.6 F (36.4 C)-98.1 F (36.7 C)] 97.8 F (36.6 C) (01/11 1147) Pulse Rate:  [82-106] 83 (01/11 1147) Resp:  [18-22] 20 (01/11 1147) BP: (142-168)/(60-90) 146/69 (01/11 1147) SpO2:  [96 %-100 %] 96 % (01/11 1147)  General - Well nourished, well developed elderly Caucasian male, drowsy sleepy  Ophthalmologic - fundi not visualized due to noncooperation.  Cardiovascular - irregularly irregular heart rate and rhythm.  Neuro - awake alert,  able to following simple commands. Able to repeat sentences and spontaneous speech but paucity of speech, not able to have spontaneous speech in sentences, able to answer questions with simple words. PERRLA, EOMI, able to track objects on the both sides, inconsistent blinking to visual threat bilaterally.  Facial symmetric.  Tongue midline.  Left upper extremity 4/5.  Right lower extremity and right upper extremity flaccid.  Left lower extremity proximal 2+/5 and distal 3/5.  DTR 1+, Babinski negative.  Sensation symmetrical, coordination slow but intact  on the left finger-to-nose and gait not tested.    ASSESSMENT/PLAN Darren Frazier a 78 y.o.malewith history of HTN, atrial fibrillation on Pradaxa last dose last evening, urethral stricture, cardiomyopathy, peripheral vascular disease, hypertension, hyperlipidemia, stroke with some balance issues, bilateral lymphedema vitamin D deficiencypresenting  with right lower extremity numbness and weaknessand suspected new Lt ICA occlusion.Hedid not receive IV t-PA due to late presentation.  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  MRI head -left MCA and ACA territory scattered small infarcts  CTA H&N - left ICA, right VA occlusion.  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  2D Echo - EF 60 to 65%  LDL- 81  HgbA1c- 7.1  VTE prophylaxis -SCDs  aspirin 81 mg daily and Pradaxa (dabigatran) twice a dayprior to admission, now on Eliquis 5 mg twice daily  Ongoing aggressive stroke risk factor management  Therapy recommendations: SNF  Disposition: Pending  Right femoral A. 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 early on board - "At some point will need repeat duplex to determine if he is spontaneously resolved his false aneurysm".  No intervention needed at this time  Continue dressing drainage  Pseudoaneurysm need to be addressed in the future with thrombin injection  Aspiration pneumonitis  Fever Tmax 101.2->101.3-> afebrile->101.3-> afebrile  Leukocytosis WBC 22.2-15.5-11.4-13.9->16.6-> 19.2->14.7->18.6->12.9  CXR CHF  Off lasix  Sputum culture normal flora  Off Zosyn   UA negative  Respiratory distress  Was intubated on ventilation  Self extubated on 09/01/18  Off sedation  So far on nasal cannula, tolerating  CXR - pending in am  Atrial  fibrillation, chronic  On Pradaxa PTA, compliant with medication  On metoprolol, rate controlled  On Eliquis and amiodarone po  Rate controlled  BP on the lower side - decrease metoprolol to 25mg  bid  on gentle hydration - Cre normalized  Hypotension History of hypertension  BP stable   Off neo  On metoprolol - decreased dose to 25mg  bid  Avoid hypotension due to 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  Stable changes of congestive failure and interstitial edema.  Gentle hydration IVF @ 30   Hyperlipidemia  Lipid lowering medication ZOX:WRUE  LDL81, goal < 70  Current lipid lowering medication:none (statin intolerant - abnormal labs)  Add zetia   Continue zetia on d/c  Diabetes  HgbA1c7.1, goal < 7.0  Uncontrolled  Hyperglycemia improved  SSI  CBG monitoring  On Levemir, continue  Intermittent agitation  Likely due to aphasia, right hemiparesis and discomfort position with obesity  Better with family in room  seroquel to 25mg  QHs  Close monitoring  Other Stroke Risk Factors  Advanced age  Former cigarette smoker - quit  Obesity,Body mass index is 47.79 kg/m., recommend weight loss, diet and exercise  as appropriate   Hx stroke/TIA  PVD  OSA - continue CPAP at night  Other Active Problems  Urethral stricture -on Foley  Diffuse anasarca - resolved  Hypokalemia potassium 2.8->3.3, supplement -> 4.2  Low Magnesium 1.8 - supplement  Hb -10.3-9.9-9.9 stable  WBCs -13.4-12.5-12.9 stable  Hospital day # 17  Marvel PlanJindong Ravynn Hogate, MD PhD Stroke Neurology 09/13/2018 4:59 PM   To contact Stroke Continuity provider, please refer to [email or web address]. After hours, contact General Neurology

## 2018-09-13 NOTE — Progress Notes (Signed)
CSW called [employer] to check on insurance authorization update. No one in admissions works over the weekend. Will need to follow up with facility on Monday when staff is back in the office.   CSW will need to follow up on Monday. No further weekend needs.   Drucilla Schmidt, MSW, LCSW-A Clinical Social Worker [employer]

## 2018-09-13 NOTE — Progress Notes (Signed)
Pt refused cpap for the night.  

## 2018-09-13 NOTE — Progress Notes (Signed)
Patient could not keep CPAP on at night, he pulls it off, even with mittens on, had to use nasal cannula, which he prefers better than CPAP.

## 2018-09-14 ENCOUNTER — Inpatient Hospital Stay (HOSPITAL_COMMUNITY): Payer: Medicare HMO

## 2018-09-14 DIAGNOSIS — Z794 Long term (current) use of insulin: Secondary | ICD-10-CM

## 2018-09-14 DIAGNOSIS — E1159 Type 2 diabetes mellitus with other circulatory complications: Secondary | ICD-10-CM

## 2018-09-14 LAB — GLUCOSE, CAPILLARY
Glucose-Capillary: 133 mg/dL — ABNORMAL HIGH (ref 70–99)
Glucose-Capillary: 178 mg/dL — ABNORMAL HIGH (ref 70–99)
Glucose-Capillary: 151 mg/dL — ABNORMAL HIGH (ref 70–99)
Glucose-Capillary: 128 mg/dL — ABNORMAL HIGH (ref 70–99)

## 2018-09-14 LAB — BASIC METABOLIC PANEL
Anion gap: 9 (ref 5–15)
BUN: 10 mg/dL (ref 8–23)
CO2: 26 mmol/L (ref 22–32)
Calcium: 8.9 mg/dL (ref 8.9–10.3)
Chloride: 102 mmol/L (ref 98–111)
Creatinine, Ser: 1.05 mg/dL (ref 0.61–1.24)
GFR calc Af Amer: >60 mL/min (ref >60)
GFR calc non Af Amer: >60 mL/min (ref >60)
Glucose, Bld: 137 mg/dL — ABNORMAL HIGH (ref 70–99)
Potassium: 4.5 mmol/L (ref 3.5–5.1)
Sodium: 137 mmol/L (ref 135–145)

## 2018-09-14 LAB — CBC
HCT: 35.1 % — ABNORMAL LOW (ref 39.0–52.0)
Hemoglobin: 10.7 g/dL — ABNORMAL LOW (ref 13.0–17.0)
MCH: 31.4 pg (ref 26.0–34.0)
MCHC: 30.5 g/dL (ref 30.0–36.0)
MCV: 102.9 fL — AB (ref 80.0–100.0)
Platelets: 425 10*3/uL — ABNORMAL HIGH (ref 150–400)
RBC: 3.41 MIL/uL — ABNORMAL LOW (ref 4.22–5.81)
RDW: 14.7 % (ref 11.5–15.5)
WBC: 10.3 10*3/uL (ref 4.0–10.5)
nRBC: 0 % (ref 0.0–0.2)

## 2018-09-14 MED ORDER — FUROSEMIDE 10 MG/ML IJ SOLN
40.0000 mg | Freq: Once | INTRAMUSCULAR | Status: AC
  Administered 2018-09-14: 40 mg via INTRAVENOUS
  Filled 2018-09-14: qty 4

## 2018-09-14 MED ORDER — ALBUTEROL SULFATE (2.5 MG/3ML) 0.083% IN NEBU: 2.5000 mg | INHALATION_SOLUTION | Freq: Four times a day (QID) | RESPIRATORY_TRACT | Status: DC | PRN

## 2018-09-14 NOTE — Progress Notes (Signed)
STROKE TEAM PROGRESS NOTE   SUBJECTIVE (INTERVAL HISTORY) wife at bedside. Patient in bed awake, alert on 2L [place] 02.  Did not eat much solid food.  Will switch to full liquid diet.  Patient seems to have more short of breath, will give Lasix and breathing treatment.  OBJECTIVE Temp:  [97.6 F (36.4 C)-97.9 F (36.6 C)] 97.7 F (36.5 C) (01/12 1352) Pulse Rate:  [78-89] 89 (01/12 1352) Cardiac Rhythm: Atrial fibrillation (01/12 0700) Resp:  [20-22] 20 (01/12 1352) BP: (140-168)/(64-86) 162/79 (01/12 1352) SpO2:  [96 %-99 %] 97 % (01/12 1352)  Recent Labs  Lab 09/13/18 0558 09/13/18 1140 09/13/18 1620 09/14/18 0613 09/14/18 1137  GLUCAP 133* 185* 118* 133* 178*   Recent Labs  Lab 09/10/18 0452 09/11/18 0625 09/12/18 1322 09/13/18 0500 09/14/18 0629  NA 141 141 139 140 137  K 4.2 4.5 5.1 4.2 4.5  CL 106 106 104 102 102  CO2 24 26 28 29 26   GLUCOSE 150* 193* 158* 126* 137*  BUN 14 16 15 13 10   CREATININE 1.25* 1.36* 1.18 1.20 1.05  CALCIUM 9.0 8.7* 9.2 9.1 8.9   No results for input(s): AST, ALT, ALKPHOS, BILITOT, PROT, ALBUMIN in the last 168 hours. Recent Labs  Lab 09/10/18 0452 09/11/18 0625 09/12/18 0500 09/13/18 0500 09/14/18 0629  WBC 13.3* 13.4* 12.5* 12.9* 10.3  HGB 10.4* 10.3* 9.9* 9.9* 10.7*  HCT 33.4* 33.6* 30.6* 31.9* 35.1*  MCV 105.0* 105.7* 104.1* 104.9* 102.9*  PLT 501* 516* 383 451* 425*   No results for input(s): CKTOTAL, CKMB, CKMBINDEX, TROPONINI in the last 168 hours. No results for input(s): LABPROT, INR in the last 72 hours. No results for input(s): COLORURINE, LABSPEC, PHURINE, GLUCOSEU, HGBUR, BILIRUBINUR, KETONESUR, PROTEINUR, UROBILINOGEN, NITRITE, LEUKOCYTESUR in the last 72 hours.  Invalid input(s): APPERANCEUR     Component Value Date/Time   CHOL 138 08/28/2018 0600   TRIG 171 (H) 08/28/2018 0921   HDL 24 (L) 08/28/2018 0600   CHOLHDL 5.8 08/28/2018 0600   VLDL 33 08/28/2018 0600   LDLCALC 81 08/28/2018 0600   Lab Results   Component Value Date   HGBA1C 7.1 (H) 08/28/2018   No results found for: LABOPIA, COCAINSCRNUR, LABBENZ, AMPHETMU, THCU, LABBARB  No results for input(s): ETH in the last 168 hours.   IMAGING  I have personally reviewed the radiological images below and agree with the radiology interpretations.  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  Severe irregular atherosclerotic disease of the right carotid bifurcation with stenosis of 80% in the bulb region and 30% in the distal ICA.  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 50% stenosis of the left vertebral artery origin.  Severe atherosclerotic disease in both carotid siphon regions. Right siphon stenosis estimated at 50-70%. Reconstituted flow in the distal left siphon and supraclinoid region. 80% supraclinoid stenosis. Flow in both anterior and middle cerebral artery territories. Stenosis of both M1 segments estimated at 50-70%.  I do not identify any large or medium vessel occlusion acutely within the MCA branch vessels.  Severely disease left V4 segment with serial stenoses proximal to the basilar estimated at 70%. Basilar atherosclerotic irregularity without flow limiting stenosis.  Posterior circulation branch vessels do show flow, including right PICA which is supplied by retrograde flow in the distal right vertebral artery.   Mr Brain Wo Contrast 08/28/2018 IMPRESSION:  1. Scattered multifocal acute  ischemic left MCA territory infarcts as above, likely embolic in nature. Possible faint scattered associated petechial hemorrhage without frank hemorrhagic transformation or significant mass effect.  2. Abnormal flow void within the left ICA to its cavernous segment, consistent with known left ICA occlusion.  3. Multiple scattered chronic underlying cortical infarcts involving the bilateral cerebral and right cerebellar  hemispheres.  4. Moderately advanced cerebral atrophy with chronic small vessel ischemic disease.   Ct Abdomen Pelvis W Contrast 08/29/2018 IMPRESSION:  1. Small hematoma in the right inguinal region in the panniculus, probably secondary to the recent angiography.  2. Bilateral consolidative infiltrates in the posterior aspect of both lower lobes with tiny adjacent pleural effusions.  3. Small amount of nonspecific ascites.  4. Slightly prominent gas in the bowel without evidence of bowel obstruction. This probably represents an ileus.   Ir Ct Head Ltd 08/31/2018 IMPRESSION:  Status post attempted endovascular revascularization of occluded left carotid artery at the bulb. 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 Approximately 90% stenosis of the dominant left vertebrobasilar junction distal to the origin of the left posterior-inferior cerebellar artery, and of approximately 70% of the proximal basilar artery. 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  PLAN: Follow-up in the clinic 1 month post discharge   Ct Cerebral Perfusion W Contrast 08/27/2018 IMPRESSION:  155 mL of delayed perfusion in the left MCA territory. No core infarction. Delayed perfusion left MCA territory may be due to severe intracranial atherosclerotic disease as well as occlusion of the left internal carotid artery. Age of the internal carotid artery occlusion is indeterminate. It is possible this delayed perfusion is a chronic or possibly acute finding.   Ct Head Code Stroke Wo Contrast` 08/27/2018 IMPRESSION:  1. No acute finding by CT. 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  2. 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 No acute finding is suspected by CT.    Vas [place] Lower Extremity Arterial Duplex 09/04/2018 Summary:  Right:  A right common femoral artery pseudoaneurysm was identified measuring 2.8 x 1.5 x 2.5 cm, with a neck measuring 1.3cm long x 0.6cm wide. Pseudoaneurysm measures 2.1cm from the surface of the skin. The proximal right common femoral artery exhibits a monophasic waveform, suggestive of possible aortoiliac obstruction. Distal posterior tibial artery exhibits monophasic flow, and the right dorsalis pedis artery exhibits monophasic flow, suggestive of proximal obstruction.     DG Chest Portable 1 View 09/14/2018 IMPRESSION Cardiomegaly and mild interstitial edema.   PHYSICAL EXAM per Dr, Cherlyn Labella:  [97.6 F (36.4 C)-97.9 F (36.6 C)] 97.7 F (36.5 C) (01/12 1352) Pulse Rate:  [78-89] 89 (01/12 1352) Resp:  [20-22] 20 (01/12 1352) BP: (140-168)/(64-86) 162/79 (01/12 1352) SpO2:  [96 %-99 %] 97 % (01/12 1352)  General - Well nourished, well developed elderly Caucasian male, awake alert, but increased shortness of breath  Ophthalmologic - fundi not visualized due to noncooperation.  Cardiovascular - irregularly irregular heart rate and rhythm.  Neuro - awake alert,  able to following simple commands. Able to repeat sentences and spontaneous speech but paucity of speech, not able to have spontaneous speech in sentences partially due to short of breath, able to answer questions with simple words. PERRLA, EOMI, able to track objects on the both sides, inconsistent blinking to visual threat bilaterally.  Facial symmetric.  Tongue midline.  Left upper extremity 4/5.  Right lower extremity and  right upper extremity flaccid.  Left lower extremity proximal 2+/5 and distal 3/5.  DTR 1+, Babinski negative.  Sensation symmetrical, coordination slow but intact on the left finger-to-nose and gait not tested.    ASSESSMENT/PLAN Mr.Jazir Rande Kerstein a 78 y.o.malewith history of HTN, atrial fibrillation on Pradaxa last dose last evening, urethral stricture, cardiomyopathy, peripheral vascular  disease, hypertension, hyperlipidemia, stroke with some balance issues, bilateral lymphedema vitamin D deficiencypresenting with right lower extremity numbness and weaknessand suspected new Lt ICA occlusion.Hedid not receive IV t-PA due to late presentation.  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  MRI head -left MCA and ACA territory scattered small infarcts  CTA H&N - left ICA, right VA occlusion.  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  2D Echo - EF 60 to 65%  LDL- 81  HgbA1c- 7.1  VTE prophylaxis -SCDs  aspirin 81 mg daily and Pradaxa (dabigatran) twice a dayprior to admission, now on Eliquis 5 mg twice daily  Ongoing aggressive stroke risk factor management  Therapy recommendations: SNF  Disposition: Pending  Aspiration pneumonitis  Fever Tmax 101.2->101.3-> afebrile->101.3-> afebrile  Leukocytosis WBC 22.2-15.5-11.4-13.9->16.6-> 19.2->14.7->18.6->12.9  CXR CHF  Off lasix  Sputum culture normal flora  Off Zosyn   UA negative  Respiratory distress  Was intubated on ventilation  Self extubated on 09/01/18  Off sedation  So far on nasal cannula, tolerating  CXR 09/14/2018 - Cardiomegaly and mild interstitial edema.  Still has increased shortness breath, will order Lasix and breathing treatment  Atrial fibrillation, chronic  On Pradaxa PTA, compliant with medication  On metoprolol, rate controlled  On Eliquis and amiodarone po  Rate controlled  BP on the lower side - decrease metoprolol to 25mg  bid  on gentle  hydration - Cre normalized  Hypotension History of hypertension  BP stable   Off neo  On metoprolol - decreased dose to 25mg  bid  Avoid hypotension due to 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 as well as basal artery stenosis  BP goal 120-160  CHF  CXR showed CHF  Fluid overload - off lasix   Anasarca much improved  Cardiology signed off  CXR - 09/14/2018 - Cardiomegaly and mild interstitial edema.  Stable changes of congestive failure and interstitial edema.  DC IV fluid, Lasix 40 IV once and albuterol as needed for SOB  Right femoral A. 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 Arbie Cookey evaluated - "At some point will need repeat duplex to determine if he is spontaneously resolved his false aneurysm".  No intervention needed at this time  Continue dressing drainage  Pseudoaneurysm need to be addressed in the future with thrombin injection  Hyperlipidemia  Lipid lowering medication MPN:TIRW  LDL81, goal < 70  Current lipid lowering medication:none (statin intolerant - abnormal labs)  Add zetia   Continue zetia on d/c  Diabetes  HgbA1c7.1, goal < 7.0  Uncontrolled  Hyperglycemia improved  SSI  CBG monitoring  On Levemir, continue  Intermittent agitation  Likely  due to aphasia, right hemiparesis and discomfort position with obesity  Better with family in room  seroquel to 25mg  QHs  Close monitoring  Other Stroke Risk Factors  Advanced age  Former cigarette smoker - quit  Obesity,Body mass index is 47.79 kg/m., recommend weight loss, diet and exercise as appropriate   Hx stroke/TIA  PVD  OSA - continue CPAP at night  Other Active Problems  Urethral stricture -on Foley  Diffuse anasarca - resolved  Hypokalemia potassium 2.8->3.3, supplement -> 4.2 -> 4.5  Low Magnesium 1.8 -  supplement  Hb -10.3-9.9-9.9 ->10.7  WBCs -13.4-12.5-12.9 ->10.3  Hospital day # 18  Marvel PlanJindong Anastasija Anfinson, MD PhD Stroke Neurology 09/14/2018 6:11 PM    To contact Stroke Continuity provider, please refer to [email or web address]. After hours, contact General Neurology

## 2018-09-14 NOTE — Progress Notes (Signed)
Pt placed on cpap for the night at previous settings 

## 2018-09-15 ENCOUNTER — Inpatient Hospital Stay (HOSPITAL_COMMUNITY): Payer: Medicare HMO

## 2018-09-15 DIAGNOSIS — I4891 Unspecified atrial fibrillation: Secondary | ICD-10-CM | POA: Diagnosis present

## 2018-09-15 DIAGNOSIS — N35919 Unspecified urethral stricture, male, unspecified site: Secondary | ICD-10-CM

## 2018-09-15 DIAGNOSIS — I724 Aneurysm of artery of lower extremity: Secondary | ICD-10-CM | POA: Diagnosis not present

## 2018-09-15 DIAGNOSIS — J690 Pneumonitis due to inhalation of food and vomit: Secondary | ICD-10-CM | POA: Diagnosis not present

## 2018-09-15 DIAGNOSIS — R451 Restlessness and agitation: Secondary | ICD-10-CM | POA: Diagnosis present

## 2018-09-15 DIAGNOSIS — I509 Heart failure, unspecified: Secondary | ICD-10-CM

## 2018-09-15 DIAGNOSIS — J9600 Acute respiratory failure, unspecified whether with hypoxia or hypercapnia: Secondary | ICD-10-CM | POA: Diagnosis not present

## 2018-09-15 DIAGNOSIS — I10 Essential (primary) hypertension: Secondary | ICD-10-CM

## 2018-09-15 DIAGNOSIS — N3000 Acute cystitis without hematuria: Secondary | ICD-10-CM

## 2018-09-15 DIAGNOSIS — G4733 Obstructive sleep apnea (adult) (pediatric): Secondary | ICD-10-CM | POA: Diagnosis present

## 2018-09-15 DIAGNOSIS — N390 Urinary tract infection, site not specified: Secondary | ICD-10-CM | POA: Diagnosis present

## 2018-09-15 DIAGNOSIS — R4701 Aphasia: Secondary | ICD-10-CM | POA: Diagnosis present

## 2018-09-15 DIAGNOSIS — E46 Unspecified protein-calorie malnutrition: Secondary | ICD-10-CM | POA: Diagnosis present

## 2018-09-15 DIAGNOSIS — E785 Hyperlipidemia, unspecified: Secondary | ICD-10-CM

## 2018-09-15 LAB — URINALYSIS, ROUTINE W REFLEX MICROSCOPIC
Bilirubin Urine: NEGATIVE
Glucose, UA: NEGATIVE mg/dL
Ketones, ur: 5 mg/dL — AB
NITRITE: NEGATIVE
Protein, ur: 30 mg/dL — AB
SPECIFIC GRAVITY, URINE: 1.015 (ref 1.005–1.030)
WBC, UA: >50 WBC/hpf — ABNORMAL HIGH (ref 0–5)
pH: 5 (ref 5.0–8.0)

## 2018-09-15 LAB — CBC
HEMATOCRIT: 36.1 % — AB (ref 39.0–52.0)
Hemoglobin: 11.4 g/dL — ABNORMAL LOW (ref 13.0–17.0)
MCH: 32.5 pg (ref 26.0–34.0)
MCHC: 31.6 g/dL (ref 30.0–36.0)
MCV: 102.8 fL — ABNORMAL HIGH (ref 80.0–100.0)
Platelets: 512 10*3/uL — ABNORMAL HIGH (ref 150–400)
RBC: 3.51 MIL/uL — ABNORMAL LOW (ref 4.22–5.81)
RDW: 14.6 % (ref 11.5–15.5)
WBC: 17 10*3/uL — ABNORMAL HIGH (ref 4.0–10.5)
nRBC: 0 % (ref 0.0–0.2)

## 2018-09-15 LAB — BASIC METABOLIC PANEL
Anion gap: 10 (ref 5–15)
BUN: 10 mg/dL (ref 8–23)
CALCIUM: 9.3 mg/dL (ref 8.9–10.3)
CO2: 30 mmol/L (ref 22–32)
Chloride: 99 mmol/L (ref 98–111)
Creatinine, Ser: 1.13 mg/dL (ref 0.61–1.24)
GFR calc non Af Amer: >60 mL/min (ref >60)
Glucose, Bld: 134 mg/dL — ABNORMAL HIGH (ref 70–99)
Potassium: 4.3 mmol/L (ref 3.5–5.1)
Sodium: 139 mmol/L (ref 135–145)

## 2018-09-15 LAB — GLUCOSE, CAPILLARY
Glucose-Capillary: 136 mg/dL — ABNORMAL HIGH (ref 70–99)
Glucose-Capillary: 137 mg/dL — ABNORMAL HIGH (ref 70–99)
Glucose-Capillary: 141 mg/dL — ABNORMAL HIGH (ref 70–99)
Glucose-Capillary: 150 mg/dL — ABNORMAL HIGH (ref 70–99)
Glucose-Capillary: 168 mg/dL — ABNORMAL HIGH (ref 70–99)

## 2018-09-15 MED ORDER — METOPROLOL TARTRATE 50 MG PO TABS
50.0000 mg | ORAL_TABLET | Freq: Two times a day (BID) | ORAL | Status: DC
  Administered 2018-09-15 – 2018-09-16 (×2): 50 mg via ORAL
  Filled 2018-09-15 (×2): qty 1

## 2018-09-15 MED ORDER — SODIUM CHLORIDE 0.9 % IV SOLN
1.0000 g | INTRAVENOUS | Status: DC
  Administered 2018-09-15: 1 g via INTRAVENOUS
  Filled 2018-09-15 (×2): qty 10

## 2018-09-15 NOTE — Progress Notes (Addendum)
STROKE TEAM PROGRESS NOTE   SUBJECTIVE (INTERVAL HISTORY) No new complaints. Hoping to get to SNF today.  OBJECTIVE Temp:  [97.5 F (36.4 C)-97.8 F (36.6 C)] 97.6 F (36.4 C) (01/13 0427) Pulse Rate:  [69-106] 104 (01/13 0427) Cardiac Rhythm: Atrial fibrillation (01/13 0706) Resp:  [20-25] 24 (01/13 0427) BP: (126-164)/(57-88) 158/88 (01/13 0427) SpO2:  [95 %-98 %] 97 % (01/13 0427)  Recent Labs  Lab 09/14/18 1137 09/14/18 1642 09/14/18 2121 09/15/18 0045 09/15/18 0620  GLUCAP 178* 151* 128* 150* 136*   Recent Labs  Lab 09/11/18 0625 09/12/18 1322 09/13/18 0500 09/14/18 0629 09/15/18 0627  NA 141 139 140 137 139  K 4.5 5.1 4.2 4.5 4.3  CL 106 104 102 102 99  CO2 26 28 29 26 30   GLUCOSE 193* 158* 126* 137* 134*  BUN 16 15 13 10 10   CREATININE 1.36* 1.18 1.20 1.05 1.13  CALCIUM 8.7* 9.2 9.1 8.9 9.3   No results for input(s): AST, ALT, ALKPHOS, BILITOT, PROT, ALBUMIN in the last 168 hours. Recent Labs  Lab 09/11/18 0625 09/12/18 0500 09/13/18 0500 09/14/18 0629 09/15/18 0627  WBC 13.4* 12.5* 12.9* 10.3 17.0*  HGB 10.3* 9.9* 9.9* 10.7* 11.4*  HCT 33.6* 30.6* 31.9* 35.1* 36.1*  MCV 105.7* 104.1* 104.9* 102.9* 102.8*  PLT 516* 383 451* 425* 512*   No results for input(s): CKTOTAL, CKMB, CKMBINDEX, TROPONINI in the last 168 hours. No results for input(s): LABPROT, INR in the last 72 hours. No results for input(s): COLORURINE, LABSPEC, PHURINE, GLUCOSEU, HGBUR, BILIRUBINUR, KETONESUR, PROTEINUR, UROBILINOGEN, NITRITE, LEUKOCYTESUR in the last 72 hours.  Invalid input(s): APPERANCEUR     Component Value Date/Time   CHOL 138 08/28/2018 0600   TRIG 171 (H) 08/28/2018 0921   HDL 24 (L) 08/28/2018 0600   CHOLHDL 5.8 08/28/2018 0600   VLDL 33 08/28/2018 0600   LDLCALC 81 08/28/2018 0600   Lab Results  Component Value Date   HGBA1C 7.1 (H) 08/28/2018    IMAGING Ct Head Code Stroke Wo Contrast` 08/27/2018 IMPRESSION:  1. No acute finding by CT. Old  bilateral frontoparietal cortical and subcortical infarctions. 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  2. 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 No acute finding is suspected by CT.   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  Severe irregular atherosclerotic disease of the right carotid bifurcation with stenosis of 80% in the bulb region and 30% in the distal ICA.  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 50% stenosis of the left vertebral artery origin.  Severe atherosclerotic disease in both carotid siphon regions. Right siphon stenosis estimated at 50-70%. Reconstituted flow in the distal left siphon and supraclinoid region. 80% supraclinoid stenosis. Flow in both anterior and middle cerebral artery territories. Stenosis of both M1 segments estimated at 50-70%.  I do not identify any large or medium vessel occlusion acutely within the MCA branch vessels.  Severely disease left V4 segment with serial stenoses proximal to the basilar estimated at 70%. Basilar atherosclerotic irregularity without flow limiting stenosis.  Posterior circulation branch vessels do show flow, including right PICA which is supplied by retrograde flow in the distal right vertebral artery.   Ct Cerebral Perfusion W Contrast 08/27/2018 IMPRESSION:  155 mL of delayed perfusion in the left MCA territory. No core infarction. Delayed perfusion left MCA territory may be due  to severe intracranial atherosclerotic disease as well as occlusion of the left internal carotid artery. Age of the internal carotid artery occlusion is indeterminate. It is possible this delayed perfusion is a chronic or possibly acute finding.   Mr Brain Wo Contrast 08/28/2018 IMPRESSION:  1. Scattered multifocal acute ischemic left MCA territory infarcts as above, likely embolic  in nature. Possible faint scattered associated petechial hemorrhage without frank hemorrhagic transformation or significant mass effect.  2. Abnormal flow void within the left ICA to its cavernous segment, consistent with known left ICA occlusion.  3. 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  4. Moderately advanced cerebral atrophy with chronic small vessel ischemic disease.   Ct Abdomen Pelvis W Contrast 08/29/2018 IMPRESSION:  1. Small hematoma in the right inguinal region in the panniculus, probably secondary to the recent angiography.  2. Bilateral consolidative infiltrates in the posterior aspect of both lower lobes with tiny adjacent pleural effusions.  3. Small amount of nonspecific ascites.  4. Slightly prominent gas in the bowel without evidence of bowel obstruction. This probably represents an ileus.   Ir Ct Head Ltd 08/31/2018 IMPRESSION:  Status post attempted endovascular revascularization of occluded left carotid artery at the bulb. 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 Approximately 90% stenosis of the dominant left vertebrobasilar junction distal to the origin of the left posterior-inferior cerebellar artery, and of approximately 70% of the proximal basilar artery. 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  PLAN: Follow-up in the clinic 1 month post discharge   Vas [place] Lower Extremity Arterial Duplex 09/04/2018 Summary:  Right: A right common femoral artery pseudoaneurysm was identified measuring 2.8 x 1.5 x 2.5 cm, with a neck measuring 1.3cm long x 0.6cm wide. Pseudoaneurysm measures 2.1cm from the surface of the skin. The proximal right common femoral artery exhibits a monophasic waveform, suggestive of possible aortoiliac obstruction. Distal posterior tibial artery exhibits monophasic flow, and the right dorsalis pedis  artery exhibits monophasic flow, suggestive of proximal obstruction.    DG Chest Portable 1 View 09/14/2018 IMPRESSION Cardiomegaly and mild interstitial edema.  Imaging past 24h:   Dg Chest Port 1 View  Result Date: 09/15/2018 CLINICAL DATA:  Shortness of breath, leukocytosis EXAM: PORTABLE CHEST 1 VIEW COMPARISON:  09/14/2018 FINDINGS: Cardiomegaly with vascular congestion and interstitial prominence, likely interstitial edema. This has improved since prior study. Suspect small effusions. No acute bony abnormality. IMPRESSION: Continued interstitial edema, slightly improved since prior study. Suspect small effusions. Electronically Signed   By: Charlett Nose M.D.   On: 09/15/2018 10:55     PHYSICAL EXAM  General - Well nourished, well developed elderly Caucasian male, awake alert, but increased shortness of breath  Ophthalmologic - fundi not visualized due to noncooperation.  Cardiovascular - irregularly irregular heart rate and rhythm.  Respiratory - decreased bases, no wheezing/rhonic noted. No increased WOB  Neuro - awake alert,  able to following simple commands. Able to repeat sentences and spontaneous speech but paucity of speech, not able to have spontaneous speech in sentences partially due to short of breath, able to answer questions with simple words. PERRLA, EOMI, able to track objects on the both sides, inconsistent blinking to visual threat bilaterally.  Facial symmetric.  Tongue midline.  Left upper extremity 4/5.  Right lower extremity and right upper extremity flaccid.  Left lower extremity proximal 2+/5 and distal 3/5.  DTR 1+, Babinski negative.  Sensation symmetrical, coordination slow but  intact on the left finger-to-nose and gait not tested.   ASSESSMENT/PLAN Mr.Darren Frazier a 78 y.o.malewith history of HTN, atrial fibrillation on Pradaxa last dose last evening, urethral stricture, cardiomyopathy, peripheral vascular disease, hypertension,  hyperlipidemia, stroke with some balance issues, bilateral lymphedema vitamin D deficiencypresenting with right lower extremity numbness and weaknessand suspected new Lt ICA occlusion.Hedid not receive IV t-PA due to late presentation.  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  MRI head -left MCA and ACA territory scattered small infarcts  CTA H&N - left ICA, right VA occlusion.  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  2D Echo - EF 60 to 65%  LDL- 81  HgbA1c- 7.1  VTE prophylaxis - Eliquis   aspirin 81 mg daily and Pradaxa (dabigatran) twice a dayprior to admission, now on Eliquis 5 mg twice daily  Ongoing aggressive stroke risk factor management  Therapy recommendations: SNF  Disposition: Pending  Medically ready for d/c  Aspiration pneumonitis  Fever Tmax 101.2->101.3-> afebrile->101.3-> afebrile  Leukocytosis WBC 22.2-15.5-11.4-13.9->16.6-> 19.2->14.7->18.6->12.9->17.0  CXR slightly improved interstitial edema, suspect small effusions  Off lasix  Sputum culture normal flora  Off Zosyn   UA negative  Respiratory distress  Was intubated on ventilation  Self extubated on 09/01/18  Off sedation  So far on nasal cannula, tolerating  CXR - 09/15/2018 - slightly improved mild interstitial edema.  Improved shortness breath after Lasix and breathing treatment yesterday  Continue to monitor  Atrial fibrillation, chronic  On Pradaxa PTA, compliant with medication  On metoprolol, rate controlled  On Eliquis and amiodarone  po  Rate controlled  BP up today from yesterday, increased metoprolol to 50 mg bid  Hypotension History of hypertension  BP stable   Off neo  On metoprolol - increase dose to  50mg  bid  Avoid hypotension due to 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 as well as basal artery stenosis  BP goal 120-160  CHF  CXR showed CHF  Fluid overload - off lasix   Anasarca much improved  Cardiology signed off  CXR - 09/15/2018 - slightly improved mild interstitial edema.  Stable changes of congestive failure and interstitial edema.  DC IV fluid, Lasix 40 IV once and albuterol as needed for SOB  Right femoral A. 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 Arbie CookeyEarly evaluated - "At some point will need repeat duplex to determine if he is spontaneously resolved his false aneurysm".  No intervention needed at this time  Continue dressing drainage  Pseudoaneurysm need to be addressed in the future with thrombin injection  Hyperlipidemia  Lipid lowering medication BJY:NWGNPTA:none  LDL81, goal < 70  Current lipid lowering medication:none (statin intolerant - abnormal labs)  Add zetia   Continue zetia on d/c  Diabetes  HgbA1c7.1, goal < 7.0  Uncontrolled  Hyperglycemia improved  SSI  CBG monitoring  On Levemir, continue  Intermittent agitation  Likely due to aphasia, right hemiparesis and discomfort position with obesity  Better with family in  room  seroquel to 25mg  QHs  Close monitoring  Other Stroke Risk Factors  Advanced age  Former cigarette smoker - quit  Obesity,Body mass index is 47.79 kg/m., recommend weight loss, diet and exercise as appropriate   Hx stroke/TIA  PVD  OSA - continue CPAP at night  Other Active Problems  Urethral stricture -on Foley  Diffuse anasarca - resolved  Hypokalemia, resolved - 2.8  supplement - 4.3   Low Magnesium 1.8 - supplement  Hb -11.4   UTI. WBCs - 10.3->17. Afebrile. UA w/ lg LE, > 50 WBC, many bacteria. Check culture and start abx.   Hospital day # [address]  Annie Main, MSN, APRN, ANVP-BC, AGPCNP-BC Advanced Practice Stroke Nurse [facility] See Amion for Schedule & Pager information 09/15/2018 3:39 PM   ATTENDING NOTE: I reviewed above note and agree with the assessment and plan. Pt was seen and examined.   No acute event overnight, neurologically no significant change.  Still has mild shortness of breath but improved from yesterday after IV Lasix.  Currently off IV fluid however leukocytosis, WBC from 10.3-17.0, UA showed WBC more than 50, put on Rocephin.  Sodium 139 and creatinine 1.13.  Encourage p.o. intake.  Case manager is working on SNF placement.  BP stable, afebrile.  Marvel Plan, MD PhD Stroke Neurology 09/15/2018 6:25 PM     To contact Stroke Continuity provider, please refer to [email or web address]. After hours, contact General Neurology

## 2018-09-15 NOTE — Progress Notes (Signed)
Pt kept taking OFF the CPAP, reminded him to keep it but forgets and takes it off, Sat desat sometimes, had to switch to Nasal cannula

## 2018-09-15 NOTE — Discharge Summary (Addendum)
Stroke Discharge Summary  Patient ID: Darren Frazier   MRN: [id number]      DOB: May 20, 1941  Date of Admission: 08/27/2018 Date of Discharge: 09/17/2018  Attending Physician:  Marvel Plan, MD, Stroke MD Consultant(s):   Roxanne Gates) Corliss Skains, MD (Interventional Neuroradiologist), Gillermina Phy, MD (PCCM), Halina Andreas, MD (cardiology), Early, Kristen Loader, MD (vascular surgery) Dr. 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 Cardiomyopathy (HCC)   . Diabetes mellitus without complication (HCC)   . Dysrhythmia    atrial fibrillation  . GERD (gastroesophageal reflux disease)   . H/O: urethral stricture    self caths occasionally  . History of abdominal aortic aneurysm (AAA)    30/27mm in diameter, stable  . History of kidney stones 2017  . Hyperlipidemia   . Hypertension   . Hypothyroidism   . Morbid obesity with BMI of 40.0-44.9, adult (HCC) 2019  . Peripheral vascular disease (HCC) 2019   lymphadema both extremities, ulceration on left big toe  . Stroke (HCC) 2011   x 2 within 6 months. some numbness over upper arms, balance off  . Tremors of nervous system   . Vitamin D deficiency    Past Surgical History:  Procedure Laterality Date  . APPENDECTOMY  1961  . CATARACT EXTRACTION, BILATERAL Bilateral   . CENTRAL LINE   08/29/2018      . COLONOSCOPY    . IR ANGIO INTRA EXTRACRAN SEL COM CAROTID INNOMINATE UNI R MOD SED  08/27/2018  . IR ANGIO VERTEBRAL SEL SUBCLAVIAN INNOMINATE UNI L MOD SED  08/27/2018  . IR CT HEAD LTD  08/27/2018  . IR PERCUTANEOUS ART THROMBECTOMY/INFUSION INTRACRANIAL INC DIAG ANGIO  08/27/2018  . LOWER EXTREMITY ANGIOGRAPHY Left 03/05/2018   Procedure: LOWER EXTREMITY ANGIOGRAPHY;  Surgeon: Renford Dills, MD;  Location: [facility] INVASIVE CV LAB;  Service: Cardiovascular;  Laterality: Left;  . RADIOLOGY WITH ANESTHESIA N/A 08/27/2018   Procedure: IR WITH ANESTHESIA;  Surgeon: Radiologist, Medication, MD;  Location: [facility] OR;  Service: Radiology;  Laterality: N/A;  . TONSILLECTOMY      Allergies as of 09/17/2018      Reactions   Ace Inhibitors Other (See Comments)   unknown   Omeprazole Diarrhea   Statins Other (See Comments)   Abnormal lab results(pt unsure of what labs)      Medication List    STOP taking these medications   amLODipine 10 MG tablet Commonly known as:  NORVASC   aspirin 81 MG chewable tablet   bisoprolol 10 MG tablet Commonly known as:  ZEBETA   CITRACAL PO   dabigatran 150 MG Caps capsule Commonly known as:  PRADAXA   doxazosin 2 MG tablet Commonly known as:  CARDURA   hydrochlorothiazide 25 MG tablet Commonly known as:  HYDRODIURIL   losartan  100 MG tablet Commonly known as:  COZAAR   meloxicam 15 MG tablet Commonly known as:  MOBIC   metFORMIN 1000 MG tablet Commonly known as:  GLUCOPHAGE   multivitamin capsule   ranitidine 150 MG tablet Commonly known as:  ZANTAC     TAKE these medications   albuterol (2.5 MG/3ML) 0.083% nebulizer solution Commonly known as:  PROVENTIL Take 3 mLs (2.5 mg total) by nebulization every 6 (six) hours as needed for wheezing or shortness of breath.   amiodarone 200 MG tablet Commonly known as:  PACERONE Take 1 tablet (200 mg total) by mouth daily.   apixaban 5 MG Tabs tablet Commonly known as:   ELIQUIS Take 1 tablet (5 mg total) by mouth 2 (two) times daily.   ezetimibe 10 MG tablet Commonly known as:  ZETIA Take 1 tablet (10 mg total) by mouth daily.   famotidine 20 MG tablet Commonly known as:  PEPCID Take 20 mg by mouth 2 (two) times daily.   feeding supplement (GLUCERNA SHAKE) Liqd Take 237 mLs by mouth 2 (two) times daily between meals.   Fish Oil 1200 MG Caps Take 1,200 mg by mouth 3 (three) times daily.   fluticasone 50 MCG/ACT nasal spray Commonly known as:  FLONASE Place 2 sprays into both nostrils daily as needed for allergies or rhinitis.   insulin aspart 100 UNIT/ML injection Commonly known as:  novoLOG Inject 0-20 Units into the skin 3 (three) times daily with meals.   insulin detemir 100 UNIT/ML injection Commonly known as:  LEVEMIR Inject 0.1 mLs (10 Units total) into the skin daily.   levothyroxine 88 MCG tablet Commonly known as:  SYNTHROID, LEVOTHROID Take 88 mcg by mouth daily before breakfast.   metoprolol tartrate 25 MG tablet Commonly known as:  LOPRESSOR Take 1 tablet (25 mg total) by mouth 2 (two) times daily.   QUEtiapine 50 MG tablet Commonly known as:  SEROQUEL Take 1 tablet (50 mg total) by mouth at bedtime.   senna-docusate 8.6-50 MG tablet Commonly known as:  Senokot-S Take 1 tablet by mouth at bedtime as needed for mild constipation.   sulfamethoxazole-trimethoprim 400-80 MG tablet Commonly known as:  BACTRIM,SEPTRA Take 1 tablet by mouth every 12 (twelve) hours for 6 doses.   vitamin C 500 MG tablet Commonly known as:  ASCORBIC ACID Take 500 mg by mouth daily.   Vitamin D3 25 MCG (1000 UT) Caps Take 1,000 Units by mouth daily.       LABORATORY STUDIES CBC    Component Value Date/Time   WBC 14.4 (H) 09/17/2018 0430   RBC 3.64 (L) 09/17/2018 0430   HGB 11.8 (L) 09/17/2018 0430   HGB 14.2 03/07/2013 1849   HCT 36.9 (L) 09/17/2018 0430   HCT 41.5 03/07/2013 1849   PLT 436 (H) 09/17/2018 0430   PLT 212  03/07/2013 1849   MCV 101.4 (H) 09/17/2018 0430   MCV 99 03/07/2013 1849   MCH 32.4 09/17/2018 0430   MCHC 32.0 09/17/2018 0430   RDW 14.6 09/17/2018 0430   RDW 13.2 03/07/2013 1849   LYMPHSABS 2.0 08/28/2018 0600   MONOABS 1.4 (H) 08/28/2018 0600   EOSABS 0.2 08/28/2018 0600   BASOSABS 0.1 08/28/2018 0600   CMP    Component Value Date/Time   NA 138 09/17/2018 0430   NA 137 03/07/2013 1849   K 4.3 09/17/2018 0430   K 3.6 03/07/2013 1849   CL 100 09/17/2018 0430   CL 101 03/07/2013 1849   CO2 29  09/17/2018 0430   CO2 26 03/07/2013 1849   GLUCOSE 137 (H) 09/17/2018 0430   GLUCOSE 135 (H) 03/07/2013 1849   BUN 11 09/17/2018 0430   BUN 18 03/07/2013 1849   CREATININE 1.08 09/17/2018 0430   CREATININE 1.06 03/07/2013 1849   CALCIUM 9.2 09/17/2018 0430   CALCIUM 9.4 03/07/2013 1849   PROT 7.6 03/07/2013 1849   ALBUMIN 3.9 03/07/2013 1849   AST 51 (H) 03/07/2013 1849   ALT 56 03/07/2013 1849   ALKPHOS 89 03/07/2013 1849   BILITOT 0.3 03/07/2013 1849   GFRNONAA >60 09/17/2018 0430   GFRNONAA >60 03/07/2013 1849   GFRAA >60 09/17/2018 0430   GFRAA >60 03/07/2013 1849   COAGS Lab Results  Component Value Date   INR 1.50 08/27/2018   INR 1.1 03/07/2013   Lipid Panel    Component Value Date/Time   CHOL 138 08/28/2018 0600   TRIG 171 (H) 08/28/2018 0921   HDL 24 (L) 08/28/2018 0600   CHOLHDL 5.8 08/28/2018 0600   VLDL 33 08/28/2018 0600   LDLCALC 81 08/28/2018 0600   HgbA1C  Lab Results  Component Value Date   HGBA1C 7.1 (H) 08/28/2018   Urinalysis    Component Value Date/Time   COLORURINE YELLOW 09/15/2018 0847   APPEARANCEUR CLOUDY (A) 09/15/2018 0847   APPEARANCEUR Clear 03/07/2013 1849   LABSPEC 1.015 09/15/2018 0847   LABSPEC 1.011 03/07/2013 1849   PHURINE 5.0 09/15/2018 0847   GLUCOSEU NEGATIVE 09/15/2018 0847   GLUCOSEU Negative 03/07/2013 1849   HGBUR MODERATE (A) 09/15/2018 0847   BILIRUBINUR NEGATIVE 09/15/2018 0847   BILIRUBINUR Negative  03/07/2013 1849   KETONESUR 5 (A) 09/15/2018 0847   PROTEINUR 30 (A) 09/15/2018 0847   NITRITE NEGATIVE 09/15/2018 0847   LEUKOCYTESUR LARGE (A) 09/15/2018 0847   LEUKOCYTESUR 2+ 03/07/2013 1849   Urine Drug Screen No results found for: LABOPIA, COCAINSCRNUR, LABBENZ, AMPHETMU, THCU, LABBARB  Alcohol Level No results found for: [facility]   SIGNIFICANT DIAGNOSTIC STUDIES Ct Angio Head W Or Wo Contrast  Result Date: 09/11/2018 CLINICAL DATA:  Initial evaluation for head trauma, ataxia, history of stroke. EXAM: CT ANGIOGRAPHY HEAD TECHNIQUE: Multidetector CT imaging of the head was performed using the standard protocol during bolus administration of intravenous contrast. Multiplanar CT image reconstructions and MIPs were obtained to evaluate the vascular anatomy. CONTRAST:  75mL ISOVUE-370 IOPAMIDOL (ISOVUE-370) INJECTION 76% COMPARISON:  Prior MRI from 08/28/2018 and CTA from 08/27/2018. FINDINGS: CT HEAD Brain: Moderately advanced cerebral atrophy with chronic small vessel ischemic disease. Multiple chronic cortical infarcts involving the bilateral cerebral hemispheres again seen, stable. Remote lacunar infarct present at the left basal ganglia. Recently identified acute left ACA/MCA territory infarcts grossly stable, better evaluated on recent MRI. No evidence for hemorrhagic transformation or other complication. No new intracranial hemorrhage. No other new acute large vessel territory infarct. No mass lesion, midline shift or mass effect. No hydrocephalus. No extra-axial fluid collection. Vascular: No hyperdense vessel. Extensive calcified atherosclerosis at the skull base. Skull: Scalp soft tissues and calvarium within normal limits. Sinuses: Few small retention cyst noted within the sphenoid sinuses and left maxillary sinus. Paranasal sinuses are otherwise clear. No significant mastoid effusion. Orbits: Globes and orbital soft tissues demonstrate no acute finding. CTA HEAD Anterior circulation: Left ICA  remains occluded through the skull base, stable. Previously identified stenosis about the right ICA bifurcation partially visualized. Right ICA otherwise patent to the skull base. Moderate to advanced atheromatous plaque throughout the right carotid siphon with estimated stenosis of  approximately 50-70%, stable. Distal reconstitution at the supraclinoid left ICA via collateral flow cross the circle-of-Willis. Short-segment severe stenosis of approximately 80% at the supraclinoid left ICA again noted. A1 segments remain patent, with hypoplastic left A1. Patent and normal anterior communicating artery. Anterior cerebral arteries widely patent distally without stenosis or occlusion. Multifocal atheromatous irregularity seen throughout the M1 segments bilaterally, with associated stenosis of the mid left M1 of up to approximately 50-70%. Mild to moderate multifocal narrowing at the right M1 of approximately 30-50%, stable. 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 Retrograde flow within the distal right V4 segment across the vertebrobasilar junction with opacification of the right PICA. Left vertebral artery remains patent as it crosses into the cranial vault. Multifocal atheromatous irregularity with short-segment severe approximate 80-90% stenosis at the distal left V4 segment just distal to the takeoff of the left PICA, stable. Left PICA is perfused. Basilar mildly tortuous and irregular but is patent to its distal aspect without high-grade stenosis. Superior cerebral arteries patent bilaterally. Left vertebral artery primarily supplied via the basilar. Hypoplastic right P1 with prominent right posterior communicating artery. PCAs demonstrate multifocal atheromatous irregularity but are patent to their distal aspects without high-grade stenosis. Venous sinuses: Grossly patent, although not well assessed due to  arterial timing of the contrast bolus. Anatomic variants: None significant. Delayed phase: No abnormal enhancement. IMPRESSION: CT HEAD IMPRESSION 1. Continued normal expected interval evolution of previously identified small volume left cerebral infarcts, grossly stable in size and distribution from previous exams. No evidence for hemorrhagic transformation or other complication. 2. No other new acute intracranial abnormality. 3. Underlying advanced atrophy with chronic small vessel ischemic disease with multifocal chronic ischemic infarcts, stable. CTA HEAD IMPRESSION 1. Stable CTA of the head as compared to 08/27/2018. 2. Occluded left internal carotid artery, with distal reconstitution at the supraclinoid segment. Approximate 80% left supraclinoid stenosis unchanged. 3. Severe atherosclerotic disease of both carotid siphons, with approximately 50-70% stenosis on the right. 4. 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 Retrograde flow within the distal right V4 segment with perfusion of the right PICA. 5. Severe 80-90% stenosis at the distal left V4 segment, stable. 6. Extensive atheromatous irregularity throughout the remainder of the intracranial circulation, relatively stable from previous exam. No new large vessel occlusion or other significant finding. Electronically Signed   By: Rise Mu M.D.   On: 09/11/2018 00:44   Ct Angio Head W Or Wo Contrast  Result Date: 08/27/2018 CLINICAL DATA:  Acute presentation with speech disturbance. EXAM: CT ANGIOGRAPHY HEAD AND NECK TECHNIQUE: Multidetector CT imaging of the head and neck was performed using the standard protocol during bolus administration of intravenous contrast. Multiplanar CT image reconstructions and MIPs were obtained to evaluate the vascular anatomy. Carotid stenosis measurements (when applicable) are obtained utilizing NASCET criteria, using the distal internal carotid diameter as the denominator. CONTRAST:  75mL ISOVUE-370  IOPAMIDOL (ISOVUE-370) INJECTION 76% COMPARISON:  CT same day. FINDINGS: CTA NECK FINDINGS Aortic arch: Aortic atherosclerosis.  No aneurysm or dissection. Right carotid system: Common carotid artery shows some atherosclerotic plaque but is widely patent to the bifurcation. There is soft and calcified plaque affecting the carotid bifurcation and ICA bulb region. There is severe stenosis in the distal bulb region with luminal diameter of 1 mm. The vessel is quite tortuous beyond that but widely patent to the upper cervical region, where there is soft and calcified plaque resulting in minimal  diameter of 3.5 mm. Compared to an expected diameter of 5 mm, stenosis in the bulb is 80% or greater. Stenosis in the upper cervical region is 30%. Left carotid system: Common carotid artery shows atherosclerotic plaque but is sufficiently patent to the bifurcation region. There is calcified and soft plaque at the carotid bifurcation and ICA bulb. There is left internal carotid artery occlusion at the distal bulb. 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 The left vertebral artery shows 50% stenosis at its origin but is sufficiently patent beyond that through the cervical region to the foramen magnum. Skeleton: Mid cervical spondylosis. Other neck: No mass or lymphadenopathy. Upper chest: Interstitial prominence which could go along with fluid overload or mild edema. Review of the MIP images confirms the above findings CTA HEAD FINDINGS Anterior circulation: Left internal carotid artery is occluded without antegrade flow through the skull base. Right internal carotid artery shows atherosclerotic disease in the carotid siphon region with stenosis estimated at 50-70%. The anterior and middle cerebral vessels are patent. There is atherosclerotic irregularity of the M1 segment with stenosis of 50-70%. No acute vessel occlusion is identified. There is a chronic punctate calcified embolus in 1 of the  insular branches, but this was present in 2014. On the left, there is reconstituted flow in the distal siphon. Severe stenosis of the supraclinoid ICA, 80% or greater. Flow is present in the anterior and middle cerebral vessels, secondary to reconstituted flow and flow through communicating arteries. There is 50% stenosis in the M1 segment. I do not see any occluded or missing large or medium vessels in the MCA territory. Posterior circulation: Right vertebral artery shows no antegrade flow at the foramen magnum. There is retrograde flow in the distal right vertebral. Left vertebral artery is patent at the foramen magnum. There is atherosclerotic disease of the V4 segment with stenosis estimated at 70%. There are serial stenoses. The vessel does show flow to the basilar, which shows atherosclerotic irregularity but is patent. Flow is present in the superior cerebellar and posterior cerebral arteries. Venous sinuses: Patent and normal. Anatomic variants: None significant. Delayed phase: No abnormal enhancement. 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 Severe irregular atherosclerotic disease of the right carotid bifurcation with stenosis of 80% in the bulb region and 30% in the distal ICA. 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 50% stenosis of the left vertebral artery origin. Severe atherosclerotic disease in both carotid siphon regions. Right siphon stenosis estimated at 50-70%. Reconstituted flow in the distal left siphon and supraclinoid region. 80% supraclinoid stenosis. Flow in both anterior and middle cerebral artery territories. Stenosis of both M1 segments estimated at 50-70%. I do not identify any large or medium vessel occlusion acutely within the MCA branch vessels. Severely disease left V4 segment with serial stenoses proximal to the basilar estimated at 70%. Basilar atherosclerotic irregularity without flow  limiting stenosis. Posterior circulation branch vessels do show flow, including right PICA which is supplied by retrograde flow in the distal right vertebral artery. Electronically Signed   By: Paulina Fusi M.D.   On: 08/27/2018 13:14   Dg Chest 2 View  Result Date: 08/27/2018 CLINICAL DATA:  Chest pain and shortness of breath.  Cardiomyopathy. EXAM: CHEST - 2 VIEW COMPARISON:  03/07/2013 FINDINGS: Mild cardiomegaly. Aortic atherosclerosis. Diffuse interstitial infiltrates, consistent with pulmonary edema. Mild subsegmental atelectasis also seen in the left upper lobe. No evidence of  pulmonary consolidation or significant pleural effusion. IMPRESSION: Mild cardiomegaly and diffuse interstitial edema, consistent with congestive heart failure. Electronically Signed   By: Myles Rosenthal M.D.   On: 08/27/2018 12:02   Ct Angio Neck W Or Wo Contrast  Result Date: 08/27/2018 CLINICAL DATA:  Acute presentation with speech disturbance. EXAM: CT ANGIOGRAPHY HEAD AND NECK TECHNIQUE: Multidetector CT imaging of the head and neck was performed using the standard protocol during bolus administration of intravenous contrast. Multiplanar CT image reconstructions and MIPs were obtained to evaluate the vascular anatomy. Carotid stenosis measurements (when applicable) are obtained utilizing NASCET criteria, using the distal internal carotid diameter as the denominator. CONTRAST:  75mL ISOVUE-370 IOPAMIDOL (ISOVUE-370) INJECTION 76% COMPARISON:  CT same day. FINDINGS: CTA NECK FINDINGS Aortic arch: Aortic atherosclerosis.  No aneurysm or dissection. Right carotid system: Common carotid artery shows some atherosclerotic plaque but is widely patent to the bifurcation. There is soft and calcified plaque affecting the carotid bifurcation and ICA bulb region. There is severe stenosis in the distal bulb region with luminal diameter of 1 mm. The vessel is quite tortuous beyond that but widely patent to the upper cervical region, where  there is soft and calcified plaque resulting in minimal diameter of 3.5 mm. Compared to an expected diameter of 5 mm, stenosis in the bulb is 80% or greater. Stenosis in the upper cervical region is 30%. Left carotid system: Common carotid artery shows atherosclerotic plaque but is sufficiently patent to the bifurcation region. There is calcified and soft plaque at the carotid bifurcation and ICA bulb. There is left internal carotid artery occlusion at the distal bulb. 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 The left vertebral artery shows 50% stenosis at its origin but is sufficiently patent beyond that through the cervical region to the foramen magnum. Skeleton: Mid cervical spondylosis. Other neck: No mass or lymphadenopathy. Upper chest: Interstitial prominence which could go along with fluid overload or mild edema. Review of the MIP images confirms the above findings CTA HEAD FINDINGS Anterior circulation: Left internal carotid artery is occluded without antegrade flow through the skull base. Right internal carotid artery shows atherosclerotic disease in the carotid siphon region with stenosis estimated at 50-70%. The anterior and middle cerebral vessels are patent. There is atherosclerotic irregularity of the M1 segment with stenosis of 50-70%. No acute vessel occlusion is identified. There is a chronic punctate calcified embolus in 1 of the insular branches, but this was present in 2014. On the left, there is reconstituted flow in the distal siphon. Severe stenosis of the supraclinoid ICA, 80% or greater. Flow is present in the anterior and middle cerebral vessels, secondary to reconstituted flow and flow through communicating arteries. There is 50% stenosis in the M1 segment. I do not see any occluded or missing large or medium vessels in the MCA territory. Posterior circulation: Right vertebral artery shows no antegrade flow at the foramen magnum. There is retrograde flow  in the distal right vertebral. Left vertebral artery is patent at the foramen magnum. There is atherosclerotic disease of the V4 segment with stenosis estimated at 70%. There are serial stenoses. The vessel does show flow to the basilar, which shows atherosclerotic irregularity but is patent. Flow is present in the superior cerebellar and posterior cerebral arteries. Venous sinuses: Patent and normal. Anatomic variants: None significant. Delayed phase: No abnormal enhancement. 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 50% stenosis of the left vertebral artery origin. Severe atherosclerotic disease in both carotid siphon regions. Right siphon stenosis estimated at 50-70%. Reconstituted flow in the distal left siphon and supraclinoid region. 80% supraclinoid stenosis. Flow in both anterior and middle cerebral artery territories. Stenosis of both M1 segments estimated at 50-70%. I do not identify any large or medium vessel occlusion acutely within the MCA branch vessels. Severely disease left V4 segment with serial stenoses proximal to the basilar estimated at 70%. Basilar atherosclerotic irregularity without flow limiting stenosis. Posterior circulation branch vessels do show flow, including right PICA which is supplied by retrograde flow in the distal right vertebral artery. Electronically Signed   By: Paulina Fusi M.D.   On: 08/27/2018 13:14   Mr Brain Wo Contrast  Result Date: 08/28/2018 CLINICAL DATA:  Follow-up examination for acute stroke, right lower extremity weakness. Known left ICA occlusion. EXAM: MRI HEAD WITHOUT CONTRAST TECHNIQUE: Multiplanar, multiecho pulse sequences of the brain and surrounding structures were obtained without  intravenous contrast. COMPARISON:  Prior CTs from 08/27/2018. FINDINGS: Brain: Generalized age-related cerebral atrophy. Patchy and confluent T2/FLAIR hyperintensity within the periventricular and deep white matter both cerebral hemispheres most consistent with chronic microvascular ischemic disease, moderate nature. Scatter remote cortical infarcts involving the posterior left frontoparietal region, right parietal lobe, and right frontal lobe are seen. Small remote right cerebellar infarct. Remote lacunar infarct present at the left caudate head. Scattered chronic hemosiderin staining seen about several of these infarcts. Scattered multifocal foci of restricted diffusion involving the cortical subcortical left frontal, parietal, and temporal occipital regions, compatible with acute ischemic left MCA territory infarcts. Largest area of infarction seen at the high left frontal parietal region and measures 2.5 cm (series 3, image 48). Possible faint petechial hemorrhage about a few of these infarcts without frank hemorrhagic transformation or significant mass effect. Findings likely embolic in nature. No mass lesion, midline shift or mass effect. Mild diffuse ventricular prominence related global parenchymal volume loss without hydrocephalus. No extra-axial fluid collection. Pituitary gland within normal limits. Vascular: Abnormal flow void within the left ICA to its cavernous segment, compatible with previously identified left ICA occlusion. Major intracranial vascular flow voids otherwise maintained at the skull base. Skull and upper cervical spine: Craniocervical junction within normal limits. Upper cervical spine normal. Bone marrow signal intensity within normal limits. No scalp soft tissue abnormality. Sinuses/Orbits: Patient status post bilateral ocular lens replacement. Globes and orbital soft tissues demonstrate no acute finding. Scattered mucosal thickening seen throughout the paranasal sinuses with  superimposed scattered air-fluid levels. Fluid seen within the nasopharynx. Patient likely intubated. Trace bilateral mastoid effusions noted. Inner ear structures normal. Other: None. IMPRESSION: 1. 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 Possible faint scattered associated petechial hemorrhage without frank hemorrhagic transformation or significant mass effect. 2. Abnormal flow void within the left ICA to its cavernous segment, consistent with known left ICA occlusion. 3. 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 4. Moderately advanced cerebral atrophy with chronic small vessel ischemic disease. Electronically Signed   By: Rise Mu M.D.   On: 08/28/2018 15:33   Ct Abdomen Pelvis W Contrast  Result Date: 08/29/2018 CLINICAL DATA:  Abdominal distention.  Bloating. EXAM: CT ABDOMEN AND PELVIS WITH CONTRAST TECHNIQUE: Multidetector CT  imaging of the abdomen and pelvis was performed using the standard protocol following bolus administration of intravenous contrast. CONTRAST:  OMNIPAQUE IOHEXOL 300 MG/ML  SOLN COMPARISON:  Abdominal radiograph dated 08/28/2018 and abdominal ultrasound dated 02/28/2016 FINDINGS: Lower chest: There are bilateral consolidative infiltrates in the lower lobes as well as minimal bilateral pleural effusions. Aortic atherosclerosis. Coronary artery calcifications. Hepatobiliary: Liver parenchyma is normal. No biliary ductal dilatation. Vicarious excretion of contrast in the normal appearing gallbladder. Pancreas: Unremarkable. No pancreatic ductal dilatation or surrounding inflammatory changes. Spleen: Normal in size without focal abnormality. Adrenals/Urinary Tract: Adrenal glands are normal. Normal right kidney. Large chronic cyst in the lower pole of the left kidney with some calcification in the wall. Maximum diameter of the cyst  is approximately 9 cm. Stomach/Bowel: NG tube tip in the body of the stomach. There are few diverticula in the proximal sigmoid portion of the colon. There is moderate air in the colon without significant distention. Small bowel appears normal. Fluid and air in the stomach. Appendix has been removed. Vascular/Lymphatic: There is a poorly defined hematoma in the right inguinal region in the panniculus. This measures approximately 12 x 5 x 2 cm and is probably related to recent angiography. There are several small lymph nodes in the right inguinal region, the largest being 15 mm in diameter. Extensive aortic atherosclerosis. No adenopathy. Reproductive: Dense calcification in the normal-sized prostate gland. Other: There is a small amount of ascites in the abdomen, nonspecific. Small periumbilical hernia containing only fat. Edema in the subcutaneous fat of both flanks, right greater than left. Slight edema in the panniculus and in the lateral aspect of both thighs. Musculoskeletal: No acute abnormalities. Multilevel degenerative disc disease in the thoracic spine and lumbar spine. IMPRESSION: 1. Small hematoma in the right inguinal region in the panniculus, probably secondary to the recent angiography. 2. Bilateral consolidative infiltrates in the posterior aspect of both lower lobes with tiny adjacent pleural effusions. 3. Small amount of nonspecific ascites. 4. Slightly prominent gas in the bowel without evidence of bowel obstruction. This probably represents an ileus. Electronically Signed   By: Francene Boyers M.D.   On: 08/29/2018 17:03   Ir Ct Head Ltd  Result Date: 08/31/2018 CLINICAL DATA:  New onset right-sided weakness with mild word-finding difficulties. 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 EXAM: IR ANGIO INTRA EXTRACRAN SEL COM  CAROTID INNOMINATE UNI RIGHT MOD SED; IR PERCUTANEOUS ART THORMBECTOMY/INFUSION INTRACRANIAL INCLUDE DIAG ANGIO; IR ANGIO VERTEBRAL SEL SUBCLAVIAN INNOMINATE UNI LEFT MOD SED; IR CT HEAD LIMITED COMPARISON:  CT angiogram of the head and neck of 08/27/2018 MEDICATIONS: Heparin 3,000 units IV; . Ancef 2 g IV antibiotic was administered within 1 hour of the procedure ANESTHESIA/SEDATION: General anesthesia CONTRAST:  Isovue 300 approximately 100 mL FLUOROSCOPY TIME:  Fluoroscopy Time: 60 minutes 0 seconds (4327 mGy). COMPLICATIONS: None immediate. TECHNIQUE: Informed written consent was obtained from the patient after a thorough discussion of the procedural risks, benefits and alternatives. All questions were addressed. Maximal Sterile Barrier Technique was utilized including caps, mask, sterile gowns, sterile gloves, sterile drape, hand hygiene and skin antiseptic. A timeout was performed prior to the initiation of the procedure. The right groin was prepped and draped in the usual sterile fashion. Thereafter using modified Seldinger technique, transfemoral access into the right common femoral artery was obtained without  difficulty. Over a 0.035 inch guidewire, a 5 French Pinnacle sheath was inserted. Through this, and also over 0.035 inch guidewire, a 5 [place] JB 1 catheter was advanced to the aortic arch region and selectively positioned in the right common carotid artery, the innominate artery , the left common carotid artery and the left vertebral artery. FINDINGS: The left common carotid artery tear g demonstrates a moderate tortuosity of the proximal left common carotid artery The left external carotid artery proximally has a mild to moderate stenosis. Is branches are otherwise normally opacified The left internal carotid artery at the bulb would demonstrates the complete angiographic occlusion with no evidence of a string sign on the delayed arterial images. The innominate artery arteriogram demonstrates patency  of the right subclavian artery and the right common carotid artery proximally There is no angiographic evidence of the right vertebral artery The right common carotid bifurcation demonstrates the right external carotid artery and its major branches to be widely patent The right internal carotid artery just distal to the bulb has a severe 90% plus stenosis. Distal to this there is a U-shaped configuration of the right internal carotid artery. More distally the vessel is normal caliber The petrous the cavernous segments and supraclinoid segments are widely patent There is mild atherosclerotic irregularity involving the proximal cavernous, and distal cavernous portions of the right internal carotid artery. Right posterior communicating artery is seen opacifying the right posterior cerebral artery and the distal basilar artery. The right middle cerebral artery has approximately 30 40% stenosis of the right M1 segment The trifurcation branches appear to be widely patent into the capillary and venous phases The right anterior cerebral artery A1 segment and distally demonstrate wide patency is into the capillary and venous phases There is simultaneous cross-filling via the anterior communicating artery of the left anterior cerebral artery A2 segment and distally. Also demonstrated is opacification of the left anterior cerebral A1 segment, with flow noted into the left middle cerebral artery distribution. Unopacified blood is seen in the left middle cerebral artery from the collateral circulation. 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 More distally the vessel is seen to opacify to the cranial skull base There is severe focal of focal  stenosis of the left vertebrobasilar junction just distal to the origin of the left posterior-inferior cerebellar artery Additionally there is a approximately 70% stenosis of the proximal basilar artery More distally the basilar artery demonstrates mild caliber irregularity. There is a opacification of the left posterior cerebral artery the superior cerebellar arteries left greater than right, and the anterior-inferior cerebellar arteries Retrograde opacification of the right vertebrobasilar junction to the level of the right posterior-inferior cerebellar arteries is noted. Caliber irregularity of the right posterior-inferior cerebellar artery suggests intracranial arteriosclerosis. Left internal carotid artery occlusion or revascularization attempt The diagnostic JB 1 catheter left common carotid artery was then exchanged over a 0.035 inch 300 cm a 7 French Pinnacle sheath in the right groin. A diagnostic JB 1 catheter were then advanced over the rise exchange guidewire to the descending thoracic aorta. The 7 French Pinnacle sheath was then exchanged over a 0.035 inch 100 cm Amplatz stiff guidewire for a a 8 French 55 cm Brite tip neurovascular sheath using biplane roadmap technique and  constant fluoroscopic guidance. Good good aspiration was obtained from the side port of the neurovascular sheath. This was then connected to continuous heparinized saline infusion. The embolus guidewire was then removed. Through the 8 French neurovascular sheath, over a 0.035 inch roadrunner guidewire, a 5 5 [place] JB 1 catheter was advanced to the recover region and select position in the left common carotid artery. Using biplane technique and constant fluoroscopic guidance, over the of the 5 inch roadrunner guidewire, the 5 [place] JB 1 catheter was advanced to the left external carotid artery and exchanged over a 0.035 inch 300 cm rise exchange guidewire for an 8 French 85 cm flow gap balloon guide catheter which had been prepped  with 50% contrast and 50% heparinized saline infusion The guidewire was removed. Good aspiration obtained from the hub of the 8 [place] Flo guide catheter. Gentle contrast injection demonstrated no evidence of spasms dissections or of intraluminal filling defects. At this time, in a coaxial manner and with constant heparinized saline infusion, a tree approached 1 microcatheter was advanced over a point 0.014 inch soft tip synchro micro guidewire to the distal end of the Flo guide catheter The Flood guide catheter was advanced to just inside the left internal carotid artery bulb. Thereafter, multiple attempts were made to advance the microcatheter over the micro guidewire first with Ace synchro soft and then a regular synchro guidewire. In 016 headliner double angled micro guidewire was also utilized in order to access the occluded left internal carotid artery. It was noted that there was a severe tortuosity at the junction of the proximal and the middle 1/3 of the left internal carotid artery Following multiple attempts it was decided to stop. A control was then performed through the [facility] guide catheter in the left common carotid artery which revealed brisk cross-filling of the left internal carotid artery distal cavernous and supraclinoid segments from the left external carotid artery branches via the ipsilateral ophthalmic artery Flow is noted into the left middle cerebral artery as well Also noted was a retrograde opacification from the left posterior communicating artery into the supraclinoid left ICA and then middle cerebral artery An earlier diagnostic catheter arteriogram of the right common carotid artery he had revealed partial cross-filling via the anterior communicating artery from the right internal carotid artery Given the the collateral circulation, it was decided to stop the procedure. Throughout the procedure, the patient's blood pressure and neurological status remained stable No evidence of  hemodynamic instability was noted No gross mass-effect or move filling defects or extravasation was seen Prior to the attempted revascularization of the left internal carotid artery occlusion, the patient was given 180 mg of Brilinta, and 81 mg of aspirin orally the an orogastric tube. Also the patient was loaded with 3000 units IV heparin in order to facilitate revascularization. The the 8 [place] flu guide catheter was then retrieved and removed. The 8 [place] Brite tip neurovascular sheath was then exchanged over a J-tip guidewire for a in 8 French 35 cm Brite tip neurovascular sheath. This was then connected to continuous heparinized saline infusion An arteriogram performed through this 3 5 cm 8 French Brite tip neurovascular sheath in the abdominal aorta reveal excellent flow through the common iliacs, the external and internal iliacs, and also the visualized portions of the common femoral arteries below the level of the inguinal ligaments. At the end of procedure, a CT of the brain revealed no evidence of gross intracranial hemorrhage,, or mass effect or midline shift. The patient  was left intubated on account of his wound at the time of his intubation The right groin appeared soft without evidence of a hematoma. Distal pulses in the dorsalis pedis, and posterior regions remained dopplerable bilaterally. The patient was then transferred to the neuro ICU for further post stroke management. IMPRESSION: . 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 Approximately 90% stenosis of the dominant left vertebrobasilar junction distal to the origin of the left posterior-inferior cerebellar artery, and of approximately 70% of the proximal basilar artery. 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 PLAN: Follow-up in the clinic 1 month post discharge  Electronically Signed   By: Julieanne Cotton M.D.   On: 08/28/2018 16:08   Ct Cerebral Perfusion W Contrast  Result Date: 08/27/2018 CLINICAL DATA:  Right-sided weakness onset this morning. EXAM: CT PERFUSION BRAIN TECHNIQUE: Multiphase CT imaging of the brain was performed following IV bolus contrast injection. Subsequent parametric perfusion maps were calculated using RAPID software. CONTRAST:  40mL ISOVUE-370 IOPAMIDOL (ISOVUE-370) INJECTION 76% COMPARISON:  CT a head today FINDINGS: CT Brain Perfusion Findings: CBF (<30%) Volume: 0mL Perfusion (Tmax>6.0s) volume: Mismatch Volume: ASPECTS on noncontrast CT Head today, not calculated due to extensive chronic ischemic change. No definite acute infarct on CT. Infarct Core: 0 mL Infarction Location:Delayed perfusion throughout the left MCA territory with apparent sparing of the basal ganglia. There is occlusion of the left internal carotid artery on CTA, age indeterminate. Left middle cerebral artery is diseased and may have delayed perfusion due to collateral circulation and stenosis. IMPRESSION: 155 mL of delayed perfusion in the left MCA territory. No core infarction. Delayed perfusion left MCA territory may be due to severe intracranial atherosclerotic disease as well as occlusion of the left internal carotid artery. Age of the internal carotid artery occlusion is indeterminate. It is possible this delayed perfusion is a chronic or possibly acute finding. Electronically Signed   By: Marlan Palau M.D.   On: 08/27/2018 16:12   Dg Chest Port 1 View  Result Date: 09/15/2018 CLINICAL DATA:  Shortness of breath, leukocytosis EXAM: PORTABLE CHEST 1 VIEW COMPARISON:  09/14/2018 FINDINGS: Cardiomegaly with vascular congestion and interstitial prominence, likely interstitial edema. This has improved since prior study. Suspect small effusions. No acute bony abnormality. IMPRESSION: Continued interstitial edema, slightly improved since prior study.  Suspect small effusions. Electronically Signed   By: Charlett Nose M.D.   On: 09/15/2018 10:55   Dg Chest Port 1 View  Result Date: 09/14/2018 CLINICAL DATA:  History of congestive heart failure EXAM: PORTABLE CHEST 1 VIEW COMPARISON:  Chest radiograph 09/09/2018 FINDINGS: Monitoring leads overlie the patient. Stable cardiomegaly. Low lung volumes. Bilateral interstitial pulmonary opacities. Small bilateral pleural effusions. IMPRESSION: Cardiomegaly and mild interstitial edema. Electronically Signed   By: Annia Belt M.D.   On: 09/14/2018 08:04   Dg Chest Port 1 View  Result Date: 09/09/2018 CLINICAL DATA:  Congestive failure and shortness of breath EXAM: PORTABLE CHEST 1 VIEW COMPARISON:  09/08/2018 FINDINGS: Cardiac shadow remains enlarged. Aortic calcifications are again seen. Changes of congestive failure and interstitial edema are again noted and stable. Some minimal right basilar atelectasis is noted. IMPRESSION: Right basilar atelectasis. Stable changes of congestive failure and interstitial edema. Electronically Signed   By: Alcide Clever M.D.   On: 09/09/2018 10:42   Dg Chest Port 1 View  Result Date: 09/08/2018 CLINICAL DATA:  Short of breath  EXAM: PORTABLE CHEST 1 VIEW COMPARISON:  09/03/2018 FINDINGS: Cardiac enlargement. Improvement in vascular congestion and edema since the prior study. Small pleural effusions and mild bibasilar atelectasis. Central venous catheter tip has been removed IMPRESSION: Improving congestive heart failure and edema. Electronically Signed   By: Marlan Palau M.D.   On: 09/08/2018 13:47   Dg Chest Port 1 View  Result Date: 09/03/2018 CLINICAL DATA:  Shortness of breath. EXAM: PORTABLE CHEST 1 VIEW COMPARISON:  Radiograph September 02, 2018. FINDINGS: Stable cardiomegaly. Atherosclerosis of thoracic aorta is noted. No pneumothorax is noted. Right internal jugular catheter is unchanged in position. Stable bilateral pulmonary edema is noted with minimal pleural  effusions. Bony thorax is unremarkable. IMPRESSION: Stable cardiomegaly with bilateral pulmonary edema and minimal pleural effusions. Aortic Atherosclerosis (ICD10-I70.0). Electronically Signed   By: Lupita Raider, M.D.   On: 09/03/2018 12:33   Dg Chest Port 1 View  Result Date: 09/02/2018 CLINICAL DATA:  Acute respiratory distress. EXAM: PORTABLE CHEST 1 VIEW COMPARISON:  One-view chest x-ray 09/01/2018 FINDINGS: The heart is enlarged. Patient has been extubated. NG tube was removed. A right IJ line remains. Atherosclerotic changes are present at the aortic arch. Diffuse interstitial edema is stable. Bibasilar airspace disease is worse on the left. IMPRESSION: 1. Interval extubation and removal of NG tube. 2. Stable cardiomegaly and edema consistent with congestive heart failure. 3. Aortic atherosclerosis. Electronically Signed   By: Marin Roberts M.D.   On: 09/02/2018 07:29   Dg Chest Port 1 View  Result Date: 09/01/2018 CLINICAL DATA:  Respiratory failure. EXAM: PORTABLE CHEST 1 VIEW COMPARISON:  08/31/2018 FINDINGS: Endotracheal tube terminates 4.2 cm above the carina. Right jugular catheter terminates over the mid SVC. Enteric tube courses into the left upper abdomen. The cardiac silhouette remains enlarged. Patchy airspace opacities throughout both lungs have not significantly changed. IMPRESSION: Bilateral airspace disease without significant interval change. Electronically Signed   By: Sebastian Ache M.D.   On: 09/01/2018 06:54   Dg Chest Port 1 View  Result Date: 08/31/2018 CLINICAL DATA:  CXR for Respiratory failure. Hx of HTN, stroke, AAA, Diabetes, and Cardiomyopathy. EXAM: PORTABLE CHEST 1 VIEW COMPARISON:  08/30/2018 FINDINGS: Endotracheal tube is in place, tip 3.8 centimeters above the carina. Nasogastric tube is in place, tip beyond the gastroesophageal junction and off the image. A RIGHT IJ central line tip overlies the superior vena cava. Patient is rotated towards the LEFT.  The heart is enlarged. There is persistent airspace filling opacity throughout the RIGHT lung, slightly improved. There is significant opacity in the LEFT lung base, consistent with atelectasis or consolidation and increased since 08/27/2018. IMPRESSION: 1. Slight improvement in aeration of the RIGHT lung. 2. Increased opacity in the LEFT lung base. Electronically Signed   By: Norva Pavlov M.D.   On: 08/31/2018 09:52   Dg Chest Port 1 View  Result Date: 08/30/2018 CLINICAL DATA:  Respiratory failure. Cardiomyopathy. EXAM: PORTABLE CHEST 1 VIEW COMPARISON:  08/29/2018 and 08/27/2018 FINDINGS: Endotracheal tube and NG tube and central line appear unchanged and in good position. There has been progression of the bilateral diffuse pulmonary infiltrates, particularly in the right upper lung zone of the left lung base. Heart size and pulmonary vascularity are normal. No discrete effusions. Aortic atherosclerosis. No acute bone abnormality. IMPRESSION: Progressive bilateral pulmonary infiltrates. Aortic Atherosclerosis (ICD10-I70.0). Electronically Signed   By: Francene Boyers M.D.   On: 08/30/2018 08:48   Dg Chest Port 1 View  Result Date: 08/29/2018 CLINICAL DATA:  Central line  placement EXAM: PORTABLE CHEST 1 VIEW COMPARISON:  08/29/2018 FINDINGS: Right central line is been placed with the tip in the SVC. No pneumothorax. Endotracheal tube and NG tube are unchanged. Cardiomegaly with vascular congestion and mild pulmonary edema, stable. IMPRESSION: Right central line tip in the SVC. No pneumothorax. Continued mild CHF. Electronically Signed   By: Charlett Nose M.D.   On: 08/29/2018 11:58   Dg Chest Port 1 View  Result Date: 08/29/2018 CLINICAL DATA:  PT presented to [facility] ED on 08/27/18 with complaints of right lower extremity weakness, dyspnea and chest pressure. Hx of stroke, Peripheral vascualar disease, cardiomyopathy, and A-fib. EXAM: PORTABLE CHEST 1 VIEW COMPARISON:  08/27/2018 FINDINGS: Mild  enlargement of the cardiac silhouette, stable. No mediastinal or hilar masses. There is central vascular congestion. Mild hazy opacity noted in the perihilar regions and medial lung bases bilaterally. No convincing pleural effusion and no pneumothorax. Endotracheal tube tip projects 3.8 cm above the Carina. Nasogastric tube passes below the diaphragm well into the stomach. IMPRESSION: 1. Findings are consistent with mild congestive heart failure with evidence of slight improvement when compared to 08/27/2018. No new abnormalities. 2. Support apparatus is well positioned. Electronically Signed   By: Amie Portland M.D.   On: 08/29/2018 07:26   Dg Chest Port 1 View  Result Date: 08/28/2018 CLINICAL DATA:  Shortness of Breath EXAM: PORTABLE CHEST 1 VIEW COMPARISON:  08/27/2018 FINDINGS: Cardiac shadow is enlarged in size. Aortic calcifications are noted. Endotracheal tube is noted at the level of the carina. This should be withdrawn 2-3 cm. Nasogastric catheter is noted within the stomach. Lungs are well aerated bilaterally with mild interstitial edema similar to that seen on the prior exam. IMPRESSION: Interval intubation with the endotracheal tube at the level of the carina. This should be withdrawn 2-3 cm. Stable edema bilaterally. These results will be called to the ordering clinician or representative by the Radiologist Assistant, and communication documented in the PACS or zVision Dashboard. Electronically Signed   By: Alcide Clever M.D.   On: 08/28/2018 00:19   Dg Abd Portable 1v  Result Date: 08/28/2018 CLINICAL DATA:  Orogastric tube placement. EXAM: PORTABLE ABDOMEN - 1 VIEW COMPARISON:  None. FINDINGS: The patient's enteric tube is seen ending overlying the body of the stomach, with the side port about the fundus of the stomach. The visualized bowel gas pattern is grossly unremarkable. A small left pleural effusion is noted. Increased interstitial markings raise concern for pulmonary edema. Vascular  congestion is noted. The patient's endotracheal tube is seen ending 2-3 cm above the carina. No acute osseous abnormalities are identified. IMPRESSION: 1. Enteric tube seen ending overlying the body of the stomach, with the side port about the fundus of the stomach. 2. Small left pleural effusion noted. Increased interstitial markings raise concern for pulmonary edema. Electronically Signed   By: Roanna Raider M.D.   On: 08/28/2018 22:08   Vas [place] Groin Pseudoaneurysm  Result Date: 09/16/2018  ARTERIAL PSEUDOANEURYSM  Exam: Right groin History: Follow up pseudoaneurysm. Comparison Study: 09/04/2018 Performing Technologist: Jeb Levering RDMS, RVT  Examination Guidelines: A complete evaluation includes B-mode imaging, spectral Doppler, color Doppler, and power Doppler as needed of all accessible portions of each vessel. Bilateral testing is considered an integral part of a complete examination. Limited examinations for reoccurring indications may be performed as noted. +------------+----------+--------+------+----------+ Right DuplexPSV (cm/s)WaveformPlaqueComment(s) +------------+----------+--------+------+----------+ CFA             77    biphasic                 +------------+----------+--------+------+----------+  Prox SFA        65    biphasic                 +------------+----------+--------+------+----------+ Right Vein comments:Patent common femoral vein.  Findings: An area with well defined borders measuring 4.0 cm x 2.2 cm was visualized arising off of the Right CFA with ultrasound characteristics of a pseudoaneurysm. Mixed echos within the structure suggest that it is partially thrombosed with a residual diameter  of 2.5 cm x 1.9 cm. The neck measures approximately 0.6 cm wide and 1.8 cm long.  Diagnosing physician: Coral Else MD Electronically signed by Coral Else MD on 09/16/2018 at 6:30:39 PM.   --------------------------------------------------------------------------------     Final    Vas [place] Lower Ext Arterial Pseudo Injection  Result Date: 09/17/2018  ARTERIAL PSEUDOANEURYSM  Exam: Right groin Performing Technologist: Blanch Media RVS Supporting Technologist: Jeb Levering RDMS, RVT  Examination Guidelines: A complete evaluation includes B-mode imaging, spectral Doppler, color Doppler, and power Doppler as needed of all accessible portions of each vessel. Bilateral testing is considered an integral part of a complete examination. Limited examinations for reoccurring indications may be performed as noted.  Findings: An area with well defined borders measuring 2.1 cm x 2.3 cm was visualized with ultrasound characteristics of a pseudoaneurysm.  Summary: Successful thrombin injection into right groin pseudoaneurysm. Completed by Dr. Chestine Spore.  Post injection the right SFA is patent.    --------------------------------------------------------------------------------    Preliminary    Ir Percutaneous Art Thrombectomy/infusion Intracranial Inc Diag Angio  Result Date: 08/31/2018 CLINICAL DATA:  New onset right-sided weakness with mild word-finding difficulties. 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 EXAM: IR ANGIO INTRA EXTRACRAN SEL COM CAROTID INNOMINATE UNI RIGHT MOD SED; IR PERCUTANEOUS ART THORMBECTOMY/INFUSION INTRACRANIAL INCLUDE DIAG ANGIO; IR ANGIO VERTEBRAL SEL SUBCLAVIAN INNOMINATE UNI LEFT MOD SED; IR CT HEAD LIMITED COMPARISON:  CT angiogram of the head and neck of 08/27/2018 MEDICATIONS: Heparin 3,000 units IV; . Ancef 2 g IV antibiotic was administered within 1 hour of the procedure ANESTHESIA/SEDATION: General anesthesia CONTRAST:  Isovue 300 approximately 100 mL FLUOROSCOPY TIME:  Fluoroscopy Time: 60 minutes 0 seconds (4327 mGy). COMPLICATIONS: None immediate. TECHNIQUE: Informed written consent was  obtained from the patient after a thorough discussion of the procedural risks, benefits and alternatives. All questions were addressed. Maximal Sterile Barrier Technique was utilized including caps, mask, sterile gowns, sterile gloves, sterile drape, hand hygiene and skin antiseptic. A timeout was performed prior to the initiation of the procedure. The right groin was prepped and draped in the usual sterile fashion. Thereafter using modified Seldinger technique, transfemoral access into the right common femoral artery was obtained without difficulty. Over a 0.035 inch guidewire, a 5 French Pinnacle sheath was inserted. Through this, and also over 0.035 inch guidewire, a 5 [place] JB 1 catheter was advanced to the aortic arch region and selectively positioned in the right common carotid artery, the innominate artery , the left common carotid artery and the left vertebral artery. FINDINGS: The left common carotid artery tear g demonstrates a moderate tortuosity of the proximal left common carotid artery The left external carotid artery proximally has a mild to moderate stenosis. Is branches are otherwise normally opacified The left internal carotid artery at the bulb would demonstrates the complete angiographic occlusion with no evidence of a string  sign on the delayed arterial images. The innominate artery arteriogram demonstrates patency of the right subclavian artery and the right common carotid artery proximally There is no angiographic evidence of the right vertebral artery The right common carotid bifurcation demonstrates the right external carotid artery and its major branches to be widely patent The right internal carotid artery just distal to the bulb has a severe 90% plus stenosis. Distal to this there is a U-shaped configuration of the right internal carotid artery. More distally the vessel is normal caliber The petrous the cavernous segments and supraclinoid segments are widely patent There is mild  atherosclerotic irregularity involving the proximal cavernous, and distal cavernous portions of the right internal carotid artery. Right posterior communicating artery is seen opacifying the right posterior cerebral artery and the distal basilar artery. The right middle cerebral artery has approximately 30 40% stenosis of the right M1 segment The trifurcation branches appear to be widely patent into the capillary and venous phases The right anterior cerebral artery A1 segment and distally demonstrate wide patency is into the capillary and venous phases There is simultaneous cross-filling via the anterior communicating artery of the left anterior cerebral artery A2 segment and distally. Also demonstrated is opacification of the left anterior cerebral A1 segment, with flow noted into the left middle cerebral artery distribution. Unopacified blood is seen in the left middle cerebral artery from the collateral circulation. 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 More distally the vessel is seen to opacify to the cranial skull base There is severe focal of focal stenosis of the left vertebrobasilar junction just distal to the origin of the left posterior-inferior cerebellar artery Additionally there is a approximately 70% stenosis of the proximal basilar artery More distally the basilar artery demonstrates mild caliber irregularity. There is a opacification of the left posterior cerebral artery the superior cerebellar arteries left greater than right, and the anterior-inferior cerebellar arteries Retrograde opacification of the right vertebrobasilar junction to the level of the right posterior-inferior cerebellar arteries is noted.  Caliber irregularity of the right posterior-inferior cerebellar artery suggests intracranial arteriosclerosis. Left internal carotid artery occlusion or revascularization attempt The diagnostic JB 1 catheter left common carotid artery was then exchanged over a 0.035 inch 300 cm a 7 French Pinnacle sheath in the right groin. A diagnostic JB 1 catheter were then advanced over the rise exchange guidewire to the descending thoracic aorta. The 7 French Pinnacle sheath was then exchanged over a 0.035 inch 100 cm Amplatz stiff guidewire for a a 8 French 55 cm Brite tip neurovascular sheath using biplane roadmap technique and constant fluoroscopic guidance. Good good aspiration was obtained from the side port of the neurovascular sheath. This was then connected to continuous heparinized saline infusion. The embolus guidewire was then removed. Through the 8 French neurovascular sheath, over a 0.035 inch roadrunner guidewire, a 5 5 [place] JB 1 catheter was advanced to the recover region and select position in the left common carotid artery. Using biplane technique and constant fluoroscopic guidance, over the of the 5 inch roadrunner guidewire, the 5 [place] JB 1 catheter was advanced to the left external carotid artery and exchanged over a 0.035 inch 300 cm rise exchange guidewire for an 8 French 85 cm flow gap balloon  guide catheter which had been prepped with 50% contrast and 50% heparinized saline infusion The guidewire was removed. Good aspiration obtained from the hub of the 8 [place] Flo guide catheter. Gentle contrast injection demonstrated no evidence of spasms dissections or of intraluminal filling defects. At this time, in a coaxial manner and with constant heparinized saline infusion, a tree approached 1 microcatheter was advanced over a point 0.014 inch soft tip synchro micro guidewire to the distal end of the Flo guide catheter The Flood guide catheter was advanced to just inside the left internal carotid artery  bulb. Thereafter, multiple attempts were made to advance the microcatheter over the micro guidewire first with Ace synchro soft and then a regular synchro guidewire. In 016 headliner double angled micro guidewire was also utilized in order to access the occluded left internal carotid artery. It was noted that there was a severe tortuosity at the junction of the proximal and the middle 1/3 of the left internal carotid artery Following multiple attempts it was decided to stop. A control was then performed through the [facility] guide catheter in the left common carotid artery which revealed brisk cross-filling of the left internal carotid artery distal cavernous and supraclinoid segments from the left external carotid artery branches via the ipsilateral ophthalmic artery Flow is noted into the left middle cerebral artery as well Also noted was a retrograde opacification from the left posterior communicating artery into the supraclinoid left ICA and then middle cerebral artery An earlier diagnostic catheter arteriogram of the right common carotid artery he had revealed partial cross-filling via the anterior communicating artery from the right internal carotid artery Given the the collateral circulation, it was decided to stop the procedure. Throughout the procedure, the patient's blood pressure and neurological status remained stable No evidence of hemodynamic instability was noted No gross mass-effect or move filling defects or extravasation was seen Prior to the attempted revascularization of the left internal carotid artery occlusion, the patient was given 180 mg of Brilinta, and 81 mg of aspirin orally the an orogastric tube. Also the patient was loaded with 3000 units IV heparin in order to facilitate revascularization. The the 8 [place] flu guide catheter was then retrieved and removed. The 8 [place] Brite tip neurovascular sheath was then exchanged over a J-tip guidewire for a in 8 French 35 cm Brite tip neurovascular  sheath. This was then connected to continuous heparinized saline infusion An arteriogram performed through this 3 5 cm 8 French Brite tip neurovascular sheath in the abdominal aorta reveal excellent flow through the common iliacs, the external and internal iliacs, and also the visualized portions of the common femoral arteries below the level of the inguinal ligaments. At the end of procedure, a CT of the brain revealed no evidence of gross intracranial hemorrhage,, or mass effect or midline shift. The patient was left intubated on account of his wound at the time of his intubation The right groin appeared soft without evidence of a hematoma. Distal pulses in the dorsalis pedis, and posterior regions remained dopplerable bilaterally. The patient was then transferred to the neuro ICU for further post stroke management. IMPRESSION: . 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 Approximately 90% stenosis of the dominant left vertebrobasilar junction distal to the origin of the left posterior-inferior cerebellar artery, and of approximately 70% of the proximal basilar artery. 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 PLAN:  Follow-up in the clinic 1 month post discharge Electronically Signed   By: Julieanne Cotton M.D.   On: 08/28/2018 16:08   Ct Head Code Stroke Wo Contrast`  Result Date: 08/27/2018 CLINICAL DATA:  Code stroke. Slurred speech beginning 3 weeks ago. Right-sided weakness beginning today. EXAM: CT HEAD WITHOUT CONTRAST TECHNIQUE: Contiguous axial images were obtained from the base of the skull through the vertex without intravenous contrast. COMPARISON:  CT 03/07/2013 FINDINGS: Brain: The brainstem and cerebellum appear normal by CT. Cerebral hemispheres show age related volume loss. There are old cortical and subcortical infarctions  in the right frontal lobe, left parietal vertex, left frontal lobe, and left frontoparietal vertex. The show atrophy and encephalomalacia with gliosis. There is no finding of acute or subacute infarction on this CT. There is old lacunar infarction in the left caudate. Vascular: There is atherosclerotic calcification of the major vessels at the base of the brain. No evidence of acute hyperdense vessel. Skull: Negative Sinuses/Orbits: Clear/normal Other: None ASPECTS ([place] Stroke Program Early CT Score) Aspects is difficult in this patient with old infarctions. I do not suspect an acute insult. IMPRESSION: 1. No acute finding by CT. 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 2. 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 No acute finding is suspected by CT. 3. These results were called by telephone at the time of interpretation on 08/27/2018 at 11:47 am to Dr. Daryel November , who verbally acknowledged these results. Electronically Signed   By: Paulina Fusi M.D.   On: 08/27/2018 11:49   Vas [place] Lower Extremity Arterial Duplex  Result Date: 09/04/2018 LOWER EXTREMITY ARTERIAL DUPLEX STUDY  Current ABI: Not obtained Limitations: Cerebral angiogram 08/27/18, now with bleeding, pain, and bruising              at groin stick site. Performing Technologist: Gertie Fey MHA, RDMS, RVT, RDCS  Examination Guidelines: A complete evaluation includes B-mode imaging, spectral Doppler, color Doppler, and power Doppler as needed of all accessible portions of each vessel. Bilateral testing is considered an integral part of a complete examination. Limited examinations for reoccurring indications may be performed as noted.  Right Duplex Findings: +----------+--------+-----+--------+----------+--------+           PSV cm/sRatioStenosisWaveform  Comments  +----------+--------+-----+--------+----------+--------+ CFA Prox  102                  monophasic         +----------+--------+-----+--------+----------+--------+ CFA Distal90                   monophasic         +----------+--------+-----+--------+----------+--------+ PTA Distal32                   monophasic         +----------+--------+-----+--------+----------+--------+ DP        44                   monophasic         +----------+--------+-----+--------+----------+--------+  Summary: Right: A right common femoral artery pseudoaneurysm was identified measuring 2.8 x 1.5 x 2.5 cm, with a neck measuring 1.3cm long x 0.6cm wide. Pseudoaneurysm measures 2.1cm from the surface of the skin. The proximal right common femoral artery exhibits a monophasic waveform, suggestive of possible aortoiliac obstruction. Distal posterior tibial artery exhibits monophasic flow, and the right dorsalis pedis artery exhibits monophasic flow, suggestive of proximal obstruction.  See table(s) above for measurements and observations. Electronically signed by Gretta Began MD on 09/04/2018 at 5:42:19 PM.    Final    Ir Angio Intra Extracran Sel Com Carotid Innominate Uni R Mod Sed  Result Date: 08/31/2018 CLINICAL DATA:  New onset right-sided weakness with mild word-finding difficulties. 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 EXAM: IR ANGIO INTRA EXTRACRAN SEL COM CAROTID INNOMINATE UNI RIGHT MOD SED; IR PERCUTANEOUS ART THORMBECTOMY/INFUSION INTRACRANIAL INCLUDE DIAG ANGIO; IR ANGIO VERTEBRAL SEL SUBCLAVIAN INNOMINATE UNI LEFT MOD SED; IR CT HEAD LIMITED COMPARISON:  CT angiogram of the head and neck of 08/27/2018 MEDICATIONS: Heparin 3,000 units IV; . Ancef 2 g IV antibiotic was administered within 1 hour of the procedure ANESTHESIA/SEDATION:  General anesthesia CONTRAST:  Isovue 300 approximately 100 mL FLUOROSCOPY TIME:  Fluoroscopy Time: 60 minutes 0 seconds (4327 mGy). COMPLICATIONS: None immediate. TECHNIQUE: Informed written consent was obtained from the patient after a thorough discussion of the procedural risks, benefits and alternatives. All questions were addressed. Maximal Sterile Barrier Technique was utilized including caps, mask, sterile gowns, sterile gloves, sterile drape, hand hygiene and skin antiseptic. A timeout was performed prior to the initiation of the procedure. The right groin was prepped and draped in the usual sterile fashion. Thereafter using modified Seldinger technique, transfemoral access into the right common femoral artery was obtained without difficulty. Over a 0.035 inch guidewire, a 5 French Pinnacle sheath was inserted. Through this, and also over 0.035 inch guidewire, a 5 [place] JB 1 catheter was advanced to the aortic arch region and selectively positioned in the right common carotid artery, the innominate artery , the left common carotid artery and the left vertebral artery. FINDINGS: The left common carotid artery tear g demonstrates a moderate tortuosity of the proximal left common carotid artery The left external carotid artery proximally has a mild to moderate stenosis. Is branches are otherwise normally opacified The left internal carotid artery at the bulb would demonstrates the complete angiographic occlusion with no evidence of a string sign on the delayed arterial images. The innominate artery arteriogram demonstrates patency of the right subclavian artery and the right common carotid artery proximally There is no angiographic evidence of the right vertebral artery The right common carotid bifurcation demonstrates the right external carotid artery and its major branches to be widely patent The right internal carotid artery just distal to the bulb has a severe 90% plus stenosis. Distal to this there is a  U-shaped configuration of the right internal carotid artery. More distally the vessel is normal caliber The petrous the cavernous segments and supraclinoid segments are widely patent There is mild atherosclerotic irregularity involving the proximal cavernous, and distal cavernous portions of the right internal carotid artery. Right posterior communicating artery is seen opacifying the right posterior cerebral artery and the distal basilar artery. The right middle cerebral artery has approximately 30 40% stenosis of the right M1 segment The trifurcation branches appear to be widely patent into the capillary and venous phases The right anterior cerebral artery A1 segment and distally demonstrate wide patency is into the capillary and venous phases There is simultaneous cross-filling via the anterior communicating artery of the left anterior cerebral artery A2 segment and distally. Also demonstrated is opacification of the left anterior cerebral A1 segment, with flow noted into the left middle cerebral artery distribution.  Unopacified blood is seen in the left middle cerebral artery from the collateral circulation. 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 More distally the vessel is seen to opacify to the cranial skull base There is severe focal of focal stenosis of the left vertebrobasilar junction just distal to the origin of the left posterior-inferior cerebellar artery Additionally there is a approximately 70% stenosis of the proximal basilar artery More distally the basilar artery demonstrates mild caliber irregularity. There is a opacification of the left posterior cerebral artery the superior cerebellar arteries left greater  than right, and the anterior-inferior cerebellar arteries Retrograde opacification of the right vertebrobasilar junction to the level of the right posterior-inferior cerebellar arteries is noted. Caliber irregularity of the right posterior-inferior cerebellar artery suggests intracranial arteriosclerosis. Left internal carotid artery occlusion or revascularization attempt The diagnostic JB 1 catheter left common carotid artery was then exchanged over a 0.035 inch 300 cm a 7 French Pinnacle sheath in the right groin. A diagnostic JB 1 catheter were then advanced over the rise exchange guidewire to the descending thoracic aorta. The 7 French Pinnacle sheath was then exchanged over a 0.035 inch 100 cm Amplatz stiff guidewire for a a 8 French 55 cm Brite tip neurovascular sheath using biplane roadmap technique and constant fluoroscopic guidance. Good good aspiration was obtained from the side port of the neurovascular sheath. This was then connected to continuous heparinized saline infusion. The embolus guidewire was then removed. Through the 8 French neurovascular sheath, over a 0.035 inch roadrunner guidewire, a 5 5 [place] JB 1 catheter was advanced to the recover region and select position in the left common carotid artery. Using biplane technique and constant fluoroscopic guidance, over the of the 5 inch roadrunner guidewire, the 5 [place] JB 1 catheter was advanced to the left external carotid artery and exchanged over a 0.035 inch 300 cm rise exchange guidewire for an 8 French 85 cm flow gap balloon guide catheter which had been prepped with 50% contrast and 50% heparinized saline infusion The guidewire was removed. Good aspiration obtained from the hub of the 8 [place] Flo guide catheter. Gentle contrast injection demonstrated no evidence of spasms dissections or of intraluminal filling defects. At this time, in a coaxial manner and with constant heparinized saline infusion, a tree approached 1 microcatheter was  advanced over a point 0.014 inch soft tip synchro micro guidewire to the distal end of the Flo guide catheter The Flood guide catheter was advanced to just inside the left internal carotid artery bulb. Thereafter, multiple attempts were made to advance the microcatheter over the micro guidewire first with Ace synchro soft and then a regular synchro guidewire. In 016 headliner double angled micro guidewire was also utilized in order to access the occluded left internal carotid artery. It was noted that there was a severe tortuosity at the junction of the proximal and the middle 1/3 of the left internal carotid artery Following multiple attempts it was decided to stop. A control was then performed through the [facility] guide catheter in the left common carotid artery which revealed brisk cross-filling of the left internal carotid artery distal cavernous and supraclinoid segments from the left external carotid artery branches via the ipsilateral ophthalmic artery Flow is noted into the left  middle cerebral artery as well Also noted was a retrograde opacification from the left posterior communicating artery into the supraclinoid left ICA and then middle cerebral artery An earlier diagnostic catheter arteriogram of the right common carotid artery he had revealed partial cross-filling via the anterior communicating artery from the right internal carotid artery Given the the collateral circulation, it was decided to stop the procedure. Throughout the procedure, the patient's blood pressure and neurological status remained stable No evidence of hemodynamic instability was noted No gross mass-effect or move filling defects or extravasation was seen Prior to the attempted revascularization of the left internal carotid artery occlusion, the patient was given 180 mg of Brilinta, and 81 mg of aspirin orally the an orogastric tube. Also the patient was loaded with 3000 units IV heparin in order to facilitate revascularization. The the  8 [place] flu guide catheter was then retrieved and removed. The 8 [place] Brite tip neurovascular sheath was then exchanged over a J-tip guidewire for a in 8 French 35 cm Brite tip neurovascular sheath. This was then connected to continuous heparinized saline infusion An arteriogram performed through this 3 5 cm 8 French Brite tip neurovascular sheath in the abdominal aorta reveal excellent flow through the common iliacs, the external and internal iliacs, and also the visualized portions of the common femoral arteries below the level of the inguinal ligaments. At the end of procedure, a CT of the brain revealed no evidence of gross intracranial hemorrhage,, or mass effect or midline shift. The patient was left intubated on account of his wound at the time of his intubation The right groin appeared soft without evidence of a hematoma. Distal pulses in the dorsalis pedis, and posterior regions remained dopplerable bilaterally. The patient was then transferred to the neuro ICU for further post stroke management. IMPRESSION: . 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 Approximately 90% stenosis of the dominant left vertebrobasilar junction distal to the origin of the left posterior-inferior cerebellar artery, and of approximately 70% of the proximal basilar artery. 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 PLAN: Follow-up in the clinic 1 month post discharge Electronically Signed   By: Julieanne CottonSanjeev  Deveshwar M.D.   On: 08/28/2018 16:08   Ir Angio Vertebral Sel Subclavian Innominate Uni L Mod Sed  Result Date: 08/31/2018 CLINICAL DATA:  New onset right-sided weakness with mild word-finding difficulties. Abnormal CT angiogram of the head and neck revealing non flow limiting filling defect in the left middle cerebral artery M1 segment, and also acute  occlusion of the left internal carotid artery at the bulb. EXAM: IR ANGIO INTRA EXTRACRAN SEL COM CAROTID INNOMINATE UNI RIGHT MOD SED; IR PERCUTANEOUS ART THORMBECTOMY/INFUSION INTRACRANIAL INCLUDE DIAG ANGIO; IR ANGIO VERTEBRAL SEL SUBCLAVIAN INNOMINATE UNI LEFT MOD SED; IR CT HEAD LIMITED COMPARISON:  CT angiogram of the head and neck of 08/27/2018 MEDICATIONS: Heparin 3,000 units IV; . Ancef 2 g IV antibiotic was administered within 1 hour of the procedure ANESTHESIA/SEDATION: General anesthesia CONTRAST:  Isovue 300 approximately 100 mL FLUOROSCOPY TIME:  Fluoroscopy Time: 60 minutes 0 seconds (4327 mGy). COMPLICATIONS: None immediate. TECHNIQUE: Informed written consent was obtained from the patient after a thorough discussion of the procedural risks, benefits and alternatives. All questions were addressed. Maximal Sterile Barrier Technique was utilized including caps, mask, sterile gowns, sterile gloves, sterile drape, hand hygiene and skin antiseptic. A timeout was performed prior to the initiation  of the procedure. The right groin was prepped and draped in the usual sterile fashion. Thereafter using modified Seldinger technique, transfemoral access into the right common femoral artery was obtained without difficulty. Over a 0.035 inch guidewire, a 5 French Pinnacle sheath was inserted. Through this, and also over 0.035 inch guidewire, a 5 [place] JB 1 catheter was advanced to the aortic arch region and selectively positioned in the right common carotid artery, the innominate artery , the left common carotid artery and the left vertebral artery. FINDINGS: The left common carotid artery tear g demonstrates a moderate tortuosity of the proximal left common carotid artery The left external carotid artery proximally has a mild to moderate stenosis. Is branches are otherwise normally opacified The left internal carotid artery at the bulb would demonstrates the complete angiographic occlusion with no evidence of a  string sign on the delayed arterial images. The innominate artery arteriogram demonstrates patency of the right subclavian artery and the right common carotid artery proximally There is no angiographic evidence of the right vertebral artery The right common carotid bifurcation demonstrates the right external carotid artery and its major branches to be widely patent The right internal carotid artery just distal to the bulb has a severe 90% plus stenosis. Distal to this there is a U-shaped configuration of the right internal carotid artery. More distally the vessel is normal caliber The petrous the cavernous segments and supraclinoid segments are widely patent There is mild atherosclerotic irregularity involving the proximal cavernous, and distal cavernous portions of the right internal carotid artery. Right posterior communicating artery is seen opacifying the right posterior cerebral artery and the distal basilar artery. The right middle cerebral artery has approximately 30 40% stenosis of the right M1 segment The trifurcation branches appear to be widely patent into the capillary and venous phases The right anterior cerebral artery A1 segment and distally demonstrate wide patency is into the capillary and venous phases There is simultaneous cross-filling via the anterior communicating artery of the left anterior cerebral artery A2 segment and distally. Also demonstrated is opacification of the left anterior cerebral A1 segment, with flow noted into the left middle cerebral artery distribution. Unopacified blood is seen in the left middle cerebral artery from the collateral circulation. 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  More distally the vessel is seen to opacify to the cranial skull base There is severe focal of focal stenosis of the left vertebrobasilar junction just distal to the origin of the left posterior-inferior cerebellar artery Additionally there is a approximately 70% stenosis of the proximal basilar artery More distally the basilar artery demonstrates mild caliber irregularity. There is a opacification of the left posterior cerebral artery the superior cerebellar arteries left greater than right, and the anterior-inferior cerebellar arteries Retrograde opacification of the right vertebrobasilar junction to the level of the right posterior-inferior cerebellar arteries is noted. Caliber irregularity of the right posterior-inferior cerebellar artery suggests intracranial arteriosclerosis. Left internal carotid artery occlusion or revascularization attempt The diagnostic JB 1 catheter left common carotid artery was then exchanged over a 0.035 inch 300 cm a 7 French Pinnacle sheath in the right groin. A diagnostic JB 1 catheter were then advanced over the rise exchange guidewire to the descending thoracic aorta. The 7  [place] Pinnacle sheath was then exchanged over a 0.035 inch 100 cm Amplatz stiff guidewire for a a 8 French 55 cm Brite tip neurovascular sheath using biplane roadmap technique and constant fluoroscopic guidance. Good good aspiration was obtained from the side port of the neurovascular sheath. This was then connected to continuous heparinized saline infusion. The embolus guidewire was then removed. Through the 8 French neurovascular sheath, over a 0.035 inch roadrunner guidewire, a 5 5 [place] JB 1 catheter was advanced to the recover region and select position in the left common carotid artery. Using biplane technique and constant fluoroscopic guidance, over the of the 5 inch roadrunner guidewire, the 5 [place] JB 1 catheter was advanced to the left external carotid artery and exchanged over a 0.035 inch 300 cm  rise exchange guidewire for an 8 French 85 cm flow gap balloon guide catheter which had been prepped with 50% contrast and 50% heparinized saline infusion The guidewire was removed. Good aspiration obtained from the hub of the 8 [place] Flo guide catheter. Gentle contrast injection demonstrated no evidence of spasms dissections or of intraluminal filling defects. At this time, in a coaxial manner and with constant heparinized saline infusion, a tree approached 1 microcatheter was advanced over a point 0.014 inch soft tip synchro micro guidewire to the distal end of the Flo guide catheter The Flood guide catheter was advanced to just inside the left internal carotid artery bulb. Thereafter, multiple attempts were made to advance the microcatheter over the micro guidewire first with Ace synchro soft and then a regular synchro guidewire. In 016 headliner double angled micro guidewire was also utilized in order to access the occluded left internal carotid artery. It was noted that there was a severe tortuosity at the junction of the proximal and the middle 1/3 of the left internal carotid artery Following multiple attempts it was decided to stop. A control was then performed through the [facility] guide catheter in the left common carotid artery which revealed brisk cross-filling of the left internal carotid artery distal cavernous and supraclinoid segments from the left external carotid artery branches via the ipsilateral ophthalmic artery Flow is noted into the left middle cerebral artery as well Also noted was a retrograde opacification from the left posterior communicating artery into the supraclinoid left ICA and then middle cerebral artery An earlier diagnostic catheter arteriogram of the right common carotid artery he had revealed partial cross-filling via the anterior communicating artery from the right internal carotid artery Given the the collateral circulation, it was decided to stop the procedure. Throughout the  procedure, the patient's blood pressure and neurological status remained stable No evidence of hemodynamic instability was noted No gross mass-effect or move filling defects or extravasation was seen Prior to the attempted revascularization of the left internal carotid artery occlusion, the patient was given 180 mg of Brilinta, and 81 mg of aspirin orally the an orogastric tube. Also the patient was loaded with 3000 units IV heparin in order to facilitate revascularization. The the 8 [place] flu guide catheter was then retrieved and removed. The 8 [place] Brite tip neurovascular sheath was then exchanged over a J-tip guidewire for a in 8 French 35 cm Brite tip neurovascular sheath. This was then connected to continuous heparinized saline infusion An arteriogram performed through this 3 5 cm 8 French Brite tip neurovascular sheath in the abdominal aorta reveal excellent flow through the common iliacs, the external and internal iliacs, and also the visualized portions of the common femoral arteries below  the level of the inguinal ligaments. At the end of procedure, a CT of the brain revealed no evidence of gross intracranial hemorrhage,, or mass effect or midline shift. The patient was left intubated on account of his wound at the time of his intubation The right groin appeared soft without evidence of a hematoma. Distal pulses in the dorsalis pedis, and posterior regions remained dopplerable bilaterally. The patient was then transferred to the neuro ICU for further post stroke management. IMPRESSION: . 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 Approximately 90% stenosis of the dominant left vertebrobasilar junction distal to the origin of the left posterior-inferior cerebellar artery, and of approximately 70% of the proximal basilar artery. Scattered mild-to-moderate  intracranial arteriosclerosis. PLAN: Follow-up in the clinic 1 month post discharge Electronically Signed   By: Julieanne Cotton M.D.   On: 08/28/2018 16:08    2D Echocardiogram  - Left ventricle: The cavity size was normal. Systolic function was normal. The estimated ejection fraction was in the range of 60% to 65%. Images were inadequate for LV wall motion assessment. The study is not technically sufficient to allow evaluation of LV diastolic function. - Aortic valve: Valve mobility was moderately restricted. Suboptimal image quality limited Doppler assessment of aortic valve systolic gradient. There was no regurgitation. - Mitral valve: Moderately calcified annulus. There was trivial regurgitation. - Left atrium: The atrium was moderately dilated. - Tricuspid valve: There was trivial regurgitation. Impressions:  Suboptimal image quality. Cannot definitely comment on cardiac source of emboli. Consider TEE if clinically indicated for source of emboli as well as aortic valve systolic gradient.     HISTORY OF PRESENT ILLNESS Darren Frazier is a 78 y.o. male  with PMH of HTN, atrial fibrillation on Pradaxa with last dose last evening, urethral stricture, cardiomyopathy, peripheral vascular disease, hypertension, hyperlipidemia, stroke with some balance issues, bilateral lymphedema vitamin D deficiency came into [facility] for evaluation of right lower extremity numbness and weakness with last known well around 8:30 AM today 08/27/2018. He was evaluated by telemedicine neurology, given NIH stroke scale of 4 and vascular imaging was recommended.  There was some delay in obtaining vascular imaging; vascular imaging revealed a left ICA occlusion.  No known history of ICA occlusion.  Dr. Wilford Corner was called regarding potential transfer for IR evaluation.  CT perfusion study done showed a large penumbra with no core in the left cerebral hemisphere. He was transferred to [facility]  for further work-up and diagnostic angiogram and possible thrombectomy. Premorbid modified Rankin scale (mRS): 2 from peripheral vascular disease. Taken to IR where revascularization of L ICA was attempted. He was admitted to the neuro ICU for further evaluation and treatment.    HOSPITAL COURSE Darren Frazier a 78 y.o.malewith history of HTN, atrial fibrillation on Pradaxa last dose last evening, urethral stricture, cardiomyopathy, peripheral vascular disease, hypertension, hyperlipidemia, stroke with some balance issues, bilateral lymphedema vitamin D deficiencypresenting with right lower extremity numbness and weaknessand suspected new Lt ICA occlusion.Hedid not receive IV t-PA due to late presentation.  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  MRI head -left MCA and ACA territory scattered small infarcts  CTA H&N -  left ICA, right VA occlusion.  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  2D Echo - EF 60 to 65%  LDL- 81  HgbA1c- 7.1  aspirin 81 mg daily and Pradaxa (dabigatran) twice a dayprior to admission, now on Eliquis 5 mg twice daily  Therapy recommendations: SNF  Disposition: SNF  Aspiration pneumonitis  Fever Tmax 101.2->101.3-> afebrile->101.3-> afebrile  Leukocytosis 17.3  CXR slightly improved interstitial edema, suspect small effusions  Not on lasix PTA  Sputum culture normal flora  Off Zosyn   UA negative  Respiratory distress  Was intubated on ventilation  Self extubated on 09/01/18  Off sedation  So  far on nasal cannula, tolerating  CXR - 09/15/2018 - slightly improved mild interstitial edema.  Improved shortness breath after Lasix and breathing   Will spontaneous desat low 80s without distress and return to normal high 90s without intervention   Continue to monitor  Atrial fibrillation, chronic  On Pradaxa PTA, compliant with medication; stopped in hospital  On metoprolol 25 mg bid, rate controlled  On Eliquis and amiodarone po  Had 2.16 sec pause. Had a second recurrence. Cardiology came to see patient. Recommendations given and followed  F/u with out-patient cardiology as previously scheduled appt.   Hypotension History of hypertension  BP stable   Off neo  On metoprolol - increase dose to  50mg  bid  Avoid hypotension due to 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 as well as basal artery stenosis  BP goal 120-160  CHF  CXR showed CHF  Fluid overload - off lasix   Anasarca much improved  Cardiology signed off  CXR - 09/15/2018 - slightly improved mild interstitial edema.  Stable changes of congestive failure and interstitial edema.  Right femoral A. 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 Arbie Cookey evaluated - "At some point will need repeat duplex to determine if he is spontaneously resolved his false aneurysm".  Repeat US showed 4.0x2.2cm R CFA pseudoaneurysm w/ mixed echos suggesting thrombosed w/ residual diameter  Darren Frazier (for Dr. Arbie Cookey VV0 performed thrombin injection vs stitch into pseudoaneurysm for stabilization 09/16/2017  Continue dressing drainage  Hyperlipidemia  Lipid lowering medication ZOX:WRUE  LDL81, goal < 70  Current lipid lowering medication:none (statin intolerant - abnormal labs)  Add zetia   Continue zetia on d/c  Diabetes  HgbA1c7.1, goal <  7.0  Uncontrolled  Hyperglycemia improved  CBG monitoring  On Levemir, continue  Intermittent agitation, resolved  Likely due to aphasia, right hemiparesis and discomfort position with obesity  Better with family in room  seroquel 25mg  QHs  Other Stroke Risk Factors  Advanced age  Former cigarette smoker - quit  Morbid Obesity,Body mass index is 47.79 kg/m., recommend weight loss, diet and exercise as appropriate   Hx stroke/TIA  PVD  OSA - tried to use CPAP but pt did not tolerate. Uses sleep pillow at home. Allow wife to bring to SNF for use at SNF at night  Other Active Problems  Urethral stricture - foley out  Diffuse anasarca - resolved  Hypokalemia, resolved - 2.8 supplement - 4.3  Low Magnesium 1.8 - supplement  Hb -11.4   UTI. WBCs -  10.3->17. Afebrile. UA w/ lg LE, > 50 WBC, many bacteria. Check culture. Treated with 1 doses Rocephin in hospital. Discussed with pharmacy. Change to Bactrim bid x 3 days.   Malnutrition d/t acute illness. Glucerna.    DISCHARGE EXAM Blood pressure 129/69, pulse 100, temperature 98.4 F (36.9 C), temperature source Axillary, resp. rate 20, height 5\' 9"  (1.753 m), weight 130.5 kg, SpO2 90 %. General- Well nourished, well developed elderly Caucasian male, awake alert  Ophthalmologic- fundi not visualized due to noncooperation.  Cardiovascular - irregularly irregular heart rate and rhythm.  Respiratory - decreased bases, no wheezing/rhonchi noted. No increased WOB. sats will drop to low 80s then raise to high 90s over 1-2 mins without distress or efforts to correct. Patient on 2 L [place].   Neuro - awake alert, able to following simple commands. Able to repeat sentences and answer questions. Decreased spontaneous speech,  paucity of speech. Becomes short of breath during communication. Speaking in short sentences. Can name. PERRLA, EOMI, able to track objects on the both sides, inconsistent blinking to visual  threat on the R. Facial symmetric. Tongue midline. Left upper extremity 4/5. Right lower extremity and right upper extremity flaccid. Left lower extremity proximal 2+/5 and distal 3/5. DTR 1+, Babinski negative. Sensation symmetrical, coordination slow but intact on the left finger-to-nose. Gait not tested.    Discharge Diet   Heart healthy / carb modified thin liquids - though he prefers a full liquid diet and that is what we were giving him  DISCHARGE PLAN  Disposition:  Discharge to skilled nursing facility for ongoing PT, OT and ST.   Eliquis (apixaban) daily for secondary stroke prevention.  Ongoing risk factor control by Primary Care Physician at time of discharge  CPAP at hs  Follow-up Lynnea Ferrier, MD  in 2 weeks or MD at SNF for primary care  Follow-up Dr. Darrold Junker, pts cardiologist prior to admission, in 4 weeks  Follow-up in [facility] in 4 weeks, office to schedule an appointment.   60 minutes were spent preparing discharge.  Arline Asp, MSN, APRN, FNP-C Advanced Practice Stroke Nurse [facility] See Amion for Schedule & Pager information 09/17/2018 12:06 PM    ATTENDING NOTE: I reviewed above note and agree with the assessment and plan. Pt was seen and examined.   Patient saw in the morning.  Lying in bed, sleepy after procedure, but arousable, able to tell me his name but disorientated on time or place.  Still has sleep apnea, but patient refused CPAP at night in the hospital.  Discussed with wife yesterday, wife will bring nasal pillow to him in SNF to see if he would like to wear.  Had a cardiac consultation for 2 episode of prolonged no pause on telemetry, agree with decrease metoprolol to 25 mg twice daily.  No new recommendation at this time.  Patient is ready for rehab at discharge.  He will be follow-up with stroke neurology clinic Dr. Pearlean Brownie in [place] in 4 weeks.  Marvel Plan, MD PhD Stroke  Neurology 09/17/2018 6:25 PM

## 2018-09-15 NOTE — Progress Notes (Signed)
Physical Therapy Treatment Patient Details Name: Darren Frazier MRN: [id number] DOB: 10-24-1940 Today's Date: 09/15/2018    History of Present Illness 78 yo male  PMH including Cardiomyopathy (HCC), DM, Dysrhythmia, AAA, Hyperlipidemia, HTN, Hypothyroidism, Morbid obesity PVD, Stroke (2011), Tremors of nervous system. Presented to [facility] 12/25 with Rt leg numbness and weakness.  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  He was transferred to [facility].  He was intubated for neuro-IR intervention 12/25, self-extubated 12/30. MRI head left MCA/ACA territory scattered small acute infarcts; multiple scattered chronic underlying cortical infarcts involving the bilateral cerebral and right cerebellar hemispheres; moderately advanced cerebral atrophy with chronic small vessel.     PT Comments    Patient seen for mobility progression. Patient today with limited participation and increased physical assist for all mobility. Total A +2 for all bed level mobility with cueing provided throughout for sequencing to direct patient to increase participation/assist with mobility. Attempted sit to stand at bedside with STEDY, however unable to clear buttocks from bed.  Will continue to recommend SNF at discharge due to current functional status.   Follow Up Recommendations  SNF     Equipment Recommendations  Wheelchair (measurements PT);Wheelchair cushion (measurements PT);Other (comment)(hoyer and hospital bed if he returns home)    Recommendations for Other Services       Precautions / Restrictions Precautions Precautions: Fall Restrictions Weight Bearing Restrictions: No    Mobility  Bed Mobility Overal bed mobility: Needs Assistance Bed Mobility: Supine to Sit;Sit to Supine     Supine to sit: Total assist;+2 for physical assistance Sit to supine: Total assist;+2 for physical assistance   General bed mobility comments: Total A+2 for all bed level mobility; little participation by patient even with  consistent verbal cueing; able to sit EOB x ~5 min with 1 UE support and Min-Max A for sitting balance  Transfers                 General transfer comment: Attempted sit to stand with STEDY; patient with little effort; even with Max/totalA +2 unable to clear buttocks frmo bedside  Ambulation/Gait                 Stairs             Wheelchair Mobility    Modified Rankin (Stroke Patients Only) Modified Rankin (Stroke Patients Only) Pre-Morbid Rankin Score: No significant disability Modified Rankin: Severe disability     Balance Overall balance assessment: Needs assistance Sitting-balance support: Single extremity supported;Feet supported Sitting balance-Leahy Scale: Poor Sitting balance - Comments: up to Max A for seated balance Postural control: Posterior lean                                  Cognition Arousal/Alertness: Awake/alert;Lethargic Behavior During Therapy: Flat affect Overall Cognitive Status: Impaired/Different from baseline Area of Impairment: Orientation;Memory;Following commands;Safety/judgement;Awareness                 Orientation Level: Disoriented to;Place;Situation   Memory: Decreased recall of precautions;Decreased short-term memory Following Commands: Follows one step commands with increased time;Follows one step commands inconsistently Safety/Judgement: Decreased awareness of safety;Decreased awareness of deficits   Problem Solving: Slow processing;Decreased initiation;Difficulty sequencing;Requires verbal cues;Requires tactile cues General Comments: difficulty following commands; requires consistent cueing for task initiation and completion; little participation by pateint       Exercises      General Comments  Pertinent Vitals/Pain Pain Assessment: No/denies pain    Home Living                      Prior Function            PT Goals (current goals can now be found in the care  plan section) Acute Rehab PT Goals Patient Stated Goal: none stated PT Goal Formulation: With patient/family Time For Goal Achievement: 09/17/18 Potential to Achieve Goals: Fair Progress towards PT goals: Progressing toward goals    Frequency    Min 3X/week      PT Plan Current plan remains appropriate    Co-evaluation              AM-PAC PT "6 Clicks" Mobility   Outcome Measure  Help needed turning from your back to your side while in a flat bed without using bedrails?: Total Help needed moving from lying on your back to sitting on the side of a flat bed without using bedrails?: Total Help needed moving to and from a bed to a chair (including a wheelchair)?: Total Help needed standing up from a chair using your arms (e.g., wheelchair or bedside chair)?: Total Help needed to walk in hospital room?: Total Help needed climbing 3-5 steps with a railing? : Total 6 Click Score: 6    End of Session Equipment Utilized During Treatment: Oxygen;Gait belt Activity Tolerance: Patient tolerated treatment well Patient left: in bed;with call bell/phone within reach;with bed alarm set Nurse Communication: Mobility status;Need for lift equipment PT Visit Diagnosis: Unsteadiness on feet (R26.81);Other abnormalities of gait and mobility (R26.89);Muscle weakness (generalized) (M62.81);Difficulty in walking, not elsewhere classified (R26.2)     Time: 1350-1411 PT Time Calculation (min) (ACUTE ONLY): 21 min  Charges:  $Therapeutic Activity: 8-22 mins                     Kipp Laurence, PT, DPT Supplemental Physical Therapist 09/15/18 3:05 PM Pager: [phone] Office: [phone]

## 2018-09-15 NOTE — Progress Notes (Signed)
Dr Roda Shutters aware of Vital Signs throughout the day and has been adjusting medicines accordingly

## 2018-09-16 ENCOUNTER — Inpatient Hospital Stay (HOSPITAL_COMMUNITY): Payer: Medicare HMO

## 2018-09-16 DIAGNOSIS — I724 Aneurysm of artery of lower extremity: Secondary | ICD-10-CM

## 2018-09-16 DIAGNOSIS — I455 Other specified heart block: Secondary | ICD-10-CM | POA: Diagnosis not present

## 2018-09-16 LAB — URINE CULTURE

## 2018-09-16 LAB — BASIC METABOLIC PANEL
Anion gap: 10 (ref 5–15)
BUN: 10 mg/dL (ref 8–23)
CO2: 29 mmol/L (ref 22–32)
Calcium: 9.1 mg/dL (ref 8.9–10.3)
Chloride: 99 mmol/L (ref 98–111)
Creatinine, Ser: 1.01 mg/dL (ref 0.61–1.24)
GFR calc Af Amer: >60 mL/min (ref >60)
GFR calc non Af Amer: >60 mL/min (ref >60)
Glucose, Bld: 145 mg/dL — ABNORMAL HIGH (ref 70–99)
Potassium: 4.3 mmol/L (ref 3.5–5.1)
Sodium: 138 mmol/L (ref 135–145)

## 2018-09-16 LAB — CBC
HCT: 39 % (ref 39.0–52.0)
Hemoglobin: 12.2 g/dL — ABNORMAL LOW (ref 13.0–17.0)
MCH: 32 pg (ref 26.0–34.0)
MCHC: 31.3 g/dL (ref 30.0–36.0)
MCV: 102.4 fL — ABNORMAL HIGH (ref 80.0–100.0)
Platelets: 429 10*3/uL — ABNORMAL HIGH (ref 150–400)
RBC: 3.81 MIL/uL — ABNORMAL LOW (ref 4.22–5.81)
RDW: 14.6 % (ref 11.5–15.5)
WBC: 17.3 10*3/uL — ABNORMAL HIGH (ref 4.0–10.5)
nRBC: 0.1 % (ref 0.0–0.2)

## 2018-09-16 LAB — GLUCOSE, CAPILLARY
Glucose-Capillary: 146 mg/dL — ABNORMAL HIGH (ref 70–99)
Glucose-Capillary: 117 mg/dL — ABNORMAL HIGH (ref 70–99)
Glucose-Capillary: 126 mg/dL — ABNORMAL HIGH (ref 70–99)

## 2018-09-16 MED ORDER — SENNOSIDES-DOCUSATE SODIUM 8.6-50 MG PO TABS: 1.0000 | ORAL_TABLET | Freq: Every evening | ORAL | Status: AC | PRN

## 2018-09-16 MED ORDER — INSULIN DETEMIR 100 UNIT/ML ~~LOC~~ SOLN: 10.0000 [IU] | Freq: Every day | SUBCUTANEOUS | 11 refills | Status: DC

## 2018-09-16 MED ORDER — METOPROLOL TARTRATE 50 MG PO TABS: 50.0000 mg | ORAL_TABLET | Freq: Two times a day (BID) | ORAL | Status: DC

## 2018-09-16 MED ORDER — AMIODARONE HCL 200 MG PO TABS: 200.0000 mg | ORAL_TABLET | Freq: Every day | ORAL | Status: AC

## 2018-09-16 MED ORDER — INSULIN ASPART 100 UNIT/ML ~~LOC~~ SOLN: 0.0000 [IU] | Freq: Three times a day (TID) | SUBCUTANEOUS | 11 refills | Status: DC

## 2018-09-16 MED ORDER — THROMBIN FOR PERCUTANEOUS TREATMENT OF PSEUDOANEURYSM (5000UNITS/10ML)
Freq: Once | PERCUTANEOUS | Status: DC
  Filled 2018-09-16: qty 1

## 2018-09-16 MED ORDER — QUETIAPINE FUMARATE 50 MG PO TABS: 50.0000 mg | ORAL_TABLET | Freq: Every day | ORAL | 0 refills | Status: DC

## 2018-09-16 MED ORDER — SULFAMETHOXAZOLE-TRIMETHOPRIM 400-80 MG PO TABS: 1.0000 | ORAL_TABLET | Freq: Two times a day (BID) | ORAL | Status: DC

## 2018-09-16 MED ORDER — QUETIAPINE FUMARATE 50 MG PO TABS: 50.0000 mg | ORAL_TABLET | Freq: Every day | ORAL | Status: DC

## 2018-09-16 MED ORDER — ALBUTEROL SULFATE (2.5 MG/3ML) 0.083% IN NEBU: 2.5000 mg | INHALATION_SOLUTION | Freq: Four times a day (QID) | RESPIRATORY_TRACT | 12 refills | Status: AC | PRN

## 2018-09-16 MED ORDER — EZETIMIBE 10 MG PO TABS: 10.0000 mg | ORAL_TABLET | Freq: Every day | ORAL | Status: AC

## 2018-09-16 MED ORDER — LIDOCAINE HCL (PF) 1 % IJ SOLN
30.0000 mL | Freq: Once | INTRAMUSCULAR | Status: DC
  Filled 2018-09-16: qty 30

## 2018-09-16 MED ORDER — GLUCERNA SHAKE PO LIQD: 237.0000 mL | Freq: Two times a day (BID) | ORAL | 0 refills | Status: DC

## 2018-09-16 MED ORDER — APIXABAN 5 MG PO TABS: 5.0000 mg | ORAL_TABLET | Freq: Two times a day (BID) | ORAL | Status: AC

## 2018-09-16 MED ORDER — METOPROLOL TARTRATE 25 MG PO TABS
25.0000 mg | ORAL_TABLET | Freq: Two times a day (BID) | ORAL | Status: DC
  Administered 2018-09-16: 25 mg via ORAL
  Filled 2018-09-16: qty 1

## 2018-09-16 MED ORDER — SULFAMETHOXAZOLE-TRIMETHOPRIM 400-80 MG PO TABS
1.0000 | ORAL_TABLET | Freq: Two times a day (BID) | ORAL | Status: DC
  Administered 2018-09-16 (×2): 1 via ORAL
  Filled 2018-09-16 (×4): qty 1

## 2018-09-16 NOTE — Progress Notes (Signed)
Tele called and pt had a 2.16 second pause, doctor is aware

## 2018-09-16 NOTE — Progress Notes (Addendum)
STROKE TEAM PROGRESS NOTE   SUBJECTIVE (INTERVAL HISTORY) Wife at bedside. Planned d/c this afternoon when I rounded. She was aware I thought there may be a delay until tomorrow, for her not to worry if he did not go today. Thrombin injection scheduled for tomorrow changed to today for anticipated d/c to SNF. SNF unable to take late today but can take in am w/ procedure today. Dr. Myra Gianotti to perform.   OBJECTIVE Temp:  [97.9 F (36.6 C)-98.4 F (36.9 C)] 97.9 F (36.6 C) (01/14 1114) Pulse Rate:  [80-159] 80 (01/14 0737) Cardiac Rhythm: Atrial fibrillation (01/14 0700) Resp:  [20-24] 20 (01/14 1114) BP: (101-174)/(74-151) 101/79 (01/14 1114) SpO2:  [95 %-98 %] 96 % (01/14 1114)  Recent Labs  Lab 09/15/18 0620 09/15/18 1131 09/15/18 1705 09/15/18 2250 09/16/18 1333  GLUCAP 136* 168* 141* 137* 146*   Recent Labs  Lab 09/12/18 1322 09/13/18 0500 09/14/18 0629 09/15/18 0627 09/16/18 0520  NA 139 140 137 139 138  K 5.1 4.2 4.5 4.3 4.3  CL 104 102 102 99 99  CO2 28 29 26 30 29   GLUCOSE 158* 126* 137* 134* 145*  BUN 15 13 10 10 10   CREATININE 1.18 1.20 1.05 1.13 1.01  CALCIUM 9.2 9.1 8.9 9.3 9.1   No results for input(s): AST, ALT, ALKPHOS, BILITOT, PROT, ALBUMIN in the last 168 hours. Recent Labs  Lab 09/12/18 0500 09/13/18 0500 09/14/18 0629 09/15/18 0627 09/16/18 0520  WBC 12.5* 12.9* 10.3 17.0* 17.3*  HGB 9.9* 9.9* 10.7* 11.4* 12.2*  HCT 30.6* 31.9* 35.1* 36.1* 39.0  MCV 104.1* 104.9* 102.9* 102.8* 102.4*  PLT 383 451* 425* 512* 429*   No results for input(s): CKTOTAL, CKMB, CKMBINDEX, TROPONINI in the last 168 hours. No results for input(s): LABPROT, INR in the last 72 hours. Recent Labs    09/15/18 0847  COLORURINE YELLOW  LABSPEC 1.015  PHURINE 5.0  GLUCOSEU NEGATIVE  HGBUR MODERATE*  BILIRUBINUR NEGATIVE  KETONESUR 5*  PROTEINUR 30*  NITRITE NEGATIVE  LEUKOCYTESUR LARGE*       Component Value Date/Time   CHOL 138 08/28/2018 0600   TRIG 171  (H) 08/28/2018 0921   HDL 24 (L) 08/28/2018 0600   CHOLHDL 5.8 08/28/2018 0600   VLDL 33 08/28/2018 0600   LDLCALC 81 08/28/2018 0600   Lab Results  Component Value Date   HGBA1C 7.1 (H) 08/28/2018    IMAGING Ct Head Code Stroke Wo Contrast` 08/27/2018 IMPRESSION:  1. No acute finding by CT. 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  2. 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 No acute finding is suspected by CT.   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  Severe irregular atherosclerotic disease of the right carotid bifurcation with stenosis of 80% in the bulb region and 30% in the distal ICA.  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 50% stenosis of the left vertebral artery origin.  Severe atherosclerotic disease in both carotid siphon regions. Right siphon stenosis estimated at 50-70%. Reconstituted flow in the distal left siphon and supraclinoid region. 80% supraclinoid stenosis. Flow in both anterior and middle cerebral artery territories. Stenosis of both M1 segments estimated at 50-70%.  I do not identify any large or medium vessel occlusion acutely within the MCA branch vessels.  Severely disease left V4 segment  with serial stenoses proximal to the basilar estimated at 70%. Basilar atherosclerotic irregularity without flow limiting stenosis.  Posterior circulation branch vessels do show flow, including right PICA which is supplied by retrograde flow in the distal right vertebral artery.   Ct Cerebral Perfusion W Contrast 08/27/2018 IMPRESSION:  155 mL of delayed perfusion in the left MCA territory. No core infarction. Delayed perfusion left MCA territory may be due to severe intracranial atherosclerotic disease as well as occlusion of the left  internal carotid artery. Age of the internal carotid artery occlusion is indeterminate. It is possible this delayed perfusion is a chronic or possibly acute finding.   Mr Brain Wo Contrast 08/28/2018 IMPRESSION:  1. 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 Possible faint scattered associated petechial hemorrhage without frank hemorrhagic transformation or significant mass effect.  2. Abnormal flow void within the left ICA to its cavernous segment, consistent with known left ICA occlusion.  3. 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  4. Moderately advanced cerebral atrophy with chronic small vessel ischemic disease.   Ct Abdomen Pelvis W Contrast 08/29/2018 IMPRESSION:  1. Small hematoma in the right inguinal region in the panniculus, probably secondary to the recent angiography.  2. Bilateral consolidative infiltrates in the posterior aspect of both lower lobes with tiny adjacent pleural effusions.  3. Small amount of nonspecific ascites.  4. Slightly prominent gas in the bowel without evidence of bowel obstruction. This probably represents an ileus.   Ir Ct Head Ltd 08/31/2018 IMPRESSION:  Status post attempted endovascular revascularization of occluded left carotid artery at the bulb. 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 Approximately 90% stenosis of the dominant left vertebrobasilar junction distal to the origin of the left posterior-inferior cerebellar artery, and of approximately 70% of the proximal basilar artery. 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  PLAN: Follow-up in the clinic 1 month post discharge   Vas [place] Lower Extremity Arterial Duplex 09/04/2018 Summary:  Right: A right common femoral artery pseudoaneurysm was identified measuring 2.8 x 1.5 x 2.5 cm, with a  neck measuring 1.3cm long x 0.6cm wide. Pseudoaneurysm measures 2.1cm from the surface of the skin. The proximal right common femoral artery exhibits a monophasic waveform, suggestive of possible aortoiliac obstruction. Distal posterior tibial artery exhibits monophasic flow, and the right dorsalis pedis artery exhibits monophasic flow, suggestive of proximal obstruction.    DG Chest Portable 1 View 09/14/2018 IMPRESSION Cardiomegaly and mild interstitial edema.  Imaging past 24h:   Vas [place] Groin Pseudoaneurysm  Result Date: 09/16/2018  ARTERIAL PSEUDOANEURYSM  Exam: Right groin History: Follow up pseudoaneurysm. Comparison Study: 09/04/2018 Performing Technologist: Jeb Levering RDMS, RVT  Examination Guidelines: A complete evaluation includes B-mode imaging, spectral Doppler, color Doppler, and power Doppler as needed of all accessible portions of each vessel. Bilateral testing is considered an integral part of a complete examination. Limited examinations for reoccurring indications may be performed as noted. +------------+----------+--------+------+----------+ Right DuplexPSV (cm/s)WaveformPlaqueComment(s) +------------+----------+--------+------+----------+ CFA             77    biphasic                 +------------+----------+--------+------+----------+ Prox SFA        65    biphasic                 +------------+----------+--------+------+----------+ Right Vein comments:Patent common femoral vein.  Findings: An  area with well defined borders measuring 4.0 cm x 2.2 cm was visualized arising off of the Right CFA with ultrasound characteristics of a pseudoaneurysm. Mixed echos within the structure suggest that it is partially thrombosed with a residual diameter  of 2.5 cm x 1.9 cm. The neck measures approximately 0.6 cm wide and 1.8 cm long.    --------------------------------------------------------------------------------    Preliminary      PHYSICAL EXAM  General- Well nourished,  well developed elderly Caucasian male, awake alert  Ophthalmologic- fundi not visualized due to noncooperation.  Cardiovascular - irregularly irregular heart rate and rhythm.  Respiratory - decreased bases, no wheezing/rhonchi noted. No increased WOB. sats will drop to low 80s then raise to high 90s over 1-2 mins without distress or efforts to correct.  Neuro - awake alert, able to following simple commands. Able to repeat sentences and answer questions. Decreased spontaneous speech,  paucity of speech. Becomes short of breath during communication. Speaking in short sentences. Can name. PERRLA, EOMI, able to track objects on the both sides, inconsistent blinking to visual threat on the R. Facial symmetric. Tongue midline. Left upper extremity 4/5. Right lower extremity and right upper extremity flaccid. Left lower extremity proximal 2+/5 and distal 3/5. DTR 1+, Babinski negative. Sensation symmetrical, coordination slow but intact on the left finger-to-nose. Gait not tested.   ASSESSMENT/PLAN Mr.Allenmichael Elie GoodyOgden Markhamis a 78 y.o.malewith history of HTN, atrial fibrillation on Pradaxa last dose last evening, urethral stricture, cardiomyopathy, peripheral vascular disease, hypertension, hyperlipidemia, stroke with some balance issues, bilateral lymphedema vitamin D deficiencypresenting with right lower extremity numbness and weaknessand suspected new Lt ICA occlusion.Hedid not receive IV t-PA due to late presentation.  Stroke:Lt MCA and ACA territory scattered small infarcts - embolic pattern, likely due to left ICA occlusion.  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  MRI head -left MCA and ACA territory scattered small infarcts  CTA H&N- left ICA, right VA occlusion. 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  DSA -Lt ICA occlusion not able to be  revascularized, 90 % stenosis of RT ICA prox-70% stenosis of prox basilar artery- severte stenosis of dominant LT VBJ distal to Lt PICA and Occluded RT VA prox.  2D Echo - EF 60 to 65%  LDL- 81  HgbA1c- 7.1  aspirin 81 mg daily and Pradaxa (dabigatran) twice a dayprior to admission, now on Eliquis 5 mg twice daily  Therapy recommendations: SNF  Disposition: SNF  Aspiration pneumonitis  Fever Tmax 101.2->101.3-> afebrile->101.3-> afebrile  Leukocytosis 17.3  CXRslightly improved interstitial edema, suspect small effusions  Not on lasix PTA  Sputum culture normal flora  Off Zosyn   UA negative  Respiratory distress  Was intubated on ventilation  Self extubated on 09/01/18  Off sedation  So far on nasal cannula, tolerating  CXR - 09/15/2018 -slightly improvedmild interstitial edema.  Improvedshortness breathafterLasix and breathing   Will spontaneous desat low 80s without distress and return to normal high 90s without intervention   Continue to monitor  Atrial fibrillation, chronic  On Pradaxa PTA, compliant with medication  On metoprolol 50 bid, rate controlled  On Eliquis and amiodarone po  Had 2.16 sec pause. Will watch over night for recurrence   Hypotension History of hypertension  BP stable   Off neo  On metoprolol -increasedose to50mg  bid  Avoid hypotension due to 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 as well as basal artery stenosis  BP goal 120-160  CHF  CXR showed CHF  Fluid overload - off lasix   Anasarca much improved  Cardiology signed off  CXR - 09/15/2018 -slightly improvedmild interstitial edema.  Stable changes of congestive failure and interstitial edema.  Right femoral A. 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 Arbie Cookey evaluated - "At some point will need  repeat duplex to determine if he is spontaneously resolved his false aneurysm".  Repeat US showed 4.0x2.2cm R CFA pseudoaneurysm w/ mixed echos suggesting thrombosed w/ residual diameter  Cr. Brabham (for Dr. Arbie Cookey VVS to performed thrombin injection vs stitch into pseudoaneurysm for stabilization 09/16/2017  Hyperlipidemia  Lipid lowering medication BWI:OMBT  LDL81, goal < 70  Current lipid lowering medication:none (statin intolerant - abnormal labs)  Add zetia   Continue zetia on d/c  Diabetes  HgbA1c7.1, goal < 7.0  Uncontrolled  Hyperglycemia improved  CBG monitoring  On Levemir, continue  Intermittent agitation, resolved  Likely due to aphasia, right hemiparesis and discomfort position with obesity  Better with family in room  seroquel 25mg  QHs  Other Stroke Risk Factors  Advanced age  Former cigarette smoker - quit  Morbid Obesity,Body mass index is 47.79 kg/m., recommend weight loss, diet and exercise as appropriate   Hx stroke/TIA  PVD  OSA - tried to use CPAP but pt did not tolerate. Uses sleep pillow at home. Allow wife to bring to SNF for use at SNF at night  Other Active Problems  Urethral stricture - foley out  Diffuse anasarca - resolved  Hypokalemia, resolved -2.8 supplement-4.3  Low Magnesium 1.8 - supplement  Hb -11.4   UTI.WBCs - 10.3->17. Afebrile. UA w/ lg LE, > 50 WBC, many bacteria. Check culture. Treated with 1 doses Rocephin in hospital. Discussed with pharmacy. Change to Bactrim bid x 3 days.  Malnutrition d/t acute illness. Glucerna.   Hospital day # 20  Annie Main, MSN, APRN, ANVP-BC, AGPCNP-BC Advanced Practice Stroke Nurse [facility] See Amion for Schedule & Pager information 09/16/2018 3:34 PM   ATTENDING NOTE: I reviewed above note and agree with the assessment and plan. Pt was seen and examined.   Wife at bedside.  Patient lying in bed, still has OSA once fell back to sleep,  patient has been refusing facemask during this hospitalization.  Wife said he has been using at home nasal pillows and she would like to bring the nasal pillows to rehab for him to continue use at night.  Vascular surgery planned for some injection either later today or tomorrow.  Vitals stable, WBC stable from yesterday 17.0 to today at 17.3.  Patient had 2.3 seconds pause on telemetry this morning, curb sided cardiology, recommend close monitoring and continue current treatment.  Social worker is working on Garment/textile technologist.  Marvel Plan, MD PhD Stroke Neurology 09/16/2018 5:31 PM      To contact Stroke Continuity provider, please refer to [email or web address]. After hours, contact General Neurology

## 2018-09-16 NOTE — Progress Notes (Signed)
Physical Therapy Treatment Patient Details Name: Darren Frazier MRN: [id number] DOB: 02/28/1941 Today's Date: 09/16/2018    History of Present Illness 78 yo male  PMH including Cardiomyopathy (HCC), DM, Dysrhythmia, AAA, Hyperlipidemia, HTN, Hypothyroidism, Morbid obesity PVD, Stroke (2011), Tremors of nervous system. Presented to [facility] 12/25 with Rt leg numbness and weakness.  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  He was transferred to [facility].  He was intubated for neuro-IR intervention 12/25, self-extubated 12/30. MRI head left MCA/ACA territory scattered small acute infarcts; multiple scattered chronic underlying cortical infarcts involving the bilateral cerebral and right cerebellar hemispheres; moderately advanced cerebral atrophy with chronic small vessel.     PT Comments    Pt was restless and mildly combative initially.   With encouragement, pt do participate at EOB.  Emphasized transitions to  EOB, sitting EOB for activity tolerance and balance and sit to stand with control of R knee    Follow Up Recommendations  SNF     Equipment Recommendations  Wheelchair (measurements PT);Wheelchair cushion (measurements PT);3in1 (PT)    Recommendations for Other Services       Precautions / Restrictions Precautions Precautions: Fall Required Braces or Orthoses: Other Brace Other Brace: PRAFO boots bil    Mobility  Bed Mobility Overal bed mobility: Needs Assistance Bed Mobility: Rolling;Supine to Sit;Sit to Supine Rolling: Max assist   Supine to sit: Total assist;+2 for physical assistance Sit to supine: Total assist;+2 for physical assistance   General bed mobility comments: pt unable to focus on task enough to exert much effort to command.  Transfers Overall transfer level: Needs assistance   Transfers: Sit to/from Stand Sit to Stand: Max assist;Total assist;+2 physical assistance         General transfer comment: Face ti face assist of sit to stand to help with lateral  scoot toward HOB.   With use of the bed pan pt approximated a full upright stand.  Ambulation/Gait                 Stairs             Wheelchair Mobility    Modified Rankin (Stroke Patients Only) Modified Rankin (Stroke Patients Only) Modified Rankin: Severe disability     Balance Overall balance assessment: Needs assistance Sitting-balance support: Single extremity supported;Feet supported Sitting balance-Leahy Scale: Poor Sitting balance - Comments: mod to max for balance at EOB.  Tendency to list R.  Sat EOB 7-8 min while washing face/head, back  and replaced all leads     Standing balance-Leahy Scale: Zero                              Cognition Arousal/Alertness: Lethargic;Awake/alert Behavior During Therapy: Flat affect Overall Cognitive Status: Impaired/Different from baseline Area of Impairment: Orientation;Memory;Following commands;Safety/judgement;Awareness                 Orientation Level: Situation;Place;Time Current Attention Level: Sustained Memory: Decreased recall of precautions;Decreased short-term memory Following Commands: Follows one step commands with increased time;Follows one step commands inconsistently Safety/Judgement: Decreased awareness of safety;Decreased awareness of deficits Awareness: Intellectual Problem Solving: Slow processing;Decreased initiation;Difficulty sequencing;Requires verbal cues;Requires tactile cues        Exercises      General Comments General comments (skin integrity, edema, etc.): SpO2 on RA during activity maintained at 90% on RA      Pertinent Vitals/Pain Pain Assessment: Faces Faces Pain Scale: No hurt    Home Living  Prior Function            PT Goals (current goals can now be found in the care plan section) Acute Rehab PT Goals Patient Stated Goal: none stated PT Goal Formulation: With patient/family Time For Goal Achievement:  09/17/18 Potential to Achieve Goals: Fair Progress towards PT goals: Progressing toward goals    Frequency    Min 3X/week      PT Plan Current plan remains appropriate    Co-evaluation              AM-PAC PT "6 Clicks" Mobility   Outcome Measure  Help needed turning from your back to your side while in a flat bed without using bedrails?: Total Help needed moving from lying on your back to sitting on the side of a flat bed without using bedrails?: Total Help needed moving to and from a bed to a chair (including a wheelchair)?: Total Help needed standing up from a chair using your arms (e.g., wheelchair or bedside chair)?: Total Help needed to walk in hospital room?: Total Help needed climbing 3-5 steps with a railing? : Total 6 Click Score: 6    End of Session   Activity Tolerance: Patient tolerated treatment well Patient left: in bed;with call bell/phone within reach;with bed alarm set;Other (comment)(PRAFO on bil) Nurse Communication: Mobility status PT Visit Diagnosis: Other abnormalities of gait and mobility (R26.89);Muscle weakness (generalized) (M62.81);Other symptoms and signs involving the nervous system (R29.898);Hemiplegia and hemiparesis Hemiplegia - Right/Left: Right Hemiplegia - caused by: Cerebral infarction     Time: 3762-8315 PT Time Calculation (min) (ACUTE ONLY): 23 min  Charges:  $Therapeutic Activity: 8-22 mins $Neuromuscular Re-education: 8-22 mins                     09/16/2018  Badger Bing, PT Acute Rehabilitation Services [phone]  (pager) [phone]  (office)   Darren Frazier 09/16/2018, 5:35 PM

## 2018-09-16 NOTE — Progress Notes (Signed)
  Progress Note    09/16/2018 9:12 AM Hospital Day 20  Subjective:  Wants his mittens off  afebrile  Vitals:   09/16/18 0340 09/16/18 0737  BP: (!) 142/76 136/78  Pulse: 82 80  Resp: (!) 24 20  Temp: 98.1 F (36.7 C) 98.3 F (36.8 C)  SpO2: 95% 97%    Physical Exam: General:  agitated Lungs:  Non labored Abdomen:  Large pannus non tender to palpation Extremities:  Unable to palpate pulsatile mass right groin  CBC    Component Value Date/Time   WBC 17.3 (H) 09/16/2018 0520   RBC 3.81 (L) 09/16/2018 0520   HGB 12.2 (L) 09/16/2018 0520   HGB 14.2 03/07/2013 1849   HCT 39.0 09/16/2018 0520   HCT 41.5 03/07/2013 1849   PLT 429 (H) 09/16/2018 0520   PLT 212 03/07/2013 1849   MCV 102.4 (H) 09/16/2018 0520   MCV 99 03/07/2013 1849   MCH 32.0 09/16/2018 0520   MCHC 31.3 09/16/2018 0520   RDW 14.6 09/16/2018 0520   RDW 13.2 03/07/2013 1849   LYMPHSABS 2.0 08/28/2018 0600   MONOABS 1.4 (H) 08/28/2018 0600   EOSABS 0.2 08/28/2018 0600   BASOSABS 0.1 08/28/2018 0600    BMET    Component Value Date/Time   NA 138 09/16/2018 0520   NA 137 03/07/2013 1849   K 4.3 09/16/2018 0520   K 3.6 03/07/2013 1849   CL 99 09/16/2018 0520   CL 101 03/07/2013 1849   CO2 29 09/16/2018 0520   CO2 26 03/07/2013 1849   GLUCOSE 145 (H) 09/16/2018 0520   GLUCOSE 135 (H) 03/07/2013 1849   BUN 10 09/16/2018 0520   BUN 18 03/07/2013 1849   CREATININE 1.01 09/16/2018 0520   CREATININE 1.06 03/07/2013 1849   CALCIUM 9.1 09/16/2018 0520   CALCIUM 9.4 03/07/2013 1849   GFRNONAA >60 09/16/2018 0520   GFRNONAA >60 03/07/2013 1849   GFRAA >60 09/16/2018 0520   GFRAA >60 03/07/2013 1849    INR    Component Value Date/Time   INR 1.50 08/27/2018 2200   INR 1.1 03/07/2013 1849     Intake/Output Summary (Last 24 hours) at 09/16/2018 0912 Last data filed at 09/16/2018 0742 Gross per 24 hour  Intake 280 ml  Output 725 ml  Net -445 ml   Right lower extremity arterial psa duplex  09/16/2018: An area with well defined borders measuring 4.0 cm x 2.2 cm was visualized arising off of the Right CFA with ultrasound characteristics of a pseudoaneurysm. Mixed echos within the structure suggest that it is partially thrombosed with a residual diameter  of 2.5 cm x 1.9 cm. The neck measures approximately 0.6 cm wide and 1.8 cm long.   Assessment/Plan:  78 y.o. male who is s/p neuro interventional arteriogram 08/27/18 now with [facility] Day 20  -pt has persistent right femoral psa per duplex this morning   -will plan for thrombin injection tomorrow -will need consent from spouse given pt is not alert to time/place/president.  -npo after MN   Doreatha Massed, PA-C [facility] [phone] 09/16/2018 9:12 AM

## 2018-09-16 NOTE — Progress Notes (Signed)
Pt refusing CPAP for the night. RT will continue to monitor as needed.  

## 2018-09-16 NOTE — Progress Notes (Signed)
Patient had a 2.35 pause according to tele, Dr Roda Shutters paged

## 2018-09-16 NOTE — Progress Notes (Signed)
RLE limited arterial duplex       has been completed. Preliminary results can be found under CV proc through chart review. Jeb Levering, BS, RDMS, RVT

## 2018-09-17 ENCOUNTER — Inpatient Hospital Stay (HOSPITAL_COMMUNITY)
Admission: EM | Admit: 2018-09-17 | Discharge: 2018-09-30 | Disposition: A | Discharge status: Skilled Nursing Facility | Payer: Medicare HMO | Source: Home / Self Care | Attending: Internal Medicine | Admitting: Internal Medicine

## 2018-09-17 ENCOUNTER — Emergency Department (HOSPITAL_COMMUNITY): Payer: Medicare HMO

## 2018-09-17 ENCOUNTER — Other Ambulatory Visit: Payer: Self-pay

## 2018-09-17 ENCOUNTER — Inpatient Hospital Stay (HOSPITAL_COMMUNITY): Payer: Medicare HMO

## 2018-09-17 DIAGNOSIS — E1159 Type 2 diabetes mellitus with other circulatory complications: Secondary | ICD-10-CM

## 2018-09-17 DIAGNOSIS — I509 Heart failure, unspecified: Secondary | ICD-10-CM

## 2018-09-17 DIAGNOSIS — I10 Essential (primary) hypertension: Secondary | ICD-10-CM | POA: Diagnosis present

## 2018-09-17 DIAGNOSIS — R4701 Aphasia: Secondary | ICD-10-CM

## 2018-09-17 DIAGNOSIS — Z8673 Personal history of transient ischemic attack (TIA), and cerebral infarction without residual deficits: Secondary | ICD-10-CM | POA: Insufficient documentation

## 2018-09-17 DIAGNOSIS — R0902 Hypoxemia: Secondary | ICD-10-CM

## 2018-09-17 DIAGNOSIS — R4182 Altered mental status, unspecified: Secondary | ICD-10-CM

## 2018-09-17 DIAGNOSIS — G92 Toxic encephalopathy: Secondary | ICD-10-CM

## 2018-09-17 DIAGNOSIS — J9691 Respiratory failure, unspecified with hypoxia: Secondary | ICD-10-CM | POA: Diagnosis present

## 2018-09-17 DIAGNOSIS — I6340 Cerebral infarction due to embolism of unspecified cerebral artery: Secondary | ICD-10-CM

## 2018-09-17 DIAGNOSIS — I724 Aneurysm of artery of lower extremity: Secondary | ICD-10-CM

## 2018-09-17 DIAGNOSIS — J9600 Acute respiratory failure, unspecified whether with hypoxia or hypercapnia: Secondary | ICD-10-CM

## 2018-09-17 DIAGNOSIS — J811 Chronic pulmonary edema: Secondary | ICD-10-CM

## 2018-09-17 DIAGNOSIS — I729 Aneurysm of unspecified site: Secondary | ICD-10-CM | POA: Insufficient documentation

## 2018-09-17 DIAGNOSIS — I63412 Cerebral infarction due to embolism of left middle cerebral artery: Secondary | ICD-10-CM

## 2018-09-17 DIAGNOSIS — Z978 Presence of other specified devices: Secondary | ICD-10-CM

## 2018-09-17 DIAGNOSIS — J9690 Respiratory failure, unspecified, unspecified whether with hypoxia or hypercapnia: Secondary | ICD-10-CM

## 2018-09-17 DIAGNOSIS — I63232 Cerebral infarction due to unspecified occlusion or stenosis of left carotid arteries: Secondary | ICD-10-CM | POA: Diagnosis present

## 2018-09-17 DIAGNOSIS — Z95828 Presence of other vascular implants and grafts: Secondary | ICD-10-CM

## 2018-09-17 DIAGNOSIS — J9601 Acute respiratory failure with hypoxia: Secondary | ICD-10-CM

## 2018-09-17 DIAGNOSIS — G4733 Obstructive sleep apnea (adult) (pediatric): Secondary | ICD-10-CM

## 2018-09-17 LAB — COMPREHENSIVE METABOLIC PANEL
ALT: 28 U/L (ref 0–44)
AST: 36 U/L (ref 15–41)
Albumin: 2.7 g/dL — ABNORMAL LOW (ref 3.5–5.0)
Alkaline Phosphatase: 81 U/L (ref 38–126)
Anion gap: 9 (ref 5–15)
BUN: 12 mg/dL (ref 8–23)
CO2: 28 mmol/L (ref 22–32)
Calcium: 8.8 mg/dL — ABNORMAL LOW (ref 8.9–10.3)
Chloride: 104 mmol/L (ref 98–111)
Creatinine, Ser: 1.14 mg/dL (ref 0.61–1.24)
GFR calc Af Amer: >60 mL/min (ref >60)
GFR calc non Af Amer: >60 mL/min (ref >60)
Glucose, Bld: 137 mg/dL — ABNORMAL HIGH (ref 70–99)
Potassium: 4.8 mmol/L (ref 3.5–5.1)
SODIUM: 141 mmol/L (ref 135–145)
Total Bilirubin: 0.7 mg/dL (ref 0.3–1.2)
Total Protein: 7.1 g/dL (ref 6.5–8.1)

## 2018-09-17 LAB — CBC WITH DIFFERENTIAL/PLATELET
Abs Immature Granulocytes: 0.11 10*3/uL — ABNORMAL HIGH (ref 0.00–0.07)
BASOS PCT: 1 %
Basophils Absolute: 0.1 10*3/uL (ref 0.0–0.1)
Eosinophils Absolute: 0.1 10*3/uL (ref 0.0–0.5)
Eosinophils Relative: 1 %
HCT: 40.2 % (ref 39.0–52.0)
Hemoglobin: 12.1 g/dL — ABNORMAL LOW (ref 13.0–17.0)
Immature Granulocytes: 1 %
Lymphocytes Relative: 11 %
Lymphs Abs: 1.3 10*3/uL (ref 0.7–4.0)
MCH: 31.8 pg (ref 26.0–34.0)
MCHC: 30.1 g/dL (ref 30.0–36.0)
MCV: 105.8 fL — ABNORMAL HIGH (ref 80.0–100.0)
MONOS PCT: 10 %
Monocytes Absolute: 1.2 10*3/uL — ABNORMAL HIGH (ref 0.1–1.0)
Neutro Abs: 9.6 10*3/uL — ABNORMAL HIGH (ref 1.7–7.7)
Neutrophils Relative %: 76 %
Platelets: 358 10*3/uL (ref 150–400)
RBC: 3.8 MIL/uL — ABNORMAL LOW (ref 4.22–5.81)
RDW: 14.8 % (ref 11.5–15.5)
WBC: 12.3 10*3/uL — ABNORMAL HIGH (ref 4.0–10.5)
nRBC: 0 % (ref 0.0–0.2)

## 2018-09-17 LAB — URINALYSIS, ROUTINE W REFLEX MICROSCOPIC
Bacteria, UA: NONE SEEN
Bilirubin Urine: NEGATIVE
Glucose, UA: NEGATIVE mg/dL
Ketones, ur: NEGATIVE mg/dL
Nitrite: NEGATIVE
Protein, ur: 100 mg/dL — AB
RBC / HPF: >50 RBC/hpf — ABNORMAL HIGH (ref 0–5)
SPECIFIC GRAVITY, URINE: 1.025 (ref 1.005–1.030)
WBC, UA: >50 WBC/hpf — ABNORMAL HIGH (ref 0–5)
pH: 5 (ref 5.0–8.0)

## 2018-09-17 LAB — CBC
HCT: 36.9 % — ABNORMAL LOW (ref 39.0–52.0)
Hemoglobin: 11.8 g/dL — ABNORMAL LOW (ref 13.0–17.0)
MCH: 32.4 pg (ref 26.0–34.0)
MCHC: 32 g/dL (ref 30.0–36.0)
MCV: 101.4 fL — ABNORMAL HIGH (ref 80.0–100.0)
Platelets: 436 10*3/uL — ABNORMAL HIGH (ref 150–400)
RBC: 3.64 MIL/uL — ABNORMAL LOW (ref 4.22–5.81)
RDW: 14.6 % (ref 11.5–15.5)
WBC: 14.4 10*3/uL — AB (ref 4.0–10.5)
nRBC: 0 % (ref 0.0–0.2)

## 2018-09-17 LAB — I-STAT CG4 LACTIC ACID, ED: Lactic Acid, Venous: 2.18 mmol/L (ref 0.5–1.9)

## 2018-09-17 LAB — BASIC METABOLIC PANEL
Anion gap: 9 (ref 5–15)
BUN: 11 mg/dL (ref 8–23)
CO2: 29 mmol/L (ref 22–32)
CREATININE: 1.08 mg/dL (ref 0.61–1.24)
Calcium: 9.2 mg/dL (ref 8.9–10.3)
Chloride: 100 mmol/L (ref 98–111)
GFR calc non Af Amer: >60 mL/min (ref >60)
Glucose, Bld: 137 mg/dL — ABNORMAL HIGH (ref 70–99)
Potassium: 4.3 mmol/L (ref 3.5–5.1)
Sodium: 138 mmol/L (ref 135–145)

## 2018-09-17 LAB — I-STAT CHEM 8, ED
BUN: 18 mg/dL (ref 8–23)
Calcium, Ion: 1.2 mmol/L (ref 1.15–1.40)
Chloride: 100 mmol/L (ref 98–111)
Creatinine, Ser: 1.2 mg/dL (ref 0.61–1.24)
Glucose, Bld: 143 mg/dL — ABNORMAL HIGH (ref 70–99)
HCT: 40 % (ref 39.0–52.0)
Hemoglobin: 13.6 g/dL (ref 13.0–17.0)
Potassium: 5.2 mmol/L — ABNORMAL HIGH (ref 3.5–5.1)
Sodium: 139 mmol/L (ref 135–145)
TCO2: 37 mmol/L — ABNORMAL HIGH (ref 22–32)

## 2018-09-17 LAB — GLUCOSE, CAPILLARY
GLUCOSE-CAPILLARY: 129 mg/dL — AB (ref 70–99)
Glucose-Capillary: 101 mg/dL — ABNORMAL HIGH (ref 70–99)
Glucose-Capillary: 124 mg/dL — ABNORMAL HIGH (ref 70–99)
Glucose-Capillary: 126 mg/dL — ABNORMAL HIGH (ref 70–99)

## 2018-09-17 LAB — I-STAT ARTERIAL BLOOD GAS, ED
Acid-Base Excess: 9 mmol/L — ABNORMAL HIGH (ref 0.0–2.0)
Bicarbonate: 35.9 mmol/L — ABNORMAL HIGH (ref 20.0–28.0)
O2 Saturation: 100 %
TCO2: 38 mmol/L — ABNORMAL HIGH (ref 22–32)
pCO2 arterial: 60.4 mmHg — ABNORMAL HIGH (ref 32.0–48.0)
pH, Arterial: 7.382 (ref 7.350–7.450)
pO2, Arterial: 201 mmHg — ABNORMAL HIGH (ref 83.0–108.0)

## 2018-09-17 LAB — I-STAT TROPONIN, ED: Troponin i, poc: 0.06 ng/mL (ref 0.00–0.08)

## 2018-09-17 LAB — SALICYLATE LEVEL

## 2018-09-17 LAB — LIPASE, BLOOD: Lipase: 49 U/L (ref 11–51)

## 2018-09-17 LAB — CBG MONITORING, ED: Glucose-Capillary: 135 mg/dL — ABNORMAL HIGH (ref 70–99)

## 2018-09-17 LAB — ACETAMINOPHEN LEVEL: Acetaminophen (Tylenol), Serum: <10 ug/mL — ABNORMAL LOW (ref 10–30)

## 2018-09-17 MED ORDER — INSULIN DETEMIR 100 UNIT/ML ~~LOC~~ SOLN
10.0000 [IU] | Freq: Every day | SUBCUTANEOUS | Status: DC
  Administered 2018-09-18 – 2018-09-19 (×2): 10 [IU] via SUBCUTANEOUS
  Filled 2018-09-17 (×2): qty 0.1

## 2018-09-17 MED ORDER — FAMOTIDINE 40 MG/5ML PO SUSR
20.0000 mg | Freq: Two times a day (BID) | ORAL | Status: DC
  Administered 2018-09-18 – 2018-09-19 (×4): 20 mg via ORAL
  Filled 2018-09-17 (×6): qty 2.5

## 2018-09-17 MED ORDER — ROCURONIUM BROMIDE 50 MG/5ML IV SOLN
INTRAVENOUS | Status: AC | PRN
  Administered 2018-09-17: 100 mg via INTRAVENOUS

## 2018-09-17 MED ORDER — SODIUM CHLORIDE 0.9 % IV SOLN
2.0000 g | Freq: Once | INTRAVENOUS | Status: AC
  Administered 2018-09-17: 2 g via INTRAVENOUS
  Filled 2018-09-17: qty 2

## 2018-09-17 MED ORDER — FENTANYL CITRATE (PF) 100 MCG/2ML IJ SOLN
50.0000 ug | Freq: Once | INTRAMUSCULAR | Status: AC
  Administered 2018-09-17: 50 ug via INTRAVENOUS

## 2018-09-17 MED ORDER — CHLORHEXIDINE GLUCONATE 0.12% ORAL RINSE (MEDLINE KIT)
15.0000 mL | Freq: Two times a day (BID) | OROMUCOSAL | Status: DC
  Administered 2018-09-18 – 2018-09-19 (×4): 15 mL via OROMUCOSAL

## 2018-09-17 MED ORDER — ROCURONIUM BROMIDE 50 MG/5ML IV SOLN: 130.0000 mg | Freq: Once | INTRAVENOUS | Status: DC

## 2018-09-17 MED ORDER — SODIUM CHLORIDE 0.9 % IV SOLN
250.0000 mL | INTRAVENOUS | Status: DC
  Administered 2018-09-17: 250 mL via INTRAVENOUS

## 2018-09-17 MED ORDER — LEVOTHYROXINE SODIUM 88 MCG PO TABS
88.0000 ug | ORAL_TABLET | Freq: Every day | ORAL | Status: DC
  Administered 2018-09-18 – 2018-09-19 (×2): 88 ug
  Filled 2018-09-17 (×2): qty 1

## 2018-09-17 MED ORDER — ETOMIDATE 2 MG/ML IV SOLN: 38.0000 mg | Freq: Once | INTRAVENOUS | Status: DC

## 2018-09-17 MED ORDER — BISACODYL 10 MG RE SUPP
10.0000 mg | Freq: Every day | RECTAL | Status: DC | PRN
  Administered 2018-09-24 – 2018-09-29 (×2): 10 mg via RECTAL
  Filled 2018-09-17 (×2): qty 1

## 2018-09-17 MED ORDER — FENTANYL CITRATE (PF) 100 MCG/2ML IJ SOLN
100.0000 ug | Freq: Once | INTRAMUSCULAR | Status: AC
  Administered 2018-09-17: 100 ug via INTRAVENOUS
  Filled 2018-09-17: qty 2

## 2018-09-17 MED ORDER — MIDAZOLAM HCL 2 MG/2ML IJ SOLN
1.0000 mg | INTRAMUSCULAR | Status: DC | PRN
  Administered 2018-09-19: 1 mg via INTRAVENOUS
  Filled 2018-09-17: qty 2

## 2018-09-17 MED ORDER — DOCUSATE SODIUM 50 MG/5ML PO LIQD
100.0000 mg | Freq: Two times a day (BID) | ORAL | Status: DC | PRN
  Administered 2018-09-21 – 2018-09-29 (×6): 100 mg
  Filled 2018-09-17 (×7): qty 10

## 2018-09-17 MED ORDER — ETOMIDATE 2 MG/ML IV SOLN
INTRAVENOUS | Status: AC | PRN
  Administered 2018-09-17: 38 mg via INTRAVENOUS

## 2018-09-17 MED ORDER — FENTANYL 2500MCG IN NS 250ML (10MCG/ML) PREMIX INFUSION
25.0000 ug/h | INTRAVENOUS | Status: DC
  Administered 2018-09-17: 25 ug/h via INTRAVENOUS
  Administered 2018-09-19: 125 ug/h via INTRAVENOUS
  Filled 2018-09-17 (×2): qty 250

## 2018-09-17 MED ORDER — ORAL CARE MOUTH RINSE
15.0000 mL | OROMUCOSAL | Status: DC
  Administered 2018-09-18 – 2018-09-19 (×14): 15 mL via OROMUCOSAL

## 2018-09-17 MED ORDER — MIDAZOLAM HCL 2 MG/2ML IJ SOLN
2.0000 mg | Freq: Once | INTRAMUSCULAR | Status: AC
  Administered 2018-09-17: 2 mg via INTRAVENOUS
  Filled 2018-09-17: qty 2

## 2018-09-17 MED ORDER — PHENYLEPHRINE HCL-NACL 10-0.9 MG/250ML-% IV SOLN
25.0000 ug/min | INTRAVENOUS | Status: DC
  Filled 2018-09-17: qty 250

## 2018-09-17 MED ORDER — FUROSEMIDE 10 MG/ML IJ SOLN
40.0000 mg | Freq: Once | INTRAMUSCULAR | Status: AC
  Administered 2018-09-18: 40 mg via INTRAVENOUS
  Filled 2018-09-17: qty 4

## 2018-09-17 MED ORDER — HEPARIN SODIUM (PORCINE) 5000 UNIT/ML IJ SOLN: 5000.0000 [IU] | Freq: Three times a day (TID) | INTRAMUSCULAR | Status: DC

## 2018-09-17 MED ORDER — APIXABAN 5 MG PO TABS
5.0000 mg | ORAL_TABLET | Freq: Two times a day (BID) | ORAL | Status: DC
  Administered 2018-09-18 – 2018-09-30 (×24): 5 mg via ORAL
  Filled 2018-09-17 (×26): qty 1

## 2018-09-17 MED ORDER — METOPROLOL TARTRATE 25 MG PO TABS: 25.0000 mg | ORAL_TABLET | Freq: Two times a day (BID) | ORAL | Status: DC

## 2018-09-17 MED ORDER — VANCOMYCIN HCL 10 G IV SOLR
2500.0000 mg | Freq: Once | INTRAVENOUS | Status: AC
  Administered 2018-09-17: 2500 mg via INTRAVENOUS
  Filled 2018-09-17: qty 2500

## 2018-09-17 MED ORDER — INSULIN ASPART 100 UNIT/ML ~~LOC~~ SOLN: 0.0000 [IU] | Freq: Three times a day (TID) | SUBCUTANEOUS | Status: DC

## 2018-09-17 MED ORDER — SODIUM CHLORIDE 0.9 % IV BOLUS: 500.0000 mL | Freq: Once | INTRAVENOUS | Status: DC

## 2018-09-17 MED ORDER — LEVALBUTEROL HCL 0.63 MG/3ML IN NEBU: 0.6300 mg | INHALATION_SOLUTION | Freq: Four times a day (QID) | RESPIRATORY_TRACT | Status: DC | PRN

## 2018-09-17 MED ORDER — FENTANYL BOLUS VIA INFUSION
25.0000 ug | INTRAVENOUS | Status: DC | PRN
  Administered 2018-09-18 (×2): 25 ug via INTRAVENOUS
  Filled 2018-09-17: qty 25

## 2018-09-17 MED ORDER — VANCOMYCIN HCL 10 G IV SOLR: 1750.0000 mg | INTRAVENOUS | Status: DC

## 2018-09-17 NOTE — ED Notes (Signed)
Notified Dr. Little of elevated lactic acid 

## 2018-09-17 NOTE — ED Triage Notes (Signed)
Pt brought in by PTAR from [facility] in [place]. Per EMS pt was discharged today from 3W at [facility], facility refused pt stating his O2 sats were "too low". Pt agonal and nonresponsive on arrival. EDP made aware and is at bedside preparing to intubate. Per EMS pt was admitted for a stroke with right sided deficits.

## 2018-09-17 NOTE — Op Note (Signed)
Date: September 17, 2018  Preoperative diagnosis: Right femoral pseudoaneurysm  Postoperative diagnosis: Same  Procedure: Thrombin injection of right femoral artery pseudoaneurysm  Surgeon: Dr. Cephus Shelling, MD  Assistant: Dr. Gretta Began, MD and Doreatha Massed, PA  Indications: Patient is a 78 year old male who was previously seen in consultation for right femoral pseudoaneurysm after IR intervention by Dr. Arbie Cookey.  Ultimately this has been monitored with conservative measures given significant comorbidities of the patient.  Ultimately repeat duplex yesterday showed that it was partially thrombosed with a greater than 2.5 centimeter remnant pseudoaneurysm.  We subsequent recommended thrombin injection today after risks benefits were discussed.  Findings: After thrombin injection into the right femoral pseudoaneurysm it appears to be thrombosed.  Flow down the anterior tibial and peroneal at the ankle and SFA was open after injection.  Details: The procedure was performed at the patient's bedside.  Ultimately after informed consent was obtained the right groin was prepped and draped in usual sterile fashion with chlorhexidine prep.  The groin was then toweled out with 4 sterile towels.  An assistant with sterile gloves held back his pannus and then we used the vascular lab tech who was sterilely prepped with ultrasound guidance to identify the right femoral pseudoaneurysm.  Ultimately a long 22-gauge spinal needle was used and this was placed into the pseudoaneurysm under ultrasound guidance after the neck was identified.  We then injected approximately 2 mL's of thrombin (5000 units per 5 mL's).  Under color-flow the pseudoaneurysm appeared thrombosed with no more flow pattern.  There was still flow down the SFA and there was ultrasound flow down the anterior tibial and peroneal arteries at the ankle.  At that point in time all of our sterile instruments were then disposed of and all sharps were  disposed of.  Patient tolerated the procedure without any apparent complications.  Cephus Shelling, MD [facility] [place] Office: [phone] Pager: [phone]  Cephus Shelling

## 2018-09-17 NOTE — Clinical Social Work Placement (Signed)
Nurse to call report to [phone], Room 325 in C Wing (Ask for C Wing for report)     CLINICAL SOCIAL WORK PLACEMENT  NOTE  Date:  09/17/2018  Patient Details  Name: [name] MRN: [id number] Date of Birth: 1940-10-01  Clinical Social Work is seeking post-discharge placement for this patient at the Skilled  Nursing Facility level of care (*CSW will initial, date and re-position this form in  chart as items are completed):  Yes   Patient/family provided with [facility] Clinical Social Work Department's list of facilities offering this level of care within the geographic area requested by the patient (or if unable, by the patient's family).  Yes   Patient/family informed of their freedom to choose among providers that offer the needed level of care, that participate in Medicare, Medicaid or managed care program needed by the patient, have an available bed and are willing to accept the patient.  Yes   Patient/family informed of [facility]'s ownership interest in [facility] and [facility], as well as of the fact that they are under no obligation to receive care at these facilities.  PASRR submitted to EDS on 09/08/18     PASRR number received on 09/08/18     Existing PASRR number confirmed on       FL2 transmitted to all facilities in geographic area requested by pt/family on 09/08/18     FL2 transmitted to all facilities within larger geographic area on       Patient informed that his/her managed care company has contracts with or will negotiate with certain facilities, including the following:        Yes   Patient/family informed of bed offers received.  Patient chooses bed at [facility]     Physician recommends and patient chooses bed at      Patient to be transferred to [facility] on 09/17/18.  Patient to be transferred to facility by PTAR     Patient family notified on 09/17/18 of transfer.  Name of family member  notified:  Lupita Leash     PHYSICIAN       Additional Comment:    _______________________________________________ Baldemar Lenis, LCSW 09/17/2018, 2:24 PM

## 2018-09-17 NOTE — Progress Notes (Addendum)
STROKE TEAM PROGRESS NOTE   SUBJECTIVE (INTERVAL HISTORY)  Patient in bed sleeping, NAD. No family at bedside. 2 L . Plan to discharge this afternoon. After cardiology consult. Patient had 2.3-2.6 ( x2) asystolic events O/N. Metoprolol was lowered. No further pauses per nursing. Would like cardiology recommendations prior to discharge. Thrombin injection scheduled for today has been completed.   OBJECTIVE Temp:  [97.4 F (36.3 C)-98.4 F (36.9 C)] 97.8 F (36.6 C) (01/15 0724) Pulse Rate:  [80-108] 95 (01/15 0724) Cardiac Rhythm: Atrial fibrillation;Bundle branch block;Other (Comment) (01/15 0700) Resp:  [17-22] 20 (01/15 0724) BP: (101-150)/(59-91) 150/91 (01/15 0724) SpO2:  [94 %-98 %] 96 % (01/15 0724) Weight:  [130.5 kg] 130.5 kg (01/15 0300)  Recent Labs  Lab 09/15/18 2250 09/16/18 1333 09/16/18 1722 09/16/18 2139 09/17/18 0604  GLUCAP 137* 146* 117* 126* 126*   Recent Labs  Lab 09/13/18 0500 09/14/18 0629 09/15/18 0627 09/16/18 0520 09/17/18 0430  NA 140 137 139 138 138  K 4.2 4.5 4.3 4.3 4.3  CL 102 102 99 99 100  CO2 29 26 30 29 29   GLUCOSE 126* 137* 134* 145* 137*  BUN 13 10 10 10 11   CREATININE 1.20 1.05 1.13 1.01 1.08  CALCIUM 9.1 8.9 9.3 9.1 9.2   No results for input(s): AST, ALT, ALKPHOS, BILITOT, PROT, ALBUMIN in the last 168 hours. Recent Labs  Lab 09/13/18 0500 09/14/18 0629 09/15/18 0627 09/16/18 0520 09/17/18 0430  WBC 12.9* 10.3 17.0* 17.3* 14.4*  HGB 9.9* 10.7* 11.4* 12.2* 11.8*  HCT 31.9* 35.1* 36.1* 39.0 36.9*  MCV 104.9* 102.9* 102.8* 102.4* 101.4*  PLT 451* 425* 512* 429* 436*   No results for input(s): CKTOTAL, CKMB, CKMBINDEX, TROPONINI in the last 168 hours. No results for input(s): LABPROT, INR in the last 72 hours. Recent Labs    09/15/18 0847  COLORURINE YELLOW  LABSPEC 1.015  PHURINE 5.0  GLUCOSEU NEGATIVE  HGBUR MODERATE*  BILIRUBINUR NEGATIVE  KETONESUR 5*  PROTEINUR 30*  NITRITE NEGATIVE  LEUKOCYTESUR LARGE*       Component Value Date/Time   CHOL 138 08/28/2018 0600   TRIG 171 (H) 08/28/2018 0921   HDL 24 (L) 08/28/2018 0600   CHOLHDL 5.8 08/28/2018 0600   VLDL 33 08/28/2018 0600   LDLCALC 81 08/28/2018 0600   Lab Results  Component Value Date   HGBA1C 7.1 (H) 08/28/2018    IMAGING Ct Head Code Stroke Wo Contrast` 08/27/2018 IMPRESSION:  1. No acute finding by CT. 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  2. 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 No acute finding is suspected by CT.   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  Severe irregular atherosclerotic disease of the right carotid bifurcation with stenosis of 80% in the bulb region and 30% in the distal ICA.  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 50% stenosis of the left vertebral artery origin.  Severe atherosclerotic disease in both carotid siphon regions. Right siphon stenosis estimated at 50-70%. Reconstituted flow in the distal left siphon and supraclinoid region. 80% supraclinoid stenosis. Flow in both anterior and middle cerebral artery territories. Stenosis of both M1 segments estimated at 50-70%.  I do not identify any large or medium vessel occlusion acutely within the MCA branch vessels.  Severely disease left V4 segment with serial  stenoses proximal to the basilar estimated at 70%. Basilar atherosclerotic irregularity without flow limiting stenosis.  Posterior circulation branch vessels do show flow, including right PICA which is supplied by retrograde flow in the distal right vertebral artery.   Ct Cerebral Perfusion W Contrast 08/27/2018 IMPRESSION:  155 mL of delayed perfusion in the left MCA territory. No core infarction. Delayed perfusion left MCA territory may be due to  severe intracranial atherosclerotic disease as well as occlusion of the left internal carotid artery. Age of the internal carotid artery occlusion is indeterminate. It is possible this delayed perfusion is a chronic or possibly acute finding.   Mr Brain Wo Contrast 08/28/2018 IMPRESSION:  1. 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 Possible faint scattered associated petechial hemorrhage without frank hemorrhagic transformation or significant mass effect.  2. Abnormal flow void within the left ICA to its cavernous segment, consistent with known left ICA occlusion.  3. 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  4. Moderately advanced cerebral atrophy with chronic small vessel ischemic disease.   Ct Abdomen Pelvis W Contrast 08/29/2018 IMPRESSION:  1. Small hematoma in the right inguinal region in the panniculus, probably secondary to the recent angiography.  2. Bilateral consolidative infiltrates in the posterior aspect of both lower lobes with tiny adjacent pleural effusions.  3. Small amount of nonspecific ascites.  4. Slightly prominent gas in the bowel without evidence of bowel obstruction. This probably represents an ileus.   Ir Ct Head Ltd 08/31/2018 IMPRESSION:  Status post attempted endovascular revascularization of occluded left carotid artery at the bulb. 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 Approximately 90% stenosis of the dominant left vertebrobasilar junction distal to the origin of the left posterior-inferior cerebellar artery, and of approximately 70% of the proximal basilar artery. 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  PLAN: Follow-up in the clinic 1 month post discharge   Vas [place] Lower Extremity Arterial Duplex 09/04/2018 Summary:  Right: A right common  femoral artery pseudoaneurysm was identified measuring 2.8 x 1.5 x 2.5 cm, with a neck measuring 1.3cm long x 0.6cm wide. Pseudoaneurysm measures 2.1cm from the surface of the skin. The proximal right common femoral artery exhibits a monophasic waveform, suggestive of possible aortoiliac obstruction. Distal posterior tibial artery exhibits monophasic flow, and the right dorsalis pedis artery exhibits monophasic flow, suggestive of proximal obstruction.    DG Chest Portable 1 View 09/14/2018 IMPRESSION Cardiomegaly and mild interstitial edema.  Imaging past 24h:   [place]as [place] Lower Ext Arterial Pseudo Injection  Result Date: 09/17/2018  ARTERIAL PSEUDOANEURYSM  Exam: Right groin Performing Technologist: Blanch MediaMegan Riddle RVS Supporting Technologist: Jeb LeveringJill Parker RDMS, RVT  Examination Guidelines: A complete evaluation includes B-mode imaging, spectral Doppler, color Doppler, and power Doppler as needed of all accessible portions of each vessel. Bilateral testing is considered an integral part of a complete examination. Limited examinations for reoccurring indications may be performed as noted.  Findings: An area with well defined borders measuring 2.1 cm x 2.3 cm was visualized with ultrasound characteristics of a pseudoaneurysm.  Summary: Successful thrombin injection into right groin pseudoaneurysm. Completed by Dr. Chestine Sporelark.  Post injection the right SFA is patent.    --------------------------------------------------------------------------------    Preliminary      PHYSICAL EXAM  General- Well nourished, well developed elderly Caucasian male, awake alert  Ophthalmologic-PERRL  Cardiovascular - irregularly irregular heart rate and rhythm.  Respiratory - decreased bases, no wheezing/rhonchi noted. No increased WOB. sats will drop to low 80s then raise to high 90s over 1-2 mins without distress or efforts to correct. On 2 LNC  Neuro - awake alert, able to following simple commands. Able to repeat  sentences and answer questions. Decreased spontaneous speech,  paucity of speech. Becomes short of breath during communication. Speaking in short sentences. Can name. PERRLA, EOMI, able to track objects on the both sides, inconsistent blinking to visual threat on the R. Facial symmetric. Tongue midline. Left upper extremity 4/5. Right lower extremity and right upper extremity flaccid. Left lower extremity proximal 2+/5 and distal 3/5. DTR 1+, Babinski negative. Sensation symmetrical, coordination slow but intact on the left finger-to-nose. Gait not tested.   ASSESSMENT/PLAN Darren Frazier a 78 y.o.malewith history of HTN, atrial fibrillation on Pradaxa last dose last evening, urethral stricture, cardiomyopathy, peripheral vascular disease, hypertension, hyperlipidemia, stroke with some balance issues, bilateral lymphedema vitamin D deficiencypresenting with right lower extremity numbness and weaknessand suspected new Lt ICA occlusion.Hedid not receive IV t-PA due to late presentation.  Stroke:Lt MCA and ACA territory scattered small infarcts - embolic pattern, likely due to left ICA occlusion.  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  MRI head -left MCA and ACA territory scattered small infarcts  CTA H&N- left ICA, right VA occlusion. 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  2D Echo - EF 60 to 65%  LDL- 81  HgbA1c- 7.1  Pseudoaneurysm injection: After thrombin injection into the right femoral pseudoaneurysm it appears to be thrombosed.  Flow down the anterior tibial and peroneal at the ankle and SFA was open after injection.  aspirin 81 mg daily  and Pradaxa (dabigatran) twice a dayprior to admission, now on Eliquis 5 mg twice daily  Therapy recommendations: SNF  Disposition: SNF  Aspiration pneumonitis  Fever Tmax 101.2->101.3-> afebrile->101.3-> afebrile  Leukocytosis 17.3  CXRslightly improved interstitial edema, suspect small effusions  Not on lasix PTA  Sputum culture normal flora  Off Zosyn   UA negative  Respiratory distress  Was intubated on ventilation  Self extubated on 09/01/18  Off sedation  So far on nasal cannula, tolerating  CXR - 09/15/2018 -slightly improvedmild interstitial edema.  Improvedshortness breathafterLasix and breathing   Will spontaneous desat low 80s without distress and return to normal high 90s without intervention   Continue to monitor  Atrial fibrillation, chronic  On Pradaxa PTA, compliant with medication  On metoprolol 50 bid, rate controlled  On Eliquis and amiodarone po  Had 2.16 sec pause. Will watch over night for recurrence   Hypotension History of hypertension  BP stable   Off neo  On metoprolol -increasedose to50mg  bid  Avoid hypotension due to 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 as well as basal artery stenosis  BP goal 120-160  CHF  CXR showed CHF  Fluid overload - off lasix   Anasarca much improved  Cardiology signed off  CXR - 09/15/2018 -slightly improvedmild interstitial edema.  Stable changes of congestive failure and interstitial edema.  Right femoral A. Pseudoaneurysm  Left lower abdominal wound still oozing with abdomen wall bruising  Doppler confirmed right femoral artery pseudoaneurysm 2  cm  Vascular surgery Dr. Arbie Cookey evaluated - "At some point will need repeat duplex to determine if he is spontaneously resolved his false aneurysm".  Repeat US showed 4.0x2.2cm R CFA pseudoaneurysm w/ mixed echos suggesting thrombosed w/ residual diameter  Cr. Brabham (for Dr. Arbie Cookey [facility] to performed thrombin  injection vs stitch into pseudoaneurysm for stabilization 09/16/2017  Hyperlipidemia  Lipid lowering medication VFI:EPPI  LDL81, goal < 70  Current lipid lowering medication:none (statin intolerant - abnormal labs)  Add zetia   Continue zetia on d/c  Diabetes  HgbA1c7.1, goal < 7.0  Uncontrolled  Hyperglycemia improved  CBG monitoring  On Levemir, continue  Intermittent agitation, resolved  Likely due to aphasia, right hemiparesis and discomfort position with obesity  Better with family in room  seroquel 25mg  QHs  Other Stroke Risk Factors  Advanced age  Former cigarette smoker - quit  Morbid Obesity,Body mass index is 47.79 kg/m., recommend weight loss, diet and exercise as appropriate   Hx stroke/TIA  PVD  OSA - tried to use CPAP but pt did not tolerate. Uses sleep pillow at home. Allow wife to bring to SNF for use at SNF at night  Other Active Problems  Urethral stricture - foley out  Diffuse anasarca - resolved  Hypokalemia, resolved -2.8 supplement-4.3  Low Magnesium 1.8 - supplement  Hb -11.4   UTI.WBCs - 10.3->17. Afebrile. UA w/ lg LE, > 50 WBC, many bacteria. Check culture. Treated with 1 doses Rocephin in hospital. Discussed with pharmacy. Change to Bactrim bid x 3 days.  Malnutrition d/t acute illness. Glucerna.   Hospital day # 43  Arline Asp, MSN, APRN, FNP-C Advanced Practice Stroke Nurse [facility] See Amion for Schedule & Pager information 09/17/2018 10:47 AM   ATTENDING NOTE: I reviewed above note and agree with the assessment and plan. Pt was seen and examined.   Patient lying in bed, sleeping, but arousable, wife not at bedside.  Patient had procedure with vascular surgery this morning for thrombin injection for pseudoaneurysm, tolerating well.  Patient had 2 episodes of prolonged pulseless lasting 2.6 and 2.3 seconds each.  Decrease metoprolol from 50 twice daily to 25 twice daily.  BP  stable.  Leukocytosis improving, WBC from 13.3-14.4.  Creatinine 1.08.  Cardiology was reconsulted for final recommendation before discharge.  Marvel Plan, MD PhD Stroke Neurology 09/17/2018 12:38 PM   To contact Stroke Continuity provider, please refer to [email or web address]. After hours, contact General Neurology

## 2018-09-17 NOTE — Progress Notes (Signed)
Patient transported to and from CT without any apparent complications.

## 2018-09-17 NOTE — Progress Notes (Signed)
Progress Note  Patient Name: Darren Frazier Date of Encounter: 09/17/2018  Primary Cardiologist: No primary care provider on file.   Subjective   Ready to be discharged.  Atrial fibrillation, occasional pauses 2.5 seconds.  On amiodarone as well as decreased dose of metoprolol.  Sleeping comfortably in bed, long morning.  Inpatient Medications    Scheduled Meds: . amiodarone  200 mg Oral Daily  . apixaban  5 mg Oral BID  . cholecalciferol  1,000 Units Oral Daily  . ezetimibe  10 mg Oral Daily  . feeding supplement (GLUCERNA SHAKE)  237 mL Oral BID BM  . insulin aspart  0-20 Units Subcutaneous TID WC  . insulin detemir  10 Units Subcutaneous Daily  . levothyroxine  88 mcg Oral Q0600  . mouth rinse  15 mL Mouth Rinse BID  . metoprolol tartrate  25 mg Oral BID  . QUEtiapine  50 mg Oral QHS  . sulfamethoxazole-trimethoprim  1 tablet Oral Q12H   Continuous Infusions:  PRN Meds: acetaminophen **OR** acetaminophen (TYLENOL) oral liquid 160 mg/5 mL **OR** acetaminophen, albuterol, haloperidol lactate, ondansetron (ZOFRAN) IV, polyvinyl alcohol, senna-docusate, sodium chloride flush   Vital Signs    Vitals:   09/17/18 0300 09/17/18 0444 09/17/18 0724 09/17/18 1108  BP:  119/66 (!) 150/91 129/69  Pulse:  81 95 100  Resp:  (!) 22 20   Temp:  98.1 F (36.7 C) 97.8 F (36.6 C) 98.4 F (36.9 C)  TempSrc:  Axillary Axillary Axillary  SpO2:  94% 96% 90%  Weight: 130.5 kg     Height:        Intake/Output Summary (Last 24 hours) at 09/17/2018 1255 Last data filed at 09/17/2018 1100 Gross per 24 hour  Intake -  Output 1275 ml  Net -1275 ml   Last 3 Weights 09/17/2018 09/03/2018 09/02/2018  Weight (lbs) 287 lb 11.2 oz 301 lb 13 oz 286 lb 2.5 oz  Weight (kg) 130.5 kg 136.9 kg 129.8 kg      Telemetry    atrial fibrillation heart rates currently approximately 100, as high as 130 with activity.  Interventricular conduction line noted.  On 09/12/2018- 7-second pause noted-  Personally Reviewed  ECG    No new- Personally Reviewed  Physical Exam   GEN: No acute distress.   Neck: No JVD Cardiac:  Irregularly irregular normal rate, no murmurs, rubs, or gallops.  Respiratory: Clear to auscultation bilaterally. GI: Soft, nontender, non-distended  MS: No edema; No deformity. Neuro:  sedate Psych: sedate   Labs    Chemistry Recent Labs  Lab 09/15/18 0627 09/16/18 0520 09/17/18 0430  NA 139 138 138  K 4.3 4.3 4.3  CL 99 99 100  CO2 30 29 29   GLUCOSE 134* 145* 137*  BUN 10 10 11   CREATININE 1.13 1.01 1.08  CALCIUM 9.3 9.1 9.2  GFRNONAA >60 >60 >60  GFRAA >60 >60 >60  ANIONGAP 10 10 9      Hematology Recent Labs  Lab 09/15/18 0627 09/16/18 0520 09/17/18 0430  WBC 17.0* 17.3* 14.4*  RBC 3.51* 3.81* 3.64*  HGB 11.4* 12.2* 11.8*  HCT 36.1* 39.0 36.9*  MCV 102.8* 102.4* 101.4*  MCH 32.5 32.0 32.4  MCHC 31.6 31.3 32.0  RDW 14.6 14.6 14.6  PLT 512* 429* 436*    Cardiac EnzymesNo results for input(s): TROPONINI in the last 168 hours. No results for input(s): TROPIPOC in the last 168 hours.   BNPNo results for input(s): BNP, PROBNP in the last 168 hours.  DDimer No results for input(s): DDIMER in the last 168 hours.   Radiology    Vas [place] Groin Pseudoaneurysm  Result Date: 09/16/2018  ARTERIAL PSEUDOANEURYSM  Exam: Right groin History: Follow up pseudoaneurysm. Comparison Study: 09/04/2018 Performing Technologist: Jeb Levering RDMS, RVT  Examination Guidelines: A complete evaluation includes B-mode imaging, spectral Doppler, color Doppler, and power Doppler as needed of all accessible portions of each vessel. Bilateral testing is considered an integral part of a complete examination. Limited examinations for reoccurring indications may be performed as noted. +------------+----------+--------+------+----------+ Right DuplexPSV (cm/s)WaveformPlaqueComment(s) +------------+----------+--------+------+----------+ CFA             77     biphasic                 +------------+----------+--------+------+----------+ Prox SFA        65    biphasic                 +------------+----------+--------+------+----------+ Right Vein comments:Patent common femoral vein.  Findings: An area with well defined borders measuring 4.0 cm x 2.2 cm was visualized arising off of the Right CFA with ultrasound characteristics of a pseudoaneurysm. Mixed echos within the structure suggest that it is partially thrombosed with a residual diameter  of 2.5 cm x 1.9 cm. The neck measures approximately 0.6 cm wide and 1.8 cm long.  Diagnosing physician: Coral Else MD Electronically signed by Coral Else MD on 09/16/2018 at 6:30:39 PM.   --------------------------------------------------------------------------------    Final    Vas [place] Lower Ext Arterial Pseudo Injection  Result Date: 09/17/2018  ARTERIAL PSEUDOANEURYSM  Exam: Right groin Performing Technologist: Blanch Media RVS Supporting Technologist: Jeb Levering RDMS, RVT  Examination Guidelines: A complete evaluation includes B-mode imaging, spectral Doppler, color Doppler, and power Doppler as needed of all accessible portions of each vessel. Bilateral testing is considered an integral part of a complete examination. Limited examinations for reoccurring indications may be performed as noted.  Findings: An area with well defined borders measuring 2.1 cm x 2.3 cm was visualized with ultrasound characteristics of a pseudoaneurysm.  Summary: Successful thrombin injection into right groin pseudoaneurysm. Completed by Dr. Chestine Spore.  Post injection the right SFA is patent.    --------------------------------------------------------------------------------    Preliminary     Cardiac Studies   EF 65%  Patient Profile     78 y.o. 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 We are involved for management of his atrial fib and for medication reconciliation prior to discharge.  Assessment & Plan    Persistent atrial fibrillation - Overall well controlled on p.o. amiodarone as well as metoprolol which had to be decreased because of recent pauses of 7 seconds on 1/10, metop decreased.  No pacemaker warranted since med change.  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 25 mg twice a day -Amiodarone 200 mg once a day has been present since admission.  Chronic anticoagulation -Eliquis.  Excellent.  No longer on Pradaxa.  Stroke left MCA infarct - Per neurology.  Essential hypertension - Mildly labile post stroke.  Continue with current regimen. -Let stay off of amlodipine 10. - No longer on bisoprolol 10 mg, currently  on metoprolol tartrate 25 twice daily. - Lets avoid Cardura 2 mg. - Stay off HCTZ 25 mg a day. - Stay off losartan 100 mg a day.  Please let us know if we can be of further assistance.  He will have follow-up as previously scheduled.     For questions or updates, please contact [facility] Please consult [email or web address] for contact info under        Signed, Donato SchultzMark Marquinn Meschke, MD  09/17/2018, 12:55 PM

## 2018-09-17 NOTE — ED Notes (Signed)
CCM at bedside 

## 2018-09-17 NOTE — Procedures (Signed)
Intubation Procedure Note Darren Frazier [id number] [birth year]  Procedure: Intubation Indications: Airway protection and maintenance  Procedure Details Consent: Unable to obtain consent because of altered level of consciousness. Time Out: Verified patient identification, verified procedure, site/side was marked, verified correct patient position, special equipment/implants available, medications/allergies/relevent history reviewed, required imaging and test results available.  Performed 1934  Maximum sterile technique was used including antiseptics, gloves, hand hygiene and mask.  MAC and 4    Evaluation Hemodynamic Status: BP stable throughout; O2 sats: stable throughout Patient's Current Condition: unstable Complications: No apparent complications Patient did tolerate procedure well. Chest X-ray ordered to verify placement.  CXR: tube position acceptable.   Milinda Cave 09/17/2018

## 2018-09-17 NOTE — H&P (Addendum)
NAME:  Darren Frazier, MRN:  [id number], DOB:  09/21/1940, LOS: 0 ADMISSION DATE:  09/17/2018, CONSULTATION DATE:  09/17/2017 REFERRING MD:  Dr. Clarene Duke, CHIEF COMPLAINT:  Respiratory failure  Brief History   27 yoM with was discharged today after hospitalization 12/25 -1/15 for acute stroke. Was refused at rehab facility due to hypoxia.  Required intubation for airway protection on arrival to ER.  Head CT neg.  CXR c/w pulmonary edema.   History of present illness   HPI obtained from medical chart review and per wife and son at bedside as patient is intubated and sedated on mechanical ventilation.   78 year old male with history of hypertension, A. fib on Eliquis, cardiomyopathy, hyperlipidemia, PVD, and recent stoke.  He presented from rehab facility after they refused to accept him after discharge due to hypoxia.   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  He underwent mechanical thrombectomy on 12/25, where the left ICA was not able to be revascularized. He has had residual right hemiplegia and able to say some words with paucity of speech.  Additionally, he self extubated himself on 12/30, treated for aspiration pneumonitis, treated with zosyn 12/27 - 12/29 with normal sputum culture since.  He was noted to have residual hypoxia requiring O2 and shortness of breath, with CXR 1/13 showing improvement with interstitial edema, with improvement in symptoms after lasix.  However, he was still noted to have periods of desaturation on oxygen as of today.  His TTE was noted for LVEF 60-65% but was a suboptimal study. Cardiology was following for persistent atrial fibrillation with RVR on amiodarone and metoprolol.  Doses were decreased due to recent pauses (occasional 2.5 seconds per cardiology notes today) and noted that his blood pressure had been labile.  Additionally, he was seen by vascular for a right femoral  pseudoaneurysm after IR procedure who underwent thrombin injection 1/15.  His family report that patient has recently tolerated liquids better than solid food with no overt signs of aspiration.    He was discharged to rehab this afternoon, but facility refused to accept him due to hypoxia.  He was therefore sent to ER.  On arrival, he was unresponsive with shallow breathing and hypoxic therefore intubated for airway protection.  He was taken for head CT which did not show any acute abnormality.  Neurology consulted given concern for seizure and recent stroke.  Otherwise, he has been afebrile, slightly hypotensive after sedation, and afib on monitor, mostly controlled. Labs noted for neg trop, Lactic acid 2.7, UA neg, WBC 12.3 (down from am labs of 14.4), and no significant electrolyte abnormality. EKG non acute, afib, minimal diffuse ST depression. CXR showing c/w pulmonary edema.  PCCM called for admit.   Past Medical History  NICM (EF 45% in 2015, since improved to 60-65%), DMT2, afib on Eliquis, GERD, AAA, HLD, HTN, morbid obesity, PVD, stroke, urethral stricture  Significant Hospital Events   12/25- 1/15 hospitalized  1/15- readmit   Consults:  Neurology  Procedures:  1/15 ETT >> 1/15 Foley in ER >> 1/15 OGT >>  Significant Diagnostic Tests:  1/15 CT head >> 1. No new acute intracranial abnormality. 2. Expected continued evolution of a multifocal bilateral cortical infarcts. 3. Chronic microvascular ischemic white matter disease.  1/15 CXR >>  1. Satisfactory position of the endotracheal and enteric tubes. 2. Persistent edema suggesting congestive heart failure. Increasing retrocardiac pulmonary opacity.  Micro Data:  1/15 BCx2 >>  Antimicrobials:  vanc and cefepime x 1 in ER 1/15  Interim history/subjective:    Objective   Blood pressure (!) 110/56, pulse (!) 104, temperature 99.3 F (37.4 C), temperature source Bladder, resp. rate 17, height 5\' 11"  (1.803 m), weight  130.5 kg, SpO2 94 %.    Vent Mode: PRVC FiO2 (%):  [60 %-80 %] 60 % Set Rate:  [15 bmp] 15 bmp Vt Set:  [600 mL] 600 mL PEEP:  [5 cmH20] 5 cmH20 Plateau Pressure:  [17 cmH20] 17 cmH20   Intake/Output Summary (Last 24 hours) at 09/17/2018 2224 Last data filed at 09/17/2018 2124 Gross per 24 hour  Intake 100 ml  Output -  Net 100 ml   Filed Weights   09/17/18 2122  Weight: 130.5 kg    Examination: General:  Critically ill older male sedated on MV in NAD HEENT: MM pink/dry, ETT/ OGT, pupils 3/sluggish Neuro: sedated, no response to verbal or noxious stimuli CV: IRIR, distant HS, weak peripheral pulses, right groin site wnl PULM: even/non-labored on full MV support, not breathing over set rate of 15, occasional cough, lungs bilaterally rales/ rhonchi LK:GMWNU, soft, hypoactive Extremities: warm/dry, RUE with dependent edema otherwise no edema, bilateral foot boots in place  Skin: no rashes, some moisture and redness in pannicular folds, skin breakdown to right forearm    Resolved Hospital Problem list    Assessment & Plan:  Acute hypoxic respiratory failure likely related to acute pulmonary edema - no obvious infectious etiology- no obvious infiltrate on CXR, afebrile, and WBC stable - ddx additionally included PE, however patient anticoagulated on Eliquis and Wells Score (2level) of 3, unlikely; defer CTA chest for now P:  Full MV support, PRVC 8 cc/kg, rate 15 - ABG noted Wean FiO2/ peep for sat goal > 92% Daily SBT VAP measures xopenex prn  diuresis as BP / renal tolerated  PAD protocol with continuous fentanyl, prn versed, RASS goal 0/-1  Acute encephalopathy- likely toxic metabolic Recent hx of 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 - CTH neg for acute P:  Appreciate Neurology seeing and recommendation EEG in am - low suspicion for seizure; defer AED Frequent neuro exams  Hypotension- transient with sedation - low suspicion of sepsis  given afebrile, no obvious infiltrate on CXR, UA unchanged and WBC improved  P:  Goal MAP >65 Repeat lactate Trend UOP/ renal function Hold on empiric abx, monitor clinically   Afib Hx cardiomyopathy, HTN, HLD Prolonged QTc 0.529 - EF 60-65% on 12/26; suboptimal study - EKG non acute, trop neg P:  Tele monitor, rate currently controlled Hold on metoprolol and amiodarone  Monitor QTc Continue eliquis  Goal K >4; Mag >2 Need to consider TEE given suboptimal TTE  Urethral Stricture - s/p foley in ER P:  Assess daily for need Trend UOP Follow urine culture  Diabetes  P:  CBG and SSI sensitive levemir 10 units daily   Macrocytic Anemia P:  Hgb stable Trend on CBC  Hypothyroidism P:  Continue home synthroid  Dysphagia/ Aphasia  Malnutrition  P:  Start TF if not extubated 1/16  Best practice:  Diet: NPO Pain/Anxiety/Delirium protocol (if indicated): fentanyl/ versed VAP protocol (if indicated): yes DVT prophylaxis: eliquis GI prophylaxis: pepcid Glucose control: ssi Mobility: BR Code Status: Full.  Code status approached and family wishes to continue all aggressive care at this time.  Family Communication: Wife, Lupita Leash [phone]) and son, Barbara Cower [phone]) updated on plan of care  at bedside.  Disposition: ICU  Labs   CBC: Recent Labs  Lab 09/14/18 0629 09/15/18 0627 09/16/18 0520 09/17/18 0430 09/17/18 1938 09/17/18 1946  WBC 10.3 17.0* 17.3* 14.4* 12.3*  --   NEUTROABS  --   --   --   --  9.6*  --   HGB 10.7* 11.4* 12.2* 11.8* 12.1* 13.6  HCT 35.1* 36.1* 39.0 36.9* 40.2 40.0  MCV 102.9* 102.8* 102.4* 101.4* 105.8*  --   PLT 425* 512* 429* 436* 358  --     Basic Metabolic Panel: Recent Labs  Lab 09/14/18 0629 09/15/18 0627 09/16/18 0520 09/17/18 0430 09/17/18 1938 09/17/18 1946  NA 137 139 138 138 141 139  K 4.5 4.3 4.3 4.3 4.8 5.2*  CL 102 99 99 100 104 100  CO2 26 30 29 29 28   --   GLUCOSE 137* 134* 145* 137* 137* 143*  BUN 10  10 10 11 12 18   CREATININE 1.05 1.13 1.01 1.08 1.14 1.20  CALCIUM 8.9 9.3 9.1 9.2 8.8*  --    GFR: Estimated Creatinine Clearance: 71 mL/min (by C-G formula based on SCr of 1.2 mg/dL). Recent Labs  Lab 09/15/18 0627 09/16/18 0520 09/17/18 0430 09/17/18 1938 09/17/18 1947  WBC 17.0* 17.3* 14.4* 12.3*  --   LATICACIDVEN  --   --   --   --  2.18*    Liver Function Tests: Recent Labs  Lab 09/17/18 1938  AST 36  ALT 28  ALKPHOS 81  BILITOT 0.7  PROT 7.1  ALBUMIN 2.7*   Recent Labs  Lab 09/17/18 1938  LIPASE 49   No results for input(s): AMMONIA in the last 168 hours.  ABG    Component Value Date/Time   PHART 7.382 09/17/2018 2042   PCO2ART 60.4 (H) 09/17/2018 2042   PO2ART 201.0 (H) 09/17/2018 2042   HCO3 35.9 (H) 09/17/2018 2042   TCO2 38 (H) 09/17/2018 2042   ACIDBASEDEF 3.0 (H) 09/02/2018 0754   O2SAT 100.0 09/17/2018 2042     Coagulation Profile: No results for input(s): INR, PROTIME in the last 168 hours.  Cardiac Enzymes: No results for input(s): CKTOTAL, CKMB, CKMBINDEX, TROPONINI in the last 168 hours.  HbA1C: Hgb A1c MFr Bld  Date/Time Value Ref Range Status  08/28/2018 06:00 AM 7.1 (H) 4.8 - 5.6 % Final    Comment:    (NOTE)         Prediabetes: 5.7 - 6.4         Diabetes: >6.4         Glycemic control for adults with diabetes: <7.0     CBG: Recent Labs  Lab 09/16/18 2139 09/17/18 0604 09/17/18 1115 09/17/18 1624 09/17/18 1920  GLUCAP 126* 126* 124* 129* 135*    Review of Systems:   Unable  Past Medical History  He,  has a past medical history of Cardiomyopathy (HCC), Diabetes mellitus without complication (HCC), Dysrhythmia, GERD (gastroesophageal reflux disease), H/O: urethral stricture, History of abdominal aortic aneurysm (AAA), History of kidney stones (2017), Hyperlipidemia, Hypertension, Hypothyroidism, Morbid obesity with BMI of 40.0-44.9, adult (HCC) (2019), Peripheral vascular disease (HCC) (2019), Stroke (HCC) (2011),  Tremors of nervous system, and Vitamin D deficiency.   Surgical History    Past Surgical History:  Procedure Laterality Date  . APPENDECTOMY  1961  . CATARACT EXTRACTION, BILATERAL Bilateral   . CENTRAL LINE  08/29/2018      . COLONOSCOPY    . IR ANGIO INTRA EXTRACRAN SEL COM CAROTID INNOMINATE UNI  R MOD SED  08/27/2018  . IR ANGIO VERTEBRAL SEL SUBCLAVIAN INNOMINATE UNI L MOD SED  08/27/2018  . IR CT HEAD LTD  08/27/2018  . IR PERCUTANEOUS ART THROMBECTOMY/INFUSION INTRACRANIAL INC DIAG ANGIO  08/27/2018  . LOWER EXTREMITY ANGIOGRAPHY Left 03/05/2018   Procedure: LOWER EXTREMITY ANGIOGRAPHY;  Surgeon: Renford Dills, MD;  Location: [facility] INVASIVE CV LAB;  Service: Cardiovascular;  Laterality: Left;  . RADIOLOGY WITH ANESTHESIA N/A 08/27/2018   Procedure: IR WITH ANESTHESIA;  Surgeon: Radiologist, Medication, MD;  Location: [facility] OR;  Service: Radiology;  Laterality: N/A;  . TONSILLECTOMY       Social History   reports that he quit smoking about 36 years ago. His smoking use included cigarettes. He has a 120.00 pack-year smoking history. He has never used smokeless tobacco. He reports that he does not drink alcohol or use drugs.   Family History   His family history includes Diabetes in his father.   Allergies Allergies  Allergen Reactions  . Ace Inhibitors Other (See Comments)    unknown  . Omeprazole Diarrhea  . Statins Other (See Comments)    Abnormal lab results(pt unsure of what labs)     Home Medications  Prior to Admission medications   Medication Sig Start Date End Date Taking? Authorizing Provider  albuterol (PROVENTIL) (2.5 MG/3ML) 0.083% nebulizer solution Take 3 mLs (2.5 mg total) by nebulization every 6 (six) hours as needed for wheezing or shortness of breath. 09/16/18  Yes Layne Benton, NP  amiodarone (PACERONE) 200 MG tablet Take 1 tablet (200 mg total) by mouth daily. 09/17/18  Yes Layne Benton, NP  apixaban (ELIQUIS) 5 MG TABS tablet Take 1 tablet (5 mg  total) by mouth 2 (two) times daily. 09/16/18  Yes Layne Benton, NP  Cholecalciferol (VITAMIN D3) 1000 units CAPS Take 1,000 Units by mouth daily.    Yes [provider]  ezetimibe (ZETIA) 10 MG tablet Take 1 tablet (10 mg total) by mouth daily. 09/17/18  Yes Layne Benton, NP  famotidine (PEPCID) 20 MG tablet Take 20 mg by mouth 2 (two) times daily.   Yes [provider]  feeding supplement, GLUCERNA SHAKE, (GLUCERNA SHAKE) LIQD Take 237 mLs by mouth 2 (two) times daily between meals. 09/16/18  Yes Layne Benton, NP  fluticasone (FLONASE) 50 MCG/ACT nasal spray Place 2 sprays into both nostrils daily as needed for allergies or rhinitis.    Yes [provider]  insulin aspart (NOVOLOG) 100 UNIT/ML injection Inject 0-20 Units into the skin 3 (three) times daily with meals. 09/16/18  Yes Layne Benton, NP  insulin detemir (LEVEMIR) 100 UNIT/ML injection Inject 0.1 mLs (10 Units total) into the skin daily. 09/17/18  Yes Layne Benton, NP  levothyroxine (SYNTHROID, LEVOTHROID) 88 MCG tablet Take 88 mcg by mouth daily before breakfast.  03/22/17  Yes [provider]  metoprolol tartrate (LOPRESSOR) 25 MG tablet Take 1 tablet (25 mg total) by mouth 2 (two) times daily. 09/17/18  Yes Arline Asp, NP  Omega-3 Fatty Acids (FISH OIL) 1200 MG CAPS Take 1,200 mg by mouth 3 (three) times daily.    Yes [provider]  QUEtiapine (SEROQUEL) 50 MG tablet Take 1 tablet (50 mg total) by mouth at bedtime. 09/16/18  Yes Layne Benton, NP  senna-docusate (SENOKOT-S) 8.6-50 MG tablet Take 1 tablet by mouth at bedtime as needed for mild constipation. 09/16/18  Yes Layne Benton, NP  sulfamethoxazole-trimethoprim (BACTRIM,SEPTRA) 400-80 MG tablet  Take 1 tablet by mouth every 12 (twelve) hours for 6 doses. 09/16/18 09/19/18 Yes Layne BentonBiby, Sharon L, NP  vitamin C (ASCORBIC ACID) 500 MG tablet Take 500 mg by mouth daily.   Yes [provider]     Critical care time: 55  mins    Posey BoyerBrooke , MSN, AGACNP-BC [facility] Pgr: [phone] or if no answer [phone] 09/17/2018, 11:31 PM

## 2018-09-17 NOTE — Consult Note (Addendum)
Neurology Consultation  Reason for Consult: Altered mental status Referring Physician: Dr. Frederick Peersachel Little  CC: Altered mental status  History is obtained from: Chart, patient's family  HPI: Darren Frazier is a 78 y.o. 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 Emergency room evaluation noted the patient to be hypoxic and not following commands when the discharge summary from earlier in the day said that he was awake alert and following some commands with paucity of speech but able to say some words.  His exam was much different than what was documented in the discharge exam.  Due to his respiratory decline, he was emergently intubated in the emergency room.  Neurological consultation was obtained as the patient had a recent stroke and to get an opinion on whether this could be sequela of stroke versus seizure activity from recent strokes. There is no report by family or by nursing staff of any gaze preference or deviation as well as tonic-clonic activity. He was extremely hypoxic prior intubation and remain hypercarbic even after being intubated.  During recent hospitalization, patient's blood pressure remained labile and his respiratory status also remained labile with shortness of breath that persisted throughout admission.  Last known well- unclear-had a recent stroke control 25 2019 and was discharged this afternoon at some point.   ROS:  Unable to obtain due to altered mental status.   Past Medical History:  Diagnosis Date  . Cardiomyopathy (HCC)   . Diabetes mellitus without complication (HCC)   . Dysrhythmia    atrial  fibrillation  . GERD (gastroesophageal reflux disease)   . H/O: urethral stricture    self caths occasionally  . History of abdominal aortic aneurysm (AAA)    30/6234mm in diameter, stable  . History of kidney stones 2017  . Hyperlipidemia   . Hypertension   . Hypothyroidism   . Morbid obesity with BMI of 40.0-44.9, adult (HCC) 2019  . Peripheral vascular disease (HCC) 2019   lymphadema both extremities, ulceration on left big toe  . Stroke (HCC) 2011   x 2 within 6 months. some numbness over upper arms, balance off  . Tremors of nervous system   . Vitamin D deficiency     Family History  Problem Relation Age of Onset  . Diabetes Father    Social History:   reports that he quit smoking about 36 years ago. His smoking use included cigarettes. He has a 120.00 pack-year smoking history. He has never used smokeless tobacco. He reports that he does not drink alcohol or use drugs.  Medications  Current Facility-Administered Medications:  .  etomidate (AMIDATE) injection 38 mg, 38 mg, Intravenous, Once, Little, Ambrose Finlandachel Morgan, MD .  rocuronium [facility](ZEMURON) injection 130 mg, 130 mg, Intravenous, Once, Little, Ambrose Finlandachel Morgan, MD .  Melene Muller[START ON 09/18/2018] vancomycin (VANCOCIN) 1,750 mg in sodium chloride 0.9 % 500 mL IVPB, 1,750 mg, Intravenous, Q24H, Dang, Thuy D, RPH .  vancomycin (VANCOCIN) 2,500 mg in sodium chloride 0.9 % 500 mL IVPB, 2,500 mg, Intravenous, Once, Dang, Thuy D, RPH, Last Rate: 250 mL/hr at 09/17/18 2119, 2,500 mg at 09/17/18 2119  Current Outpatient Medications:  .  albuterol (PROVENTIL) (2.5 MG/3ML) 0.083% nebulizer solution, Take 3 mLs (2.5 mg  total) by nebulization every 6 (six) hours as needed for wheezing or shortness of breath., Disp: 75 mL, Rfl: 12 .  amiodarone (PACERONE) 200 MG tablet, Take 1 tablet (200 mg total) by mouth daily., Disp: , Rfl:  .  apixaban (ELIQUIS) 5 MG TABS tablet, Take 1 tablet (5 mg total) by mouth 2 (two) times daily., Disp: 60 tablet, Rfl:  .   Cholecalciferol (VITAMIN D3) 1000 units CAPS, Take 1,000 Units by mouth daily. , Disp: , Rfl:  .  ezetimibe (ZETIA) 10 MG tablet, Take 1 tablet (10 mg total) by mouth daily., Disp: , Rfl:  .  famotidine (PEPCID) 20 MG tablet, Take 20 mg by mouth 2 (two) times daily., Disp: , Rfl:  .  feeding supplement, GLUCERNA SHAKE, (GLUCERNA SHAKE) LIQD, Take 237 mLs by mouth 2 (two) times daily between meals., Disp: , Rfl: 0 .  fluticasone (FLONASE) 50 MCG/ACT nasal spray, Place 2 sprays into both nostrils daily as needed for allergies or rhinitis. , Disp: , Rfl:  .  insulin aspart (NOVOLOG) 100 UNIT/ML injection, Inject 0-20 Units into the skin 3 (three) times daily with meals., Disp: 10 mL, Rfl: 11 .  insulin detemir (LEVEMIR) 100 UNIT/ML injection, Inject 0.1 mLs (10 Units total) into the skin daily., Disp: 10 mL, Rfl: 11 .  levothyroxine (SYNTHROID, LEVOTHROID) 88 MCG tablet, Take 88 mcg by mouth daily before breakfast. , Disp: , Rfl:  .  metoprolol tartrate (LOPRESSOR) 25 MG tablet, Take 1 tablet (25 mg total) by mouth 2 (two) times daily., Disp: , Rfl:  .  Omega-3 Fatty Acids (FISH OIL) 1200 MG CAPS, Take 1,200 mg by mouth 3 (three) times daily. , Disp: , Rfl:  .  QUEtiapine (SEROQUEL) 50 MG tablet, Take 1 tablet (50 mg total) by mouth at bedtime., Disp: 30 tablet, Rfl: 0 .  senna-docusate (SENOKOT-S) 8.6-50 MG tablet, Take 1 tablet by mouth at bedtime as needed for mild constipation., Disp: , Rfl:  .  sulfamethoxazole-trimethoprim (BACTRIM,SEPTRA) 400-80 MG tablet, Take 1 tablet by mouth every 12 (twelve) hours for 6 doses., Disp: , Rfl:  .  vitamin C (ASCORBIC ACID) 500 MG tablet, Take 500 mg by mouth daily., Disp: , Rfl:   Exam: Current vital signs: BP (!) 85/57   Pulse (!) 113   Temp 99.3 F (37.4 C) (Bladder)   Resp 15   Ht 5\' 11"  (1.803 m)   Wt 130.5 kg   SpO2 96%   BMI 40.13 kg/m  Vital signs in last 24 hours: Temp:  [97.7 F (36.5 C)-99.3 F (37.4 C)] 99.3 F (37.4 C) (01/15  2125) Pulse Rate:  [81-134] 113 (01/15 2125) Resp:  [10-34] 15 (01/15 2125) BP: (80-152)/(49-91) 85/57 (01/15 2125) SpO2:  [90 %-99 %] 96 % (01/15 2125) FiO2 (%):  [80 %] 80 % (01/15 1939) Weight:  [130.5 kg] 130.5 kg (01/15 2122) Limited exam-done shortly after patient was given paralytics for intubation. General: Sedated intubated HEENT: Normocephalic atraumatic Lungs: Scattered rales Extremities warm well perfused Neurological examination Sedated intubated Not following any commands Breathing over the ventilator Pupils pinpoint and minimally reactive symmetric bilaterally Cough and gag present Oculocephalic reflex present No spontaneous movements No movement to noxious simulation Remained nonverbal NIHSS 1a Level of Conscious.: 2 1b LOC Questions: 2 1c LOC Commands: 2 2 Best Gaze: 0 3 Visual: 0 4 Facial Palsy: 0 5a Motor Arm - left: 4 5b Motor Arm - Right: 4 6a Motor Leg - Left: 4 6b Motor Leg - Right: 4  7 Limb Ataxia: 0 8 Sensory: 0 9 Best Language: 3 10 Dysarthria: un 11 Extinct. and Inatten.: 0 TOTAL: 25 (sedated, intubated)   Labs I have reviewed labs in epic and the results pertinent to this consultation are:  CBC    Component Value Date/Time   WBC 12.3 (H) 09/17/2018 1938   RBC 3.80 (L) 09/17/2018 1938   HGB 13.6 09/17/2018 1946   HGB 14.2 03/07/2013 1849   HCT 40.0 09/17/2018 1946   HCT 41.5 03/07/2013 1849   PLT 358 09/17/2018 1938   PLT 212 03/07/2013 1849   MCV 105.8 (H) 09/17/2018 1938   MCV 99 03/07/2013 1849   MCH 31.8 09/17/2018 1938   MCHC 30.1 09/17/2018 1938   RDW 14.8 09/17/2018 1938   RDW 13.2 03/07/2013 1849   LYMPHSABS 1.3 09/17/2018 1938   MONOABS 1.2 (H) 09/17/2018 1938   EOSABS 0.1 09/17/2018 1938   BASOSABS 0.1 09/17/2018 1938    CMP     Component Value Date/Time   NA 139 09/17/2018 1946   NA 137 03/07/2013 1849   K 5.2 (H) 09/17/2018 1946   K 3.6 03/07/2013 1849   CL 100 09/17/2018 1946   CL 101 03/07/2013 1849    CO2 28 09/17/2018 1938   CO2 26 03/07/2013 1849   GLUCOSE 143 (H) 09/17/2018 1946   GLUCOSE 135 (H) 03/07/2013 1849   BUN 18 09/17/2018 1946   BUN 18 03/07/2013 1849   CREATININE 1.20 09/17/2018 1946   CREATININE 1.06 03/07/2013 1849   CALCIUM 8.8 (L) 09/17/2018 1938   CALCIUM 9.4 03/07/2013 1849   PROT 7.1 09/17/2018 1938   PROT 7.6 03/07/2013 1849   ALBUMIN 2.7 (L) 09/17/2018 1938   ALBUMIN 3.9 03/07/2013 1849   AST 36 09/17/2018 1938   AST 51 (H) 03/07/2013 1849   ALT 28 09/17/2018 1938   ALT 56 03/07/2013 1849   ALKPHOS 81 09/17/2018 1938   ALKPHOS 89 03/07/2013 1849   BILITOT 0.7 09/17/2018 1938   BILITOT 0.3 03/07/2013 1849   GFRNONAA >60 09/17/2018 1938   GFRNONAA >60 03/07/2013 1849   GFRAA >60 09/17/2018 1938   GFRAA >60 03/07/2013 1849   Imaging I have reviewed the images obtained:  CT-scan of the brain showed expected evolution of the multifocal bilateral cortical infarcts.  Assessment:  78 year old male past history of cardiomyopathy diabetes atrial fibrillation currently on Eliquis, recent left cerebral hemispheric stroke from left ICA occlusion for which he underwent mechanical thrombectomy on 08/27/2018, discharged to nursing facility this afternoon and noted to be hypoxic and sent back to the ER for evaluation. Required emergent intubation as he will remain hypoxic prior to intubation and also was hypercarbic after intubation. Exam was limited because of the recent paralytics given for intubation. Current presentation of altered mental status likely toxic metabolic encephalopathy from hypoxia/hypercarbia. Evaluate for underlying respiratory infection. Less likely to be a seizure but if any seizure activity is noted no gaze preference or deviation noted and he does not start to come around with treatment of the infections, consider EEG.  Impression: Toxic metabolic encephalopathy  Recommendations: #Management of toxic metabolic derangements and respiratory  derangements per primary team as you are #EEG-routine EEG in the morning #Low suspicion for seizure hence no antiepileptics for now. #No bleed on CT head, continue Eliquis. #Stroke team will follow with you.  Decision on further imaging based on clinical course.  The son wanted to have a conversation with the medical teams regarding the prognosis.  I have discussed  with him the current situation and the fact that he needs to be stabilized prior to having any conversations but given the extent of strokes and slow recovery, he is likely to remain with significant deficits going forward.  This conversation needs to be continued and may be palliative care needs to be involved in his care.  I will defer this to the primary team and the stroke team.  -- Milon Dikes, MD [facility] Pager: [phone] If 7pm to 7am, please call on call as listed on AMION.  CRITICAL CARE ATTESTATION Performed by: Milon Dikes, MD Total critical care time: 40 minutes Critical care time was exclusive of separately billable procedures and treating other patients and/or supervising APPs/Residents/Students Critical care was necessary to treat or prevent imminent or life-threatening deterioration due to recent stroke, toxic metabolic encephalopathy, hypoxia. This patient is critically ill and at significant risk for neurological worsening and/or death and care requires constant monitoring. Critical care was time spent personally by me on the following activities: development of treatment plan with patient and/or surrogate as well as nursing, discussions with consultants, evaluation of patient's response to treatment, examination of patient, obtaining history from patient or surrogate, ordering and performing treatments and interventions, ordering and review of laboratory studies, ordering and review of radiographic studies, pulse oximetry, re-evaluation of patient's condition, participation in multidisciplinary  rounds and medical decision making of high complexity in the care of this patient.

## 2018-09-17 NOTE — Progress Notes (Addendum)
Pharmacy Antibiotic Note  Darren Frazier is a 78 y.o. male admitted on 09/17/2018 after being discharged earlier today post management of CVA.  Patient returned with AMS and SOB, requiring intubation in the ED.  Pharmacy has been consulted for vancomycin dosing for possible PNA.  Of note, patient was started on Rocephin on 09/15/18 and discharged on Septra this morning.  SCr 1.2, nCrCL 52 ml/min, afebrile, WBC 12.3.  Plan: Vanc 2500mg  IV x 1, then 1750mg  IV Q24H for AUC 537 using SCr 1.2 Change loading dose of cefepime to 2gm IV Monitor renal fxn, clinical progress, vanc AUC at Css F/U with continuation of Gram negative coverage  Height: 5\' 11"  (180.3 cm) IBW/kg (Calculated) : 75.3  Temp (24hrs), Avg:98 F (36.7 C), Min:97.7 F (36.5 C), Max:98.4 F (36.9 C)  Recent Labs  Lab 09/14/18 0629 09/15/18 0627 09/16/18 0520 09/17/18 0430 09/17/18 1938 09/17/18 1946 09/17/18 1947  WBC 10.3 17.0* 17.3* 14.4* 12.3*  --   --   CREATININE 1.05 1.13 1.01 1.08 1.14 1.20  --   LATICACIDVEN  --   --   --   --   --   --  2.18*    Estimated Creatinine Clearance: 71 mL/min (by C-G formula based on SCr of 1.2 mg/dL).    Allergies  Allergen Reactions  . Ace Inhibitors Other (See Comments)    unknown  . Omeprazole Diarrhea  . Statins Other (See Comments)    Abnormal lab results(pt unsure of what labs)    Vanc 1/15 >> Cefepime CTX 1/13 >> 1/13 Septra 1/14 >> 1/14  1/15 UCx -  1/15 BCx -    Gerrit Rafalski D. Laney Potash, PharmD, BCPS, BCCCP 09/17/2018, 8:38 PM

## 2018-09-17 NOTE — Plan of Care (Signed)
Pt being discharged to SNF, report given to RN at facility

## 2018-09-17 NOTE — ED Provider Notes (Signed)
[name] EMERGENCY DEPARTMENT Provider Note   CSN: [id number] Arrival date & time: 09/17/18  1912     History   Chief Complaint Chief Complaint  Patient presents with  . Altered Mental Status  . Shortness of Breath    HPI Darren Frazier is a 78 y.o. male.  78yo M w/ PMH including recent ischemic stroke, A fib on anticoagulation, carotid artery stenosis, AAA, T2DM, PVD who p/w altered mental status.  The patient was just discharged from the hospital earlier today to a rehab facility after a prolonged hospitalization for ischemic stroke.  Course was complicated by aspiration pneumonia.  Nursing documentation reports that he was alert and following commands when transport team arrived to pick him up.  Transport team reports that he had a progressive decline and was nonverbal during their transport.  They noted that his oxygen level was variable.  He has not been able to speak or follow any commands upon arrival back to the ED.  LEVEL 5  CAVEAT DUE TO AMS  The history is provided by the EMS personnel.    Past Medical History:  Diagnosis Date  . Cardiomyopathy ([place])   . Diabetes mellitus without complication ([place])   . Dysrhythmia    atrial fibrillation  . GERD (gastroesophageal reflux disease)   . H/O: urethral stricture    self caths occasionally  . History of abdominal aortic aneurysm (AAA)    30/46m in diameter, stable  . History of kidney stones 2017  . Hyperlipidemia   . Hypertension   . Hypothyroidism   . Morbid obesity with BMI of 40.0-44.9, adult ([place] 2019  . Peripheral vascular disease ([place] 2019   lymphadema both extremities, ulceration on left big toe  . Stroke ([place] 2011   x 2 within 6 months. some numbness over upper arms, balance off  . Tremors of nervous system   . Vitamin D deficiency     Patient Active Problem List   Diagnosis Date Noted  . Sinus pause 09/16/2018  . Aspiration pneumonitis ([facility] 09/15/2018  . Acute  respiratory failure ([facility] 09/15/2018  . Atrial fibrillation ([facility] 09/15/2018  . CHF (congestive heart failure) ([place] 09/15/2018  . 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 Hyperlipemia 09/15/2018  . Aphasia 09/15/2018  . Agitation 09/15/2018  . Morbid obesity ([facility] 09/15/2018  . OSA (obstructive sleep apnea) 09/15/2018  . 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 UTI (urinary tract infection) 09/15/2018  . Protein-calorie malnutrition ([facility] 09/15/2018  . Right hemiparesis ([facility] 08/27/2018  . Cerebral infarction due to occlusion of left carotid artery ([facility] 08/27/2018  . Essential hypertension 02/20/2018  . Diabetes ([facility] 02/20/2018  . Atherosclerosis of native artery of extremity ([place] 02/20/2018  . Chronic venous insufficiency 03/31/2017  . Lymphedema 03/31/2017  . Leg pain 03/31/2017    Past Surgical History:  Procedure Laterality Date  . APPENDECTOMY  1961  . CATARACT EXTRACTION, BILATERAL Bilateral   . CENTRAL LINE  08/29/2018      . COLONOSCOPY    . IR ANGIO INTRA EXTRACRAN SEL COM CAROTID INNOMINATE UNI R MOD SED  08/27/2018  . IR ANGIO VERTEBRAL SEL SUBCLAVIAN INNOMINATE UNI L MOD SED  08/27/2018  . IR CT HEAD LTD  08/27/2018  . IR PERCUTANEOUS ART THROMBECTOMY/INFUSION INTRACRANIAL INC DIAG ANGIO  08/27/2018  . LOWER EXTREMITY ANGIOGRAPHY Left 03/05/2018   Procedure: LOWER EXTREMITY ANGIOGRAPHY;  Surgeon: SKatha Cabal MD;  [facility];  Service: Cardiovascular;  Laterality: Left;  .  RADIOLOGY WITH ANESTHESIA N/A 08/27/2018   Procedure: IR WITH ANESTHESIA;  Surgeon: Radiologist, Medication, MD;  Location: [facility];  Service: Radiology;  Laterality: N/A;  . TONSILLECTOMY          Home Medications    Prior to Admission medications   Medication Sig Start Date End Date Taking? Authorizing Provider  albuterol (PROVENTIL) (2.5 MG/3ML) 0.083% nebulizer solution Take 3 mLs (2.5 mg total) by nebulization every 6 (six) hours as needed  for wheezing or shortness of breath. 09/16/18   Donzetta Starch, NP  amiodarone (PACERONE) 200 MG tablet Take 1 tablet (200 mg total) by mouth daily. 09/17/18   Donzetta Starch, NP  apixaban (ELIQUIS) 5 MG TABS tablet Take 1 tablet (5 mg total) by mouth 2 (two) times daily. 09/16/18   Donzetta Starch, NP  Cholecalciferol (VITAMIN D3) 1000 units CAPS Take 1,000 Units by mouth daily.     [provider]  ezetimibe (ZETIA) 10 MG tablet Take 1 tablet (10 mg total) by mouth daily. 09/17/18   Donzetta Starch, NP  famotidine (PEPCID) 20 MG tablet Take 20 mg by mouth 2 (two) times daily.    [provider]  feeding supplement, GLUCERNA SHAKE, (GLUCERNA SHAKE) LIQD Take 237 mLs by mouth 2 (two) times daily between meals. 09/16/18   Donzetta Starch, NP  fluticasone (FLONASE) 50 MCG/ACT nasal spray Place 2 sprays into both nostrils daily as needed for allergies or rhinitis.     [provider]  insulin aspart (NOVOLOG) 100 UNIT/ML injection Inject 0-20 Units into the skin 3 (three) times daily with meals. 09/16/18   Donzetta Starch, NP  insulin detemir (LEVEMIR) 100 UNIT/ML injection Inject 0.1 mLs (10 Units total) into the skin daily. 09/17/18   Donzetta Starch, NP  levothyroxine (SYNTHROID, LEVOTHROID) 88 MCG tablet Take 88 mcg by mouth daily before breakfast.  03/22/17   [provider]  metoprolol tartrate (LOPRESSOR) 25 MG tablet Take 1 tablet (25 mg total) by mouth 2 (two) times daily. 09/17/18   Vonzella Nipple, NP  Omega-3 Fatty Acids (FISH OIL) 1200 MG CAPS Take 1,200 mg by mouth 3 (three) times daily.     [provider]  QUEtiapine (SEROQUEL) 50 MG tablet Take 1 tablet (50 mg total) by mouth at bedtime. 09/16/18   Donzetta Starch, NP  senna-docusate (SENOKOT-S) 8.6-50 MG tablet Take 1 tablet by mouth at bedtime as needed for mild constipation. 09/16/18   Donzetta Starch, NP  sulfamethoxazole-trimethoprim (BACTRIM,SEPTRA) 400-80 MG tablet Take 1 tablet by mouth every 12  (twelve) hours for 6 doses. 09/16/18 09/19/18  Donzetta Starch, NP  vitamin C (ASCORBIC ACID) 500 MG tablet Take 500 mg by mouth daily.    [provider]    Family History Family History  Problem Relation Age of Onset  . Diabetes Father     Social History Social History   Tobacco Use  . Smoking status: Former Smoker    Packs/day: 4.00    Years: 30.00    Pack years: 120.00    Types: Cigarettes    Last attempt to quit: 1984    Years since quitting: 36.0  . Smokeless tobacco: Never Used  Substance Use Topics  . Alcohol use: No    Comment: occasional beer. couple years ago  . Drug use: No     Allergies   Ace inhibitors; Omeprazole; and Statins   Review of Systems Review of Systems  Unable to perform ROS:  Mental status change     Physical Exam Updated Vital Signs BP (!) 115/52   Pulse (!) 119   Resp 16   Ht _0  (1.803 m)   SpO2 98%   BMI 40.13 kg/m   Physical Exam Vitals signs and nursing note reviewed.  Constitutional:      Appearance: He is well-developed. He is toxic-appearing.     Comments: Altered, shallow breathing, non-verbal, ill  HENT:     Head: Normocephalic and atraumatic.  Eyes:     Conjunctiva/sclera: Conjunctivae normal.     Comments: Pupils 22m and equal, minimally reactive to light  Neck:     Comments: Head turned to R, difficult to turn leftward Cardiovascular:     Rate and Rhythm: Normal rate and regular rhythm.     Heart sounds: Normal heart sounds. No murmur.  Pulmonary:     Comments: Poor inspiratory effort, poor air movement, occasional expiratory wheezes b/l Abdominal:     General: Bowel sounds are normal. There is no distension.     Palpations: Abdomen is soft.  Musculoskeletal:     Right lower leg: No edema.  Skin:    General: Skin is warm and dry.     Comments: Skin breakdown R forearm  Neurological:     Comments: Altered, non-verbal, not following commands, no rigidity  Psychiatric:        Judgment: Judgment  normal.      ED Treatments / Results  Labs (all labs ordered are listed, but only abnormal results are displayed) Labs Reviewed  COMPREHENSIVE METABOLIC PANEL - Abnormal; Notable for the following components:      Result Value   Glucose, Bld 137 (*)    Calcium 8.8 (*)    Albumin 2.7 (*)    All other components within normal limits  ACETAMINOPHEN LEVEL - Abnormal; Notable for the following components:   Acetaminophen (Tylenol), Serum <10 (*)    All other components within normal limits  CBC WITH DIFFERENTIAL/PLATELET - Abnormal; Notable for the following components:   WBC 12.3 (*)    RBC 3.80 (*)    Hemoglobin 12.1 (*)    MCV 105.8 (*)    Neutro Abs 9.6 (*)    Monocytes Absolute 1.2 (*)    Abs Immature Granulocytes 0.11 (*)    All other components within normal limits  URINALYSIS, ROUTINE W REFLEX MICROSCOPIC - Abnormal; Notable for the following components:   Color, Urine AMBER (*)    APPearance CLOUDY (*)    Hgb urine dipstick LARGE (*)    Protein, ur 100 (*)    Leukocytes, UA LARGE (*)    RBC / HPF >50 (*)    WBC, UA >50 (*)    Non Squamous Epithelial 0-5 (*)    All other components within normal limits  GLUCOSE, CAPILLARY - Abnormal; Notable for the following components:   Glucose-Capillary 101 (*)    All other components within normal limits  I-STAT CHEM 8, ED - Abnormal; Notable for the following components:   Potassium 5.2 (*)    Glucose, Bld 143 (*)    TCO2 37 (*)    All other components within normal limits  I-STAT CG4 LACTIC ACID, ED - Abnormal; Notable for the following components:   Lactic Acid, Venous 2.18 (*)    All other components within normal limits  CBG MONITORING, ED - Abnormal; Notable for the following components:   Glucose-Capillary 135 (*)    All other components within normal limits  I-STAT  ARTERIAL BLOOD GAS, ED - Abnormal; Notable for the following components:   pCO2 arterial 60.4 (*)    pO2, Arterial 201.0 (*)    Bicarbonate 35.9 (*)      TCO2 38 (*)    Acid-Base Excess 9.0 (*)    All other components within normal limits  CULTURE, BLOOD (ROUTINE X 2)  URINE CULTURE  CULTURE, BLOOD (ROUTINE X 2)  CULTURE, RESPIRATORY  LIPASE, BLOOD  SALICYLATE LEVEL  PROTIME-INR  BRAIN NATRIURETIC PEPTIDE  MAGNESIUM  PHOSPHORUS  MAGNESIUM  BASIC METABOLIC PANEL  CBC  POTASSIUM  LACTIC ACID, PLASMA  I-STAT TROPONIN, ED  I-STAT CG4 LACTIC ACID, ED    EKG None  Radiology Vas [place] Groin Pseudoaneurysm  Result Date: 09/16/2018  ARTERIAL PSEUDOANEURYSM  Exam: Right groin History: Follow up pseudoaneurysm. Comparison Study: 09/04/2018 Performing Technologist: June Leap RDMS, RVT  Examination Guidelines: A complete evaluation includes B-mode imaging, spectral Doppler, color Doppler, and power Doppler as needed of all accessible portions of each vessel. Bilateral testing is considered an integral part of a complete examination. Limited examinations for reoccurring indications may be performed as noted. +------------+----------+--------+------+----------+ Right DuplexPSV (cm/s)WaveformPlaqueComment(s) +------------+----------+--------+------+----------+ CFA             77    biphasic                 +------------+----------+--------+------+----------+ Prox SFA        65    biphasic                 +------------+----------+--------+------+----------+ Right Vein comments:Patent common femoral vein.  Findings: An area with well defined borders measuring 4.0 cm x 2.2 cm was visualized arising off of the Right CFA with ultrasound characteristics of a pseudoaneurysm. Mixed echos within the structure suggest that it is partially thrombosed with a residual diameter  of 2.5 cm x 1.9 cm. The neck measures approximately 0.6 cm wide and 1.8 cm long.  Diagnosing physician: Harold Barban MD Electronically signed by Harold Barban MD on 09/16/2018 at 6:30:39 PM.   --------------------------------------------------------------------------------     Final    Vas [place] Lower Ext Arterial Pseudo Injection  Result Date: 09/17/2018  ARTERIAL PSEUDOANEURYSM  Exam: Right groin Performing Technologist: Abram Sander RVS Supporting Technologist: June Leap RDMS, RVT  Examination Guidelines: A complete evaluation includes B-mode imaging, spectral Doppler, color Doppler, and power Doppler as needed of all accessible portions of each vessel. Bilateral testing is considered an integral part of a complete examination. Limited examinations for reoccurring indications may be performed as noted.  Findings: An area with well defined borders measuring 2.1 cm x 2.3 cm was visualized with ultrasound characteristics of a pseudoaneurysm.  Summary: Successful thrombin injection into right groin pseudoaneurysm. Completed by Dr. Carlis Abbott.  Post injection the right SFA is patent.    --------------------------------------------------------------------------------    Preliminary     Procedures Procedure Name: Intubation Date/Time: 09/18/2018 12:15 AM Performed by: Sharlett Iles, MD Pre-anesthesia Checklist: Patient identified, Emergency Drugs available, Patient being monitored, Timeout performed and Suction available Oxygen Delivery Method: Non-rebreather mask Preoxygenation: Pre-oxygenation with 100% oxygen Induction Type: Rapid sequence Ventilation: Mask ventilation without difficulty Laryngoscope Size: Mac and 4 Grade View: Grade I Tube size: 8.0 mm Number of attempts: 1 Airway Equipment and Method: Stylet and Video-laryngoscopy Placement Confirmation: ETT inserted through vocal cords under direct vision Secured at: 25 cm Tube secured with: ETT holder Dental Injury: Teeth and Oropharynx as per pre-operative assessment  Future Recommendations: Recommend- induction with short-acting agent, and alternative techniques  readily available    .Critical Care Performed by: Sharlett Iles, MD Authorized by: Sharlett Iles, MD   Critical care  provider statement:    Critical care time (minutes):  45   Critical care time was exclusive of:  Separately billable procedures and treating other patients   Critical care was necessary to treat or prevent imminent or life-threatening deterioration of the following conditions:  CNS failure or compromise   Critical care was time spent personally by me on the following activities:  Development of treatment plan with patient or surrogate, discussions with consultants, evaluation of patient's response to treatment, examination of patient, obtaining history from patient or surrogate, ordering and performing treatments and interventions, ordering and review of laboratory studies, ordering and review of radiographic studies, re-evaluation of patient's condition and review of old charts   (including critical care time)  Medications Ordered in ED Medications  rocuronium (ZEMURON) injection 130 mg (130 mg Intravenous Not Given 09/17/18 1933)  etomidate (AMIDATE) injection 38 mg (38 mg Intravenous Not Given 09/17/18 1933)  etomidate (AMIDATE) injection (38 mg Intravenous Given 09/17/18 1932)  rocuronium (ZEMURON) injection (100 mg Intravenous Given 09/17/18 1933)     Initial Impression / Assessment and Plan / ED Course  I have reviewed the triage vital signs and the nursing notes.  Pertinent labs & imaging results that were available during my care of the patient were reviewed by me and considered in my medical decision making (see chart for details).    PT somnolent, non-verbal, shallow breathing, had to be placed on NRB for hypoxia. BG 100s. No narcotics on med list. Intubated for airway protection, see procedure note.  I reviewed his recent hospitalization including discharge summary from today.  It appears that he was having problems with fluctuating oxygen level but no shortness of breath symptoms.  However, it is documented that he was alert and following commands when he left. DDx is broad and  includes intracranial hemorrhage, status epilepticus, sepsis, respiratory failure.   Head CT stable. ABG reassuring. CXR w/ edema, ? Retrocardiac opacity, added HCAP antibiotic coverage. UA suggests infection. Consulted neuro, Dr. Rory Percy, to evaluate for consideration of seizures. Contacted CCM and patient admitted to MICU for further care.  Final Clinical Impressions(s) / ED Diagnoses   Final diagnoses:  Altered mental status, unspecified altered mental status type  Hypoxia    ED Discharge Orders    None       Preciosa Bundrick, Wenda Overland, MD 09/18/18 [phone]

## 2018-09-17 NOTE — Progress Notes (Signed)
Report given to Dennison Mascot at [facility]

## 2018-09-17 NOTE — Progress Notes (Signed)
Pseudoaneurysm injection has been completed.   Preliminary results in CV Proc.   Blanch Media 09/17/2018 8:53 AM

## 2018-09-18 ENCOUNTER — Inpatient Hospital Stay (HOSPITAL_COMMUNITY): Payer: Medicare HMO

## 2018-09-18 ENCOUNTER — Inpatient Hospital Stay: Payer: Self-pay

## 2018-09-18 DIAGNOSIS — I9589 Other hypotension: Secondary | ICD-10-CM

## 2018-09-18 DIAGNOSIS — I63412 Cerebral infarction due to embolism of left middle cerebral artery: Secondary | ICD-10-CM

## 2018-09-18 DIAGNOSIS — I509 Heart failure, unspecified: Secondary | ICD-10-CM

## 2018-09-18 DIAGNOSIS — R4182 Altered mental status, unspecified: Secondary | ICD-10-CM

## 2018-09-18 DIAGNOSIS — R0902 Hypoxemia: Secondary | ICD-10-CM

## 2018-09-18 DIAGNOSIS — I4891 Unspecified atrial fibrillation: Secondary | ICD-10-CM

## 2018-09-18 DIAGNOSIS — I6340 Cerebral infarction due to embolism of unspecified cerebral artery: Secondary | ICD-10-CM

## 2018-09-18 DIAGNOSIS — Z9989 Dependence on other enabling machines and devices: Secondary | ICD-10-CM

## 2018-09-18 LAB — CBC
HCT: 39.1 % (ref 39.0–52.0)
Hemoglobin: 12.2 g/dL — ABNORMAL LOW (ref 13.0–17.0)
MCH: 31.2 pg (ref 26.0–34.0)
MCHC: 31.2 g/dL (ref 30.0–36.0)
MCV: 100 fL (ref 80.0–100.0)
Platelets: 365 10*3/uL (ref 150–400)
RBC: 3.91 MIL/uL — ABNORMAL LOW (ref 4.22–5.81)
RDW: 14.5 % (ref 11.5–15.5)
WBC: 12.9 10*3/uL — ABNORMAL HIGH (ref 4.0–10.5)
nRBC: 0 % (ref 0.0–0.2)

## 2018-09-18 LAB — BASIC METABOLIC PANEL
ANION GAP: 15 (ref 5–15)
BUN: 17 mg/dL (ref 8–23)
CO2: 27 mmol/L (ref 22–32)
Calcium: 9.4 mg/dL (ref 8.9–10.3)
Chloride: 98 mmol/L (ref 98–111)
Creatinine, Ser: 1.51 mg/dL — ABNORMAL HIGH (ref 0.61–1.24)
GFR calc Af Amer: 51 mL/min — ABNORMAL LOW (ref >60)
GFR calc non Af Amer: 44 mL/min — ABNORMAL LOW (ref >60)
Glucose, Bld: 115 mg/dL — ABNORMAL HIGH (ref 70–99)
Potassium: 4.2 mmol/L (ref 3.5–5.1)
Sodium: 140 mmol/L (ref 135–145)

## 2018-09-18 LAB — GLUCOSE, CAPILLARY
GLUCOSE-CAPILLARY: 202 mg/dL — AB (ref 70–99)
Glucose-Capillary: 103 mg/dL — ABNORMAL HIGH (ref 70–99)
Glucose-Capillary: 147 mg/dL — ABNORMAL HIGH (ref 70–99)
Glucose-Capillary: 149 mg/dL — ABNORMAL HIGH (ref 70–99)
Glucose-Capillary: 164 mg/dL — ABNORMAL HIGH (ref 70–99)
Glucose-Capillary: 181 mg/dL — ABNORMAL HIGH (ref 70–99)

## 2018-09-18 LAB — POTASSIUM: Potassium: 4.8 mmol/L (ref 3.5–5.1)

## 2018-09-18 LAB — PHOSPHORUS: PHOSPHORUS: 3 mg/dL (ref 2.5–4.6)

## 2018-09-18 LAB — PROTIME-INR
INR: 1.73
Prothrombin Time: 20 seconds — ABNORMAL HIGH (ref 11.4–15.2)

## 2018-09-18 LAB — LACTIC ACID, PLASMA: Lactic Acid, Venous: 2.3 mmol/L (ref 0.5–1.9)

## 2018-09-18 LAB — BRAIN NATRIURETIC PEPTIDE: B Natriuretic Peptide: 1247.2 pg/mL — ABNORMAL HIGH (ref 0.0–100.0)

## 2018-09-18 LAB — MAGNESIUM
MAGNESIUM: 2 mg/dL (ref 1.7–2.4)
Magnesium: 1.9 mg/dL (ref 1.7–2.4)

## 2018-09-18 MED ORDER — PRO-STAT SUGAR FREE PO LIQD
30.0000 mL | Freq: Two times a day (BID) | ORAL | Status: DC
  Administered 2018-09-18 – 2018-09-19 (×3): 30 mL
  Filled 2018-09-18 (×3): qty 30

## 2018-09-18 MED ORDER — FUROSEMIDE 10 MG/ML IJ SOLN
40.0000 mg | Freq: Once | INTRAMUSCULAR | Status: AC
  Administered 2018-09-18: 40 mg via INTRAVENOUS
  Filled 2018-09-18: qty 4

## 2018-09-18 MED ORDER — VITAL HIGH PROTEIN PO LIQD: 1000.0000 mL | ORAL | Status: DC

## 2018-09-18 MED ORDER — METOPROLOL TARTRATE 25 MG/10 ML ORAL SUSPENSION
12.5000 mg | Freq: Two times a day (BID) | ORAL | Status: DC
  Administered 2018-09-18 (×2): 12.5 mg via ORAL
  Filled 2018-09-18 (×3): qty 5

## 2018-09-18 MED ORDER — SODIUM CHLORIDE 0.9% FLUSH: 10.0000 mL | INTRAVENOUS | Status: DC | PRN

## 2018-09-18 MED ORDER — VITAL HIGH PROTEIN PO LIQD
1000.0000 mL | ORAL | Status: DC
  Administered 2018-09-18 – 2018-09-19 (×2): 1000 mL

## 2018-09-18 MED ORDER — LIDOCAINE HCL (PF) 1 % IJ SOLN
INTRAMUSCULAR | Status: AC
  Filled 2018-09-18: qty 5

## 2018-09-18 MED ORDER — INSULIN ASPART 100 UNIT/ML ~~LOC~~ SOLN
0.0000 [IU] | SUBCUTANEOUS | Status: DC
  Administered 2018-09-18 (×2): 2 [IU] via SUBCUTANEOUS
  Administered 2018-09-18: 1 [IU] via SUBCUTANEOUS
  Administered 2018-09-18: 3 [IU] via SUBCUTANEOUS
  Administered 2018-09-18: 1 [IU] via SUBCUTANEOUS
  Administered 2018-09-19 (×2): 2 [IU] via SUBCUTANEOUS
  Administered 2018-09-19: 1 [IU] via SUBCUTANEOUS

## 2018-09-18 MED ORDER — CHLORHEXIDINE GLUCONATE CLOTH 2 % EX PADS
6.0000 | MEDICATED_PAD | Freq: Every day | CUTANEOUS | Status: DC
  Administered 2018-09-18 – 2018-09-23 (×6): 6 via TOPICAL

## 2018-09-18 MED ORDER — SODIUM CHLORIDE 0.9 % IV SOLN
2.0000 g | Freq: Two times a day (BID) | INTRAVENOUS | Status: DC
  Filled 2018-09-18: qty 2

## 2018-09-18 MED ORDER — SODIUM CHLORIDE 0.9% FLUSH
10.0000 mL | Freq: Two times a day (BID) | INTRAVENOUS | Status: DC
  Administered 2018-09-18 – 2018-09-29 (×20): 10 mL

## 2018-09-18 NOTE — Progress Notes (Signed)
eLink Physician-Brief Progress Note Patient Name: Darren Frazier DOB: 1941/03/03 MRN: [id number]   Date of Service  09/18/2018  HPI/Events of Note  Difficult vascular access for blood draws and medications  eICU Interventions  PICC line order placed.        Jonas Goh U Jhon Mallozzi 09/18/2018, 1:16 AM

## 2018-09-18 NOTE — Progress Notes (Addendum)
NEURO HOSPITALIST PROGRESS NOTE   Subjective: Patient wife is at bedside. Patient remains on contact precautions. Patient intubated not sedated. Able to follow commands. Other than being intubated and tachycardic patient is back to discharge baseline from yesterday.  Bilateral mittens and wrist restraints for safety. Exam: Vitals:   09/18/18 0722 09/18/18 0818  BP:  108/71  Pulse:    Resp:    Temp: 99.3 F (37.4 C)   SpO2:      Physical Exam   HEENT-  Normocephalic, no lesions, without obvious abnormality.  Normal external eye and conjunctiva.   Cardiovascular- S1-S2 audible,  Lungs-no excessive WOB, intubated. Abdomen- All 4 quadrants palpated and nontender Extremities- Warm, dry and intact Musculoskeletal-generalized edema present Skin-warm and dry, no hyperpigmentation, vitiligo, or suspicious lesions   Neuro:  Mental Status: Alert, oriented, intubated    Able to follow  commands without difficulty. Cranial Nerves: II: Visual fields grossly normal,  III,IV, VI: ptosis not present, extra-ocular motions intact bilaterally pupils equal, round, reactive to light and accommodation V,VII: face appears symmetric in presence of ETT, facial light touch sensation normal bilaterally VIII: hearing normal bilaterally IX,X: unable to visualize uvula d/t ETT XI: bilateral shoulder shrug XII: midline tongue extension Motor: Unable to move RUE. Flickers of muscle contraction when asked to squeeze hand with right hand.  LUE able to move antigravity. Strong hand grip. BLE in praffo boots. Able to wiggle toes.  Tone and bulk:normal tone throughout; no atrophy noted Sensory:  light touch intact throughout, bilaterally Cerebellar: Unable to perform d/t restraints Gait: deferred    Medications:  Scheduled: . apixaban  5 mg Oral BID  . chlorhexidine gluconate (MEDLINE KIT)  15 mL Mouth Rinse BID  . Chlorhexidine Gluconate Cloth  6 each Topical Daily  . etomidate   38 mg Intravenous Once  . famotidine  20 mg Oral BID  . feeding supplement (PRO-STAT SUGAR FREE 64)  30 mL Per Tube BID  . feeding supplement (VITAL HIGH PROTEIN)  1,000 mL Per Tube Q24H  . insulin aspart  0-9 Units Subcutaneous Q4H  . insulin detemir  10 Units Subcutaneous Daily  . levothyroxine  88 mcg Per Tube Q0600  . mouth rinse  15 mL Mouth Rinse 10 times per day  . metoprolol tartrate  12.5 mg Oral BID  . rocuronium  130 mg Intravenous Once  . sodium chloride flush  10-40 mL Intracatheter Q12H   Continuous: . sodium chloride 250 mL (09/17/18 2242)  . fentaNYL infusion INTRAVENOUS 50 mcg/hr (09/18/18 1200)  . phenylephrine (NEO-SYNEPHRINE) Adult infusion     INO:MVEHMCNOB, docusate, fentaNYL, levalbuterol, midazolam, sodium chloride flush  Pertinent Labs/Diagnostics:   Ct Head Wo Contrast  Result Date: 09/17/2018 CLINICAL DATA:  78 year old male presents unresponsive after being discharged earlier today. Recent history of stroke. EXAM: CT HEAD WITHOUT CONTRAST TECHNIQUE: Contiguous axial images were obtained from the base of the skull through the vertex without intravenous contrast. COMPARISON:  CTA head 09/10/2018; brain MRI 08/28/2018 FINDINGS: Brain: No new acute infarct identified. Continued evolution of a previously noted multifocal cortical infarcts without significant interval change or progression compared to 09/10/2018. Periventricular white matter hypoattenuation consistent with chronic microvascular ischemic white matter disease. Stable remote lacunar infarct of the left caudate head. Vascular: Atherosclerotic calcifications in the bilateral cavernous and supraclinoid internal carotid arteries. No hyperdense vascular sign. Skull: Normal. Negative for fracture or focal  lesion. Sinuses/Orbits: No acute finding. Other: None. IMPRESSION: 1. No new acute intracranial abnormality. 2. Expected continued evolution of a multifocal bilateral cortical infarcts. 3. Chronic microvascular  ischemic white matter disease. Electronically Signed   By: Jacqulynn Cadet M.D.   On: 09/17/2018 20:25   Dg Chest Port 1 View  Result Date: 09/17/2018 CLINICAL DATA:  Post intubation. EXAM: PORTABLE CHEST 1 VIEW COMPARISON:  Radiographs 09/15/2018 and 09/14/2018. FINDINGS: 1949 hours. Endotracheal tube tip is 3.3 cm above the carina. Enteric tube projects below the diaphragm with the tip projecting over the proximal stomach. Cardiomegaly, aortic atherosclerosis and pulmonary edema are again noted. There is mildly increased retrocardiac opacity which could reflect atelectasis or developing airspace disease. No pneumothorax or significant pleural effusion. IMPRESSION: 1. Satisfactory position of the endotracheal and enteric tubes. 2. Persistent edema suggesting congestive heart failure. Increasing retrocardiac pulmonary opacity. Electronically Signed   By: Richardean Sale M.D.   On: 09/17/2018 20:10   Vas [place] Groin Pseudoaneurysm  Result Date: 09/16/2018  ARTERIAL PSEUDOANEURYSM  Exam: Right groin History: Follow up pseudoaneurysm. Comparison Study: 09/04/2018 Performing Technologist: June Leap RDMS, RVT  Examination Guidelines: A complete evaluation includes B-mode imaging, spectral Doppler, color Doppler, and power Doppler as needed of all accessible portions of each vessel. Bilateral testing is considered an integral part of a complete examination. Limited examinations for reoccurring indications may be performed as noted. +------------+----------+--------+------+----------+ Right DuplexPSV (cm/s)WaveformPlaqueComment(s) +------------+----------+--------+------+----------+ CFA             77    biphasic                 +------------+----------+--------+------+----------+ Prox SFA        65    biphasic                 +------------+----------+--------+------+----------+ Right Vein comments:Patent common femoral vein.  Findings: An area with well defined borders measuring 4.0 cm x 2.2 cm  was visualized arising off of the Right CFA with ultrasound characteristics of a pseudoaneurysm. Mixed echos within the structure suggest that it is partially thrombosed with a residual diameter  of 2.5 cm x 1.9 cm. The neck measures approximately 0.6 cm wide and 1.8 cm long.  Diagnosing physician: Harold Barban MD Electronically signed by Harold Barban MD on 09/16/2018 at 6:30:39 PM.   --------------------------------------------------------------------------------    Final    Vas [place] Lower Ext Arterial Pseudo Injection  Result Date: 09/17/2018  ARTERIAL PSEUDOANEURYSM  Exam: Right groin Performing Technologist: Abram Sander RVS Supporting Technologist: June Leap RDMS, RVT  Examination Guidelines: A complete evaluation includes B-mode imaging, spectral Doppler, color Doppler, and power Doppler as needed of all accessible portions of each vessel. Bilateral testing is considered an integral part of a complete examination. Limited examinations for reoccurring indications may be performed as noted.  Findings: An area with well defined borders measuring 2.1 cm x 2.3 cm was visualized with ultrasound characteristics of a pseudoaneurysm.  Summary: Successful thrombin injection into right groin pseudoaneurysm. Completed by Dr. Carlis Abbott.  Post injection the right SFA is patent.  Diagnosing physician: Curt Jews MD Electronically signed by Curt Jews MD on 09/17/2018 at 47:08:45 PM.   --------------------------------------------------------------------------------    Final    [place] Ekg Site Rite  Result Date: 09/18/2018 If Site Rite image not attached, placement could not be confirmed due to current cardiac rhythm.  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 Required emergent intubation as he will remain hypoxic prior to intubation and also was hypercarbic after intubation. Exam was limited because of the recent paralytics given for intubation. Current presentation of altered mental status likely toxic metabolic encephalopathy from hypoxia/hypercarbia. Evaluate for underlying respiratory infection. Less likely to be a seizure but if any seizure activity is noted no gaze preference or deviation noted and he does not start to come around with treatment of the infections, consider EEG.  Impression: Toxic metabolic encephalopathy  Recommendations: #Management of toxic metabolic derangements and respiratory derangements per primary team as you are #EEG-pending #Low suspicion for seizure hence no antiepileptics for now. #No bleed on CT head, continue Eliquis. #Stroke team will follow with you.  Decision on further imaging based on clinical course.  Laurey Morale, MSN, NP-C [facility] [phone] 09/18/2018, 8:34 AM  ATTENDING NOTE: I reviewed above note and agree with the assessment and plan. Pt was seen and examined.   Pt wife at bedside. Pt was discharged to SNF however he was quickly sent back to ED due to hypoxia. He does have severe OSA and he had been refusing CPAP during last admission. His hypoxia could be chronic due to OSA. Pt was intubated in ER and was admitted to [facility] team. He was treated with vanco and cefepime overnight and had diuresis with lasix. This morning on rounding, he is drowsy, sleepy, afib RVR on tele and hypotension with BP 80s (cuff), but easily arousable, became agitated and trying to pull off ET tube, currently left hand in restraints.  Follows midline commands of open and close eyes, not quite follow peripheral commands on the left.  Still has left gaze preference, barely cross midline, not blinking to visual threat on the right.  Right hemiplegia.  Repeat CT head  overnight no acute finding. Currently on eliquis and zetia. Recommend to continue for stroke prevention. Appreciate CCM assistance and admitting pt. EEG pending but low suspicious for seizure. Will follow.   Rosalin Hawking, MD PhD Stroke Neurology 09/18/2018 1:36 PM  This patient is critically ill due to left MCA infarct, left ICA occlusion, respiratory failure, Afib RVR, hypotension and at significant risk of neurological worsening, death form recurrent stroke, hemorrhagic conversion, heart failure, seizure, PE. This patient's care requires constant monitoring of vital signs, hemodynamics, respiratory and cardiac monitoring, review of multiple databases, neurological assessment, discussion with family, other specialists and medical decision making of high complexity. I spent 40 minutes of neurocritical care time in the care of this patient. I had long discussion with wife at bedside, updated pt current condition, treatment plan and potential prognosis. She expressed understanding and appreciation.

## 2018-09-18 NOTE — Progress Notes (Signed)
eLink Physician-Brief Progress Note Patient Name: Darren Frazier DOB: May 12, 1941 MRN: [id number]   Date of Service  09/18/2018  HPI/Events of Note  Concern for self-extubation  eICU Interventions  Left soft wrist restraints ordered        Migdalia Dk 09/18/2018, 12:27 AM

## 2018-09-18 NOTE — Procedures (Signed)
History: 78 yo M with encephalopathy  Sedation: Fentanyl  Technique: This is a 21 channel routine scalp EEG performed at the bedside with bipolar and monopolar montages arranged in accordance to the international 10/20 system of electrode placement. One channel was dedicated to EKG recording.    Background: The background is dominated by generalized irregular delta activity.  There is some intermixed theta as well and there is a posterior dominant rhythm of 7 Hz which was seen at times.  Clear sleep was not recorded.  Photic stimulation: Physiologic driving is not performed  EEG Abnormalities: 1) generalized irregular slow activity 2) slow PDR  Clinical Interpretation: This EEG is consistent with a generalized nonspecific cerebral dysfunction (encephalopathy). There was no seizure or seizure predisposition recorded on this study. Please note that lack of epileptiform activity on EEG does not preclude the possibility of epilepsy.   Ritta Slot, MD [facility] [phone]  If 7pm- 7am, please page neurology on call as listed in AMION.

## 2018-09-18 NOTE — Consult Note (Signed)
WOC Nurse wound consult note Patient receiving care in [facility] 2M11.  MD and primary RN present at the time of my assessment.  No family present.  Patient intubated. Reason for Consult: Right abdomen wound Wound type: Unknown etiology.  On the right lower abdomen within the abdominal fold, there is a wound that measures 3.8 cm x 4.2 cm is 100% yellow slough and at the medial margin has a clearly discernible puncture hole.  I have reviewed the record and cannot determine what this area represents. Injury POA: Yes Drainage (amount, consistency, odor) No drainage from the puncture hole, no induration, no erythema; drops of drainage on the existing foam dressing.  The area is also suffering from MASD. Periwound: in the groin crease there is a fissure from MASD, otherwise moist. Dressing procedure/placement/frequency:  For the wound area:  For the right lower abdominal wound: Apply Skin prep (skin barrier) AROUND the wound. [place] # [phone]).  Allow to air dry. Place a thin hydrocolloid dressing Hart [place] # [id number]) over the wound.  Gently press in place. Date the dressing.  Change every 3 days and prn.  For the MASD:  Measure and cut length of InterDry Ag+ to fit in skin folds that have skin breakdown Tuck InterDry  Ag+ fabric into skin folds in a single layer, allow for 2 inches of overhang from skin edges to allow for wicking to occur May remove to bathe; dry area thoroughly and then tuck into affected areas again Do not apply any creams or ointments when using InterDry Ag+ DO NOT THROW AWAY FOR 5 DAYS unless soiled with stool DO NOT [facility] product, this will inactivate the silver in the material  New sheet of Interdry Ag+ should be applied after 5 days of use if patient continues to have skin breakdown.  These supplies have been ordered. Monitor the wound area(s) for worsening of condition such as: Signs/symptoms of infection,  Increase in size,  Development of or worsening of odor, Development of pain, or  increased pain at the affected locations.  Notify the medical team if any of these develop.  Thank you for the consult.  Discussed plan of care with the patient and bedside nurse.  WOC nurse will not follow at this time.  Please re-consult the WOC team if needed.  Helmut Muster, RN, MSN, CWOCN, CNS-BC, pager [phone]

## 2018-09-18 NOTE — Progress Notes (Signed)
EEG completed, results pending. 

## 2018-09-18 NOTE — Progress Notes (Signed)
Initial Nutrition Assessment  DOCUMENTATION CODES:   Obesity unspecified  INTERVENTION:    Vital High Protein at 60 ml/h (1440 ml per day)  Pro-stat 30 ml BID  Provides 1640 kcal, 156 gm protein, 1204 ml free water daily  NUTRITION DIAGNOSIS:   Inadequate oral intake related to inability to eat as evidenced by NPO status.  GOAL:   Provide needs based on ASPEN/SCCM guidelines  MONITOR:   Vent status, TF tolerance, Labs, Skin, I & O's  REASON FOR ASSESSMENT:   Ventilator, Consult Enteral/tube feeding initiation and management  ASSESSMENT:   78 yo male with PMH of HTN, HLD, DM, dysrhythmia, stroke (Dec 2019), PVD, tremors, morbid obesity, vitamin D deficiency who was re-admitted 1/15 with hypoxia. Just discharged 1/15, but sent right back to the hospital with hypoxia requiring intubation.  Received MD Consult for TF initiation and management. OGT in place.  Patient is currently intubated on ventilator support Temp (24hrs), Avg:98.4 F (36.9 C), Min:97.5 F (36.4 C), Max:99.3 F (37.4 C)   Labs reviewed. CBG's: 103-147 Medications reviewed and include Lasix, Novolog, Levemir, neosynephrine.    From review of weight encounters, patient has lost 7.5% of usual weight within the past month. Wife reports that this is mostly related to fluids because he was on Lasix.   Spoke with patient's wife at bedside. Over the past few weeks since initial admission on 12/25, PO intake has been limited. Patient had a poor appetite. He was on regular consistencies and thin liquids, but eating very poorly.   NUTRITION - FOCUSED PHYSICAL EXAM:    Most Recent Value  Orbital Region  No depletion  Upper Arm Region  No depletion  Thoracic and Lumbar Region  Unable to assess  Buccal Region  Unable to assess  Temple Region  No depletion  Clavicle Bone Region  Mild depletion  Clavicle and Acromion Bone Region  No depletion  Scapular Bone Region  Unable to assess  Dorsal Hand  Unable to  assess  Patellar Region  No depletion  Anterior Thigh Region  No depletion  Posterior Calf Region  No depletion  Edema (RD Assessment)  Mild  Hair  Reviewed  Eyes  Unable to assess  Mouth  Unable to assess  Skin  Reviewed  Nails  Unable to assess       Diet Order:   Diet Order            Diet NPO time specified  Diet effective now              EDUCATION NEEDS:   No education needs have been identified at this time  Skin:  Skin Integrity Issues:: Other (Comment) Other: 2 non pressure wounds to R hand, puncture wound R abdomen  Last BM:  1/15  Height:   Ht Readings from Last 1 Encounters:  09/17/18 5\' 11"  (1.803 m)   Weight:   Wt Readings from Last 1 Encounters:  09/18/18 125.9 kg    Ideal Body Weight:  78.2 kg  BMI:  Body mass index is 38.71 kg/m.  Estimated Nutritional Needs:   Kcal:  1103-1594  Protein:  156 gm  Fluid:  2 L    Joaquin Courts, RD, LDN, CNSC Pager [phone] After Hours Pager [phone]

## 2018-09-18 NOTE — Progress Notes (Signed)
CRITICAL VALUE ALERT  Critical Value:  Lactic acid 2.3  Date & Time Notied:  09/18/18 0400  Provider Notified: Dr. Warrick Parisian  Orders Received/Actions taken: awaiting orders

## 2018-09-18 NOTE — Progress Notes (Signed)
eLink Physician-Brief Progress Note Patient Name: Darren Frazier DOB: [birth year] MRN: [id number]   Date of Service  09/18/2018  HPI/Events of Note  Restraint order needs to be renewed. Pt is on mechanical ventilation.  eICU Interventions  Order renewed.        Thomasene Lot Khaya Theissen 09/18/2018, 11:37 PM

## 2018-09-18 NOTE — Progress Notes (Addendum)
NAME:  Darren Frazier, MRN:  [id number], DOB:  [birth year], LOS: 1 ADMISSION DATE:  09/17/2018, CONSULTATION DATE:  09/17/2018 REFERRING MD:  Dr. Clarene Duke, CHIEF COMPLAINT:  Respiratory failure    Brief History   78 yoM with was discharged today after hospitalization 12/25 -1/15 for acute stroke. Was refused at rehab facility due to hypoxia.  Required intubation for airway protection on arrival to ER.  Head CT neg.  CXR c/w pulmonary edema.   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 No new acute intracranial abnormality. 2. Expected continued evolution of a multifocal bilateral cortical infarcts. 3. Chronic microvascular ischemic white matter disease.  1/15 CXR >>  1. Satisfactory position of the endotracheal and enteric tubes. 2. Persistent edema suggesting congestive heart failure. Increasing retrocardiac pulmonary opacity.  Micro Data:  1/15 BCx2 >>  Antimicrobials:  vanc and cefepime x 1 in ER 1/15  Interim history/subjective:  Overnight: Tachycardia, A. fib on telemetry monitor  Today, [name] is awake and responds to verbal commands.  He denies chest pain, shortness of breath, dysuria, fevers or chills.  Objective   Blood pressure 101/64, pulse (!) 116, temperature 98.1 F (36.7 C), temperature source Oral, resp. rate 15, height 5\' 11"   (1.803 m), weight 125.9 kg, SpO2 98 %.    Vent Mode: PRVC FiO2 (%):  [60 %-100 %] 60 % Set Rate:  [15 bmp] 15 bmp Vt Set:  [600 mL] 600 mL PEEP:  [5 cmH20] 5 cmH20 Plateau Pressure:  [17 cmH20-18 cmH20] 18 cmH20   Intake/Output Summary (Last 24 hours) at 09/18/2018 0704 Last data filed at 09/18/2018 0400 Gross per 24 hour  Intake 121.06 ml  Output 1490 ml  Net -1368.94 ml   Filed Weights   09/17/18 2122 09/18/18 0500  Weight: 130.5 kg 125.9 kg    Examination: General: No acute distress, awake and alert. HENT: ET tube in place Lungs: Bibasilar rales, rhonchi Cardiovascular: Irregular, distant heart sounds Abdomen: Sounds present, nontender to palpation Extremities: No pedal edema Neuro: Awake, alert, responds to verbal commands.  Flaccid paralysis of the right upper extremity and right lower extremity.  Sensation and strength intact on the left upper and lower extremity. Skin: 4 x 4 irregularly shaped superficial wound located at the right sided lower abdominal skin folds.  Resolved Hospital Problem list     Assessment & Plan:  78 year old gentleman with nonischemic cardiomyopathy EF 60 to 65%, large MCA infarct, type 2 diabetes, hypertension, atrial fibrillation presenting with acute on chronic anemia  #Acute hypoxic respiratory failure  #Acute on chronic diastolic heart failure exacerbation  Initial chest x-ray with findings of pulmonary edema.  Differential diagnosis includes cardiogenic vs pulmonary embolism vs high output pulmonary edema.  Pulmonary hypertension less likely as it was not evident on his recent echocardiogram.  Given his history of atrial fibrillation, A. fib with RVR could have well pushed him into worsening heart failure. - IV Lasix 40 mg  x 1 dose -Noted to have elevated BNP of 1200, lactic acidosis (2.3<2.1) -Continue full mechanical ventilation -VAP precaution -WUA/SBT -ABG showed chronic hypercarbic respiratory acidosis. Given his recent history of  CVA, he will require assistance with ventilation with CPAP or BiPAP  #Acute encephalopathy (DDX metabolic vs toxic vs infectious vs structural) Hx of large left MCA infarction w/ left ICA stenosis with residual right hemiplegia #Dysphagia -CT head on admission unremarkable -Continue neurochecks -Start tube feeds if he does not pass SBT today   #History of paroxysmal atrial fibrillation #Tachybradycardia syndrome -EKG assistant with A. fib RVR with heart rate in the 120s. -Continue Eliquis -Start low dose metoprolol tartrate 12.5mg  BID for rate control   #History of cardiomyopathy  #Prolonged QTC on EKG -Most recent echocardiogram in 2019 reviewed and ejection fraction of 60 to 65% though study was suboptimal.  Echocardiogram in 2018 showed EF 50% with normal systolic function. -Will require TEE to better evaluate structural function -Continue telemetry monitoring -Hold metoprolol and amiodarone -Goal of potassium above 4 and magnesium above 2  #Urinary tract infection ?Sepsis Urinalysis on admission revealed pyuria without bacteria -Started on cefepime and vancomycin in ED, discontinued today.  #Hypertension -BP stable though labile  #Hyperlipidemia - Can resume Zetia  #Type 2 diabetes mellitus - Daily CBG -Sliding scale insulin, sensitive -Levemir 10 units daily  #Macrocytic anemia -Hemoglobin stable -Follow-up CBC  #Urethral stricture -Keep Foley catheter in place -Follow-up urine output -Follow-up urine culture  #Hypothyroidism -Continue home Synthroid   Best practice:  Diet: NPO Pain/Anxiety/Delirium protocol (if indicated): fentanyl/ versed VAP protocol (if indicated): yes DVT prophylaxis: eliquis GI prophylaxis: pepcid Glucose control: ssi Mobility: BR Code Status: Full.  Code status approached and family wishes to continue all aggressive care at this time.  Family Communication: Wife, Lupita Leash [phone]) and son, Barbara Cower [phone]) Disposition:  ICU  Labs  CBC: Recent Labs  Lab 09/14/18 0629 09/15/18 0627 09/16/18 0520 09/17/18 0430 09/17/18 1938 09/17/18 1946  WBC 10.3 17.0* 17.3* 14.4* 12.3*  --   NEUTROABS  --   --   --   --  9.6*  --   HGB 10.7* 11.4* 12.2* 11.8* 12.1* 13.6  HCT 35.1* 36.1* 39.0 36.9* 40.2 40.0  MCV 102.9* 102.8* 102.4* 101.4* 105.8*  --   PLT 425* 512* 429* 436* 358  --     Basic Metabolic Panel: Recent Labs  Lab 09/14/18 0629 09/15/18 0627 09/16/18 0520 09/17/18 0430 09/17/18 1938 09/17/18 1946 09/18/18 0241  NA 137 139 138 138 141 139  --   K 4.5 4.3 4.3 4.3 4.8 5.2* 4.8  CL 102 99 99 100 104 100  --   CO2 26 30 29 29 28   --   --   GLUCOSE 137* 134* 145* 137* 137* 143*  --   BUN 10 10 10 11 12 18   --   CREATININE 1.05 1.13 1.01 1.08 1.14 1.20  --   CALCIUM 8.9 9.3 9.1 9.2 8.8*  --   --   MG  --   --   --   --   --   --  2.0   GFR: Estimated Creatinine Clearance: 69.6 mL/min (by C-G formula based on SCr of 1.2 mg/dL). Recent Labs  Lab 09/15/18 0627 09/16/18 0520 09/17/18 0430 09/17/18 1938 09/17/18 1947 09/18/18 0241  WBC 17.0* 17.3* 14.4* 12.3*  --   --   LATICACIDVEN  --   --   --   --  2.18* 2.3*    Liver  Function Tests: Recent Labs  Lab 09/17/18 1938  AST 36  ALT 28  ALKPHOS 81  BILITOT 0.7  PROT 7.1  ALBUMIN 2.7*   Recent Labs  Lab 09/17/18 1938  LIPASE 49   No results for input(s): AMMONIA in the last 168 hours.  ABG    Component Value Date/Time   PHART 7.382 09/17/2018 2042   PCO2ART 60.4 (H) 09/17/2018 2042   PO2ART 201.0 (H) 09/17/2018 2042   HCO3 35.9 (H) 09/17/2018 2042   TCO2 38 (H) 09/17/2018 2042   ACIDBASEDEF 3.0 (H) 09/02/2018 0754   O2SAT 100.0 09/17/2018 2042     Coagulation Profile: Recent Labs  Lab 09/18/18 0241  INR 1.73    Cardiac Enzymes: No results for input(s): CKTOTAL, CKMB, CKMBINDEX, TROPONINI in the last 168 hours.  HbA1C: Hgb A1c MFr Bld  Date/Time Value Ref Range Status  08/28/2018 06:00 AM 7.1 (H) 4.8 - 5.6  % Final    Comment:    (NOTE)         Prediabetes: 5.7 - 6.4         Diabetes: >6.4         Glycemic control for adults with diabetes: <7.0     CBG: Recent Labs  Lab 09/17/18 1115 09/17/18 1624 09/17/18 1920 09/17/18 2340 09/18/18 0336  GLUCAP 124* 129* 135* 101* 103*

## 2018-09-18 NOTE — Progress Notes (Signed)
Peripherally Inserted Central Catheter/Midline Placement  The IV Nurse has discussed with the patient and/or persons authorized to consent for the patient, the purpose of this procedure and the potential benefits and risks involved with this procedure.  The benefits include less needle sticks, lab draws from the catheter, and the patient may be discharged home with the catheter. Risks include, but not limited to, infection, bleeding, blood clot (thrombus formation), and puncture of an artery; nerve damage and irregular heartbeat and possibility to perform a PICC exchange if needed/ordered by physician.  Alternatives to this procedure were also discussed.  Bard Power PICC patient education guide, fact sheet on infection prevention and patient information card has been provided to patient /or left at bedside.    PICC/Midline Placement Documentation  PICC Double Lumen 09/18/18 PICC Right Basilic 44 cm 0 cm (Active)  Indication for Insertion or Continuance of Line Poor Vasculature-patient has had multiple peripheral attempts or PIVs lasting less than 24 hours 09/18/2018 10:00 AM  Exposed Catheter (cm) 0 cm 09/18/2018 10:00 AM  Site Assessment Clean;Dry;Intact 09/18/2018 10:00 AM  Lumen #1 Status Flushed;Blood return noted 09/18/2018 10:00 AM  Lumen #2 Status Flushed;Blood return noted 09/18/2018 10:00 AM  Dressing Type Transparent 09/18/2018 10:00 AM  Dressing Status Dry;Clean;Intact 09/18/2018 10:00 AM  Dressing Change Due 09/25/18 09/18/2018 10:00 AM    Telephone consent   Stacie Glaze Horton 09/18/2018, 10:50 AM

## 2018-09-19 ENCOUNTER — Inpatient Hospital Stay (HOSPITAL_COMMUNITY): Payer: Medicare HMO

## 2018-09-19 DIAGNOSIS — J810 Acute pulmonary edema: Secondary | ICD-10-CM

## 2018-09-19 DIAGNOSIS — I4821 Permanent atrial fibrillation: Secondary | ICD-10-CM

## 2018-09-19 DIAGNOSIS — I6521 Occlusion and stenosis of right carotid artery: Secondary | ICD-10-CM

## 2018-09-19 DIAGNOSIS — Z95828 Presence of other vascular implants and grafts: Secondary | ICD-10-CM

## 2018-09-19 DIAGNOSIS — I6522 Occlusion and stenosis of left carotid artery: Secondary | ICD-10-CM

## 2018-09-19 LAB — MAGNESIUM: Magnesium: 2 mg/dL (ref 1.7–2.4)

## 2018-09-19 LAB — CBC
HCT: 33.3 % — ABNORMAL LOW (ref 39.0–52.0)
Hemoglobin: 10.3 g/dL — ABNORMAL LOW (ref 13.0–17.0)
MCH: 31.2 pg (ref 26.0–34.0)
MCHC: 30.9 g/dL (ref 30.0–36.0)
MCV: 100.9 fL — AB (ref 80.0–100.0)
Platelets: 313 10*3/uL (ref 150–400)
RBC: 3.3 MIL/uL — ABNORMAL LOW (ref 4.22–5.81)
RDW: 14.5 % (ref 11.5–15.5)
WBC: 10 10*3/uL (ref 4.0–10.5)
nRBC: 0 % (ref 0.0–0.2)

## 2018-09-19 LAB — GLUCOSE, CAPILLARY
Glucose-Capillary: 147 mg/dL — ABNORMAL HIGH (ref 70–99)
Glucose-Capillary: 172 mg/dL — ABNORMAL HIGH (ref 70–99)
Glucose-Capillary: 180 mg/dL — ABNORMAL HIGH (ref 70–99)
Glucose-Capillary: 104 mg/dL — ABNORMAL HIGH (ref 70–99)
Glucose-Capillary: 108 mg/dL — ABNORMAL HIGH (ref 70–99)
Glucose-Capillary: 108 mg/dL — ABNORMAL HIGH (ref 70–99)

## 2018-09-19 LAB — BASIC METABOLIC PANEL
Anion gap: 11 (ref 5–15)
BUN: 26 mg/dL — ABNORMAL HIGH (ref 8–23)
CO2: 31 mmol/L (ref 22–32)
Calcium: 9 mg/dL (ref 8.9–10.3)
Chloride: 98 mmol/L (ref 98–111)
Creatinine, Ser: 1.68 mg/dL — ABNORMAL HIGH (ref 0.61–1.24)
GFR calc Af Amer: 45 mL/min — ABNORMAL LOW (ref >60)
GFR calc non Af Amer: 39 mL/min — ABNORMAL LOW (ref >60)
Glucose, Bld: 159 mg/dL — ABNORMAL HIGH (ref 70–99)
Potassium: 3.5 mmol/L (ref 3.5–5.1)
Sodium: 140 mmol/L (ref 135–145)

## 2018-09-19 LAB — PHOSPHORUS: Phosphorus: 3.6 mg/dL (ref 2.5–4.6)

## 2018-09-19 LAB — TSH: TSH: 6.627 u[IU]/mL — ABNORMAL HIGH (ref 0.350–4.500)

## 2018-09-19 LAB — URINE CULTURE: Culture: <10000 — AB

## 2018-09-19 MED ORDER — ORAL CARE MOUTH RINSE
15.0000 mL | Freq: Two times a day (BID) | OROMUCOSAL | Status: DC
  Administered 2018-09-19 – 2018-09-20 (×3): 15 mL via OROMUCOSAL

## 2018-09-19 MED ORDER — METOPROLOL TARTRATE 5 MG/5ML IV SOLN
2.5000 mg | Freq: Two times a day (BID) | INTRAVENOUS | Status: DC
  Administered 2018-09-20 (×2): 2.5 mg via INTRAVENOUS
  Filled 2018-09-19 (×2): qty 5

## 2018-09-19 MED ORDER — LEVOTHYROXINE SODIUM 100 MCG/5ML IV SOLN
50.0000 ug | Freq: Every day | INTRAVENOUS | Status: DC
  Administered 2018-09-20: 50 ug via INTRAVENOUS
  Filled 2018-09-19: qty 5

## 2018-09-19 MED ORDER — METOPROLOL TARTRATE 5 MG/5ML IV SOLN
5.0000 mg | Freq: Once | INTRAVENOUS | Status: AC
  Administered 2018-09-19: 5 mg via INTRAVENOUS
  Filled 2018-09-19: qty 5

## 2018-09-19 MED ORDER — METOPROLOL TARTRATE 25 MG/10 ML ORAL SUSPENSION
25.0000 mg | Freq: Two times a day (BID) | ORAL | Status: DC
  Administered 2018-09-19: 25 mg via ORAL
  Filled 2018-09-19: qty 10

## 2018-09-19 MED ORDER — INSULIN DETEMIR 100 UNIT/ML ~~LOC~~ SOLN
5.0000 [IU] | Freq: Two times a day (BID) | SUBCUTANEOUS | Status: DC
  Administered 2018-09-20 (×2): 5 [IU] via SUBCUTANEOUS
  Filled 2018-09-19 (×3): qty 0.05

## 2018-09-19 NOTE — Progress Notes (Addendum)
NEURO HOSPITALIST PROGRESS NOTE   Subjective: Patient wife at bedside this am, hoping to see a critical care MD today. Patient remains on contact precautions for MRSA. Patient remains intubated but weaning, on low dose fentanyl drip. Able to follow commands. Bilateral mittens and wrist restraints for safety. Neurologically stable.   Exam: Vitals:   09/19/18 1121 09/19/18 1200  BP:  (!) 116/54  Pulse:  86  Resp:  18  Temp: 98.4 F (36.9 C)   SpO2: 96% 96%    Physical Exam  HEENT-  Normocephalic, no lesions, without obvious abnormality.  Normal external eye and conjunctiva.   Cardiovascular- S1-S2 audible,  Lungs - no excessive WOB, intubated. Extremities- Warm, dry and intact Skin-warm and dry  Neuro:  Mental Status: Alert, oriented to person and place, intubated    Able to follow 1 step commands  Cranial Nerves: II: not blinking to threat on the R III,IV, VI: ptosis not present, extra-ocular motions intact bilaterally pupils equal, round, reactive to light and accommodation V,VII: face appears symmetric in presence of ETT, facial light touch sensation normal bilaterally VIII: hearing normal bilaterally IX,X: unable to visualize uvula d/t ETT XI: bilateral shoulder shrug XII: midline tongue extension Motor: RUE hemiparesis with flickers of muscle contraction when asked to squeeze hand with right hand.  LUE able to move antigravity. Strong hand grip. RLE flaccid. BLE in praffo boots. Able to wiggle toes on the L.  Tone and bulk:normal tone throughout; no atrophy noted Sensory:  light touch intact throughout, bilaterally Cerebellar: Unable to perform d/t restraints Gait: deferred    Medications:  Scheduled: . apixaban  5 mg Oral BID  . Chlorhexidine Gluconate Cloth  6 each Topical Daily  . famotidine  20 mg Oral BID  . feeding supplement (PRO-STAT SUGAR FREE 64)  30 mL Per Tube BID  . feeding supplement (VITAL HIGH PROTEIN)  1,000 mL Per Tube Q24H   . insulin aspart  0-9 Units Subcutaneous Q4H  . insulin detemir  5 Units Subcutaneous BID  . [START ON 09/20/2018] levothyroxine  50 mcg Intravenous Daily  . metoprolol tartrate  2.5 mg Intravenous Q12H  . sodium chloride flush  10-40 mL Intracatheter Q12H   Continuous: . sodium chloride 10 mL/hr at 09/19/18 1200  . fentaNYL infusion INTRAVENOUS Stopped (09/19/18 1031)  . phenylephrine (NEO-SYNEPHRINE) Adult infusion     WNU:[id number], docusate, fentaNYL, levalbuterol, midazolam, sodium chloride flush  Pertinent Labs/Diagnostics:  Dg Chest Port 1 View  Result Date: 09/19/2018 CLINICAL DATA:  Respiratory failure EXAM: PORTABLE CHEST 1 VIEW COMPARISON:  09/18/2018 FINDINGS: Cardiac shadow is enlarged but stable. Aortic calcifications are again seen. Endotracheal tube, nasogastric catheter and right-sided PICC line are again noted and stable. Inspiratory effort is poor. Left basilar atelectasis and effusion is again noted. Vascular congestion is again seen although extension weighted by the poor inspiratory effort. IMPRESSION: Tubes and lines as described. Stable left basilar atelectasis with vascular congestion. Electronically Signed   By: Alcide Clever M.D.   On: 09/19/2018 07:27     Assessment: 78 year old male past history of cardiomyopathy, diabetes, atrial fibrillation currently on Eliquis, recent left cerebral hemispheric stroke from left ICA occlusion for which he underwent mechanical thrombectomy on 08/27/2018, discharged to skilled nursing facility 09/17/2017 where he was noted to be hypoxic and sent back to the ER for evaluation. In the ED, required emergent intubation. Current presentation of  altered mental status likely toxic metabolic encephalopathy from hypoxia/hypercarbia. EEG negative for seizure. no indication for antiepileptics No new stroke  Impression: Toxic metabolic encephalopathy  Recommendations: #Management of toxic metabolic derangements and respiratory  derangements per primary team  #continue Eliquis and zetia at d/c for secondary stroke prevention #follow up Stroke clinic in 4 weeks. Request placed 09/17/5017 at d/c. Office will call with appt date and times. #Stroke team will sign off. Please call for any further concerns related to stroke   Annie MainSharon Biby, MSN, APRN, ANVP-BC, AGPCNP-BC Advanced Practice Stroke Nurse [facility] See Amion for Schedule & Pager information 09/19/2018 1:34 PM   ATTENDING NOTE: I reviewed above note and agree with the assessment and plan. Pt was seen and examined.   Patient was extubated in the afternoon, awake alert, eyes open, follow simple commands, still has left gaze preference and right hemiplegia.  Moving left arm and lower extremity spontaneously.  Not orientated to place time or situation, however orientated to self and age.  Patient clinical condition much improved after respiratory failure was treated by CCM.  Assessment & plan  Mr.Darren Elie GoodyOgden Markhamis a 78 y.o.malewith history of HTN, atrial fibrillation on Pradaxa last dose last evening, urethral stricture, cardiomyopathy, peripheral vascular disease, hypertension, hyperlipidemia, stroke with some balance issues, bilateral lymphedema vitamin D deficiencypresenting with right lower extremity numbness and weaknessand suspected new Lt ICA occlusion.Hedid not receive IV t-PA due to late presentation.  Stroke:Lt MCA and ACA territory scattered small infarcts - embolic pattern, likely due to left ICA occlusion.  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  MRI head -left MCA and ACA territory scattered small infarcts  CTA H&N- left ICA, right VA occlusion. 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  DSA -Lt ICA occlusion not able to be revascularized, 90 % stenosis of RT ICA prox-70% stenosis of prox basilar  artery- severte stenosis of dominant LT VBJ distal to Lt PICA and Occluded RT VA prox.  2D Echo - EF 60 to 65%  LDL- 81  HgbA1c- 7.1  aspirin 81 mg daily and Pradaxa (dabigatran) twice a dayprior to admission, now on Eliquis 5 mg twice daily  Therapy recommendations: SNF  Disposition: SNF  Respiratory failure  Was intubated on ventilation  Now extubated  So far on nasal cannula, tolerating well  CCM on board  Atrial fibrillation, chronic  On Pradaxa PTA, compliant with medication; stopped in hospital  On metoprolol IV 2.5mg  bid, rate controlled  On Eliquis  Hypotension History of hypertension  BP on the low side  Avoid hypotension due to 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 as well as basal artery stenosis  BP goal 120-160  CHF  CXR showed left basilar atelectasis with vascular congestion  Received lasix  CCM on board  Right femoral A. Pseudoaneurysm  S/p pseudoaneurysm repair with Dr. Arbie CookeyEarly  Continue dressing drainage  Hyperlipidemia  Lipid lowering medication XBM:WUXLPTA:none  LDL81, goal < 70  Current lipid lowering medication:none (statin intolerant - abnormal labs)  Was on zetia   Continue zetia on d/c  Diabetes  HgbA1c7.1, goal < 7.0  Uncontrolled  Hyperglycemia improved  CBG monitoring  On Levemir, continue  Other Stroke Risk Factors  Advanced age  Former cigarette smoker - quit  Morbid Obesity,Body mass index is 47.79 kg/m., recommend weight loss, diet and exercise as appropriate   Hx stroke/TIA  PVD  OSA -  pt refused CPAP in hospital - pt uses nasal pillow at home  Other Active Problems  Urethral stricture   Neurology will sign off. Please call with questions. Pt will follow up with stroke clinic NP at [facility] in about 4 weeks after discharge. Thanks for the consult.   Marvel Plan, MD PhD Stroke Neurology 09/19/2018 4:08 PM

## 2018-09-19 NOTE — Procedures (Signed)
Extubation Procedure Note  Patient Details:   Name: Darren Frazier DOB: [birth year] MRN: [id number]   Airway Documentation:    Vent end date: 09/19/18 Vent end time: 1115   Evaluation  O2 sats: stable throughout Complications: No apparent complications Patient did tolerate procedure well. Bilateral Breath Sounds: Clear, Diminished   Yes, Placed on 4L [place]  SPO2 92%  Toula Moos 09/19/2018, 11:24 AM

## 2018-09-19 NOTE — Progress Notes (Signed)
NAME:  Darren Frazier, MRN:  [id number], DOB:  06-17-1941, LOS: 2 ADMISSION DATE:  09/17/2018, CONSULTATION DATE:  09/17/2018 REFERRING MD:  Dr. Clarene Duke, CHIEF COMPLAINT:  Respiratory failure    Brief History   1 yoM with was discharged today after hospitalization 12/25 -1/15 for acute stroke. Was refused at rehab facility due to hypoxia. Required intubation for airway protection on arrival to ER. Head CT neg. CXR c/w pulmonary edema.   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 No new acute intracranial abnormality. 2. Expected continued evolution of a multifocal bilateral cortical infarcts. 3. Chronic microvascular ischemic white matter disease.  1/15 CXR >> 1. Satisfactory position of the endotracheal and enteric tubes. 2. Persistent edema suggesting congestive heart failure. Increasing retrocardiac pulmonary opacity.  Micro Data:  1/15 BCx2 >>  Antimicrobials:  vanc and cefepime x 1 in ER 1/15  Interim history/subjective:  Overnight:Tachycardic to the 120s  Today, Darren Frazier appeared comfortable. He was awake, alert. His wife was at bedside   Objective   Blood pressure 117/65, pulse 97, temperature 98.1 F (36.7 C), temperature source Oral, resp. rate 15, height 5\' 11"  (1.803 m), weight 125.9 kg, SpO2 97 %.    Vent Mode: PRVC FiO2 (%):  [40 %] 40 % Set Rate:   [15 bmp] 15 bmp Vt Set:  [600 mL] 600 mL PEEP:  [5 cmH20] 5 cmH20 Plateau Pressure:  [14 cmH20-22 cmH20] 21 cmH20   Intake/Output Summary (Last 24 hours) at 09/19/2018 0555 Last data filed at 09/19/2018 0500 Gross per 24 hour  Intake 1288.97 ml  Output 1710 ml  Net -421.03 ml   Filed Weights   09/17/18 2122 09/18/18 0500  Weight: 130.5 kg 125.9 kg    Examination: General: in NAD HENT: ETT in place  Lungs: CTABL Cardiovascular: Irregular,  Abdomen: BS+, NTTP Extremities: No pedal edema Neuro: RLE/RLE strength 0/5, LUE and LLE strength intact  Skin: 4 x 4 irregularly shaped superficial wound located at the right sided lower abdominal skin folds.  Resolved Hospital Problem list     Assessment & Plan:  78 year old gentleman with nonischemic cardiomyopathy EF 60 to 65%, large MCA infarct, type 2 diabetes, hypertension, atrial fibrillation presenting with acute on chronic anemia  #Acute hypoxic respiratory failure  #Acute on chronic diastolic heart failure exacerbation  -s/p IV Lasix 40 mg x 2 doses -Extubate today  -WUA/SBT  -ABG showed chronic hypercarbic respiratory acidosis. Given his recent history of CVA, he will require assistance with ventilation with CPAP or BiPAP  #Acute encephalopathy (DDX metabolic vs toxic vs infectious vs structural) Hx of large left MCA infarction w/ left ICA stenosis with residual right hemiplegia #Dysphagia -CT head on admission unremarkable -Continue neurochecks -Appreciate neurology reccs -EEG with no no evidence of seizure  -If unable to pass swallow, will most likely require CoreTrack  #Paroxysmal atrial fibrillation #Tachybradycardia syndrome -Continue Eliquis -Metoprolol 2.5mg  BID, there is room to increase if he remains uncontrolled  -Hold Amiodarone  for now until he passes swallow eval   #History of cardiomyopathy  #Prolonged QTC on EKG -Most recent echocardiogram in 2019 reviewed and ejection fraction of 60 to 65% though  study was suboptimal.  Echocardiogram in 2018 showed EF 50% with normal systolic function. -Will require TEE to better evaluate structural function -Continue telemetry monitoring  #Hypertension -BP stable   #Acute Kidney Injury -sCr 1.6 (Baseline 1.2) -Keep foley catheter in place  -will attempt to feed today If he passes swallow evaluation, hopefully that should increase renal perfusion.   #Hyperlipidemia - Can resume Zetia  #Type 2 diabetes mellitus - Daily CBG -Sliding scale insulin, sensitive -Levemir 5U BID  #Macrocytic anemia -Hemoglobin stable -Follow-up CBC  #Hypothyroidism -TSH elevated at 6 -Increase po to daily (IV conversion of )  #Urinary tract infection Urinalysis on admission revealed pyuria without bacteria -Started on cefepime and vancomycin in ED, discontinued today. -UCx unremarkable   #Urethral stricture -Keep Foley catheter in place -Follow-up urine output -Follow-up urine culture  Best practice:  Diet:NPO, Tube feed. Attempt enteral feed today.  Pain/Anxiety/Delirium protocol (if indicated):fentanyl/ versed VAP protocol (if indicated):n/a DVT prophylaxis:eliquis GI prophylaxis:pepcid Glucose control:ssi Mobility:BR Code Status:Full. Code status approached and family wishes to continue all aggressive care at this time.  Family Communication:Wife, Darren Frazier [phone]) and son, Darren Frazier [phone]) Disposition:ICU  Labs   CBC: Recent Labs  Lab 09/16/18 0520 09/17/18 0430 09/17/18 1938 09/17/18 1946 09/18/18 0734 09/19/18 0431  WBC 17.3* 14.4* 12.3*  --  12.9* 10.0  NEUTROABS  --   --  9.6*  --   --   --   HGB 12.2* 11.8* 12.1* 13.6 12.2* 10.3*  HCT 39.0 36.9* 40.2 40.0 39.1 33.3*  MCV 102.4* 101.4* 105.8*  --  100.0 100.9*  PLT 429* 436* 358  --  365 313    Basic Metabolic Panel: Recent Labs  Lab 09/16/18 0520 09/17/18 0430 09/17/18 1938 09/17/18 1946 09/18/18 0241 09/18/18 0734 09/19/18 0431    NA 138 138 141 139  --  140 140  K 4.3 4.3 4.8 5.2* 4.8 4.2 3.5  CL 99 100 104 100  --  98 98  CO2 29 29 28   --   --  27 31  GLUCOSE 145* 137* 137* 143*  --  115* 159*  BUN 10 11 12 18   --  17 26*  CREATININE 1.01 1.08 1.14 1.20  --  1.51* 1.68*  CALCIUM 9.1 9.2 8.8*  --   --  9.4 9.0  MG  --   --   --   --  2.0 1.9 2.0  PHOS  --   --   --   --   --  3.0 3.6   GFR: Estimated Creatinine Clearance: 49.7 mL/min (A) (by C-G formula based on SCr of 1.68 mg/dL (H)). Recent Labs  Lab 09/17/18 0430 09/17/18 1938 09/17/18 1947 09/18/18 0241 09/18/18 0734 09/19/18 0431  WBC 14.4* 12.3*  --   --  12.9* 10.0  LATICACIDVEN  --   --  2.18* 2.3*  --   --     Liver Function Tests: Recent Labs  Lab 09/17/18 1938  AST 36  ALT 28  ALKPHOS 81  BILITOT 0.7  PROT 7.1  ALBUMIN 2.7*   Recent Labs  Lab 09/17/18 1938  LIPASE 49   No results for input(s): AMMONIA in the last 168 hours.  ABG    Component Value Date/Time   PHART 7.382 09/17/2018 2042   PCO2ART 60.4 (H) 09/17/2018  2042   PO2ART 201.0 (H) 09/17/2018 2042   HCO3 35.9 (H) 09/17/2018 2042   TCO2 38 (H) 09/17/2018 2042   ACIDBASEDEF 3.0 (H) 09/02/2018 0754   O2SAT 100.0 09/17/2018 2042     Coagulation Profile: Recent Labs  Lab 09/18/18 0241  INR 1.73    Cardiac Enzymes: No results for input(s): CKTOTAL, CKMB, CKMBINDEX, TROPONINI in the last 168 hours.  HbA1C: Hgb A1c MFr Bld  Date/Time Value Ref Range Status  08/28/2018 06:00 AM 7.1 (H) 4.8 - 5.6 % Final    Comment:    (NOTE)         Prediabetes: 5.7 - 6.4         Diabetes: >6.4         Glycemic control for adults with diabetes: <7.0     CBG: Recent Labs  Lab 09/18/18 1126 09/18/18 1514 09/18/18 1944 09/18/18 2345 09/19/18 0400  GLUCAP 149* 164* 202* 181* 147*

## 2018-09-20 ENCOUNTER — Encounter (HOSPITAL_COMMUNITY): Payer: Self-pay | Admitting: Family

## 2018-09-20 LAB — BASIC METABOLIC PANEL
Anion gap: 11 (ref 5–15)
BUN: 23 mg/dL (ref 8–23)
CO2: 33 mmol/L — ABNORMAL HIGH (ref 22–32)
Calcium: 9.3 mg/dL (ref 8.9–10.3)
Chloride: 99 mmol/L (ref 98–111)
Creatinine, Ser: 1.18 mg/dL (ref 0.61–1.24)
GFR calc Af Amer: >60 mL/min (ref >60)
GFR calc non Af Amer: 59 mL/min — ABNORMAL LOW (ref >60)
Glucose, Bld: 125 mg/dL — ABNORMAL HIGH (ref 70–99)
POTASSIUM: 3.7 mmol/L (ref 3.5–5.1)
Sodium: 143 mmol/L (ref 135–145)

## 2018-09-20 LAB — MAGNESIUM: Magnesium: 2.1 mg/dL (ref 1.7–2.4)

## 2018-09-20 LAB — CBC
HEMATOCRIT: 35.5 % — AB (ref 39.0–52.0)
HEMOGLOBIN: 11 g/dL — AB (ref 13.0–17.0)
MCH: 31.9 pg (ref 26.0–34.0)
MCHC: 31 g/dL (ref 30.0–36.0)
MCV: 102.9 fL — ABNORMAL HIGH (ref 80.0–100.0)
Platelets: 338 10*3/uL (ref 150–400)
RBC: 3.45 MIL/uL — ABNORMAL LOW (ref 4.22–5.81)
RDW: 14.4 % (ref 11.5–15.5)
WBC: 9.8 10*3/uL (ref 4.0–10.5)
nRBC: 0 % (ref 0.0–0.2)

## 2018-09-20 LAB — GLUCOSE, CAPILLARY
Glucose-Capillary: 102 mg/dL — ABNORMAL HIGH (ref 70–99)
Glucose-Capillary: 111 mg/dL — ABNORMAL HIGH (ref 70–99)
Glucose-Capillary: 120 mg/dL — ABNORMAL HIGH (ref 70–99)
Glucose-Capillary: 141 mg/dL — ABNORMAL HIGH (ref 70–99)

## 2018-09-20 LAB — PHOSPHORUS: Phosphorus: 3.7 mg/dL (ref 2.5–4.6)

## 2018-09-20 MED ORDER — VITAMIN C 500 MG PO TABS
500.0000 mg | ORAL_TABLET | Freq: Every day | ORAL | Status: DC
  Administered 2018-09-20 – 2018-09-30 (×11): 500 mg via ORAL
  Filled 2018-09-20 (×11): qty 1

## 2018-09-20 MED ORDER — LEVOTHYROXINE SODIUM 88 MCG PO TABS
88.0000 ug | ORAL_TABLET | Freq: Every day | ORAL | Status: DC
  Administered 2018-09-21: 88 ug via ORAL
  Filled 2018-09-20: qty 1

## 2018-09-20 MED ORDER — FLUTICASONE PROPIONATE 50 MCG/ACT NA SUSP
2.0000 | Freq: Every day | NASAL | Status: DC | PRN
  Filled 2018-09-20: qty 16

## 2018-09-20 MED ORDER — EZETIMIBE 10 MG PO TABS
10.0000 mg | ORAL_TABLET | Freq: Every day | ORAL | Status: DC
  Administered 2018-09-21 – 2018-09-30 (×10): 10 mg via ORAL
  Filled 2018-09-20 (×10): qty 1

## 2018-09-20 MED ORDER — GLUCERNA SHAKE PO LIQD
237.0000 mL | Freq: Two times a day (BID) | ORAL | Status: DC
  Administered 2018-09-20 – 2018-09-22 (×4): 237 mL via ORAL

## 2018-09-20 MED ORDER — AMIODARONE HCL 200 MG PO TABS
200.0000 mg | ORAL_TABLET | Freq: Every day | ORAL | Status: DC
  Administered 2018-09-20 – 2018-09-30 (×11): 200 mg via ORAL
  Filled 2018-09-20 (×11): qty 1

## 2018-09-20 MED ORDER — VITAMIN D 25 MCG (1000 UNIT) PO TABS
1000.0000 [IU] | ORAL_TABLET | Freq: Every day | ORAL | Status: DC
  Administered 2018-09-21 – 2018-09-30 (×10): 1000 [IU] via ORAL
  Filled 2018-09-20 (×10): qty 1

## 2018-09-20 MED ORDER — SENNOSIDES-DOCUSATE SODIUM 8.6-50 MG PO TABS: 1.0000 | ORAL_TABLET | Freq: Every evening | ORAL | Status: DC | PRN

## 2018-09-20 MED ORDER — METOPROLOL TARTRATE 25 MG PO TABS
25.0000 mg | ORAL_TABLET | Freq: Two times a day (BID) | ORAL | Status: DC
  Administered 2018-09-20 – 2018-09-21 (×3): 25 mg via ORAL
  Filled 2018-09-20 (×3): qty 1

## 2018-09-20 MED ORDER — QUETIAPINE FUMARATE 50 MG PO TABS
50.0000 mg | ORAL_TABLET | Freq: Every day | ORAL | Status: DC
  Administered 2018-09-20: 50 mg via ORAL
  Filled 2018-09-20: qty 1

## 2018-09-20 NOTE — Evaluation (Signed)
Clinical/Bedside Swallow Evaluation Patient Details  Name: Darren Frazier MRN: [id number] Date of Birth: August 01, 1941  Today's Date: 09/20/2018 Time: SLP Start Time (ACUTE ONLY): 1002 SLP Stop Time (ACUTE ONLY): 1017 SLP Time Calculation (min) (ACUTE ONLY): 15 min  Past Medical History:  Past Medical History:  Diagnosis Date  . Cardiomyopathy (HCC)   . Diabetes mellitus without complication (HCC)   . Dysrhythmia    atrial fibrillation  . GERD (gastroesophageal reflux disease)   . H/O: urethral stricture    self caths occasionally  . History of abdominal aortic aneurysm (AAA)    30/4mm in diameter, stable  . History of kidney stones 2017  . Hyperlipidemia   . Hypertension   . Hypothyroidism   . Morbid obesity with BMI of 40.0-44.9, adult (HCC) 2019  . Peripheral vascular disease (HCC) 2019   lymphadema both extremities, ulceration on left big toe  . Stroke (HCC) 2011   x 2 within 6 months. some numbness over upper arms, balance off  . Tremors of nervous system   . Vitamin D deficiency    Past Surgical History:  Past Surgical History:  Procedure Laterality Date  . APPENDECTOMY  1961  . CATARACT EXTRACTION, BILATERAL Bilateral   . CENTRAL LINE  08/29/2018      . COLONOSCOPY    . IR ANGIO INTRA EXTRACRAN SEL COM CAROTID INNOMINATE UNI R MOD SED  08/27/2018  . IR ANGIO VERTEBRAL SEL SUBCLAVIAN INNOMINATE UNI L MOD SED  08/27/2018  . IR CT HEAD LTD  08/27/2018  . IR PERCUTANEOUS ART THROMBECTOMY/INFUSION INTRACRANIAL INC DIAG ANGIO  08/27/2018  . LOWER EXTREMITY ANGIOGRAPHY Left 03/05/2018   Procedure: LOWER EXTREMITY ANGIOGRAPHY;  Surgeon: Renford Dills, MD;  Location: [facility] INVASIVE CV LAB;  Service: Cardiovascular;  Laterality: Left;  . RADIOLOGY WITH ANESTHESIA N/A 08/27/2018   Procedure: IR WITH ANESTHESIA;  Surgeon: Radiologist, Medication, MD;  Location: [facility] OR;  Service: Radiology;  Laterality: N/A;  . TONSILLECTOMY     HPI:  Pt is a 78 yo male recently  admitted 08/27/18-09/17/18 with left MCA/ACA CVA, multiple scattered chronic underlying cortical infarcts involving the bilateral cerebral and right cerebellar hemispheres. Seen by SLP during admission, advanced to regular/thin liquid diet. D/c to rehab but was not accepted due to hypoxemia, required intubation on return to ED on 09/17/18. Extubated 09/19/18.    Assessment / Plan / Recommendation Clinical Impression   Patient presents with moderate risk for aspiration given brief intubation but primarily due to confusion/distractibility. Pt oriented to self only; states current location as "[place]." Follows basic, single step commands 75% accuracy. Airway protection appears adequate for thin liquids, including consecutive straw sips. With purees, pt has prolonged oral transit and poor bolus awareness due to attention deficits, unable to follow cues to refrain from talking, initiate swallow. He ultimately does so volitionally; delayed cough x1, productive of yellow secretions. Recommend initiating full liquids, medications crushed in applesauce. Will follow up for tolerance, advancement with improvements in mentation. D/w wife, Charity fundraiser.   SLP Visit Diagnosis: Dysphagia, unspecified (R13.10)    Aspiration Risk  Moderate aspiration risk    Diet Recommendation Thin liquid   Liquid Administration via: Cup;Straw Medication Administration: Crushed with puree Supervision: Full supervision/cueing for compensatory strategies Compensations: Slow rate;Small sips/bites;Minimize environmental distractions    Other  Recommendations Oral Care Recommendations: Oral care BID   Follow up Recommendations 24 hour supervision/assistance;Skilled Nursing facility      Frequency and Duration min 2x/week  1 week  Prognosis Prognosis for Safe Diet Advancement: Good Barriers to Reach Goals: Cognitive deficits      Swallow Study   General Date of Onset: 09/17/18 HPI: Pt is a 78 yo male recently admitted  08/27/18-09/17/18 with left MCA/ACA CVA, multiple scattered chronic underlying cortical infarcts involving the bilateral cerebral and right cerebellar hemispheres. Seen by SLP during admission, advanced to regular/thin liquid diet. D/c to rehab but was not accepted due to hypoxemia, required intubation on return to ED on 09/17/18. Extubated 09/19/18.  Type of Study: Bedside Swallow Evaluation Previous Swallow Assessment: see HPI Diet Prior to this Study: NPO Temperature Spikes Noted: No Respiratory Status: Nasal cannula History of Recent Intubation: Yes Length of Intubations (days): 2 days Date extubated: 09/19/18 Behavior/Cognition: Alert;Confused Oral Cavity Assessment: Within Functional Limits Oral Care Completed by SLP: Recent completion by staff Oral Cavity - Dentition: Adequate natural dentition Self-Feeding Abilities: Needs assist Patient Positioning: Upright in bed Baseline Vocal Quality: Low vocal intensity Volitional Cough: Strong Volitional Swallow: Able to elicit    Oral/Motor/Sensory Function Overall Oral Motor/Sensory Function: Within functional limits   Ice Chips Ice chips: Within functional limits Presentation: Spoon   Thin Liquid Thin Liquid: Within functional limits Presentation: Cup;Straw;Spoon    Nectar Thick Nectar Thick Liquid: Not tested   Honey Thick Honey Thick Liquid: Not tested   Puree Puree: Impaired Presentation: Spoon;Self Fed Oral Phase Impairments: Poor awareness of bolus Oral Phase Functional Implications: Prolonged oral transit;Other (comment)(distractible, talking with mouth full despite cues)   Solid    Rondel Baton, MS, CCC-SLP Speech-Language Pathologist Acute Rehabilitation Services Pager: [phone] Office: [phone]  Solid: Not tested      Arlana Lindau 09/20/2018,10:30 AM

## 2018-09-20 NOTE — Progress Notes (Signed)
NAME:  Darren Frazier, MRN:  [id number], DOB:  [birth year], LOS: 3 ADMISSION DATE:  09/17/2018, CONSULTATION DATE:  09/17/2018 REFERRING MD:  Dr. Clarene Duke, CHIEF COMPLAINT:  Respiratory failure    Brief History   22 yoM with was discharged today after hospitalization 12/25 -1/15 for acute stroke. Was refused at rehab facility due to hypoxia. Required intubation for airway protection on arrival to ER. Head CT neg. CXR c/w pulmonary edema.   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   Consults:  Neurology  Procedures:  1/15 ETT >> 1/15 Foley in ER >> 1/15 OGT >>  Significant Diagnostic Tests:  1/15 CT head >> 1. No new acute intracranial abnormality. 2. Expected continued evolution of a multifocal bilateral cortical infarcts. 3. Chronic microvascular ischemic white matter disease.  1/15 CXR >> 1. Satisfactory position of the endotracheal and enteric tubes. 2. Persistent edema suggesting congestive heart failure. Increasing retrocardiac pulmonary opacity.  Micro Data:  1/15 BCx2 >>  Antimicrobials:  vanc and cefepime x 1 in ER 1/15  Interim history/subjective:  Confused overnight, but no respiratory distress.   Objective   Blood pressure (!) 143/80, pulse (!) 110, temperature 97.8 F (36.6 C), temperature source Axillary, resp. rate 19, height 5\' 11"  (1.803 m), weight 123.4 kg, SpO2 97 %.    Vent Mode: PRVC;PSV FiO2 (%):  [40 %] 40 % PEEP:  [5 cmH20] 5  cmH20 Pressure Support:  [8 cmH20] 8 cmH20   Intake/Output Summary (Last 24 hours) at 09/20/2018 0907 Last data filed at 09/20/2018 0500 Gross per 24 hour  Intake 249.28 ml  Output 525 ml  Net -275.72 ml   Filed Weights   09/18/18 0500 09/19/18 0600 09/20/18 0413  Weight: 125.9 kg 122.4 kg 123.4 kg    Examination: General: in NAD HENT: dry mucous membranes Lungs: CTABL Cardiovascular: Irregular, no JVD, HS normal Abdomen: BS+, NTTP Extremities: No pedal edema Neuro: RLE/RLE strength 0/5, LUE and LLE strength intact, calm but mild confusion. Skin: 4 x 4 irregularly shaped superficial wound located at the right sided lower abdominal skin folds.  Resolved Hospital Problem list   Respiratory failure.  Assessment & Plan:  78 year old gentleman with nonischemic cardiomyopathy EF 60 to 65%, large MCA infarct, type 2 diabetes, hypertension, atrial fibrillation presenting with acute on chronic anemia  Was critically ill due to respiratory failure from decompensated HFpEF. Now extubated. Acute encephalopathy (DDX metabolic vs toxic vs infectious vs structural) Hx of large left MCA infarction w/ left ICA stenosis with residual right hemiplegia Dysphagia PAF with tachy-brady syndrome HTN Dyslipidemia T2DM Microcytic anemia. AKI Urinary stricture   PLAN:  Continue to wean O2. CPAP for OSA PT/OT/SLP Transition to oral diet Maintenance oral diuretics Resume home amiodarone, beta-blocker and apixaban.   Best practice:  Diet:swallow screen and advance oral diet Pain/Anxiety/Delirium protocol (if indicated):no sedation VAP protocol (if indicated):n/a DVT prophylaxis:eliquis GI prophylaxis:pepcid Glucose control:ssi Mobility:BR Code Status:Full. Code status approached and family wishes to continue all aggressive care at this time.  Family Communication:Wife, Lupita Leash [phone]) and son, Barbara Cower [phone]) Disposition:transfer to step down today under [facility].  Order reconciliation performed.  Labs   CBC: Recent Labs  Lab 09/17/18 0430 09/17/18 1938 09/17/18 1946 09/18/18 0734 09/19/18 0431 09/20/18 0402  WBC 14.4* 12.3*  --  12.9* 10.0 9.8  NEUTROABS  --  9.6*  --   --   --   --   HGB 11.8* 12.1* 13.6 12.2* 10.3* 11.0*  HCT 36.9* 40.2 40.0 39.1 33.3* 35.5*  MCV 101.4* 105.8*  --  100.0 100.9* 102.9*  PLT 436* 358  --  365 313 338    Basic Metabolic Panel: Recent Labs  Lab 09/17/18 0430 09/17/18 1938 09/17/18 1946 09/18/18 0241 09/18/18 0734 09/19/18 0431 09/20/18 0402  NA 138 141 139  --  140 140 143  K 4.3 4.8 5.2* 4.8 4.2 3.5 3.7  CL 100 104 100  --  98 98 99  CO2 29 28  --   --  27 31 33*  GLUCOSE 137* 137* 143*  --  115* 159* 125*  BUN 11 12 18   --  17 26* 23  CREATININE 1.08 1.14 1.20  --  1.51* 1.68* 1.18  CALCIUM 9.2 8.8*  --   --  9.4 9.0 9.3  MG  --   --   --  2.0 1.9 2.0 2.1  PHOS  --   --   --   --  3.0 3.6 3.7   GFR: Estimated Creatinine Clearance: 70.1 mL/min (by C-G formula based on SCr of 1.18 mg/dL). Recent Labs  Lab 09/17/18 1938 09/17/18 1947 09/18/18 0241 09/18/18 0734 09/19/18 0431 09/20/18 0402  WBC 12.3*  --   --  12.9* 10.0 9.8  LATICACIDVEN  --  2.18* 2.3*  --   --   --     Liver Function Tests: Recent Labs  Lab 09/17/18 1938  AST 36  ALT 28  ALKPHOS 81  BILITOT 0.7  PROT 7.1  ALBUMIN 2.7*   Recent Labs  Lab 09/17/18 1938  LIPASE 49   No results for input(s): AMMONIA in the last 168 hours.  ABG    Component Value Date/Time   PHART 7.382 09/17/2018 2042   PCO2ART 60.4 (H) 09/17/2018 2042   PO2ART 201.0 (H) 09/17/2018 2042   HCO3 35.9 (H) 09/17/2018 2042   TCO2 38 (H) 09/17/2018 2042   ACIDBASEDEF 3.0 (H) 09/02/2018 0754   O2SAT 100.0 09/17/2018 2042     Coagulation Profile: Recent Labs  Lab 09/18/18 0241  INR 1.73    Cardiac Enzymes: No results for input(s): CKTOTAL, CKMB, CKMBINDEX, TROPONINI in the last 168 hours.  HbA1C: Hgb A1c MFr Bld  Date/Time  Value Ref Range Status  08/28/2018 06:00 AM 7.1 (H) 4.8 - 5.6 % Final    Comment:    (NOTE)         Prediabetes: 5.7 - 6.4         Diabetes: >6.4         Glycemic control for adults with diabetes: <7.0     CBG: Recent Labs  Lab 09/19/18 1524 09/19/18 1951 09/19/18 2325 09/20/18 0405 09/20/18 0733  GLUCAP 104* 108* 108* 102* 111*   35 min including >50% in counseling and coordination of care.  Lynnell Catalanavi Yida Hyams, MD [facility] ICU Physician [facility]  Pager: [phone] Mobile: [phone] After hours: [phone].  09/20/2018, 9:20 AM

## 2018-09-20 NOTE — Progress Notes (Signed)
Pt received from ICU. VSS. Telemetry applied, CCMD notified x2. Wife at bedside. Will continue to monitor.   Leonidas Romberg, RN

## 2018-09-20 NOTE — Evaluation (Addendum)
Occupational Therapy Evaluation Patient Details Name: Darren Frazier MRN: [id number] DOB: 11/22/1940 Today's Date: 09/20/2018    History of Present Illness Pt is a 78 y/o male who was discharged on 09/17/18 after hospitalization 12/25 -1/15 for acute stroke. Pt was refused at rehab facility due to hypoxia. He required intubation for airway protection on arrival to ER (intubated 1/15-1/17).  Head CT neg.  CXR c/w pulmonary edema. PMH including but not limited to recent L MCA/ACA stroke, DM, HTN, PVD, tremors.   Clinical Impression   Upon arrival, pt awake and supine in bed with HOB elevated and wife at bedside. Pt recent dc to rehab and was independent prior to stroke. Pt currently requiring Max-Total A for ADLs and Max-Total +2 for bed mobility. Pt presenting with visual deficits, right sided weakness, poor cognition, and decreased balance. Pt would benefit from further acute OT to facilitate safe dc. Recommend dc to SNF for further OT to optimize safety, independence with ADLs, and return to PLOF.      Follow Up Recommendations  SNF;Supervision/Assistance - 24 hour    Equipment Recommendations  Other (comment)(Defer to next venue)    Recommendations for Other Services PT consult;Speech consult     Precautions / Restrictions Precautions Precautions: Fall Other Brace: PRAFO boots bil Restrictions Weight Bearing Restrictions: No      Mobility Bed Mobility Overal bed mobility: Needs Assistance Bed Mobility: Supine to Sit;Sit to Supine     Supine to sit: +2 for physical assistance;Max assist Sit to supine: +2 for physical assistance;Total assist   General bed mobility comments: pt required increased time, multimodal cueing to maintain attention to task; pt able to move L LE towards EOB; heavy physical assistance for trunk elevation; total A x2 to return to supine  Transfers                 General transfer comment: Defered for safety    Balance Overall balance  assessment: Needs assistance Sitting-balance support: Single extremity supported Sitting balance-Leahy Scale: Poor Sitting balance - Comments: progressing from total A to min A with one UE support Postural control: Posterior lean                                 ADL either performed or assessed with clinical judgement   ADL Overall ADL's : Needs assistance/impaired Eating/Feeding: Moderate assistance;Bed level   Grooming: Maximal assistance;Sitting;Wash/dry face Grooming Details (indicate cue type and reason): Pt able to grasp wash cloth and wash his face with Max support for sitting at EOB. Upper Body Bathing: Maximal assistance;Bed level   Lower Body Bathing: Maximal assistance;Bed level   Upper Body Dressing : Maximal assistance;Sitting   Lower Body Dressing: Total assistance;Bed level                 General ADL Comments: Pt with poor cognition, strength, balance, and functional use of RUE.      Vision Baseline Vision/History: Wears glasses Wears Glasses: Reading only Vision Assessment?: Vision impaired- to be further tested in functional context Eye Alignment: Within Functional Limits Alignment/Gaze Preference: Other (comment)(inattention to right) Tracking/Visual Pursuits: Requires cues, head turns, or add eye shifts to track;Impaired - to be further tested in functional context Additional Comments: Pt able to track to right. However, requiring cues for attending to right and prefered to gaze at midline or left visual field.      Perception     Praxis  Pertinent Vitals/Pain Pain Assessment: No/denies pain     Hand Dominance Right   Extremity/Trunk Assessment Upper Extremity Assessment Upper Extremity Assessment: RUE deficits/detail;LUE deficits/detail RUE Deficits / Details: Decreased grasp strength and coorindation. Pt with trace muscle movements proximally. Edema noted. RUE Sensation: decreased light touch;decreased proprioception RUE  Coordination: decreased fine motor;decreased gross motor LUE Deficits / Details: Able to grasp wash cloth and bring to his face. LUE Sensation: decreased proprioception LUE Coordination: decreased gross motor;decreased fine motor   Lower Extremity Assessment Lower Extremity Assessment: Defer to PT evaluation RLE Deficits / Details: no active movement of R LE throughout; pt reporting sensation diminished to light touch throughout   Cervical / Trunk Assessment Cervical / Trunk Assessment: Other exceptions Cervical / Trunk Exceptions: Increased body habitus and trunk control   Communication Communication Communication: Expressive difficulties   Cognition Arousal/Alertness: Awake/alert Behavior During Therapy: Restless Overall Cognitive Status: Impaired/Different from baseline Area of Impairment: Orientation;Attention;Memory;Following commands;Safety/judgement;Awareness;Problem solving                 Orientation Level: Disoriented to;Place;Time;Situation Current Attention Level: Focused Memory: Decreased recall of precautions;Decreased short-term memory Following Commands: Follows one step commands inconsistently;Follows one step commands with increased time Safety/Judgement: Decreased awareness of deficits;Decreased awareness of safety Awareness: Intellectual Problem Solving: Slow processing;Decreased initiation;Difficulty sequencing;Requires verbal cues;Requires tactile cues General Comments: Pt stating he is in [place]. Pt requiring Max cues.    General Comments  SpO2 dropping to 86% on RA. Upon returning to supine, SpO2 dropping to 78% and required cues for purse lip breathing. Wife present throughout session    Exercises     Shoulder Instructions      Home Living Family/patient expects to be discharged to:: Skilled nursing facility                                 Additional Comments: pt was d/c'd to SNF [facility]); however, upon arrival pt found to  be hypoxic and was sent back to [facility] ED      Prior Functioning/Environment Level of Independence: Independent        Comments: prior to recent CVA        OT Problem List: Decreased strength;Decreased range of motion;Decreased activity tolerance;Impaired balance (sitting and/or standing);Impaired vision/perception;Decreased coordination;Decreased cognition;Decreased safety awareness;Decreased knowledge of use of DME or AE;Impaired sensation;Obesity;Impaired UE functional use;Increased edema      OT Treatment/Interventions: Self-care/ADL training;Therapeutic exercise;Neuromuscular education;DME and/or AE instruction;Manual therapy;Therapeutic activities;Visual/perceptual remediation/compensation;Cognitive remediation/compensation;Patient/family education;Balance training    OT Goals(Current goals can be found in the care plan section) Acute Rehab OT Goals Patient Stated Goal: "Go to rehab and get better" Wife OT Goal Formulation: With family Time For Goal Achievement: 10/04/18 Potential to Achieve Goals: Good  OT Frequency: Min 2X/week   Barriers to D/C:            Co-evaluation PT/OT/SLP Co-Evaluation/Treatment: Yes Reason for Co-Treatment: For patient/therapist safety;To address functional/ADL transfers PT goals addressed during session: Mobility/safety with mobility;Balance;Strengthening/ROM OT goals addressed during session: ADL's and self-care      AM-PAC OT "6 Clicks" Daily Activity     Outcome Measure Help from another person eating meals?: A Lot Help from another person taking care of personal grooming?: A Lot Help from another person toileting, which includes using toliet, bedpan, or urinal?: Total Help from another person bathing (including washing, rinsing, drying)?: A Lot Help from another person to put on and taking off regular upper body clothing?:  A Lot Help from another person to put on and taking off regular lower body clothing?: Total 6 Click Score: 10    End of Session Equipment Utilized During Treatment: Gait belt;Oxygen Nurse Communication: Mobility status;Precautions  Activity Tolerance: Patient tolerated treatment well Patient left: in bed;with call bell/phone within reach;with bed alarm set;with family/visitor present  OT Visit Diagnosis: Unsteadiness on feet (R26.81);Other abnormalities of gait and mobility (R26.89);Muscle weakness (generalized) (M62.81);Other symptoms and signs involving the nervous system (R29.898);Other symptoms and signs involving cognitive function;Hemiplegia and hemiparesis Hemiplegia - Right/Left: Right Hemiplegia - dominant/non-dominant: Dominant Hemiplegia - caused by: Cerebral infarction                Time: [id number] OT Time Calculation (min): 19 min Charges:  OT General Charges $OT Visit: 1 Visit OT Evaluation $OT Eval Moderate Complexity: 1 Mod  Joanie Duprey MSOT, OTR/L Acute Rehab Pager: [phone] Office: [phone]  Theodoro GristCharis M Johann Gascoigne 09/20/2018, 12:42 PM

## 2018-09-20 NOTE — Evaluation (Signed)
Physical Therapy Evaluation Patient Details Name: Darren Frazier MRN: [id number] DOB: 16-Jan-1941 Today's Date: 09/20/2018   History of Present Illness  Pt is a 78 y/o male who was discharged on 09/17/18 after hospitalization 12/25 -1/15 for acute stroke. Pt was refused at rehab facility due to hypoxia. He required intubation for airway protection on arrival to ER (intubated 1/15-1/17).  Head CT neg.  CXR c/w pulmonary edema. PMH including but not limited to recent L MCA/ACA stroke, DM, HTN, PVD, tremors.    Clinical Impression  Pt presented supine in bed with HOB elevated, awake and willing to participate in therapy session. Pt's spouse present throughout session as well. Pt currently requires max-total A x2 for bed mobility and min-total A to maintain upright sitting EOB. Pt on RA throughout with SPO2 decreasing to low 80's with activity with quick recovery to >90% in supine with 1L of supplemental O2 donned. Pt's RN was notified. All other VSS throughout. Pt would continue to benefit from skilled physical therapy services at this time while admitted and after d/c to address the below listed limitations in order to improve overall safety and independence with functional mobility.     Follow Up Recommendations SNF    Equipment Recommendations  None recommended by PT    Recommendations for Other Services       Precautions / Restrictions Precautions Precautions: Fall Other Brace: PRAFO boots bil Restrictions Weight Bearing Restrictions: No      Mobility  Bed Mobility Overal bed mobility: Needs Assistance Bed Mobility: Supine to Sit;Sit to Supine     Supine to sit: +2 for physical assistance;Max assist Sit to supine: +2 for physical assistance;Total assist   General bed mobility comments: pt required increased time, multimodal cueing to maintain attention to task; pt able to move L LE towards EOB; heavy physical assistance for trunk elevation; total A x2 to return to  supine  Transfers                    Ambulation/Gait                Stairs            Wheelchair Mobility    Modified Rankin (Stroke Patients Only) Modified Rankin (Stroke Patients Only) Pre-Morbid Rankin Score: No significant disability Modified Rankin: Severe disability     Balance Overall balance assessment: Needs assistance Sitting-balance support: Single extremity supported Sitting balance-Leahy Scale: Poor Sitting balance - Comments: progressing from total A to min A with one UE support Postural control: Posterior lean                                   Pertinent Vitals/Pain Pain Assessment: No/denies pain    Home Living Family/patient expects to be discharged to:: Skilled nursing facility                 Additional Comments: pt was d/c'd to SNF [facility]); however, upon arrival pt found to be hypoxic and was sent back to [facility] ED    Prior Function Level of Independence: Independent         Comments: prior to recent CVA     Hand Dominance        Extremity/Trunk Assessment   Upper Extremity Assessment Upper Extremity Assessment: Defer to OT evaluation    Lower Extremity Assessment Lower Extremity Assessment: RLE deficits/detail RLE Deficits / Details: no active movement of R  LE throughout; pt reporting sensation diminished to light touch throughout       Communication   Communication: Expressive difficulties  Cognition Arousal/Alertness: Awake/alert Behavior During Therapy: Restless Overall Cognitive Status: Impaired/Different from baseline Area of Impairment: Orientation;Attention;Memory;Following commands;Safety/judgement;Awareness;Problem solving                 Orientation Level: Disoriented to;Place;Time;Situation Current Attention Level: Sustained Memory: Decreased recall of precautions;Decreased short-term memory Following Commands: Follows one step commands inconsistently;Follows one step  commands with increased time Safety/Judgement: Decreased awareness of deficits;Decreased awareness of safety Awareness: Intellectual Problem Solving: Slow processing;Decreased initiation;Difficulty sequencing;Requires verbal cues;Requires tactile cues        General Comments      Exercises     Assessment/Plan    PT Assessment Patient needs continued PT services  PT Problem List Decreased strength;Decreased range of motion;Decreased activity tolerance;Decreased balance;Decreased mobility;Decreased coordination;Decreased cognition;Decreased knowledge of use of DME;Decreased safety awareness;Decreased knowledge of precautions       PT Treatment Interventions DME instruction;Gait training;Stair training;Functional mobility training;Therapeutic activities;Therapeutic exercise;Balance training;Neuromuscular re-education;Cognitive remediation;Patient/family education    PT Goals (Current goals can be found in the Care Plan section)  Acute Rehab PT Goals Patient Stated Goal: none stated PT Goal Formulation: With patient/family Time For Goal Achievement: 10/04/18 Potential to Achieve Goals: Fair    Frequency Min 3X/week   Barriers to discharge        Co-evaluation PT/OT/SLP Co-Evaluation/Treatment: Yes Reason for Co-Treatment: For patient/therapist safety;To address functional/ADL transfers PT goals addressed during session: Mobility/safety with mobility;Balance;Strengthening/ROM         AM-PAC PT "6 Clicks" Mobility  Outcome Measure Help needed turning from your back to your side while in a flat bed without using bedrails?: Total Help needed moving from lying on your back to sitting on the side of a flat bed without using bedrails?: Total Help needed moving to and from a bed to a chair (including a wheelchair)?: Total Help needed standing up from a chair using your arms (e.g., wheelchair or bedside chair)?: Total Help needed to walk in hospital room?: Total Help needed  climbing 3-5 steps with a railing? 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

## 2018-09-21 MED ORDER — METOPROLOL TARTRATE 50 MG PO TABS
50.0000 mg | ORAL_TABLET | Freq: Two times a day (BID) | ORAL | Status: DC
  Administered 2018-09-21 – 2018-09-23 (×5): 50 mg via ORAL
  Filled 2018-09-21 (×5): qty 1

## 2018-09-21 MED ORDER — LEVOTHYROXINE SODIUM 100 MCG PO TABS
100.0000 ug | ORAL_TABLET | Freq: Every day | ORAL | Status: DC
  Administered 2018-09-22 – 2018-09-30 (×9): 100 ug via ORAL
  Filled 2018-09-21 (×9): qty 1

## 2018-09-21 MED ORDER — QUETIAPINE FUMARATE 50 MG PO TABS
75.0000 mg | ORAL_TABLET | Freq: Every day | ORAL | Status: DC
  Administered 2018-09-21 – 2018-09-29 (×9): 75 mg via ORAL
  Filled 2018-09-21 (×9): qty 2

## 2018-09-21 NOTE — Progress Notes (Signed)
[facility] TEAM 1 - Stepdown/ICU TEAM  Darren Frazier  ONG:[id number] DOB: 1940/12/30 DOA: 09/17/2018 PCP: Lynnea Ferrier, MD    Brief Narrative:  78 yo M who was discharged to a rehab facility after a hospitalization 12/25 -1/15 for acute stroke. Upon arrival a the facility, he was refused admission due to hypoxia, and required intubation for airway protection on arrival back to the ER. Head CT neg. CXR c/w pulmonary edema.  Significant Events: 12/25 - 1/15 hospitalized for CVA 1/15 - readmitfor acute respiratory failure - intubated  1/17 - extubated 1/19 - [facility] assumed care   Subjective: The patient is alert and conversant but confused.  His wife is at bedside and tells me that this is his baseline since the time of his stroke but that he was indeed fully functional prior to his stroke.  There is no evidence of respiratory distress.  He denies chest pain nausea vomiting or abdominal pain.  Assessment & Plan:  Acute hypoxic respiratory failure Successfully extubated 1/17 -sats 90% or greater on room air -cause of flash pulmonary edema unclear -continue to monitor to assure stable  Acute on chronic diastolic CHF with pulmonary edema EF 60-65% via TTE Dec 2019 -appears stable at this time -follow I's and O's and daily weights Filed Weights   09/18/18 0500 09/19/18 0600 09/20/18 0413  Weight: 125.9 kg 122.4 kg 123.4 kg    Acute encephalopathy CT head unremarkable -EEG without evidence of seizure -appears he has returned to his recent baseline per his wife -attempt to utilize nightly Seroquel  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 -no new recommendations per Neurology -follow-up in stroke clinic in 4 weeks to be arranged by neurology  Dysphagia Thin liquid diet suggested by SLP  Paroxysmal atrial fibrillation with tachy/brady syndrome Continue Eliquis and beta-blocker -rate controlled  DM 2 CBG reasonably  controlled  HLD Continue Zetia  HTN BP not at goal - adjust tx and follow   Acute kidney injury Baseline creatinine 1.2 -creatinine stable at this time  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 rate (!) 26, height 5\' 11"  (1.803 m), weight 123.4 kg, SpO2 98 %.  Intake/Output Summary (Last 24 hours) at 09/21/2018 1119 Last data filed at 09/21/2018 0900 Gross per 24 hour  Intake 536.59 ml  Output 750 ml  Net -213.41 ml   Filed Weights   09/18/18 0500 09/19/18 0600 09/20/18 0413  Weight: 125.9 kg 122.4 kg 123.4 kg    Examination: General: No acute respiratory distress Lungs: Clear to auscultation bilaterally without wheezes or crackles Cardiovascular: Irregular but rate controlled without appreciable murmur Abdomen: Nontender, nondistended, soft, bowel sounds positive, no rebound, no ascites, no appreciable mass Extremities: No significant cyanosis, clubbing, or edema bilateral lower extremities  CBC: Recent Labs  Lab 09/17/18 1938  09/18/18 0734 09/19/18 0431 09/20/18 0402  WBC 12.3*  --  12.9* 10.0 9.8  NEUTROABS 9.6*  --   --   --   --  HGB 12.1*   < > 12.2* 10.3* 11.0*  HCT 40.2   < > 39.1 33.3* 35.5*  MCV 105.8*  --  100.0 100.9* 102.9*  PLT 358  --  365 313 338   < > = values in this interval not displayed.   Basic Metabolic Panel: Recent Labs  Lab 09/18/18 0734 09/19/18 0431 09/20/18 0402  NA 140  140 143  K 4.2 3.5 3.7  CL 98 98 99  CO2 27 31 33*  GLUCOSE 115* 159* 125*  BUN 17 26* 23  CREATININE 1.51* 1.68* 1.18  CALCIUM 9.4 9.0 9.3  MG 1.9 2.0 2.1  PHOS 3.0 3.6 3.7   GFR: Estimated Creatinine Clearance: 70.1 mL/min (by C-G formula based on SCr of 1.18 mg/dL).  Liver Function Tests: Recent Labs  Lab 09/17/18 1938  AST 36  ALT 28  ALKPHOS 81  BILITOT 0.7  PROT 7.1  ALBUMIN 2.7*   Recent Labs  Lab 09/17/18 1938  LIPASE 49   No results for input(s): AMMONIA in the last 168 hours.  Coagulation Profile: Recent Labs  Lab 09/18/18 0241  INR 1.73    Cardiac Enzymes: No results for input(s): CKTOTAL, CKMB, CKMBINDEX, TROPONINI in the last 168 hours.  HbA1C: Hgb A1c MFr Bld  Date/Time Value Ref Range Status  08/28/2018 06:00 AM 7.1 (H) 4.8 - 5.6 % Final    Comment:    (NOTE)         Prediabetes: 5.7 - 6.4         Diabetes: >6.4         Glycemic control for adults with diabetes: <7.0     CBG: Recent Labs  Lab 09/19/18 2325 09/20/18 0405 09/20/18 0733 09/20/18 1112 09/20/18 1641  GLUCAP 108* 102* 111* 120* 141*    Recent Results (from the past 240 hour(s))  Urine Culture     Status: None   Collection Time: 09/15/18  4:25 PM  Result Value Ref Range Status   Specimen Description URINE, CLEAN CATCH  Final   Special Requests   Final    NONE Performed at [facility], [address], [place], [place] [address]    Culture   Final    Multiple bacterial morphotypes present, none predominant. Suggest appropriate recollection if clinically indicated.   Report Status 09/16/2018 FINAL  Final  Urine culture     Status: Abnormal   Collection Time: 09/17/18  8:19 PM  Result Value Ref Range Status   Specimen Description URINE, CATHETERIZED  Final   Special Requests NONE  Final   Culture (A)  Final    <10,000 COLONIES/mL INSIGNIFICANT GROWTH Performed at [facility], [address] [place], [place] [address]    Report Status 09/19/2018  FINAL  Final  Culture, blood (routine x 2)     Status: None (Preliminary result)   Collection Time: 09/17/18  8:36 PM  Result Value Ref Range Status   Specimen Description BLOOD BLOOD LEFT FOREARM  Final   Special Requests   Final    BOTTLES DRAWN AEROBIC AND ANAEROBIC Blood Culture results may not be optimal due to an inadequate volume of blood received in culture bottles   Culture   Final    NO GROWTH 3 DAYS Performed at [facility], [address] [place], [place] [address]    Report Status PENDING  Incomplete  Culture, blood (routine x 2)     Status: None (Preliminary result)   Collection Time: 09/18/18  2:39 AM  Result Value Ref Range Status   Specimen Description BLOOD LEFT FOOT  Final   Special Requests   Final    BOTTLES DRAWN AEROBIC ONLY Blood Culture results may not be optimal due to an inadequate volume of blood received in culture bottles   Culture   Final    NO GROWTH 2 DAYS Performed at [facility], [address] [place], [place] [address]    Report Status PENDING  Incomplete     Scheduled Meds: . amiodarone  200 mg Oral Daily  . apixaban  5 mg Oral BID  . Chlorhexidine Gluconate Cloth  6 each Topical Daily  . cholecalciferol  1,000 Units Oral Daily  . ezetimibe  10 mg Oral Daily  . feeding supplement (GLUCERNA SHAKE)  237 mL Oral BID BM  . levothyroxine  88 mcg Oral QAC breakfast  . metoprolol tartrate  25 mg Oral BID  . QUEtiapine  50 mg Oral QHS  . sodium chloride flush  10-40 mL Intracatheter Q12H  . vitamin C  500 mg Oral Daily     LOS: 4 days   Lonia BloodJeffrey T. , MD [facility] Office  [phone] Pager - Text Page per Amion  If 7PM-7AM, please contact night-coverage per Amion 09/21/2018, 11:19 AM

## 2018-09-21 NOTE — Plan of Care (Signed)
  Problem: Activity: Goal: Ability to tolerate increased activity will improve Outcome: Progressing   Problem: Respiratory: Goal: Ability to maintain a clear airway and adequate ventilation will improve Outcome: Progressing   Problem: Clinical Measurements: Goal: Ability to maintain clinical measurements within normal limits will improve Outcome: Progressing Goal: Will remain free from infection Outcome: Progressing Goal: Diagnostic test results will improve Outcome: Progressing Goal: Cardiovascular complication will be avoided Outcome: Progressing   Problem: Coping: Goal: Level of anxiety will decrease Outcome: Progressing   Problem: Elimination: Goal: Will not experience complications related to bowel motility Outcome: Progressing Goal: Will not experience complications related to urinary retention Outcome: Progressing   Problem: Pain Managment: Goal: General experience of comfort will improve Outcome: Progressing   Problem: Safety: Goal: Ability to remain free from injury will improve Outcome: Progressing   Problem: Skin Integrity: Goal: Risk for impaired skin integrity will decrease Outcome: Progressing   Problem: Role Relationship: Goal: Method of communication will improve Outcome: Not Progressing   Problem: Education: Goal: Knowledge of General Education information will improve Description Including pain rating scale, medication(s)/side effects and non-pharmacologic comfort measures Outcome: Not Progressing Note:  Remains confused and unable to answer orientation questions which is baseline post CVA   Problem: Health Behavior/Discharge Planning: Goal: Ability to manage health-related needs will improve Outcome: Not Progressing   Problem: Clinical Measurements: Goal: Respiratory complications will improve Outcome: Not Progressing Note:  Coughing with consumption of liquids per day time RN   Problem: Nutrition: Goal: Adequate nutrition will be  maintained Outcome: Not Progressing

## 2018-09-21 NOTE — Plan of Care (Signed)

## 2018-09-21 NOTE — Progress Notes (Signed)
  Speech Language Pathology Treatment: Dysphagia  Patient Details Name: Darren Frazier MRN: [id number] DOB: 07-Jun-1941 Today's Date: 09/21/2018 Time: 8381-8403 SLP Time Calculation (min) (ACUTE ONLY): 12 min  Assessment / Plan / Recommendation Clinical Impression  Pt seen for follow-up to determine if ready for advancement of solids. RN reports pt tolerating medications crushed in puree. Unfortunately, pt appears more confused, less alert than during assessment; max cues required for sustained attention and arousal. He accepts bites of puree, and appears to have adequate airway protection, however there is prolonged bolus formation/distractibility increasing risk for aspiration. Would continue full liquids at this time, meds crushed in puree. May have bites of puree from floor stock with RN if alert and attentive, but do not feel he is ready yet for advancement to solid diet. Will follow up.    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 Seen by SLP during admission, advanced to regular/thin liquid diet. D/c to rehab but was not accepted due to hypoxemia, required intubation on return to ED on 09/17/18. Extubated 09/19/18.       SLP Plan  Continue with current plan of care       Recommendations  Diet recommendations: Thin liquid;Other(comment)(may have puree/pudding from floor stock with RN if alert) Liquids provided via: Cup;Straw Medication Administration: Crushed with puree Supervision: Staff to assist with self feeding;Full supervision/cueing for compensatory strategies;Trained caregiver to feed patient Compensations: Slow rate;Small sips/bites;Minimize environmental distractions Postural Changes and/or Swallow Maneuvers: Seated upright 90 degrees                Oral Care Recommendations: Oral care BID Follow up  Recommendations: 24 hour supervision/assistance;Skilled Nursing facility SLP Visit Diagnosis: Dysphagia, unspecified (R13.10) Plan: Continue with current plan of care       GO              Rondel Baton, MS, CCC-SLP Speech-Language Pathologist Acute Rehabilitation Services Pager: [phone] Office: [phone]   Arlana Lindau 09/21/2018, 12:30 PM

## 2018-09-22 DIAGNOSIS — I5033 Acute on chronic diastolic (congestive) heart failure: Secondary | ICD-10-CM

## 2018-09-22 DIAGNOSIS — J9622 Acute and chronic respiratory failure with hypercapnia: Secondary | ICD-10-CM

## 2018-09-22 DIAGNOSIS — I6310 Cerebral infarction due to embolism of unspecified precerebral artery: Secondary | ICD-10-CM

## 2018-09-22 LAB — COMPREHENSIVE METABOLIC PANEL
ALT: 22 U/L (ref 0–44)
AST: 30 U/L (ref 15–41)
Albumin: 2.6 g/dL — ABNORMAL LOW (ref 3.5–5.0)
Alkaline Phosphatase: 82 U/L (ref 38–126)
Anion gap: 9 (ref 5–15)
BUN: 15 mg/dL (ref 8–23)
CO2: 31 mmol/L (ref 22–32)
Calcium: 9.2 mg/dL (ref 8.9–10.3)
Chloride: 100 mmol/L (ref 98–111)
Creatinine, Ser: 1.21 mg/dL (ref 0.61–1.24)
GFR calc Af Amer: >60 mL/min (ref >60)
GFR calc non Af Amer: 57 mL/min — ABNORMAL LOW (ref >60)
GLUCOSE: 148 mg/dL — AB (ref 70–99)
Potassium: 3.8 mmol/L (ref 3.5–5.1)
SODIUM: 140 mmol/L (ref 135–145)
Total Bilirubin: 0.7 mg/dL (ref 0.3–1.2)
Total Protein: 6.6 g/dL (ref 6.5–8.1)

## 2018-09-22 LAB — AMMONIA: Ammonia: 16 umol/L (ref 9–35)

## 2018-09-22 LAB — CBC
HCT: 36 % — ABNORMAL LOW (ref 39.0–52.0)
Hemoglobin: 10.9 g/dL — ABNORMAL LOW (ref 13.0–17.0)
MCH: 29.9 pg (ref 26.0–34.0)
MCHC: 30.3 g/dL (ref 30.0–36.0)
MCV: 98.9 fL (ref 80.0–100.0)
Platelets: 287 10*3/uL (ref 150–400)
RBC: 3.64 MIL/uL — ABNORMAL LOW (ref 4.22–5.81)
RDW: 14 % (ref 11.5–15.5)
WBC: 8.4 10*3/uL (ref 4.0–10.5)
nRBC: 0 % (ref 0.0–0.2)

## 2018-09-22 LAB — CULTURE, BLOOD (ROUTINE X 2): Culture: NO GROWTH

## 2018-09-22 LAB — FOLATE: Folate: 18.7 ng/mL (ref >5.9)

## 2018-09-22 LAB — VITAMIN B12: Vitamin B-12: 566 pg/mL (ref 180–914)

## 2018-09-22 MED ORDER — ADULT MULTIVITAMIN W/MINERALS CH
1.0000 | ORAL_TABLET | Freq: Every day | ORAL | Status: DC
  Administered 2018-09-22 – 2018-09-30 (×9): 1 via ORAL
  Filled 2018-09-22 (×9): qty 1

## 2018-09-22 MED ORDER — GLUCERNA SHAKE PO LIQD
237.0000 mL | Freq: Three times a day (TID) | ORAL | Status: DC
  Administered 2018-09-22 – 2018-09-29 (×16): 237 mL via ORAL

## 2018-09-22 MED ORDER — HEPARIN SODIUM (PORCINE) 5000 UNIT/ML IJ SOLN: 5000.0000 [IU] | Freq: Three times a day (TID) | INTRAMUSCULAR | Status: DC

## 2018-09-22 NOTE — Plan of Care (Signed)
Patient slowly progressing toward care goals.  Patient able to maintain O2 sats on Room Air.  He is still quite confused and disoriented; mitts on bilateral hands to protect lines.  Condom cath in place for urinary incontinence.  Continued dressings/treatments to areas on right abdomen/groin.  Will continue to monitor.

## 2018-09-22 NOTE — Progress Notes (Addendum)
PROGRESS NOTE        PATIENT DETAILS Name: Darren Frazier Age: 78 y.o. Sex: male Date of Birth: 07/19/1941 Admit Date: 09/17/2018 Admitting Physician Carin Hock, MD NUU:[id number], Susanne Borders III, MD  Brief Narrative: Patient is a 78 y.o. male with a history of HTN, HLP, diastolic CHF, T2 DM, that suffered a stroke on 08/27/2018. The stroke left him with pronounced right sided weakness and caused him to be hospitalized until 09/17/2018. Within hours of his discharge however, he was readmitted for signs of respiratory failure thought to be secondary to pulmonary edema. Subsequently, he was intubated for 2 days. He has exhibited signs of metabolic encephalopathy during his hospital stay in addition to his baseline confusion caused by the stroke on 08/27/2018.  Subjective: Pt is currently alert and oriented to time and place, although his orientation/level of confusion does seem to wax and wane during the day. Does not report any pain. Pt appears frustrated by the restraints placed on his hands. Furthermore he reports:  No chest pain No abdominal pain No SOB or dyspnea   Assessment/Plan:  Metabolic Encephalopathy  : Pt has a baseline confusion since his stroke on 08/27/2018, and this is continuing. His confusion was worse leading up to his readmission, and this was likely due to the respiratory failure he was experiencing (ie his hypoxic/hypercapnic state).  Head CT shows no new damage/trauma since the stroke on 08/27/2018.    Chronic Afib: Heart rate stable in the 90-100 bpm range. Will continue Metoprolol and Eliquis.  Patient has been monitored on telemetry, but this will be discontinued today as he has been stable and controlled.   Acute Hypoxic Respiratory Failure: This has largely resolved over the last couple of days. His breathing is calm, not labored, and his oxygenation is above 90% on RA. We will encourage him to work with physical therapy and will  monitor his oxygenation with exertion.    Complications following CVA: Pt exhibits right sided weakness and dysphagia. Will continue current diet of thickened liquids and mechanically soft food. Encourage cooperation with physical therapy for additional strengthening. Upon discharge, pt will be treated in an outpatient stroke rehab unit for further management and care.    Acute on Chronic Diastolic CHF: This has largely resolved. Pulmonary edema present upon readmission. Diuresed with IV lasix. Continue metoprolol and monitor daily weights.     Hyperlipidemia: Continue ezetimibe   Urethral stricture:  Foley catheter placed. Healthy levels of urine output recorded. No bladder distention or pain present.    Telemetry (independently reviewed): Pt has been on telemetry due to sustained afib. However, this will be discontinued today as his rate is controlled with metoprolol and he is on clotting prophylaxis (eliquis). Also, reducing the amount of wires attached to him will help his frustrations and delirium.   Morning labs/Imaging ordered: CBC, CMP, Ammonia, Folate, Vit B12  DVT Prophylaxis: Eliquis  Code Status: Full code  Family Communication: Spouse at bedside - the assessment and plan for the day have been discussed with her.   Disposition Plan:  Remain inpatient and remove restraints. Once he is able to consistently abstain from restraint placement, he will be discharged to a stroke rehab facility for further management.   Antimicrobial agents: Anti-infectives (From admission, onward)   Start     Dose/Rate Route Frequency Ordered Stop   09/18/18  2300  vancomycin (VANCOCIN) 1,750 mg in sodium chloride 0.9 % 500 mL IVPB  Status:  Discontinued     1,750 mg 250 mL/hr over 120 Minutes Intravenous Every 24 hours 09/17/18 2034 09/18/18 0816   09/18/18 1000  ceFEPIme (MAXIPIME) 2 g in sodium chloride 0.9 % 100 mL IVPB  Status:  Discontinued     2 g 200 mL/hr over 30 Minutes Intravenous  Every 12 hours 09/18/18 0726 09/18/18 0816   09/17/18 2045  vancomycin (VANCOCIN) 2,500 mg in sodium chloride 0.9 % 500 mL IVPB     2,500 mg 250 mL/hr over 120 Minutes Intravenous  Once 09/17/18 2034 09/17/18 2319   09/17/18 2030  ceFEPIme (MAXIPIME) 2 g in sodium chloride 0.9 % 100 mL IVPB     2 g 200 mL/hr over 30 Minutes Intravenous  Once 09/17/18 2021 09/17/18 2124      MEDICATIONS: Scheduled Meds: . amiodarone  200 mg Oral Daily  . apixaban  5 mg Oral BID  . Chlorhexidine Gluconate Cloth  6 each Topical Daily  . cholecalciferol  1,000 Units Oral Daily  . ezetimibe  10 mg Oral Daily  . feeding supplement (GLUCERNA SHAKE)  237 mL Oral TID BM  . levothyroxine  100 mcg Oral QAC breakfast  . metoprolol tartrate  50 mg Oral BID  . multivitamin with minerals  1 tablet Oral Daily  . QUEtiapine  75 mg Oral QHS  . sodium chloride flush  10-40 mL Intracatheter Q12H  . vitamin C  500 mg Oral Daily   Continuous Infusions: PRN Meds:.bisacodyl, docusate, fluticasone, levalbuterol, senna-docusate, sodium chloride flush   PHYSICAL EXAM: Vital signs: Vitals:   09/21/18 2350 09/22/18 0500 09/22/18 0747 09/22/18 1143  BP: (!) 143/98  140/74 120/81  Pulse: 90   91  Resp:    13  Temp: 98.7 F (37.1 C)  (!) 97.3 F (36.3 C) 98.1 F (36.7 C)  TempSrc: Axillary  Oral Oral  SpO2: 96%  97% 95%  Weight:  128.1 kg    Height:       Filed Weights   09/19/18 0600 09/20/18 0413 09/22/18 0500  Weight: 122.4 kg 123.4 kg 128.1 kg   Body mass index is 39.39 kg/m.   General appearance :Awake, alert, not in any distress. Speech mumbled, but understandable.   Resp: Good air entry bilaterally  CVS: Heart rate slightly tachycardic, extra heart sound heard consistent with atrial fibrillation.  GI: Non tender and not distended with no gaurding, rigidity or rebound.No organomegaly Extremities: B/L Lower Ext shows no edema, both legs are warm to touch Neurology:  Speech mumbled, presents with right  sided weakness Psychiatric: Exhibits signs of fluctuating delirium - at times is oriented to time and place. At others, he is not.    I have personally reviewed following labs and imaging studies  LABORATORY DATA: CBC: Recent Labs  Lab 09/17/18 1938 09/17/18 1946 09/18/18 0734 09/19/18 0431 09/20/18 0402 09/22/18 0433  WBC 12.3*  --  12.9* 10.0 9.8 8.4  NEUTROABS 9.6*  --   --   --   --   --   HGB 12.1* 13.6 12.2* 10.3* 11.0* 10.9*  HCT 40.2 40.0 39.1 33.3* 35.5* 36.0*  MCV 105.8*  --  100.0 100.9* 102.9* 98.9  PLT 358  --  365 313 338 287    Basic Metabolic Panel: Recent Labs  Lab 09/17/18 1938 09/17/18 1946 09/18/18 0241 09/18/18 0734 09/19/18 0431 09/20/18 0402 09/22/18 0433  NA 141 139  --  140 140 143 140  K 4.8 5.2* 4.8 4.2 3.5 3.7 3.8  CL 104 100  --  98 98 99 100  CO2 28  --   --  27 31 33* 31  GLUCOSE 137* 143*  --  115* 159* 125* 148*  BUN 12 18  --  17 26* 23 15  CREATININE 1.14 1.20  --  1.51* 1.68* 1.18 1.21  CALCIUM 8.8*  --   --  9.4 9.0 9.3 9.2  MG  --   --  2.0 1.9 2.0 2.1  --   PHOS  --   --   --  3.0 3.6 3.7  --     GFR: Estimated Creatinine Clearance: 69.7 mL/min (by C-G formula based on SCr of 1.21 mg/dL).  Liver Function Tests: Recent Labs  Lab 09/17/18 1938 09/22/18 0433  AST 36 30  ALT 28 22  ALKPHOS 81 82  BILITOT 0.7 0.7  PROT 7.1 6.6  ALBUMIN 2.7* 2.6*   Recent Labs  Lab 09/17/18 1938  LIPASE 49   Recent Labs  Lab 09/22/18 0433  AMMONIA 16    Coagulation Profile: Recent Labs  Lab 09/18/18 0241  INR 1.73   CBG: Recent Labs  Lab 09/19/18 2325 09/20/18 0405 09/20/18 0733 09/20/18 1112 09/20/18 1641  GLUCAP 108* 102* 111* 120* 141*    Anemia Panel: Recent Labs    09/22/18 0433  VITAMINB12 566  FOLATE 18.7    Urine analysis:    Component Value Date/Time   COLORURINE AMBER (A) 09/17/2018 2019   APPEARANCEUR CLOUDY (A) 09/17/2018 2019   APPEARANCEUR Clear 03/07/2013 1849   LABSPEC 1.025  09/17/2018 2019   LABSPEC 1.011 03/07/2013 1849   PHURINE 5.0 09/17/2018 2019   GLUCOSEU NEGATIVE 09/17/2018 2019   GLUCOSEU Negative 03/07/2013 1849   HGBUR LARGE (A) 09/17/2018 2019   BILIRUBINUR NEGATIVE 09/17/2018 2019   BILIRUBINUR Negative 03/07/2013 1849   KETONESUR NEGATIVE 09/17/2018 2019   PROTEINUR 100 (A) 09/17/2018 2019   NITRITE NEGATIVE 09/17/2018 2019   LEUKOCYTESUR LARGE (A) 09/17/2018 2019   LEUKOCYTESUR 2+ 03/07/2013 1849    Sepsis Labs: Lactic Acid, Venous    Component Value Date/Time   LATICACIDVEN 2.3 (HH) 09/18/2018 0241    MICROBIOLOGY: Recent Results (from the past 240 hour(s))  Urine Culture     Status: None   Collection Time: 09/15/18  4:25 PM  Result Value Ref Range Status   Specimen Description URINE, CLEAN CATCH  Final   Special Requests   Final    NONE Performed at [facility], [address], [place], [place] [address]    Culture   Final    Multiple bacterial morphotypes present, none predominant. Suggest appropriate recollection if clinically indicated.   Report Status 09/16/2018 FINAL  Final  Urine culture     Status: Abnormal   Collection Time: 09/17/18  8:19 PM  Result Value Ref Range Status   Specimen Description URINE, CATHETERIZED  Final   Special Requests NONE  Final   Culture (A)  Final    <10,000 COLONIES/mL INSIGNIFICANT GROWTH Performed at [facility], [address], [place], [place] [address]    Report Status 09/19/2018 FINAL  Final  Culture, blood (routine x 2)     Status: None   Collection Time: 09/17/18  8:36 PM  Result Value Ref Range Status   Specimen Description BLOOD BLOOD LEFT FOREARM  Final   Special Requests   Final    BOTTLES DRAWN AEROBIC  AND ANAEROBIC Blood Culture results may not be optimal due to an inadequate volume of blood received in culture bottles   Culture   Final    NO GROWTH 5 DAYS Performed at [facility], [address], [place], [place] [address]    Report Status  09/22/2018 FINAL  Final  Culture, blood (routine x 2)     Status: None (Preliminary result)   Collection Time: 09/18/18  2:39 AM  Result Value Ref Range Status   Specimen Description BLOOD LEFT FOOT  Final   Special Requests   Final    BOTTLES DRAWN AEROBIC ONLY Blood Culture results may not be optimal due to an inadequate volume of blood received in culture bottles   Culture   Final    NO GROWTH 4 DAYS Performed at [facility], [address], [place], [place] [address]    Report Status PENDING  Incomplete    RADIOLOGY STUDIES/RESULTS: Ct Angio Head W Or Wo Contrast  Result Date: 09/11/2018 CLINICAL DATA:  Initial evaluation for head trauma, ataxia, history of stroke. EXAM: CT ANGIOGRAPHY HEAD TECHNIQUE: Multidetector CT imaging of the head was performed using the standard protocol during bolus administration of intravenous contrast. Multiplanar CT image reconstructions and MIPs were obtained to evaluate the vascular anatomy. CONTRAST:  75mL ISOVUE-370 IOPAMIDOL (ISOVUE-370) INJECTION 76% COMPARISON:  Prior MRI from 08/28/2018 and CTA from 08/27/2018. FINDINGS: CT HEAD Brain: Moderately advanced cerebral atrophy with chronic small vessel ischemic disease. Multiple chronic cortical infarcts involving the bilateral cerebral hemispheres again seen, stable. Remote lacunar infarct present at the left basal ganglia. Recently identified acute left ACA/MCA territory infarcts grossly stable, better evaluated on recent MRI. No evidence for hemorrhagic transformation or other complication. No new intracranial hemorrhage. No other new acute large vessel territory infarct. No mass lesion, midline shift or mass effect. No hydrocephalus. No extra-axial fluid collection. Vascular: No hyperdense vessel. Extensive calcified atherosclerosis at the skull base. Skull: Scalp soft tissues and calvarium within normal limits. Sinuses: Few small retention cyst noted within the sphenoid sinuses and left maxillary  sinus. Paranasal sinuses are otherwise clear. No significant mastoid effusion. Orbits: Globes and orbital soft tissues demonstrate no acute finding. 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 Previously identified stenosis about the right ICA bifurcation partially visualized. Right ICA otherwise patent to the skull base. Moderate to advanced atheromatous plaque throughout the right carotid siphon with estimated stenosis of approximately 50-70%, stable. Distal reconstitution at the supraclinoid left ICA via collateral flow cross the circle-of-Willis. Short-segment severe stenosis of approximately 80% at the supraclinoid left ICA again noted. A1 segments remain patent, with hypoplastic left A1. Patent and normal anterior communicating artery. Anterior cerebral arteries widely patent distally without stenosis or occlusion. Multifocal atheromatous irregularity seen throughout the M1 segments bilaterally, with associated stenosis of the mid left M1 of up to approximately 50-70%. Mild to moderate multifocal narrowing at the right M1 of approximately 30-50%, stable. 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 Retrograde flow within the distal right V4 segment across the vertebrobasilar junction with opacification of the right PICA. Left vertebral artery remains patent as it crosses into the cranial vault. Multifocal atheromatous irregularity with short-segment severe approximate 80-90% stenosis at the distal left V4 segment just distal to the takeoff of the left PICA, stable. Left PICA is perfused. Basilar mildly tortuous and irregular but is patent to its distal  aspect without high-grade stenosis. Superior cerebral arteries patent bilaterally. Left vertebral artery primarily supplied via the basilar. Hypoplastic right P1 with prominent right posterior communicating  artery. PCAs demonstrate multifocal atheromatous irregularity but are patent to their distal aspects without high-grade stenosis. Venous sinuses: Grossly patent, although not well assessed due to arterial timing of the contrast bolus. Anatomic variants: None significant. Delayed phase: No abnormal enhancement. IMPRESSION: CT HEAD IMPRESSION 1. Continued normal expected interval evolution of previously identified small volume left cerebral infarcts, grossly stable in size and distribution from previous exams. No evidence for hemorrhagic transformation or other complication. 2. No other new acute intracranial abnormality. 3. Underlying advanced atrophy with chronic small vessel ischemic disease with multifocal chronic ischemic infarcts, stable. CTA HEAD IMPRESSION 1. Stable CTA of the head as compared to 08/27/2018. 2. Occluded left internal carotid artery, with distal reconstitution at the supraclinoid segment. Approximate 80% left supraclinoid stenosis unchanged. 3. Severe atherosclerotic disease of both carotid siphons, with approximately 50-70% stenosis on the right. 4. 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 Retrograde flow within the distal right V4 segment with perfusion of the right PICA. 5. Severe 80-90% stenosis at the distal left V4 segment, stable. 6. Extensive atheromatous irregularity throughout the remainder of the intracranial circulation, relatively stable from previous exam. No new large vessel occlusion or other significant finding. Electronically Signed   By: Rise Mu M.D.   On: 09/11/2018 00:44   Ct Angio Head W Or Wo Contrast  Result Date: 08/27/2018 CLINICAL DATA:  Acute presentation with speech disturbance. EXAM: CT ANGIOGRAPHY HEAD AND NECK TECHNIQUE: Multidetector CT imaging of the head and neck was performed using the standard protocol during bolus administration of intravenous contrast. Multiplanar CT image reconstructions and MIPs were obtained to evaluate the  vascular anatomy. Carotid stenosis measurements (when applicable) are obtained utilizing NASCET criteria, using the distal internal carotid diameter as the denominator. CONTRAST:  75mL ISOVUE-370 IOPAMIDOL (ISOVUE-370) INJECTION 76% COMPARISON:  CT same day. FINDINGS: CTA NECK FINDINGS Aortic arch: Aortic atherosclerosis.  No aneurysm or dissection. Right carotid system: Common carotid artery shows some atherosclerotic plaque but is widely patent to the bifurcation. There is soft and calcified plaque affecting the carotid bifurcation and ICA bulb region. There is severe stenosis in the distal bulb region with luminal diameter of 1 mm. The vessel is quite tortuous beyond that but widely patent to the upper cervical region, where there is soft and calcified plaque resulting in minimal diameter of 3.5 mm. Compared to an expected diameter of 5 mm, stenosis in the bulb is 80% or greater. Stenosis in the upper cervical region is 30%. Left carotid system: Common carotid artery shows atherosclerotic plaque but is sufficiently patent to the bifurcation region. There is calcified and soft plaque at the carotid bifurcation and ICA bulb. There is left internal carotid artery occlusion at the distal bulb. 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 The left vertebral artery shows 50% stenosis at its origin but is sufficiently patent beyond that through the cervical region to the foramen magnum. Skeleton: Mid cervical spondylosis. Other neck: No mass or lymphadenopathy. Upper chest: Interstitial prominence which could go along with fluid overload or mild edema. Review of the MIP images confirms the above findings CTA HEAD FINDINGS Anterior circulation: Left internal carotid artery is occluded without antegrade flow through the skull base. Right internal carotid artery shows atherosclerotic disease in the carotid siphon region with stenosis estimated at 50-70%. The anterior and middle cerebral  vessels  are patent. There is atherosclerotic irregularity of the M1 segment with stenosis of 50-70%. No acute vessel occlusion is identified. There is a chronic punctate calcified embolus in 1 of the insular branches, but this was present in 2014. On the left, there is reconstituted flow in the distal siphon. Severe stenosis of the supraclinoid ICA, 80% or greater. Flow is present in the anterior and middle cerebral vessels, secondary to reconstituted flow and flow through communicating arteries. There is 50% stenosis in the M1 segment. I do not see any occluded or missing large or medium vessels in the MCA territory. Posterior circulation: Right vertebral artery shows no antegrade flow at the foramen magnum. There is retrograde flow in the distal right vertebral. Left vertebral artery is patent at the foramen magnum. There is atherosclerotic disease of the V4 segment with stenosis estimated at 70%. There are serial stenoses. The vessel does show flow to the basilar, which shows atherosclerotic irregularity but is patent. Flow is present in the superior cerebellar and posterior cerebral arteries. Venous sinuses: Patent and normal. Anatomic variants: None significant. Delayed phase: No abnormal enhancement. 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 Severe irregular atherosclerotic disease of the right carotid bifurcation with stenosis of 80% in the bulb region and 30% in the distal ICA. 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 50% stenosis of the left vertebral artery origin. Severe atherosclerotic disease in both carotid siphon regions. Right siphon stenosis estimated at 50-70%. Reconstituted flow in the distal left siphon and supraclinoid region. 80% supraclinoid stenosis. Flow in both anterior and middle cerebral artery territories. Stenosis of both M1 segments estimated at 50-70%. I do not identify any large or medium vessel  occlusion acutely within the MCA branch vessels. Severely disease left V4 segment with serial stenoses proximal to the basilar estimated at 70%. Basilar atherosclerotic irregularity without flow limiting stenosis. Posterior circulation branch vessels do show flow, including right PICA which is supplied by retrograde flow in the distal right vertebral artery. Electronically Signed   By: Paulina Fusi M.D.   On: 08/27/2018 13:14   Dg Chest 2 View  Result Date: 08/27/2018 CLINICAL DATA:  Chest pain and shortness of breath.  Cardiomyopathy. EXAM: CHEST - 2 VIEW COMPARISON:  03/07/2013 FINDINGS: Mild cardiomegaly. Aortic atherosclerosis. Diffuse interstitial infiltrates, consistent with pulmonary edema. Mild subsegmental atelectasis also seen in the left upper lobe. No evidence of pulmonary consolidation or significant pleural effusion. IMPRESSION: Mild cardiomegaly and diffuse interstitial edema, consistent with congestive heart failure. Electronically Signed   By: Myles Rosenthal M.D.   On: 08/27/2018 12:02   Ct Head Wo Contrast  Result Date: 09/17/2018 CLINICAL DATA:  78 year old male presents unresponsive after being discharged earlier today. Recent history of stroke. EXAM: CT HEAD WITHOUT CONTRAST TECHNIQUE: Contiguous axial images were obtained from the base of the skull through the vertex without intravenous contrast. COMPARISON:  CTA head 09/10/2018; brain MRI 08/28/2018 FINDINGS: Brain: No new acute infarct identified. Continued evolution of a previously noted multifocal cortical infarcts without significant interval change or progression compared to 09/10/2018. Periventricular white matter hypoattenuation consistent with chronic microvascular ischemic white matter disease. Stable remote lacunar infarct of the left caudate head. Vascular: Atherosclerotic calcifications in the bilateral cavernous and supraclinoid internal carotid arteries. No hyperdense vascular sign. Skull: Normal. Negative for fracture or  focal lesion. Sinuses/Orbits: No acute finding. Other: None. IMPRESSION: 1. No new acute intracranial abnormality. 2. Expected continued evolution  of a multifocal bilateral cortical infarcts. 3. Chronic microvascular ischemic white matter disease. Electronically Signed   By: Malachy Moan M.D.   On: 09/17/2018 20:25   Ct Angio Neck W Or Wo Contrast  Result Date: 08/27/2018 CLINICAL DATA:  Acute presentation with speech disturbance. EXAM: CT ANGIOGRAPHY HEAD AND NECK TECHNIQUE: Multidetector CT imaging of the head and neck was performed using the standard protocol during bolus administration of intravenous contrast. Multiplanar CT image reconstructions and MIPs were obtained to evaluate the vascular anatomy. Carotid stenosis measurements (when applicable) are obtained utilizing NASCET criteria, using the distal internal carotid diameter as the denominator. CONTRAST:  75mL ISOVUE-370 IOPAMIDOL (ISOVUE-370) INJECTION 76% COMPARISON:  CT same day. FINDINGS: CTA NECK FINDINGS Aortic arch: Aortic atherosclerosis.  No aneurysm or dissection. Right carotid system: Common carotid artery shows some atherosclerotic plaque but is widely patent to the bifurcation. There is soft and calcified plaque affecting the carotid bifurcation and ICA bulb region. There is severe stenosis in the distal bulb region with luminal diameter of 1 mm. The vessel is quite tortuous beyond that but widely patent to the upper cervical region, where there is soft and calcified plaque resulting in minimal diameter of 3.5 mm. Compared to an expected diameter of 5 mm, stenosis in the bulb is 80% or greater. Stenosis in the upper cervical region is 30%. Left carotid system: Common carotid artery shows atherosclerotic plaque but is sufficiently patent to the bifurcation region. There is calcified and soft plaque at the carotid bifurcation and ICA bulb. There is left internal carotid artery occlusion at the distal bulb. Vertebral arteries: The right  vertebral artery is occluded at its origin. The left vertebral artery shows 50% stenosis at its origin but is sufficiently patent beyond that through the cervical region to the foramen magnum. Skeleton: Mid cervical spondylosis. Other neck: No mass or lymphadenopathy. Upper chest: Interstitial prominence which could go along with fluid overload or mild edema. Review of the MIP images confirms the above findings CTA HEAD FINDINGS Anterior circulation: Left internal carotid artery is occluded without antegrade flow through the skull base. Right internal carotid artery shows atherosclerotic disease in the carotid siphon region with stenosis estimated at 50-70%. The anterior and middle cerebral vessels are patent. There is atherosclerotic irregularity of the M1 segment with stenosis of 50-70%. No acute vessel occlusion is identified. There is a chronic punctate calcified embolus in 1 of the insular branches, but this was present in 2014. On the left, there is reconstituted flow in the distal siphon. Severe stenosis of the supraclinoid ICA, 80% or greater. Flow is present in the anterior and middle cerebral vessels, secondary to reconstituted flow and flow through communicating arteries. There is 50% stenosis in the M1 segment. I do not see any occluded or missing large or medium vessels in the MCA territory. Posterior circulation: Right vertebral artery shows no antegrade flow at the foramen magnum. There is retrograde flow in the distal right vertebral. Left vertebral artery is patent at the foramen magnum. There is atherosclerotic disease of the V4 segment with stenosis estimated at 70%. There are serial stenoses. The vessel does show flow to the basilar, which shows atherosclerotic irregularity but is patent. Flow is present in the superior cerebellar and posterior cerebral arteries. Venous sinuses: Patent and normal. Anatomic variants: None significant. Delayed phase: No abnormal enhancement. 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 Severe irregular atherosclerotic disease of the  right carotid bifurcation with stenosis of 80% in the bulb region and 30% in the distal ICA. 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 50% stenosis of the left vertebral artery origin. Severe atherosclerotic disease in both carotid siphon regions. Right siphon stenosis estimated at 50-70%. Reconstituted flow in the distal left siphon and supraclinoid region. 80% supraclinoid stenosis. Flow in both anterior and middle cerebral artery territories. Stenosis of both M1 segments estimated at 50-70%. I do not identify any large or medium vessel occlusion acutely within the MCA branch vessels. Severely disease left V4 segment with serial stenoses proximal to the basilar estimated at 70%. Basilar atherosclerotic irregularity without flow limiting stenosis. Posterior circulation branch vessels do show flow, including right PICA which is supplied by retrograde flow in the distal right vertebral artery. Electronically Signed   By: Paulina Fusi M.D.   On: 08/27/2018 13:14   Mr Brain Wo Contrast  Result Date: 08/28/2018 CLINICAL DATA:  Follow-up examination for acute stroke, right lower extremity weakness. Known left ICA occlusion. EXAM: MRI HEAD WITHOUT CONTRAST TECHNIQUE: Multiplanar, multiecho pulse sequences of the brain and surrounding structures were obtained without intravenous contrast. COMPARISON:  Prior CTs from 08/27/2018. FINDINGS: Brain: Generalized age-related cerebral atrophy. Patchy and confluent T2/FLAIR hyperintensity within the periventricular and deep white matter both cerebral hemispheres most consistent with chronic microvascular ischemic disease, moderate nature. Scatter remote cortical infarcts involving the posterior left frontoparietal region, right parietal lobe, and right frontal lobe are seen. Small remote right cerebellar infarct.  Remote lacunar infarct present at the left caudate head. Scattered chronic hemosiderin staining seen about several of these infarcts. Scattered multifocal foci of restricted diffusion involving the cortical subcortical left frontal, parietal, and temporal occipital regions, compatible with acute ischemic left MCA territory infarcts. Largest area of infarction seen at the high left frontal parietal region and measures 2.5 cm (series 3, image 48). Possible faint petechial hemorrhage about a few of these infarcts without frank hemorrhagic transformation or significant mass effect. Findings likely embolic in nature. No mass lesion, midline shift or mass effect. Mild diffuse ventricular prominence related global parenchymal volume loss without hydrocephalus. No extra-axial fluid collection. Pituitary gland within normal limits. Vascular: Abnormal flow void within the left ICA to its cavernous segment, compatible with previously identified left ICA occlusion. Major intracranial vascular flow voids otherwise maintained at the skull base. Skull and upper cervical spine: Craniocervical junction within normal limits. Upper cervical spine normal. Bone marrow signal intensity within normal limits. No scalp soft tissue abnormality. Sinuses/Orbits: Patient status post bilateral ocular lens replacement. Globes and orbital soft tissues demonstrate no acute finding. Scattered mucosal thickening seen throughout the paranasal sinuses with superimposed scattered air-fluid levels. Fluid seen within the nasopharynx. Patient likely intubated. Trace bilateral mastoid effusions noted. Inner ear structures normal. Other: None. IMPRESSION: 1. 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 Possible faint scattered associated petechial hemorrhage without frank hemorrhagic transformation or significant mass effect. 2. Abnormal flow void within the left ICA to its cavernous segment, consistent  with known left ICA occlusion. 3. 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 4. Moderately advanced cerebral atrophy with chronic small vessel ischemic disease. Electronically Signed   By: Rise Mu M.D.   On: 08/28/2018 15:33   Ct Abdomen Pelvis W Contrast  Result Date: 08/29/2018 CLINICAL DATA:  Abdominal distention.  Bloating. EXAM: CT ABDOMEN AND PELVIS WITH CONTRAST TECHNIQUE: Multidetector CT imaging of the abdomen and  pelvis was performed using the standard protocol following bolus administration of intravenous contrast. CONTRAST:  100mL OMNIPAQUE IOHEXOL 300 MG/ML  SOLN COMPARISON:  Abdominal radiograph dated 08/28/2018 and abdominal ultrasound dated 02/28/2016 FINDINGS: Lower chest: There are bilateral consolidative infiltrates in the lower lobes as well as minimal bilateral pleural effusions. Aortic atherosclerosis. Coronary artery calcifications. Hepatobiliary: Liver parenchyma is normal. No biliary ductal dilatation. Vicarious excretion of contrast in the normal appearing gallbladder. Pancreas: Unremarkable. No pancreatic ductal dilatation or surrounding inflammatory changes. Spleen: Normal in size without focal abnormality. Adrenals/Urinary Tract: Adrenal glands are normal. Normal right kidney. Large chronic cyst in the lower pole of the left kidney with some calcification in the wall. Maximum diameter of the cyst is approximately 9 cm. Stomach/Bowel: NG tube tip in the body of the stomach. There are few diverticula in the proximal sigmoid portion of the colon. There is moderate air in the colon without significant distention. Small bowel appears normal. Fluid and air in the stomach. Appendix has been removed. Vascular/Lymphatic: There is a poorly defined hematoma in the right inguinal region in the panniculus. This measures approximately 12 x 5 x 2 cm and is probably related to recent angiography. There are  several small lymph nodes in the right inguinal region, the largest being 15 mm in diameter. Extensive aortic atherosclerosis. No adenopathy. Reproductive: Dense calcification in the normal-sized prostate gland. Other: There is a small amount of ascites in the abdomen, nonspecific. Small periumbilical hernia containing only fat. Edema in the subcutaneous fat of both flanks, right greater than left. Slight edema in the panniculus and in the lateral aspect of both thighs. Musculoskeletal: No acute abnormalities. Multilevel degenerative disc disease in the thoracic spine and lumbar spine. IMPRESSION: 1. Small hematoma in the right inguinal region in the panniculus, probably secondary to the recent angiography. 2. Bilateral consolidative infiltrates in the posterior aspect of both lower lobes with tiny adjacent pleural effusions. 3. Small amount of nonspecific ascites. 4. Slightly prominent gas in the bowel without evidence of bowel obstruction. This probably represents an ileus. Electronically Signed   By: Francene BoyersJames  Maxwell M.D.   On: 08/29/2018 17:03   Ir Ct Head Ltd  Result Date: 08/31/2018 CLINICAL DATA:  New onset right-sided weakness with mild word-finding difficulties. 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 EXAM: IR ANGIO INTRA EXTRACRAN SEL COM CAROTID INNOMINATE UNI RIGHT MOD SED; IR PERCUTANEOUS ART THORMBECTOMY/INFUSION INTRACRANIAL INCLUDE DIAG ANGIO; IR ANGIO VERTEBRAL SEL SUBCLAVIAN INNOMINATE UNI LEFT MOD SED; IR CT HEAD LIMITED COMPARISON:  CT angiogram of the head and neck of 08/27/2018 MEDICATIONS: Heparin 3,000 units IV; . Ancef 2 g IV antibiotic was administered within 1 hour of the procedure ANESTHESIA/SEDATION: General anesthesia CONTRAST:  Isovue 300 approximately 100 mL FLUOROSCOPY TIME:  Fluoroscopy Time: 60 minutes 0 seconds  (4327 mGy). COMPLICATIONS: None immediate. TECHNIQUE: Informed written consent was obtained from the patient after a thorough discussion of the procedural risks, benefits and alternatives. All questions were addressed. Maximal Sterile Barrier Technique was utilized including caps, mask, sterile gowns, sterile gloves, sterile drape, hand hygiene and skin antiseptic. A timeout was performed prior to the initiation of the procedure. The right groin was prepped and draped in the usual sterile fashion. Thereafter using modified Seldinger technique, transfemoral access into the right common femoral artery was obtained without difficulty. Over a 0.035 inch  guidewire, a 5 French Pinnacle sheath was inserted. Through this, and also over 0.035 inch guidewire, a 5 [place] JB 1 catheter was advanced to the aortic arch region and selectively positioned in the right common carotid artery, the innominate artery , the left common carotid artery and the left vertebral artery. FINDINGS: The left common carotid artery tear g demonstrates a moderate tortuosity of the proximal left common carotid artery The left external carotid artery proximally has a mild to moderate stenosis. Is branches are otherwise normally opacified The left internal carotid artery at the bulb would demonstrates the complete angiographic occlusion with no evidence of a string sign on the delayed arterial images. The innominate artery arteriogram demonstrates patency of the right subclavian artery and the right common carotid artery proximally There is no angiographic evidence of the right vertebral artery The right common carotid bifurcation demonstrates the right external carotid artery and its major branches to be widely patent The right internal carotid artery just distal to the bulb has a severe 90% plus stenosis. Distal to this there is a U-shaped configuration of the right internal carotid artery. More distally the vessel is normal caliber The petrous the  cavernous segments and supraclinoid segments are widely patent There is mild atherosclerotic irregularity involving the proximal cavernous, and distal cavernous portions of the right internal carotid artery. Right posterior communicating artery is seen opacifying the right posterior cerebral artery and the distal basilar artery. The right middle cerebral artery has approximately 30 40% stenosis of the right M1 segment The trifurcation branches appear to be widely patent into the capillary and venous phases The right anterior cerebral artery A1 segment and distally demonstrate wide patency is into the capillary and venous phases There is simultaneous cross-filling via the anterior communicating artery of the left anterior cerebral artery A2 segment and distally. Also demonstrated is opacification of the left anterior cerebral A1 segment, with flow noted into the left middle cerebral artery distribution. Unopacified blood is seen in the left middle cerebral artery from the collateral circulation. 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 More distally the vessel is seen to opacify to the cranial skull base There is severe focal of focal stenosis of the left vertebrobasilar junction just distal to the origin of the left posterior-inferior cerebellar artery Additionally there is a approximately 70% stenosis of the proximal basilar artery More distally the basilar artery demonstrates mild caliber irregularity. There is a opacification of the left posterior cerebral artery the superior cerebellar arteries left greater than right, and the anterior-inferior cerebellar arteries Retrograde opacification of the right vertebrobasilar junction  to the level of the right posterior-inferior cerebellar arteries is noted. Caliber irregularity of the right posterior-inferior cerebellar artery suggests intracranial arteriosclerosis. Left internal carotid artery occlusion or revascularization attempt The diagnostic JB 1 catheter left common carotid artery was then exchanged over a 0.035 inch 300 cm a 7 French Pinnacle sheath in the right groin. A diagnostic JB 1 catheter were then advanced over the rise exchange guidewire to the descending thoracic aorta. The 7 French Pinnacle sheath was then exchanged over a 0.035 inch 100 cm Amplatz stiff guidewire for a a 8 French 55 cm Brite tip neurovascular sheath using biplane roadmap technique and constant fluoroscopic guidance. Good good  aspiration was obtained from the side port of the neurovascular sheath. This was then connected to continuous heparinized saline infusion. The embolus guidewire was then removed. Through the 8 French neurovascular sheath, over a 0.035 inch roadrunner guidewire, a 5 5 [place] JB 1 catheter was advanced to the recover region and select position in the left common carotid artery. Using biplane technique and constant fluoroscopic guidance, over the of the 5 inch roadrunner guidewire, the 5 [place] JB 1 catheter was advanced to the left external carotid artery and exchanged over a 0.035 inch 300 cm rise exchange guidewire for an 8 French 85 cm flow gap balloon guide catheter which had been prepped with 50% contrast and 50% heparinized saline infusion The guidewire was removed. Good aspiration obtained from the hub of the 8 [place] Flo guide catheter. Gentle contrast injection demonstrated no evidence of spasms dissections or of intraluminal filling defects. At this time, in a coaxial manner and with constant heparinized saline infusion, a tree approached 1 microcatheter was advanced over a point 0.014 inch soft tip synchro micro guidewire to the distal end of the Flo guide catheter The Flood  guide catheter was advanced to just inside the left internal carotid artery bulb. Thereafter, multiple attempts were made to advance the microcatheter over the micro guidewire first with Ace synchro soft and then a regular synchro guidewire. In 016 headliner double angled micro guidewire was also utilized in order to access the occluded left internal carotid artery. It was noted that there was a severe tortuosity at the junction of the proximal and the middle 1/3 of the left internal carotid artery Following multiple attempts it was decided to stop. A control was then performed through the [facility] guide catheter in the left common carotid artery which revealed brisk cross-filling of the left internal carotid artery distal cavernous and supraclinoid segments from the left external carotid artery branches via the ipsilateral ophthalmic artery Flow is noted into the left middle cerebral artery as well Also noted was a retrograde opacification from the left posterior communicating artery into the supraclinoid left ICA and then middle cerebral artery An earlier diagnostic catheter arteriogram of the right common carotid artery he had revealed partial cross-filling via the anterior communicating artery from the right internal carotid artery Given the the collateral circulation, it was decided to stop the procedure. Throughout the procedure, the patient's blood pressure and neurological status remained stable No evidence of hemodynamic instability was noted No gross mass-effect or move filling defects or extravasation was seen Prior to the attempted revascularization of the left internal carotid artery occlusion, the patient was given 180 mg of Brilinta, and 81 mg of aspirin orally the an orogastric tube. Also the patient was loaded with 3000 units IV heparin in order to facilitate revascularization. The the 8 [place] flu guide catheter was then retrieved and removed. The 8 [place] Brite tip neurovascular sheath was then  exchanged over a J-tip guidewire for a in 8 French 35 cm Brite tip neurovascular sheath. This was then connected to continuous heparinized saline infusion An arteriogram performed through this 3 5 cm 8 French Brite tip neurovascular sheath in the abdominal aorta reveal excellent flow through the common iliacs, the external and internal iliacs, and also the visualized portions of the common femoral arteries below the level of the inguinal ligaments. At the end of procedure, a CT of the brain revealed no evidence of gross intracranial hemorrhage,, or mass effect or midline shift. The patient was left intubated on account  of his wound at the time of his intubation The right groin appeared soft without evidence of a hematoma. Distal pulses in the dorsalis pedis, and posterior regions remained dopplerable bilaterally. The patient was then transferred to the neuro ICU for further post stroke management. IMPRESSION: . 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 Approximately 90% stenosis of the dominant left vertebrobasilar junction distal to the origin of the left posterior-inferior cerebellar artery, and of approximately 70% of the proximal basilar artery. 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 PLAN: Follow-up in the clinic 1 month post discharge Electronically Signed   By: Julieanne Cotton M.D.   On: 08/28/2018 16:08   Ct Cerebral Perfusion W Contrast  Result Date: 08/27/2018 CLINICAL DATA:  Right-sided weakness onset this morning. EXAM: CT PERFUSION BRAIN TECHNIQUE: Multiphase CT imaging of the brain was performed following IV bolus contrast injection. Subsequent parametric perfusion maps were calculated using RAPID software. CONTRAST:  40mL ISOVUE-370 IOPAMIDOL (ISOVUE-370) INJECTION 76% COMPARISON:  CT a head today FINDINGS: CT Brain  Perfusion Findings: CBF (<30%) Volume: 0mL Perfusion (Tmax>6.0s) volume: Mismatch Volume: ASPECTS on noncontrast CT Head today, not calculated due to extensive chronic ischemic change. No definite acute infarct on CT. Infarct Core: 0 mL Infarction Location:Delayed perfusion throughout the left MCA territory with apparent sparing of the basal ganglia. There is occlusion of the left internal carotid artery on CTA, age indeterminate. Left middle cerebral artery is diseased and may have delayed perfusion due to collateral circulation and stenosis. IMPRESSION: 155 mL of delayed perfusion in the left MCA territory. No core infarction. Delayed perfusion left MCA territory may be due to severe intracranial atherosclerotic disease as well as occlusion of the left internal carotid artery. Age of the internal carotid artery occlusion is indeterminate. It is possible this delayed perfusion is a chronic or possibly acute finding. Electronically Signed   By: Marlan Palau M.D.   On: 08/27/2018 16:12   Dg Chest Port 1 View  Result Date: 09/19/2018 CLINICAL DATA:  Respiratory failure EXAM: PORTABLE CHEST 1 VIEW COMPARISON:  09/18/2018 FINDINGS: Cardiac shadow is enlarged but stable. Aortic calcifications are again seen. Endotracheal tube, nasogastric catheter and right-sided PICC line are again noted and stable. Inspiratory effort is poor. Left basilar atelectasis and effusion is again noted. Vascular congestion is again seen although extension weighted by the poor inspiratory effort. IMPRESSION: Tubes and lines as described. Stable left basilar atelectasis with vascular congestion. Electronically Signed   By: Alcide Clever M.D.   On: 09/19/2018 07:27   Dg Chest Port 1 View  Result Date: 09/18/2018 CLINICAL DATA:  PICC line placement. EXAM: PORTABLE CHEST 1 VIEW COMPARISON:  09/17/2018. FINDINGS: Endotracheal tube and NG tube in stable position. Right PICC line noted over cavoatrial junction. Cardiomegaly with  pulmonary venous congestion and bilateral interstitial prominence. Basilar atelectasis. Findings suggest CHF. Small left pleural effusion. IMPRESSION: 1. Endotracheal tube and NG tube in stable position. Right PICC line noted with tip over cavoatrial junction. 2. Cardiomegaly with pulmonary venous congestion bilateral interstitial prominence consistent CHF. 3.  Bibasilar atelectasis. Electronically Signed   By: Maisie Fus  Register   On: 09/18/2018 11:00   Dg Chest Port 1 View  Result Date: 09/17/2018 CLINICAL DATA:  Post intubation. EXAM: PORTABLE CHEST 1 VIEW COMPARISON:  Radiographs 09/15/2018 and 09/14/2018. FINDINGS: 1949 hours. Endotracheal tube tip is 3.3 cm above the carina. Enteric tube projects  below the diaphragm with the tip projecting over the proximal stomach. Cardiomegaly, aortic atherosclerosis and pulmonary edema are again noted. There is mildly increased retrocardiac opacity which could reflect atelectasis or developing airspace disease. No pneumothorax or significant pleural effusion. IMPRESSION: 1. Satisfactory position of the endotracheal and enteric tubes. 2. Persistent edema suggesting congestive heart failure. Increasing retrocardiac pulmonary opacity. Electronically Signed   By: Carey Bullocks M.D.   On: 09/17/2018 20:10   Dg Chest Port 1 View  Result Date: 09/15/2018 CLINICAL DATA:  Shortness of breath, leukocytosis EXAM: PORTABLE CHEST 1 VIEW COMPARISON:  09/14/2018 FINDINGS: Cardiomegaly with vascular congestion and interstitial prominence, likely interstitial edema. This has improved since prior study. Suspect small effusions. No acute bony abnormality. IMPRESSION: Continued interstitial edema, slightly improved since prior study. Suspect small effusions. Electronically Signed   By: Charlett Nose M.D.   On: 09/15/2018 10:55   Dg Chest Port 1 View  Result Date: 09/14/2018 CLINICAL DATA:  History of congestive heart failure EXAM: PORTABLE CHEST 1 VIEW COMPARISON:  Chest radiograph  09/09/2018 FINDINGS: Monitoring leads overlie the patient. Stable cardiomegaly. Low lung volumes. Bilateral interstitial pulmonary opacities. Small bilateral pleural effusions. IMPRESSION: Cardiomegaly and mild interstitial edema. Electronically Signed   By: Annia Belt M.D.   On: 09/14/2018 08:04   Dg Chest Port 1 View  Result Date: 09/09/2018 CLINICAL DATA:  Congestive failure and shortness of breath EXAM: PORTABLE CHEST 1 VIEW COMPARISON:  09/08/2018 FINDINGS: Cardiac shadow remains enlarged. Aortic calcifications are again seen. Changes of congestive failure and interstitial edema are again noted and stable. Some minimal right basilar atelectasis is noted. IMPRESSION: Right basilar atelectasis. Stable changes of congestive failure and interstitial edema. Electronically Signed   By: Alcide Clever M.D.   On: 09/09/2018 10:42   Dg Chest Port 1 View  Result Date: 09/08/2018 CLINICAL DATA:  Short of breath EXAM: PORTABLE CHEST 1 VIEW COMPARISON:  09/03/2018 FINDINGS: Cardiac enlargement. Improvement in vascular congestion and edema since the prior study. Small pleural effusions and mild bibasilar atelectasis. Central venous catheter tip has been removed IMPRESSION: Improving congestive heart failure and edema. Electronically Signed   By: Marlan Palau M.D.   On: 09/08/2018 13:47   Dg Chest Port 1 View  Result Date: 09/03/2018 CLINICAL DATA:  Shortness of breath. EXAM: PORTABLE CHEST 1 VIEW COMPARISON:  Radiograph September 02, 2018. FINDINGS: Stable cardiomegaly. Atherosclerosis of thoracic aorta is noted. No pneumothorax is noted. Right internal jugular catheter is unchanged in position. Stable bilateral pulmonary edema is noted with minimal pleural effusions. Bony thorax is unremarkable. IMPRESSION: Stable cardiomegaly with bilateral pulmonary edema and minimal pleural effusions. Aortic Atherosclerosis (ICD10-I70.0). Electronically Signed   By: Lupita Raider, M.D.   On: 09/03/2018 12:33   Dg Chest Port  1 View  Result Date: 09/02/2018 CLINICAL DATA:  Acute respiratory distress. EXAM: PORTABLE CHEST 1 VIEW COMPARISON:  One-view chest x-ray 09/01/2018 FINDINGS: The heart is enlarged. Patient has been extubated. NG tube was removed. A right IJ line remains. Atherosclerotic changes are present at the aortic arch. Diffuse interstitial edema is stable. Bibasilar airspace disease is worse on the left. IMPRESSION: 1. Interval extubation and removal of NG tube. 2. Stable cardiomegaly and edema consistent with congestive heart failure. 3. Aortic atherosclerosis. Electronically Signed   By: Marin Roberts M.D.   On: 09/02/2018 07:29   Dg Chest Port 1 View  Result Date: 09/01/2018 CLINICAL DATA:  Respiratory failure. EXAM: PORTABLE CHEST 1 VIEW COMPARISON:  08/31/2018 FINDINGS: Endotracheal tube terminates  4.2 cm above the carina. Right jugular catheter terminates over the mid SVC. Enteric tube courses into the left upper abdomen. The cardiac silhouette remains enlarged. Patchy airspace opacities throughout both lungs have not significantly changed. IMPRESSION: Bilateral airspace disease without significant interval change. Electronically Signed   By: Sebastian Ache M.D.   On: 09/01/2018 06:54   Dg Chest Port 1 View  Result Date: 08/31/2018 CLINICAL DATA:  CXR for Respiratory failure. Hx of HTN, stroke, AAA, Diabetes, and Cardiomyopathy. EXAM: PORTABLE CHEST 1 VIEW COMPARISON:  08/30/2018 FINDINGS: Endotracheal tube is in place, tip 3.8 centimeters above the carina. Nasogastric tube is in place, tip beyond the gastroesophageal junction and off the image. A RIGHT IJ central line tip overlies the superior vena cava. Patient is rotated towards the LEFT. The heart is enlarged. There is persistent airspace filling opacity throughout the RIGHT lung, slightly improved. There is significant opacity in the LEFT lung base, consistent with atelectasis or consolidation and increased since 08/27/2018. IMPRESSION: 1. Slight  improvement in aeration of the RIGHT lung. 2. Increased opacity in the LEFT lung base. Electronically Signed   By: Norva Pavlov M.D.   On: 08/31/2018 09:52   Dg Chest Port 1 View  Result Date: 08/30/2018 CLINICAL DATA:  Respiratory failure. Cardiomyopathy. EXAM: PORTABLE CHEST 1 VIEW COMPARISON:  08/29/2018 and 08/27/2018 FINDINGS: Endotracheal tube and NG tube and central line appear unchanged and in good position. There has been progression of the bilateral diffuse pulmonary infiltrates, particularly in the right upper lung zone of the left lung base. Heart size and pulmonary vascularity are normal. No discrete effusions. Aortic atherosclerosis. No acute bone abnormality. IMPRESSION: Progressive bilateral pulmonary infiltrates. Aortic Atherosclerosis (ICD10-I70.0). Electronically Signed   By: Francene Boyers M.D.   On: 08/30/2018 08:48   Dg Chest Port 1 View  Result Date: 08/29/2018 CLINICAL DATA:  Central line placement EXAM: PORTABLE CHEST 1 VIEW COMPARISON:  08/29/2018 FINDINGS: Right central line is been placed with the tip in the SVC. No pneumothorax. Endotracheal tube and NG tube are unchanged. Cardiomegaly with vascular congestion and mild pulmonary edema, stable. IMPRESSION: Right central line tip in the SVC. No pneumothorax. Continued mild CHF. Electronically Signed   By: Charlett Nose M.D.   On: 08/29/2018 11:58   Dg Chest Port 1 View  Result Date: 08/29/2018 CLINICAL DATA:  PT presented to [facility] ED on 08/27/18 with complaints of right lower extremity weakness, dyspnea and chest pressure. Hx of stroke, Peripheral vascualar disease, cardiomyopathy, and A-fib. EXAM: PORTABLE CHEST 1 VIEW COMPARISON:  08/27/2018 FINDINGS: Mild enlargement of the cardiac silhouette, stable. No mediastinal or hilar masses. There is central vascular congestion. Mild hazy opacity noted in the perihilar regions and medial lung bases bilaterally. No convincing pleural effusion and no pneumothorax. Endotracheal  tube tip projects 3.8 cm above the Carina. Nasogastric tube passes below the diaphragm well into the stomach. IMPRESSION: 1. Findings are consistent with mild congestive heart failure with evidence of slight improvement when compared to 08/27/2018. No new abnormalities. 2. Support apparatus is well positioned. Electronically Signed   By: Amie Portland M.D.   On: 08/29/2018 07:26   Dg Chest Port 1 View  Result Date: 08/28/2018 CLINICAL DATA:  Shortness of Breath EXAM: PORTABLE CHEST 1 VIEW COMPARISON:  08/27/2018 FINDINGS: Cardiac shadow is enlarged in size. Aortic calcifications are noted. Endotracheal tube is noted at the level of the carina. This should be withdrawn 2-3 cm. Nasogastric catheter is noted within the stomach. Lungs are well aerated bilaterally  with mild interstitial edema similar to that seen on the prior exam. IMPRESSION: Interval intubation with the endotracheal tube at the level of the carina. This should be withdrawn 2-3 cm. Stable edema bilaterally. These results will be called to the ordering clinician or representative by the Radiologist Assistant, and communication documented in the PACS or zVision Dashboard. Electronically Signed   By: Alcide Clever M.D.   On: 08/28/2018 00:19   Dg Abd Portable 1v  Result Date: 08/28/2018 CLINICAL DATA:  Orogastric tube placement. EXAM: PORTABLE ABDOMEN - 1 VIEW COMPARISON:  None. FINDINGS: The patient's enteric tube is seen ending overlying the body of the stomach, with the side port about the fundus of the stomach. The visualized bowel gas pattern is grossly unremarkable. A small left pleural effusion is noted. Increased interstitial markings raise concern for pulmonary edema. Vascular congestion is noted. The patient's endotracheal tube is seen ending 2-3 cm above the carina. No acute osseous abnormalities are identified. IMPRESSION: 1. Enteric tube seen ending overlying the body of the stomach, with the side port about the fundus of the stomach.  2. Small left pleural effusion noted. Increased interstitial markings raise concern for pulmonary edema. Electronically Signed   By: Roanna Raider M.D.   On: 08/28/2018 22:08   Vas [place] Groin Pseudoaneurysm  Result Date: 09/16/2018  ARTERIAL PSEUDOANEURYSM  Exam: Right groin History: Follow up pseudoaneurysm. Comparison Study: 09/04/2018 Performing Technologist: Jeb Levering RDMS, RVT  Examination Guidelines: A complete evaluation includes B-mode imaging, spectral Doppler, color Doppler, and power Doppler as needed of all accessible portions of each vessel. Bilateral testing is considered an integral part of a complete examination. Limited examinations for reoccurring indications may be performed as noted. +------------+----------+--------+------+----------+ Right DuplexPSV (cm/s)WaveformPlaqueComment(s) +------------+----------+--------+------+----------+ CFA             77    biphasic                 +------------+----------+--------+------+----------+ Prox SFA        65    biphasic                 +------------+----------+--------+------+----------+ Right Vein comments:Patent common femoral vein.  Findings: An area with well defined borders measuring 4.0 cm x 2.2 cm was visualized arising off of the Right CFA with ultrasound characteristics of a pseudoaneurysm. Mixed echos within the structure suggest that it is partially thrombosed with a residual diameter  of 2.5 cm x 1.9 cm. The neck measures approximately 0.6 cm wide and 1.8 cm long.  Diagnosing physician: Coral Else MD Electronically signed by Coral Else MD on 09/16/2018 at 6:30:39 PM.   --------------------------------------------------------------------------------    Final    Vas [place] Lower Ext Arterial Pseudo Injection  Result Date: 09/17/2018  ARTERIAL PSEUDOANEURYSM  Exam: Right groin Performing Technologist: Blanch Media RVS Supporting Technologist: Jeb Levering RDMS, RVT  Examination Guidelines: A complete evaluation includes  B-mode imaging, spectral Doppler, color Doppler, and power Doppler as needed of all accessible portions of each vessel. Bilateral testing is considered an integral part of a complete examination. Limited examinations for reoccurring indications may be performed as noted.  Findings: An area with well defined borders measuring 2.1 cm x 2.3 cm was visualized with ultrasound characteristics of a pseudoaneurysm.  Summary: Successful thrombin injection into right groin pseudoaneurysm. Completed by Dr. Chestine Spore.  Post injection the right SFA is patent.  Diagnosing physician: Gretta Began MD Electronically signed by Gretta Began MD on 09/17/2018 at 8:08:45 PM.   --------------------------------------------------------------------------------  Final    Ir Percutaneous Art Thrombectomy/infusion Intracranial Inc Diag Angio  Result Date: 08/31/2018 CLINICAL DATA:  New onset right-sided weakness with mild word-finding difficulties. 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 EXAM: IR ANGIO INTRA EXTRACRAN SEL COM CAROTID INNOMINATE UNI RIGHT MOD SED; IR PERCUTANEOUS ART THORMBECTOMY/INFUSION INTRACRANIAL INCLUDE DIAG ANGIO; IR ANGIO VERTEBRAL SEL SUBCLAVIAN INNOMINATE UNI LEFT MOD SED; IR CT HEAD LIMITED COMPARISON:  CT angiogram of the head and neck of 08/27/2018 MEDICATIONS: Heparin 3,000 units IV; . Ancef 2 g IV antibiotic was administered within 1 hour of the procedure ANESTHESIA/SEDATION: General anesthesia CONTRAST:  Isovue 300 approximately 100 mL FLUOROSCOPY TIME:  Fluoroscopy Time: 60 minutes 0 seconds (4327 mGy). COMPLICATIONS: None immediate. TECHNIQUE: Informed written consent was obtained from the patient after a thorough discussion of the procedural risks, benefits and alternatives. All questions were addressed. Maximal Sterile Barrier Technique was  utilized including caps, mask, sterile gowns, sterile gloves, sterile drape, hand hygiene and skin antiseptic. A timeout was performed prior to the initiation of the procedure. The right groin was prepped and draped in the usual sterile fashion. Thereafter using modified Seldinger technique, transfemoral access into the right common femoral artery was obtained without difficulty. Over a 0.035 inch guidewire, a 5 French Pinnacle sheath was inserted. Through this, and also over 0.035 inch guidewire, a 5 [place] JB 1 catheter was advanced to the aortic arch region and selectively positioned in the right common carotid artery, the innominate artery , the left common carotid artery and the left vertebral artery. FINDINGS: The left common carotid artery tear g demonstrates a moderate tortuosity of the proximal left common carotid artery The left external carotid artery proximally has a mild to moderate stenosis. Is branches are otherwise normally opacified The left internal carotid artery at the bulb would demonstrates the complete angiographic occlusion with no evidence of a string sign on the delayed arterial images. The innominate artery arteriogram demonstrates patency of the right subclavian artery and the right common carotid artery proximally There is no angiographic evidence of the right vertebral artery The right common carotid bifurcation demonstrates the right external carotid artery and its major branches to be widely patent The right internal carotid artery just distal to the bulb has a severe 90% plus stenosis. Distal to this there is a U-shaped configuration of the right internal carotid artery. More distally the vessel is normal caliber The petrous the cavernous segments and supraclinoid segments are widely patent There is mild atherosclerotic irregularity involving the proximal cavernous, and distal cavernous portions of the right internal carotid artery. Right posterior communicating artery is seen  opacifying the right posterior cerebral artery and the distal basilar artery. The right middle cerebral artery has approximately 30 40% stenosis of the right M1 segment The trifurcation branches appear to be widely patent into the capillary and venous phases The right anterior cerebral artery A1 segment and distally demonstrate wide patency is into the capillary and venous phases There is simultaneous cross-filling via the anterior communicating artery of the left anterior cerebral artery A2 segment and distally. Also demonstrated is opacification of the left anterior cerebral A1 segment, with flow noted into the left middle cerebral artery distribution. Unopacified blood is seen in the left middle cerebral artery from the collateral circulation. The left subclavian arteriogram demonstrates mild stenosis at the origin of  the right subclavian artery Mild atherosclerotic disease is noted of the subclavian artery and in the region of the thyrocervical trunk The dominant left vertebral artery origin is widely patent The vessel demonstrates mild tortuosity just distal to this. More distally the vessel is seen to opacify to the cranial skull base There is severe focal of focal stenosis of the left vertebrobasilar junction just distal to the origin of the left posterior-inferior cerebellar artery Additionally there is a approximately 70% stenosis of the proximal basilar artery More distally the basilar artery demonstrates mild caliber irregularity. There is a opacification of the left posterior cerebral artery the superior cerebellar arteries left greater than right, and the anterior-inferior cerebellar arteries Retrograde opacification of the right vertebrobasilar junction to the level of the right posterior-inferior cerebellar arteries is noted. Caliber irregularity of the right posterior-inferior cerebellar artery suggests intracranial arteriosclerosis. Left internal carotid artery occlusion or revascularization attempt  The diagnostic JB 1 catheter left common carotid artery was then exchanged over a 0.035 inch 300 cm a 7 French Pinnacle sheath in the right groin. A diagnostic JB 1 catheter were then advanced over the rise exchange guidewire to the descending thoracic aorta. The 7 French Pinnacle sheath was then exchanged over a 0.035 inch 100 cm Amplatz stiff guidewire for a a 8 French 55 cm Brite tip neurovascular sheath using biplane roadmap technique and constant fluoroscopic guidance. Good good aspiration was obtained from the side port of the neurovascular sheath. This was then connected to continuous heparinized saline infusion. The embolus guidewire was then removed. Through the 8 French neurovascular sheath, over a 0.035 inch roadrunner guidewire, a 5 5 [place] JB 1 catheter was advanced to the recover region and select position in the left common carotid artery. Using biplane technique and constant fluoroscopic guidance, over the of the 5 inch roadrunner guidewire, the 5 [place] JB 1 catheter was advanced to the left external carotid artery and exchanged over a 0.035 inch 300 cm rise exchange guidewire for an 8 French 85 cm flow gap balloon guide catheter which had been prepped with 50% contrast and 50% heparinized saline infusion The guidewire was removed. Good aspiration obtained from the hub of the 8 [place] Flo guide catheter. Gentle contrast injection demonstrated no evidence of spasms dissections or of intraluminal filling defects. At this time, in a coaxial manner and with constant heparinized saline infusion, a tree approached 1 microcatheter was advanced over a point 0.014 inch soft tip synchro micro guidewire to the distal end of the Flo guide catheter The Flood guide catheter was advanced to just inside the left internal carotid artery bulb. Thereafter, multiple attempts were made to advance the microcatheter over the micro guidewire first with Ace synchro soft and then a regular synchro guidewire. In 016  headliner double angled micro guidewire was also utilized in order to access the occluded left internal carotid artery. It was noted that there was a severe tortuosity at the junction of the proximal and the middle 1/3 of the left internal carotid artery Following multiple attempts it was decided to stop. A control was then performed through the [facility] guide catheter in the left common carotid artery which revealed brisk cross-filling of the left internal carotid artery distal cavernous and supraclinoid segments from the left external carotid artery branches via the ipsilateral ophthalmic artery Flow is noted into the left middle cerebral artery as well Also noted was a retrograde opacification from the left posterior communicating artery into the supraclinoid left ICA and then middle  cerebral artery An earlier diagnostic catheter arteriogram of the right common carotid artery he had revealed partial cross-filling via the anterior communicating artery from the right internal carotid artery Given the the collateral circulation, it was decided to stop the procedure. Throughout the procedure, the patient's blood pressure and neurological status remained stable No evidence of hemodynamic instability was noted No gross mass-effect or move filling defects or extravasation was seen Prior to the attempted revascularization of the left internal carotid artery occlusion, the patient was given 180 mg of Brilinta, and 81 mg of aspirin orally the an orogastric tube. Also the patient was loaded with 3000 units IV heparin in order to facilitate revascularization. The the 8 [place] flu guide catheter was then retrieved and removed. The 8 [place] Brite tip neurovascular sheath was then exchanged over a J-tip guidewire for a in 8 French 35 cm Brite tip neurovascular sheath. This was then connected to continuous heparinized saline infusion An arteriogram performed through this 3 5 cm 8 French Brite tip neurovascular sheath in the abdominal  aorta reveal excellent flow through the common iliacs, the external and internal iliacs, and also the visualized portions of the common femoral arteries below the level of the inguinal ligaments. At the end of procedure, a CT of the brain revealed no evidence of gross intracranial hemorrhage,, or mass effect or midline shift. The patient was left intubated on account of his wound at the time of his intubation The right groin appeared soft without evidence of a hematoma. Distal pulses in the dorsalis pedis, and posterior regions remained dopplerable bilaterally. The patient was then transferred to the neuro ICU for further post stroke management. IMPRESSION: . 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 Approximately 90% stenosis of the dominant left vertebrobasilar junction distal to the origin of the left posterior-inferior cerebellar artery, and of approximately 70% of the proximal basilar artery. 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 PLAN: Follow-up in the clinic 1 month post discharge Electronically Signed   By: Julieanne Cotton M.D.   On: 08/28/2018 16:08   Ct Head Code Stroke Wo Contrast`  Result Date: 08/27/2018 CLINICAL DATA:  Code stroke. Slurred speech beginning 3 weeks ago. Right-sided weakness beginning today. EXAM: CT HEAD WITHOUT CONTRAST TECHNIQUE: Contiguous axial images were obtained from the base of the skull through the vertex without intravenous contrast. COMPARISON:  CT 03/07/2013 FINDINGS: Brain: The brainstem and cerebellum appear normal by CT. Cerebral hemispheres show age related volume loss. There are old cortical and subcortical infarctions in the right frontal lobe, left parietal vertex, left frontal lobe, and left frontoparietal vertex. The show atrophy and encephalomalacia with gliosis. There is no finding of  acute or subacute infarction on this CT. There is old lacunar infarction in the left caudate. Vascular: There is atherosclerotic calcification of the major vessels at the base of the brain. No evidence of acute hyperdense vessel. Skull: Negative Sinuses/Orbits: Clear/normal Other: None ASPECTS ([place] Stroke Program Early CT Score) Aspects is difficult in this patient with old infarctions. I do not suspect an acute insult. IMPRESSION: 1. No acute finding by CT. 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 2. 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 No acute finding is suspected by CT. 3. These results were called by telephone at the time of interpretation on 08/27/2018 at 11:47  am to Dr. Daryel November , who verbally acknowledged these results. Electronically Signed   By: Paulina Fusi M.D.   On: 08/27/2018 11:49   Vas [place] Lower Extremity Arterial Duplex  Result Date: 09/04/2018 LOWER EXTREMITY ARTERIAL DUPLEX STUDY  Current ABI: Not obtained Limitations: Cerebral angiogram 08/27/18, now with bleeding, pain, and bruising              at groin stick site. Performing Technologist: Gertie Fey MHA, RDMS, RVT, RDCS  Examination Guidelines: A complete evaluation includes B-mode imaging, spectral Doppler, color Doppler, and power Doppler as needed of all accessible portions of each vessel. Bilateral testing is considered an integral part of a complete examination. Limited examinations for reoccurring indications may be performed as noted.  Right Duplex Findings: +----------+--------+-----+--------+----------+--------+           PSV cm/sRatioStenosisWaveform  Comments +----------+--------+-----+--------+----------+--------+ CFA Prox  102                  monophasic         +----------+--------+-----+--------+----------+--------+ CFA Distal90                   monophasic          +----------+--------+-----+--------+----------+--------+ PTA Distal32                   monophasic         +----------+--------+-----+--------+----------+--------+ DP        44                   monophasic         +----------+--------+-----+--------+----------+--------+  Summary: Right: A right common femoral artery pseudoaneurysm was identified measuring 2.8 x 1.5 x 2.5 cm, with a neck measuring 1.3cm long x 0.6cm wide. Pseudoaneurysm measures 2.1cm from the surface of the skin. The proximal right common femoral artery exhibits a monophasic waveform, suggestive of possible aortoiliac obstruction. Distal posterior tibial artery exhibits monophasic flow, and the right dorsalis pedis artery exhibits monophasic flow, suggestive of proximal obstruction.  See table(s) above for measurements and observations. Electronically signed by Gretta Began MD on 09/04/2018 at 5:42:19 PM.    Final    [place] Ekg Site Rite  Result Date: 09/18/2018 If Site Rite image not attached, placement could not be confirmed due to current cardiac rhythm.  Ir Angio Intra Extracran Sel Com Carotid Innominate Uni R Mod Sed  Result Date: 08/31/2018 CLINICAL DATA:  New onset right-sided weakness with mild word-finding difficulties. 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 EXAM: IR ANGIO INTRA EXTRACRAN SEL COM CAROTID INNOMINATE UNI RIGHT MOD SED; IR PERCUTANEOUS ART THORMBECTOMY/INFUSION INTRACRANIAL INCLUDE DIAG ANGIO; IR ANGIO VERTEBRAL SEL SUBCLAVIAN INNOMINATE UNI LEFT MOD SED; IR CT HEAD LIMITED COMPARISON:  CT angiogram of the head and neck of 08/27/2018 MEDICATIONS: Heparin 3,000 units IV; . Ancef 2 g IV antibiotic was administered within 1 hour of the procedure ANESTHESIA/SEDATION: General anesthesia CONTRAST:  Isovue 300 approximately 100 mL FLUOROSCOPY TIME:   Fluoroscopy Time: 60 minutes 0 seconds (4327 mGy). COMPLICATIONS: None immediate. TECHNIQUE: Informed written consent was obtained from the patient after a thorough discussion of the procedural risks, benefits and alternatives. All questions were addressed. Maximal Sterile Barrier Technique was utilized including caps, mask, sterile gowns, sterile gloves, sterile drape, hand hygiene and skin antiseptic. A timeout was performed prior  to the initiation of the procedure. The right groin was prepped and draped in the usual sterile fashion. Thereafter using modified Seldinger technique, transfemoral access into the right common femoral artery was obtained without difficulty. Over a 0.035 inch guidewire, a 5 French Pinnacle sheath was inserted. Through this, and also over 0.035 inch guidewire, a 5 [place] JB 1 catheter was advanced to the aortic arch region and selectively positioned in the right common carotid artery, the innominate artery , the left common carotid artery and the left vertebral artery. FINDINGS: The left common carotid artery tear g demonstrates a moderate tortuosity of the proximal left common carotid artery The left external carotid artery proximally has a mild to moderate stenosis. Is branches are otherwise normally opacified The left internal carotid artery at the bulb would demonstrates the complete angiographic occlusion with no evidence of a string sign on the delayed arterial images. The innominate artery arteriogram demonstrates patency of the right subclavian artery and the right common carotid artery proximally There is no angiographic evidence of the right vertebral artery The right common carotid bifurcation demonstrates the right external carotid artery and its major branches to be widely patent The right internal carotid artery just distal to the bulb has a severe 90% plus stenosis. Distal to this there is a U-shaped configuration of the right internal carotid artery. More distally the  vessel is normal caliber The petrous the cavernous segments and supraclinoid segments are widely patent There is mild atherosclerotic irregularity involving the proximal cavernous, and distal cavernous portions of the right internal carotid artery. Right posterior communicating artery is seen opacifying the right posterior cerebral artery and the distal basilar artery. The right middle cerebral artery has approximately 30 40% stenosis of the right M1 segment The trifurcation branches appear to be widely patent into the capillary and venous phases The right anterior cerebral artery A1 segment and distally demonstrate wide patency is into the capillary and venous phases There is simultaneous cross-filling via the anterior communicating artery of the left anterior cerebral artery A2 segment and distally. Also demonstrated is opacification of the left anterior cerebral A1 segment, with flow noted into the left middle cerebral artery distribution. Unopacified blood is seen in the left middle cerebral artery from the collateral circulation. 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 More distally the vessel is seen to opacify to the cranial skull base There is severe focal of focal stenosis of the left vertebrobasilar junction just distal to the origin of the left posterior-inferior cerebellar artery Additionally there is a approximately 70% stenosis of the proximal basilar artery More distally the basilar artery demonstrates mild caliber irregularity. There is a opacification of the left posterior cerebral artery the superior cerebellar arteries left greater than right, and the anterior-inferior cerebellar arteries Retrograde  opacification of the right vertebrobasilar junction to the level of the right posterior-inferior cerebellar arteries is noted. Caliber irregularity of the right posterior-inferior cerebellar artery suggests intracranial arteriosclerosis. Left internal carotid artery occlusion or revascularization attempt The diagnostic JB 1 catheter left common carotid artery was then exchanged over a 0.035 inch 300 cm a 7 French Pinnacle sheath in the right groin. A diagnostic JB 1 catheter were then advanced over the rise exchange guidewire to the descending thoracic  aorta. The 7 French Pinnacle sheath was then exchanged over a 0.035 inch 100 cm Amplatz stiff guidewire for a a 8 French 55 cm Brite tip neurovascular sheath using biplane roadmap technique and constant fluoroscopic guidance. Good good aspiration was obtained from the side port of the neurovascular sheath. This was then connected to continuous heparinized saline infusion. The embolus guidewire was then removed. Through the 8 French neurovascular sheath, over a 0.035 inch roadrunner guidewire, a 5 5 [place] JB 1 catheter was advanced to the recover region and select position in the left common carotid artery. Using biplane technique and constant fluoroscopic guidance, over the of the 5 inch roadrunner guidewire, the 5 [place] JB 1 catheter was advanced to the left external carotid artery and exchanged over a 0.035 inch 300 cm rise exchange guidewire for an 8 French 85 cm flow gap balloon guide catheter which had been prepped with 50% contrast and 50% heparinized saline infusion The guidewire was removed. Good aspiration obtained from the hub of the 8 [place] Flo guide catheter. Gentle contrast injection demonstrated no evidence of spasms dissections or of intraluminal filling defects. At this time, in a coaxial manner and with constant heparinized saline infusion, a tree approached 1 microcatheter was advanced over a point 0.014 inch soft tip synchro micro guidewire to  the distal end of the Flo guide catheter The Flood guide catheter was advanced to just inside the left internal carotid artery bulb. Thereafter, multiple attempts were made to advance the microcatheter over the micro guidewire first with Ace synchro soft and then a regular synchro guidewire. In 016 headliner double angled micro guidewire was also utilized in order to access the occluded left internal carotid artery. It was noted that there was a severe tortuosity at the junction of the proximal and the middle 1/3 of the left internal carotid artery Following multiple attempts it was decided to stop. A control was then performed through the [facility] guide catheter in the left common carotid artery which revealed brisk cross-filling of the left internal carotid artery distal cavernous and supraclinoid segments from the left external carotid artery branches via the ipsilateral ophthalmic artery Flow is noted into the left middle cerebral artery as well Also noted was a retrograde opacification from the left posterior communicating artery into the supraclinoid left ICA and then middle cerebral artery An earlier diagnostic catheter arteriogram of the right common carotid artery he had revealed partial cross-filling via the anterior communicating artery from the right internal carotid artery Given the the collateral circulation, it was decided to stop the procedure. Throughout the procedure, the patient's blood pressure and neurological status remained stable No evidence of hemodynamic instability was noted No gross mass-effect or move filling defects or extravasation was seen Prior to the attempted revascularization of the left internal carotid artery occlusion, the patient was given 180 mg of Brilinta, and 81 mg of aspirin orally the an orogastric tube. Also the patient was loaded with 3000 units IV heparin in order to facilitate revascularization. The the 8 [place] flu guide catheter was then retrieved and removed. The 8  [place] Brite tip neurovascular sheath was then exchanged over a J-tip guidewire for a in 8 French 35 cm Brite tip neurovascular sheath. This was then connected to continuous heparinized saline infusion An arteriogram performed through this 3 5 cm 8 French Brite tip neurovascular sheath in the abdominal aorta reveal excellent flow through the common iliacs, the external and internal iliacs, and also the visualized portions of the common  femoral arteries below the level of the inguinal ligaments. At the end of procedure, a CT of the brain revealed no evidence of gross intracranial hemorrhage,, or mass effect or midline shift. The patient was left intubated on account of his wound at the time of his intubation The right groin appeared soft without evidence of a hematoma. Distal pulses in the dorsalis pedis, and posterior regions remained dopplerable bilaterally. The patient was then transferred to the neuro ICU for further post stroke management. IMPRESSION: . 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 Approximately 90% stenosis of the dominant left vertebrobasilar junction distal to the origin of the left posterior-inferior cerebellar artery, and of approximately 70% of the proximal basilar artery. 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 PLAN: Follow-up in the clinic 1 month post discharge Electronically Signed   By: Julieanne Cotton M.D.   On: 08/28/2018 16:08   Ir Angio Vertebral Sel Subclavian Innominate Uni L Mod Sed  Result Date: 08/31/2018 CLINICAL DATA:  New onset right-sided weakness with mild word-finding difficulties. 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 EXAM: IR  ANGIO INTRA EXTRACRAN SEL COM CAROTID INNOMINATE UNI RIGHT MOD SED; IR PERCUTANEOUS ART THORMBECTOMY/INFUSION INTRACRANIAL INCLUDE DIAG ANGIO; IR ANGIO VERTEBRAL SEL SUBCLAVIAN INNOMINATE UNI LEFT MOD SED; IR CT HEAD LIMITED COMPARISON:  CT angiogram of the head and neck of 08/27/2018 MEDICATIONS: Heparin 3,000 units IV; . Ancef 2 g IV antibiotic was administered within 1 hour of the procedure ANESTHESIA/SEDATION: General anesthesia CONTRAST:  Isovue 300 approximately 100 mL FLUOROSCOPY TIME:  Fluoroscopy Time: 60 minutes 0 seconds (4327 mGy). COMPLICATIONS: None immediate. TECHNIQUE: Informed written consent was obtained from the patient after a thorough discussion of the procedural risks, benefits and alternatives. All questions were addressed. Maximal Sterile Barrier Technique was utilized including caps, mask, sterile gowns, sterile gloves, sterile drape, hand hygiene and skin antiseptic. A timeout was performed prior to the initiation of the procedure. The right groin was prepped and draped in the usual sterile fashion. Thereafter using modified Seldinger technique, transfemoral access into the right common femoral artery was obtained without difficulty. Over a 0.035 inch guidewire, a 5 French Pinnacle sheath was inserted. Through this, and also over 0.035 inch guidewire, a 5 [place] JB 1 catheter was advanced to the aortic arch region and selectively positioned in the right common carotid artery, the innominate artery , the left common carotid artery and the left vertebral artery. FINDINGS: The left common carotid artery tear g demonstrates a moderate tortuosity of the proximal left common carotid artery The left external carotid artery proximally has a mild to moderate stenosis. Is branches are otherwise normally opacified The left internal carotid artery at the bulb would demonstrates the complete angiographic occlusion with no evidence of a string sign on the delayed arterial images. The innominate artery  arteriogram demonstrates patency of the right subclavian artery and the right common carotid artery proximally There is no angiographic evidence of the right vertebral artery The right common carotid bifurcation demonstrates the right external carotid artery and its major branches to be widely patent The right internal carotid artery just distal to the bulb has a severe 90% plus stenosis. Distal to this there is a U-shaped configuration of the right  internal carotid artery. More distally the vessel is normal caliber The petrous the cavernous segments and supraclinoid segments are widely patent There is mild atherosclerotic irregularity involving the proximal cavernous, and distal cavernous portions of the right internal carotid artery. Right posterior communicating artery is seen opacifying the right posterior cerebral artery and the distal basilar artery. The right middle cerebral artery has approximately 30 40% stenosis of the right M1 segment The trifurcation branches appear to be widely patent into the capillary and venous phases The right anterior cerebral artery A1 segment and distally demonstrate wide patency is into the capillary and venous phases There is simultaneous cross-filling via the anterior communicating artery of the left anterior cerebral artery A2 segment and distally. Also demonstrated is opacification of the left anterior cerebral A1 segment, with flow noted into the left middle cerebral artery distribution. Unopacified blood is seen in the left middle cerebral artery from the collateral circulation. 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 More distally the vessel is seen to opacify to the cranial skull base  There is severe focal of focal stenosis of the left vertebrobasilar junction just distal to the origin of the left posterior-inferior cerebellar artery Additionally there is a approximately 70% stenosis of the proximal basilar artery More distally the basilar artery demonstrates mild caliber irregularity. There is a opacification of the left posterior cerebral artery the superior cerebellar arteries left greater than right, and the anterior-inferior cerebellar arteries Retrograde opacification of the right vertebrobasilar junction to the level of the right posterior-inferior cerebellar arteries is noted. Caliber irregularity of the right posterior-inferior cerebellar artery suggests intracranial arteriosclerosis. Left internal carotid artery occlusion or revascularization attempt The diagnostic JB 1 catheter left common carotid artery was then exchanged over a 0.035 inch 300 cm a 7 French Pinnacle sheath in the right groin. A diagnostic JB 1 catheter were then advanced over the rise exchange guidewire to the descending thoracic aorta. The 7 French Pinnacle sheath was then exchanged over a 0.035 inch 100 cm Amplatz stiff guidewire for a a 8 French 55 cm Brite tip neurovascular sheath using biplane roadmap technique and constant fluoroscopic guidance. Good good aspiration was obtained from the side port of the neurovascular sheath. This was then connected to continuous heparinized saline infusion. The embolus guidewire was then removed. Through the 8 French neurovascular sheath, over a 0.035 inch roadrunner guidewire, a 5 5 [place] JB 1 catheter was advanced to the recover region and select position in the left common carotid artery. Using biplane technique and constant fluoroscopic guidance, over the of the 5 inch roadrunner guidewire, the 5 [place] JB 1 catheter was advanced to the left external carotid artery and exchanged over a 0.035 inch 300 cm rise exchange guidewire for an 8 French 85 cm flow gap balloon guide  catheter which had been prepped with 50% contrast and 50% heparinized saline infusion The guidewire was removed. Good aspiration obtained from the hub of the 8 [place] Flo guide catheter. Gentle contrast injection demonstrated no evidence of spasms dissections or of intraluminal filling defects. At this time, in a coaxial manner and with constant heparinized saline infusion, a tree approached 1 microcatheter was advanced over a point 0.014 inch soft tip synchro micro guidewire to the distal end of  the Flo guide catheter The Flood guide catheter was advanced to just inside the left internal carotid artery bulb. Thereafter, multiple attempts were made to advance the microcatheter over the micro guidewire first with Ace synchro soft and then a regular synchro guidewire. In 016 headliner double angled micro guidewire was also utilized in order to access the occluded left internal carotid artery. It was noted that there was a severe tortuosity at the junction of the proximal and the middle 1/3 of the left internal carotid artery Following multiple attempts it was decided to stop. A control was then performed through the [facility] guide catheter in the left common carotid artery which revealed brisk cross-filling of the left internal carotid artery distal cavernous and supraclinoid segments from the left external carotid artery branches via the ipsilateral ophthalmic artery Flow is noted into the left middle cerebral artery as well Also noted was a retrograde opacification from the left posterior communicating artery into the supraclinoid left ICA and then middle cerebral artery An earlier diagnostic catheter arteriogram of the right common carotid artery he had revealed partial cross-filling via the anterior communicating artery from the right internal carotid artery Given the the collateral circulation, it was decided to stop the procedure. Throughout the procedure, the patient's blood pressure and neurological status remained  stable No evidence of hemodynamic instability was noted No gross mass-effect or move filling defects or extravasation was seen Prior to the attempted revascularization of the left internal carotid artery occlusion, the patient was given 180 mg of Brilinta, and 81 mg of aspirin orally the an orogastric tube. Also the patient was loaded with 3000 units IV heparin in order to facilitate revascularization. The the 8 [place] flu guide catheter was then retrieved and removed. The 8 [place] Brite tip neurovascular sheath was then exchanged over a J-tip guidewire for a in 8 French 35 cm Brite tip neurovascular sheath. This was then connected to continuous heparinized saline infusion An arteriogram performed through this 3 5 cm 8 [place] Brite tip neurovascular sheath in the abdominal aorta reveal excellent flow through the common iliacs, the external and internal iliacs, and also the visualized portions of the common femoral arteries below the level of the inguinal ligaments. At the end of procedure, a CT of the brain revealed no evidence of gross intracranial hemorrhage,, or mass effect or midline shift. The patient was left intubated on account of his wound at the time of his intubation The right groin appeared soft without evidence of a hematoma. Distal pulses in the dorsalis pedis, and posterior regions remained dopplerable bilaterally. The patient was then transferred to the neuro ICU for further post stroke management. IMPRESSION: . 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 Approximately 90% stenosis of the dominant left vertebrobasilar junction distal to the origin of the left posterior-inferior cerebellar artery, and of approximately 70% of the proximal basilar artery. 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 PLAN: Follow-up in the clinic 1 month post  discharge Electronically Signed   By: Julieanne Cotton M.D.   On: 08/28/2018 16:08     LOS: 5 days   Laurette Schimke, PA-S  [facility]  If 7PM-7AM, please contact night-coverage  Please page via [email or web address]-click on MD name and type text message  09/22/2018, 1:18 PM

## 2018-09-22 NOTE — Progress Notes (Signed)
PROGRESS NOTE    Darren Frazier  EEF:[id number] DOB: [birth year] DOA: 09/17/2018 PCP: Lynnea Ferrier, MD    Brief Narrative:  78 year old male who presented with respiratory distress.  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  Patient was discharged to rehab facility, where he was found in severe hypoxic respiratory failure, transferred back to the emergency department where he was diagnosed with acute pulmonary edema and required intubation and invasive mechanical ventilation.   He was extubated 3 days later January 17 and transferred to hospitalist team general 19.  Assessment & Plan:   Active Problems:   * No active hospital problems. *   1. Acute encephalopathy with delirium. Clinically patient is able to respond to simple questions, able to demonstrate attention and organized thinking. Will continue neuro checks and aspiration precautions. Po intake as tolerated. Attempt to remove mittens. Continue seroquel.   2. 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 He does have right sided weakness and slow speech, will continue antiplatelet therapy and physical therapy evaluation.   3. Paroxysmal atrial fibrillation. Rate has remained well controlled, patient constantly pulling leads, will dc telemetry for now, to prevent worsening agitation. Continue metoprolol nd amiodarone and anticoagulation with apixaban.    4. T2DM. Continue glucose cover and monitoring with insulin sliding scale.   5. HTN. Continue blood pressure control with   6. Hypothyroid. Continue with levothyroxine.   DVT prophylaxis: enoxaparin   Code Status: full Family Communication: I spoke with patient's wife at the bedside and  all questions were addressed.  Disposition Plan/ discharge barriers: waiting for placement.   Body mass index is 39.39 kg/m. Malnutrition Type:  Nutrition Problem: Inadequate oral intake Etiology: dysphagia   Malnutrition Characteristics:  Signs/Symptoms: per patient/family report(full liquid diet)   Nutrition Interventions:  Interventions: Magic cup, MVI, Glucerna shake  RN Pressure Injury Documentation:     Consultants:     Procedures:     Antimicrobials:       Subjective: Patient has been confused and altered, continue to have mittens, no nausea or vomiting, has been tolerating po well.   Objective: Vitals:   09/21/18 2350 09/22/18 0500 09/22/18 0747 09/22/18 1143  BP: (!) 143/98  140/74 120/81  Pulse: 90   91  Resp:    13  Temp: 98.7 F (37.1 C)  (!) 97.3 F (36.3 C) 98.1 F (36.7 C)  TempSrc: Axillary  Oral Oral  SpO2: 96%  97% 95%  Weight:  128.1 kg    Height:        Intake/Output Summary (Last 24 hours) at 09/22/2018 1336 Last data filed at 09/22/2018 1315 Gross per 24 hour  Intake 807 ml  Output 450 ml  Net 357 ml   Filed Weights   09/19/18 0600 09/20/18 0413 09/22/18 0500  Weight: 122.4 kg 123.4 kg 128.1 kg    Examination:   General: deconditioned  Neurology: Awake and alert, non focal/ able to responds to questions appropriately. Positive confusion but easy to be re-directed.   E ENT: mild pallor, no icterus, oral mucosa moist Cardiovascular: No JVD. S1-S2 present, rhythmic, no gallops, rubs, or murmurs. No lower extremity edema. Pulmonary: positive breath sounds bilaterally, adequate air movement, no wheezing, rhonchi or rales. Gastrointestinal. Abdomen with no organomegaly, non tender,  no rebound or guarding Skin. No rashes Musculoskeletal: no joint deformities     Data Reviewed: I have personally reviewed following labs and imaging studies  CBC: Recent Labs  Lab 09/17/18 1938 09/17/18 1946 09/18/18 0734  09/19/18 0431 09/20/18 0402 09/22/18 0433  WBC 12.3*  --  12.9* 10.0 9.8 8.4  NEUTROABS 9.6*  --   --   --   --   --   HGB 12.1* 13.6 12.2* 10.3* 11.0* 10.9*  HCT 40.2 40.0 39.1 33.3* 35.5* 36.0*  MCV 105.8*  --  100.0 100.9* 102.9* 98.9  PLT 358  --  365 313 338 287   Basic Metabolic Panel: Recent Labs  Lab 09/17/18 1938 09/17/18 1946 09/18/18 0241 09/18/18 0734 09/19/18 0431 09/20/18 0402 09/22/18 0433  NA 141 139  --  140 140 143 140  K 4.8 5.2* 4.8 4.2 3.5 3.7 3.8  CL 104 100  --  98 98 99 100  CO2 28  --   --  27 31 33* 31  GLUCOSE 137* 143*  --  115* 159* 125* 148*  BUN 12 18  --  17 26* 23 15  CREATININE 1.14 1.20  --  1.51* 1.68* 1.18 1.21  CALCIUM 8.8*  --   --  9.4 9.0 9.3 9.2  MG  --   --  2.0 1.9 2.0 2.1  --   PHOS  --   --   --  3.0 3.6 3.7  --    GFR: Estimated Creatinine Clearance: 69.7 mL/min (by C-G formula based on SCr of 1.21 mg/dL). Liver Function Tests: Recent Labs  Lab 09/17/18 1938 09/22/18 0433  AST 36 30  ALT 28 22  ALKPHOS 81 82  BILITOT 0.7 0.7  PROT 7.1 6.6  ALBUMIN 2.7* 2.6*   Recent Labs  Lab 09/17/18 1938  LIPASE 49   Recent Labs  Lab 09/22/18 0433  AMMONIA 16   Coagulation Profile: Recent Labs  Lab 09/18/18 0241  INR 1.73   Cardiac Enzymes: No results for input(s): CKTOTAL, CKMB, CKMBINDEX, TROPONINI in the last 168 hours. BNP (last 3 results) No results for input(s): PROBNP in the last 8760 hours. HbA1C: No results for input(s): HGBA1C in the last 72 hours. CBG: Recent Labs  Lab 09/19/18 2325 09/20/18 0405 09/20/18 0733 09/20/18 1112 09/20/18 1641  GLUCAP 108* 102* 111* 120* 141*   Lipid Profile: No results for input(s): CHOL, HDL, LDLCALC, TRIG, CHOLHDL, LDLDIRECT in the last 72 hours. Thyroid Function Tests: No results for input(s): TSH, T4TOTAL, FREET4, T3FREE, THYROIDAB in the last 72 hours. Anemia Panel: Recent Labs    09/22/18 0433  VITAMINB12 566  FOLATE 18.7      Radiology Studies: I  have reviewed all of the imaging during this hospital visit personally     Scheduled Meds: . amiodarone  200 mg Oral Daily  . apixaban  5 mg Oral BID  . Chlorhexidine Gluconate Cloth  6 each Topical Daily  . cholecalciferol  1,000 Units Oral Daily  . ezetimibe  10 mg Oral Daily  . feeding supplement (GLUCERNA SHAKE)  237 mL Oral TID BM  . levothyroxine  100 mcg Oral QAC breakfast  . metoprolol tartrate  50 mg Oral BID  . multivitamin with minerals  1 tablet Oral Daily  . QUEtiapine  75 mg Oral QHS  . sodium chloride flush  10-40 mL Intracatheter Q12H  . vitamin C  500 mg Oral Daily   Continuous Infusions:   LOS: 5 days  Jondavid Schreier Gerome Apley, MD [facility] Pager [phone]

## 2018-09-22 NOTE — Progress Notes (Addendum)
Nutrition Follow-up  DOCUMENTATION CODES:   Obesity unspecified  INTERVENTION:   -Increase Glucerna Shake po TID, each supplement provides 220 kcal and 10 grams of protein -Magic Cup TID with meals, each supplement provides 290 kcals and 9 grams protein -MVI with minerals daily  NUTRITION DIAGNOSIS:   Inadequate oral intake related to dysphagia as evidenced by per patient/family report(full liquid diet).  Ongoing  GOAL:   Patient will meet greater than or equal to 90% of their needs  Progressing  MONITOR:   PO intake, Supplement acceptance, Diet advancement, Labs, Weight trends, Skin, I & O's  REASON FOR ASSESSMENT:   Consult Assessment of nutrition requirement/status  ASSESSMENT:   78 yo male with PMH of HTN, HLD, DM, dysrhythmia, stroke (Dec 2019), PVD, tremors, morbid obesity, vitamin D deficiency who was re-admitted 1/15 with hypoxia. Just discharged 1/15, but sent right back to the hospital with hypoxia requiring intubation.  1/16- PICC placed, TF initiated 1/17- extubated 1/18- s/p BSE- advanced to full liquids 1/19- repeat BSE- not ready to advancement to solids yet  Reviewed I/O's: +287 ml x 24 hours and -1.9 L since admission  Pt and wife at bedside. Pt a little confused, but wife reports mentation has improved since yesterday (pt able to identify his wife, but believes he is at a SNF). He was requesting SLP to come see him and was able to recall basic details of SLP sessions. Per pt wife, intake still variable, but overall has improved. Pt consumed most of this breakfast this AM (however, pt inaccurately identified what he was served). Pt wife reports pt consumed 100% of pudding and 50% of cream soup for lunch yesterday. He also enjoys Glucerna supplements, which he has been consuming between meals.   Discussed rationale for full liquid diet order; pt eager to see SLP to determine possible diet advancement. Discussed importance of good meal and supplement  intake to promote healing. Pt and wife amenable to continue Glucerna supplements.   Albumin has a half-life of 21 days and is strongly affected by stress response and inflammatory process, therefore, do not expect to see an improvement in this lab value during acute hospitalization. When a patient presents with low albumin, it is likely skewed due to the acute inflammatory response.  Unless it is suspected that patient had poor PO intake or malnutrition prior to admission, then RD should not be consulted solely for low albumin. Note that low albumin is no longer used to diagnose malnutrition; [facility] uses the new malnutrition guidelines published by the American Society for Parenteral and Enteral Nutrition (A.S.P.E.N.) and the Academy of Nutrition and Dietetics (AND).    Labs reviewed: CBGS: 102-141.   Diet Order:   Diet Order            Diet full liquid Room service appropriate? Yes; Fluid consistency: Thin  Diet effective now              EDUCATION NEEDS:   Education needs have been addressed  Skin:  Skin Integrity Issues:: Other (Comment) Other: 2 non pressure wounds to R hand, puncture wound R abdomen  Last BM:  09/20/18  Height:   Ht Readings from Last 1 Encounters:  09/17/18 5\' 11"  (1.803 m)    Weight:   Wt Readings from Last 1 Encounters:  09/22/18 128.1 kg    Ideal Body Weight:  78.2 kg  BMI:  Body mass index is 39.39 kg/m.  Estimated Nutritional Needs:   Kcal:  2150-2350  Protein:  115-130 grams  Fluid:  >2.1 L    Nikia Mangino A. Mayford Knife, RD, LDN, CDE Pager: [phone] After hours Pager: [phone]

## 2018-09-22 NOTE — Progress Notes (Signed)
Pt keeps pulling off leads, placed on med surg by medical team, tele staff aware , care continues.

## 2018-09-22 NOTE — Progress Notes (Signed)
  Speech Language Pathology Treatment: Dysphagia  Patient Details Name: Darren Frazier MRN: [id number] DOB: 1941-02-17 Today's Date: 09/22/2018 Time: 5852-7782 SLP Time Calculation (min) (ACUTE ONLY): 19 min  Assessment / Plan / Recommendation Clinical Impression  Pt is alert but remains distractible, requiring Mod-Max cues for sustained attention to mastication with very small pieces of soft solids. Immediate coughing is observed during oral preparation, concerning for premature spillage into the airway. His oral acceptance and transit appears to be much swifter with thin liquids and purees, although he did have delayed coughing observed several minutes after PO intake had stopped. His wife reports coughing intermittently throughout the day. Recommend advancing diet to Dys 1 textures and thin liquids. SLP to f/u for tolerance and to determine if MBS is warranted given recent CVA, acute respiratory issues, and recent intubation.   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 Seen by SLP during admission, advanced to regular/thin liquid diet. D/c to rehab but was not accepted due to hypoxemia, required intubation on return to ED on 09/17/18. Extubated 09/19/18.       SLP Plan  Continue with current plan of care       Recommendations  Diet recommendations: Dysphagia 1 (puree);Thin liquid Liquids provided via: Cup;Straw Medication Administration: Crushed with puree Supervision: Staff to assist with self feeding;Full supervision/cueing for compensatory strategies;Trained caregiver to feed patient Compensations: Slow rate;Small sips/bites;Minimize environmental distractions Postural Changes and/or Swallow Maneuvers: Seated upright 90 degrees                Oral Care Recommendations: Oral care BID Follow up Recommendations:  Skilled Nursing facility SLP Visit Diagnosis: Dysphagia, unspecified (R13.10) Plan: Continue with current plan of care       GO                Maxcine Ham 09/22/2018, 3:20 PM  Maxcine Ham, M.A. CCC-SLP Acute Herbalist [phone] Office [phone]

## 2018-09-23 DIAGNOSIS — I482 Chronic atrial fibrillation, unspecified: Secondary | ICD-10-CM

## 2018-09-23 DIAGNOSIS — G9341 Metabolic encephalopathy: Secondary | ICD-10-CM

## 2018-09-23 DIAGNOSIS — E785 Hyperlipidemia, unspecified: Secondary | ICD-10-CM

## 2018-09-23 LAB — CULTURE, BLOOD (ROUTINE X 2): Culture: NO GROWTH

## 2018-09-23 NOTE — Progress Notes (Signed)
  Speech Language Pathology Treatment: Dysphagia  Patient Details Name: Darren Frazier MRN: [id number] DOB: Nov 05, 1940 Today's Date: 09/23/2018 Time: 8325-4982 SLP Time Calculation (min) (ACUTE ONLY): 9 min  Assessment / Plan / Recommendation Clinical Impression  Pt consumed bites of puree and sips of thin liquids via straw without overt signs of aspiration when given Min cues for sustained attention. He does have multiple subswallows across boluses, but he does not take larger volumes at a time (such as the 3 oz water screen) to truly challenge his swallowing mechanism. Would continue with current diet with additional SLP f/u.   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 Seen by SLP during admission, advanced to regular/thin liquid diet. D/c to rehab but was not accepted due to hypoxemia, required intubation on return to ED on 09/17/18. Extubated 09/19/18.       SLP Plan  Continue with current plan of care       Recommendations  Diet recommendations: Dysphagia 1 (puree);Thin liquid Liquids provided via: Cup;Straw Medication Administration: Crushed with puree Supervision: Staff to assist with self feeding;Full supervision/cueing for compensatory strategies;Trained caregiver to feed patient Compensations: Slow rate;Small sips/bites;Minimize environmental distractions Postural Changes and/or Swallow Maneuvers: Seated upright 90 degrees                Oral Care Recommendations: Oral care BID Follow up Recommendations: Skilled Nursing facility SLP Visit Diagnosis: Dysphagia, unspecified (R13.10) Plan: Continue with current plan of care       GO                Maxcine Ham 09/23/2018, 11:05 AM  Maxcine Ham, M.A. CCC-SLP Acute Herbalist [phone] Office  [phone]

## 2018-09-23 NOTE — Progress Notes (Signed)
Pt refused his diet, just had a taste of each , wife visiting and is aware.

## 2018-09-23 NOTE — NC FL2 (Signed)
[place] MEDICAID FL2 LEVEL OF CARE SCREENING TOOL     IDENTIFICATION  Patient Name: Darren Frazier Birthdate: [birth year] Sex: male Admission Date (Current Location): 09/17/2018  [facility] and [place] Number:  Chiropodist and Address:  [facility], [address] [place], [place] [address]      Provider Number: [id number]  Attending Physician Name and Address:  Coralie Keens  Relative Name and Phone Number:       Current Level of Care: Hospital Recommended Level of Care: Skilled Nursing Facility Prior Approval Number:    Date Approved/Denied:   PASRR Number: [id number]  Discharge Plan: SNF    Current Diagnoses: Patient Active Problem List   Diagnosis Date Noted  . Sinus pause 09/16/2018  . Atrial fibrillation (HCC) 09/15/2018  . CHF (congestive heart failure) (HCC) 09/15/2018  . 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 Hyperlipemia 09/15/2018  . Morbid obesity (HCC) 09/15/2018  . OSA (obstructive sleep apnea) 09/15/2018  . 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 UTI (urinary tract infection) 09/15/2018  . Protein-calorie malnutrition (HCC) 09/15/2018  . Right hemiparesis (HCC) 08/27/2018  . 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 Essential hypertension 02/20/2018  . Diabetes (HCC) 02/20/2018  . 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 Chronic venous insufficiency 03/31/2017  . Leg pain 03/31/2017    Orientation RESPIRATION BLADDER Height & Weight     Self  Normal Incontinent, External catheter Weight: 282 lb 6.6 oz (128.1 kg) Height:  5\' 11"  (180.3 cm)  BEHAVIORAL SYMPTOMS/MOOD NEUROLOGICAL BOWEL NUTRITION STATUS  Other (Comment)(Had mittens on due to confusion/pulling at lines. Has been otherwise cooperative. Mittens were discontinued today.) (None) Incontinent Diet(DYS 1)  AMBULATORY STATUS COMMUNICATION OF NEEDS Skin    Extensive Assist Verbally Skin abrasions, Bruising, Other (Comment)(Blister, Cellulitis, Cracking, MASD, Skin tear, Weeping. Non-pressure wounds: Right lower abdomen (Hydrocolloid every 3 days) and right posterior hand (foam). Diabetic ulcer on left toe (No dressing). Blisters/Opened/Avulsion on right anterior arm (Foam))                       Personal Care Assistance Level of Assistance  Bathing, Feeding, Dressing Bathing Assistance: Maximum assistance Feeding assistance: Limited assistance Dressing Assistance: Maximum assistance     Functional Limitations Info  Sight, Hearing, Speech Sight Info: Adequate Hearing Info: Adequate Speech Info: Adequate    SPECIAL CARE FACTORS FREQUENCY  PT (By licensed PT), OT (By licensed OT), Speech therapy     PT Frequency: 5 x week OT Frequency: 5 x week     Speech Therapy Frequency: 5 x week      Contractures Contractures Info: Not present    Additional Factors Info  Code Status, Allergies, Isolation Precautions Code Status Info: Full code Allergies Info: Ace Inhibitors, Omeprazole, Statins.     Isolation Precautions Info: Contact: MRSA     Current Medications (09/23/2018):  This is the current hospital active medication list Current Facility-Administered Medications  Medication Dose Route Frequency Provider Last Rate Last Dose  . amiodarone (PACERONE) tablet 200 mg  200 mg Oral Daily Lynnell Catalan, MD   200 mg at 09/23/18 0841  . apixaban (ELIQUIS) tablet 5 mg  5 mg Oral BID Lynnell Catalan, MD   5 mg at 09/23/18 0841  . bisacodyl (DULCOLAX) suppository 10 mg  10 mg Rectal Daily PRN Lynnell Catalan, MD      . Chlorhexidine Gluconate Cloth  2 % PADS 6 each  6 each Topical Daily Lynnell Catalan, MD   6 each at 09/23/18 1232  . cholecalciferol (VITAMIN D3) tablet 1,000 Units  1,000 Units Oral Daily Lynnell Catalan, MD   1,000 Units at 09/23/18 0840  . docusate (COLACE) 50 MG/5ML liquid 100 mg  100 mg Per Tube BID PRN Lynnell Catalan,  MD   100 mg at 09/23/18 0840  . ezetimibe (ZETIA) tablet 10 mg  10 mg Oral Daily Lynnell Catalan, MD   10 mg at 09/23/18 0841  . feeding supplement (GLUCERNA SHAKE) (GLUCERNA SHAKE) liquid 237 mL  237 mL Oral TID BM Arrien, York Ram, MD   237 mL at 09/23/18 1232  . fluticasone (FLONASE) 50 MCG/ACT nasal spray 2 spray  2 spray Each Nare Daily PRN Agarwala, Daleen Bo, MD      . levalbuterol (XOPENEX) nebulizer solution 0.63 mg  0.63 mg Nebulization Q6H PRN Agarwala, Daleen Bo, MD      . levothyroxine (SYNTHROID, LEVOTHROID) tablet 100 mcg  100 mcg Oral QAC breakfast Lonia Blood, MD   100 mcg at 09/23/18 0720  . metoprolol tartrate (LOPRESSOR) tablet 50 mg  50 mg Oral BID Lonia Blood, MD   50 mg at 09/23/18 [phone]  . multivitamin with minerals tablet 1 tablet  1 tablet Oral Daily Arrien, York Ram, MD   1 tablet at 09/23/18 0840  . QUEtiapine (SEROQUEL) tablet 75 mg  75 mg Oral QHS Lonia Blood, MD   75 mg at 09/22/18 2216  . senna-docusate (Senokot-S) tablet 1 tablet  1 tablet Oral QHS PRN Agarwala, Daleen Bo, MD      . sodium chloride flush (NS) 0.9 % injection 10-40 mL  10-40 mL Intracatheter Q12H Agarwala, Ravi, MD   10 mL at 09/23/18 1233  . sodium chloride flush (NS) 0.9 % injection 10-40 mL  10-40 mL Intracatheter PRN Lynnell Catalan, MD      . vitamin C (ASCORBIC ACID) tablet 500 mg  500 mg Oral Daily Lynnell Catalan, MD   500 mg at 09/23/18 0840     Discharge Medications: Please see discharge summary for a list of discharge medications.  Relevant Imaging Results:  Relevant Lab Results:   Additional Information SS#: [id number]  Margarito Liner, LCSW

## 2018-09-23 NOTE — Progress Notes (Signed)
Nutrition Follow-up  DOCUMENTATION CODES:   Obesity unspecified  INTERVENTION:   -Continue Glucerna Shake po TID, each supplement provides 220 kcal and 10 grams of protein -Continue Magic Cup TID with meals, each supplement provides 290 kcals and 9 grams protein -Continue MVI with minerals daily  NUTRITION DIAGNOSIS:   Inadequate oral intake related to dysphagia as evidenced by per patient/family report.  Ongoing  GOAL:   Patient will meet greater than or equal to 90% of their needs  Progressing  MONITOR:   PO intake, Supplement acceptance, Diet advancement, Labs, Weight trends, Skin, I & O's  REASON FOR ASSESSMENT:   Consult Assessment of nutrition requirement/status  ASSESSMENT:   78 yo male with PMH of HTN, HLD, DM, dysrhythmia, stroke (Dec 2019), PVD, tremors, morbid obesity, vitamin D deficiency who was re-admitted 1/15 with hypoxia. Just discharged 1/15, but sent right back to the hospital with hypoxia requiring intubation.  1/16- PICC placed, TF initiated 1/17- extubated 1/18- s/p BSE- advanced to full liquids 1/19- repeat BSE- not ready to advancement to solids yet  1/20- advanced to dysphagia 1 diet with thin liquids  Reviewed I/O's: +436 ml x 24 hours and -1.5 L since admission  Pt and wife resting quietly at time of visit. Observed lunch meal tray- pt consumed about 75%. Pt continues to eat well; noted meal completion 75-80%. Per pt wife, pt continues to consume Glucerna supplements.   Reviewed CSW notes; plan to transfer to SNF once medically stable.   Labs reviewed: CBGS: 102-141.   Diet Order:   Diet Order            DIET - DYS 1 Room service appropriate? Yes; Fluid consistency: Thin  Diet effective now              EDUCATION NEEDS:   Education needs have been addressed  Skin:  Skin Integrity Issues:: Other (Comment) Other: 2 non pressure wounds to R hand, puncture wound R abdomen  Last BM:  09/20/18  Height:   Ht Readings from Last  1 Encounters:  09/17/18 5\' 11"  (1.803 m)    Weight:   Wt Readings from Last 1 Encounters:  09/22/18 128.1 kg    Ideal Body Weight:  78.2 kg  BMI:  Body mass index is 39.39 kg/m.  Estimated Nutritional Needs:   Kcal:  2150-2350  Protein:  115-130 grams  Fluid:  >2.1 L    Laketta Soderberg A. Mayford Knife, RD, LDN, CDE Pager: [phone] After hours Pager: [phone]

## 2018-09-23 NOTE — Progress Notes (Signed)
Physical Therapy Treatment Patient Details Name: Darren Frazier MRN: [id number] DOB: 1940-12-01 Today's Date: 09/23/2018    History of Present Illness Pt is a 78 y/o male who was discharged on 09/17/18 after hospitalization 12/25 -1/15 for acute stroke. Pt was refused at rehab facility due to hypoxia. He required intubation for airway protection on arrival to ER (intubated 1/15-1/17).  Head CT neg.  CXR c/w pulmonary edema. PMH including but not limited to recent L MCA/ACA stroke, DM, HTN, PVD, tremors.    PT Comments    Patient seen for activity progression and therapeutic intervention. Session focused on sitting balance and dynamic task performance. Patient responding well to facilitation for midline but unable to maintain without cues. Attempted sit <> stand x2 with limited success. Current POC remains appropriate.[name]   Follow Up Recommendations  SNF     Equipment Recommendations  None recommended by PT    Recommendations for Other Services       Precautions / Restrictions Precautions Precautions: Fall Required Braces or Orthoses: Other Brace Other Brace: PRAFO boots bil Restrictions Weight Bearing Restrictions: No    Mobility  Bed Mobility Overal bed mobility: Needs Assistance Bed Mobility: Supine to Sit;Sit to Supine Rolling: Max assist Sidelying to sit: Max assist;+2 for physical assistance Supine to sit: +2 for physical assistance;Max assist Sit to supine: +2 for physical assistance;Total assist Sit to sidelying: +2 for physical assistance;+2 for safety/equipment;Total assist General bed mobility comments: pt required increased time, multimodal cueing to maintain attention to task; pt able to move L LE towards EOB; heavy physical assistance for trunk elevation; total A x2 to return to supine  Transfers Overall transfer level: Needs assistance Equipment used: 2 person hand held assist(wrap around support with gait belt and chuck pad) Transfers: Sit to/from  Stand Sit to Stand: Max assist;Total assist;+2 physical assistance;From elevated surface         General transfer comment: use of gait belt and chuck pad with BLE blocked out to attempt standing  Ambulation/Gait                 Stairs             Wheelchair Mobility    Modified Rankin (Stroke Patients Only) Modified Rankin (Stroke Patients Only) Pre-Morbid Rankin Score: No significant disability Modified Rankin: Severe disability     Balance Overall balance assessment: Needs assistance Sitting-balance support: Single extremity supported Sitting balance-Leahy Scale: Poor Sitting balance - Comments: right lateral lean Postural control: Posterior lean   Standing balance-Leahy Scale: Zero                              Cognition Arousal/Alertness: Awake/alert Behavior During Therapy: Restless Overall Cognitive Status: Impaired/Different from baseline Area of Impairment: Orientation;Attention;Memory;Following commands;Safety/judgement;Awareness;Problem solving                 Orientation Level: Disoriented to;Place;Time;Situation Current Attention Level: Focused Memory: Decreased recall of precautions;Decreased short-term memory Following Commands: Follows one step commands inconsistently;Follows one step commands with increased time Safety/Judgement: Decreased awareness of deficits;Decreased awareness of safety Awareness: Intellectual Problem Solving: Slow processing;Decreased initiation;Difficulty sequencing;Requires verbal cues;Requires tactile cues General Comments: Pt stating he is in [place]. Pt requiring Max cues.       Exercises General Exercises - Lower Extremity Ankle Circles/Pumps: PROM;Right;10 reps Heel Slides: PROM;Right;10 reps    General Comments        Pertinent Vitals/Pain Pain Assessment: Faces Faces Pain Scale: Hurts little  more Pain Location: discomfot with ROM and right hand movement Pain Descriptors /  Indicators: Grimacing Pain Intervention(s): Monitored during session    Home Living                      Prior Function            PT Goals (current goals can now be found in the care plan section) Acute Rehab PT Goals PT Goal Formulation: With patient/family Time For Goal Achievement: 10/04/18 Potential to Achieve Goals: Fair Progress towards PT goals: Progressing toward goals    Frequency    Min 3X/week      PT Plan Current plan remains appropriate    Co-evaluation PT/OT/SLP Co-Evaluation/Treatment: Yes Reason for Co-Treatment: Complexity of the patient's impairments (multi-system involvement);For patient/therapist safety PT goals addressed during session: Mobility/safety with mobility OT goals addressed during session: Strengthening/ROM;ADL's and self-care      AM-PAC PT "6 Clicks" Mobility   Outcome Measure  Help needed turning from your back to your side while in a flat bed without using bedrails?: Total Help needed moving from lying on your back to sitting on the side of a flat bed without using bedrails?: Total Help needed moving to and from a bed to a chair (including a wheelchair)?: Total Help needed standing up from a chair using your arms (e.g., wheelchair or bedside chair)?: Total Help needed to walk in hospital room?: Total Help needed climbing 3-5 steps with a railing? : Total 6 Click Score: 6    End of Session Equipment Utilized During Treatment: Oxygen;Gait belt Activity Tolerance: Patient limited by fatigue Patient left: in bed;with call bell/phone within reach;with bed alarm set;with SCD's reapplied Nurse Communication: Mobility status;Need for lift equipment PT Visit Diagnosis: Other abnormalities of gait and mobility (R26.89);Muscle weakness (generalized) (M62.81);Other symptoms and signs involving the nervous system (R29.898);Hemiplegia and hemiparesis Hemiplegia - Right/Left: Right Hemiplegia - caused by: Cerebral infarction      Time: 7169-6789 PT Time Calculation (min) (ACUTE ONLY): 24 min  Charges:  $Neuromuscular Re-education: 8-22 mins                     [name], PT DPT  Board Certified Neurologic Specialist Acute Rehabilitation Services Pager [phone] Office [phone]    Fabio Asa 09/23/2018, 10:35 AM

## 2018-09-23 NOTE — Progress Notes (Addendum)
PROGRESS NOTE    Darren Frazier  YIF:[id number] DOB: 04-04-1941 DOA: 09/17/2018 PCP: Lynnea Ferrier, MD    Brief Narrative:  78 year old male who presented with respiratory distress.  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  Patient was discharged to rehab facility, where he was found in severe hypoxic respiratory failure, transferred back to the emergency department where he was diagnosed with acute pulmonary edema and required intubation and invasive mechanical ventilation.   He was extubated 3 days later January 17 and transferred to hospitalist team january 19.   Assessment & Plan:   Active Problems:   * No active hospital problems. *  1. Acute encephalopathy with delirium. This am patient is more interactive and able to respond to questions. Will do a trail off mittens, and continue neuro checks. Continue nutritional support. Fall and aspiration precautions. On seroquel.   2. 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 Persistent right sided weakness and slow speech. Continue medical therapy with antiplatelet therapy and ezetimbide, continue blood pressure and glucose control. Will need snf at discharge, patient is medically stable.   3. Paroxysmal atrial fibrillation. Rate controlled with metoprolol and amiodarone. Continue anticoagulation with apixaban. Patient now off telemetry.   4. T2DM. Glucose cover and monitoring with insulin sliding scale. Poor oral intake.   5. HTN. Metoprolol for blood pressure control.   6. Hypothyroid. On Levothyroxine per home regimen.   7. AKI on CKD stage 3. His renal function is stable with serum cr at 1,21.Will hold on renal panel for now.   DVT  prophylaxis: enoxaparin   Code Status: full Family Communication: I spoke with patient's wife at the bedside and all questions were addressed.  Disposition Plan/ discharge barriers: waiting for placement.  Body mass index is 39.39 kg/m. Malnutrition Type:  Nutrition Problem: Inadequate oral intake Etiology: dysphagia   Malnutrition Characteristics:  Signs/Symptoms: per patient/family report   Nutrition Interventions:  Interventions: Magic cup, MVI, Glucerna shake  RN Pressure Injury Documentation:     Consultants:     Procedures:     Antimicrobials:       Subjective: Patient is more awake and alert, until this am has been on mittens, (hands restrains), no nausea or vomiting.   Objective: Vitals:   09/22/18 1815 09/22/18 1900 09/23/18 0840 09/23/18 1200  BP: 99/68 129/69 120/74   Pulse:  91 86   Resp: 20 (!) 21 20 18   Temp: 98 F (36.7 C) (!) 97.5 F (36.4 C) 97.7 F (36.5 C) 98.8 F (37.1 C)  TempSrc: Axillary Axillary Axillary Oral  SpO2: 96% 94% 99% 96%  Weight:      Height:        Intake/Output Summary (Last 24 hours) at 09/23/2018 1548 Last data filed at 09/22/2018 1800 Gross per 24 hour  Intake 429 ml  Output -  Net 429 ml   Filed Weights   09/19/18 0600 09/20/18 0413 09/22/18 0500  Weight: 122.4 kg 123.4 kg 128.1 kg    Examination:   General: Not in pain or dyspnea, deconditioned  Neurology: Awake and alert, non focal  E ENT: mild pallor, no icterus, oral mucosa moist Cardiovascular: No JVD. S1-S2 present, irregularly irregular, no gallops, rubs, or murmurs. No lower extremity edema. Pulmonary: positive breath sounds  bilaterally, adequate air movement, no wheezing, rhonchi or rales. Gastrointestinal. Abdomen with no organomegaly, non tender, no rebound or guarding Skin. No rashes Musculoskeletal: no joint deformities     Data Reviewed: I have personally reviewed following labs and imaging studies  CBC: Recent Labs  Lab  09/17/18 1938 09/17/18 1946 09/18/18 0734 09/19/18 0431 09/20/18 0402 09/22/18 0433  WBC 12.3*  --  12.9* 10.0 9.8 8.4  NEUTROABS 9.6*  --   --   --   --   --   HGB 12.1* 13.6 12.2* 10.3* 11.0* 10.9*  HCT 40.2 40.0 39.1 33.3* 35.5* 36.0*  MCV 105.8*  --  100.0 100.9* 102.9* 98.9  PLT 358  --  365 313 338 287   Basic Metabolic Panel: Recent Labs  Lab 09/17/18 1938 09/17/18 1946 09/18/18 0241 09/18/18 0734 09/19/18 0431 09/20/18 0402 09/22/18 0433  NA 141 139  --  140 140 143 140  K 4.8 5.2* 4.8 4.2 3.5 3.7 3.8  CL 104 100  --  98 98 99 100  CO2 28  --   --  27 31 33* 31  GLUCOSE 137* 143*  --  115* 159* 125* 148*  BUN 12 18  --  17 26* 23 15  CREATININE 1.14 1.20  --  1.51* 1.68* 1.18 1.21  CALCIUM 8.8*  --   --  9.4 9.0 9.3 9.2  MG  --   --  2.0 1.9 2.0 2.1  --   PHOS  --   --   --  3.0 3.6 3.7  --    GFR: Estimated Creatinine Clearance: 69.7 mL/min (by C-G formula based on SCr of 1.21 mg/dL). Liver Function Tests: Recent Labs  Lab 09/17/18 1938 09/22/18 0433  AST 36 30  ALT 28 22  ALKPHOS 81 82  BILITOT 0.7 0.7  PROT 7.1 6.6  ALBUMIN 2.7* 2.6*   Recent Labs  Lab 09/17/18 1938  LIPASE 49   Recent Labs  Lab 09/22/18 0433  AMMONIA 16   Coagulation Profile: Recent Labs  Lab 09/18/18 0241  INR 1.73   Cardiac Enzymes: No results for input(s): CKTOTAL, CKMB, CKMBINDEX, TROPONINI in the last 168 hours. BNP (last 3 results) No results for input(s): PROBNP in the last 8760 hours. HbA1C: No results for input(s): HGBA1C in the last 72 hours. CBG: Recent Labs  Lab 09/19/18 2325 09/20/18 0405 09/20/18 0733 09/20/18 1112 09/20/18 1641  GLUCAP 108* 102* 111* 120* 141*   Lipid Profile: No results for input(s): CHOL, HDL, LDLCALC, TRIG, CHOLHDL, LDLDIRECT in the last 72 hours. Thyroid Function Tests: No results for input(s): TSH, T4TOTAL, FREET4, T3FREE, THYROIDAB in the last 72 hours. Anemia Panel: Recent Labs    09/22/18 0433  VITAMINB12 566    FOLATE 18.7      Radiology Studies: I have reviewed all of the imaging during this hospital visit personally     Scheduled Meds: . amiodarone  200 mg Oral Daily  . apixaban  5 mg Oral BID  . Chlorhexidine Gluconate Cloth  6 each Topical Daily  . cholecalciferol  1,000 Units Oral Daily  . ezetimibe  10 mg Oral Daily  . feeding supplement (GLUCERNA SHAKE)  237 mL Oral TID BM  . levothyroxine  100 mcg Oral QAC breakfast  . metoprolol tartrate  50 mg Oral BID  . multivitamin with minerals  1 tablet Oral Daily  . QUEtiapine  75 mg Oral QHS  . sodium chloride flush  10-40 mL Intracatheter Q12H  .  vitamin C  500 mg Oral Daily   Continuous Infusions:   LOS: 6 days        Mauricio Annett Gula, MD [facility] Pager [phone]

## 2018-09-23 NOTE — Progress Notes (Signed)
PROGRESS NOTE        PATIENT DETAILS Name: Darren Frazier Age: 78 y.o. Sex: male Date of Birth: 15-May-1941 Admit Date: 09/17/2018 Admitting Physician Carin HockKristen D Scatliffe, MD VWU:[id number]:Klein, Susanne BordersBert J III, MD  Brief Narrative: Patient is a 78 y.o. male with hx of type 2 DM, HTN, HLP, Hypothyroidism, Diastolic CHF, and chronic atrial fibrillation that suffered a stroke on 08/27/2018. He was hospitalized for said stroke until 09/17/2018 at which time he was discharged to a stroke rehab unit. Upon arriving to the rehab unit however, he was found to be in respiratory failure and was sent back to the ED.   The patient was intubated in the ED. CXR showed pulmonary edema, suggestive of acute on chronic CHF. Patient was diuresed. He was extubated on 09/19/2018. Since this time, he has been showing signs of acute metabolic encephalopathy, which has complicated the baseline confusion caused by the stroke last December.     Subjective: Patient is alert and oriented to time and place. He exhibits difficulty in answering questions, but with enough time is able to respond.  He furthermore reports:  No chest pain No SOB No headache  Assessment/Plan: Active Problems:   * No active hospital problems. *  Acute metabolic encephalopathy with delirium. With multiple promptings, the patient is able to demonstrate attention, but not organized thinking.  Mittens are still in place as he unfortunately continues to attempt pulling his lines. We will plan to continue Seroquel and encourage family/staff to help keep awake during the day and to sleep at night.    Chronic Atrial Fibrillation: Rate controlled. Continue apixaban, amiodarone, and metoprolol.   Hypothyroidism: continue levothyroxine  Hyperlipidemia: continue ezetimibe  Hypertension: BP controlled, continue metoprolol  Morning labs/Imaging ordered: None  DVT Prophylaxis: SCDs and Apixaban  Code Status: Full  Family  Communication: None  Disposition Plan: Remain inpatient with the goal of discharging to a stroke rehab unit once he is able to consistently abstain from restraint placement.    Antimicrobial agents: Anti-infectives (From admission, onward)   Start     Dose/Rate Route Frequency Ordered Stop   09/18/18 2300  vancomycin (VANCOCIN) 1,750 mg in sodium chloride 0.9 % 500 mL IVPB  Status:  Discontinued     1,750 mg 250 mL/hr over 120 Minutes Intravenous Every 24 hours 09/17/18 2034 09/18/18 0816   09/18/18 1000  ceFEPIme (MAXIPIME) 2 g in sodium chloride 0.9 % 100 mL IVPB  Status:  Discontinued     2 g 200 mL/hr over 30 Minutes Intravenous Every 12 hours 09/18/18 0726 09/18/18 0816   09/17/18 2045  vancomycin (VANCOCIN) 2,500 mg in sodium chloride 0.9 % 500 mL IVPB     2,500 mg 250 mL/hr over 120 Minutes Intravenous  Once 09/17/18 2034 09/17/18 2319   09/17/18 2030  ceFEPIme (MAXIPIME) 2 g in sodium chloride 0.9 % 100 mL IVPB     2 g 200 mL/hr over 30 Minutes Intravenous  Once 09/17/18 2021 09/17/18 2124      MEDICATIONS: Scheduled Meds: . amiodarone  200 mg Oral Daily  . apixaban  5 mg Oral BID  . Chlorhexidine Gluconate Cloth  6 each Topical Daily  . cholecalciferol  1,000 Units Oral Daily  . ezetimibe  10 mg Oral Daily  . feeding supplement (GLUCERNA SHAKE)  237 mL Oral TID BM  . levothyroxine  100 mcg Oral QAC breakfast  . metoprolol tartrate  50 mg Oral BID  . multivitamin with minerals  1 tablet Oral Daily  . QUEtiapine  75 mg Oral QHS  . sodium chloride flush  10-40 mL Intracatheter Q12H  . vitamin C  500 mg Oral Daily   Continuous Infusions: PRN Meds:.bisacodyl, docusate, fluticasone, levalbuterol, senna-docusate, sodium chloride flush   PHYSICAL EXAM: Vital signs: Vitals:   09/22/18 0747 09/22/18 1143 09/22/18 1815 09/22/18 1900  BP: 140/74 120/81 99/68 129/69  Pulse:  91  91  Resp:  13 20 (!) 21  Temp: (!) 97.3 F (36.3 C) 98.1 F (36.7 C) 98 F (36.7 C) (!)  97.5 F (36.4 C)  TempSrc: Oral Oral Axillary Axillary  SpO2: 97% 95% 96% 94%  Weight:      Height:       Filed Weights   09/19/18 0600 09/20/18 0413 09/22/18 0500  Weight: 122.4 kg 123.4 kg 128.1 kg   Body mass index is 39.39 kg/m.   General appearance :Awake, alert, not in any distress. Speech slightly mumbled.   Eyes:, pupils equally reactive to light and accomodation,no scleral icterus.Pink conjunctiva HEENT: Atraumatic and Normocephalic Resp:Good air entry bilaterally, no added sounds  CVS: HR slightly chaotic, synonymous with atrial fibrillation GI: Bowel sounds present, Non tender and not distended with no gaurding, rigidity or rebound.No organomegaly Extremities: B/L Lower Ext shows no edema, both legs are warm to touch. No digital cyanosis. Neurology:  Speech slightly mumbled but understandable. Presents with right sided weakness in both limbs Psychiatric: Alert, oriented to time and place after multiple promptings.  I have personally reviewed following labs and imaging studies  LABORATORY DATA: CBC: Recent Labs  Lab 09/17/18 1938 09/17/18 1946 09/18/18 0734 09/19/18 0431 09/20/18 0402 09/22/18 0433  WBC 12.3*  --  12.9* 10.0 9.8 8.4  NEUTROABS 9.6*  --   --   --   --   --   HGB 12.1* 13.6 12.2* 10.3* 11.0* 10.9*  HCT 40.2 40.0 39.1 33.3* 35.5* 36.0*  MCV 105.8*  --  100.0 100.9* 102.9* 98.9  PLT 358  --  365 313 338 287    Basic Metabolic Panel: Recent Labs  Lab 09/17/18 1938 09/17/18 1946 09/18/18 0241 09/18/18 0734 09/19/18 0431 09/20/18 0402 09/22/18 0433  NA 141 139  --  140 140 143 140  K 4.8 5.2* 4.8 4.2 3.5 3.7 3.8  CL 104 100  --  98 98 99 100  CO2 28  --   --  27 31 33* 31  GLUCOSE 137* 143*  --  115* 159* 125* 148*  BUN 12 18  --  17 26* 23 15  CREATININE 1.14 1.20  --  1.51* 1.68* 1.18 1.21  CALCIUM 8.8*  --   --  9.4 9.0 9.3 9.2  MG  --   --  2.0 1.9 2.0 2.1  --   PHOS  --   --   --  3.0 3.6 3.7  --     GFR: Estimated Creatinine  Clearance: 69.7 mL/min (by C-G formula based on SCr of 1.21 mg/dL).  Liver Function Tests: Recent Labs  Lab 09/17/18 1938 09/22/18 0433  AST 36 30  ALT 28 22  ALKPHOS 81 82  BILITOT 0.7 0.7  PROT 7.1 6.6  ALBUMIN 2.7* 2.6*   Recent Labs  Lab 09/17/18 1938  LIPASE 49   Recent Labs  Lab 09/22/18 0433  AMMONIA 16    Coagulation Profile: Recent Labs  Lab 09/18/18 0241  INR 1.73    CBG: Recent Labs  Lab 09/19/18 2325 09/20/18 0405 09/20/18 0733 09/20/18 1112 09/20/18 1641  GLUCAP 108* 102* 111* 120* 141*    Anemia Panel: Recent Labs    09/22/18 0433  VITAMINB12 566  FOLATE 18.7    Urine analysis:    Component Value Date/Time   COLORURINE AMBER (A) 09/17/2018 2019   APPEARANCEUR CLOUDY (A) 09/17/2018 2019   APPEARANCEUR Clear 03/07/2013 1849   LABSPEC 1.025 09/17/2018 2019   LABSPEC 1.011 03/07/2013 1849   PHURINE 5.0 09/17/2018 2019   GLUCOSEU NEGATIVE 09/17/2018 2019   GLUCOSEU Negative 03/07/2013 1849   HGBUR LARGE (A) 09/17/2018 2019   BILIRUBINUR NEGATIVE 09/17/2018 2019   BILIRUBINUR Negative 03/07/2013 1849   KETONESUR NEGATIVE 09/17/2018 2019   PROTEINUR 100 (A) 09/17/2018 2019   NITRITE NEGATIVE 09/17/2018 2019   LEUKOCYTESUR LARGE (A) 09/17/2018 2019   LEUKOCYTESUR 2+ 03/07/2013 1849    Sepsis Labs: Lactic Acid, Venous    Component Value Date/Time   LATICACIDVEN 2.3 (HH) 09/18/2018 0241    MICROBIOLOGY: Recent Results (from the past 240 hour(s))  Urine Culture     Status: None   Collection Time: 09/15/18  4:25 PM  Result Value Ref Range Status   Specimen Description URINE, CLEAN CATCH  Final   Special Requests   Final    NONE Performed at [facility], [address], [place], [place] [address]    Culture   Final    Multiple bacterial morphotypes present, none predominant. Suggest appropriate recollection if clinically indicated.   Report Status 09/16/2018 FINAL  Final  Urine culture     Status: Abnormal    Collection Time: 09/17/18  8:19 PM  Result Value Ref Range Status   Specimen Description URINE, CATHETERIZED  Final   Special Requests NONE  Final   Culture (A)  Final    <10,000 COLONIES/mL INSIGNIFICANT GROWTH Performed at [facility], [address] [place], [place] [address]    Report Status 09/19/2018 FINAL  Final  Culture, blood (routine x 2)     Status: None   Collection Time: 09/17/18  8:36 PM  Result Value Ref Range Status   Specimen Description BLOOD BLOOD LEFT FOREARM  Final   Special Requests   Final    BOTTLES DRAWN AEROBIC AND ANAEROBIC Blood Culture results may not be optimal due to an inadequate volume of blood received in culture bottles   Culture   Final    NO GROWTH 5 DAYS Performed at [facility], [address], [place], [place] [address]    Report Status 09/22/2018 FINAL  Final  Culture, blood (routine x 2)     Status: None (Preliminary result)   Collection Time: 09/18/18  2:39 AM  Result Value Ref Range Status   Specimen Description BLOOD LEFT FOOT  Final   Special Requests   Final    BOTTLES DRAWN AEROBIC ONLY Blood Culture results may not be optimal due to an inadequate volume of blood received in culture bottles   Culture   Final    NO GROWTH 4 DAYS Performed at [facility], [address], [place], [place] [address]    Report Status PENDING  Incomplete    RADIOLOGY STUDIES/RESULTS: Ct Angio Head W Or Wo Contrast  Result Date: 09/11/2018 CLINICAL DATA:  Initial evaluation for head trauma, ataxia, history of stroke. EXAM: CT ANGIOGRAPHY HEAD TECHNIQUE: Multidetector CT imaging of the head was  performed using the standard protocol during bolus administration of intravenous contrast. Multiplanar CT image reconstructions and MIPs were obtained to evaluate the vascular anatomy. CONTRAST:  75mL ISOVUE-370 IOPAMIDOL (ISOVUE-370) INJECTION 76% COMPARISON:  Prior MRI from 08/28/2018 and CTA from 08/27/2018. FINDINGS: CT HEAD Brain: Moderately  advanced cerebral atrophy with chronic small vessel ischemic disease. Multiple chronic cortical infarcts involving the bilateral cerebral hemispheres again seen, stable. Remote lacunar infarct present at the left basal ganglia. Recently identified acute left ACA/MCA territory infarcts grossly stable, better evaluated on recent MRI. No evidence for hemorrhagic transformation or other complication. No new intracranial hemorrhage. No other new acute large vessel territory infarct. No mass lesion, midline shift or mass effect. No hydrocephalus. No extra-axial fluid collection. Vascular: No hyperdense vessel. Extensive calcified atherosclerosis at the skull base. Skull: Scalp soft tissues and calvarium within normal limits. Sinuses: Few small retention cyst noted within the sphenoid sinuses and left maxillary sinus. Paranasal sinuses are otherwise clear. No significant mastoid effusion. Orbits: Globes and orbital soft tissues demonstrate no acute finding. 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 Previously identified stenosis about the right ICA bifurcation partially visualized. Right ICA otherwise patent to the skull base. Moderate to advanced atheromatous plaque throughout the right carotid siphon with estimated stenosis of approximately 50-70%, stable. Distal reconstitution at the supraclinoid left ICA via collateral flow cross the circle-of-Willis. Short-segment severe stenosis of approximately 80% at the supraclinoid left ICA again noted. A1 segments remain patent, with hypoplastic left A1. Patent and normal anterior communicating artery. Anterior cerebral arteries widely patent distally without stenosis or occlusion. Multifocal atheromatous irregularity seen throughout the M1 segments bilaterally, with associated stenosis of the mid left M1 of up to approximately 50-70%. Mild to moderate multifocal narrowing at the right M1 of approximately 30-50%, stable. 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 Retrograde flow within the distal right V4 segment across the vertebrobasilar junction with opacification of the right PICA. Left vertebral artery remains patent as it crosses into the cranial vault. Multifocal atheromatous irregularity with short-segment severe approximate 80-90% stenosis at the distal left V4 segment just distal to the takeoff of the left PICA, stable. Left PICA is perfused. Basilar mildly tortuous and irregular but is patent to its distal aspect without high-grade stenosis. Superior cerebral arteries patent bilaterally. Left vertebral artery primarily supplied via the basilar. Hypoplastic right P1 with prominent right posterior communicating artery. PCAs demonstrate multifocal atheromatous irregularity but are patent to their distal aspects without high-grade stenosis. Venous sinuses: Grossly patent, although not well assessed due to arterial timing of the contrast bolus. Anatomic variants: None significant. Delayed phase: No abnormal enhancement. IMPRESSION: CT HEAD IMPRESSION 1. Continued normal expected interval evolution of previously identified small volume left cerebral infarcts, grossly stable in size and distribution from previous exams. No evidence for hemorrhagic transformation or other complication. 2. No other new acute intracranial abnormality. 3. Underlying advanced atrophy with chronic small vessel ischemic disease with multifocal chronic ischemic infarcts, stable. CTA HEAD IMPRESSION 1. Stable CTA of the head as compared to 08/27/2018. 2. Occluded left internal carotid artery, with distal reconstitution at the supraclinoid segment. Approximate 80% left supraclinoid stenosis unchanged. 3. Severe atherosclerotic disease of both carotid siphons, with approximately 50-70% stenosis on the right. 4. 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  Retrograde flow within the distal right V4 segment with perfusion of the right PICA. 5. Severe 80-90%  stenosis at the distal left V4 segment, stable. 6. Extensive atheromatous irregularity throughout the remainder of the intracranial circulation, relatively stable from previous exam. No new large vessel occlusion or other significant finding. Electronically Signed   By: Rise Mu M.D.   On: 09/11/2018 00:44   Ct Angio Head W Or Wo Contrast  Result Date: 08/27/2018 CLINICAL DATA:  Acute presentation with speech disturbance. EXAM: CT ANGIOGRAPHY HEAD AND NECK TECHNIQUE: Multidetector CT imaging of the head and neck was performed using the standard protocol during bolus administration of intravenous contrast. Multiplanar CT image reconstructions and MIPs were obtained to evaluate the vascular anatomy. Carotid stenosis measurements (when applicable) are obtained utilizing NASCET criteria, using the distal internal carotid diameter as the denominator. CONTRAST:  75mL ISOVUE-370 IOPAMIDOL (ISOVUE-370) INJECTION 76% COMPARISON:  CT same day. FINDINGS: CTA NECK FINDINGS Aortic arch: Aortic atherosclerosis.  No aneurysm or dissection. Right carotid system: Common carotid artery shows some atherosclerotic plaque but is widely patent to the bifurcation. There is soft and calcified plaque affecting the carotid bifurcation and ICA bulb region. There is severe stenosis in the distal bulb region with luminal diameter of 1 mm. The vessel is quite tortuous beyond that but widely patent to the upper cervical region, where there is soft and calcified plaque resulting in minimal diameter of 3.5 mm. Compared to an expected diameter of 5 mm, stenosis in the bulb is 80% or greater. Stenosis in the upper cervical region is 30%. Left carotid system: Common carotid artery shows atherosclerotic plaque but is sufficiently patent to the bifurcation region. There is calcified and soft plaque at the carotid bifurcation and ICA  bulb. There is left internal carotid artery occlusion at the distal bulb. 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 The left vertebral artery shows 50% stenosis at its origin but is sufficiently patent beyond that through the cervical region to the foramen magnum. Skeleton: Mid cervical spondylosis. Other neck: No mass or lymphadenopathy. Upper chest: Interstitial prominence which could go along with fluid overload or mild edema. Review of the MIP images confirms the above findings CTA HEAD FINDINGS Anterior circulation: Left internal carotid artery is occluded without antegrade flow through the skull base. Right internal carotid artery shows atherosclerotic disease in the carotid siphon region with stenosis estimated at 50-70%. The anterior and middle cerebral vessels are patent. There is atherosclerotic irregularity of the M1 segment with stenosis of 50-70%. No acute vessel occlusion is identified. There is a chronic punctate calcified embolus in 1 of the insular branches, but this was present in 2014. On the left, there is reconstituted flow in the distal siphon. Severe stenosis of the supraclinoid ICA, 80% or greater. Flow is present in the anterior and middle cerebral vessels, secondary to reconstituted flow and flow through communicating arteries. There is 50% stenosis in the M1 segment. I do not see any occluded or missing large or medium vessels in the MCA territory. Posterior circulation: Right vertebral artery shows no antegrade flow at the foramen magnum. There is retrograde flow in the distal right vertebral. Left vertebral artery is patent at the foramen magnum. There is atherosclerotic disease of the V4 segment with stenosis estimated at 70%. There are serial stenoses. The vessel does show flow to the basilar, which shows atherosclerotic irregularity but is patent. Flow is present in the superior cerebellar and posterior cerebral arteries. Venous sinuses: Patent and  normal. Anatomic variants: None significant. Delayed phase: No abnormal enhancement. 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 Severe irregular atherosclerotic disease of the right carotid bifurcation with stenosis of 80% in the bulb region and 30% in the distal ICA. 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 50% stenosis of the left vertebral artery origin. Severe atherosclerotic disease in both carotid siphon regions. Right siphon stenosis estimated at 50-70%. Reconstituted flow in the distal left siphon and supraclinoid region. 80% supraclinoid stenosis. Flow in both anterior and middle cerebral artery territories. Stenosis of both M1 segments estimated at 50-70%. I do not identify any large or medium vessel occlusion acutely within the MCA branch vessels. Severely disease left V4 segment with serial stenoses proximal to the basilar estimated at 70%. Basilar atherosclerotic irregularity without flow limiting stenosis. Posterior circulation branch vessels do show flow, including right PICA which is supplied by retrograde flow in the distal right vertebral artery. Electronically Signed   By: Paulina Fusi M.D.   On: 08/27/2018 13:14   Dg Chest 2 View  Result Date: 08/27/2018 CLINICAL DATA:  Chest pain and shortness of breath.  Cardiomyopathy. EXAM: CHEST - 2 VIEW COMPARISON:  03/07/2013 FINDINGS: Mild cardiomegaly. Aortic atherosclerosis. Diffuse interstitial infiltrates, consistent with pulmonary edema. Mild subsegmental atelectasis also seen in the left upper lobe. No evidence of pulmonary consolidation or significant pleural effusion. IMPRESSION: Mild cardiomegaly and diffuse interstitial edema, consistent with congestive heart failure. Electronically Signed   By: Myles Rosenthal M.D.   On: 08/27/2018 12:02   Ct Head Wo Contrast  Result Date: 09/17/2018 CLINICAL DATA:  78 year old male presents unresponsive after being  discharged earlier today. Recent history of stroke. EXAM: CT HEAD WITHOUT CONTRAST TECHNIQUE: Contiguous axial images were obtained from the base of the skull through the vertex without intravenous contrast. COMPARISON:  CTA head 09/10/2018; brain MRI 08/28/2018 FINDINGS: Brain: No new acute infarct identified. Continued evolution of a previously noted multifocal cortical infarcts without significant interval change or progression compared to 09/10/2018. Periventricular white matter hypoattenuation consistent with chronic microvascular ischemic white matter disease. Stable remote lacunar infarct of the left caudate head. Vascular: Atherosclerotic calcifications in the bilateral cavernous and supraclinoid internal carotid arteries. No hyperdense vascular sign. Skull: Normal. Negative for fracture or focal lesion. Sinuses/Orbits: No acute finding. Other: None. IMPRESSION: 1. No new acute intracranial abnormality. 2. Expected continued evolution of a multifocal bilateral cortical infarcts. 3. Chronic microvascular ischemic white matter disease. Electronically Signed   By: Malachy Moan M.D.   On: 09/17/2018 20:25   Ct Angio Neck W Or Wo Contrast  Result Date: 08/27/2018 CLINICAL DATA:  Acute presentation with speech disturbance. EXAM: CT ANGIOGRAPHY HEAD AND NECK TECHNIQUE: Multidetector CT imaging of the head and neck was performed using the standard protocol during bolus administration of intravenous contrast. Multiplanar CT image reconstructions and MIPs were obtained to evaluate the vascular anatomy. Carotid stenosis measurements (when applicable) are obtained utilizing NASCET criteria, using the distal internal carotid diameter as the denominator. CONTRAST:  75mL ISOVUE-370 IOPAMIDOL (ISOVUE-370) INJECTION 76% COMPARISON:  CT same day. FINDINGS: CTA NECK FINDINGS Aortic arch: Aortic atherosclerosis.  No aneurysm or dissection. Right carotid system: Common carotid artery shows some atherosclerotic plaque  but is widely patent to the bifurcation. There is soft and calcified plaque affecting the carotid bifurcation and ICA bulb region. There is severe stenosis in the distal bulb region with luminal diameter of 1 mm. The vessel is quite tortuous beyond that but widely patent to the upper cervical region, where there is soft  and calcified plaque resulting in minimal diameter of 3.5 mm. Compared to an expected diameter of 5 mm, stenosis in the bulb is 80% or greater. Stenosis in the upper cervical region is 30%. Left carotid system: Common carotid artery shows atherosclerotic plaque but is sufficiently patent to the bifurcation region. There is calcified and soft plaque at the carotid bifurcation and ICA bulb. There is left internal carotid artery occlusion at the distal bulb. 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 The left vertebral artery shows 50% stenosis at its origin but is sufficiently patent beyond that through the cervical region to the foramen magnum. Skeleton: Mid cervical spondylosis. Other neck: No mass or lymphadenopathy. Upper chest: Interstitial prominence which could go along with fluid overload or mild edema. Review of the MIP images confirms the above findings CTA HEAD FINDINGS Anterior circulation: Left internal carotid artery is occluded without antegrade flow through the skull base. Right internal carotid artery shows atherosclerotic disease in the carotid siphon region with stenosis estimated at 50-70%. The anterior and middle cerebral vessels are patent. There is atherosclerotic irregularity of the M1 segment with stenosis of 50-70%. No acute vessel occlusion is identified. There is a chronic punctate calcified embolus in 1 of the insular branches, but this was present in 2014. On the left, there is reconstituted flow in the distal siphon. Severe stenosis of the supraclinoid ICA, 80% or greater. Flow is present in the anterior and middle cerebral vessels,  secondary to reconstituted flow and flow through communicating arteries. There is 50% stenosis in the M1 segment. I do not see any occluded or missing large or medium vessels in the MCA territory. Posterior circulation: Right vertebral artery shows no antegrade flow at the foramen magnum. There is retrograde flow in the distal right vertebral. Left vertebral artery is patent at the foramen magnum. There is atherosclerotic disease of the V4 segment with stenosis estimated at 70%. There are serial stenoses. The vessel does show flow to the basilar, which shows atherosclerotic irregularity but is patent. Flow is present in the superior cerebellar and posterior cerebral arteries. Venous sinuses: Patent and normal. Anatomic variants: None significant. Delayed phase: No abnormal enhancement. 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 Severe irregular atherosclerotic disease of the right carotid bifurcation with stenosis of 80% in the bulb region and 30% in the distal ICA. 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 50% stenosis of the left vertebral artery origin. Severe atherosclerotic disease in both carotid siphon regions. Right siphon stenosis estimated at 50-70%. Reconstituted flow in the distal left siphon and supraclinoid region. 80% supraclinoid stenosis. Flow in both anterior and middle cerebral artery territories. Stenosis of both M1 segments estimated at 50-70%. I do not identify any large or medium vessel occlusion acutely within the MCA branch vessels. Severely disease left V4 segment with serial stenoses proximal to the basilar estimated at 70%. Basilar atherosclerotic irregularity without flow limiting stenosis. Posterior circulation branch vessels do show flow, including right PICA which is supplied by retrograde flow in the distal right vertebral artery. Electronically Signed   By: Paulina Fusi M.D.   On: 08/27/2018  13:14   Mr Brain Wo Contrast  Result Date: 08/28/2018 CLINICAL DATA:  Follow-up examination for acute stroke, right lower extremity weakness. Known left ICA occlusion. EXAM: MRI HEAD WITHOUT CONTRAST TECHNIQUE: Multiplanar, multiecho pulse sequences of the brain and surrounding structures were obtained without intravenous contrast. COMPARISON:  Prior CTs from 08/27/2018. FINDINGS: Brain: Generalized age-related cerebral atrophy. Patchy and confluent T2/FLAIR hyperintensity within the periventricular and deep white matter both cerebral hemispheres most consistent with chronic microvascular ischemic disease, moderate nature. Scatter remote cortical infarcts involving the posterior left frontoparietal region, right parietal lobe, and right frontal lobe are seen. Small remote right cerebellar infarct. Remote lacunar infarct present at the left caudate head. Scattered chronic hemosiderin staining seen about several of these infarcts. Scattered multifocal foci of restricted diffusion involving the cortical subcortical left frontal, parietal, and temporal occipital regions, compatible with acute ischemic left MCA territory infarcts. Largest area of infarction seen at the high left frontal parietal region and measures 2.5 cm (series 3, image 48). Possible faint petechial hemorrhage about a few of these infarcts without frank hemorrhagic transformation or significant mass effect. Findings likely embolic in nature. No mass lesion, midline shift or mass effect. Mild diffuse ventricular prominence related global parenchymal volume loss without hydrocephalus. No extra-axial fluid collection. Pituitary gland within normal limits. Vascular: Abnormal flow void within the left ICA to its cavernous segment, compatible with previously identified left ICA occlusion. Major intracranial vascular flow voids otherwise maintained at the skull base. Skull and upper cervical spine: Craniocervical junction within normal limits. Upper  cervical spine normal. Bone marrow signal intensity within normal limits. No scalp soft tissue abnormality. Sinuses/Orbits: Patient status post bilateral ocular lens replacement. Globes and orbital soft tissues demonstrate no acute finding. Scattered mucosal thickening seen throughout the paranasal sinuses with superimposed scattered air-fluid levels. Fluid seen within the nasopharynx. Patient likely intubated. Trace bilateral mastoid effusions noted. Inner ear structures normal. Other: None. IMPRESSION: 1. 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 Possible faint scattered associated petechial hemorrhage without frank hemorrhagic transformation or significant mass effect. 2. Abnormal flow void within the left ICA to its cavernous segment, consistent with known left ICA occlusion. 3. 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 4. Moderately advanced cerebral atrophy with chronic small vessel ischemic disease. Electronically Signed   By: Rise Mu M.D.   On: 08/28/2018 15:33   Ct Abdomen Pelvis W Contrast  Result Date: 08/29/2018 CLINICAL DATA:  Abdominal distention.  Bloating. EXAM: CT ABDOMEN AND PELVIS WITH CONTRAST TECHNIQUE: Multidetector CT imaging of the abdomen and pelvis was performed using the standard protocol following bolus administration of intravenous contrast. CONTRAST:  OMNIPAQUE IOHEXOL 300 MG/ML  SOLN COMPARISON:  Abdominal radiograph dated 08/28/2018 and abdominal ultrasound dated 02/28/2016 FINDINGS: Lower chest: There are bilateral consolidative infiltrates in the lower lobes as well as minimal bilateral pleural effusions. Aortic atherosclerosis. Coronary artery calcifications. Hepatobiliary: Liver parenchyma is normal. No biliary ductal dilatation. Vicarious excretion of contrast in the normal appearing gallbladder. Pancreas: Unremarkable.  No pancreatic ductal dilatation or surrounding inflammatory changes. Spleen: Normal in size without focal abnormality. Adrenals/Urinary Tract: Adrenal glands are normal. Normal right kidney. Large chronic cyst in the lower pole of the left kidney with some calcification in the wall. Maximum diameter of the cyst is approximately 9 cm. Stomach/Bowel: NG tube tip in the body of the stomach. There are few diverticula in the proximal sigmoid portion of the colon. There is moderate air in the colon without significant distention. Small bowel appears normal. Fluid and air in the stomach. Appendix has been removed. Vascular/Lymphatic: There is a poorly defined hematoma in the right inguinal region in the panniculus. This measures approximately 12 x 5 x 2 cm and is  probably related to recent angiography. There are several small lymph nodes in the right inguinal region, the largest being 15 mm in diameter. Extensive aortic atherosclerosis. No adenopathy. Reproductive: Dense calcification in the normal-sized prostate gland. Other: There is a small amount of ascites in the abdomen, nonspecific. Small periumbilical hernia containing only fat. Edema in the subcutaneous fat of both flanks, right greater than left. Slight edema in the panniculus and in the lateral aspect of both thighs. Musculoskeletal: No acute abnormalities. Multilevel degenerative disc disease in the thoracic spine and lumbar spine. IMPRESSION: 1. Small hematoma in the right inguinal region in the panniculus, probably secondary to the recent angiography. 2. Bilateral consolidative infiltrates in the posterior aspect of both lower lobes with tiny adjacent pleural effusions. 3. Small amount of nonspecific ascites. 4. Slightly prominent gas in the bowel without evidence of bowel obstruction. This probably represents an ileus. Electronically Signed   By: Francene Boyers M.D.   On: 08/29/2018 17:03   Ir Ct Head Ltd  Result Date: 08/31/2018 CLINICAL DATA:  New  onset right-sided weakness with mild word-finding difficulties. 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 EXAM: IR ANGIO INTRA EXTRACRAN SEL COM CAROTID INNOMINATE UNI RIGHT MOD SED; IR PERCUTANEOUS ART THORMBECTOMY/INFUSION INTRACRANIAL INCLUDE DIAG ANGIO; IR ANGIO VERTEBRAL SEL SUBCLAVIAN INNOMINATE UNI LEFT MOD SED; IR CT HEAD LIMITED COMPARISON:  CT angiogram of the head and neck of 08/27/2018 MEDICATIONS: Heparin 3,000 units IV; . Ancef 2 g IV antibiotic was administered within 1 hour of the procedure ANESTHESIA/SEDATION: General anesthesia CONTRAST:  Isovue 300 approximately 100 mL FLUOROSCOPY TIME:  Fluoroscopy Time: 60 minutes 0 seconds (4327 mGy). COMPLICATIONS: None immediate. TECHNIQUE: Informed written consent was obtained from the patient after a thorough discussion of the procedural risks, benefits and alternatives. All questions were addressed. Maximal Sterile Barrier Technique was utilized including caps, mask, sterile gowns, sterile gloves, sterile drape, hand hygiene and skin antiseptic. A timeout was performed prior to the initiation of the procedure. The right groin was prepped and draped in the usual sterile fashion. Thereafter using modified Seldinger technique, transfemoral access into the right common femoral artery was obtained without difficulty. Over a 0.035 inch guidewire, a 5 French Pinnacle sheath was inserted. Through this, and also over 0.035 inch guidewire, a 5 [place] JB 1 catheter was advanced to the aortic arch region and selectively positioned in the right common carotid artery, the innominate artery , the left common carotid artery and the left vertebral artery. FINDINGS: The left common carotid artery tear g demonstrates a moderate tortuosity of the proximal left common carotid artery The left external carotid artery  proximally has a mild to moderate stenosis. Is branches are otherwise normally opacified The left internal carotid artery at the bulb would demonstrates the complete angiographic occlusion with no evidence of a string sign on the delayed arterial images. The innominate artery arteriogram demonstrates patency of the right subclavian artery and the right common carotid artery proximally There is no angiographic evidence of the right vertebral artery The right common carotid bifurcation demonstrates the right external carotid artery and its major branches to be widely patent The right internal carotid artery just distal to the bulb has a severe 90% plus stenosis. Distal to this there is a U-shaped configuration of the right internal carotid artery. More distally the vessel is normal caliber  The petrous the cavernous segments and supraclinoid segments are widely patent There is mild atherosclerotic irregularity involving the proximal cavernous, and distal cavernous portions of the right internal carotid artery. Right posterior communicating artery is seen opacifying the right posterior cerebral artery and the distal basilar artery. The right middle cerebral artery has approximately 30 40% stenosis of the right M1 segment The trifurcation branches appear to be widely patent into the capillary and venous phases The right anterior cerebral artery A1 segment and distally demonstrate wide patency is into the capillary and venous phases There is simultaneous cross-filling via the anterior communicating artery of the left anterior cerebral artery A2 segment and distally. Also demonstrated is opacification of the left anterior cerebral A1 segment, with flow noted into the left middle cerebral artery distribution. Unopacified blood is seen in the left middle cerebral artery from the collateral circulation. The left subclavian arteriogram demonstrates mild stenosis at the origin of the right subclavian artery Mild atherosclerotic  disease is noted of the subclavian artery and in the region of the thyrocervical trunk The dominant left vertebral artery origin is widely patent The vessel demonstrates mild tortuosity just distal to this. More distally the vessel is seen to opacify to the cranial skull base There is severe focal of focal stenosis of the left vertebrobasilar junction just distal to the origin of the left posterior-inferior cerebellar artery Additionally there is a approximately 70% stenosis of the proximal basilar artery More distally the basilar artery demonstrates mild caliber irregularity. There is a opacification of the left posterior cerebral artery the superior cerebellar arteries left greater than right, and the anterior-inferior cerebellar arteries Retrograde opacification of the right vertebrobasilar junction to the level of the right posterior-inferior cerebellar arteries is noted. Caliber irregularity of the right posterior-inferior cerebellar artery suggests intracranial arteriosclerosis. Left internal carotid artery occlusion or revascularization attempt The diagnostic JB 1 catheter left common carotid artery was then exchanged over a 0.035 inch 300 cm a 7 French Pinnacle sheath in the right groin. A diagnostic JB 1 catheter were then advanced over the rise exchange guidewire to the descending thoracic aorta. The 7 French Pinnacle sheath was then exchanged over a 0.035 inch 100 cm Amplatz stiff guidewire for a a 8 French 55 cm Brite tip neurovascular sheath using biplane roadmap technique and constant fluoroscopic guidance. Good good aspiration was obtained from the side port of the neurovascular sheath. This was then connected to continuous heparinized saline infusion. The embolus guidewire was then removed. Through the 8 French neurovascular sheath, over a 0.035 inch roadrunner guidewire, a 5 5 [place] JB 1 catheter was advanced to the recover region and select position in the left common carotid artery. Using biplane  technique and constant fluoroscopic guidance, over the of the 5 inch roadrunner guidewire, the 5 [place] JB 1 catheter was advanced to the left external carotid artery and exchanged over a 0.035 inch 300 cm rise exchange guidewire for an 8 French 85 cm flow gap balloon guide catheter which had been prepped with 50% contrast and 50% heparinized saline infusion The guidewire was removed. Good aspiration obtained from the hub of the 8 [place] Flo guide catheter. Gentle contrast injection demonstrated no evidence of spasms dissections or of intraluminal filling defects. At this time, in a coaxial manner and with constant heparinized saline infusion, a tree approached 1 microcatheter was advanced over a point 0.014 inch soft tip synchro micro guidewire to the distal end of the Flo guide catheter The Flood guide catheter was advanced  to just inside the left internal carotid artery bulb. Thereafter, multiple attempts were made to advance the microcatheter over the micro guidewire first with Ace synchro soft and then a regular synchro guidewire. In 016 headliner double angled micro guidewire was also utilized in order to access the occluded left internal carotid artery. It was noted that there was a severe tortuosity at the junction of the proximal and the middle 1/3 of the left internal carotid artery Following multiple attempts it was decided to stop. A control was then performed through the [facility] guide catheter in the left common carotid artery which revealed brisk cross-filling of the left internal carotid artery distal cavernous and supraclinoid segments from the left external carotid artery branches via the ipsilateral ophthalmic artery Flow is noted into the left middle cerebral artery as well Also noted was a retrograde opacification from the left posterior communicating artery into the supraclinoid left ICA and then middle cerebral artery An earlier diagnostic catheter arteriogram of the right common carotid artery he  had revealed partial cross-filling via the anterior communicating artery from the right internal carotid artery Given the the collateral circulation, it was decided to stop the procedure. Throughout the procedure, the patient's blood pressure and neurological status remained stable No evidence of hemodynamic instability was noted No gross mass-effect or move filling defects or extravasation was seen Prior to the attempted revascularization of the left internal carotid artery occlusion, the patient was given 180 mg of Brilinta, and 81 mg of aspirin orally the an orogastric tube. Also the patient was loaded with 3000 units IV heparin in order to facilitate revascularization. The the 8 [place] flu guide catheter was then retrieved and removed. The 8 [place] Brite tip neurovascular sheath was then exchanged over a J-tip guidewire for a in 8 French 35 cm Brite tip neurovascular sheath. This was then connected to continuous heparinized saline infusion An arteriogram performed through this 3 5 cm 8 French Brite tip neurovascular sheath in the abdominal aorta reveal excellent flow through the common iliacs, the external and internal iliacs, and also the visualized portions of the common femoral arteries below the level of the inguinal ligaments. At the end of procedure, a CT of the brain revealed no evidence of gross intracranial hemorrhage,, or mass effect or midline shift. The patient was left intubated on account of his wound at the time of his intubation The right groin appeared soft without evidence of a hematoma. Distal pulses in the dorsalis pedis, and posterior regions remained dopplerable bilaterally. The patient was then transferred to the neuro ICU for further post stroke management. IMPRESSION: . 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 Approximately 90%  stenosis of the dominant left vertebrobasilar junction distal to the origin of the left posterior-inferior cerebellar artery, and of approximately 70% of the proximal basilar artery. 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 PLAN: Follow-up in the clinic 1 month post discharge Electronically Signed   By: Julieanne Cotton M.D.   On: 08/28/2018 16:08   Ct Cerebral Perfusion W Contrast  Result Date: 08/27/2018 CLINICAL DATA:  Right-sided weakness onset this morning. EXAM: CT PERFUSION BRAIN TECHNIQUE: Multiphase CT imaging of the brain was performed following IV bolus contrast injection. Subsequent parametric perfusion maps were calculated using RAPID software. CONTRAST:  40mL ISOVUE-370 IOPAMIDOL (ISOVUE-370) INJECTION 76% COMPARISON:  CT a head today FINDINGS: CT Brain Perfusion Findings: CBF (<30%) Volume:  0mL Perfusion (Tmax>6.0s) volume: Mismatch Volume: ASPECTS on noncontrast CT Head today, not calculated due to extensive chronic ischemic change. No definite acute infarct on CT. Infarct Core: 0 mL Infarction Location:Delayed perfusion throughout the left MCA territory with apparent sparing of the basal ganglia. There is occlusion of the left internal carotid artery on CTA, age indeterminate. Left middle cerebral artery is diseased and may have delayed perfusion due to collateral circulation and stenosis. IMPRESSION: 155 mL of delayed perfusion in the left MCA territory. No core infarction. Delayed perfusion left MCA territory may be due to severe intracranial atherosclerotic disease as well as occlusion of the left internal carotid artery. Age of the internal carotid artery occlusion is indeterminate. It is possible this delayed perfusion is a chronic or possibly acute finding. Electronically Signed   By: Marlan Palau M.D.   On: 08/27/2018 16:12   Dg Chest Port 1 View  Result Date: 09/19/2018 CLINICAL DATA:  Respiratory failure EXAM: PORTABLE CHEST 1 VIEW COMPARISON:   09/18/2018 FINDINGS: Cardiac shadow is enlarged but stable. Aortic calcifications are again seen. Endotracheal tube, nasogastric catheter and right-sided PICC line are again noted and stable. Inspiratory effort is poor. Left basilar atelectasis and effusion is again noted. Vascular congestion is again seen although extension weighted by the poor inspiratory effort. IMPRESSION: Tubes and lines as described. Stable left basilar atelectasis with vascular congestion. Electronically Signed   By: Alcide Clever M.D.   On: 09/19/2018 07:27   Dg Chest Port 1 View  Result Date: 09/18/2018 CLINICAL DATA:  PICC line placement. EXAM: PORTABLE CHEST 1 VIEW COMPARISON:  09/17/2018. FINDINGS: Endotracheal tube and NG tube in stable position. Right PICC line noted over cavoatrial junction. Cardiomegaly with pulmonary venous congestion and bilateral interstitial prominence. Basilar atelectasis. Findings suggest CHF. Small left pleural effusion. IMPRESSION: 1. Endotracheal tube and NG tube in stable position. Right PICC line noted with tip over cavoatrial junction. 2. Cardiomegaly with pulmonary venous congestion bilateral interstitial prominence consistent CHF. 3.  Bibasilar atelectasis. Electronically Signed   By: Maisie Fus  Register   On: 09/18/2018 11:00   Dg Chest Port 1 View  Result Date: 09/17/2018 CLINICAL DATA:  Post intubation. EXAM: PORTABLE CHEST 1 VIEW COMPARISON:  Radiographs 09/15/2018 and 09/14/2018. FINDINGS: 1949 hours. Endotracheal tube tip is 3.3 cm above the carina. Enteric tube projects below the diaphragm with the tip projecting over the proximal stomach. Cardiomegaly, aortic atherosclerosis and pulmonary edema are again noted. There is mildly increased retrocardiac opacity which could reflect atelectasis or developing airspace disease. No pneumothorax or significant pleural effusion. IMPRESSION: 1. Satisfactory position of the endotracheal and enteric tubes. 2. Persistent edema suggesting congestive heart  failure. Increasing retrocardiac pulmonary opacity. Electronically Signed   By: Carey Bullocks M.D.   On: 09/17/2018 20:10   Dg Chest Port 1 View  Result Date: 09/15/2018 CLINICAL DATA:  Shortness of breath, leukocytosis EXAM: PORTABLE CHEST 1 VIEW COMPARISON:  09/14/2018 FINDINGS: Cardiomegaly with vascular congestion and interstitial prominence, likely interstitial edema. This has improved since prior study. Suspect small effusions. No acute bony abnormality. IMPRESSION: Continued interstitial edema, slightly improved since prior study. Suspect small effusions. Electronically Signed   By: Charlett Nose M.D.   On: 09/15/2018 10:55   Dg Chest Port 1 View  Result Date: 09/14/2018 CLINICAL DATA:  History of congestive heart failure EXAM: PORTABLE CHEST 1 VIEW COMPARISON:  Chest radiograph 09/09/2018 FINDINGS: Monitoring leads overlie the patient. Stable cardiomegaly. Low lung volumes. Bilateral interstitial pulmonary opacities. Small bilateral pleural effusions.  IMPRESSION: Cardiomegaly and mild interstitial edema. Electronically Signed   By: Annia Belt M.D.   On: 09/14/2018 08:04   Dg Chest Port 1 View  Result Date: 09/09/2018 CLINICAL DATA:  Congestive failure and shortness of breath EXAM: PORTABLE CHEST 1 VIEW COMPARISON:  09/08/2018 FINDINGS: Cardiac shadow remains enlarged. Aortic calcifications are again seen. Changes of congestive failure and interstitial edema are again noted and stable. Some minimal right basilar atelectasis is noted. IMPRESSION: Right basilar atelectasis. Stable changes of congestive failure and interstitial edema. Electronically Signed   By: Alcide Clever M.D.   On: 09/09/2018 10:42   Dg Chest Port 1 View  Result Date: 09/08/2018 CLINICAL DATA:  Short of breath EXAM: PORTABLE CHEST 1 VIEW COMPARISON:  09/03/2018 FINDINGS: Cardiac enlargement. Improvement in vascular congestion and edema since the prior study. Small pleural effusions and mild bibasilar atelectasis. Central  venous catheter tip has been removed IMPRESSION: Improving congestive heart failure and edema. Electronically Signed   By: Marlan Palau M.D.   On: 09/08/2018 13:47   Dg Chest Port 1 View  Result Date: 09/03/2018 CLINICAL DATA:  Shortness of breath. EXAM: PORTABLE CHEST 1 VIEW COMPARISON:  Radiograph September 02, 2018. FINDINGS: Stable cardiomegaly. Atherosclerosis of thoracic aorta is noted. No pneumothorax is noted. Right internal jugular catheter is unchanged in position. Stable bilateral pulmonary edema is noted with minimal pleural effusions. Bony thorax is unremarkable. IMPRESSION: Stable cardiomegaly with bilateral pulmonary edema and minimal pleural effusions. Aortic Atherosclerosis (ICD10-I70.0). Electronically Signed   By: Lupita Raider, M.D.   On: 09/03/2018 12:33   Dg Chest Port 1 View  Result Date: 09/02/2018 CLINICAL DATA:  Acute respiratory distress. EXAM: PORTABLE CHEST 1 VIEW COMPARISON:  One-view chest x-ray 09/01/2018 FINDINGS: The heart is enlarged. Patient has been extubated. NG tube was removed. A right IJ line remains. Atherosclerotic changes are present at the aortic arch. Diffuse interstitial edema is stable. Bibasilar airspace disease is worse on the left. IMPRESSION: 1. Interval extubation and removal of NG tube. 2. Stable cardiomegaly and edema consistent with congestive heart failure. 3. Aortic atherosclerosis. Electronically Signed   By: Marin Roberts M.D.   On: 09/02/2018 07:29   Dg Chest Port 1 View  Result Date: 09/01/2018 CLINICAL DATA:  Respiratory failure. EXAM: PORTABLE CHEST 1 VIEW COMPARISON:  08/31/2018 FINDINGS: Endotracheal tube terminates 4.2 cm above the carina. Right jugular catheter terminates over the mid SVC. Enteric tube courses into the left upper abdomen. The cardiac silhouette remains enlarged. Patchy airspace opacities throughout both lungs have not significantly changed. IMPRESSION: Bilateral airspace disease without significant interval  change. Electronically Signed   By: Sebastian Ache M.D.   On: 09/01/2018 06:54   Dg Chest Port 1 View  Result Date: 08/31/2018 CLINICAL DATA:  CXR for Respiratory failure. Hx of HTN, stroke, AAA, Diabetes, and Cardiomyopathy. EXAM: PORTABLE CHEST 1 VIEW COMPARISON:  08/30/2018 FINDINGS: Endotracheal tube is in place, tip 3.8 centimeters above the carina. Nasogastric tube is in place, tip beyond the gastroesophageal junction and off the image. A RIGHT IJ central line tip overlies the superior vena cava. Patient is rotated towards the LEFT. The heart is enlarged. There is persistent airspace filling opacity throughout the RIGHT lung, slightly improved. There is significant opacity in the LEFT lung base, consistent with atelectasis or consolidation and increased since 08/27/2018. IMPRESSION: 1. Slight improvement in aeration of the RIGHT lung. 2. Increased opacity in the LEFT lung base. Electronically Signed   By: Norva Pavlov M.D.   On:  08/31/2018 09:52   Dg Chest Port 1 View  Result Date: 08/30/2018 CLINICAL DATA:  Respiratory failure. Cardiomyopathy. EXAM: PORTABLE CHEST 1 VIEW COMPARISON:  08/29/2018 and 08/27/2018 FINDINGS: Endotracheal tube and NG tube and central line appear unchanged and in good position. There has been progression of the bilateral diffuse pulmonary infiltrates, particularly in the right upper lung zone of the left lung base. Heart size and pulmonary vascularity are normal. No discrete effusions. Aortic atherosclerosis. No acute bone abnormality. IMPRESSION: Progressive bilateral pulmonary infiltrates. Aortic Atherosclerosis (ICD10-I70.0). Electronically Signed   By: Francene Boyers M.D.   On: 08/30/2018 08:48   Dg Chest Port 1 View  Result Date: 08/29/2018 CLINICAL DATA:  Central line placement EXAM: PORTABLE CHEST 1 VIEW COMPARISON:  08/29/2018 FINDINGS: Right central line is been placed with the tip in the SVC. No pneumothorax. Endotracheal tube and NG tube are unchanged.  Cardiomegaly with vascular congestion and mild pulmonary edema, stable. IMPRESSION: Right central line tip in the SVC. No pneumothorax. Continued mild CHF. Electronically Signed   By: Charlett Nose M.D.   On: 08/29/2018 11:58   Dg Chest Port 1 View  Result Date: 08/29/2018 CLINICAL DATA:  PT presented to [facility] ED on 08/27/18 with complaints of right lower extremity weakness, dyspnea and chest pressure. Hx of stroke, Peripheral vascualar disease, cardiomyopathy, and A-fib. EXAM: PORTABLE CHEST 1 VIEW COMPARISON:  08/27/2018 FINDINGS: Mild enlargement of the cardiac silhouette, stable. No mediastinal or hilar masses. There is central vascular congestion. Mild hazy opacity noted in the perihilar regions and medial lung bases bilaterally. No convincing pleural effusion and no pneumothorax. Endotracheal tube tip projects 3.8 cm above the Carina. Nasogastric tube passes below the diaphragm well into the stomach. IMPRESSION: 1. Findings are consistent with mild congestive heart failure with evidence of slight improvement when compared to 08/27/2018. No new abnormalities. 2. Support apparatus is well positioned. Electronically Signed   By: Amie Portland M.D.   On: 08/29/2018 07:26   Dg Chest Port 1 View  Result Date: 08/28/2018 CLINICAL DATA:  Shortness of Breath EXAM: PORTABLE CHEST 1 VIEW COMPARISON:  08/27/2018 FINDINGS: Cardiac shadow is enlarged in size. Aortic calcifications are noted. Endotracheal tube is noted at the level of the carina. This should be withdrawn 2-3 cm. Nasogastric catheter is noted within the stomach. Lungs are well aerated bilaterally with mild interstitial edema similar to that seen on the prior exam. IMPRESSION: Interval intubation with the endotracheal tube at the level of the carina. This should be withdrawn 2-3 cm. Stable edema bilaterally. These results will be called to the ordering clinician or representative by the Radiologist Assistant, and communication documented in the PACS or  zVision Dashboard. Electronically Signed   By: Alcide Clever M.D.   On: 08/28/2018 00:19   Dg Abd Portable 1v  Result Date: 08/28/2018 CLINICAL DATA:  Orogastric tube placement. EXAM: PORTABLE ABDOMEN - 1 VIEW COMPARISON:  None. FINDINGS: The patient's enteric tube is seen ending overlying the body of the stomach, with the side port about the fundus of the stomach. The visualized bowel gas pattern is grossly unremarkable. A small left pleural effusion is noted. Increased interstitial markings raise concern for pulmonary edema. Vascular congestion is noted. The patient's endotracheal tube is seen ending 2-3 cm above the carina. No acute osseous abnormalities are identified. IMPRESSION: 1. Enteric tube seen ending overlying the body of the stomach, with the side port about the fundus of the stomach. 2. Small left pleural effusion noted. Increased interstitial markings raise concern  for pulmonary edema. Electronically Signed   By: Roanna Raider M.D.   On: 08/28/2018 22:08   Vas [place] Groin Pseudoaneurysm  Result Date: 09/16/2018  ARTERIAL PSEUDOANEURYSM  Exam: Right groin History: Follow up pseudoaneurysm. Comparison Study: 09/04/2018 Performing Technologist: Jeb Levering RDMS, RVT  Examination Guidelines: A complete evaluation includes B-mode imaging, spectral Doppler, color Doppler, and power Doppler as needed of all accessible portions of each vessel. Bilateral testing is considered an integral part of a complete examination. Limited examinations for reoccurring indications may be performed as noted. +------------+----------+--------+------+----------+ Right DuplexPSV (cm/s)WaveformPlaqueComment(s) +------------+----------+--------+------+----------+ CFA             77    biphasic                 +------------+----------+--------+------+----------+ Prox SFA        65    biphasic                 +------------+----------+--------+------+----------+ Right Vein comments:Patent common femoral vein.   Findings: An area with well defined borders measuring 4.0 cm x 2.2 cm was visualized arising off of the Right CFA with ultrasound characteristics of a pseudoaneurysm. Mixed echos within the structure suggest that it is partially thrombosed with a residual diameter  of 2.5 cm x 1.9 cm. The neck measures approximately 0.6 cm wide and 1.8 cm long.  Diagnosing physician: Coral Else MD Electronically signed by Coral Else MD on 09/16/2018 at 6:30:39 PM.   --------------------------------------------------------------------------------    Final    Vas [place] Lower Ext Arterial Pseudo Injection  Result Date: 09/17/2018  ARTERIAL PSEUDOANEURYSM  Exam: Right groin Performing Technologist: Blanch Media RVS Supporting Technologist: Jeb Levering RDMS, RVT  Examination Guidelines: A complete evaluation includes B-mode imaging, spectral Doppler, color Doppler, and power Doppler as needed of all accessible portions of each vessel. Bilateral testing is considered an integral part of a complete examination. Limited examinations for reoccurring indications may be performed as noted.  Findings: An area with well defined borders measuring 2.1 cm x 2.3 cm was visualized with ultrasound characteristics of a pseudoaneurysm.  Summary: Successful thrombin injection into right groin pseudoaneurysm. Completed by Dr. Chestine Spore.  Post injection the right SFA is patent.  Diagnosing physician: Gretta Began MD Electronically signed by Gretta Began MD on 09/17/2018 at 8:08:45 PM.   --------------------------------------------------------------------------------    Final    Ir Percutaneous Art Thrombectomy/infusion Intracranial Inc Diag Angio  Result Date: 08/31/2018 CLINICAL DATA:  New onset right-sided weakness with mild word-finding difficulties. 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 EXAM: IR ANGIO INTRA EXTRACRAN SEL COM CAROTID INNOMINATE UNI RIGHT MOD SED; IR PERCUTANEOUS ART THORMBECTOMY/INFUSION INTRACRANIAL INCLUDE DIAG ANGIO; IR ANGIO VERTEBRAL SEL SUBCLAVIAN INNOMINATE UNI LEFT MOD SED; IR CT HEAD LIMITED COMPARISON:  CT angiogram of the head and neck of 08/27/2018 MEDICATIONS: Heparin 3,000 units IV; . Ancef 2 g IV antibiotic was administered within 1 hour of the procedure ANESTHESIA/SEDATION: General anesthesia CONTRAST:  Isovue 300 approximately 100 mL FLUOROSCOPY TIME:  Fluoroscopy Time: 60 minutes 0 seconds (4327 mGy). COMPLICATIONS: None immediate. TECHNIQUE: Informed written consent was obtained from the patient after a thorough discussion of the procedural risks, benefits and alternatives. All questions were addressed. Maximal Sterile Barrier Technique was utilized including caps, mask, sterile gowns, sterile gloves, sterile drape, hand hygiene and skin antiseptic. A timeout was performed  prior to the initiation of the procedure. The right groin was prepped and draped in the usual sterile fashion. Thereafter using modified Seldinger technique, transfemoral access into the right common femoral artery was obtained without difficulty. Over a 0.035 inch guidewire, a 5 French Pinnacle sheath was inserted. Through this, and also over 0.035 inch guidewire, a 5 [place] JB 1 catheter was advanced to the aortic arch region and selectively positioned in the right common carotid artery, the innominate artery , the left common carotid artery and the left vertebral artery. FINDINGS: The left common carotid artery tear g demonstrates a moderate tortuosity of the proximal left common carotid artery The left external carotid artery proximally has a mild to moderate stenosis. Is branches are otherwise normally opacified The left internal carotid artery at the bulb would demonstrates the complete angiographic occlusion with no evidence of a string sign on the delayed arterial images. The  innominate artery arteriogram demonstrates patency of the right subclavian artery and the right common carotid artery proximally There is no angiographic evidence of the right vertebral artery The right common carotid bifurcation demonstrates the right external carotid artery and its major branches to be widely patent The right internal carotid artery just distal to the bulb has a severe 90% plus stenosis. Distal to this there is a U-shaped configuration of the right internal carotid artery. More distally the vessel is normal caliber The petrous the cavernous segments and supraclinoid segments are widely patent There is mild atherosclerotic irregularity involving the proximal cavernous, and distal cavernous portions of the right internal carotid artery. Right posterior communicating artery is seen opacifying the right posterior cerebral artery and the distal basilar artery. The right middle cerebral artery has approximately 30 40% stenosis of the right M1 segment The trifurcation branches appear to be widely patent into the capillary and venous phases The right anterior cerebral artery A1 segment and distally demonstrate wide patency is into the capillary and venous phases There is simultaneous cross-filling via the anterior communicating artery of the left anterior cerebral artery A2 segment and distally. Also demonstrated is opacification of the left anterior cerebral A1 segment, with flow noted into the left middle cerebral artery distribution. Unopacified blood is seen in the left middle cerebral artery from the collateral circulation. 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 More distally the vessel is seen to opacify to the  cranial skull base There is severe focal of focal stenosis of the left vertebrobasilar junction just distal to the origin of the left posterior-inferior cerebellar artery Additionally there is a approximately 70% stenosis of the proximal basilar artery More distally the basilar artery demonstrates mild caliber irregularity. There is a opacification of the left posterior cerebral artery the superior cerebellar arteries left greater than right, and the anterior-inferior cerebellar arteries Retrograde opacification of the right vertebrobasilar junction to the level of the right posterior-inferior cerebellar arteries is noted. Caliber irregularity of the right posterior-inferior cerebellar artery suggests intracranial arteriosclerosis. Left internal carotid artery occlusion or revascularization attempt The diagnostic JB 1 catheter left common carotid artery was then exchanged over a 0.035 inch 300 cm a 7 French Pinnacle sheath in the right groin. A diagnostic JB 1 catheter were then advanced over the rise exchange guidewire to the descending  thoracic aorta. The 7 French Pinnacle sheath was then exchanged over a 0.035 inch 100 cm Amplatz stiff guidewire for a a 8 French 55 cm Brite tip neurovascular sheath using biplane roadmap technique and constant fluoroscopic guidance. Good good aspiration was obtained from the side port of the neurovascular sheath. This was then connected to continuous heparinized saline infusion. The embolus guidewire was then removed. Through the 8 French neurovascular sheath, over a 0.035 inch roadrunner guidewire, a 5 5 [place] JB 1 catheter was advanced to the recover region and select position in the left common carotid artery. Using biplane technique and constant fluoroscopic guidance, over the of the 5 inch roadrunner guidewire, the 5 [place] JB 1 catheter was advanced to the left external carotid artery and exchanged over a 0.035 inch 300 cm rise exchange guidewire for an 8 French 85 cm flow  gap balloon guide catheter which had been prepped with 50% contrast and 50% heparinized saline infusion The guidewire was removed. Good aspiration obtained from the hub of the 8 [place] Flo guide catheter. Gentle contrast injection demonstrated no evidence of spasms dissections or of intraluminal filling defects. At this time, in a coaxial manner and with constant heparinized saline infusion, a tree approached 1 microcatheter was advanced over a point 0.014 inch soft tip synchro micro guidewire to the distal end of the Flo guide catheter The Flood guide catheter was advanced to just inside the left internal carotid artery bulb. Thereafter, multiple attempts were made to advance the microcatheter over the micro guidewire first with Ace synchro soft and then a regular synchro guidewire. In 016 headliner double angled micro guidewire was also utilized in order to access the occluded left internal carotid artery. It was noted that there was a severe tortuosity at the junction of the proximal and the middle 1/3 of the left internal carotid artery Following multiple attempts it was decided to stop. A control was then performed through the [facility] guide catheter in the left common carotid artery which revealed brisk cross-filling of the left internal carotid artery distal cavernous and supraclinoid segments from the left external carotid artery branches via the ipsilateral ophthalmic artery Flow is noted into the left middle cerebral artery as well Also noted was a retrograde opacification from the left posterior communicating artery into the supraclinoid left ICA and then middle cerebral artery An earlier diagnostic catheter arteriogram of the right common carotid artery he had revealed partial cross-filling via the anterior communicating artery from the right internal carotid artery Given the the collateral circulation, it was decided to stop the procedure. Throughout the procedure, the patient's blood pressure and  neurological status remained stable No evidence of hemodynamic instability was noted No gross mass-effect or move filling defects or extravasation was seen Prior to the attempted revascularization of the left internal carotid artery occlusion, the patient was given 180 mg of Brilinta, and 81 mg of aspirin orally the an orogastric tube. Also the patient was loaded with 3000 units IV heparin in order to facilitate revascularization. The the 8 [place] flu guide catheter was then retrieved and removed. The 8 [place] Brite tip neurovascular sheath was then exchanged over a J-tip guidewire for a in 8 French 35 cm Brite tip neurovascular sheath. This was then connected to continuous heparinized saline infusion An arteriogram performed through this 3 5 cm 8 French Brite tip neurovascular sheath in the abdominal aorta reveal excellent flow through the common iliacs, the external and internal iliacs, and also the visualized portions of the  common femoral arteries below the level of the inguinal ligaments. At the end of procedure, a CT of the brain revealed no evidence of gross intracranial hemorrhage,, or mass effect or midline shift. The patient was left intubated on account of his wound at the time of his intubation The right groin appeared soft without evidence of a hematoma. Distal pulses in the dorsalis pedis, and posterior regions remained dopplerable bilaterally. The patient was then transferred to the neuro ICU for further post stroke management. IMPRESSION: . 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 Approximately 90% stenosis of the dominant left vertebrobasilar junction distal to the origin of the left posterior-inferior cerebellar artery, and of approximately 70% of the proximal basilar artery. 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 PLAN:  Follow-up in the clinic 1 month post discharge Electronically Signed   By: Julieanne Cotton M.D.   On: 08/28/2018 16:08   Ct Head Code Stroke Wo Contrast`  Result Date: 08/27/2018 CLINICAL DATA:  Code stroke. Slurred speech beginning 3 weeks ago. Right-sided weakness beginning today. EXAM: CT HEAD WITHOUT CONTRAST TECHNIQUE: Contiguous axial images were obtained from the base of the skull through the vertex without intravenous contrast. COMPARISON:  CT 03/07/2013 FINDINGS: Brain: The brainstem and cerebellum appear normal by CT. Cerebral hemispheres show age related volume loss. There are old cortical and subcortical infarctions in the right frontal lobe, left parietal vertex, left frontal lobe, and left frontoparietal vertex. The show atrophy and encephalomalacia with gliosis. There is no finding of acute or subacute infarction on this CT. There is old lacunar infarction in the left caudate. Vascular: There is atherosclerotic calcification of the major vessels at the base of the brain. No evidence of acute hyperdense vessel. Skull: Negative Sinuses/Orbits: Clear/normal Other: None ASPECTS ([place] Stroke Program Early CT Score) Aspects is difficult in this patient with old infarctions. I do not suspect an acute insult. IMPRESSION: 1. No acute finding by CT. 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 2. 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 No acute finding is suspected by CT. 3. These results were called by telephone at the time of interpretation on 08/27/2018 at 11:47 am to Dr. Daryel November , who verbally acknowledged these results. Electronically Signed   By: Paulina Fusi M.D.   On: 08/27/2018 11:49   Vas [place] Lower Extremity Arterial Duplex  Result Date: 09/04/2018 LOWER EXTREMITY ARTERIAL DUPLEX STUDY  Current ABI: Not obtained Limitations: Cerebral angiogram 08/27/18, now with  bleeding, pain, and bruising              at groin stick site. Performing Technologist: Gertie Fey MHA, RDMS, RVT, RDCS  Examination Guidelines: A complete evaluation includes B-mode imaging, spectral Doppler, color Doppler, and power Doppler as needed of all accessible portions of each vessel. Bilateral testing is considered an integral part of a complete examination. Limited examinations for reoccurring indications may be performed as noted.  Right Duplex Findings: +----------+--------+-----+--------+----------+--------+           PSV cm/sRatioStenosisWaveform  Comments +----------+--------+-----+--------+----------+--------+ CFA Prox  102                  monophasic         +----------+--------+-----+--------+----------+--------+ CFA Distal90  monophasic         +----------+--------+-----+--------+----------+--------+ PTA Distal32                   monophasic         +----------+--------+-----+--------+----------+--------+ DP        44                   monophasic         +----------+--------+-----+--------+----------+--------+  Summary: Right: A right common femoral artery pseudoaneurysm was identified measuring 2.8 x 1.5 x 2.5 cm, with a neck measuring 1.3cm long x 0.6cm wide. Pseudoaneurysm measures 2.1cm from the surface of the skin. The proximal right common femoral artery exhibits a monophasic waveform, suggestive of possible aortoiliac obstruction. Distal posterior tibial artery exhibits monophasic flow, and the right dorsalis pedis artery exhibits monophasic flow, suggestive of proximal obstruction.  See table(s) above for measurements and observations. Electronically signed by Gretta Began MD on 09/04/2018 at 5:42:19 PM.    Final    [place] Ekg Site Rite  Result Date: 09/18/2018 If Site Rite image not attached, placement could not be confirmed due to current cardiac rhythm.  Ir Angio Intra Extracran Sel Com Carotid Innominate Uni R Mod  Sed  Result Date: 08/31/2018 CLINICAL DATA:  New onset right-sided weakness with mild word-finding difficulties. 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 EXAM: IR ANGIO INTRA EXTRACRAN SEL COM CAROTID INNOMINATE UNI RIGHT MOD SED; IR PERCUTANEOUS ART THORMBECTOMY/INFUSION INTRACRANIAL INCLUDE DIAG ANGIO; IR ANGIO VERTEBRAL SEL SUBCLAVIAN INNOMINATE UNI LEFT MOD SED; IR CT HEAD LIMITED COMPARISON:  CT angiogram of the head and neck of 08/27/2018 MEDICATIONS: Heparin 3,000 units IV; . Ancef 2 g IV antibiotic was administered within 1 hour of the procedure ANESTHESIA/SEDATION: General anesthesia CONTRAST:  Isovue 300 approximately 100 mL FLUOROSCOPY TIME:  Fluoroscopy Time: 60 minutes 0 seconds (4327 mGy). COMPLICATIONS: None immediate. TECHNIQUE: Informed written consent was obtained from the patient after a thorough discussion of the procedural risks, benefits and alternatives. All questions were addressed. Maximal Sterile Barrier Technique was utilized including caps, mask, sterile gowns, sterile gloves, sterile drape, hand hygiene and skin antiseptic. A timeout was performed prior to the initiation of the procedure. The right groin was prepped and draped in the usual sterile fashion. Thereafter using modified Seldinger technique, transfemoral access into the right common femoral artery was obtained without difficulty. Over a 0.035 inch guidewire, a 5 French Pinnacle sheath was inserted. Through this, and also over 0.035 inch guidewire, a 5 [place] JB 1 catheter was advanced to the aortic arch region and selectively positioned in the right common carotid artery, the innominate artery , the left common carotid artery and the left vertebral artery. FINDINGS: The left common carotid artery tear g demonstrates a moderate tortuosity of the proximal left  common carotid artery The left external carotid artery proximally has a mild to moderate stenosis. Is branches are otherwise normally opacified The left internal carotid artery at the bulb would demonstrates the complete angiographic occlusion with no evidence of a string sign on the delayed arterial images. The innominate artery arteriogram demonstrates patency of the right subclavian artery and the right common carotid artery proximally There is no angiographic evidence of the right vertebral artery The right common carotid bifurcation demonstrates the right external carotid artery and its major branches to be  widely patent The right internal carotid artery just distal to the bulb has a severe 90% plus stenosis. Distal to this there is a U-shaped configuration of the right internal carotid artery. More distally the vessel is normal caliber The petrous the cavernous segments and supraclinoid segments are widely patent There is mild atherosclerotic irregularity involving the proximal cavernous, and distal cavernous portions of the right internal carotid artery. Right posterior communicating artery is seen opacifying the right posterior cerebral artery and the distal basilar artery. The right middle cerebral artery has approximately 30 40% stenosis of the right M1 segment The trifurcation branches appear to be widely patent into the capillary and venous phases The right anterior cerebral artery A1 segment and distally demonstrate wide patency is into the capillary and venous phases There is simultaneous cross-filling via the anterior communicating artery of the left anterior cerebral artery A2 segment and distally. Also demonstrated is opacification of the left anterior cerebral A1 segment, with flow noted into the left middle cerebral artery distribution. Unopacified blood is seen in the left middle cerebral artery from the collateral circulation. 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 More distally the vessel is seen to opacify to the cranial skull base There is severe focal of focal stenosis of the left vertebrobasilar junction just distal to the origin of the left posterior-inferior cerebellar artery Additionally there is a approximately 70% stenosis of the proximal basilar artery More distally the basilar artery demonstrates mild caliber irregularity. There is a opacification of the left posterior cerebral artery the superior cerebellar arteries left greater than right, and the anterior-inferior cerebellar arteries Retrograde opacification of the right vertebrobasilar junction to the level of the right posterior-inferior cerebellar arteries is noted. Caliber irregularity of the right posterior-inferior cerebellar artery suggests intracranial arteriosclerosis. Left internal carotid artery occlusion or revascularization attempt The diagnostic JB 1 catheter left common carotid artery was then exchanged over a 0.035 inch 300 cm a 7 French Pinnacle sheath in the right groin. A diagnostic JB 1 catheter were then advanced over the rise exchange guidewire to the descending thoracic aorta. The 7 French Pinnacle sheath was then exchanged over a 0.035 inch 100 cm Amplatz stiff guidewire for a a 8 French 55 cm Brite tip neurovascular sheath using biplane roadmap technique and constant fluoroscopic guidance. Good good aspiration was obtained from the side port of the neurovascular sheath. This was then connected to continuous heparinized saline infusion. The embolus guidewire was then removed. Through the 8 French neurovascular sheath, over a 0.035 inch roadrunner guidewire, a 5 5 [place] JB 1 catheter was advanced to the recover region and  select position in the left common carotid artery. Using biplane technique and constant fluoroscopic guidance, over the of the 5 inch roadrunner guidewire, the 5 [place] JB 1 catheter was advanced to the left external carotid artery and exchanged over a 0.035 inch 300 cm rise exchange guidewire for an 8 French 85 cm flow gap balloon guide catheter which had been prepped with 50% contrast and 50% heparinized saline infusion The guidewire was removed. Good aspiration obtained from the hub of the 8 [place] Flo guide catheter. Gentle contrast injection demonstrated no evidence of spasms dissections or of intraluminal filling defects. At this time, in a coaxial  manner and with constant heparinized saline infusion, a tree approached 1 microcatheter was advanced over a point 0.014 inch soft tip synchro micro guidewire to the distal end of the Flo guide catheter The Flood guide catheter was advanced to just inside the left internal carotid artery bulb. Thereafter, multiple attempts were made to advance the microcatheter over the micro guidewire first with Ace synchro soft and then a regular synchro guidewire. In 016 headliner double angled micro guidewire was also utilized in order to access the occluded left internal carotid artery. It was noted that there was a severe tortuosity at the junction of the proximal and the middle 1/3 of the left internal carotid artery Following multiple attempts it was decided to stop. A control was then performed through the [facility] guide catheter in the left common carotid artery which revealed brisk cross-filling of the left internal carotid artery distal cavernous and supraclinoid segments from the left external carotid artery branches via the ipsilateral ophthalmic artery Flow is noted into the left middle cerebral artery as well Also noted was a retrograde opacification from the left posterior communicating artery into the supraclinoid left ICA and then middle cerebral artery An earlier  diagnostic catheter arteriogram of the right common carotid artery he had revealed partial cross-filling via the anterior communicating artery from the right internal carotid artery Given the the collateral circulation, it was decided to stop the procedure. Throughout the procedure, the patient's blood pressure and neurological status remained stable No evidence of hemodynamic instability was noted No gross mass-effect or move filling defects or extravasation was seen Prior to the attempted revascularization of the left internal carotid artery occlusion, the patient was given 180 mg of Brilinta, and 81 mg of aspirin orally the an orogastric tube. Also the patient was loaded with 3000 units IV heparin in order to facilitate revascularization. The the 8 [place] flu guide catheter was then retrieved and removed. The 8 [place] Brite tip neurovascular sheath was then exchanged over a J-tip guidewire for a in 8 French 35 cm Brite tip neurovascular sheath. This was then connected to continuous heparinized saline infusion An arteriogram performed through this 3 5 cm 8 French Brite tip neurovascular sheath in the abdominal aorta reveal excellent flow through the common iliacs, the external and internal iliacs, and also the visualized portions of the common femoral arteries below the level of the inguinal ligaments. At the end of procedure, a CT of the brain revealed no evidence of gross intracranial hemorrhage,, or mass effect or midline shift. The patient was left intubated on account of his wound at the time of his intubation The right groin appeared soft without evidence of a hematoma. Distal pulses in the dorsalis pedis, and posterior regions remained dopplerable bilaterally. The patient was then transferred to the neuro ICU for further post stroke management. IMPRESSION: . 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 Approximately 90% stenosis of the dominant left vertebrobasilar junction distal to the origin of the left posterior-inferior cerebellar artery, and of approximately 70% of the proximal basilar artery. 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 PLAN: Follow-up in the clinic 1 month post discharge Electronically Signed   By: Julieanne Cotton M.D.   On: 08/28/2018 16:08   Ir Angio Vertebral Sel Subclavian Innominate Uni L Mod Sed  Result Date: 08/31/2018 CLINICAL DATA:  New onset right-sided weakness with mild word-finding difficulties. 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 EXAM: IR ANGIO INTRA EXTRACRAN SEL COM CAROTID INNOMINATE UNI RIGHT MOD SED; IR PERCUTANEOUS ART THORMBECTOMY/INFUSION INTRACRANIAL INCLUDE DIAG ANGIO; IR ANGIO VERTEBRAL SEL SUBCLAVIAN INNOMINATE UNI LEFT MOD SED; IR CT HEAD LIMITED COMPARISON:  CT angiogram of the head and neck of 08/27/2018 MEDICATIONS: Heparin 3,000 units IV; . Ancef 2 g IV antibiotic was administered within 1 hour of the procedure ANESTHESIA/SEDATION: General anesthesia CONTRAST:  Isovue 300 approximately 100 mL FLUOROSCOPY TIME:  Fluoroscopy Time: 60 minutes 0 seconds (4327 mGy). COMPLICATIONS: None immediate. TECHNIQUE: Informed written consent was obtained from the patient after a thorough discussion of the procedural risks, benefits and alternatives. All questions were addressed. Maximal Sterile Barrier Technique was utilized including caps, mask, sterile gowns, sterile gloves, sterile drape, hand hygiene and skin antiseptic. A timeout was performed prior to the initiation of the procedure. The right groin was prepped and draped in the usual sterile fashion. Thereafter using modified Seldinger technique, transfemoral access into the right  common femoral artery was obtained without difficulty. Over a 0.035 inch guidewire, a 5 French Pinnacle sheath was inserted. Through this, and also over 0.035 inch guidewire, a 5 [place] JB 1 catheter was advanced to the aortic arch region and selectively positioned in the right common carotid artery, the innominate artery , the left common carotid artery and the left vertebral artery. FINDINGS: The left common carotid artery tear g demonstrates a moderate tortuosity of the proximal left common carotid artery The left external carotid artery proximally has a mild to moderate stenosis. Is branches are otherwise normally opacified The left internal carotid artery at the bulb would demonstrates the complete angiographic occlusion with no evidence of a string sign on the delayed arterial images. The innominate artery arteriogram demonstrates patency of the right subclavian artery and the right common carotid artery proximally There is no angiographic evidence of the right vertebral artery The right common carotid bifurcation demonstrates the right external carotid artery and its major branches to be widely patent The right internal carotid artery just distal to the bulb has a severe 90% plus stenosis. Distal to this there is a U-shaped configuration of the right internal carotid artery. More distally the vessel is normal caliber The petrous the cavernous segments and supraclinoid segments are widely patent There is mild atherosclerotic irregularity involving the proximal cavernous, and distal cavernous portions of the right internal carotid artery. Right posterior communicating artery is seen opacifying the right posterior cerebral artery and the distal basilar artery. The right middle cerebral artery has approximately 30 40% stenosis of the right M1 segment The trifurcation branches appear to be widely patent into the capillary and venous phases The right anterior cerebral artery A1 segment and distally demonstrate wide  patency is into the capillary and venous phases There is simultaneous cross-filling via the anterior communicating artery of the left anterior cerebral artery A2 segment and distally. Also demonstrated is opacification of the left anterior cerebral A1 segment, with flow noted into the left middle cerebral artery distribution. Unopacified blood is seen in the left middle cerebral artery from the collateral circulation. The left subclavian arteriogram demonstrates mild stenosis at the origin of the right subclavian artery Mild atherosclerotic disease is noted of the subclavian artery and in the region of the thyrocervical trunk The dominant left vertebral artery origin is widely  patent The vessel demonstrates mild tortuosity just distal to this. More distally the vessel is seen to opacify to the cranial skull base There is severe focal of focal stenosis of the left vertebrobasilar junction just distal to the origin of the left posterior-inferior cerebellar artery Additionally there is a approximately 70% stenosis of the proximal basilar artery More distally the basilar artery demonstrates mild caliber irregularity. There is a opacification of the left posterior cerebral artery the superior cerebellar arteries left greater than right, and the anterior-inferior cerebellar arteries Retrograde opacification of the right vertebrobasilar junction to the level of the right posterior-inferior cerebellar arteries is noted. Caliber irregularity of the right posterior-inferior cerebellar artery suggests intracranial arteriosclerosis. Left internal carotid artery occlusion or revascularization attempt The diagnostic JB 1 catheter left common carotid artery was then exchanged over a 0.035 inch 300 cm a 7 French Pinnacle sheath in the right groin. A diagnostic JB 1 catheter were then advanced over the rise exchange guidewire to the descending thoracic aorta. The 7 French Pinnacle sheath was then exchanged over a 0.035 inch 100 cm  Amplatz stiff guidewire for a a 8 French 55 cm Brite tip neurovascular sheath using biplane roadmap technique and constant fluoroscopic guidance. Good good aspiration was obtained from the side port of the neurovascular sheath. This was then connected to continuous heparinized saline infusion. The embolus guidewire was then removed. Through the 8 French neurovascular sheath, over a 0.035 inch roadrunner guidewire, a 5 5 [place] JB 1 catheter was advanced to the recover region and select position in the left common carotid artery. Using biplane technique and constant fluoroscopic guidance, over the of the 5 inch roadrunner guidewire, the 5 [place] JB 1 catheter was advanced to the left external carotid artery and exchanged over a 0.035 inch 300 cm rise exchange guidewire for an 8 French 85 cm flow gap balloon guide catheter which had been prepped with 50% contrast and 50% heparinized saline infusion The guidewire was removed. Good aspiration obtained from the hub of the 8 [place] Flo guide catheter. Gentle contrast injection demonstrated no evidence of spasms dissections or of intraluminal filling defects. At this time, in a coaxial manner and with constant heparinized saline infusion, a tree approached 1 microcatheter was advanced over a point 0.014 inch soft tip synchro micro guidewire to the distal end of the Flo guide catheter The Flood guide catheter was advanced to just inside the left internal carotid artery bulb. Thereafter, multiple attempts were made to advance the microcatheter over the micro guidewire first with Ace synchro soft and then a regular synchro guidewire. In 016 headliner double angled micro guidewire was also utilized in order to access the occluded left internal carotid artery. It was noted that there was a severe tortuosity at the junction of the proximal and the middle 1/3 of the left internal carotid artery Following multiple attempts it was decided to stop. A control was then performed  through the [facility] guide catheter in the left common carotid artery which revealed brisk cross-filling of the left internal carotid artery distal cavernous and supraclinoid segments from the left external carotid artery branches via the ipsilateral ophthalmic artery Flow is noted into the left middle cerebral artery as well Also noted was a retrograde opacification from the left posterior communicating artery into the supraclinoid left ICA and then middle cerebral artery An earlier diagnostic catheter arteriogram of the right common carotid artery he had revealed partial cross-filling via the anterior communicating artery from the right internal carotid artery  Given the the collateral circulation, it was decided to stop the procedure. Throughout the procedure, the patient's blood pressure and neurological status remained stable No evidence of hemodynamic instability was noted No gross mass-effect or move filling defects or extravasation was seen Prior to the attempted revascularization of the left internal carotid artery occlusion, the patient was given 180 mg of Brilinta, and 81 mg of aspirin orally the an orogastric tube. Also the patient was loaded with 3000 units IV heparin in order to facilitate revascularization. The the 8 [place] flu guide catheter was then retrieved and removed. The 8 [place] Brite tip neurovascular sheath was then exchanged over a J-tip guidewire for a in 8 French 35 cm Brite tip neurovascular sheath. This was then connected to continuous heparinized saline infusion An arteriogram performed through this 3 5 cm 8 French Brite tip neurovascular sheath in the abdominal aorta reveal excellent flow through the common iliacs, the external and internal iliacs, and also the visualized portions of the common femoral arteries below the level of the inguinal ligaments. At the end of procedure, a CT of the brain revealed no evidence of gross intracranial hemorrhage,, or mass effect or midline shift. The  patient was left intubated on account of his wound at the time of his intubation The right groin appeared soft without evidence of a hematoma. Distal pulses in the dorsalis pedis, and posterior regions remained dopplerable bilaterally. The patient was then transferred to the neuro ICU for further post stroke management. IMPRESSION: . 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 Approximately 90% stenosis of the dominant left vertebrobasilar junction distal to the origin of the left posterior-inferior cerebellar artery, and of approximately 70% of the proximal basilar artery. 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 PLAN: Follow-up in the clinic 1 month post discharge Electronically Signed   By: Julieanne Cotton M.D.   On: 08/28/2018 16:08     LOS: 6 days   Laurette Schimke, PA-S  [facility]  If 7PM-7AM, please contact night-coverage  Please page via [email or web address]-click on MD name and type text message  09/23/2018, 8:33 AM

## 2018-09-23 NOTE — Progress Notes (Signed)
Occupational Therapy Treatment Patient Details Name: Darren Frazier MRN: [id number] DOB: 1941-04-27 Today's Date: 09/23/2018    History of present illness Pt is a 78 y/o male who was discharged on 09/17/18 after hospitalization 12/25 -1/15 for acute stroke. Pt was refused at rehab facility due to hypoxia. He required intubation for airway protection on arrival to ER (intubated 1/15-1/17).  Head CT neg.  CXR c/w pulmonary edema. PMH including but not limited to recent L MCA/ACA stroke, DM, HTN, PVD, tremors.   OT comments  This 78 yo male admitted with above presents to acute OT with making progress with grooming (did this EOB today instead of bed level). Decreased proprioception especially with dual task (ie: sitting and washing face)--leans to right. He has minimal movement of RUE (more distal than proximal) but does not attend to RUE unless cued to do so. He will continue to benefit from acute OT with follow up OT at SNF.  Follow Up Recommendations  SNF;Supervision/Assistance - 24 hour    Equipment Recommendations  Other (comment)(TBD next venue)       Precautions / Restrictions Precautions Precautions: Fall Required Braces or Orthoses: Other Brace Other Brace: PRAFO boots bil Restrictions Weight Bearing Restrictions: No       Mobility Bed Mobility Overal bed mobility: Needs Assistance Bed Mobility: Supine to Sit Rolling: Max assist Sidelying to sit: Max assist;+2 for physical assistance Supine to sit: +2 for physical assistance;Max assist Sit to supine: +2 for physical assistance;Total assist Sit to sidelying: +2 for physical assistance;+2 for safety/equipment;Total assist General bed mobility comments: VCs for sequencing, A to move RLE and RUE, A to get legs totally off bed and raise trunk with HOB up  Transfers Overall transfer level: Needs assistance Equipment used: 2 person hand held assist(wrap around support with gait belt and chuck pad) Transfers: Sit to/from  Stand Sit to Stand: Max assist;Total assist;+2 physical assistance;From elevated surface         General transfer comment: use of gait belt and chuck pad with BLE blocked out to attempt standing    Balance Overall balance assessment: Needs assistance Sitting-balance support: Single extremity supported Sitting balance-Leahy Scale: Poor Sitting balance - Comments: right lateral lean Postural control: Posterior lean   Standing balance-Leahy Scale: Zero                             ADL either performed or assessed with clinical judgement   ADL Overall ADL's : Needs assistance/impaired     Grooming: Wash/dry face;Oral care;Sitting Grooming Details (indicate cue type and reason): EOB, pt able to wash face with LUE with min guard A and brush teeth with LUE with Mod A; however the more he did each activity the more he leaned to right (VCs to self correct)                                     Vision Baseline Vision/History: Wears glasses Wears Glasses: Reading only Additional Comments: Pt looking left and right (head and eyes both moving) with VCs, but tend to look left most of time          Cognition Arousal/Alertness: Awake/alert Behavior During Therapy: WFL for tasks assessed/performed Overall Cognitive Status: Impaired/Different from baseline Area of Impairment: Orientation;Attention;Following commands;Safety/judgement;Problem solving                 Orientation Level:  Place;Time Current Attention Level: Sustained Memory: Decreased recall of precautions;Decreased short-term memory Following Commands: Follows one step commands inconsistently;Follows one step commands with increased time Safety/Judgement: Decreased awareness of safety;Decreased awareness of deficits Awareness: Intellectual Problem Solving: Slow processing;Decreased initiation;Difficulty sequencing;Requires verbal cues;Requires tactile cues General Comments: Pt stating he is in  [place]. Pt requiring Max cues.         Exercises General Exercises - Lower Extremity Ankle Circles/Pumps: PROM;Right;10 reps Heel Slides: PROM;Right;10 reps           Pertinent Vitals/ Pain       Pain Assessment: Faces Faces Pain Scale: Hurts a little bit Pain Location: discomfot with ROM and right hand movement Pain Descriptors / Indicators: Grimacing Pain Intervention(s): Limited activity within patient's tolerance;Monitored during session;Repositioned         Frequency  Min 2X/week        Progress Toward Goals  OT Goals(current goals can now be found in the care plan section)  Progress towards OT goals: Progressing toward goals     Plan Discharge plan remains appropriate    Co-evaluation    PT/OT/SLP Co-Evaluation/Treatment: Yes Reason for Co-Treatment: Complexity of the patient's impairments (multi-system involvement);For patient/therapist safety PT goals addressed during session: Mobility/safety with mobility OT goals addressed during session: Strengthening/ROM;ADL's and self-care      AM-PAC OT "6 Clicks" Daily Activity     Outcome Measure   Help from another person eating meals?: A Lot Help from another person taking care of personal grooming?: A Lot Help from another person toileting, which includes using toliet, bedpan, or urinal?: Total Help from another person bathing (including washing, rinsing, drying)?: A Lot Help from another person to put on and taking off regular upper body clothing?: A Lot Help from another person to put on and taking off regular lower body clothing?: Total 6 Click Score: 10    End of Session Equipment Utilized During Treatment: Gait belt  OT Visit Diagnosis: Unsteadiness on feet (R26.81);Other abnormalities of gait and mobility (R26.89);Muscle weakness (generalized) (M62.81);Other symptoms and signs involving the nervous system (R29.898);Other symptoms and signs involving cognitive function;Hemiplegia and  hemiparesis Hemiplegia - Right/Left: Right Hemiplegia - dominant/non-dominant: Dominant Hemiplegia - caused by: Cerebral infarction   Activity Tolerance Patient tolerated treatment well   Patient Left in bed;with call bell/phone within reach;with bed alarm set   Nurse Communication (pt needed condom cath replaced)        Time: 0981-1914 OT Time Calculation (min): 27 min  Charges: OT General Charges $OT Visit: 1 Visit OT Treatments $Self Care/Home Management : 8-22 mins  Ignacia Palma, OTR/L Acute [employer] Pager [phone] Office [phone]        Evette Georges 09/23/2018, 10:44 AM

## 2018-09-23 NOTE — Clinical Social Work Note (Signed)
Clinical Social Work Assessment  Patient Details  Name: Darren Frazier MRN: [id number] Date of Birth: 1941-08-26  Date of referral:  09/23/18               Reason for consult:  Discharge Planning                Permission sought to share information with:  Facility Medical sales representative, Family Supports Permission granted to share information::     Name::     Darren Frazier  Agency::  [facility] SN[facility]  Relationship::  Wife  Contact Information:  [phone]  Housing/Transportation Living arrangements for the past 2 months:  Single Family Home Source of Information:  Medical Team, Facility, Spouse Patient Interpreter Needed:  None Criminal Activity/Legal Involvement Pertinent to Current Situation/Hospitalization:  No - Comment as needed Significant Relationships:  Spouse, Adult Children Lives with:  Spouse Do you feel safe going back to the place where you live?  Yes Need for family participation in patient care:  Yes (Comment)  Care giving concerns:  Patient is a short-term rehab resident at [facility]. PT recommending continued rehab once medically stable for discharge.   Social Worker assessment / plan:  Patient only oriented to self. No supports at bedside. CSW called wife, introduced role, and explained that PT recommendations would be discussed. She confirmed he was at [facility] for one day before being readmitted. She would like for him to return at discharge. [facility] admissions coordinator aware and will start insurance authorization. This will take 1-2 days to obtain. Mittens were discontinued today. Patient will have to be without any restraints for 24 hours prior to discharge. No further concerns. CSW encouraged patient's wife to contact CSW as needed. CSW will continue to follow patient and his wife for support and facilitate discharge back to SNF once medically stable.  Employment status:  Retired Database administrator PT  Recommendations:  Skilled Nursing Facility Information / Referral to community resources:  Skilled Nursing Facility  Patient/Family's Response to care:  Patient only oriented to self. Patient's wife agreeable to return to SNF. Patient's family supportive and involved in patient's care. Patient's wife appreciated social work intervention.  Patient/Family's Understanding of and Emotional Response to Diagnosis, Current Treatment, and Prognosis:  Patient only oriented to self. Patient's wife has a good understanding of the reason for admission and plan to return to SNF at discharge. Patient's wife appears happy with hospital care.  Emotional Assessment Appearance:  Appears stated age Attitude/Demeanor/Rapport:  Unable to Assess Affect (typically observed):  Unable to Assess Orientation:  Oriented to Self Alcohol / Substance use:  Never Used Psych involvement (Current and /or in the community):  No (Comment)  Discharge Needs  Concerns to be addressed:  Care Coordination Readmission within the last 30 days:  Yes Current discharge risk:  Dependent with Mobility, Cognitively Impaired Barriers to Discharge:  Continued Medical Work up, Other(Mittens discontinued today.)   Margarito Liner, LCSW 09/23/2018, 2:15 PM

## 2018-09-24 DIAGNOSIS — I10 Essential (primary) hypertension: Secondary | ICD-10-CM

## 2018-09-24 LAB — BASIC METABOLIC PANEL
Anion gap: 9 (ref 5–15)
BUN: 13 mg/dL (ref 8–23)
CHLORIDE: 100 mmol/L (ref 98–111)
CO2: 28 mmol/L (ref 22–32)
Calcium: 9.2 mg/dL (ref 8.9–10.3)
Creatinine, Ser: 1.17 mg/dL (ref 0.61–1.24)
GFR calc Af Amer: >60 mL/min (ref >60)
GFR calc non Af Amer: 60 mL/min — ABNORMAL LOW (ref >60)
Glucose, Bld: 188 mg/dL — ABNORMAL HIGH (ref 70–99)
POTASSIUM: 4 mmol/L (ref 3.5–5.1)
Sodium: 137 mmol/L (ref 135–145)

## 2018-09-24 MED ORDER — METOPROLOL TARTRATE 50 MG PO TABS
50.0000 mg | ORAL_TABLET | Freq: Two times a day (BID) | ORAL | Status: DC
  Administered 2018-09-24 – 2018-09-30 (×13): 50 mg via ORAL
  Filled 2018-09-24 (×13): qty 1

## 2018-09-24 MED ORDER — ORAL CARE MOUTH RINSE
15.0000 mL | Freq: Two times a day (BID) | OROMUCOSAL | Status: DC
  Administered 2018-09-24 – 2018-09-30 (×12): 15 mL via OROMUCOSAL

## 2018-09-24 MED ORDER — METOPROLOL TARTRATE 25 MG PO TABS: 25.0000 mg | ORAL_TABLET | Freq: Two times a day (BID) | ORAL | Status: DC

## 2018-09-24 NOTE — Progress Notes (Signed)
Speech-Language Pathology Note:  New order received for swallow evaluation as pt had coughing during lunch meal. Discussed this further with RN who explained that delayed coughing was noted at lunch but that no overt difficulties were noted during breakfast meal.   Given recent CVA, respiratory difficulties, and intermittent coughing that has been associated with mealtimes, I think we should pursue instrumental testing to better assess oropharyngeal function. This is likely to be completed on next date at the earliest.   In the interim, would allow current diet with RN supervision for aspiration precautions given that most of his meals have been consumed without overt difficulty. However, should any additional signs of possible aspiration be observed, would hold POs until MBS can be completed.   Natalia Leatherwood, M.A. CCC-SLP Acute Herbalist [phone] Office [phone]

## 2018-09-24 NOTE — Progress Notes (Addendum)
PROGRESS NOTE    Darren Frazier  IOE:[id number] DOB: Feb 05, 1941 DOA: 09/17/2018 PCP: Lynnea Ferrier, MD    Brief Narrative:  78 year old male who presented with respiratory distress.  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  Patient was discharged to rehab facility, where he was found in severe hypoxic respiratory failure, transferred back to the emergency department where he was diagnosed with acute pulmonary edema and required intubation and invasive mechanical ventilation.   He was extubated 3 days later January 17 and transferred to hospitalist team January 19.   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  -answers questions  -follows commands -? 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  -Persistent right sided weakness and slow speech.  -Continue medical therapy with antiplatelet therapy and ezetimbide, continue blood pressure and glucose control.  -Will need snf at discharge, patient is medically stable.  -DYS 1 diet thin liquids  Paroxysmal atrial fibrillation.  Rate controlled with metoprolol and amiodarone.  (last admission metoprolol had been decreased to 25 mg for pauses-- here increased back due to increased rate)-- pauses <2.5 on tele here Continue anticoagulation with apixaban  T2DM.  -SSI  HTN.  -Metoprolol for blood pressure control.    Hypothyroid.  -On Levothyroxine per home regimen.   AKI on CKD stage 3. -PRN follow up  Obesity Estimated body mass  index is 38.8 kg/m as calculated from the following:   Height as of this encounter: 5\' 11"  (1.803 m).   Weight as of this encounter: 126.2 kg.  DVT prophylaxis: eliquis  Code Status: full Family Communication: no family at bedside  Disposition Plan/ discharge barriers: waiting for placement.    Malnutrition Type:  Nutrition Problem: Inadequate oral intake Etiology: dysphagia   Malnutrition Characteristics:  Signs/Symptoms: per patient/family report   Nutrition Interventions:  Interventions: Magic cup, MVI, Glucerna shake         Subjective: C/o cramping all over  Objective: Vitals:   09/23/18 1200 09/23/18 1619 09/23/18 2100 09/24/18 0413  BP:   134/76 (!) 148/75  Pulse:   84 81  Resp: 18  16 15   Temp: 98.8 F (37.1 C) (!) 96.7 F (35.9 C) 98.9 F (37.2 C) 97.7 F (36.5 C)  TempSrc: Oral Axillary Axillary Oral  SpO2: 96%  96% 98%  Weight:    126.2 kg  Height:        Intake/Output Summary (Last 24 hours) at 09/24/2018 0803 Last data filed at 09/24/2018 0416 Gross per 24 hour  Intake 304 ml  Output 675 ml  Net -371 ml   Filed Weights   09/20/18 0413 09/22/18 0500 09/24/18 0413  Weight: 123.4 kg 128.1 kg 126.2 kg    Examination:   General: in bed, frail appearing but no distress Neurology: awake, alert, right side weakness E ENT: lips/mucosa dry Cardiovascular: irregular Pulmonary: moving air in all fields Gastrointestinal. Obese, +BS, soft      Data Reviewed: I have personally reviewed following labs and imaging studies  CBC: Recent Labs  Lab 09/17/18 1938 09/17/18 1946 09/18/18 0734 09/19/18 0431 09/20/18 0402 09/22/18 0433  WBC 12.3*  --  12.9* 10.0 9.8 8.4  NEUTROABS 9.6*  --   --   --   --   --   HGB 12.1* 13.6 12.2* 10.3* 11.0* 10.9*  HCT 40.2 40.0 39.1 33.3* 35.5* 36.0*  MCV 105.8*  --  100.0 100.9* 102.9* 98.9  PLT 358  --  365 313 338 287   Basic Metabolic Panel: Recent Labs  Lab 09/17/18 1938 09/17/18 1946  09/18/18 0241 09/18/18 0734 09/19/18 0431 09/20/18 0402 09/22/18 0433  NA 141 139  --  140 140 143 140  K 4.8 5.2* 4.8 4.2 3.5 3.7 3.8  CL 104 100  --  98 98 99 100  CO2 28  --   --  27 31 33* 31  GLUCOSE 137* 143*  --  115* 159* 125* 148*  BUN 12 18  --  17 26* 23 15  CREATININE 1.14 1.20  --  1.51* 1.68* 1.18 1.21  CALCIUM 8.8*  --   --  9.4 9.0 9.3 9.2  MG  --   --  2.0 1.9 2.0 2.1  --   PHOS  --   --   --  3.0 3.6 3.7  --    GFR: Estimated Creatinine Clearance: 69.2 mL/min (by C-G formula based on SCr of 1.21 mg/dL). Liver Function Tests: Recent Labs  Lab 09/17/18 1938 09/22/18 0433  AST 36 30  ALT 28 22  ALKPHOS 81 82  BILITOT 0.7 0.7  PROT 7.1 6.6  ALBUMIN 2.7* 2.6*   Recent Labs  Lab 09/17/18 1938  LIPASE 49   Recent Labs  Lab 09/22/18 0433  AMMONIA 16   Coagulation Profile: Recent Labs  Lab 09/18/18 0241  INR 1.73   Cardiac Enzymes: No results for input(s): CKTOTAL, CKMB, CKMBINDEX, TROPONINI in the last 168 hours. BNP (last 3 results) No results for input(s): PROBNP in the last 8760 hours. HbA1C: No results for input(s): HGBA1C in the last 72 hours. CBG: Recent Labs  Lab 09/19/18 2325 09/20/18 0405 09/20/18 0733 09/20/18 1112 09/20/18 1641  GLUCAP 108* 102* 111* 120* 141*   Lipid Profile: No results for input(s): CHOL, HDL, LDLCALC, TRIG, CHOLHDL, LDLDIRECT in the last 72 hours. Thyroid Function Tests: No results for input(s): TSH, T4TOTAL, FREET4, T3FREE, THYROIDAB in the last 72 hours. Anemia Panel: Recent Labs    09/22/18 0433  VITAMINB12 566  FOLATE 18.7      Radiology Studies: I have reviewed all of the imaging during this hospital visit personally     Scheduled Meds: . amiodarone  200 mg Oral Daily  . apixaban  5 mg Oral BID  . Chlorhexidine Gluconate Cloth  6 each Topical Daily  . cholecalciferol  1,000 Units Oral Daily  . ezetimibe  10 mg Oral Daily  . feeding supplement (GLUCERNA SHAKE)  237 mL Oral TID BM  .  levothyroxine  100 mcg Oral QAC breakfast  . mouth rinse  15 mL Mouth Rinse BID  . metoprolol tartrate  50 mg Oral BID  . multivitamin with minerals  1 tablet Oral Daily  . QUEtiapine  75 mg Oral QHS  . sodium chloride flush  10-40 mL Intracatheter Q12H  . vitamin C  500 mg Oral Daily   Continuous Infusions:   LOS: 7 days        Joseph Art, DO [facility]

## 2018-09-24 NOTE — Progress Notes (Signed)
Full aspiration precautions maintained by RN during evening meal. No coughing during meal. Pt left sitting upright post meal. Coughing noted during this post meal period.

## 2018-09-25 ENCOUNTER — Inpatient Hospital Stay (HOSPITAL_COMMUNITY): Payer: Medicare HMO

## 2018-09-25 DIAGNOSIS — I5031 Acute diastolic (congestive) heart failure: Secondary | ICD-10-CM

## 2018-09-25 NOTE — Progress Notes (Addendum)
PROGRESS NOTE   Darren Frazier  VCB:[id number]    DOB: 03-25-1941    DOA: 09/17/2018  PCP: Lynnea Ferrier, MD   I have briefly reviewed patients previous medical records in [facility].  Brief Narrative:  78 year old married male with PMH of PAF, NICM with EF of 60 to 65%, AAA, OSA, HTN, HLD, DM 2, PAD, GERD, morbid obesity, hospitalized 08/27/2018- 09/17/2022 left MCA and ACA territory embolic stroke likely due to left ICA occlusion which could not be revascularized, Pradaxa was changed to Eliquis, also evaluated and treated for aspiration pneumonitis, right femoral artery pseudoaneurysm, prior to discharge he had occasional pauses of 2.5 seconds, evaluated by cardiology who reduced dose of metoprolol and amiodarone, after being discharged patient arrived to facility severely hypoxic and was not accepted.  Patient was brought in by EMS to [facility] ED where patient was endotracheally intubated and admitted to ICU by CCM.  He was extubated on January 17 and transferred to [facility] on 09/21/2018.   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  Suspected due to flash pulmonary edema of unclear etiology.  Hypoxia resolved.  Acute on chronic diastolic CHF TTE December 2019: LVEF 60-65%.  -2.7 L since admission.  Currently not on diuretics.  Has some bilateral upper extremity edema.  Acute encephalopathy CT head unremarkable.  EEG without evidence of seizure.  Remains on nightly Seroquel.  As per discussion with spouse couple days ago, appeared to have returned to his recent baseline.  Need  to review again with spouse.  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  No new recommendations per neurology.  Follow-up in stroke clinic in 4 weeks to be arranged by neurology.  Continue Eliquis and Zetia.  Dysphagia Dysphagia 1 diet as per SLP recommendations.  PAF with tacky/bradycardia syndrome Continue amiodarone 200 mg daily, apixaban, metoprolol 50 mg twice daily.  Rate controlled.  No pauses noted.  Although metoprolol dose had been reduced during last admission, increased dose again this admission due to tachycardia.  Type II DM Reasonable inpatient control.  Not on medications.  A1c 7.1 recently.  Hyperlipidemia Continue Zetia.  Essential hypertension Mildly uncontrolled at times.  Continue current regimen without change.  Monitor closely and adjust as needed.  Urinary stricture Foley catheter has been removed and reportedly voiding without difficulty.  Chronic macrocytic anemia Stable  Hypothyroid TSH elevated at 6.  Synthroid dose increased during this hospital stay.  Follow TSH and 4 to 6 weeks.  Acute kidney injury on stage III chronic kidney disease Resolved.  Obesity/Body mass index is 38.5 kg/m.  Right femoral artery pseudoaneurysm Evaluated during last hospitalization.  Outpatient follow-up with vascular surgery.   DVT prophylaxis: Eliquis Code Status: Full Family Communication: None at bedside Disposition: DC to SNF pending bed availability.   Consultants:  Neurology PCCM admitted and signed off.  Procedures:  Intubation and extubation Foley catheter-discontinued  Antimicrobials:  None.   Subjective: Interviewed and examined with RN.  RN reported that his mental status has improved over the last couple of days.  Oriented  but still slow to respond.  Tolerated diet.  Patient denies complaints.  Denies pain or dyspnea.  Getting ready to work with PT.  ROS: As above, otherwise  negative.  Objective:  Vitals:   09/25/18 0332 09/25/18 0745 09/25/18 0934 09/25/18 1205  BP: (!) 145/74 (!) 133/120 124/88 134/64  Pulse: 82 82 62 82  Resp: (!) 28     Temp: 98.3 F (36.8 C) 98.2 F (36.8 C)  (!) 97.5 F (36.4 C)  TempSrc: Oral Oral  Oral  SpO2: 100% 96%  98%  Weight: 125.2 kg     Height:        Examination:  General exam: Pleasant elderly male, moderately built and obese sitting up comfortably in bed. Respiratory system: Clear to auscultation. Respiratory effort normal. Cardiovascular system: S1 & S2 heard, RRR. No JVD, murmurs, rubs, gallops or clicks. No pedal edema.  Gastrointestinal system: Abdomen is nondistended, soft and nontender. No organomegaly or masses felt. Normal bowel sounds heard. Central nervous system: Alert and oriented to person, place and partly to time. No focal neurological deficits. Extremities: Right upper extremity in sling, grade 3 x 5 proximally and 4 x 5 distally.  Right lower extremity grade 2 x 5 power.  Left limbs grade 5 x 5 power. Skin: No rashes, lesions or ulcers Psychiatry: Judgement and insight appear impaired. Mood & affect pleasant & appropriate.     Data Reviewed: I have personally reviewed following labs and imaging studies  CBC: Recent Labs  Lab 09/19/18 0431 09/20/18 0402 09/22/18 0433  WBC 10.0 9.8 8.4  HGB 10.3* 11.0* 10.9*  HCT 33.3* 35.5* 36.0*  MCV 100.9* 102.9* 98.9  PLT 313 338 287   Basic Metabolic Panel: Recent Labs  Lab 09/19/18 0431 09/20/18 0402 09/22/18 0433 09/24/18 0924  NA 140 143 140 137  K 3.5 3.7 3.8 4.0  CL 98 99 100 100  CO2 31 33* 31 28  GLUCOSE 159* 125* 148* 188*  BUN 26* 23 15 13   CREATININE 1.68* 1.18 1.21 1.17  CALCIUM 9.0 9.3 9.2 9.2  MG 2.0 2.1  --   --   PHOS 3.6 3.7  --   --    Liver Function Tests: Recent Labs  Lab 09/22/18 0433  AST 30  ALT 22  ALKPHOS 82  BILITOT 0.7  PROT 6.6  ALBUMIN 2.6*   CBG: Recent Labs  Lab 09/19/18 2325 09/20/18 0405  09/20/18 0733 09/20/18 1112 09/20/18 1641  GLUCAP 108* 102* 111* 120* 141*    Recent Results (from the past 240 hour(s))  Urine Culture     Status: None   Collection Time: 09/15/18  4:25 PM  Result Value Ref Range Status   Specimen Description URINE, CLEAN CATCH  Final   Special Requests   Final    NONE Performed at [facility], [address] [place], [place] [address]    Culture   Final    Multiple bacterial morphotypes present, none predominant. Suggest appropriate recollection if clinically indicated.   Report Status 09/16/2018 FINAL  Final  Urine culture     Status: Abnormal   Collection Time: 09/17/18  8:19 PM  Result Value Ref Range Status   Specimen Description URINE, CATHETERIZED  Final   Special Requests NONE  Final   Culture (A)  Final    <10,000 COLONIES/mL INSIGNIFICANT GROWTH Performed at [facility], [address], [place], [place] [address]    Report Status 09/19/2018 FINAL  Final  Culture, blood (  routine x 2)     Status: None   Collection Time: 09/17/18  8:36 PM  Result Value Ref Range Status   Specimen Description BLOOD BLOOD LEFT FOREARM  Final   Special Requests   Final    BOTTLES DRAWN AEROBIC AND ANAEROBIC Blood Culture results may not be optimal due to an inadequate volume of blood received in culture bottles   Culture   Final    NO GROWTH 5 DAYS Performed at [facility], [address] [place], [place] [address]    Report Status 09/22/2018 FINAL  Final  Culture, blood (routine x 2)     Status: None   Collection Time: 09/18/18  2:39 AM  Result Value Ref Range Status   Specimen Description BLOOD LEFT FOOT  Final   Special Requests   Final    BOTTLES DRAWN AEROBIC ONLY Blood Culture results may not be optimal due to an inadequate volume of blood received in culture bottles   Culture   Final    NO GROWTH 5 DAYS Performed at [facility], [address] [place], [place] [address]    Report Status 09/23/2018 FINAL   Final         Radiology Studies: No results found.      Scheduled Meds: . amiodarone  200 mg Oral Daily  . apixaban  5 mg Oral BID  . cholecalciferol  1,000 Units Oral Daily  . ezetimibe  10 mg Oral Daily  . feeding supplement (GLUCERNA SHAKE)  237 mL Oral TID BM  . levothyroxine  100 mcg Oral QAC breakfast  . mouth rinse  15 mL Mouth Rinse BID  . metoprolol tartrate  50 mg Oral BID  . multivitamin with minerals  1 tablet Oral Daily  . QUEtiapine  75 mg Oral QHS  . sodium chloride flush  10-40 mL Intracatheter Q12H  . vitamin C  500 mg Oral Daily   Continuous Infusions:   LOS: 8 days     Marcellus ScottAnand Abdinasir Spadafore, MD, FACP, [facility]. [facility]  To contact the attending provider between 7A-7P or the covering provider during after hours 7P-7A, please log into the web site [email or web address] and access using universal [place] password for that web site. If you do not have the password, please call the hospital operator.  09/25/2018, 2:44 PM

## 2018-09-25 NOTE — Clinical Social Work Note (Signed)
Insurance authorization still pending.  Caela Huot, CSW [phone]  

## 2018-09-25 NOTE — Progress Notes (Signed)
Physical Therapy Treatment Patient Details Name: Darren Frazier MRN: [id number] DOB: 08/31/1941 Today's Date: 09/25/2018    History of Present Illness Pt is a 78 y/o male who was discharged on 09/17/18 after hospitalization 12/25 -1/15 for acute stroke. Pt was refused at rehab facility due to hypoxia. He required intubation for airway protection on arrival to ER (intubated 1/15-1/17).  Head CT neg.  CXR c/w pulmonary edema. PMH including but not limited to recent L MCA/ACA stroke, DM, HTN, PVD, tremors.    PT Comments    Pt making slow, steady progress. Continue to recommend SNF.    Follow Up Recommendations  SNF     Equipment Recommendations  None recommended by PT    Recommendations for Other Services       Precautions / Restrictions Precautions Precautions: Fall Required Braces or Orthoses: Other Brace Other Brace: PRAFO boots bil Restrictions Weight Bearing Restrictions: No    Mobility  Bed Mobility Overal bed mobility: Needs Assistance Bed Mobility: Supine to Sit;Sit to Supine     Supine to sit: +2 for physical assistance;Max assist Sit to supine: +2 for physical assistance;Total assist   General bed mobility comments: Assist to bring RLE off of bed, elevate trunk into sitting and bring hips to EOB. Assist to lower trunk and bring legs back up into bed when returning to supine.  Transfers                 General transfer comment: Brought Stedy to pt but pt unable to maintain adequate trunk control to attempt sit to stand  Ambulation/Gait                 Stairs             Wheelchair Mobility    Modified Rankin (Stroke Patients Only) Modified Rankin (Stroke Patients Only) Pre-Morbid Rankin Score: No significant disability Modified Rankin: Severe disability     Balance Overall balance assessment: Needs assistance Sitting-balance support: Single extremity supported Sitting balance-Leahy Scale: Poor Sitting balance - Comments:  Sat EOB x 12 minutes with max to min guard assist. Pt leaning heavily rt and stating he feels like he is falling to the rt when trunk placed in midline. Had pt prop on lt elbow and then when returned to upright able to sit with min guard for several minutes. When brought Stedy to pt he began again leaning heavily rt and posteriorly Postural control: Right lateral lean                                  Cognition Arousal/Alertness: Awake/alert Behavior During Therapy: WFL for tasks assessed/performed Overall Cognitive Status: Impaired/Different from baseline Area of Impairment: Orientation;Attention;Memory;Following commands;Safety/judgement;Awareness;Problem solving                 Orientation Level: Disoriented to;Time;Situation Current Attention Level: Focused Memory: Decreased recall of precautions;Decreased short-term memory Following Commands: Follows one step commands with increased time Safety/Judgement: Decreased awareness of deficits;Decreased awareness of safety Awareness: Intellectual Problem Solving: Slow processing;Decreased initiation;Difficulty sequencing;Requires verbal cues;Requires tactile cues        Exercises      General Comments        Pertinent Vitals/Pain Pain Assessment: Faces Faces Pain Scale: Hurts little more Pain Location: generalized Pain Descriptors / Indicators: Grimacing Pain Intervention(s): Limited activity within patient's tolerance;Repositioned    Home Living  Prior Function            PT Goals (current goals can now be found in the care plan section) Progress towards PT goals: Progressing toward goals    Frequency    Min 3X/week      PT Plan Current plan remains appropriate    Co-evaluation              AM-PAC PT "6 Clicks" Mobility   Outcome Measure  Help needed turning from your back to your side while in a flat bed without using bedrails?: Total Help needed moving  from lying on your back to sitting on the side of a flat bed without using bedrails?: Total Help needed moving to and from a bed to a chair (including a wheelchair)?: Total Help needed standing up from a chair using your arms (e.g., wheelchair or bedside chair)?: Total Help needed to walk in hospital room?: Total Help needed climbing 3-5 steps with a railing? : Total 6 Click Score: 6    End of Session Equipment Utilized During Treatment: Oxygen;Gait belt Activity Tolerance: Patient limited by fatigue Patient left: in bed;with call bell/phone within reach;with bed alarm set;with SCD's reapplied;with family/visitor present Nurse Communication: Mobility status;Need for lift equipment PT Visit Diagnosis: Other abnormalities of gait and mobility (R26.89);Muscle weakness (generalized) (M62.81);Other symptoms and signs involving the nervous system (R29.898);Hemiplegia and hemiparesis Hemiplegia - Right/Left: Right Hemiplegia - dominant/non-dominant: Dominant Hemiplegia - caused by: Cerebral infarction     Time: 2902-1115 PT Time Calculation (min) (ACUTE ONLY): 21 min  Charges:  $Neuromuscular Re-education: 8-22 mins                     [facility] PT Acute Rehabilitation Services Pager [phone] Office [phone]    Angelina Ok [facility] 09/25/2018, 1:41 PM

## 2018-09-25 NOTE — Progress Notes (Signed)
Pt left for radiology department for barium swallow, accompanied by transporter.

## 2018-09-25 NOTE — Progress Notes (Signed)
Modified Barium Swallow Progress Note  Patient Details  Name: Darren Frazier MRN: [id number] Date of Birth: 06-28-1941  Today's Date: 09/25/2018  Modified Barium Swallow completed.  Full report located under Chart Review in the Imaging Section.  Brief recommendations include the following:  Clinical Impression  Pt has a mild oropharyngeal dysphagia that appears to be primarily cognitively-based, although he does have mildly reduced base of tongue retraction. He has intermittent oral holding, for which he required Min verbal cues. Trace vallecular residue was noted intermittently, but with mild residue present with thin liquids via straw, as he also had piecemeal swallowing, and appeared to have even less base of tongue retraction as he tried to contain thin liquids in his oral cavity simultaneously as he swallowed. He cleared pharyngeal residuals well as he swallowed what remained in his oral cavity. Of note, pt did have intermittent coughing and throat clearing across the study without any aspiration or penetration occurring. Attempted to perform a brief esophageal scan with limited visibility, although there appeared to be only a trace amount of barium that remained in the distal esophagus (MD not present to confirm). Recommend continuing Dys 1 diet and thin liquids with good prognosis to advance solids clinically pending further improvements in mentation.   Swallow Evaluation Recommendations       SLP Diet Recommendations: Dysphagia 1 (Puree) solids;Thin liquid   Liquid Administration via: Cup;Straw   Medication Administration: Whole meds with puree   Supervision: Staff to assist with self feeding;Full supervision/cueing for compensatory strategies   Compensations: Slow rate;Small sips/bites;Minimize environmental distractions   Postural Changes: Seated upright at 90 degrees;Remain semi-upright after after feeds/meals (Comment)   Oral Care Recommendations: Oral care BID         Virl Axe Antonia Jicha 09/25/2018,11:12 AM   Natalia Leatherwood, M.A. CCC-SLP Acute Herbalist [phone] Office [phone]

## 2018-09-26 MED ORDER — PRO-STAT SUGAR FREE PO LIQD
30.0000 mL | Freq: Every day | ORAL | Status: DC
  Administered 2018-09-26 – 2018-09-30 (×5): 30 mL via ORAL
  Filled 2018-09-26 (×5): qty 30

## 2018-09-26 MED ORDER — PRO-STAT SUGAR FREE PO LIQD: 30.0000 mL | Freq: Two times a day (BID) | ORAL | Status: DC

## 2018-09-26 NOTE — Progress Notes (Signed)
PROGRESS NOTE   Darren Frazier  BMW:[id number]    DOB: 08/05/1941    DOA: 09/17/2018  PCP: Lynnea Ferrier, MD   I have briefly reviewed patients previous medical records in [facility].  Brief Narrative:  78 year old married male with PMH of PAF, NICM with EF of 60 to 65%, AAA, OSA, HTN, HLD, DM 2, PAD, GERD, morbid obesity, hospitalized 08/27/2018- 09/17/2022 left MCA and ACA territory embolic stroke likely due to left ICA occlusion which could not be revascularized, Pradaxa was changed to Eliquis, also evaluated and treated for aspiration pneumonitis, right femoral artery pseudoaneurysm, prior to discharge he had occasional pauses of 2.5 seconds, evaluated by cardiology who reduced dose of metoprolol and amiodarone, after being discharged patient arrived to facility severely hypoxic and was not accepted.  Patient was brought in by EMS to [facility] ED where patient was endotracheally intubated and admitted to ICU by CCM.  He was extubated on January 17 and transferred to [facility] on 09/21/2018.  Medically stable for discharge to SNF pending insurance approval.   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  Suspected due to flash pulmonary edema of unclear etiology.  Hypoxia resolved.  Stable on room air.  Acute on chronic diastolic CHF TTE December 2019: LVEF 60-65%.  -2 L since admission.  Currently not on diuretics.  Has some bilateral upper extremity edema, more so on right upper extremity, likely related to right hemiparesis from stroke.  Acute encephalopathy CT head  unremarkable.  EEG without evidence of seizure.  Remains on nightly Seroquel.  As per discussion with spouse couple days ago, appeared to have returned to his recent baseline.  Suspect has resolved.  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  No new recommendations per neurology.  Follow-up in stroke clinic in 4 weeks to be arranged by neurology.  Continue Eliquis and Zetia.  Dysphagia Dysphagia 1 diet as per SLP recommendations.  As per RN, tolerating diet.  PAF with tacky/bradycardia syndrome Continue amiodarone 200 mg daily, apixaban, metoprolol 50 mg twice daily.  Rate controlled.  No pauses noted.  Although metoprolol dose had been reduced during last admission, increased dose again this admission due to tachycardia.  Stable.   Type II DM Had reasonable inpatient control.  Not on medications here.  A1c 7.1 recently.  Hyperlipidemia Continue Zetia.  Essential hypertension Controlled today.  Continue current regimen without change.  Monitor closely and adjust as needed.  Urinary stricture Foley catheter has been removed and reportedly voiding without difficulty.  Chronic macrocytic anemia Stable  Hypothyroid TSH elevated at 6.  Synthroid dose increased during this hospital stay.  Follow TSH and 4 to 6 weeks.  Acute kidney injury on stage III chronic kidney disease Resolved.  Obesity/Body mass index is 36.37 kg/m.  Right femoral artery pseudoaneurysm Evaluated during last hospitalization.  Outpatient follow-up with vascular surgery.   DVT prophylaxis: Eliquis Code Status: Full Family Communication: None at bedside Disposition: DC to SNF pending insurance approval   Consultants:  Neurology PCCM admitted and signed off.  Procedures:  Intubation and extubation Foley catheter-discontinued  Antimicrobials:  None.   Subjective: Patient denies complaints.  Denied dyspnea or chest pain.  States that his spouse may have been in to  visit him yesterday.  Still having right-sided weakness.  ROS: As above, otherwise negative.  Objective:  Vitals:   09/25/18 1929 09/25/18 2145 09/26/18 0349 09/26/18 0958  BP: 132/70  (!) 150/77 135/74  Pulse: 71 84  60  Resp:      Temp: 97.9 F (36.6 C)  98 F (36.7 C) (!) 97.5 F (36.4 C)  TempSrc: Oral  Oral   SpO2: 94%   100%  Weight:   118.3 kg   Height:        Examination:  General exam: Pleasant elderly male, moderately built and obese lying comfortably propped up in bed. Respiratory system: Clear to auscultation. Respiratory effort normal.  Stable Cardiovascular system: S1 & S2 heard, RRR. No JVD, murmurs, rubs, gallops or clicks. No pedal edema.  Stable Gastrointestinal system: Abdomen is nondistended, soft and nontender. No organomegaly or masses felt. Normal bowel sounds heard.  Stable Central nervous system: Alert and oriented to person, place and partly to time. No focal neurological deficits.  Extremities: Right upper extremity in sling, grade 3 x 5 proximally and 4 x 5 distally.  Right lower extremity grade 2 x 5 power.  Left limbs grade 5 x 5 power. Skin: No rashes, lesions or ulcers Psychiatry: Judgement and insight appear impaired. Mood & affect pleasant & appropriate.     Data Reviewed: I have personally reviewed following labs and imaging studies  CBC: Recent Labs  Lab 09/20/18 0402 09/22/18 0433  WBC 9.8 8.4  HGB 11.0* 10.9*  HCT 35.5* 36.0*  MCV 102.9* 98.9  PLT 338 287   Basic Metabolic Panel: Recent Labs  Lab 09/20/18 0402 09/22/18 0433 09/24/18 0924  NA 143 140 137  K 3.7 3.8 4.0  CL 99 100 100  CO2 33* 31 28  GLUCOSE 125* 148* 188*  BUN 23 15 13   CREATININE 1.18 1.21 1.17  CALCIUM 9.3 9.2 9.2  MG 2.1  --   --   PHOS 3.7  --   --    Liver Function Tests: Recent Labs  Lab 09/22/18 0433  AST 30  ALT 22  ALKPHOS 82  BILITOT 0.7  PROT 6.6  ALBUMIN 2.6*   CBG: Recent Labs  Lab 09/19/18 2325 09/20/18 0405  09/20/18 0733 09/20/18 1112 09/20/18 1641  GLUCAP 108* 102* 111* 120* 141*    Recent Results (from the past 240 hour(s))  Urine culture     Status: Abnormal   Collection Time: 09/17/18  8:19 PM  Result Value Ref Range Status   Specimen Description URINE, CATHETERIZED  Final   Special Requests NONE  Final   Culture (A)  Final    <10,000 COLONIES/mL INSIGNIFICANT GROWTH Performed at [facility], [address], [place], [place] [address]    Report Status 09/19/2018 FINAL  Final  Culture, blood (routine x 2)     Status: None   Collection Time: 09/17/18  8:36 PM  Result Value Ref Range Status   Specimen Description BLOOD BLOOD LEFT FOREARM  Final   Special Requests   Final    BOTTLES DRAWN AEROBIC AND ANAEROBIC Blood Culture results may not be optimal due to an inadequate volume of blood received in culture bottles   Culture   Final    NO GROWTH 5 DAYS Performed at [facility],  [address], [place], [place] [address]    Report Status 09/22/2018 FINAL  Final  Culture, blood (routine x 2)     Status: None   Collection Time: 09/18/18  2:39 AM  Result Value Ref Range Status   Specimen Description BLOOD LEFT FOOT  Final   Special Requests   Final    BOTTLES DRAWN AEROBIC ONLY Blood Culture results may not be optimal due to an inadequate volume of blood received in culture bottles   Culture   Final    NO GROWTH 5 DAYS Performed at [facility], [address] [place], [place] [address]    Report Status 09/23/2018 FINAL  Final         Radiology Studies: No results found.      Scheduled Meds: . amiodarone  200 mg Oral Daily  . apixaban  5 mg Oral BID  . cholecalciferol  1,000 Units Oral Daily  . ezetimibe  10 mg Oral Daily  . feeding supplement (GLUCERNA SHAKE)  237 mL Oral TID BM  . feeding supplement (PRO-STAT SUGAR FREE 64)  30 mL Oral Daily  . levothyroxine  100 mcg Oral QAC breakfast  . mouth rinse  15 mL Mouth Rinse BID  . metoprolol  tartrate  50 mg Oral BID  . multivitamin with minerals  1 tablet Oral Daily  . QUEtiapine  75 mg Oral QHS  . sodium chloride flush  10-40 mL Intracatheter Q12H  . vitamin C  500 mg Oral Daily   Continuous Infusions:   LOS: 9 days     Marcellus Scott, MD, FACP, [facility]. [facility]  To contact the attending provider between 7A-7P or the covering provider during after hours 7P-7A, please log into the web site [email or web address] and access using universal [place] password for that web site. If you do not have the password, please call the hospital operator.  09/26/2018, 12:28 PM

## 2018-09-26 NOTE — Clinical Social Work Note (Addendum)
Insurance authorization still pending.  Charlynn Court, CSW [phone]  3:49 pm Insurance authorization still pending.  Charlynn Court, CSW [phone]

## 2018-09-26 NOTE — Progress Notes (Signed)
Nutrition Follow-up  DOCUMENTATION CODES:   Obesity unspecified  INTERVENTION:   -ContinueGlucerna Shake po TID, each supplement provides 220 kcal and 10 grams of protein -Continue Magic Cup TID with meals, each supplement provides 290 kcals and 9 grams protein -Continue MVI with minerals daily -30 ml  Prtostat BID, each supplement provides 100 kcals and 15 grams protein  NUTRITION DIAGNOSIS:   Inadequate oral intake related to dysphagia as evidenced by per patient/family report.  Ongoing  GOAL:   Patient will meet greater than or equal to 90% of their needs  Progressing  MONITOR:   PO intake, Supplement acceptance, Diet advancement, Labs, Weight trends, Skin, I & O's  REASON FOR ASSESSMENT:   Consult Assessment of nutrition requirement/status  ASSESSMENT:   78 yo male with PMH of HTN, HLD, DM, dysrhythmia, stroke (Dec 2019), PVD, tremors, morbid obesity, vitamin D deficiency who was re-admitted 1/15 with hypoxia. Just discharged 1/15, but sent right back to the hospital with hypoxia requiring intubation.  1/16- PICC placed, TF initiated 1/17- extubated 1/18- s/p BSE- advanced to full liquids 1/19- repeat BSE- not ready to advancement to solids yet  1/20- advanced to dysphagia 1 diet with thin liquids 1/23- s/p MBSS- continue dysphagia 1 diet with thin liquids  Reviewed I/O's: +484 ml x 24 hours and -2.3 L since admission  Spoke with pt at bedside, who was slightly confused at time of visit. Pt was able to appropriately answer most questions, however, had difficulty expressing some thoughts to this RD (pt continued to complain he was uncomfortable, however, he was unable to provide further details to this RD regarding location). Pt reports that he did not eat breakfast and dinner "because I did not wake up in time for it". Noted variable meal intake; Po: 20-90%.   MBSS was re-done due to observation of pt choking on liquids. This was resolved when pt had feeding  assistance and aspiration precautions were followed. Pt denies any difficulty swallowing.   Discussed with pt importance of good meal and supplement intake to promote healing.   Labs reviewed: CBGS: 120-141.   NUTRITION - FOCUSED PHYSICAL EXAM:    Most Recent Value  Orbital Region  Mild depletion  Upper Arm Region  No depletion  Thoracic and Lumbar Region  No depletion  Buccal Region  No depletion  Temple Region  No depletion  Clavicle Bone Region  No depletion  Clavicle and Acromion Bone Region  No depletion  Scapular Bone Region  No depletion  Dorsal Hand  No depletion  Patellar Region  No depletion  Anterior Thigh Region  No depletion  Posterior Calf Region  No depletion  Edema (RD Assessment)  Mild  Hair  Reviewed  Eyes  Reviewed  Mouth  Reviewed  Skin  Reviewed  Nails  Reviewed       Diet Order:   Diet Order            DIET - DYS 1 Room service appropriate? Yes; Fluid consistency: Thin  Diet effective now              EDUCATION NEEDS:   Education needs have been addressed  Skin:  Skin Integrity Issues:: Other (Comment) Other: 2 non pressure wounds to R hand, puncture wound R abdomen  Last BM:  09/24/18  Height:   Ht Readings from Last 1 Encounters:  09/17/18 5\' 11"  (1.803 m)    Weight:   Wt Readings from Last 1 Encounters:  09/26/18 118.3 kg    Ideal  Body Weight:  78.2 kg  BMI:  Body mass index is 36.37 kg/m.  Estimated Nutritional Needs:   Kcal:  2150-2350  Protein:  115-130 grams  Fluid:  >2.1 L    Delesa Kawa A. Mayford Knife, RD, LDN, CDE Pager: [phone] After hours Pager: [phone]

## 2018-09-27 NOTE — Progress Notes (Signed)
Still waiting on insurance authorization.  CSW will continue to follow for disposition needs.  Budd Palmer LCSWA [phone]

## 2018-09-27 NOTE — Progress Notes (Signed)
PROGRESS NOTE   Darren Frazier  ZOX:[id number]    DOB: 08-14-1941    DOA: 09/17/2018  PCP: Lynnea FerrierKlein, Bert J III, MD   I have briefly reviewed patients [facility].  Brief Narrative:  78 year old married male with PMH of PAF, NICM with EF of 60 to 65%, AAA, OSA, HTN, HLD, DM 2, PAD, GERD, morbid obesity, hospitalized 08/27/2018- 09/17/2022 left MCA and ACA territory embolic stroke likely due to left ICA occlusion which could not be revascularized, Pradaxa was changed to Eliquis, also evaluated and treated for aspiration pneumonitis, right femoral artery pseudoaneurysm, prior to discharge he had occasional pauses of 2.5 seconds, evaluated by cardiology who reduced dose of metoprolol and amiodarone, after being discharged patient arrived to facility severely hypoxic and was not accepted.  Patient [facility] by EMS [facility] where patient was endotracheally intubated and admitted to ICU by CCM.  He was extubated on January 17 and [facility].  Medically stable for discharge to SNF pending insurance approval.   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  Suspected due to flash pulmonary edema of unclear etiology.  Hypoxia resolved.  Stable on room air.  Acute on chronic diastolic CHF TTE December 2019: LVEF 60-65%.  -2 L since admission.  Currently not on diuretics.  Has some bilateral upper extremity edema, more so on right upper extremity, likely related to right hemiparesis from stroke.  Acute encephalopathy CT head  unremarkable.  EEG without evidence of seizure.  Remains on nightly Seroquel.  I discussed with patient spouse in detail on 1/25.  She reports that his mental status significantly changed after procedure to revascularize?  12/25.  Since then patient mental status has waxed and waned without substantial improvement or worsening.  He may be about 50% better.  He has.'s of coherency and then at times appears confused.  Likely related to recent large left CVA,?  Some aphasia.  Continue to monitor.  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  No new recommendations per neurology.  Follow-up in stroke clinic in 4 weeks to be arranged by neurology.  Continue Eliquis and Zetia.  Dysphagia Dysphagia 1 diet as per SLP recommendations.  As per RN, tolerating diet.  PAF with tacky/bradycardia syndrome Continue amiodarone 200 mg daily, apixaban, metoprolol 50 mg twice daily.  Rate controlled.  No pauses noted.  Although metoprolol dose had been reduced during last admission, increased dose again this admission due to tachycardia.  Stable.   Type II DM Had reasonable inpatient control.  Not on medications here.  A1c 7.1 recently.  Hyperlipidemia Continue Zetia.  Essential hypertension Controlled today.  Continue current regimen without change.  Monitor closely and adjust as needed.  Urinary stricture Foley catheter has been removed and reportedly voiding without difficulty.  Chronic macrocytic anemia Stable  Hypothyroid TSH elevated at 6.  Synthroid dose increased during this hospital stay.  Follow TSH and 4 to 6 weeks.  Acute kidney injury on stage III chronic kidney disease Resolved.  Obesity/Body mass  index is 35.48 kg/m.  Right femoral artery pseudoaneurysm Evaluated during last hospitalization.  Outpatient follow-up with vascular surgery.   DVT prophylaxis: Eliquis Code Status: Full Family Communication: Discussed in detail with patient spouse on 1/25,  updated care and answered questions. Disposition: DC to SNF pending insurance approval   Consultants:  Neurology PCCM admitted and signed off.  Procedures:  Intubation and extubation Foley catheter-discontinued  Antimicrobials:  None.   Subjective: Patient alert and oriented to person and partly to time.  At times appears confused.  No acute issues reported by RN.  No dyspnea reported.  Mental status report per spouse as noted above.  ROS: As above, otherwise negative.  Objective:  Vitals:   09/27/18 0500 09/27/18 0724 09/27/18 1015 09/27/18 1202  BP:  117/85 (!) 159/97 104/76  Pulse:  (!) 59 85   Resp:      Temp:  97.8 F (36.6 C)  97.8 F (36.6 C)  TempSrc:  Oral  Oral  SpO2:  100% (!) 70%   Weight: 115.4 kg     Height:        Examination:  General exam: Pleasant elderly male, moderately built and obese lying comfortably propped up in bed.  Stable Respiratory system: Clear to auscultation.  No increased work of breathing. Cardiovascular system: S1 & S2 heard, RRR. No JVD, murmurs, rubs, gallops or clicks. No pedal edema.  Stable Gastrointestinal system: Abdomen is nondistended, soft and nontender. No organomegaly or masses felt. Normal bowel sounds heard.  Stable Central nervous system: Alert and oriented to person and partly to time.  Follows simple instructions.  No facial asymmetry or dysarthria.?  Aphasia. Extremities: Right upper extremity in sling, grade 2 x 5 proximally and 4 x 5 distally.  Right lower extremity grade 2 x 5 power.  Left limbs grade 5 x 5 power. Skin: No rashes, lesions or ulcers Psychiatry: Judgement and insight impaired. Mood & affect pleasant & appropriate.     Data Reviewed: I have personally reviewed following labs and imaging studies  CBC: Recent Labs  Lab 09/22/18 0433  WBC 8.4  HGB 10.9*  HCT 36.0*  MCV 98.9  PLT 287   Basic Metabolic Panel: Recent Labs  Lab 09/22/18 0433 09/24/18 0924  NA 140 137  K 3.8 4.0  CL 100  100  CO2 31 28  GLUCOSE 148* 188*  BUN 15 13  CREATININE 1.21 1.17  CALCIUM 9.2 9.2   Liver Function Tests: Recent Labs  Lab 09/22/18 0433  AST 30  ALT 22  ALKPHOS 82  BILITOT 0.7  PROT 6.6  ALBUMIN 2.6*   CBG: Recent Labs  Lab 09/20/18 1641  GLUCAP 141*    Recent Results (from the past 240 hour(s))  Urine culture     Status: Abnormal   Collection Time: 09/17/18  8:19 PM  Result Value Ref Range Status   Specimen Description URINE, CATHETERIZED  Final   Special Requests NONE  Final   Culture (A)  Final    <10,000 COLONIES/mL INSIGNIFICANT GROWTH Performed at [facility], [address] [place], [place] [address]    Report Status 09/19/2018 FINAL  Final  Culture, blood (routine x 2)     Status: None   Collection Time: 09/17/18  8:36 PM  Result Value Ref Range Status   Specimen Description BLOOD BLOOD LEFT FOREARM  Final   Special Requests   Final    BOTTLES DRAWN AEROBIC AND ANAEROBIC Blood Culture results may not be optimal due  to an inadequate volume of blood received in culture bottles   Culture   Final    NO GROWTH 5 DAYS Performed at [facility], [address], [place], [place] [address]    Report Status 09/22/2018 FINAL  Final  Culture, blood (routine x 2)     Status: None   Collection Time: 09/18/18  2:39 AM  Result Value Ref Range Status   Specimen Description BLOOD LEFT FOOT  Final   Special Requests   Final    BOTTLES DRAWN AEROBIC ONLY Blood Culture results may not be optimal due to an inadequate volume of blood received in culture bottles   Culture   Final    NO GROWTH 5 DAYS Performed at [facility], [address], [place], [place] [address]    Report Status 09/23/2018 FINAL  Final         Radiology Studies: No results found.      Scheduled Meds: . amiodarone  200 mg Oral Daily  . apixaban  5 mg Oral BID  . cholecalciferol  1,000 Units Oral Daily  . ezetimibe  10 mg Oral Daily  . feeding supplement  (GLUCERNA SHAKE)  237 mL Oral TID BM  . feeding supplement (PRO-STAT SUGAR FREE 64)  30 mL Oral Daily  . levothyroxine  100 mcg Oral QAC breakfast  . mouth rinse  15 mL Mouth Rinse BID  . metoprolol tartrate  50 mg Oral BID  . multivitamin with minerals  1 tablet Oral Daily  . QUEtiapine  75 mg Oral QHS  . sodium chloride flush  10-40 mL Intracatheter Q12H  . vitamin C  500 mg Oral Daily   Continuous Infusions:   LOS: 10 days     Marcellus Scott, MD, FACP, [facility]. [facility]  To contact the attending provider between 7A-7P or the covering provider during after hours 7P-7A, please log into the web site [email or web address] and access using universal [place] password for that web site. If you do not have the password, please call the hospital operator.  09/27/2018, 12:20 PM

## 2018-09-28 NOTE — Progress Notes (Signed)
PROGRESS NOTE   Darren Frazier  CHE:[id number]    DOB: 07-10-1941    DOA: 09/17/2018  PCP: Lynnea Ferrier, MD   I have briefly reviewed patients previous medical records in [facility].  Brief Narrative:  78 year old married male with PMH of PAF, NICM with EF of 60 to 65%, AAA, OSA, HTN, HLD, DM 2, PAD, GERD, morbid obesity, hospitalized 08/27/2018- 09/17/2022 left MCA and ACA territory embolic stroke likely due to left ICA occlusion which could not be revascularized, Pradaxa was changed to Eliquis, also evaluated and treated for aspiration pneumonitis, right femoral artery pseudoaneurysm, prior to discharge he had occasional pauses of 2.5 seconds, evaluated by cardiology who reduced dose of metoprolol and amiodarone, after being discharged patient arrived to facility severely hypoxic and was not accepted.  Patient was brought in by EMS to [facility] ED where patient was endotracheally intubated and admitted to ICU by CCM.  He was extubated on January 17 and transferred to [facility] on 09/21/2018.  Medically stable for discharge to SNF pending insurance approval.   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  Suspected due to flash pulmonary edema of unclear etiology.  Hypoxia resolved for several days now.  Stable.  Acute on chronic diastolic CHF TTE December 2019: LVEF 60-65%.  -3.2 since admission.  Currently not on diuretics.  Has some bilateral upper extremity edema, more so on right upper extremity, likely related to right hemiparesis from stroke.  Upper extremity edema has  improved.  Acute encephalopathy CT head unremarkable.  EEG without evidence of seizure.  Remains on nightly Seroquel.  I discussed with patient spouse in detail on 1/25.  She reports that his mental status significantly changed after procedure to revascularize?  12/25.  Since then patient mental status has waxed and waned without substantial improvement or worsening.  He may be about 50% better.  He has.'s of coherency and then at times appears confused.  Likely related to recent large left CVA,?  Some aphasia.  Continue to monitor.  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  No new recommendations per neurology.  Follow-up in stroke clinic in 4 weeks to be arranged by neurology.  Continue Eliquis and Zetia.  Dysphagia Dysphagia 1 diet as per SLP recommendations.  As per RN, tolerating diet.  PAF with tacky/bradycardia syndrome Continue amiodarone 200 mg daily, apixaban, metoprolol 50 mg twice daily.  Rate controlled.  No pauses noted.  Although metoprolol dose had been reduced during last admission, increased dose again this admission due to tachycardia.  Stable.   Type II DM Had reasonable inpatient control.  Not on medications here.  A1c 7.1 recently.  Hyperlipidemia Continue Zetia.  Essential hypertension Controlled today.  Continue current regimen without change.  Monitor closely and adjust as needed.  Urinary stricture Foley catheter has been removed and reportedly voiding without difficulty.  Chronic macrocytic anemia Stable  Hypothyroid TSH elevated at 6.  Synthroid dose increased during this hospital stay.  Follow TSH and 4 to 6 weeks.  Acute kidney injury on stage III chronic  kidney disease Resolved.  Obesity/Body mass index is 35.45 kg/m.  Right femoral artery pseudoaneurysm Evaluated during last hospitalization.  Outpatient follow-up with vascular surgery.   DVT prophylaxis: Eliquis Code Status: Full Family Communication:  Discussed in detail with patient spouse on 1/25, updated care and answered questions. Disposition: DC to SNF pending insurance approval   Consultants:  Neurology PCCM admitted and signed off.  Procedures:  Intubation and extubation Foley catheter-discontinued  Antimicrobials:  None.   Subjective: Patient denies complaints.  No dyspnea.  Discussed with RN at bedside to see if we can mobilize patient, sitting in chair if possible.  ROS: As above, otherwise negative.  Objective:  Vitals:   09/27/18 2012 09/27/18 2046 09/28/18 0500 09/28/18 0800  BP: 122/75 (!) 124/108  (!) 159/98  Pulse: 87 70 65 80  Resp: 16  18   Temp: 97.7 F (36.5 C)   (!) 97.5 F (36.4 C)  TempSrc: Oral   Oral  SpO2: 95%  94% 93%  Weight:   115.3 kg   Height:        Examination:  General exam: Pleasant elderly male, moderately built and obese lying comfortably propped up in bed.  Stable Respiratory system: Clear to auscultation.  No increased work of breathing. Cardiovascular system: S1 & S2 heard, RRR. No JVD, murmurs, rubs, gallops or clicks. No pedal edema.  Stable Gastrointestinal system: Abdomen is nondistended, soft and nontender. No organomegaly or masses felt. Normal bowel sounds heard.  Stable Central nervous system: Alert and oriented to person, place and partly to time.  Follows simple instructions.  No facial asymmetry or dysarthria.?  Aphasia. Extremities: Right upper extremity grade 2 x 5 proximally and 4 x 5 distally.  Right lower extremity grade 2 x 5 power.  Left limbs grade 5 x 5 power.  No change in exam/stable. Skin: No rashes, lesions or ulcers Psychiatry: Judgement and insight impaired. Mood & affect pleasant & appropriate.     Data Reviewed: I have personally reviewed following labs and imaging studies  CBC: Recent Labs  Lab 09/22/18 0433  WBC 8.4  HGB 10.9*  HCT 36.0*  MCV 98.9  PLT 287   Basic Metabolic Panel: Recent Labs  Lab 09/22/18 0433 09/24/18 0924   NA 140 137  K 3.8 4.0  CL 100 100  CO2 31 28  GLUCOSE 148* 188*  BUN 15 13  CREATININE 1.21 1.17  CALCIUM 9.2 9.2   Liver Function Tests: Recent Labs  Lab 09/22/18 0433  AST 30  ALT 22  ALKPHOS 82  BILITOT 0.7  PROT 6.6  ALBUMIN 2.6*   CBG: No results for input(s): GLUCAP in the last 168 hours.  No results found for this or any previous visit (from the past 240 hour(s)).       Radiology Studies: No results found.      Scheduled Meds: . amiodarone  200 mg Oral Daily  . apixaban  5 mg Oral BID  . cholecalciferol  1,000 Units Oral Daily  . ezetimibe  10 mg Oral Daily  . feeding supplement (GLUCERNA SHAKE)  237 mL Oral TID BM  . feeding supplement (PRO-STAT SUGAR FREE 64)  30 mL Oral Daily  . levothyroxine  100 mcg Oral QAC breakfast  . mouth rinse  15 mL Mouth Rinse BID  . metoprolol tartrate  50 mg Oral BID  . multivitamin with minerals  1 tablet Oral Daily  . QUEtiapine  75 mg Oral QHS  . sodium chloride flush  10-40  mL Intracatheter Q12H  . vitamin C  500 mg Oral Daily   Continuous Infusions:   LOS: 11 days     Marcellus Scott, MD, FACP, [facility]. [facility]  To contact the attending provider between 7A-7P or the covering provider during after hours 7P-7A, please log into the web site [email or web address] and access using universal [place] password for that web site. If you do not have the password, please call the hospital operator.  09/28/2018, 9:38 AM

## 2018-09-29 NOTE — Progress Notes (Signed)
PROGRESS NOTE   Darren Frazier  BTD:[id number]    DOB: 01-20-1941    DOA: 09/17/2018  PCP: Lynnea Ferrier, MD   I have briefly reviewed patients previous medical records in [facility].  Brief Narrative:  78 year old married male with PMH of PAF, NICM with EF of 60 to 65%, AAA, OSA, HTN, HLD, DM 2, PAD, GERD, morbid obesity, hospitalized 08/27/2018- 09/17/2022 left MCA and ACA territory embolic stroke likely due to left ICA occlusion which could not be revascularized, Pradaxa was changed to Eliquis, also evaluated and treated for aspiration pneumonitis, right femoral artery pseudoaneurysm, prior to discharge he had occasional pauses of 2.5 seconds, evaluated by cardiology who reduced dose of metoprolol and amiodarone, after being discharged patient arrived to facility severely hypoxic and was not accepted.  Patient was brought in by EMS to [facility] ED where patient was endotracheally intubated and admitted to ICU by CCM.  He was extubated on January 17 and transferred to [facility] on 09/21/2018.  Medically stable for discharge to SNF pending insurance approval.   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  Suspected due to flash pulmonary edema of unclear etiology.  Hypoxia resolved for several days now.  Stable.  Acute on chronic diastolic CHF TTE December 2019: LVEF 60-65%.  -3.2 since admission.  Currently not on diuretics.  Has some bilateral upper extremity edema, more so on right upper extremity, likely related to right hemiparesis from stroke.  Upper extremity edema has improved.   Stable off of diuretics.  Acute encephalopathy CT head unremarkable.  EEG without evidence of seizure.  Remains on nightly Seroquel.  I discussed with patient spouse in detail on 1/25.  She reports that his mental status significantly changed after procedure to revascularize?  12/25.  Since then patient mental status has waxed and waned without substantial improvement or worsening.  He may be about 50% better.  He has.'s of coherency and then at times appears confused.  Likely related to recent large left CVA,?  Some aphasia.  Appears slightly more confused today but wants to urinate despite having a condom catheter.  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  No new recommendations per neurology.  Follow-up in stroke clinic in 4 weeks to be arranged by neurology.  Continue Eliquis and Zetia.  Dysphagia Dysphagia 1 diet as per SLP recommendations.  As per RN, tolerating diet.  PAF with tacky/bradycardia syndrome Continue amiodarone 200 mg daily, apixaban, metoprolol 50 mg twice daily.  Rate controlled.  No pauses noted.  Although metoprolol dose had been reduced during last admission, increased dose again this admission due to tachycardia.  Stable.   Type II DM Had reasonable inpatient control.  Not on medications here.  A1c 7.1 recently.  Hyperlipidemia Continue Zetia.  Essential hypertension Controlled today.  Continue current regimen without change.  Monitor closely and adjust as needed.  Urinary stricture Foley catheter has been removed and reportedly voiding without difficulty.  Chronic macrocytic anemia Stable  Hypothyroid TSH elevated at 6.  Synthroid dose increased during this hospital stay.  Follow TSH and 4 to 6 weeks.  Acute kidney injury on stage III chronic kidney disease Resolved.  Obesity/Body mass index is 34.9 kg/m.  Right femoral artery pseudoaneurysm Evaluated during last hospitalization.  Outpatient follow-up with vascular  surgery.   DVT prophylaxis: Eliquis Code Status: Full Family Communication: None at bedside today. Disposition: DC to SNF pending insurance approval.  Clinical social worker aware.   Consultants:  Neurology PCCM admitted and signed off.  Procedures:  Intubation and extubation Foley catheter-discontinued  Antimicrobials:  None.   Subjective: Patient wants to get up to urinate despite advising him that he has a condom catheter and can continue to urinate right into it.  Appears slightly more confused and restless.  ROS: As above, otherwise negative.  Objective:  Vitals:   09/28/18 1947 09/29/18 0327 09/29/18 0500 09/29/18 0840  BP: 91/68 128/79  (!) 157/79  Pulse: 78 80  90  Resp: 15     Temp:  (!) 97.4 F (36.3 C)  97.7 F (36.5 C)  TempSrc:  Oral  Oral  SpO2: 98% 97%  97%  Weight:   113.5 kg   Height:        Examination:  General exam: Pleasant elderly male, moderately built and obese lying comfortably propped up in bed.  Does not appear in any distress.  Slightly restless. Respiratory system: Clear to auscultation.  No increased work of breathing.  Stable. Cardiovascular system: S1 & S2 heard, RRR. No JVD, murmurs, rubs, gallops or clicks. No pedal edema.  Stable. Gastrointestinal system: Abdomen is nondistended, soft and nontender. No organomegaly or masses felt. Normal bowel sounds heard.  Stable. Central nervous system: Alert and oriented to person and partly to place.  Follows simple instructions.  No facial asymmetry or dysarthria.  Likely aphasia. Extremities: Right upper extremity grade 2 x 5 proximally and 4 x 5 distally.  Right lower extremity grade 2 x 5 power.  Left limbs grade 5 x 5 power.  No change in exam/stable. Skin: No rashes, lesions or ulcers Psychiatry: Judgement and insight impaired. Mood & affect as above..     Data Reviewed: I have personally reviewed following labs and imaging studies  CBC: No results for input(s): WBC, NEUTROABS,  HGB, HCT, MCV, PLT in the last 168 hours. Basic Metabolic Panel: Recent Labs  Lab 09/24/18 0924  NA 137  K 4.0  CL 100  CO2 28  GLUCOSE 188*  BUN 13  CREATININE 1.17  CALCIUM 9.2   Liver Function Tests: No results for input(s): AST, ALT, ALKPHOS, BILITOT, PROT, ALBUMIN in the last 168 hours. CBG: No results for input(s): GLUCAP in the last 168 hours.  No results found for this or any previous visit (from the past 240 hour(s)).       Radiology Studies: No results found.      Scheduled Meds: . amiodarone  200 mg Oral Daily  . apixaban  5 mg Oral BID  . cholecalciferol  1,000 Units Oral Daily  . ezetimibe  10 mg Oral Daily  . feeding supplement (GLUCERNA SHAKE)  237 mL Oral TID BM  . feeding supplement (PRO-STAT SUGAR FREE 64)  30 mL Oral Daily  . levothyroxine  100 mcg Oral QAC breakfast  . mouth rinse  15 mL Mouth Rinse BID  . metoprolol tartrate  50 mg Oral BID  . multivitamin with minerals  1 tablet Oral Daily  . QUEtiapine  75 mg Oral QHS  . sodium chloride flush  10-40 mL Intracatheter Q12H  .  vitamin C  500 mg Oral Daily   Continuous Infusions:   LOS: 12 days     Marcellus Scott, MD, FACP, [facility]. [facility]  To contact the attending provider between 7A-7P or the covering provider during after hours 7P-7A, please log into the web site [email or web address] and access using universal [place] password for that web site. If you do not have the password, please call the hospital operator.  09/29/2018, 9:06 AM

## 2018-09-29 NOTE — Progress Notes (Signed)
Focus of session on bed mobility, sitting balance, R UE neuromuscular reeducation and grooming. Pt fatigues with relative ease, but works hard. Will continue to follow.  09/29/18 1200  OT Visit Information  Last OT Received On 09/29/18  Assistance Needed +2  PT/OT/SLP Co-Evaluation/Treatment Yes  Reason for Co-Treatment For patient/therapist safety  OT goals addressed during session ADL's and self-care;Strengthening/ROM  History of Present Illness Pt is a 78 y/o male who was discharged on 09/17/18 after hospitalization 12/25 -1/15 for acute stroke. Pt was refused at rehab facility due to hypoxia. He required intubation for airway protection on arrival to ER (intubated 1/15-1/17).  Head CT neg.  CXR c/w pulmonary edema. PMH including but not limited to recent L MCA/ACA stroke, DM, HTN, PVD, tremors.  Precautions  Precautions Fall  Required Braces or Orthoses Other Brace  Other Brace PRAFO boots bil  Pain Assessment  Pain Assessment Faces  Faces Pain Scale 6  Pain Location right shoulder and thumb  Pain Descriptors / Indicators Grimacing  Pain Intervention(s) Monitored during session;Repositioned  Cognition  Arousal/Alertness Awake/alert  Behavior During Therapy WFL for tasks assessed/performed  Overall Cognitive Status Impaired/Different from baseline  Area of Impairment Attention;Memory;Following commands;Safety/judgement;Awareness;Problem solving  Orientation Level Disoriented to;Time;Situation  Current Attention Level Sustained  Memory Decreased recall of precautions;Decreased short-term memory  Following Commands Follows one step commands with increased time  Safety/Judgement Decreased awareness of deficits;Decreased awareness of safety  Awareness Intellectual  Problem Solving Slow processing;Decreased initiation;Difficulty sequencing;Requires verbal cues;Requires tactile cues  Upper Extremity Assessment  Upper Extremity Assessment RUE deficits/detail  RUE Deficits / Details worked  on incorporating R UE in B ADL and towel exercises seated at EOB with overbed table  RUE Coordination decreased fine motor;decreased gross motor  ADL  Overall ADL's  Needs assistance/impaired  Grooming Brushing hair;Sitting;Moderate assistance  Grooming Details (indicate cue type and reason) attempting to use R hand to comb hair, assist at elbow for pt to comb R side of hair with R hand  Lower Body Dressing Total assistance;Bed level  Lower Body Dressing Details (indicate cue type and reason) socks  Toileting- Clothing Manipulation and Hygiene Total assistance;Bed level  Toileting - Clothing Manipulation Details (indicate cue type and reason) assisted pt with clean up from bed pan use  Bed Mobility  Overal bed mobility Needs Assistance  Bed Mobility Rolling;Sidelying to Sit;Sit to Supine  Rolling Min assist;Mod assist  Sidelying to sit +2 for physical assistance;Mod assist  Sit to supine +2 for physical assistance;Mod assist  General bed mobility comments assist for trunk and LEs, rolls to R with min assist, to L with mod assist  Balance  Overall balance assessment Needs assistance  Sitting-balance support Single extremity supported  Sitting balance-Leahy Scale Poor  Sitting balance - Comments tolerate EOB dynamic balance activities for ~14 minutes  Postural control Right lateral lean  Transfers  General transfer comment deferred this session  Other Exercises  Other Exercises Rolling multi directional with LE assist RLE (part task performance)  Other Exercises Towel slides RUE x10  OT - End of Session  Activity Tolerance Patient tolerated treatment well  Patient left in bed;with call bell/phone within reach;with bed alarm set  Nurse Communication Other (comment) (BM)  OT Assessment/Plan  OT Plan Discharge plan remains appropriate  OT Visit Diagnosis Unsteadiness on feet (R26.81);Other abnormalities of gait and mobility (R26.89);Muscle weakness (generalized) (M62.81);Other symptoms  and signs involving the nervous system (R29.898);Other symptoms and signs involving cognitive function;Hemiplegia and hemiparesis  Hemiplegia - Right/Left Right  Hemiplegia - dominant/non-dominant Dominant  Hemiplegia - caused by Cerebral infarction  OT Frequency (ACUTE ONLY) Min 2X/week  Follow Up Recommendations SNF;Supervision/Assistance - 24 hour  AM-PAC OT "6 Clicks" Daily Activity Outcome Measure (Version 2)  Help from another person eating meals? 2  Help from another person taking care of personal grooming? 2  Help from another person toileting, which includes using toliet, bedpan, or urinal? 1  Help from another person bathing (including washing, rinsing, drying)? 2  Help from another person to put on and taking off regular upper body clothing? 2  Help from another person to put on and taking off regular lower body clothing? 1  6 Click Score 10  OT Goal Progression  Progress towards OT goals Progressing toward goals  Acute Rehab OT Goals  Patient Stated Goal "Go to rehab and get better"   OT Goal Formulation With family  Time For Goal Achievement 10/04/18  Potential to Achieve Goals Good  OT Time Calculation  OT Start Time (ACUTE ONLY) 0952  OT Stop Time (ACUTE ONLY) 1020  OT Time Calculation (min) 28 min  Martie Round, OTR/L Acute Rehabilitation Services Pager: [phone] Office: [phone]

## 2018-09-29 NOTE — Progress Notes (Signed)
Physical Therapy Treatment Patient Details Name: Darren Frazier MRN: [id number] DOB: 09-23-1940 Today's Date: 09/29/2018    History of Present Illness Pt is a 78 y/o male who was discharged on 09/17/18 after hospitalization 12/25 -1/15 for acute stroke. Pt was refused at rehab facility due to hypoxia. He required intubation for airway protection on arrival to ER (intubated 1/15-1/17).  Head CT neg.  CXR c/w pulmonary edema. PMH including but not limited to recent L MCA/ACA stroke, DM, HTN, PVD, tremors.    PT Comments    Extended therapy session performed in conjunction with OT for EOB trunk control and functional task performance. Patient tolerated well with noted improvements in midline orientation but remain limited in ability to utilize right side. Current POC remains appropriate.   Follow Up Recommendations  SNF     Equipment Recommendations  None recommended by PT    Recommendations for Other Services       Precautions / Restrictions Precautions Precautions: Fall Required Braces or Orthoses: Other Brace Other Brace: PRAFO boots bil Restrictions Weight Bearing Restrictions: No    Mobility  Bed Mobility Overal bed mobility: Needs Assistance Bed Mobility: Rolling;Supine to Sit;Sit to Supine Rolling: Min assist   Supine to sit: +2 for physical assistance;Mod assist;+2 for safety/equipment;HOB elevated Sit to supine: +2 for physical assistance;Mod assist   General bed mobility comments: +2 moderate assist to elevate trunk to upright using LUE for pull to position, moderate assist to return to supine and reposition in bed. min assist to roll to lright, mod assist to roll to left  Transfers                 General transfer comment: deferred this session  Ambulation/Gait                 Stairs             Wheelchair Mobility    Modified Rankin (Stroke Patients Only) Modified Rankin (Stroke Patients Only) Pre-Morbid Rankin Score: No  significant disability Modified Rankin: Severe disability     Balance Overall balance assessment: Needs assistance Sitting-balance support: Single extremity supported Sitting balance-Leahy Scale: Poor Sitting balance - Comments: tolerate EOB dynamic balance activities for ~14 minutes Postural control: Right lateral lean   Standing balance-Leahy Scale: Zero                              Cognition Arousal/Alertness: Awake/alert Behavior During Therapy: WFL for tasks assessed/performed Overall Cognitive Status: Impaired/Different from baseline Area of Impairment: Attention;Memory;Following commands;Safety/judgement;Awareness;Problem solving                 Orientation Level: Disoriented to;Time;Situation Current Attention Level: Sustained Memory: Decreased recall of precautions;Decreased short-term memory Following Commands: Follows one step commands with increased time Safety/Judgement: Decreased awareness of deficits;Decreased awareness of safety Awareness: Intellectual Problem Solving: Slow processing;Decreased initiation;Difficulty sequencing;Requires verbal cues;Requires tactile cues        Exercises Other Exercises Other Exercises: EOB dynamic trunk control with visual feedback Other Exercises: EOB functional task performance with assist RUE usage Other Exercises: Left lateral lean on elbows x5 Other Exercises: Rolling multi directional with LE assist RLE (part task performance) Other Exercises: Towel slides RUE x10    General Comments        Pertinent Vitals/Pain Pain Assessment: Faces Faces Pain Scale: Hurts even more Pain Location: right shoulder and thumb Pain Descriptors / Indicators: Grimacing Pain Intervention(s): Monitored during session  Home Living                      Prior Function            PT Goals (current goals can now be found in the care plan section) Acute Rehab PT Goals Patient Stated Goal: "Go to rehab and  get better" Wife PT Goal Formulation: With patient/family Time For Goal Achievement: 10/04/18 Potential to Achieve Goals: Fair Progress towards PT goals: Progressing toward goals    Frequency    Min 3X/week      PT Plan Current plan remains appropriate    Co-evaluation PT/OT/SLP Co-Evaluation/Treatment: Yes Reason for Co-Treatment: Complexity of the patient's impairments (multi-system involvement) PT goals addressed during session: Mobility/safety with mobility OT goals addressed during session: ADL's and self-care      AM-PAC PT "6 Clicks" Mobility   Outcome Measure  Help needed turning from your back to your side while in a flat bed without using bedrails?: A Lot Help needed moving from lying on your back to sitting on the side of a flat bed without using bedrails?: A Lot Help needed moving to and from a bed to a chair (including a wheelchair)?: Total Help needed standing up from a chair using your arms (e.g., wheelchair or bedside chair)?: Total Help needed to walk in hospital room?: Total Help needed climbing 3-5 steps with a railing? : Total 6 Click Score: 8    End of Session Equipment Utilized During Treatment: Oxygen Activity Tolerance: Patient limited by fatigue Patient left: in bed;with call bell/phone within reach;with bed alarm set;with SCD's reapplied;with family/visitor present Nurse Communication: Mobility status;Need for lift equipment PT Visit Diagnosis: Other abnormalities of gait and mobility (R26.89);Muscle weakness (generalized) (M62.81);Other symptoms and signs involving the nervous system (R29.898);Hemiplegia and hemiparesis Hemiplegia - Right/Left: Right Hemiplegia - dominant/non-dominant: Dominant Hemiplegia - caused by: Cerebral infarction     Time: 0952-1020 PT Time Calculation (min) (ACUTE ONLY): 28 min  Charges:  $Neuromuscular Re-education: 8-22 mins                     [name], PT DPT  Board Certified Neurologic  Specialist Acute Rehabilitation Services Pager [phone] Office [phone]    Fabio Asa 09/29/2018, 12:03 PM

## 2018-09-29 NOTE — Progress Notes (Signed)
  Speech Language Pathology Treatment: Dysphagia  Patient Details Name: Darren Frazier MRN: [id number] DOB: Feb 18, 1941 Today's Date: 09/29/2018 Time: 0160-1093 SLP Time Calculation (min) (ACUTE ONLY): 22 min  Assessment / Plan / Recommendation Clinical Impression  Pt demonstrates improved sustained attention to mastication with soft solids, needing only Min cues from SLP. He has good oral clearance and he is eager to advance to a more palatable diet. Will advance to Dys 3 diet and thin liquids, but would continue with full supervision as mentation can fluctuate. Will follow for tolerance.   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 Seen by SLP during admission, advanced to regular/thin liquid diet. D/c to rehab but was not accepted due to hypoxemia, required intubation on return to ED on 09/17/18. Extubated 09/19/18.       SLP Plan  Continue with current plan of care       Recommendations  Diet recommendations: Dysphagia 3 (mechanical soft);Thin liquid Liquids provided via: Cup;Straw Medication Administration: Whole meds with puree Supervision: Staff to assist with self feeding;Full supervision/cueing for compensatory strategies;Trained caregiver to feed patient Compensations: Slow rate;Small sips/bites;Minimize environmental distractions Postural Changes and/or Swallow Maneuvers: Seated upright 90 degrees                Oral Care Recommendations: Oral care BID Follow up Recommendations: Skilled Nursing facility SLP Visit Diagnosis: Dysphagia, oropharyngeal phase (R13.12) Plan: Continue with current plan of care       GO                Virl Axe Ulice Follett 09/29/2018, 4:32 PM  Natalia Leatherwood, M.A. CCC-SLP Acute Herbalist [phone] Office [phone]

## 2018-09-29 NOTE — Clinical Social Work Note (Addendum)
Insurance authorization is still pending.  Charlynn Court, CSW [phone]  2:42 pm Insurance authorization still pending. Sent today's therapy notes to SNF.  Charlynn Court, CSW [phone]

## 2018-09-30 DIAGNOSIS — E1159 Type 2 diabetes mellitus with other circulatory complications: Secondary | ICD-10-CM

## 2018-09-30 DIAGNOSIS — G9340 Encephalopathy, unspecified: Secondary | ICD-10-CM

## 2018-09-30 DIAGNOSIS — I63232 Cerebral infarction due to unspecified occlusion or stenosis of left carotid arteries: Principal | ICD-10-CM

## 2018-09-30 MED ORDER — METOPROLOL TARTRATE 25 MG PO TABS: 50.0000 mg | ORAL_TABLET | Freq: Two times a day (BID) | ORAL | Status: DC

## 2018-09-30 MED ORDER — GLUCERNA SHAKE PO LIQD: 237.0000 mL | Freq: Three times a day (TID) | ORAL | Status: DC

## 2018-09-30 MED ORDER — LEVOTHYROXINE SODIUM 100 MCG PO TABS: 100.0000 ug | ORAL_TABLET | Freq: Every day | ORAL | Status: DC

## 2018-09-30 MED ORDER — PRO-STAT SUGAR FREE PO LIQD: 30.0000 mL | Freq: Every day | ORAL | Status: DC

## 2018-09-30 MED ORDER — QUETIAPINE FUMARATE 50 MG PO TABS: 75.0000 mg | ORAL_TABLET | Freq: Every day | ORAL | Status: AC

## 2018-09-30 MED ORDER — ADULT MULTIVITAMIN W/MINERALS CH: 1.0000 | ORAL_TABLET | Freq: Every day | ORAL | Status: AC

## 2018-09-30 NOTE — Discharge Instructions (Signed)

## 2018-09-30 NOTE — Clinical Social Work Note (Addendum)
[employer] DON is calling [employer] to see why authorization is taking so long.  Charlynn Court, CSW [phone]  10:34 am Insurance authorization approved. Paged MD to notify.  Charlynn Court, CSW [phone]

## 2018-09-30 NOTE — Progress Notes (Signed)
SLP Cancellation Note  Patient Details Name: Alain Micallef MRN: [id number] DOB: 14-Dec-1940   Cancelled treatment:       Reason Eval/Treat Not Completed: Other (comment) Pt is being prepped for discharge, but his wife and RN deny any overt difficulty with swallowing (although appetite remains low). Would defer additional f/u to SLP at SNF.   Virl Axe Shareena Nusz 09/30/2018, 2:22 PM  Natalia Leatherwood, M.A. CCC-SLP Acute Herbalist [phone] Office [phone]

## 2018-09-30 NOTE — Progress Notes (Signed)
Nutrition Follow-up  DOCUMENTATION CODES:   Obesity unspecified  INTERVENTION:   -ContinueGlucerna Shake po TID, each supplement provides 220 kcal and 10 grams of protein -ContinueMagic Cup TID with meals, each supplement provides 290 kcals and 9 grams protein -ContinueMVI with minerals daily -Continue 30 ml  Prostat daily, each supplement provides 100 kcals and 15 grams protein  NUTRITION DIAGNOSIS:   Inadequate oral intake related to dysphagia as evidenced by per patient/family report.  Progressing  GOAL:   Patient will meet greater than or equal to 90% of their needs  Progressing  MONITOR:   PO intake, Supplement acceptance, Diet advancement, Labs, Weight trends, Skin, I & O's  REASON FOR ASSESSMENT:   Consult Assessment of nutrition requirement/status  ASSESSMENT:   78 yo male with PMH of HTN, HLD, DM, dysrhythmia, stroke (Dec 2019), PVD, tremors, morbid obesity, vitamin D deficiency who was re-admitted 1/15 with hypoxia. Just discharged 1/15, but sent right back to the hospital with hypoxia requiring intubation.  1/16- PICC placed, TF initiated 1/17- extubated 1/18- s/p BSE- advanced to full liquids 1/19- repeat BSE- not ready to advancement to solids yet 1/20- advanced to dysphagia 1 diet with thin liquids 1/23- s/p MBSS- continue dysphagia 1 diet with thin liquids 1/27- advanced to dysphagia 3 diet with thin liquids  Reviewed I/O's: -135 ml x 24 hours and -3.4 L since admission  Case discussed with RN, who reports intake is poor. Pt does not like the foods offered here. Noted meal completion 10-25%. Pt refused Glucerna supplement this AM as well. RN reports that all pt desires to drink is Coke.   Spoke with pt and wife at bedside. Pt in good spirits today. Per pt wife, she reports she thinks appetite will improve now that pt is on more solid foods. Per pt wife, pt will be discharge today to [facility] in [place]. Discussed with pt and wife  importance of good meal and supplement intake to promote healing.   Labs reviewed: CBGS: 120-141  Diet Order:   Diet Order            DIET DYS 3 Room service appropriate? Yes; Fluid consistency: Thin  Diet effective now              EDUCATION NEEDS:   Education needs have been addressed  Skin:  Skin Integrity Issues:: Other (Comment) Other: 2 non pressure wounds to R hand, puncture wound R abdomen  Last BM:  09/29/18  Height:   Ht Readings from Last 1 Encounters:  09/17/18 5\' 11"  (1.803 m)    Weight:   Wt Readings from Last 1 Encounters:  09/30/18 113.5 kg    Ideal Body Weight:  78.2 kg  BMI:  Body mass index is 34.9 kg/m.  Estimated Nutritional Needs:   Kcal:  2150-2350  Protein:  115-130 grams  Fluid:  >2.1 L    Saddie Sandeen A. Mayford Knife, RD, LDN, CDE Pager: [phone] After hours Pager: [phone]

## 2018-09-30 NOTE — Progress Notes (Signed)
Removed PIV access and gave a report to [facility]. Updated to pt's wife.  Awaiting for [facility]. HS [employer]

## 2018-09-30 NOTE — Clinical Social Work Note (Signed)
CSW facilitated patient discharge including contacting patient family and facility to confirm patient discharge plans. Clinical information faxed to facility and family agreeable with plan. CSW arranged ambulance transport via [facility] to [facility]. RN to call report prior to discharge [phone] Room 325).  CSW will sign off for now as social work intervention is no longer needed. Please consult [place] again if new needs arise.  Charlynn Court, CSW [phone]

## 2018-09-30 NOTE — Discharge Summary (Signed)
Physician Discharge Summary  Florian Kincaid ENI:[id number] DOB: 05/03/1941  PCP: Lynnea Ferrier, MD  Admit date: 09/17/2018 Discharge date: 09/30/2018  Recommendations for Outpatient Follow-up:  1. MD at SNF in 2 to 3 days with repeat labs (CBC & BMP). 2. Stroke clinic at [facility] in 4 weeks.  Ambulatory referral sent via [facility]. 3. Dr. Daniel Nones III/PCP upon discharge from SNF. 4. Dr. Marcina Millard, Cardiology in 4 weeks. 5. Dr. Gretta Began, [facility] 6. Monitor CBGs closely at SNF, 3 times daily before meals and at bedtime and consider antidiabetic medications if needed.  Patient reportedly was on twice daily metformin prior to last hospitalization. 7. Recommend repeating TSH and 4 to 6 weeks. 8. Repeat urine microscopy in a couple of weeks and if microscopic hematuria persists, then will need further evaluation. 9. CPAP at bedtime.  Home Health: Patient being discharged to [facility] in [place]/SNF. Equipment/Devices: TBD at SNF.  Discharge Condition: Improved and stable. CODE STATUS: Full Diet recommendation: Heart healthy & diabetic diet.  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)   Acute respiratory failure (HCC)   CHF (congestive heart failure) (HCC)   Morbid obesity (HCC)   Brief Summary: 78 year old married male with PMH of PAF, NICM with EF of  60 to 65%, AAA, OSA, HTN, HLD, DM 2, PAD, GERD, morbid obesity, hospitalized 08/27/2018- 09/17/2022 left MCA and ACA territory embolic stroke likely due to left ICA occlusion which could not be revascularized, Pradaxa was changed to Eliquis, also evaluated and treated for aspiration pneumonitis, right femoral artery pseudoaneurysm, prior to discharge he had occasional pauses of 2.5 seconds, evaluated by Cardiology who reduced dose of metoprolol and amiodarone, after being discharged patient arrived to facility severely hypoxic and was not accepted.  Patient was brought in by EMS to [facility] ED where patient was endotracheally intubated and admitted to ICU by CCM.  He was extubated on January 17 and transferred to [facility] on 09/21/2018.    Assessment & Plan:  Acute hypoxic respiratory failure Successfully extubated 1/17.  Suspected due to flash pulmonary edema of unclear etiology. Hypoxia resolved for several days now and stable on room air.  Acute on chronic diastolic CHF TTE December 2019: LVEF 60-65%.  -3.3 L since admission.  Currently not on diuretics.  Compensated and clinically euvolemic.  Monitor closely at SNF.  Acute encephalopathy CT head unremarkable.  EEG without evidence of seizure.  Remains on nightly Seroquel.  I discussed with patient spouse in detail on 1/25.  She reports that his mental status significantly changed after procedure to revascularize?  12/25.  Since then patient mental status has waxed and waned without substantial improvement or worsening.  He may be about 50% better.  He has periods where he is coherent and then at times appears confused.  Likely related to recent large left CVA and possible some aphasia.    Mental status has been stable over the last several days without agitation.  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  No new recommendations  per neurology.  Follow-up in stroke clinic in 4 weeks to be arranged by Neurology.  Continue  Eliquis and Zetia.  Dysphagia SLP reassessed patient on 1/27 and diet consistency as noted above.  PAF with tacky/bradycardia syndrome Continue amiodarone 200 mg daily, apixaban, metoprolol 50 mg twice daily.  Rate controlled.  No pauses noted.  Although metoprolol dose had been reduced during last admission, increased dose again this admission due to tachycardia and tolerated same.  Type II DM Had reasonable inpatient control.  Not on medications here.  A1c 7.1 recently.  Monitor CBGs closely at SNF to assess if antidiabetics need to be resumed.  Hyperlipidemia Continue Zetia and fish oil.  Essential hypertension Controlled on current regimen, continue.  Urinary stricture Foley catheter has been removed and voiding without difficulty.  Chronic macrocytic anemia Stable  Hypothyroid TSH elevated at 6.  Synthroid dose increased during this hospital stay.  Follow TSH and 4 to 6 weeks.  Acute kidney injury on stage III chronic kidney disease Resolved.  Obesity/Body mass index is 34.9 kg/m.  OSA As per spouse, patient was on CPAP at home and has been on it for 20 years.  Reportedly had refused early on in the hospitalization.  Continue CPAP nightly and hopefully will cooperate and tolerate.  Right femoral artery pseudoaneurysm Evaluated during last hospitalization.  Outpatient follow-up with vascular surgery.  Microscopic hematuria Noted on urine microscopy 09/17/2018.  Asymptomatic of urinary symptoms.  Recommend repeating urine microscopy in a couple of weeks and if this persists then will need further evaluation.  It could be related to Foley catheter trauma which he may have had during last hospitalization.   Consultants:  Neurology PCCM admitted and signed off.  Procedures:  Intubation and extubation Foley catheter-discontinued   Discharge Instructions  Discharge Instructions    (HEART FAILURE PATIENTS) Call MD:  Anytime you have any of the following  symptoms: 1) 3 pound weight gain in 24 hours or 5 pounds in 1 week 2) shortness of breath, with or without a dry hacking cough 3) swelling in the hands, feet or stomach 4) if you have to sleep on extra pillows at night in order to breathe.   Complete by:  As directed    Ambulatory referral to Neurology   Complete by:  As directed    Follow up with stroke clinic NP (Jessica Vanschaick or Darrol Angel, if both not available, consider Manson Allan, or Ahern) at [facility] in about 4 weeks. Thanks.   Call MD for:  difficulty breathing, headache or visual disturbances   Complete by:  As directed    Call MD for:  extreme fatigue   Complete by:  As directed    Call MD for:  persistant dizziness or light-headedness   Complete by:  As directed    Call MD for:  persistant nausea and vomiting   Complete by:  As directed    Call MD for:  severe uncontrolled pain   Complete by:  As directed    Call MD for:  temperature >100.4   Complete by:  As directed    Diet - low sodium heart healthy   Complete by:  As directed    Diet Carb Modified   Complete by:  As directed    Discharge instructions   Complete by:  As directed    Diet consistency as per SLP recommendations as follows:  Diet recommendations: Dysphagia 3 (mechanical soft);Thin liquid Liquids provided via: Cup;Straw Medication Administration: Whole meds with puree Supervision:  Staff to assist with self feeding;Full supervision/cueing for compensatory strategies;Trained caregiver to feed patient Compensations: Slow rate;Small sips/bites;Minimize environmental distractions Postural Changes and/or Swallow Maneuvers: Seated upright 90 degrees   Increase activity slowly   Complete by:  As directed        Medication List    STOP taking these medications   famotidine 20 MG tablet Commonly known as:  PEPCID   insulin aspart 100 UNIT/ML injection Commonly known as:  novoLOG   insulin detemir 100 UNIT/ML injection Commonly known as:   LEVEMIR   sulfamethoxazole-trimethoprim 400-80 MG tablet Commonly known as:  BACTRIM,SEPTRA     TAKE these medications   albuterol (2.5 MG/3ML) 0.083% nebulizer solution Commonly known as:  PROVENTIL Take 3 mLs (2.5 mg total) by nebulization every 6 (six) hours as needed for wheezing or shortness of breath.   amiodarone 200 MG tablet Commonly known as:  PACERONE Take 1 tablet (200 mg total) by mouth daily.   apixaban 5 MG Tabs tablet Commonly known as:  ELIQUIS Take 1 tablet (5 mg total) by mouth 2 (two) times daily.   ezetimibe 10 MG tablet Commonly known as:  ZETIA Take 1 tablet (10 mg total) by mouth daily.   feeding supplement (GLUCERNA SHAKE) Liqd Take 237 mLs by mouth 3 (three) times daily between meals. What changed:  when to take this   feeding supplement (PRO-STAT SUGAR FREE 64) Liqd Take 30 mLs by mouth daily. Start taking on:  October 01, 2018   Fish Oil 1200 MG Caps Take 1,200 mg by mouth 3 (three) times daily.   fluticasone 50 MCG/ACT nasal spray Commonly known as:  FLONASE Place 2 sprays into both nostrils daily as needed for allergies or rhinitis.   levothyroxine 100 MCG tablet Commonly known as:  SYNTHROID, LEVOTHROID Take 1 tablet (100 mcg total) by mouth daily before breakfast. What changed:    medication strength  how much to take   metoprolol tartrate 25 MG tablet Commonly known as:  LOPRESSOR Take 2 tablets (50 mg total) by mouth 2 (two) times daily. What changed:  how much to take   multivitamin with minerals Tabs tablet Take 1 tablet by mouth daily. Start taking on:  October 01, 2018   QUEtiapine 50 MG tablet Commonly known as:  SEROQUEL Take 1.5 tablets (75 mg total) by mouth at bedtime. What changed:  how much to take   senna-docusate 8.6-50 MG tablet Commonly known as:  Senokot-S Take 1 tablet by mouth at bedtime as needed for mild constipation.   vitamin C 500 MG tablet Commonly known as:  ASCORBIC ACID Take 500 mg by mouth  daily.   Vitamin D3 25 MCG (1000 UT) Caps Take 1,000 Units by mouth daily.       Contact information for follow-up providers    [facility]. Schedule an appointment as soon as possible for a visit in 4 week(s).   Specialty:  Neurology Contact information: [address] [place] [address] [phone]       MD at SNF. Schedule an appointment as soon as possible for a visit.   Why:  To be seen in 2 to 3 days with repeat labs (CBC & BMP).       Lynnea Ferrier, MD. Schedule an appointment as soon as possible for a visit.   Specialty:  Internal Medicine Why:  Upon discharge from SNF. Contact information: [address] [place] [address] [phone]  Paraschos, Alexander, MD. Schedule an appointment as soon as possible for a visit in 4 week(s).   Specialty:  Cardiology Contact information: [address] [facility] [place] [address] [phone]        Larina Earthly, MD. Schedule an appointment as soon as possible for a visit.   Specialties:  Vascular Surgery, Cardiology Contact information: [address] [place] [address] [phone]            Contact information for after-discharge care    Destination    [facility] [place] [facility]erred [facility] .   Service:  Skilled Nursing Contact information: [address] [place] [address] [phone]                 Allergies  Allergen Reactions  . Ace Inhibitors Other (See Comments)    unknown  . Omeprazole Diarrhea  . Statins Other (See Comments)    Abnormal lab results(pt unsure of what labs)      Procedures/Studies: Ct Head Wo Contrast  Result Date: 09/17/2018 CLINICAL DATA:  78 year old male presents unresponsive after being discharged earlier today. Recent history of stroke. EXAM: CT HEAD WITHOUT CONTRAST TECHNIQUE: Contiguous axial images were obtained from the base of the  skull through the vertex without intravenous contrast. COMPARISON:  CTA head 09/10/2018; brain MRI 08/28/2018 FINDINGS: Brain: No new acute infarct identified. Continued evolution of a previously noted multifocal cortical infarcts without significant interval change or progression compared to 09/10/2018. Periventricular white matter hypoattenuation consistent with chronic microvascular ischemic white matter disease. Stable remote lacunar infarct of the left caudate head. Vascular: Atherosclerotic calcifications in the bilateral cavernous and supraclinoid internal carotid arteries. No hyperdense vascular sign. Skull: Normal. Negative for fracture or focal lesion. Sinuses/Orbits: No acute finding. Other: None. IMPRESSION: 1. No new acute intracranial abnormality. 2. Expected continued evolution of a multifocal bilateral cortical infarcts. 3. Chronic microvascular ischemic white matter disease. Electronically Signed   By: Malachy Moan M.D.   On: 09/17/2018 20:25   Dg Chest Port 1 View  Result Date: 09/19/2018 CLINICAL DATA:  Respiratory failure EXAM: PORTABLE CHEST 1 VIEW COMPARISON:  09/18/2018 FINDINGS: Cardiac shadow is enlarged but stable. Aortic calcifications are again seen. Endotracheal tube, nasogastric catheter and right-sided PICC line are again noted and stable. Inspiratory effort is poor. Left basilar atelectasis and effusion is again noted. Vascular congestion is again seen although extension weighted by the poor inspiratory effort. IMPRESSION: Tubes and lines as described. Stable left basilar atelectasis with vascular congestion. Electronically Signed   By: Alcide Clever M.D.   On: 09/19/2018 07:27   Dg Chest Port 1 View  Result Date: 09/18/2018 CLINICAL DATA:  PICC line placement. EXAM: PORTABLE CHEST 1 VIEW COMPARISON:  09/17/2018. FINDINGS: Endotracheal tube and NG tube in stable position. Right PICC line noted over cavoatrial junction. Cardiomegaly with pulmonary venous congestion and  bilateral interstitial prominence. Basilar atelectasis. Findings suggest CHF. Small left pleural effusion. IMPRESSION: 1. Endotracheal tube and NG tube in stable position. Right PICC line noted with tip over cavoatrial junction. 2. Cardiomegaly with pulmonary venous congestion bilateral interstitial prominence consistent CHF. 3.  Bibasilar atelectasis. Electronically Signed   By: Maisie Fus  Register   On: 09/18/2018 11:00   Dg Chest Port 1 View  Result Date: 09/17/2018 CLINICAL DATA:  Post intubation. EXAM: PORTABLE CHEST 1 VIEW COMPARISON:  Radiographs 09/15/2018 and 09/14/2018. FINDINGS: 1949 hours. Endotracheal tube tip is 3.3 cm above the carina. Enteric tube projects below the diaphragm with the tip  projecting over the proximal stomach. Cardiomegaly, aortic atherosclerosis and pulmonary edema are again noted. There is mildly increased retrocardiac opacity which could reflect atelectasis or developing airspace disease. No pneumothorax or significant pleural effusion. IMPRESSION: 1. Satisfactory position of the endotracheal and enteric tubes. 2. Persistent edema suggesting congestive heart failure. Increasing retrocardiac pulmonary opacity. Electronically Signed   By: Carey BullocksWilliam  Veazey M.D.   On: 09/17/2018 20:10   [place] Lower Ext Arterial Pseudo Injection  Result Date: 09/17/2018  ARTERIAL PSEUDOANEURYSM  Exam: Right groin Performing Technologist: Blanch MediaMegan Riddle RVS Supporting Technologist: Jeb LeveringJill Parker RDMS, RVT  Examination Guidelines: A complete evaluation includes B-mode imaging, spectral Doppler, color Doppler, and power Doppler as needed of all accessible portions of each vessel. Bilateral testing is considered an integral part of a complete examination. Limited examinations for reoccurring indications may be performed as noted.  Findings: An area with well defined borders measuring 2.1 cm x 2.3 cm was visualized with ultrasound characteristics of a pseudoaneurysm.  Summary: Successful thrombin injection  into right groin pseudoaneurysm. Completed by Dr. Chestine Sporelark.  Post injection the right SFA is patent.  Diagnosing physician: Gretta Beganodd Early MD Electronically signed by Gretta Beganodd Early MD on 09/17/2018 at 8:08:45 PM.   --------------------------------------------------------------------------------    Final      Subjective: Patient denies complaints.  No dyspnea, chest pain, dizziness or lightheadedness reported.  As per RN, no acute issues noted.  Had BM yesterday.  Discharge Exam:  Vitals:   09/29/18 2148 09/30/18 0500 09/30/18 0825 09/30/18 1121  BP: 135/80  124/75 (!) 147/65  Pulse: 91  87 74  Resp:    20  Temp:   98.2 F (36.8 C) 98.1 F (36.7 C)  TempSrc:   Oral Oral  SpO2:   97% 100%  Weight:  113.5 kg    Height:        General exam: Pleasant elderly male, moderately built and obese lying comfortably propped up in bed.   Respiratory system: Clear to auscultation.  No increased work of breathing.   Cardiovascular system: S1 & S2 heard, RRR. No JVD, murmurs, rubs, gallops or clicks. No pedal edema.  Gastrointestinal system: Abdomen is nondistended, soft and nontender. No organomegaly or masses felt. Normal bowel sounds heard.  Central nervous system: Alert and oriented to person and partly to place.  Follows simple instructions.  No facial asymmetry or dysarthria.  Likely aphasia. Extremities: Right upper extremity grade 2 x 5 proximally and 4 x 5 distally.  Right lower extremity grade 2 x 5 power.  Left limbs grade 5 x 5 power.   Skin: No rashes, lesions or ulcers Psychiatry: Judgement and insight impaired. Mood & affect pleasant.    The results of significant diagnostics from this hospitalization (including imaging, microbiology, ancillary and laboratory) are listed below for reference.      Labs: CBC: 09/22/2018: Hemoglobin 10.9, hematocrit 36, WBC 8.4 and platelets 287.  Basic Metabolic Panel: Recent Labs  Lab 09/24/18 0924  NA 137  K 4.0  CL 100  CO2 28  GLUCOSE 188*  BUN  13  CREATININE 1.17  CALCIUM 9.2   BNP (last 3 results) Recent Labs    09/18/18 0241  BNP 1,247.2*   Urinalysis    Component Value Date/Time   COLORURINE AMBER (A) 09/17/2018 2019   APPEARANCEUR CLOUDY (A) 09/17/2018 2019   APPEARANCEUR Clear 03/07/2013 1849   LABSPEC 1.025 09/17/2018 2019   LABSPEC 1.011 03/07/2013 1849   PHURINE 5.0 09/17/2018 2019   GLUCOSEU NEGATIVE 09/17/2018 2019  GLUCOSEU Negative 03/07/2013 1849   HGBUR LARGE (A) 09/17/2018 2019   BILIRUBINUR NEGATIVE 09/17/2018 2019   BILIRUBINUR Negative 03/07/2013 1849   KETONESUR NEGATIVE 09/17/2018 2019   PROTEINUR 100 (A) 09/17/2018 2019   NITRITE NEGATIVE 09/17/2018 2019   LEUKOCYTESUR LARGE (A) 09/17/2018 2019   LEUKOCYTESUR 2+ 03/07/2013 1849    I discussed in detail with patient spouse at bedside, updated care and answered questions.  Time coordinating discharge: 40 minutes  SIGNED:  Marcellus Scott, MD, FACP, [facility]. [facility]  To contact the attending provider between 7A-7P or the covering provider during after hours 7P-7A, please log into the web site [email or web address] and access using universal [employer] password for that web site. If you do not have the password, please call the hospital operator.

## 2018-10-13 ENCOUNTER — Encounter (INDEPENDENT_AMBULATORY_CARE_PROVIDER_SITE_OTHER): Payer: Self-pay | Admitting: Vascular Surgery

## 2018-10-13 ENCOUNTER — Ambulatory Visit (INDEPENDENT_AMBULATORY_CARE_PROVIDER_SITE_OTHER): Payer: Medicare HMO | Admitting: Vascular Surgery

## 2018-10-13 VITALS — BP 141/75 | HR 81 | Resp 14

## 2018-10-13 DIAGNOSIS — I10 Essential (primary) hypertension: Secondary | ICD-10-CM

## 2018-10-13 DIAGNOSIS — E1159 Type 2 diabetes mellitus with other circulatory complications: Secondary | ICD-10-CM

## 2018-10-13 DIAGNOSIS — I63232 Cerebral infarction due to unspecified occlusion or stenosis of left carotid arteries: Secondary | ICD-10-CM

## 2018-10-13 DIAGNOSIS — I6521 Occlusion and stenosis of right carotid artery: Secondary | ICD-10-CM | POA: Diagnosis not present

## 2018-10-13 DIAGNOSIS — Z87891 Personal history of nicotine dependence: Secondary | ICD-10-CM

## 2018-10-13 DIAGNOSIS — I872 Venous insufficiency (chronic) (peripheral): Secondary | ICD-10-CM

## 2018-10-13 DIAGNOSIS — E785 Hyperlipidemia, unspecified: Secondary | ICD-10-CM

## 2018-10-13 DIAGNOSIS — Z7982 Long term (current) use of aspirin: Secondary | ICD-10-CM

## 2018-10-13 NOTE — Progress Notes (Signed)
MRN : [id number]  Darren Frazier is a 78 y.o. ([birth year]) male who presents with chief complaint of  Chief Complaint  Patient presents with  . Follow-up  .  History of Present Illness:  The patient is seen for follow up evaluation of carotid stenosis.  The patient relates that on August 27, 2018 he experienced left leg weakness.  He presented to the emergency room where he was ultimately transferred to [facility] interventional radiology.  Apparently, he underwent attempts at recanalizing an occluded left internal carotid artery.  Post procedure he required intubation ventilation and maximal critical care support.  He also demonstrated significant progression and was now hemiparetic and a phasic.  Following his hospitalization he has been multiple weeks and rehab.  He now returns to my office for follow-up as he had previously been established here for his lower extremity atherosclerotic occlusive disease.  The patient denies recent amaurosis fugax. There is no interval history of TIA symptoms or focal motor deficits. There is a prior documented CVA as noted above.  The patient is taking enteric-coated aspirin 81 mg daily.  There is no history of migraine headaches. There is no history of seizures.  The patient has a history of coronary artery disease, no recent episodes of angina or shortness of breath. The patient denies PAD or claudication symptoms. There is a history of hyperlipidemia which is being treated with a statin.    CT angiogram from August 27, 2018 suggests a 80% stenosis of the right internal carotid artery and occlusion of the left internal carotid artery from its origin up to the terminus of the internal carotid.  Current Meds  Medication Sig  . albuterol (PROVENTIL) (2.5 MG/3ML) 0.083% nebulizer solution Take 3 mLs (2.5 mg total) by nebulization every 6 (six) hours as needed for wheezing or shortness of breath.  . Amino Acids-Protein Hydrolys (FEEDING  SUPPLEMENT, PRO-STAT SUGAR FREE 64,) LIQD Take 30 mLs by mouth daily.  [name] amiodarone (PACERONE) 200 MG tablet Take 1 tablet (200 mg total) by mouth daily.  [name] apixaban (ELIQUIS) 5 MG TABS tablet Take 1 tablet (5 mg total) by mouth 2 (two) times daily.  . Cholecalciferol (VITAMIN D3) 1000 units CAPS Take 1,000 Units by mouth daily.   [name] ezetimibe (ZETIA) 10 MG tablet Take 1 tablet (10 mg total) by mouth daily.  . feeding supplement, GLUCERNA SHAKE, (GLUCERNA SHAKE) LIQD Take 237 mLs by mouth 3 (three) times daily between meals.  . fluticasone (FLONASE) 50 MCG/ACT nasal spray Place 2 sprays into both nostrils daily as needed for allergies or rhinitis.   [name] levothyroxine (SYNTHROID, LEVOTHROID) 100 MCG tablet Take 1 tablet (100 mcg total) by mouth daily before breakfast.  . metoprolol tartrate (LOPRESSOR) 25 MG tablet Take 2 tablets (50 mg total) by mouth 2 (two) times daily.  . Multiple Vitamin (MULTIVITAMIN WITH MINERALS) TABS tablet Take 1 tablet by mouth daily.  . Omega-3 Fatty Acids (FISH OIL) 1200 MG CAPS Take 1,200 mg by mouth 3 (three) times daily.   . QUEtiapine (SEROQUEL) 50 MG tablet Take 1.5 tablets (75 mg total) by mouth at bedtime.  . senna-docusate (SENOKOT-S) 8.6-50 MG tablet Take 1 tablet by mouth at bedtime as needed for mild constipation.  . vitamin C (ASCORBIC ACID) 500 MG tablet Take 500 mg by mouth daily.    Past Medical History:  Diagnosis Date  . Cardiomyopathy (HCC)   . Diabetes mellitus without complication (HCC)   . Dysrhythmia    atrial fibrillation  .  GERD (gastroesophageal reflux disease)   . H/O: urethral stricture    self caths occasionally  . History of abdominal aortic aneurysm (AAA)    30/33mm in diameter, stable  . History of kidney stones 2017  . Hyperlipidemia   . Hypertension   . Hypothyroidism   . Morbid obesity with BMI of 40.0-44.9, adult (HCC) 2019  . Peripheral vascular disease (HCC) 2019   lymphadema both extremities, ulceration on left big toe    . Stroke (HCC) 2011   x 2 within 6 months. some numbness over upper arms, balance off  . Tremors of nervous system   . Vitamin D deficiency     Past Surgical History:  Procedure Laterality Date  . APPENDECTOMY  1961  . CATARACT EXTRACTION, BILATERAL Bilateral   . CENTRAL LINE  08/29/2018      . COLONOSCOPY    . IR ANGIO INTRA EXTRACRAN SEL COM CAROTID INNOMINATE UNI R MOD SED  08/27/2018  . IR ANGIO VERTEBRAL SEL SUBCLAVIAN INNOMINATE UNI L MOD SED  08/27/2018  . IR CT HEAD LTD  08/27/2018  . IR PERCUTANEOUS ART THROMBECTOMY/INFUSION INTRACRANIAL INC DIAG ANGIO  08/27/2018  . LOWER EXTREMITY ANGIOGRAPHY Left 03/05/2018   Procedure: LOWER EXTREMITY ANGIOGRAPHY;  Surgeon: Renford Dills, MD;  Location: [facility] INVASIVE CV LAB;  Service: Cardiovascular;  Laterality: Left;  . RADIOLOGY WITH ANESTHESIA N/A 08/27/2018   Procedure: IR WITH ANESTHESIA;  Surgeon: Radiologist, Medication, MD;  Location: [facility] OR;  Service: Radiology;  Laterality: N/A;  . TONSILLECTOMY      Social History Social History   Tobacco Use  . Smoking status: Former Smoker    Packs/day: 4.00    Years: 30.00    Pack years: 120.00    Types: Cigarettes    Last attempt to quit: 1984    Years since quitting: 36.1  . Smokeless tobacco: Never Used  Substance Use Topics  . Alcohol use: No    Comment: occasional beer. couple years ago  . Drug use: No    Family History Family History  Problem Relation Age of Onset  . Diabetes Father   No family history of bleeding/clotting disorders, porphyria or autoimmune disease   Allergies  Allergen Reactions  . Ace Inhibitors Other (See Comments)    unknown  . Omeprazole Diarrhea  . Statins Other (See Comments)    Abnormal lab results(pt unsure of what labs)     REVIEW OF SYSTEMS (Negative unless checked)  Constitutional: [] Weight loss  [] Fever  [] Chills Cardiac: [] Chest pain   [] Chest pressure   [] Palpitations   [] Shortness of breath when laying flat    [] Shortness of breath with exertion. Vascular:  [] Pain in legs with walking   [] Pain in legs at rest  [] History of DVT   [] Phlebitis   [] Swelling in legs   [] Varicose veins   [] Non-healing ulcers Pulmonary:   [] Uses home oxygen   [] Productive cough   [] Hemoptysis   [] Wheeze  [] COPD   [] Asthma Neurologic:  [] Dizziness   [] Seizures   [x] History of stroke   [] History of TIA  [x] Aphasia   [] Vissual changes   [x] Weakness or numbness in arm   [x] Weakness or numbness in leg Musculoskeletal:   [] Joint swelling   [] Joint pain   [] Low back pain Hematologic:  [] Easy bruising  [] Easy bleeding   [] Hypercoagulable state   [] Anemic Gastrointestinal:  [] Diarrhea   [] Vomiting  [] Gastroesophageal reflux/heartburn   [] Difficulty swallowing. Genitourinary:  [] Chronic kidney disease   [] Difficult urination  [] Frequent urination   []   Blood in urine Skin:  [] Rashes   [] Ulcers  Psychological:  [] History of anxiety   []  History of major depression.  Physical Examination  Vitals:   10/13/18 1357  BP: (!) 141/75  Pulse: 81  Resp: 14   There is no height or weight on file to calculate BMI. Gen: WD/WN, NAD Head: [place]/AT, No temporalis wasting.  Ear/Nose/Throat: Hearing grossly intact, nares w/o erythema or drainage, poor dentition Eyes: PER, EOMI, sclera nonicteric.  Neck: Supple, no masses.  No bruit or JVD.  Pulmonary:  Good air movement, clear to auscultation bilaterally, no use of accessory muscles.  Cardiac: RRR, normal S1, S2, no Murmurs. Vascular: Right carotid bruit Vessel Right Left  Radial Palpable Palpable  Ulnar Palpable Palpable  Brachial Palpable Palpable  Carotid Palpable Palpable  Gastrointestinal: soft, non-distended. No guarding/no peritoneal signs.  Musculoskeletal: M/S 5/5 throughout.  No deformity or atrophy.  Neurologic: CN 2-12 intact. Pain and light touch intact in extremities.  Symmetrical.  Speech is fluent. Motor exam as listed above. Psychiatric: Judgment intact, Mood & affect  appropriate for pt's clinical situation. Dermatologic: No rashes or ulcers noted.  No changes consistent with cellulitis. Lymph : No Cervical lymphadenopathy, no lichenification or skin changes of chronic lymphedema.  CBC Lab Results  Component Value Date   WBC 8.4 09/22/2018   HGB 10.9 (L) 09/22/2018   HCT 36.0 (L) 09/22/2018   MCV 98.9 09/22/2018   PLT 287 09/22/2018    BMET    Component Value Date/Time   NA 137 09/24/2018 0924   NA 137 03/07/2013 1849   K 4.0 09/24/2018 0924   K 3.6 03/07/2013 1849   CL 100 09/24/2018 0924   CL 101 03/07/2013 1849   CO2 28 09/24/2018 0924   CO2 26 03/07/2013 1849   GLUCOSE 188 (H) 09/24/2018 0924   GLUCOSE 135 (H) 03/07/2013 1849   BUN 13 09/24/2018 0924   BUN 18 03/07/2013 1849   CREATININE 1.17 09/24/2018 0924   CREATININE 1.06 03/07/2013 1849   CALCIUM 9.2 09/24/2018 0924   CALCIUM 9.4 03/07/2013 1849   GFRNONAA 60 (L) 09/24/2018 0924   GFRNONAA >60 03/07/2013 1849   GFRAA >60 09/24/2018 0924   GFRAA >60 03/07/2013 1849   Estimated Creatinine Clearance: 66.7 mL/min (by C-G formula based on SCr of 1.17 mg/dL).  COAG Lab Results  Component Value Date   INR 1.73 09/18/2018   INR 1.50 08/27/2018   INR 1.1 03/07/2013    Radiology Ct Head Wo Contrast  Result Date: 09/17/2018 CLINICAL DATA:  78 year old male presents unresponsive after being discharged earlier today. Recent history of stroke. EXAM: CT HEAD WITHOUT CONTRAST TECHNIQUE: Contiguous axial images were obtained from the base of the skull through the vertex without intravenous contrast. COMPARISON:  CTA head 09/10/2018; brain MRI 08/28/2018 FINDINGS: Brain: No new acute infarct identified. Continued evolution of a previously noted multifocal cortical infarcts without significant interval change or progression compared to 09/10/2018. Periventricular white matter hypoattenuation consistent with chronic microvascular ischemic white matter disease. Stable remote lacunar infarct  of the left caudate head. Vascular: Atherosclerotic calcifications in the bilateral cavernous and supraclinoid internal carotid arteries. No hyperdense vascular sign. Skull: Normal. Negative for fracture or focal lesion. Sinuses/Orbits: No acute finding. Other: None. IMPRESSION: 1. No new acute intracranial abnormality. 2. Expected continued evolution of a multifocal bilateral cortical infarcts. 3. Chronic microvascular ischemic white matter disease. Electronically Signed   By: Malachy MoanHeath  McCullough M.D.   On: 09/17/2018 20:25   Dg Chest Port 1  View  Result Date: 09/19/2018 CLINICAL DATA:  Respiratory failure EXAM: PORTABLE CHEST 1 VIEW COMPARISON:  09/18/2018 FINDINGS: Cardiac shadow is enlarged but stable. Aortic calcifications are again seen. Endotracheal tube, nasogastric catheter and right-sided PICC line are again noted and stable. Inspiratory effort is poor. Left basilar atelectasis and effusion is again noted. Vascular congestion is again seen although extension weighted by the poor inspiratory effort. IMPRESSION: Tubes and lines as described. Stable left basilar atelectasis with vascular congestion. Electronically Signed   By: Alcide Clever M.D.   On: 09/19/2018 07:27   Dg Chest Port 1 View  Result Date: 09/18/2018 CLINICAL DATA:  PICC line placement. EXAM: PORTABLE CHEST 1 VIEW COMPARISON:  09/17/2018. FINDINGS: Endotracheal tube and NG tube in stable position. Right PICC line noted over cavoatrial junction. Cardiomegaly with pulmonary venous congestion and bilateral interstitial prominence. Basilar atelectasis. Findings suggest CHF. Small left pleural effusion. IMPRESSION: 1. Endotracheal tube and NG tube in stable position. Right PICC line noted with tip over cavoatrial junction. 2. Cardiomegaly with pulmonary venous congestion bilateral interstitial prominence consistent CHF. 3.  Bibasilar atelectasis. Electronically Signed   By: Maisie Fus  Register   On: 09/18/2018 11:00   Dg Chest Port 1  View  Result Date: 09/17/2018 CLINICAL DATA:  Post intubation. EXAM: PORTABLE CHEST 1 VIEW COMPARISON:  Radiographs 09/15/2018 and 09/14/2018. FINDINGS: 1949 hours. Endotracheal tube tip is 3.3 cm above the carina. Enteric tube projects below the diaphragm with the tip projecting over the proximal stomach. Cardiomegaly, aortic atherosclerosis and pulmonary edema are again noted. There is mildly increased retrocardiac opacity which could reflect atelectasis or developing airspace disease. No pneumothorax or significant pleural effusion. IMPRESSION: 1. Satisfactory position of the endotracheal and enteric tubes. 2. Persistent edema suggesting congestive heart failure. Increasing retrocardiac pulmonary opacity. Electronically Signed   By: Carey Bullocks M.D.   On: 09/17/2018 20:10   Dg Chest Port 1 View  Result Date: 09/15/2018 CLINICAL DATA:  Shortness of breath, leukocytosis EXAM: PORTABLE CHEST 1 VIEW COMPARISON:  09/14/2018 FINDINGS: Cardiomegaly with vascular congestion and interstitial prominence, likely interstitial edema. This has improved since prior study. Suspect small effusions. No acute bony abnormality. IMPRESSION: Continued interstitial edema, slightly improved since prior study. Suspect small effusions. Electronically Signed   By: Charlett Nose M.D.   On: 09/15/2018 10:55   Dg Chest Port 1 View  Result Date: 09/14/2018 CLINICAL DATA:  History of congestive heart failure EXAM: PORTABLE CHEST 1 VIEW COMPARISON:  Chest radiograph 09/09/2018 FINDINGS: Monitoring leads overlie the patient. Stable cardiomegaly. Low lung volumes. Bilateral interstitial pulmonary opacities. Small bilateral pleural effusions. IMPRESSION: Cardiomegaly and mild interstitial edema. Electronically Signed   By: Annia Belt M.D.   On: 09/14/2018 08:04   Vas [place] Groin Pseudoaneurysm  Result Date: 09/16/2018  ARTERIAL PSEUDOANEURYSM  Exam: Right groin History: Follow up pseudoaneurysm. Comparison Study: 09/04/2018 Performing  Technologist: Jeb Levering RDMS, RVT  Examination Guidelines: A complete evaluation includes B-mode imaging, spectral Doppler, color Doppler, and power Doppler as needed of all accessible portions of each vessel. Bilateral testing is considered an integral part of a complete examination. Limited examinations for reoccurring indications may be performed as noted. +------------+----------+--------+------+----------+ Right DuplexPSV (cm/s)WaveformPlaqueComment(s) +------------+----------+--------+------+----------+ CFA             77    biphasic                 +------------+----------+--------+------+----------+ Prox SFA        65    biphasic                 +------------+----------+--------+------+----------+  Right Vein comments:Patent common femoral vein.  Findings: An area with well defined borders measuring 4.0 cm x 2.2 cm was visualized arising off of the Right CFA with ultrasound characteristics of a pseudoaneurysm. Mixed echos within the structure suggest that it is partially thrombosed with a residual diameter  of 2.5 cm x 1.9 cm. The neck measures approximately 0.6 cm wide and 1.8 cm long.  Diagnosing physician: Coral Else MD Electronically signed by Coral Else MD on 09/16/2018 at 6:30:39 PM.   --------------------------------------------------------------------------------    Final    Vas [place] Lower Ext Arterial Pseudo Injection  Result Date: 09/17/2018  ARTERIAL PSEUDOANEURYSM  Exam: Right groin Performing Technologist: Blanch Media RVS Supporting Technologist: Jeb Levering RDMS, RVT  Examination Guidelines: A complete evaluation includes B-mode imaging, spectral Doppler, color Doppler, and power Doppler as needed of all accessible portions of each vessel. Bilateral testing is considered an integral part of a complete examination. Limited examinations for reoccurring indications may be performed as noted.  Findings: An area with well defined borders measuring 2.1 cm x 2.3 cm was  visualized with ultrasound characteristics of a pseudoaneurysm.  Summary: Successful thrombin injection into right groin pseudoaneurysm. Completed by Dr. Chestine Spore.  Post injection the right SFA is patent.  Diagnosing physician: Gretta Began MD Electronically signed by Gretta Began MD on 09/17/2018 at 8:08:45 PM.   --------------------------------------------------------------------------------    Final    [place] Ekg Site Rite  Result Date: 09/18/2018 If Site Rite image not attached, placement could not be confirmed due to current cardiac rhythm.     Assessment/Plan 1. 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  He has now progressed and has a lesion the is >80% on the right.  Patient should undergo CT angiography of the carotid arteries to define the degree of stenosis of the internal carotid arteries bilaterally and the anatomic suitability for surgery vs. Intervention.  Given recent events and his multiple severe comorbidities stenting would be the preferable method for treatment.  If the patient does indeed need surgery cardiac clearance will be required, once cleared the patient will be scheduled for surgery.  The risks, benefits and alternative therapies were reviewed in detail with the patient.  All questions were answered.  The patient agrees to proceed with imaging.  Continue antiplatelet therapy as prescribed. Continue management of CAD, HTN and Hyperlipidemia. Healthy heart diet, encouraged exercise at least 4 times per week.    A total of 35 minutes was spent with this patient and greater than 50% was spent in counseling and coordination of care with the patient.  Discussion surrounded the very complex recent events and included the treatment options for vascular disease including indications for surgery and intervention.  Also discussed is the appropriate timing of treatment.  In addition medical therapy  was discussed.  - CT ANGIO NECK W OR WO CONTRAST; Future  2. Carotid artery stenosis, symptomatic, right The patient is symptomatic with respect to the carotid stenosis.  He has now progressed and has a lesion the is >80% on the right.  Patient should undergo CT angiography of the carotid arteries to define the degree of stenosis of the internal carotid arteries bilaterally and the anatomic suitability for surgery vs. Intervention.  Given recent events and his multiple severe comorbidities stenting would be the preferable method for treatment.  If the patient does indeed need surgery cardiac clearance will be required, once cleared the patient will be  scheduled for surgery.  The risks, benefits and alternative therapies were reviewed in detail with the patient.  All questions were answered.  The patient agrees to proceed with imaging.  Continue antiplatelet therapy as prescribed. Continue management of CAD, HTN and Hyperlipidemia. Healthy heart diet, encouraged exercise at least 4 times per week.   - CT ANGIO NECK W OR WO CONTRAST; Future  3. Essential hypertension Continue antihypertensive medications as already ordered, these medications have been reviewed and there are no changes at this time.   4. Chronic venous insufficiency No surgery or intervention at this point in time.    I have had a long discussion with the patient regarding venous insufficiency and why it  causes symptoms. I have discussed with the patient the chronic skin changes that accompany venous insufficiency and the long term sequela such as infection and ulceration.  Patient will begin wearing graduated compression stockings class 1 (20-30 mmHg) or compression wraps on a daily basis a prescription was given. The patient will put the stockings on first thing in the morning and removing them in the evening. The patient is instructed specifically not to sleep in the stockings.    In addition, behavioral modification  including several periods of elevation of the lower extremities during the day will be continued. I have demonstrated that proper elevation is a position with the ankles at heart level.  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  Hgb A1C to be monitored as already arranged by primary service   6. Hyperlipidemia, unspecified hyperlipidemia type Continue statin as ordered and reviewed, no changes at this time    Levora DredgeGregory Renell Coaxum, MD  10/13/2018 4:40 PM

## 2018-10-21 ENCOUNTER — Telehealth (INDEPENDENT_AMBULATORY_CARE_PROVIDER_SITE_OTHER): Payer: Self-pay | Admitting: Vascular Surgery

## 2018-10-21 NOTE — Telephone Encounter (Signed)
Please go to appt desk and check the referral. Thank you.

## 2018-10-21 NOTE — Telephone Encounter (Signed)
I DID THERE WASN'T ONE. THE NOTE IS STILL OPEN

## 2018-10-22 ENCOUNTER — Encounter (INDEPENDENT_AMBULATORY_CARE_PROVIDER_SITE_OTHER): Payer: Self-pay | Admitting: Vascular Surgery

## 2018-10-22 DIAGNOSIS — I6521 Occlusion and stenosis of right carotid artery: Secondary | ICD-10-CM | POA: Insufficient documentation

## 2018-10-29 ENCOUNTER — Ambulatory Visit
Admission: RE | Admit: 2018-10-29 | Discharge: 2018-10-29 | Disposition: A | Discharge status: Home / Self Care | Payer: Medicare HMO | Source: Ambulatory Visit | Attending: Vascular Surgery | Admitting: Vascular Surgery

## 2018-10-29 DIAGNOSIS — I63232 Cerebral infarction due to unspecified occlusion or stenosis of left carotid arteries: Secondary | ICD-10-CM | POA: Insufficient documentation

## 2018-10-29 DIAGNOSIS — I6521 Occlusion and stenosis of right carotid artery: Secondary | ICD-10-CM | POA: Diagnosis present

## 2018-10-29 MED ORDER — IOHEXOL 350 MG/ML SOLN
75.0000 mL | Freq: Once | INTRAVENOUS | Status: AC | PRN
  Administered 2018-10-29: 75 mL via INTRAVENOUS

## 2018-10-30 ENCOUNTER — Ambulatory Visit (INDEPENDENT_AMBULATORY_CARE_PROVIDER_SITE_OTHER): Payer: Medicare HMO | Admitting: Vascular Surgery

## 2018-10-30 ENCOUNTER — Encounter (INDEPENDENT_AMBULATORY_CARE_PROVIDER_SITE_OTHER): Payer: Medicare HMO

## 2018-11-17 ENCOUNTER — Ambulatory Visit (INDEPENDENT_AMBULATORY_CARE_PROVIDER_SITE_OTHER): Payer: Medicare HMO

## 2018-11-17 ENCOUNTER — Encounter (INDEPENDENT_AMBULATORY_CARE_PROVIDER_SITE_OTHER): Payer: Self-pay | Admitting: Vascular Surgery

## 2018-11-17 ENCOUNTER — Other Ambulatory Visit: Payer: Self-pay

## 2018-11-17 ENCOUNTER — Ambulatory Visit (INDEPENDENT_AMBULATORY_CARE_PROVIDER_SITE_OTHER): Payer: Medicare HMO | Admitting: Vascular Surgery

## 2018-11-17 VITALS — BP 171/84 | HR 78 | Resp 17 | Ht 70.0 in | Wt 254.0 lb

## 2018-11-17 DIAGNOSIS — Z79899 Other long term (current) drug therapy: Secondary | ICD-10-CM

## 2018-11-17 DIAGNOSIS — I10 Essential (primary) hypertension: Secondary | ICD-10-CM

## 2018-11-17 DIAGNOSIS — Z87891 Personal history of nicotine dependence: Secondary | ICD-10-CM

## 2018-11-17 DIAGNOSIS — Z7982 Long term (current) use of aspirin: Secondary | ICD-10-CM

## 2018-11-17 DIAGNOSIS — I70245 Atherosclerosis of native arteries of left leg with ulceration of other part of foot: Secondary | ICD-10-CM

## 2018-11-17 DIAGNOSIS — I4821 Permanent atrial fibrillation: Secondary | ICD-10-CM | POA: Diagnosis not present

## 2018-11-17 DIAGNOSIS — E785 Hyperlipidemia, unspecified: Secondary | ICD-10-CM

## 2018-11-17 DIAGNOSIS — I6521 Occlusion and stenosis of right carotid artery: Secondary | ICD-10-CM | POA: Diagnosis not present

## 2018-11-17 DIAGNOSIS — L97529 Non-pressure chronic ulcer of other part of left foot with unspecified severity: Secondary | ICD-10-CM

## 2018-11-17 DIAGNOSIS — Z7902 Long term (current) use of antithrombotics/antiplatelets: Secondary | ICD-10-CM

## 2018-11-17 DIAGNOSIS — E1159 Type 2 diabetes mellitus with other circulatory complications: Secondary | ICD-10-CM

## 2018-11-18 ENCOUNTER — Telehealth (INDEPENDENT_AMBULATORY_CARE_PROVIDER_SITE_OTHER): Payer: Self-pay

## 2018-11-18 ENCOUNTER — Encounter (INDEPENDENT_AMBULATORY_CARE_PROVIDER_SITE_OTHER): Payer: Self-pay | Admitting: Vascular Surgery

## 2018-11-18 ENCOUNTER — Encounter (INDEPENDENT_AMBULATORY_CARE_PROVIDER_SITE_OTHER): Payer: Self-pay

## 2018-11-18 NOTE — Telephone Encounter (Signed)
I initally spoke with the patient's wife about having him scheduled for his procedure. The wife stated the patient is at [facility] so I called and spoke with Nedra Hai about the patient's procedure. The patient is scheduled for 11/26/2018 with a 8:45 am arrival time and his pre-op is scheduled for 11/25/2018 with a 9:45 am arrival time. This information has also been faxed to [facility] attn. Nedra Hai.

## 2018-11-18 NOTE — Progress Notes (Signed)
MRN : [id number]  Darren Frazier is a 78 y.o. (05-03-1941) male who presents with chief complaint of  Chief Complaint  Patient presents with   Follow-up    ultrasound  .  History of Present Illness:   The patient is seen for follow up evaluation of carotid stenosis.  Since his office visit on October 13, 2018 there have not been any significant changes in his health.  He continues to do rehab at [facility].  The patient denies amaurosis fugax. There is no recent history of TIA symptoms or focal motor deficits. There is no prior documented CVA.  The patient is taking enteric-coated aspirin 81 mg daily.  There is no history of migraine headaches. There is no history of seizures.  The patient has a history of coronary artery disease, no recent episodes of angina or shortness of breath. The patient denies PAD or claudication symptoms. There is a history of hyperlipidemia which is being treated with a statin.    CT angiogram done on August 27, 2018 is reviewed by me personally it demonstrates an 80% stenosis of the right internal carotid artery with occlusion of the left internal carotid artery.  Current Meds  Medication Sig   albuterol (PROVENTIL) (2.5 MG/3ML) 0.083% nebulizer solution Take 3 mLs (2.5 mg total) by nebulization every 6 (six) hours as needed for wheezing or shortness of breath.   amiodarone (PACERONE) 200 MG tablet Take 1 tablet (200 mg total) by mouth daily.   apixaban (ELIQUIS) 5 MG TABS tablet Take 1 tablet (5 mg total) by mouth 2 (two) times daily.   Cholecalciferol (VITAMIN D3) 1000 units CAPS Take 1,000 Units by mouth daily.    ezetimibe (ZETIA) 10 MG tablet Take 1 tablet (10 mg total) by mouth daily.   fluticasone (FLONASE) 50 MCG/ACT nasal spray Place 2 sprays into both nostrils daily as needed for allergies or rhinitis.    levothyroxine (SYNTHROID, LEVOTHROID) 100 MCG tablet Take 1 tablet (100 mcg total) by mouth daily before breakfast.    metoprolol tartrate (LOPRESSOR) 25 MG tablet Take 2 tablets (50 mg total) by mouth 2 (two) times daily.   Multiple Vitamin (MULTIVITAMIN WITH MINERALS) TABS tablet Take 1 tablet by mouth daily.   Omega-3 Fatty Acids (FISH OIL) 1200 MG CAPS Take 1,200 mg by mouth 3 (three) times daily.    QUEtiapine (SEROQUEL) 50 MG tablet Take 1.5 tablets (75 mg total) by mouth at bedtime.   senna-docusate (SENOKOT-S) 8.6-50 MG tablet Take 1 tablet by mouth at bedtime as needed for mild constipation.   vitamin C (ASCORBIC ACID) 500 MG tablet Take 500 mg by mouth daily.    Past Medical History:  Diagnosis Date   Cardiomyopathy (HCC)    Diabetes mellitus without complication (HCC)    Dysrhythmia    atrial fibrillation   GERD (gastroesophageal reflux disease)    H/O: urethral stricture    self caths occasionally   History of abdominal aortic aneurysm (AAA)    30/79mm in diameter, stable   History of kidney stones 2017   Hyperlipidemia    Hypertension    Hypothyroidism    Morbid obesity with BMI of 40.0-44.9, adult (HCC) 2019   Peripheral vascular disease (HCC) 2019   lymphadema both extremities, ulceration on left big toe   Stroke (HCC) 2011   x 2 within 6 months. some numbness over upper arms, balance off   Tremors of nervous system    Vitamin D deficiency     Past  Surgical History:  Procedure Laterality Date   APPENDECTOMY  1961   CATARACT EXTRACTION, BILATERAL Bilateral    CENTRAL LINE  08/29/2018       COLONOSCOPY     IR ANGIO INTRA EXTRACRAN SEL COM CAROTID INNOMINATE UNI R MOD SED  08/27/2018   IR ANGIO VERTEBRAL SEL SUBCLAVIAN INNOMINATE UNI L MOD SED  08/27/2018   IR CT HEAD LTD  08/27/2018   IR PERCUTANEOUS ART THROMBECTOMY/INFUSION INTRACRANIAL INC DIAG ANGIO  08/27/2018   LOWER EXTREMITY ANGIOGRAPHY Left 03/05/2018   Procedure: LOWER EXTREMITY ANGIOGRAPHY;  Surgeon: Renford Dills, MD;  Location: [facility] INVASIVE CV LAB;  Service: Cardiovascular;   Laterality: Left;   RADIOLOGY WITH ANESTHESIA N/A 08/27/2018   Procedure: IR WITH ANESTHESIA;  Surgeon: Radiologist, Medication, MD;  Location: [facility] OR;  Service: Radiology;  Laterality: N/A;   TONSILLECTOMY      Social History Social History   Tobacco Use   Smoking status: Former Smoker    Packs/day: 4.00    Years: 30.00    Pack years: 120.00    Types: Cigarettes    Last attempt to quit: 1984    Years since quitting: 36.2   Smokeless tobacco: Never Used  Substance Use Topics   Alcohol use: No    Comment: occasional beer. couple years ago   Drug use: No    Family History Family History  Problem Relation Age of Onset   Diabetes Father     Allergies  Allergen Reactions   Ace Inhibitors Other (See Comments)    unknown   Omeprazole Diarrhea   Statins Other (See Comments)    Abnormal lab results(pt unsure of what labs)     REVIEW OF SYSTEMS (Negative unless checked)  Constitutional: Weight loss  Fever  Chills Cardiac: Chest pain   Chest pressure   Palpitations   Shortness of breath when laying flat   Shortness of breath with exertion. Vascular:  Pain in legs with walking   Pain in legs at rest  History of DVT   Phlebitis   Swelling in legs   Varicose veins   Non-healing ulcers Pulmonary:   Uses home oxygen   Productive cough   Hemoptysis   Wheeze  COPD   Asthma Neurologic:  Dizziness   Seizures   History of stroke   History of TIA  Aphasia   Vissual changes   Weakness or numbness in arm   Weakness or numbness in leg Musculoskeletal:   Joint swelling   Joint pain   Low back pain Hematologic:  Easy bruising  Easy bleeding   Hypercoagulable state   Anemic Gastrointestinal:  Diarrhea   Vomiting  Gastroesophageal reflux/heartburn   Difficulty swallowing. Genitourinary:  Chronic kidney disease   Difficult urination  Frequent urination   Blood in urine Skin:  Rashes    Ulcers  Psychological:  History of anxiety    History of major depression.  Physical Examination  Vitals:   11/17/18 1503  BP: (!) 171/84  Pulse: 78  Resp: 17  Weight: 254 lb (115.2 kg)  Height:  (1.778 m)   Body mass index is 36.45 kg/m. Gen: WD/WN, seen in a wheelchair mild distress Head: [place]/AT, No temporalis wasting.  Ear/Nose/Throat: Hearing grossly intact, nares w/o erythema or drainage Eyes: PER, EOMI, sclera nonicteric.  Neck: Supple, no large masses.   Pulmonary:  Good air movement, no audible wheezing bilaterally, no use of accessory muscles.  Cardiac: RRR, no JVD Vascular: Right carotid bruit auscultated on this visit;  ulcers noted bilateral feet. Vessel Right Left  Radial Palpable Palpable  Brachial Palpable Palpable  Carotid Palpable Palpable  Gastrointestinal: Non-distended. No guarding/no peritoneal signs.  Musculoskeletal: M/S 5/5 throughout the left side 3 out of 5 noted for the right arm and right leg.  No deformity or atrophy.  Neurologic: CN 2-12 intact. Symmetrical.  Speech is fluent. Motor exam as listed above. Psychiatric: Judgment intact, Mood & affect appropriate for pt's clinical situation. Dermatologic: No rashes positive ulcers noted.  No changes consistent with cellulitis. Lymph : No lichenification or skin changes of chronic lymphedema.  CBC Lab Results  Component Value Date   WBC 8.4 09/22/2018   HGB 10.9 (L) 09/22/2018   HCT 36.0 (L) 09/22/2018   MCV 98.9 09/22/2018   PLT 287 09/22/2018    BMET    Component Value Date/Time   NA 137 09/24/2018 0924   NA 137 03/07/2013 1849   K 4.0 09/24/2018 0924   K 3.6 03/07/2013 1849   CL 100 09/24/2018 0924   CL 101 03/07/2013 1849   CO2 28 09/24/2018 0924   CO2 26 03/07/2013 1849   GLUCOSE 188 (H) 09/24/2018 0924   GLUCOSE 135 (H) 03/07/2013 1849   BUN 13 09/24/2018 0924   BUN 18 03/07/2013 1849   CREATININE 1.17 09/24/2018 0924   CREATININE 1.06 03/07/2013 1849   CALCIUM 9.2  09/24/2018 0924   CALCIUM 9.4 03/07/2013 1849   GFRNONAA 60 (L) 09/24/2018 0924   GFRNONAA >60 03/07/2013 1849   GFRAA >60 09/24/2018 0924   GFRAA >60 03/07/2013 1849   CrCl cannot be calculated (Patient's most recent lab result is older than the maximum 21 days allowed.).  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 Carotid artery stenosis, symptomatic, right. EXAM: CT ANGIOGRAPHY NECK TECHNIQUE: Multidetector CT imaging of the neck was performed using the standard protocol during bolus administration of intravenous contrast. Multiplanar CT image reconstructions and MIPs were obtained to evaluate the vascular anatomy. Carotid stenosis measurements (when applicable) are obtained utilizing NASCET criteria, using the distal internal carotid diameter as the denominator. CONTRAST:  44mL OMNIPAQUE IOHEXOL 350 MG/ML SOLN COMPARISON:  . MRI brain 08/28/2018. Cerebral arteriogram 08/27/2018. CTA of the head and neck 08/27/2018. FINDINGS: Aortic arch: There is a common origin of the innominate artery and the left common carotid artery. Atherosclerotic changes are present in the distal arch. Left subclavian artery is within normal limits. Right carotid system: Atherosclerotic changes are present in the right common carotid artery without a significant stenosis relative to the more distal vessel. Calcified and noncalcified plaque is present in the right carotid bifurcation. There is a high-grade, near complete scratched at there is a high-grade stenosis of the proximal right internal carotid artery. The lumen is narrowed to less than 1 mm. Moderate tortuosity of the more distal cervical right ICA is again noted without a tandem stenosis. Left carotid system: The left common carotid artery is occluded.  Vertebral arteries: The left vertebral artery is dominant. Right vertebral artery is hypoplastic. There is a moderate stenosis of the left vertebral artery at its origin. No tandem stenosis are present in the neck. There is a high-grade stenosis of the V3 segment and tandem stenosis at the V4 segment. There is marked irregularity throughout  the basilar artery. Skeleton: Degenerative changes of the cervical spine are stable. No focal lytic or blastic lesions are present. Other neck: Subcentimeter paratracheal lymph nodes are stable. No significant mass lesion is present. Salivary glands are within normal limits. Thyroid is normal. Upper chest: Centrilobular emphysematous changes are present. Diffuse ground-glass attenuation is present, likely edema. IMPRESSION: 1. High-grade, near occlusive stenosis of the proximal right internal carotid artery. Lumen is narrowed to less than 1 mm. 2. Occluded left internal carotid artery without reconstitution in the neck. 3. 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 4. Diffuse irregularity of the basilar artery. 5. 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 Electronically Signed   By: Marin Roberts M.D.   On: 10/29/2018 17:25    Assessment/Plan 1. Carotid artery stenosis, symptomatic, right Recommend:  The patient is symptomatic with respect to the critical right internal carotid stenosis.  The patient now has progressed and has a lesion the is >90%.  Patient's CT angiography of the carotid arteries confirms >90% right ICA stenosis.  The anatomical situation as well as the contralateral occluded occlusion and his recent hospitalization with question of coronary ischemia and CHF support stenting over surgery.  This was discussed in detail with the patient.  The risks, benefits and alternative therapies were reviewed in detail with the patient.  All questions were answered.  The patient agrees to proceed  with stenting of the right carotid artery.  Continue antiplatelet therapy as prescribed. Continue management of CAD, HTN and Hyperlipidemia. Healthy heart diet, encouraged exercise at least 4 times per week.   2. 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  ABIs done today suggest borderline but possibly adequate perfusion for wound healing and I will move forward with treatment of his carotid disease before investigating any angiography and/or intervention.  No invasive studies, angiography or surgery at this time The patient should continue walking and begin a more formal exercise program.  The patient should continue antiplatelet therapy and aggressive treatment of the lipid abnormalities  No changes in the patient's medications at this time  The patient should continue wearing graduated compression socks 10-15 mmHg strength to control the mild edema.    3. Permanent atrial fibrillation Continue antiarrhythmia medications as already ordered, these medications have been reviewed and there are no changes at this time.  Continue anticoagulation as ordered by Cardiology Service   4. Essential hypertension Continue antihypertensive medications as already ordered, these medications have been reviewed and there are no changes at this time.   5. Type 2 diabetes mellitus with other circulatory complication, unspecified whether long term insulin use (HCC) Continue hypoglycemic medications as already ordered, these medications have been reviewed and there are no changes at this time.  Hgb A1C to be monitored as already arranged by primary service   6. Hyperlipidemia, unspecified hyperlipidemia type Continue statin as ordered and reviewed, no changes at this time     Levora Dredge, MD  11/18/2018 10:07 AM

## 2018-11-19 ENCOUNTER — Inpatient Hospital Stay: Payer: Medicare HMO | Admitting: Adult Health

## 2018-11-25 ENCOUNTER — Other Ambulatory Visit (INDEPENDENT_AMBULATORY_CARE_PROVIDER_SITE_OTHER): Payer: Self-pay | Admitting: Nurse Practitioner

## 2018-11-25 ENCOUNTER — Encounter
Admission: RE | Admit: 2018-11-25 | Discharge: 2018-11-25 | Disposition: A | Discharge status: Home / Self Care | Payer: Medicare HMO | Source: Ambulatory Visit | Attending: Vascular Surgery | Admitting: Vascular Surgery

## 2018-11-25 ENCOUNTER — Other Ambulatory Visit: Payer: Self-pay

## 2018-11-25 DIAGNOSIS — Z01812 Encounter for preprocedural laboratory examination: Secondary | ICD-10-CM | POA: Insufficient documentation

## 2018-11-25 DIAGNOSIS — I6529 Occlusion and stenosis of unspecified carotid artery: Secondary | ICD-10-CM

## 2018-11-25 HISTORY — DX: Heart failure, unspecified: I50.9

## 2018-11-25 HISTORY — DX: Acute respiratory failure, unspecified whether with hypoxia or hypercapnia: J96.00

## 2018-11-25 HISTORY — DX: Dysphagia, unspecified: R13.10

## 2018-11-25 HISTORY — DX: Encephalopathy, unspecified: G93.40

## 2018-11-25 HISTORY — DX: Sleep apnea, unspecified: G47.30

## 2018-11-25 HISTORY — DX: Hemiplegia, unspecified affecting right dominant side: G81.91

## 2018-11-25 LAB — CREATININE, SERUM
Creatinine, Ser: 1.05 mg/dL (ref 0.61–1.24)
GFR calc Af Amer: >60 mL/min (ref >60)
GFR calc non Af Amer: >60 mL/min (ref >60)

## 2018-11-25 LAB — BUN: BUN: 14 mg/dL (ref 8–23)

## 2018-11-25 MED ORDER — CEFAZOLIN SODIUM-DEXTROSE 1-4 GM/50ML-% IV SOLN
1.0000 g | Freq: Once | INTRAVENOUS | Status: AC
  Administered 2018-11-26: 1 g via INTRAVENOUS

## 2018-11-25 NOTE — Telephone Encounter (Signed)
I spoke with Darren Frazier at [facility], the patient was taken to [facility] his pre-op today and was told to call our office to get his instructions. Eunice Blase was confused as was I because the patient was there for lab work before his procedure on Wednesday. Eunice Blase has the instruction sheet for the patient and knows about his medication as well as the zero visitor policy.

## 2018-11-26 ENCOUNTER — Encounter
Admission: AD | Disposition: A | Discharge status: Other Acute Inpatient Hospital | Payer: Self-pay | Source: Home / Self Care | Attending: Vascular Surgery

## 2018-11-26 ENCOUNTER — Other Ambulatory Visit: Payer: Self-pay

## 2018-11-26 ENCOUNTER — Inpatient Hospital Stay
Admission: AD | Admit: 2018-11-26 | Discharge: 2018-11-28 | DRG: 035 | Disposition: A | Discharge status: Other Acute Inpatient Hospital | Payer: Medicare HMO | Attending: Vascular Surgery | Admitting: Vascular Surgery

## 2018-11-26 DIAGNOSIS — Z8679 Personal history of other diseases of the circulatory system: Secondary | ICD-10-CM | POA: Diagnosis not present

## 2018-11-26 DIAGNOSIS — Z87442 Personal history of urinary calculi: Secondary | ICD-10-CM

## 2018-11-26 DIAGNOSIS — I4891 Unspecified atrial fibrillation: Secondary | ICD-10-CM | POA: Diagnosis present

## 2018-11-26 DIAGNOSIS — Z79899 Other long term (current) drug therapy: Secondary | ICD-10-CM

## 2018-11-26 DIAGNOSIS — Z79890 Hormone replacement therapy (postmenopausal): Secondary | ICD-10-CM | POA: Diagnosis not present

## 2018-11-26 DIAGNOSIS — E039 Hypothyroidism, unspecified: Secondary | ICD-10-CM | POA: Diagnosis present

## 2018-11-26 DIAGNOSIS — Z7951 Long term (current) use of inhaled steroids: Secondary | ICD-10-CM | POA: Diagnosis not present

## 2018-11-26 DIAGNOSIS — I6521 Occlusion and stenosis of right carotid artery: Secondary | ICD-10-CM | POA: Diagnosis present

## 2018-11-26 DIAGNOSIS — Z9889 Other specified postprocedural states: Secondary | ICD-10-CM

## 2018-11-26 DIAGNOSIS — I110 Hypertensive heart disease with heart failure: Secondary | ICD-10-CM | POA: Diagnosis present

## 2018-11-26 DIAGNOSIS — E1151 Type 2 diabetes mellitus with diabetic peripheral angiopathy without gangrene: Secondary | ICD-10-CM | POA: Diagnosis present

## 2018-11-26 DIAGNOSIS — I509 Heart failure, unspecified: Secondary | ICD-10-CM | POA: Diagnosis present

## 2018-11-26 DIAGNOSIS — E785 Hyperlipidemia, unspecified: Secondary | ICD-10-CM | POA: Diagnosis present

## 2018-11-26 DIAGNOSIS — I63232 Cerebral infarction due to unspecified occlusion or stenosis of left carotid arteries: Secondary | ICD-10-CM

## 2018-11-26 DIAGNOSIS — Z7901 Long term (current) use of anticoagulants: Secondary | ICD-10-CM

## 2018-11-26 DIAGNOSIS — G8191 Hemiplegia, unspecified affecting right dominant side: Secondary | ICD-10-CM | POA: Diagnosis present

## 2018-11-26 DIAGNOSIS — Z8673 Personal history of transient ischemic attack (TIA), and cerebral infarction without residual deficits: Secondary | ICD-10-CM | POA: Diagnosis not present

## 2018-11-26 DIAGNOSIS — I63239 Cerebral infarction due to unspecified occlusion or stenosis of unspecified carotid arteries: Secondary | ICD-10-CM | POA: Diagnosis present

## 2018-11-26 DIAGNOSIS — I6523 Occlusion and stenosis of bilateral carotid arteries: Secondary | ICD-10-CM | POA: Diagnosis present

## 2018-11-26 HISTORY — PX: CAROTID PTA/STENT INTERVENTION: CATH118231

## 2018-11-26 LAB — GLUCOSE, CAPILLARY: GLUCOSE-CAPILLARY: 175 mg/dL — AB (ref 70–99)

## 2018-11-26 LAB — POCT ACTIVATED CLOTTING TIME: Activated Clotting Time: 252 seconds

## 2018-11-26 LAB — MRSA PCR SCREENING: MRSA by PCR: POSITIVE — AB

## 2018-11-26 SURGERY — CAROTID PTA/STENT INTERVENTION
Anesthesia: Moderate Sedation

## 2018-11-26 MED ORDER — ESMOLOL HCL-SODIUM CHLORIDE 2000 MG/100ML IV SOLN
INTRAVENOUS | Status: AC
  Filled 2018-11-26: qty 100

## 2018-11-26 MED ORDER — ACETAMINOPHEN 650 MG RE SUPP: 325.0000 mg | RECTAL | Status: DC | PRN

## 2018-11-26 MED ORDER — PHENOL 1.4 % MT LIQD
1.0000 | OROMUCOSAL | Status: DC | PRN
  Filled 2018-11-26: qty 177

## 2018-11-26 MED ORDER — MIDAZOLAM HCL 2 MG/2ML IJ SOLN
INTRAMUSCULAR | Status: DC | PRN
  Administered 2018-11-26: 0.5 mg via INTRAVENOUS
  Administered 2018-11-26: 2 mg via INTRAVENOUS

## 2018-11-26 MED ORDER — MIDAZOLAM HCL 2 MG/ML PO SYRP: 8.0000 mg | ORAL_SOLUTION | Freq: Once | ORAL | Status: DC | PRN

## 2018-11-26 MED ORDER — MORPHINE SULFATE (PF) 4 MG/ML IV SOLN: 2.0000 mg | INTRAVENOUS | Status: DC | PRN

## 2018-11-26 MED ORDER — HEPARIN SODIUM (PORCINE) 1000 UNIT/ML IJ SOLN
INTRAMUSCULAR | Status: DC | PRN
  Administered 2018-11-26: 7000 [IU] via INTRAVENOUS
  Administered 2018-11-26: 3000 [IU] via INTRAVENOUS

## 2018-11-26 MED ORDER — METOPROLOL TARTRATE 50 MG PO TABS
50.0000 mg | ORAL_TABLET | Freq: Two times a day (BID) | ORAL | Status: DC
  Administered 2018-11-26 – 2018-11-28 (×5): 50 mg via ORAL
  Filled 2018-11-26 (×5): qty 1

## 2018-11-26 MED ORDER — APIXABAN 5 MG PO TABS
5.0000 mg | ORAL_TABLET | Freq: Two times a day (BID) | ORAL | Status: DC
  Administered 2018-11-27 – 2018-11-28 (×3): 5 mg via ORAL
  Filled 2018-11-26 (×3): qty 1

## 2018-11-26 MED ORDER — DOPAMINE-DEXTROSE 3.2-5 MG/ML-% IV SOLN
INTRAVENOUS | Status: AC
  Filled 2018-11-26: qty 250

## 2018-11-26 MED ORDER — SODIUM CHLORIDE 0.9 % IV SOLN: 500.0000 mL | Freq: Once | INTRAVENOUS | Status: DC | PRN

## 2018-11-26 MED ORDER — METHYLPREDNISOLONE SODIUM SUCC 125 MG IJ SOLR: 125.0000 mg | Freq: Once | INTRAMUSCULAR | Status: DC | PRN

## 2018-11-26 MED ORDER — OXYCODONE HCL 5 MG PO TABS: 5.0000 mg | ORAL_TABLET | ORAL | Status: DC | PRN

## 2018-11-26 MED ORDER — HEPARIN SODIUM (PORCINE) 1000 UNIT/ML IJ SOLN
INTRAMUSCULAR | Status: AC
  Filled 2018-11-26: qty 1

## 2018-11-26 MED ORDER — ALBUTEROL SULFATE (2.5 MG/3ML) 0.083% IN NEBU: 2.5000 mg | INHALATION_SOLUTION | Freq: Four times a day (QID) | RESPIRATORY_TRACT | Status: DC | PRN

## 2018-11-26 MED ORDER — FAMOTIDINE 20 MG PO TABS: 40.0000 mg | ORAL_TABLET | Freq: Once | ORAL | Status: DC | PRN

## 2018-11-26 MED ORDER — MIDAZOLAM HCL 5 MG/5ML IJ SOLN
INTRAMUSCULAR | Status: AC
  Filled 2018-11-26: qty 5

## 2018-11-26 MED ORDER — HYDRALAZINE HCL 20 MG/ML IJ SOLN
5.0000 mg | INTRAMUSCULAR | Status: DC | PRN
  Administered 2018-11-28: 5 mg via INTRAVENOUS
  Filled 2018-11-26 (×2): qty 1

## 2018-11-26 MED ORDER — CLOPIDOGREL BISULFATE 75 MG PO TABS
300.0000 mg | ORAL_TABLET | ORAL | Status: AC
  Administered 2018-11-26: 300 mg via ORAL
  Filled 2018-11-26: qty 4

## 2018-11-26 MED ORDER — SENNOSIDES-DOCUSATE SODIUM 8.6-50 MG PO TABS
1.0000 | ORAL_TABLET | Freq: Every evening | ORAL | Status: DC | PRN
  Filled 2018-11-26: qty 1

## 2018-11-26 MED ORDER — QUETIAPINE FUMARATE 25 MG PO TABS
75.0000 mg | ORAL_TABLET | Freq: Every day | ORAL | Status: DC
  Administered 2018-11-26 – 2018-11-27 (×2): 75 mg via ORAL
  Filled 2018-11-26 (×2): qty 3

## 2018-11-26 MED ORDER — PHENYLEPHRINE HCL 10 MG/ML IJ SOLN
INTRAMUSCULAR | Status: AC
  Filled 2018-11-26: qty 1

## 2018-11-26 MED ORDER — VITAMIN C 500 MG PO TABS
500.0000 mg | ORAL_TABLET | Freq: Every day | ORAL | Status: DC
  Administered 2018-11-26 – 2018-11-27 (×2): 500 mg via ORAL
  Filled 2018-11-26: qty 2
  Filled 2018-11-26 (×2): qty 1
  Filled 2018-11-26: qty 2

## 2018-11-26 MED ORDER — ACETAMINOPHEN 325 MG PO TABS
325.0000 mg | ORAL_TABLET | ORAL | Status: DC | PRN
  Administered 2018-11-27 – 2018-11-28 (×3): 650 mg via ORAL
  Filled 2018-11-26 (×3): qty 2

## 2018-11-26 MED ORDER — ONDANSETRON HCL 4 MG/2ML IJ SOLN: 4.0000 mg | Freq: Four times a day (QID) | INTRAMUSCULAR | Status: DC | PRN

## 2018-11-26 MED ORDER — DOCUSATE SODIUM 100 MG PO CAPS
100.0000 mg | ORAL_CAPSULE | Freq: Every day | ORAL | Status: DC
  Administered 2018-11-27 – 2018-11-28 (×2): 100 mg via ORAL
  Filled 2018-11-26 (×2): qty 1

## 2018-11-26 MED ORDER — ADULT MULTIVITAMIN W/MINERALS CH
1.0000 | ORAL_TABLET | Freq: Every day | ORAL | Status: DC
  Administered 2018-11-26 – 2018-11-28 (×3): 1 via ORAL
  Filled 2018-11-26 (×3): qty 1

## 2018-11-26 MED ORDER — CHLORHEXIDINE GLUCONATE CLOTH 2 % EX PADS
6.0000 | MEDICATED_PAD | Freq: Every day | CUTANEOUS | Status: DC
  Administered 2018-11-27: 6 via TOPICAL

## 2018-11-26 MED ORDER — LEVOTHYROXINE SODIUM 100 MCG PO TABS
100.0000 ug | ORAL_TABLET | Freq: Every day | ORAL | Status: DC
  Administered 2018-11-27 – 2018-11-28 (×2): 100 ug via ORAL
  Filled 2018-11-26 (×2): qty 1

## 2018-11-26 MED ORDER — MUPIROCIN 2 % EX OINT
1.0000 "application " | TOPICAL_OINTMENT | Freq: Two times a day (BID) | CUTANEOUS | Status: DC
  Administered 2018-11-26 – 2018-11-27 (×3): 1 via NASAL
  Filled 2018-11-26: qty 22

## 2018-11-26 MED ORDER — CLOPIDOGREL BISULFATE 75 MG PO TABS
75.0000 mg | ORAL_TABLET | Freq: Every day | ORAL | Status: DC
  Administered 2018-11-27 – 2018-11-28 (×2): 75 mg via ORAL
  Filled 2018-11-26 (×2): qty 1

## 2018-11-26 MED ORDER — VITAMIN D3 25 MCG (1000 UNIT) PO TABS
1000.0000 [IU] | ORAL_TABLET | Freq: Every day | ORAL | Status: DC
  Administered 2018-11-26 – 2018-11-27 (×2): 1000 [IU] via ORAL
  Filled 2018-11-26 (×6): qty 1

## 2018-11-26 MED ORDER — HEPARIN (PORCINE) IN NACL 1000-0.9 UT/500ML-% IV SOLN
INTRAVENOUS | Status: AC
  Filled 2018-11-26: qty 500

## 2018-11-26 MED ORDER — LIDOCAINE HCL (PF) 1 % IJ SOLN
INTRAMUSCULAR | Status: AC
  Filled 2018-11-26: qty 30

## 2018-11-26 MED ORDER — EZETIMIBE 10 MG PO TABS
10.0000 mg | ORAL_TABLET | Freq: Every day | ORAL | Status: DC
  Administered 2018-11-26 – 2018-11-27 (×2): 10 mg via ORAL
  Filled 2018-11-26 (×4): qty 1

## 2018-11-26 MED ORDER — CEFAZOLIN SODIUM-DEXTROSE 2-4 GM/100ML-% IV SOLN
2.0000 g | Freq: Three times a day (TID) | INTRAVENOUS | Status: AC
  Administered 2018-11-26 – 2018-11-27 (×2): 2 g via INTRAVENOUS
  Filled 2018-11-26 (×3): qty 100

## 2018-11-26 MED ORDER — KCL IN DEXTROSE-NACL 20-5-0.9 MEQ/L-%-% IV SOLN
INTRAVENOUS | Status: DC
  Administered 2018-11-26 – 2018-11-27 (×2): via INTRAVENOUS
  Filled 2018-11-26 (×4): qty 1000

## 2018-11-26 MED ORDER — GUAIFENESIN-DM 100-10 MG/5ML PO SYRP: 15.0000 mL | ORAL_SOLUTION | ORAL | Status: DC | PRN

## 2018-11-26 MED ORDER — FLUTICASONE PROPIONATE 50 MCG/ACT NA SUSP
2.0000 | Freq: Every day | NASAL | Status: DC | PRN
  Filled 2018-11-26: qty 16

## 2018-11-26 MED ORDER — ATROPINE SULFATE 1 MG/10ML IJ SOSY
PREFILLED_SYRINGE | INTRAMUSCULAR | Status: AC
  Filled 2018-11-26: qty 10

## 2018-11-26 MED ORDER — METOPROLOL TARTRATE 5 MG/5ML IV SOLN
2.0000 mg | INTRAVENOUS | Status: DC | PRN
  Administered 2018-11-26: 5 mg via INTRAVENOUS
  Filled 2018-11-26: qty 5

## 2018-11-26 MED ORDER — FENTANYL CITRATE (PF) 100 MCG/2ML IJ SOLN
INTRAMUSCULAR | Status: AC
  Filled 2018-11-26: qty 2

## 2018-11-26 MED ORDER — IOHEXOL 300 MG/ML  SOLN
INTRAMUSCULAR | Status: DC | PRN
  Administered 2018-11-26: 100 mL via INTRAVENOUS

## 2018-11-26 MED ORDER — SODIUM CHLORIDE 0.9 % IV SOLN
INTRAVENOUS | Status: DC
  Administered 2018-11-26: 1000 mL via INTRAVENOUS

## 2018-11-26 MED ORDER — AMIODARONE HCL 200 MG PO TABS
200.0000 mg | ORAL_TABLET | Freq: Every day | ORAL | Status: DC
  Administered 2018-11-26 – 2018-11-28 (×3): 200 mg via ORAL
  Filled 2018-11-26 (×4): qty 1

## 2018-11-26 MED ORDER — LABETALOL HCL 5 MG/ML IV SOLN
10.0000 mg | INTRAVENOUS | Status: AC | PRN
  Administered 2018-11-26 – 2018-11-27 (×4): 10 mg via INTRAVENOUS
  Filled 2018-11-26 (×3): qty 4

## 2018-11-26 MED ORDER — FENTANYL CITRATE (PF) 100 MCG/2ML IJ SOLN
INTRAMUSCULAR | Status: DC | PRN
  Administered 2018-11-26: 50 ug via INTRAVENOUS
  Administered 2018-11-26: 25 ug via INTRAVENOUS

## 2018-11-26 MED ORDER — ALUM & MAG HYDROXIDE-SIMETH 200-200-20 MG/5ML PO SUSP: 15.0000 mL | ORAL | Status: DC | PRN

## 2018-11-26 MED ORDER — BENZONATATE 100 MG PO CAPS
100.0000 mg | ORAL_CAPSULE | Freq: Three times a day (TID) | ORAL | Status: DC
  Administered 2018-11-26 – 2018-11-28 (×5): 100 mg via ORAL
  Filled 2018-11-26 (×5): qty 1

## 2018-11-26 MED ORDER — HYDROMORPHONE HCL 1 MG/ML IJ SOLN: 1.0000 mg | Freq: Once | INTRAMUSCULAR | Status: DC | PRN

## 2018-11-26 MED ORDER — DIPHENHYDRAMINE HCL 50 MG/ML IJ SOLN: 50.0000 mg | Freq: Once | INTRAMUSCULAR | Status: DC | PRN

## 2018-11-26 SURGICAL SUPPLY — 21 items
BALLN VIATRAC 5X20X135 (BALLOONS) ×3
BALLOON VIATRAC 5X20X135 (BALLOONS) ×1 IMPLANT
CATH ANGIO 5F 100CM .035 PIG (CATHETERS) ×3 IMPLANT
CATH BEACON 5 .035 100 H1 TIP (CATHETERS) ×6 IMPLANT
COVER PROBE U/S 5X48 (MISCELLANEOUS) ×3 IMPLANT
DEVICE EMBOSHIELD NAV6 4.0-7.0 (FILTER) ×3 IMPLANT
DEVICE PRESTO INFLATION (MISCELLANEOUS) ×3 IMPLANT
DEVICE STARCLOSE SE CLOSURE (Vascular Products) ×3 IMPLANT
DEVICE TORQUE .025-.038 (MISCELLANEOUS) ×3 IMPLANT
GLIDEWIRE ANGLED SS 035X260CM (WIRE) ×3 IMPLANT
KIT CAROTID MANIFOLD (MISCELLANEOUS) ×3 IMPLANT
NEEDLE ENTRY 21GA 7CM ECHOTIP (NEEDLE) ×3 IMPLANT
PACK ANGIOGRAPHY (CUSTOM PROCEDURE TRAY) ×3 IMPLANT
SET INTRO CAPELLA COAXIAL (SET/KITS/TRAYS/PACK) ×3 IMPLANT
SHEATH BRITE TIP 6FRX11 (SHEATH) ×3 IMPLANT
SHEATH SHUTTLE 6FR (SHEATH) ×3 IMPLANT
STENT XACT CAR 10-8X40X136 (Permanent Stent) ×3 IMPLANT
TOWEL OR 17X26 4PK STRL BLUE (TOWEL DISPOSABLE) ×3 IMPLANT
TUBING CONTRAST HIGH PRESS 72 (TUBING) ×3 IMPLANT
WIRE AMPLATZ SSTIFF .035X260CM (WIRE) ×3 IMPLANT
WIRE J 3MM .035X145CM (WIRE) ×3 IMPLANT

## 2018-11-26 NOTE — Op Note (Signed)
OPERATIVE NOTE DATE: 11/26/2018  PROCEDURE: 1.  Ultrasound guidance for vascular access right femoral artery 2.  Placement of a 10 x 8 x 40 exact stent with the use of the NAV-6 embolic protection device in the right internal carotid artery  PRE-OPERATIVE DIAGNOSIS: 1.  Greater than 90% right carotid artery stenosis. 2.  Occlusion of the left internal carotid artery with CVA approximately 6 weeks ago  POST-OPERATIVE DIAGNOSIS:  Same as above  SURGEON: Levora Dredge, MD  ASSISTANT(S): None  ANESTHESIA: local/MCS  ESTIMATED BLOOD LOSS: 75 cc  CONTRAST: 100 cc  FLUORO TIME: 11.6 minutes  MODERATE CONSCIOUS SEDATION TIME:  Approximately 50 minutes using approximately 4 mg of Versed and 200 Mcg of Fentanyl  FINDING(S): 1.   Greater than 90% right carotid artery stenosis associated with recent occlusion of the left internal carotid artery.  SPECIMEN(S):   none  INDICATIONS:   Patient is a 78 y.o. male who presents with greater than 90% right carotid artery stenosis associated with recent occlusion of the left internal carotid artery.  The patient has a left-sided occlusion with recent stroke and associated with a very high lesion associated with significant tortuosity and carotid artery stenting was felt to be preferred to endarterectomy for that reason.  Risks and benefits were discussed and informed consent was obtained.   DESCRIPTION: After obtaining full informed written consent, the patient was brought back to the vascular suite and placed supine upon the table.  The patient received IV antibiotics prior to induction. Moderate conscious sedation was administered during a face to face encounter with the patient throughout the procedure with my supervision of the RN administering medicines and monitoring the patients vital signs and mental status throughout from the start of the procedure until the patient was taken to the recovery room.  After obtaining adequate anesthesia,  the patient was prepped and draped in the standard fashion.   The right femoral artery was visualized with ultrasound and found to be widely patent. It was then accessed under direct ultrasound guidance without difficulty with a micropuncture needle. A permanent image was recorded.  Microwire was then advanced and verified under fluoroscopy followed by placement of a micro-sheath.  A J-wire was placed and we then placed a 6 French sheath.   The patient was then heparinized and a total of 10,000 units of intravenous heparin were given and an ACT was checked to confirm successful anticoagulation. A pigtail catheter was then placed into the ascending aorta. This showed a type I arch with bovine configuration. I then selectively cannulated the right common carotid artery without difficulty with a H1 catheter and advanced into the mid right common carotid artery.    Cervical and cerebral carotid angiography was then performed. There were no obvious intracranial filling defects with visualization of the left anterior tibial on the [facility]' view but no further cross-filling was noted. The carotid bifurcation demonstrated greater than 90% right internal carotid artery stenosis located at the origin and associated with significant tortuosity more distally.  I then advanced into the external carotid artery with a Glidewire and the H1 catheter and then exchanged for the Amplatz Super Stiff wire.   Over the Amplatz Super Stiff wire, a 6 [place] shuttle sheath was placed into the mid common carotid artery. I then used the NAV-6  Embolic protection device and crossed the lesion and parked this in the distal internal carotid artery at the base of the skull.  I then selected a 10 x 8  x 40 exact stent. This was deployed across the lesion encompassing it in its entirety. A 5 x 20 mm length balloon was used to post dilate the stent. Only about a 15% residual stenosis was present after angioplasty. Completion angiogram showed  normal intracranial filling without new defects, there is now brisk cross-filling and the left middle cerebral also fills with right-sided injection.   At this point I elected to terminate the procedure. The sheath was removed and StarClose closure device was deployed in the right femoral artery with excellent hemostatic result. The patient was taken to the recovery room in stable condition having tolerated the procedure well.  COMPLICATIONS: none  CONDITION: stable  Levora Dredge 11/26/2018 11:35 AM   This note was created with Dragon Medical transcription system. Any errors in dictation are purely unintentional.

## 2018-11-26 NOTE — H&P (Signed)
[facility] History & Physical Update  The patient was interviewed and re-examined.  The patient's previous History and Physical has been reviewed and is unchanged.  There is no change in the plan of care. We plan to proceed with the scheduled procedure.  Levora Dredge, MD  11/26/2018, 9:58 AM

## 2018-11-26 NOTE — Progress Notes (Signed)
[facility] Daily Progress Note   Subjective: Day of Surgery: 1.  Ultrasound guidance for vascular access right femoral artery 2.  Placement of a 10 x 8 x 40 exact stent with the use of the NAV-6 embolic protection device in the right internal carotid artery  Patient without complaint. Resting comfortably.  Objective: Vitals:   11/26/18 1215 11/26/18 1230 11/26/18 1300 11/26/18 1432  BP: (!) 150/80 (!) 152/70 (!) 154/79 (!) 164/138  Pulse: 87 87 87   Resp: 20 18 (!) 21   Temp:   98.1 F (36.7 C)   TempSrc:   Axillary   SpO2: 94% 96% 90%   Weight:   119.1 kg   Height:   5\' 9"  (1.753 m)     Intake/Output Summary (Last 24 hours) at 11/26/2018 1449 Last data filed at 11/26/2018 1100 Gross per 24 hour  Intake -  Output 400 ml  Net -400 ml   Physical Exam: A&Ox3, NAD Face: Symmetrical, Tongue midline CV: RRR Pulmonary: CTA Bilaterally Abdomen: Soft, Nontender, Nondistended Right Groin: Clean, Dry and Intact Vascular: Warm, Minimal Edema   Laboratory: CBC    Component Value Date/Time   WBC 8.4 09/22/2018 0433   HGB 10.9 (L) 09/22/2018 0433   HGB 14.2 03/07/2013 1849   HCT 36.0 (L) 09/22/2018 0433   HCT 41.5 03/07/2013 1849   PLT 287 09/22/2018 0433   PLT 212 03/07/2013 1849   BMET    Component Value Date/Time   NA 137 09/24/2018 0924   NA 137 03/07/2013 1849   K 4.0 09/24/2018 0924   K 3.6 03/07/2013 1849   CL 100 09/24/2018 0924   CL 101 03/07/2013 1849   CO2 28 09/24/2018 0924   CO2 26 03/07/2013 1849   GLUCOSE 188 (H) 09/24/2018 0924   GLUCOSE 135 (H) 03/07/2013 1849   BUN 14 11/25/2018 1030   BUN 18 03/07/2013 1849   CREATININE 1.05 11/25/2018 1030   CREATININE 1.06 03/07/2013 1849   CALCIUM 9.2 09/24/2018 0924   CALCIUM 9.4 03/07/2013 1849   GFRNONAA >60 11/25/2018 1030   GFRNONAA >60 03/07/2013 1849   GFRAA >60 11/25/2018 1030   GFRAA >60 03/07/2013 1849   Assessment/Planning: 78 year old male s/p right carotid stent -  stable 1) Doing well post-op 2) Advance diet as tolerated 3) Pain control 4) AM Labs 5) Dispo in AM  Discussed with [employer] PA-C 11/26/2018 2:49 PM

## 2018-11-27 LAB — CBC
HCT: 37.3 % — ABNORMAL LOW (ref 39.0–52.0)
Hemoglobin: 11.9 g/dL — ABNORMAL LOW (ref 13.0–17.0)
MCH: 29.8 pg (ref 26.0–34.0)
MCHC: 31.9 g/dL (ref 30.0–36.0)
MCV: 93.5 fL (ref 80.0–100.0)
Platelets: 327 10*3/uL (ref 150–400)
RBC: 3.99 MIL/uL — ABNORMAL LOW (ref 4.22–5.81)
RDW: 15.5 % (ref 11.5–15.5)
WBC: 10.6 10*3/uL — AB (ref 4.0–10.5)
nRBC: 0 % (ref 0.0–0.2)

## 2018-11-27 LAB — BASIC METABOLIC PANEL
Anion gap: 10 (ref 5–15)
BUN: 11 mg/dL (ref 8–23)
CO2: 27 mmol/L (ref 22–32)
Calcium: 8.8 mg/dL — ABNORMAL LOW (ref 8.9–10.3)
Chloride: 100 mmol/L (ref 98–111)
Creatinine, Ser: 0.99 mg/dL (ref 0.61–1.24)
GFR calc Af Amer: >60 mL/min (ref >60)
GFR calc non Af Amer: >60 mL/min (ref >60)
Glucose, Bld: 128 mg/dL — ABNORMAL HIGH (ref 70–99)
Potassium: 3.6 mmol/L (ref 3.5–5.1)
Sodium: 137 mmol/L (ref 135–145)

## 2018-11-27 MED ORDER — ACETAMINOPHEN 325 MG RE SUPP: 325.0000 mg | RECTAL | 0 refills | Status: DC | PRN

## 2018-11-27 MED ORDER — POTASSIUM CHLORIDE CRYS ER 20 MEQ PO TBCR
40.0000 meq | EXTENDED_RELEASE_TABLET | Freq: Once | ORAL | Status: AC
  Administered 2018-11-27: 40 meq via ORAL
  Filled 2018-11-27: qty 2

## 2018-11-27 MED ORDER — ACETAMINOPHEN 325 MG PO TABS: 325.0000 mg | ORAL_TABLET | ORAL | Status: DC | PRN

## 2018-11-27 MED ORDER — CLOPIDOGREL BISULFATE 75 MG PO TABS: 75.0000 mg | ORAL_TABLET | Freq: Every day | ORAL | 3 refills | Status: DC

## 2018-11-27 NOTE — [place] FL2 (Signed)
MEDICAID FL2 LEVEL OF CARE SCREENING TOOL     IDENTIFICATION  Patient Name: Darren Frazier Birthdate: 1940/12/25 Sex: male Admission Date (Current Location): 11/26/2018  [place] and [place] Number:  Chiropodist and Address:  [facility], [address] [place], [place] [address]      Provider Number: [id number]  Attending Physician Name and Address:  Renford Dills, MD  Relative Name and Phone Number:  Giano Lulay [phone]    Current Level of Care: Hospital Recommended Level of Care: Skilled Nursing Facility Prior Approval Number:    Date Approved/Denied:   PASRR Number: [id number]  Discharge Plan: SNF    Current Diagnoses: Patient Active Problem List   Diagnosis Date Noted  . Carotid stenosis, symptomatic, with infarction (HCC) 11/26/2018  . Carotid artery stenosis, symptomatic, right 10/22/2018  . Sinus pause 09/16/2018  . Acute respiratory failure (HCC) 09/15/2018  . Atrial fibrillation (HCC) 09/15/2018  . CHF (congestive heart failure) (HCC) 09/15/2018  . 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 Hyperlipemia 09/15/2018  . Morbid obesity (HCC) 09/15/2018  . OSA (obstructive sleep apnea) 09/15/2018  . 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 UTI (urinary tract infection) 09/15/2018  . Protein-calorie malnutrition (HCC) 09/15/2018  . Right hemiparesis (HCC) 08/27/2018  . 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 Essential hypertension 02/20/2018  . Diabetes (HCC) 02/20/2018  . 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 Chronic venous insufficiency 03/31/2017  . Leg pain 03/31/2017    Orientation RESPIRATION BLADDER Height & Weight     Self, Time, Place, Situation  Normal Continent Weight: 119.1 kg Height:  5\' 9"  (175.3 cm)  BEHAVIORAL SYMPTOMS/MOOD NEUROLOGICAL BOWEL NUTRITION STATUS      Continent Diet  AMBULATORY  STATUS COMMUNICATION OF NEEDS Skin   Extensive Assist Verbally Normal                       Personal Care Assistance Level of Assistance  Bathing, Feeding, Dressing Bathing Assistance: Limited assistance Feeding assistance: Limited assistance Dressing Assistance: Limited assistance     Functional Limitations Info             SPECIAL CARE FACTORS FREQUENCY                       Contractures Contractures Info: Not present    Additional Factors Info  Code Status, Allergies Code Status Info: Full Allergies Info: Ace inhibitors, omeprazole, statins           Current Medications (11/27/2018):  This is the current hospital active medication list Current Facility-Administered Medications  Medication Dose Route Frequency Provider Last Rate Last Dose  . 0.9 %  sodium chloride infusion  500 mL Intravenous Once PRN Schnier, [name], MD      . acetaminophen (TYLENOL) tablet 325-650 mg  325-650 mg Oral Q4H PRN Schnier, [name], MD   650 mg at 11/27/18 0159   Or  . acetaminophen (TYLENOL) suppository 325-650 mg  325-650 mg Rectal Q4H PRN Schnier, [name], MD      . albuterol (PROVENTIL) (2.5 MG/3ML) 0.083% nebulizer solution 2.5 mg  2.5 mg Nebulization Q6H PRN Schnier, [name], MD      . alum & mag hydroxide-simeth (MAALOX/MYLANTA) 200-200-20 MG/5ML suspension 15-30 mL  15-30 mL Oral Q2H PRN Schnier, [name], MD      . amiodarone (PACERONE) tablet 200 mg  200 mg Oral Daily Schnier, [name], MD   200 mg at 11/27/18 1006  . apixaban (ELIQUIS) tablet 5 mg  5 mg Oral BID Schnier, [name], MD   5 mg at 11/27/18 1007  . benzonatate (TESSALON) capsule 100 mg  100 mg Oral TID Renford Dills, MD   100 mg at 11/27/18 1004  . Chlorhexidine Gluconate Cloth 2 % PADS 6 each  6 each Topical Q0600 Erin Fulling, MD      . cholecalciferol (VITAMIN D) tablet 1,000 Units  1,000 Units Oral Daily Schnier, [name], MD   1,000 Units at 11/27/18 1004  . clopidogrel (PLAVIX)  tablet 75 mg  75 mg Oral Daily Schnier, [name], MD   75 mg at 11/27/18 1005  . dextrose 5 % and 0.9 % NaCl with KCl 20 mEq/L infusion   Intravenous Continuous Schnier, [name], MD 75 mL/hr at 11/27/18 0700    . docusate sodium (COLACE) capsule 100 mg  100 mg Oral Daily Schnier, [name], MD   100 mg at 11/27/18 1005  . ezetimibe (ZETIA) tablet 10 mg  10 mg Oral Daily Schnier, [name], MD   10 mg at 11/27/18 1003  . fluticasone (FLONASE) 50 MCG/ACT nasal spray 2 spray  2 spray Each Nare Daily PRN Schnier, [name], MD      . guaiFENesin-dextromethorphan (ROBITUSSIN DM) 100-10 MG/5ML syrup 15 mL  15 mL Oral Q4H PRN Schnier, [name], MD      . hydrALAZINE (APRESOLINE) injection 5 mg  5 mg Intravenous Q20 Min PRN Schnier, [name], MD      . labetalol (NORMODYNE,TRANDATE) injection 10 mg  10 mg Intravenous Q10 min PRN Schnier, [name], MD   10 mg at 11/27/18 [phone]  . levothyroxine (SYNTHROID, LEVOTHROID) tablet 100 mcg  100 mcg Oral QAC breakfast Schnier, [name], MD   100 mcg at 11/27/18 0609  . metoprolol tartrate (LOPRESSOR) injection 2-5 mg  2-5 mg Intravenous Q2H PRN Schnier, [name], MD   5 mg at 11/26/18 1755  . metoprolol tartrate (LOPRESSOR) tablet 50 mg  50 mg Oral BID Schnier, [name], MD   50 mg at 11/27/18 1006  . morphine 4 MG/ML injection 2-5 mg  2-5 mg Intravenous Q1H PRN Schnier, [name], MD      . multivitamin with minerals tablet 1 tablet  1 tablet Oral Daily Schnier, [name], MD   1 tablet at 11/27/18 1007  . mupirocin ointment (BACTROBAN) 2 % 1 application  1 application Nasal BID Erin Fulling, MD   1 application at 11/27/18 1004  . ondansetron (ZOFRAN) injection 4 mg  4 mg Intravenous Q6H PRN Schnier, [name], MD      . oxyCODONE (Oxy IR/ROXICODONE) immediate release tablet 5-10 mg  5-10 mg Oral Q4H PRN Schnier, [name], MD      . phenol (CHLORASEPTIC) mouth spray 1 spray  1 spray Mouth/Throat PRN Schnier, [name], MD      . potassium chloride SA  (K-DUR,KLOR-CON) CR tablet 40 mEq  40 mEq Oral Once Stegmayer, Kimberly A, PA-C      . QUEtiapine (SEROQUEL) tablet 75 mg  75 mg Oral QHS Schnier, [name], MD   75 mg at 11/26/18 2138  . senna-docusate (Senokot-S) tablet 1 tablet  1 tablet Oral QHS PRN Schnier, [name], MD      . vitamin C (ASCORBIC ACID) tablet 500 mg  500 mg Oral Daily Schnier, [name], MD  500 mg at 11/27/18 1010     Discharge Medications: Please see discharge summary for a list of discharge medications.  Relevant Imaging Results:  Relevant Lab Results:   Additional Information    Allayne Butcher, RN

## 2018-11-27 NOTE — Discharge Summary (Addendum)
[facility]    Discharge Summary  Patient ID:  Darren Frazier MRN: [id number] DOB/AGE: 05-16-1941 78 y.o.  Admit date: 11/26/2018 Discharge date: 11/28/2018 Date of Surgery: 11/26/2018 Surgeon: Surgeon(s): Schnier, [name], MD Annice Needy, MD  Admission Diagnosis: Carotid stenosis, symptomatic, with infarction [facility]) [I63.239]  Discharge Diagnoses:  Carotid stenosis, symptomatic, with infarction [facility]) [I63.239]  Secondary Diagnoses: Past Medical History:  Diagnosis Date  . ARF (acute respiratory failure) (HCC)   . Cardiomyopathy (HCC)   . Diabetes mellitus without complication (HCC)   . Dysphagia   . Dysrhythmia    atrial fibrillation  . Encephalopathy   . GERD (gastroesophageal reflux disease)   . H/O: urethral stricture    self caths occasionally  . Heart failure, unspecified (HCC)   . Hemiplegia affecting right dominant side (HCC)   . History of abdominal aortic aneurysm (AAA)    30/40mm in diameter, stable  . History of kidney stones 2017  . Hyperlipidemia   . Hypertension   . Hypothyroidism   . Morbid obesity with BMI of 40.0-44.9, adult (HCC) 2019  . Peripheral vascular disease (HCC) 2019   lymphadema both extremities, ulceration on left big toe  . Sleep apnea   . Stroke (HCC) 2011   x 2 within 6 months. some numbness over upper arms, balance off  . Tremors of nervous system   . Vitamin D deficiency    Procedure(s): CAROTID PTA/STENT INTERVENTION  Discharged Condition: good  HPI:  The patient is a 78 year old male who presented with greater than 90% right carotid artery stenosis associated with recent occlusion of the left internal carotid artery. On 11/26/18, the patient underwent: 1.  Ultrasound guidance for vascular access right femoral artery 2.  Placement of a 10 x 8 x 40 exact stent with the use of the NAV-6 embolic protection device in the right internal carotid artery  The patient tolerated the procedure well.   The patient's night of surgery was unremarkable.  On postop day #1, the patient's diet was advanced, he was urinating, pain was controlled with the use of p.o. pain medication and he was at his baseline ambulation status.  The patient's hospital course was without complication.  Upon discharge, the patient's vital signs were stable and his physical exam was as expected/unremarkable.  Hospital Course:  Darren Frazier is a 78 y.o. male is S/P:  Procedure(s): CAROTID PTA/STENT INTERVENTION  Extubated: POD # 0  Physical exam: A&Ox3, NAD Face: Symmetrical, Tongue midline Neck: No swelling noted CV: RRR Pulmonary: CTA Bilaterally Abdomen: Soft, Non-tender, Non-distended, (+) Bowel Sounds Right Groin: Access site - clean, dry and intact. No ecchymosis noted/ Extremity: Warm, Non-tender, Minimal Edema Neuro: No worsening in his known baseline neurological status  Post-op wounds clean, dry, intact or healing well  Pt. Ambulating, voiding and taking PO diet without difficulty.  Pt pain controlled with PO pain meds.  Labs as below  Complications: None  Consults: None  Significant Diagnostic Studies: CBC Lab Results  Component Value Date   WBC 10.6 (H) 11/27/2018   HGB 11.9 (L) 11/27/2018   HCT 37.3 (L) 11/27/2018   MCV 93.5 11/27/2018   PLT 327 11/27/2018   BMET    Component Value Date/Time   NA 137 11/27/2018 0427   NA 137 03/07/2013 1849   K 3.6 11/27/2018 0427   K 3.6 03/07/2013 1849   CL 100 11/27/2018 0427   CL 101 03/07/2013 1849   CO2 27 11/27/2018 0427  CO2 26 03/07/2013 1849   GLUCOSE 128 (H) 11/27/2018 0427   GLUCOSE 135 (H) 03/07/2013 1849   BUN 11 11/27/2018 0427   BUN 18 03/07/2013 1849   CREATININE 0.99 11/27/2018 0427   CREATININE 1.06 03/07/2013 1849   CALCIUM 8.8 (L) 11/27/2018 0427   CALCIUM 9.4 03/07/2013 1849   GFRNONAA >60 11/27/2018 0427   GFRNONAA >60 03/07/2013 1849   GFRAA >60 11/27/2018 0427   GFRAA >60 03/07/2013 1849    COAG Lab Results  Component Value Date   INR 1.73 09/18/2018   INR 1.50 08/27/2018   INR 1.1 03/07/2013   Disposition:  Discharge to [facility] on 11/28/18  Allergies as of 11/28/2018      Reactions   Ace Inhibitors Other (See Comments)   unknown   Omeprazole Diarrhea   Statins Other (See Comments)   Abnormal lab results(pt unsure of what labs)      Medication List    TAKE these medications   acetaminophen 325 MG tablet Commonly known as:  TYLENOL Take 1-2 tablets (325-650 mg total) by mouth every 4 (four) hours as needed for mild pain (or temp >/= 101 F).   acetaminophen 325 MG suppository Commonly known as:  TYLENOL Place 1-2 suppositories (325-650 mg total) rectally every 4 (four) hours as needed for mild pain (or temp >/= 101 F).   albuterol (2.5 MG/3ML) 0.083% nebulizer solution Commonly known as:  PROVENTIL Take 3 mLs (2.5 mg total) by nebulization every 6 (six) hours as needed for wheezing or shortness of breath.   amiodarone 200 MG tablet Commonly known as:  PACERONE Take 1 tablet (200 mg total) by mouth daily.   apixaban 5 MG Tabs tablet Commonly known as:  ELIQUIS Take 1 tablet (5 mg total) by mouth 2 (two) times daily.   benzonatate 100 MG capsule Commonly known as:  TESSALON Take 100 mg by mouth 3 (three) times daily.   clopidogrel 75 MG tablet Commonly known as:  PLAVIX Take 1 tablet (75 mg total) by mouth daily.   ezetimibe 10 MG tablet Commonly known as:  ZETIA Take 1 tablet (10 mg total) by mouth daily.   Fish Oil 1000 MG Caps Take 1,000 mg by mouth 3 (three) times daily.   fluticasone 50 MCG/ACT nasal spray Commonly known as:  FLONASE Place 2 sprays into both nostrils daily as needed for allergies or rhinitis.   levothyroxine 100 MCG tablet Commonly known as:  SYNTHROID, LEVOTHROID Take 1 tablet (100 mcg total) by mouth daily before breakfast.   metoprolol tartrate 50 MG tablet Commonly known as:  LOPRESSOR Take 50 mg by mouth  2 (two) times daily.   multivitamin with minerals Tabs tablet Take 1 tablet by mouth daily.   QUEtiapine 25 MG tablet Commonly known as:  SEROQUEL Take 25 mg by mouth See admin instructions. Take with 50 mg to equal 75 mg at bedtime What changed:  Another medication with the same name was changed. Make sure you understand how and when to take each.   QUEtiapine 50 MG tablet Commonly known as:  SEROQUEL Take 1.5 tablets (75 mg total) by mouth at bedtime. What changed:    how much to take  when to take this  additional instructions   senna-docusate 8.6-50 MG tablet Commonly known as:  Senokot-S Take 1 tablet by mouth at bedtime as needed for mild constipation.   vitamin C 500 MG tablet Commonly known as:  ASCORBIC ACID Take 500 mg by mouth daily.  Vitamin D3 25 MCG (1000 UT) Caps Take 1,000 Units by mouth daily.      Verbal and written Discharge instructions given to the patient. Wound care per Discharge AVS Follow-up Information    Schnier, [name], MD Follow up in 3 month(s).   Specialties:  Vascular Surgery, Cardiology, Radiology, Vascular Surgery Why:  Please schedule after 02/28/19. Can see Schnier or midlevel. Will need Carotid Duplex with visit. First Carotid Stent Placement follow up.  Contact information: [address] [place] [address] [phone]          Signed: Tonette Lederer, PA-C  11/28/2018, 9:30 AM

## 2018-11-27 NOTE — TOC Initial Note (Signed)
Transition of Care [facility]) - Initial/Assessment Note    Patient Details  Name: Darren Frazier MRN: [id number] Date of Birth: 03-03-1941  [facility] CM/SW Contact:    Allayne Butcher, RN Phone Number: 11/27/2018, 10:48 AM  Clinical Narrative:                 Patient admitted for Carotid Stenosis requiring right internal carotid artery stent placement.  Patient is doing well and is cleared for discharge.  Patient is from [facility].  Stanton Kidney from [facility] is aware of patient discharge today.  Patient will discharge via [facility].   Expected Discharge Plan: Skilled Nursing Facility Barriers to Discharge: Insurance Authorization([employer] aproval )   Patient Goals and CMS Choice        Expected Discharge Plan and Services Expected Discharge Plan: Skilled Nursing Facility   Discharge Planning Services: CM Consult   Living arrangements for the past 2 months: Skilled Nursing Facility Expected Discharge Date: 11/27/18                        Prior Living Arrangements/Services Living arrangements for the past 2 months: Skilled Nursing Facility Lives with:: Facility Resident Patient language and need for interpreter reviewed:: Yes Do you feel safe going back to the place where you live?: Yes      Need for Family Participation in Patient Care: Yes (Comment) Care giver support system in place?: Yes (comment)(wife)   Criminal Activity/Legal Involvement Pertinent to Current Situation/Hospitalization: No - Comment as needed  Activities of Daily Living Home Assistive Devices/Equipment: Wheelchair ADL Screening (condition at time of admission) Patient's cognitive ability adequate to safely complete daily activities?: Yes Is the patient deaf or have difficulty hearing?: No Does the patient have difficulty seeing, even when wearing glasses/contacts?: No Does the patient have difficulty concentrating, remembering, or making decisions?: Yes Patient able to  express need for assistance with ADLs?: Yes Does the patient have difficulty dressing or bathing?: No Independently performs ADLs?: No Communication: Independent Dressing (OT): Needs assistance Is this a change from baseline?: Pre-admission baseline Grooming: Needs assistance Is this a change from baseline?: Pre-admission baseline Feeding: Independent Bathing: Needs assistance Is this a change from baseline?: Pre-admission baseline In/Out Bed: Needs assistance Is this a change from baseline?: Pre-admission baseline Walks in Home: Needs assistance Is this a change from baseline?: Pre-admission baseline Does the patient have difficulty walking or climbing stairs?: Yes Weakness of Legs: Right Weakness of Arms/Hands: Right  Permission Sought/Granted Permission sought to share information with : Family Supports Permission granted to share information with : Yes, Verbal Permission Granted     Permission granted to share info w AGENCY: [facility]        Emotional Assessment Appearance:: Appears stated age Attitude/Demeanor/Rapport: Engaged Affect (typically observed): Accepting Orientation: : Oriented to Self, Oriented to Place, Oriented to  Time, Oriented to Situation Alcohol / Substance Use: Not Applicable Psych Involvement: No (comment)  Admission diagnosis:  Carotid stenosis, symptomatic, with infarction [facility]) [I63.239] Patient Active Problem List   Diagnosis Date Noted  . Carotid stenosis, symptomatic, with infarction (HCC) 11/26/2018  . Carotid artery stenosis, symptomatic, right 10/22/2018  . Sinus pause 09/16/2018  . Acute respiratory failure (HCC) 09/15/2018  . Atrial fibrillation (HCC) 09/15/2018  . CHF (congestive heart failure) (HCC) 09/15/2018  . 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 Hyperlipemia 09/15/2018  . Morbid obesity (HCC) 09/15/2018  . OSA (obstructive sleep apnea) 09/15/2018  .  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 UTI  (urinary tract infection) 09/15/2018  . Protein-calorie malnutrition (HCC) 09/15/2018  . Right hemiparesis (HCC) 08/27/2018  . 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 Essential hypertension 02/20/2018  . Diabetes (HCC) 02/20/2018  . 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 Chronic venous insufficiency 03/31/2017  . Leg pain 03/31/2017   PCP:  Lynnea Ferrier, MD Pharmacy:   [address] [place], [place] - [address] [place] [address] Phone: [phone] Fax: [phone]  [facility] - [place], [place] - [address] [place] [address] Phone: [phone] Fax: [phone]     Social Determinants of Health (SDOH) Interventions    Readmission Risk Interventions No flowsheet data found.

## 2018-11-27 NOTE — Plan of Care (Signed)
  Problem: Education: Goal: Knowledge of General Education information will improve Description Including pain rating scale, medication(s)/side effects and non-pharmacologic comfort measures Outcome: Progressing   Problem: Health Behavior/Discharge Planning: Goal: Ability to manage health-related needs will improve Outcome: Progressing   Problem: Clinical Measurements: Goal: Ability to maintain clinical measurements within normal limits will improve Outcome: Progressing Goal: Diagnostic test results will improve Outcome: Progressing Goal: Respiratory complications will improve Outcome: Progressing Goal: Cardiovascular complication will be avoided Outcome: Progressing   Problem: Nutrition: Goal: Adequate nutrition will be maintained Outcome: Progressing   Problem: Coping: Goal: Level of anxiety will decrease Outcome: Progressing   Problem: Elimination: Goal: Will not experience complications related to bowel motility Outcome: Progressing Goal: Will not experience complications related to urinary retention Outcome: Progressing   Problem: Pain Managment: Goal: General experience of comfort will improve Outcome: Progressing   Problem: Safety: Goal: Ability to remain free from injury will improve Outcome: Progressing   Problem: Skin Integrity: Goal: Risk for impaired skin integrity will decrease Outcome: Progressing   Problem: Activity: Goal: Risk for activity intolerance will decrease Outcome: Not Met (add Reason)

## 2018-11-27 NOTE — Progress Notes (Signed)
Patient was set to dc today, [employer] denied so they could reapply.

## 2018-11-27 NOTE — TOC Progression Note (Signed)
Transition of Care [facility]) - Progression Note    Patient Details  Name: Darren Frazier MRN: [id number] Date of Birth: Oct 11, 1940  [facility] CM/SW Contact  Allayne Butcher, RN Phone Number: 11/27/2018, 11:10 AM  Clinical Narrative:    Stanton Kidney from [facility] called to say that [employer] needs to have new authorization for return to SNF.  Debra submitted case for auth today.    Expected Discharge Plan: Skilled Nursing Facility Barriers to Discharge: Insurance Authorization([employer] aproval )  Expected Discharge Plan and Services Expected Discharge Plan: Skilled Nursing Facility   Discharge Planning Services: CM Consult   Living arrangements for the past 2 months: Skilled Nursing Facility Expected Discharge Date: 11/27/18                         Social Determinants of Health (SDOH) Interventions    Readmission Risk Interventions No flowsheet data found.

## 2018-11-27 NOTE — Progress Notes (Signed)
Right groin still oozing, ABDs placed and changed at 1800.

## 2018-11-27 NOTE — [place] FL2 (Signed)
[place] MEDICAID FL2 LEVEL OF CARE SCREENING TOOL     IDENTIFICATION  Patient Name: Darren Frazier Birthdate: [birth year] Sex: male Admission Date (Current Location): 11/26/2018  [place] and [place] Number:  Chiropodist and Address:  [facility], [address], [place], [place] [address]      Provider Number: [id number]  Attending Physician Name and Address:  Renford Dills, MD  Relative Name and Phone Number:  Dirck Kevorkian [phone]    Current Level of Care: Hospital Recommended Level of Care: Skilled Nursing Facility Prior Approval Number:    Date Approved/Denied:   PASRR Number: [id number]  Discharge Plan: SNF    Current Diagnoses: Patient Active Problem List   Diagnosis Date Noted  . Carotid stenosis, symptomatic, with infarction (HCC) 11/26/2018  . Carotid artery stenosis, symptomatic, right 10/22/2018  . Sinus pause 09/16/2018  . Acute respiratory failure (HCC) 09/15/2018  . Atrial fibrillation (HCC) 09/15/2018  . CHF (congestive heart failure) (HCC) 09/15/2018  . 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 Hyperlipemia 09/15/2018  . Morbid obesity (HCC) 09/15/2018  . OSA (obstructive sleep apnea) 09/15/2018  . 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 UTI (urinary tract infection) 09/15/2018  . Protein-calorie malnutrition (HCC) 09/15/2018  . Right hemiparesis (HCC) 08/27/2018  . 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 Essential hypertension 02/20/2018  . Diabetes (HCC) 02/20/2018  . 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 Chronic venous insufficiency 03/31/2017  . Leg pain 03/31/2017    Orientation RESPIRATION BLADDER Height & Weight     Self, Time, Place, Situation  Normal Continent Weight: 119.1 kg Height:  5\' 9"  (175.3 cm)  BEHAVIORAL SYMPTOMS/MOOD NEUROLOGICAL BOWEL NUTRITION STATUS      Continent Diet  AMBULATORY  STATUS COMMUNICATION OF NEEDS Skin   Extensive Assist Verbally Normal                       Personal Care Assistance Level of Assistance  Bathing, Feeding, Dressing Bathing Assistance: Limited assistance Feeding assistance: Limited assistance Dressing Assistance: Limited assistance     Functional Limitations Info             SPECIAL CARE FACTORS FREQUENCY                       Contractures Contractures Info: Not present    Additional Factors Info  Code Status, Allergies Code Status Info: Full Allergies Info: Ace inhibitors, omeprazole, statins           Current Medications (11/27/2018):  This is the current hospital active medication list Current Facility-Administered Medications  Medication Dose Route Frequency Provider Last Rate Last Dose  . 0.9 %  sodium chloride infusion  500 mL Intravenous Once PRN Schnier, [name], MD      . acetaminophen (TYLENOL) tablet 325-650 mg  325-650 mg Oral Q4H PRN Schnier, [name], MD   650 mg at 11/27/18 0159   Or  . acetaminophen (TYLENOL) suppository 325-650 mg  325-650 mg Rectal Q4H PRN Schnier, [name], MD      . albuterol (PROVENTIL) (2.5 MG/3ML) 0.083% nebulizer solution 2.5 mg  2.5 mg Nebulization Q6H PRN Schnier, [name], MD      . alum & mag hydroxide-simeth (MAALOX/MYLANTA) 200-200-20 MG/5ML suspension 15-30 mL  15-30 mL Oral Q2H PRN Schnier, [name], MD      . amiodarone (PACERONE) tablet 200 mg  200 mg Oral Daily Schnier, [name], MD   200 mg at 11/26/18 1433  . apixaban (ELIQUIS) tablet 5 mg  5 mg Oral BID Schnier, [name], MD      . benzonatate (TESSALON) capsule 100 mg  100 mg Oral TID Renford Dills, MD   100 mg at 11/26/18 2139  . Chlorhexidine Gluconate Cloth 2 % PADS 6 each  6 each Topical Q0600 Erin Fulling, MD      . cholecalciferol (VITAMIN D) tablet 1,000 Units  1,000 Units Oral Daily Schnier, [name], MD   1,000 Units at 11/26/18 1432  . clopidogrel (PLAVIX) tablet 75 mg  75 mg Oral  Daily Schnier, [name], MD      . dextrose 5 % and 0.9 % NaCl with KCl 20 mEq/L infusion   Intravenous Continuous Schnier, [name], MD 75 mL/hr at 11/27/18 0700    . docusate sodium (COLACE) capsule 100 mg  100 mg Oral Daily Schnier, [name], MD      . ezetimibe (ZETIA) tablet 10 mg  10 mg Oral Daily Schnier, [name], MD   10 mg at 11/26/18 1646  . fluticasone (FLONASE) 50 MCG/ACT nasal spray 2 spray  2 spray Each Nare Daily PRN Schnier, [name], MD      . guaiFENesin-dextromethorphan (ROBITUSSIN DM) 100-10 MG/5ML syrup 15 mL  15 mL Oral Q4H PRN Schnier, [name], MD      . hydrALAZINE (APRESOLINE) injection 5 mg  5 mg Intravenous Q20 Min PRN Schnier, [name], MD      . labetalol (NORMODYNE,TRANDATE) injection 10 mg  10 mg Intravenous Q10 min PRN Schnier, [name], MD   10 mg at 11/27/18 [phone]  . levothyroxine (SYNTHROID, LEVOTHROID) tablet 100 mcg  100 mcg Oral QAC breakfast Schnier, [name], MD   100 mcg at 11/27/18 0609  . metoprolol tartrate (LOPRESSOR) injection 2-5 mg  2-5 mg Intravenous Q2H PRN Schnier, [name], MD   5 mg at 11/26/18 1755  . metoprolol tartrate (LOPRESSOR) tablet 50 mg  50 mg Oral BID [name] [name], MD   50 mg at 11/26/18 2139  . morphine 4 MG/ML injection 2-5 mg  2-5 mg Intravenous Q1H PRN Schnier, [name], MD      . multivitamin with minerals tablet 1 tablet  1 tablet Oral Daily Schnier, [name], MD   1 tablet at 11/26/18 1433  . mupirocin ointment (BACTROBAN) 2 % 1 application  1 application Nasal BID Erin Fulling, MD   1 application at 11/26/18 2142  . ondansetron (ZOFRAN) injection 4 mg  4 mg Intravenous Q6H PRN Schnier, [name], MD      . oxyCODONE (Oxy IR/ROXICODONE) immediate release tablet 5-10 mg  5-10 mg Oral Q4H PRN Schnier, [name], MD      . phenol (CHLORASEPTIC) mouth spray 1 spray  1 spray Mouth/Throat PRN Schnier, [name], MD      . QUEtiapine (SEROQUEL) tablet 75 mg  75 mg Oral QHS Schnier, [name], MD   75 mg at 11/26/18  2138  . senna-docusate (Senokot-S) tablet 1 tablet  1 tablet Oral QHS PRN Schnier, [name], MD      . vitamin C (ASCORBIC ACID) tablet 500 mg  500 mg Oral Daily Schnier, [name], MD   500 mg at 11/26/18 1646     Discharge Medications: Please see discharge summary for a list of discharge medications.  Relevant Imaging Results:  Relevant Lab Results:  Additional Information    Allayne Butcher, RN

## 2018-11-28 DIAGNOSIS — I6521 Occlusion and stenosis of right carotid artery: Secondary | ICD-10-CM

## 2018-11-28 DIAGNOSIS — I63232 Cerebral infarction due to unspecified occlusion or stenosis of left carotid arteries: Secondary | ICD-10-CM

## 2018-11-28 NOTE — Progress Notes (Signed)
Wife/legal guardian, notified per Alexia Freestone. See notes.

## 2018-11-28 NOTE — TOC Transition Note (Signed)
Transition of Care [facility]) - CM/SW Discharge Note   Patient Details  Name: Darren Frazier MRN: [id number] Date of Birth: [birth year]  Transition of Care [facility]) CM/SW Contact:  Allayne Butcher, RN Phone Number: 11/28/2018, 9:53 AM   Clinical Narrative:    Darren Frazier admission coordinator with [facility] notified RNCM this morning that Monia Pouch has waived the insurance authorization so patient can return to facility.  Bedside RN notified patient ready for transfer, she will call report.  [facility] Zenaida Niece will pick patient up and transport back to facility.  Wife Darren Frazier notified that patient is returning to facility today.    Final next level of care: Skilled Nursing Facility([facility]) Barriers to Discharge: Barriers Resolved(Auth waived)   Patient Goals and CMS Choice        Discharge Placement                  Name of family member notified: Darren Frazier  Patient and family notified of of transfer: 11/28/18  Discharge Plan and Services   Discharge Planning Services: CM Consult                      Social Determinants of Health (SDOH) Interventions     Readmission Risk Interventions No flowsheet data found.

## 2018-11-28 NOTE — Progress Notes (Signed)
Late note 1015- Patient transferred from bed to own wheelchair with 3 assistants. Wheelchair adjusted per patient request. All belongings sent with patient including D/C packet with Plavix prescription and patients cell phone.

## 2018-11-28 NOTE — Discharge Instructions (Signed)
° °  Carotid Artery Disease  The carotid arteries are arteries on both sides of the neck. They carry blood to the brain, face, and neck. Carotid artery disease happens when these arteries become smaller (narrow) or get blocked. If these arteries become smaller or get blocked, you are more likely to have a stroke or a warning stroke (transient ischemic attack). Follow these instructions at home:  Take over-the-counter and prescription medicines only as told by your doctor.  Make sure you understand all instructions about your medicines. Do not stop taking your medicines without talking to your doctor first.  Follow your doctor's diet instructions. It is important to follow a healthy diet. ? Eat foods that include plenty of: ? Fresh fruits. ? Vegetables. ? Lean meats. ? Avoid these foods: ? Foods that are high in fat. ? Foods that are high in salt (sodium). ? Foods that are fried. ? Foods that are processed. ? Foods that have few good nutrients (poor nutritional value).  Keep a healthy weight.  Stay active. Get at least 30 minutes of activity every day.  Do not smoke.  Limit alcohol use to: ? No more than 2 drinks a day for men. ? No more than 1 drink a day for women who are not pregnant.  Do not use illegal drugs.  Keep all follow-up visits as told by your doctor. This is important. Contact a doctor if: Get help right away if:  You have any symptoms of stroke or TIA. The acronym BEFAST is an easy way to remember the main warning signs of stroke. ? B = Balance problems. Signs include dizziness, sudden trouble walking, or loss of balance ? E = Eye problems. This includes trouble seeing or a sudden change in vision. ? F = Face changes. This includes sudden weakness or numbness of the face, or the face or eyelid drooping to one side. ? A = Arm weakness or numbness. This happens suddenly and usually on one side of the body. ? S = Speech problems. This includes trouble speaking or  trouble understanding. ? T = Time. Time to call 911 or seek emergency care. Do not wait to see if symptoms go away. Make note of the time your symptoms started.  Other signs of stroke may include: ? A sudden, severe headache with no known cause. ? Feeling sick to your stomach (nauseous) or throwing up (vomiting). ? Seizure. Call your local emergency services (911 in [place] Do notdrive yourself to the clinic or hospital. Summary  The carotid arteries are arteries on both sides of the neck.  If these arteries get smaller or get blocked, you are more likely to have a stroke or a warning stroke (transient ischemic attack).  Take over-the-counter and prescription medicines only as told by your doctor.  Keep all follow-up visits as told by your doctor. This is important. This information is not intended to replace advice given to you by your health care provider. Make sure you discuss any questions you have with your health care provider. Document Released: 08/06/2012 Document Revised: 08/15/2017 Document Reviewed: 08/15/2017 Elsevier Interactive Patient Education  2019 [employer]. You may shower as of Saturday. Please keep groins clean and dry.

## 2018-11-28 NOTE — Progress Notes (Signed)
Patient is anxious when answering questions. States he is worried that if he answers incorrectly, he will have to stay.

## 2018-11-28 NOTE — Progress Notes (Addendum)
DC to [facility], report given to Jerrol Banana, RN @ 1030 am. All belongings verified and given back to patient. Pt transported to [employer] at [employer] by Majel Homer, RN. Responsibility of care transferred to [facility] driver at 0211 am.

## 2018-12-15 ENCOUNTER — Telehealth (INDEPENDENT_AMBULATORY_CARE_PROVIDER_SITE_OTHER): Payer: Self-pay | Admitting: Vascular Surgery

## 2018-12-15 NOTE — Telephone Encounter (Signed)
Spoke to patient's wife.  Patient's wife states that Darren Frazier recently returned home from [facility].  His wife states that his access sites are healing well.  His postoperative course has been unremarkable.  The patient does have an appointment scheduled for March 05, 2019 with a carotid ultrasound.  The wife was instructed to please call her office if anything changes.

## 2019-03-04 ENCOUNTER — Other Ambulatory Visit (INDEPENDENT_AMBULATORY_CARE_PROVIDER_SITE_OTHER): Payer: Self-pay | Admitting: Vascular Surgery

## 2019-03-04 DIAGNOSIS — Z95828 Presence of other vascular implants and grafts: Secondary | ICD-10-CM

## 2019-03-04 NOTE — Progress Notes (Signed)
MRN : [id number]  Darren Frazier is a 78 y.o. (Aug 11, 1941) male who presents with chief complaint of No chief complaint on file. [name]  History of Present Illness:   The patient is seen for follow up evaluation of carotid stenosis status post right carotid endarterectomy on 11/26/2018 with a 10 x 8 x 40 exact stent .  There were no post operative problems or complications related to the surgery.  The patient denies neck or incisional pain.  The patient denies interval amaurosis fugax. There is no recent history of TIA symptoms or focal motor deficits. There is no prior documented CVA.  The patient denies headache.  The patient is taking enteric-coated aspirin 81 mg daily.  The patient has a history of coronary artery disease, no recent episodes of angina or shortness of breath. The patient denies PAD or claudication symptoms. There is a history of hyperlipidemia which is being treated with a statin.    No outpatient medications have been marked as taking for the 03/05/19 encounter (Appointment) with [name], [name], MD.    Past Medical History:  Diagnosis Date  . ARF (acute respiratory failure) (HCC)   . Cardiomyopathy (HCC)   . Diabetes mellitus without complication (HCC)   . Dysphagia   . Dysrhythmia    atrial fibrillation  . Encephalopathy   . GERD (gastroesophageal reflux disease)   . H/O: urethral stricture    self caths occasionally  . Heart failure, unspecified (HCC)   . Hemiplegia affecting right dominant side (HCC)   . History of abdominal aortic aneurysm (AAA)    30/39mm in diameter, stable  . History of kidney stones 2017  . Hyperlipidemia   . Hypertension   . Hypothyroidism   . Morbid obesity with BMI of 40.0-44.9, adult (HCC) 2019  . Peripheral vascular disease (HCC) 2019   lymphadema both extremities, ulceration on left big toe  . Sleep apnea   . Stroke (HCC) 2011   x 2 within 6 months. some numbness over upper arms, balance off  . Tremors of  nervous system   . Vitamin D deficiency     Past Surgical History:  Procedure Laterality Date  . APPENDECTOMY  1961  . CAROTID PTA/STENT INTERVENTION N/A 11/26/2018   Procedure: CAROTID PTA/STENT INTERVENTION;  Surgeon: Renford Dills, MD;  Location: [facility] INVASIVE CV LAB;  Service: Cardiovascular;  Laterality: N/A;  . CATARACT EXTRACTION, BILATERAL Bilateral   . CENTRAL LINE  08/29/2018      . COLONOSCOPY    . IR ANGIO INTRA EXTRACRAN SEL COM CAROTID INNOMINATE UNI R MOD SED  08/27/2018  . IR ANGIO VERTEBRAL SEL SUBCLAVIAN INNOMINATE UNI L MOD SED  08/27/2018  . IR CT HEAD LTD  08/27/2018  . IR PERCUTANEOUS ART THROMBECTOMY/INFUSION INTRACRANIAL INC DIAG ANGIO  08/27/2018  . LOWER EXTREMITY ANGIOGRAPHY Left 03/05/2018   Procedure: LOWER EXTREMITY ANGIOGRAPHY;  Surgeon: Renford Dills, MD;  Location: [facility] INVASIVE CV LAB;  Service: Cardiovascular;  Laterality: Left;  . RADIOLOGY WITH ANESTHESIA N/A 08/27/2018   Procedure: IR WITH ANESTHESIA;  Surgeon: Radiologist, Medication, MD;  Location: [facility] OR;  Service: Radiology;  Laterality: N/A;  . TONSILLECTOMY      Social History Social History   Tobacco Use  . Smoking status: Former Smoker    Packs/day: 4.00    Years: 30.00    Pack years: 120.00    Types: Cigarettes    Quit date: 1984    Years since quitting: 36.5  . Smokeless tobacco:  Never Used  Substance Use Topics  . Alcohol use: No    Comment: occasional beer. couple years ago  . Drug use: No    Family History Family History  Problem Relation Age of Onset  . Diabetes Father     Allergies  Allergen Reactions  . Ace Inhibitors Other (See Comments)    unknown  . Omeprazole Diarrhea  . Statins Other (See Comments)    Abnormal lab results(pt unsure of what labs)     REVIEW OF SYSTEMS (Negative unless checked)  Constitutional: [] Weight loss  [] Fever  [] Chills Cardiac: [] Chest pain   [] Chest pressure   [] Palpitations   [] Shortness of breath when laying flat    [] Shortness of breath with exertion. Vascular:  [] Pain in legs with walking   [] Pain in legs at rest  [] History of DVT   [] Phlebitis   [x] Swelling in legs   [] Varicose veins   [] Non-healing ulcers Pulmonary:   [] Uses home oxygen   [] Productive cough   [] Hemoptysis   [] Wheeze  [] COPD   [] Asthma Neurologic:  [] Dizziness   [] Seizures   [x] History of stroke   [] History of TIA  [x] Aphasia   [] Vissual changes   [] Weakness or numbness in arm   [x] Weakness or numbness in leg Musculoskeletal:   [] Joint swelling   [] Joint pain   [] Low back pain Hematologic:  [] Easy bruising  [] Easy bleeding   [] Hypercoagulable state   [] Anemic Gastrointestinal:  [] Diarrhea   [] Vomiting  [] Gastroesophageal reflux/heartburn   [] Difficulty swallowing. Genitourinary:  [] Chronic kidney disease   [] Difficult urination  [] Frequent urination   [] Blood in urine Skin:  [x] Rashes   [] Ulcers  Psychological:  [] History of anxiety   []  History of major depression.  Physical Examination  There were no vitals filed for this visit. There is no height or weight on file to calculate BMI. Gen: WD/WN, NAD seen in a wheelchair Head: [place]/AT, No temporalis wasting.  Ear/Nose/Throat: Hearing grossly intact, nares w/o erythema or drainage Eyes: PER, EOMI, sclera nonicteric.  Neck: Supple, no large masses.   Pulmonary:  Good air movement, no audible wheezing bilaterally, no use of accessory muscles.  Cardiac: RRR, no JVD Vascular: scattered varicosities present bilaterally.  Severe venous stasis changes to the legs bilaterally.  2+ soft pitting edema Vessel Right Left  Radial Palpable Palpable  PT Trace Palpable Trace Palpable  DP Trace Palpable Trace Palpable  Gastrointestinal: Non-distended. No guarding/no peritoneal signs.  Musculoskeletal: M/S 5/5 throughout left side 1/5 right side.  No deformity or atrophy.  Neurologic: CN 2-12 intact. Symmetrical.  Speech is fluent. Motor exam as listed above. Psychiatric: Judgment intact, Mood &  affect appropriate for pt's clinical situation. Dermatologic: Severe venous rashes no ulcers noted.  No changes consistent with cellulitis. Lymph : No lichenification or skin changes of chronic lymphedema.  CBC Lab Results  Component Value Date   WBC 10.6 (H) 11/27/2018   HGB 11.9 (L) 11/27/2018   HCT 37.3 (L) 11/27/2018   MCV 93.5 11/27/2018   PLT 327 11/27/2018    BMET    Component Value Date/Time   NA 137 11/27/2018 0427   NA 137 03/07/2013 1849   K 3.6 11/27/2018 0427   K 3.6 03/07/2013 1849   CL 100 11/27/2018 0427   CL 101 03/07/2013 1849   CO2 27 11/27/2018 0427   CO2 26 03/07/2013 1849   GLUCOSE 128 (H) 11/27/2018 0427   GLUCOSE 135 (H) 03/07/2013 1849   BUN 11 11/27/2018 0427   BUN 18 03/07/2013 1849  CREATININE 0.99 11/27/2018 0427   CREATININE 1.06 03/07/2013 1849   CALCIUM 8.8 (L) 11/27/2018 0427   CALCIUM 9.4 03/07/2013 1849   GFRNONAA >60 11/27/2018 0427   GFRNONAA >60 03/07/2013 1849   GFRAA >60 11/27/2018 0427   GFRAA >60 03/07/2013 1849   CrCl cannot be calculated (Patient's most recent lab result is older than the maximum 21 days allowed.).  COAG Lab Results  Component Value Date   INR 1.73 09/18/2018   INR 1.50 08/27/2018   INR 1.1 03/07/2013    Radiology No results found.  Assessment/Plan 1. Carotid stenosis, symptomatic, with infarction [facility]) Recommend:  The patient is s/p successful right carotid stent  Duplex ultrasound preoperatively shows <30% RICA (s/p stent) and occlusion of the LICA (known).  Continue antiplatelet therapy as prescribed.  The patient had raise concerns about Plavix with the Eliquis stating he was also taking a baby aspirin as well.  I spoke with his wife by telephone today and he is not taking a baby aspirin.  He does not have any significant bleeding issues.  We have therefore agreed that we will continue Plavix with Eliquis given his carotid stent in association with his atrial fibrillation.  Continue  management of CAD, HTN and Hyperlipidemia Healthy heart diet,  encouraged exercise at least 4 times per week  Follow up in 6 months with duplex ultrasound and physical exam based on the patient's carotid stenting  - VAS US CAROTID; Future  2. Chronic venous insufficiency No surgery or intervention at this point in time.    I have reviewed my discussion with the patient regarding venous insufficiency and secondary lymph edema and why it  causes symptoms. I have discussed with the patient the chronic skin changes that accompany these problems and the long term sequela such as ulceration and infection.  Patient will continue wearing graduated compression stockings class 1 (20-30 mmHg) on a daily basis a prescription was given to the patient to keep this updated. The patient will  put the stockings on first thing in the morning and removing them in the evening. The patient is instructed specifically not to sleep in the stockings.  In addition, behavioral modification including elevation during the day will be continued.  Diet and salt restriction was also discussed.  Previous duplex ultrasound of the lower extremities shows normal deep venous system, superficial reflux was not present.   Following the review of the ultrasound the patient will follow up in 12 months to reassess the degree of swelling and the control that graduated compression is offering.   The patient can be assessed for a Lymph Pump at that time.  However, at this time the patient states they are satisfied with the control compression and elevation is yielding.   3. Essential hypertension Continue antihypertensive medications as already ordered, these medications have been reviewed and there are no changes at this time.   4. Permanent atrial fibrillation Continue antiarrhythmia medications as already ordered, these medications have been reviewed and there are no changes at this time.  Continue anticoagulation as ordered by  Cardiology Service   5. Hyperlipidemia, unspecified hyperlipidemia type Continue statin as ordered and reviewed, no changes at this time    Hortencia Pilar, MD  03/04/2019 8:41 AM

## 2019-03-05 ENCOUNTER — Other Ambulatory Visit: Payer: Self-pay

## 2019-03-05 ENCOUNTER — Encounter (INDEPENDENT_AMBULATORY_CARE_PROVIDER_SITE_OTHER): Payer: Self-pay | Admitting: Vascular Surgery

## 2019-03-05 ENCOUNTER — Ambulatory Visit (INDEPENDENT_AMBULATORY_CARE_PROVIDER_SITE_OTHER): Payer: Medicare HMO | Admitting: Vascular Surgery

## 2019-03-05 ENCOUNTER — Ambulatory Visit (INDEPENDENT_AMBULATORY_CARE_PROVIDER_SITE_OTHER): Payer: Medicare HMO

## 2019-03-05 VITALS — BP 144/84 | HR 76 | Resp 19 | Ht 69.0 in | Wt 260.0 lb

## 2019-03-05 DIAGNOSIS — I4821 Permanent atrial fibrillation: Secondary | ICD-10-CM | POA: Diagnosis not present

## 2019-03-05 DIAGNOSIS — Z7982 Long term (current) use of aspirin: Secondary | ICD-10-CM

## 2019-03-05 DIAGNOSIS — I6521 Occlusion and stenosis of right carotid artery: Secondary | ICD-10-CM | POA: Diagnosis not present

## 2019-03-05 DIAGNOSIS — Z9889 Other specified postprocedural states: Secondary | ICD-10-CM

## 2019-03-05 DIAGNOSIS — I63239 Cerebral infarction due to unspecified occlusion or stenosis of unspecified carotid arteries: Secondary | ICD-10-CM

## 2019-03-05 DIAGNOSIS — I872 Venous insufficiency (chronic) (peripheral): Secondary | ICD-10-CM | POA: Diagnosis not present

## 2019-03-05 DIAGNOSIS — Z95828 Presence of other vascular implants and grafts: Secondary | ICD-10-CM

## 2019-03-05 DIAGNOSIS — I6523 Occlusion and stenosis of bilateral carotid arteries: Secondary | ICD-10-CM | POA: Diagnosis not present

## 2019-03-05 DIAGNOSIS — E785 Hyperlipidemia, unspecified: Secondary | ICD-10-CM

## 2019-03-05 DIAGNOSIS — Z87891 Personal history of nicotine dependence: Secondary | ICD-10-CM

## 2019-03-05 DIAGNOSIS — I10 Essential (primary) hypertension: Secondary | ICD-10-CM

## 2019-06-25 IMAGING — CT CT ANGIO NECK
3 of 7 series · 10 of 36 positions shown · IV contrast (APPLIED)
Comparison: .

CLINICAL DATA: Cerebral infarction due to occlusion of the left
carotid artery. Carotid artery stenosis, symptomatic, right.

EXAM:
CT ANGIOGRAPHY NECK
TECHNIQUE: Multidetector CT imaging of the neck was performed using the
standard protocol during bolus administration of intravenous
contrast. Multiplanar CT image reconstructions and MIPs were
obtained to evaluate the vascular anatomy. Carotid stenosis
measurements (when applicable) are obtained utilizing NASCET
criteria, using the distal internal carotid diameter as the
denominator.
CONTRAST:  75mL OMNIPAQUE IOHEXOL 350 MG/ML SOLN

[Series 4: cta neck · axial · 0.43mm/px · z∈[-253,-165]mm · 2 of 133 slices shown]
[im 45/133  soft-tissue]
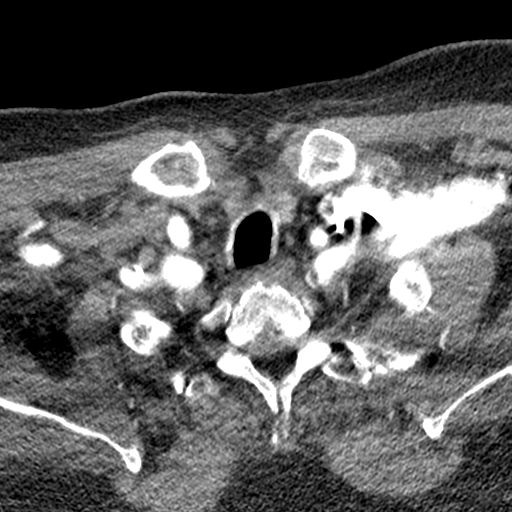
[im 89/133  soft-tissue]
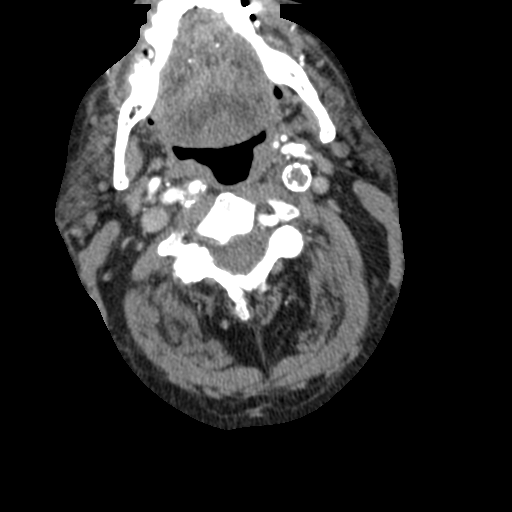

[Series 6: ax thin · axial · 0.47mm/px · z∈[-310,-115]mm · 6 of 273 slices shown]
[im 39/273  soft-tissue]
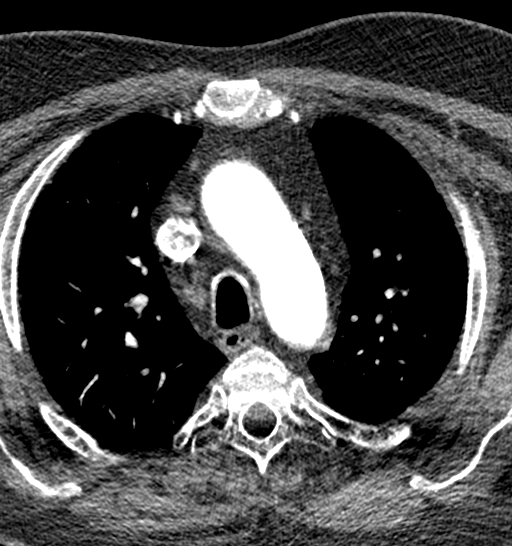
[im 78/273  bone]
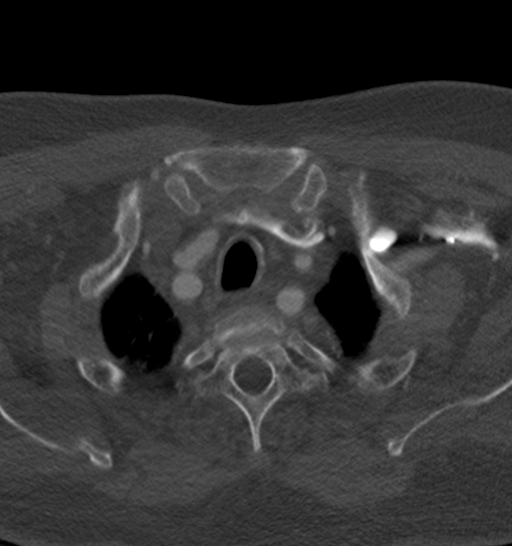
[im 117/273  soft-tissue]
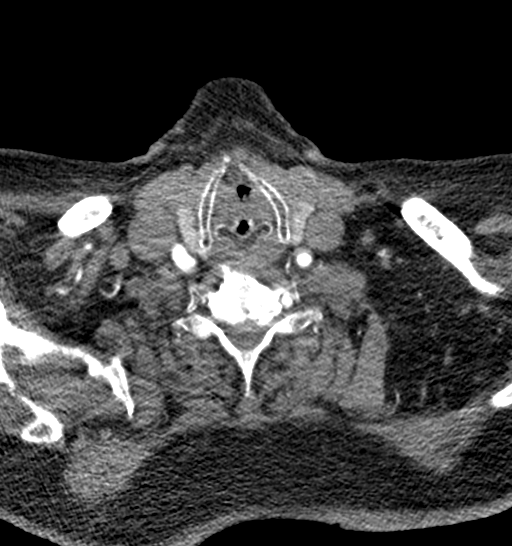
[im 156/273  bone]
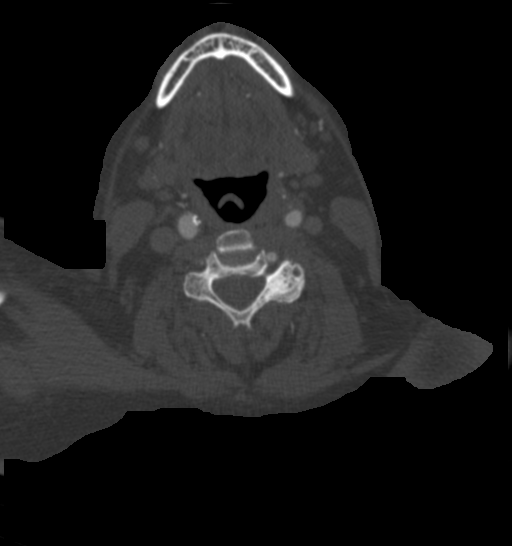
[im 195/273  soft-tissue]
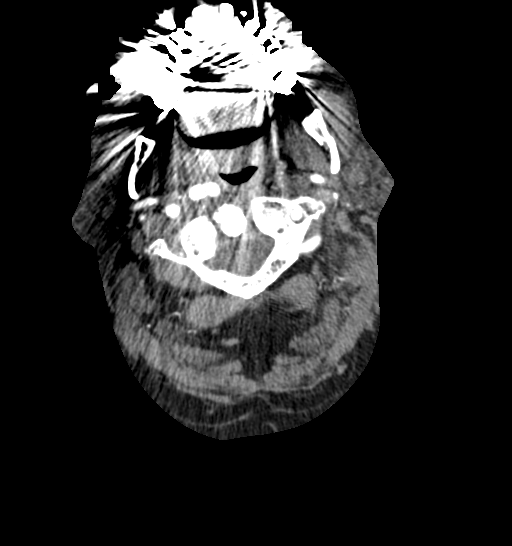
[im 234/273  bone]
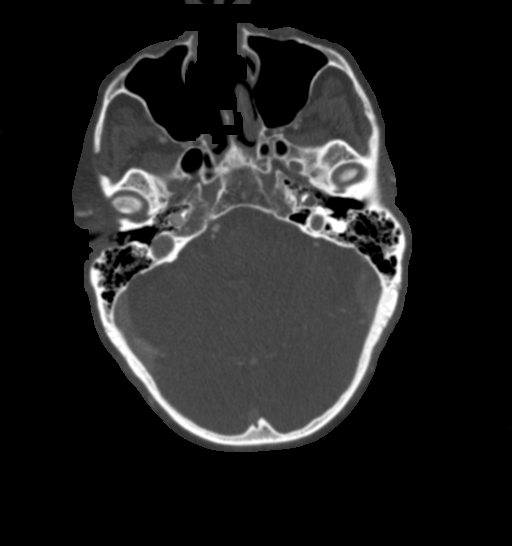

[Series 8: sagittal thin · sagittal · 0.44mm/px · 2 of 243 slices shown]
[im 83/243  soft-tissue]
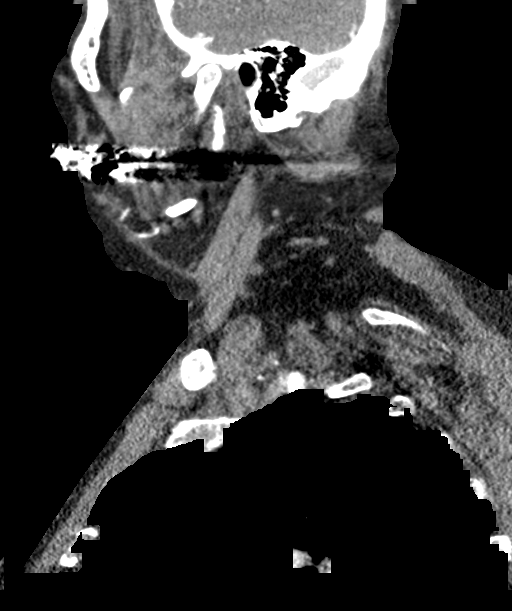
[im 161/243  soft-tissue]
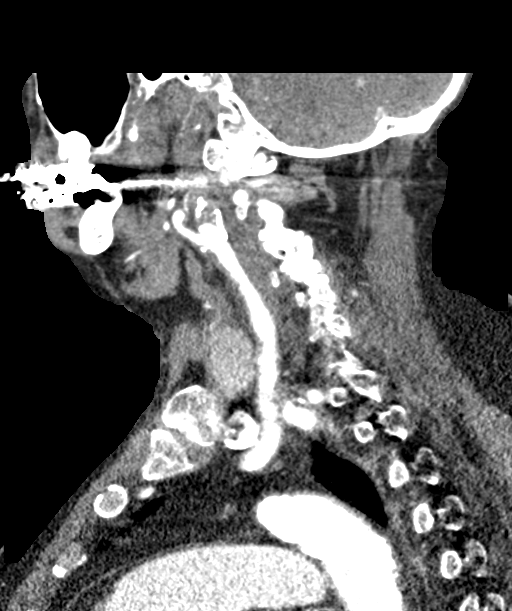

[10 of 36 positions shown; findings below may reference images not displayed]

MRI brain 08/28/2018. Cerebral arteriogram
08/27/2018. CTA of the head and neck 08/27/2018.
FINDINGS: Aortic arch: There is a common origin of the innominate artery and
the left common carotid artery. Atherosclerotic changes are present
in the distal arch. Left subclavian artery is within normal limits.

Right carotid system: Atherosclerotic changes are present in the
right common carotid artery without a significant stenosis relative
to the more distal vessel. Calcified and noncalcified plaque is
present in the right carotid bifurcation. There is a high-grade,
near complete scratched at there is a high-grade stenosis of the
proximal right internal carotid artery. The lumen is narrowed to
less than 1 mm. Moderate tortuosity of the more distal cervical
right ICA is again noted without a tandem stenosis.

Left carotid system: The left common carotid artery is occluded.

Vertebral arteries: The left vertebral artery is dominant. Right
vertebral artery is hypoplastic. There is a moderate stenosis of the
left vertebral artery at its origin. No tandem stenosis are present
in the neck. There is a high-grade stenosis of the V3 segment and
tandem stenosis at the V4 segment. There is marked irregularity
throughout the basilar artery.

Skeleton: Degenerative changes of the cervical spine are stable. No
focal lytic or blastic lesions are present.

Other neck: Subcentimeter paratracheal lymph nodes are stable. No
significant mass lesion is present. Salivary glands are within
normal limits. Thyroid is normal.

Upper chest: Centrilobular emphysematous changes are present.
Diffuse ground-glass attenuation is present, likely edema.
IMPRESSION: 1. High-grade, near occlusive stenosis of the proximal right
internal carotid artery. Lumen is narrowed to less than 1 mm.
2. Occluded left internal carotid artery without reconstitution in
the neck.
3. Moderate to severe stenosis of the distal left vertebral artery
at the V3 and V4 segments.
4. Diffuse irregularity of the basilar artery.
5. Hypoplastic right vertebral artery is occluded at C2.

## 2019-09-07 ENCOUNTER — Encounter (INDEPENDENT_AMBULATORY_CARE_PROVIDER_SITE_OTHER): Payer: Medicare HMO

## 2019-09-07 ENCOUNTER — Ambulatory Visit (INDEPENDENT_AMBULATORY_CARE_PROVIDER_SITE_OTHER): Payer: Medicare HMO | Admitting: Vascular Surgery

## 2019-09-30 ENCOUNTER — Ambulatory Visit (INDEPENDENT_AMBULATORY_CARE_PROVIDER_SITE_OTHER): Payer: Medicare HMO | Admitting: Nurse Practitioner

## 2019-09-30 ENCOUNTER — Other Ambulatory Visit: Payer: Self-pay

## 2019-09-30 ENCOUNTER — Encounter (INDEPENDENT_AMBULATORY_CARE_PROVIDER_SITE_OTHER): Payer: Self-pay | Admitting: Nurse Practitioner

## 2019-09-30 ENCOUNTER — Ambulatory Visit (INDEPENDENT_AMBULATORY_CARE_PROVIDER_SITE_OTHER): Payer: Medicare HMO

## 2019-09-30 ENCOUNTER — Encounter (INDEPENDENT_AMBULATORY_CARE_PROVIDER_SITE_OTHER): Payer: Self-pay

## 2019-09-30 VITALS — BP 145/80 | HR 74 | Resp 16

## 2019-09-30 DIAGNOSIS — E785 Hyperlipidemia, unspecified: Secondary | ICD-10-CM | POA: Diagnosis not present

## 2019-09-30 DIAGNOSIS — E1159 Type 2 diabetes mellitus with other circulatory complications: Secondary | ICD-10-CM | POA: Diagnosis not present

## 2019-09-30 DIAGNOSIS — I63239 Cerebral infarction due to unspecified occlusion or stenosis of unspecified carotid arteries: Secondary | ICD-10-CM | POA: Diagnosis not present

## 2019-09-30 DIAGNOSIS — I6521 Occlusion and stenosis of right carotid artery: Secondary | ICD-10-CM

## 2019-10-02 ENCOUNTER — Encounter (INDEPENDENT_AMBULATORY_CARE_PROVIDER_SITE_OTHER): Payer: Self-pay | Admitting: Nurse Practitioner

## 2019-10-02 NOTE — Progress Notes (Signed)
SUBJECTIVE:  Patient ID: Darren Frazier, male    DOB: 01-21-1941, 79 y.o.   MRN: [id number] Chief Complaint  Patient presents with  . Follow-up    ultrasound follow up    HPI  Darren Frazier is a 79 y.o. male The patient is seen for follow up evaluation of carotid stenosis. The carotid stenosis followed by ultrasound.   The patient denies amaurosis fugax. There is no recent history of TIA symptoms or focal motor deficits. There is no prior documented CVA.  The patient is taking enteric-coated aspirin 81 mg daily.  There is no history of migraine headaches. There is no history of seizures.  The patient has a history of coronary artery disease, no recent episodes of angina or shortness of breath. The patient denies PAD or claudication symptoms. There is a history of hyperlipidemia which is being treated with a statin.    Carotid Duplex done today shows left Ica occlusion with 1-39% right ICA stenosis .  No change compared to last study in 03/05/2019  Past Medical History:  Diagnosis Date  . ARF (acute respiratory failure) (HCC)   . Cardiomyopathy (HCC)   . Diabetes mellitus without complication (HCC)   . Dysphagia   . Dysrhythmia    atrial fibrillation  . Encephalopathy   . GERD (gastroesophageal reflux disease)   . H/O: urethral stricture    self caths occasionally  . Heart failure, unspecified (HCC)   . Hemiplegia affecting right dominant side (HCC)   . History of abdominal aortic aneurysm (AAA)    30/48mm in diameter, stable  . History of kidney stones 2017  . Hyperlipidemia   . Hypertension   . Hypothyroidism   . Morbid obesity with BMI of 40.0-44.9, adult (HCC) 2019  . Peripheral vascular disease (HCC) 2019   lymphadema both extremities, ulceration on left big toe  . Sleep apnea   . Stroke (HCC) 2011   x 2 within 6 months. some numbness over upper arms, balance off  . Tremors of nervous system   . Vitamin D deficiency     Past Surgical History:    Procedure Laterality Date  . APPENDECTOMY  1961  . CAROTID PTA/STENT INTERVENTION N/A 11/26/2018   Procedure: CAROTID PTA/STENT INTERVENTION;  Surgeon: Renford Dills, MD;  Location: [facility];  Service: Cardiovascular;  Laterality: N/A;  . CATARACT EXTRACTION, BILATERAL Bilateral   . CENTRAL LINE  08/29/2018      . COLONOSCOPY    . IR ANGIO INTRA EXTRACRAN SEL COM CAROTID INNOMINATE UNI R MOD SED  08/27/2018  . IR ANGIO VERTEBRAL SEL SUBCLAVIAN INNOMINATE UNI L MOD SED  08/27/2018  . IR CT HEAD LTD  08/27/2018  . IR PERCUTANEOUS ART THROMBECTOMY/INFUSION INTRACRANIAL INC DIAG ANGIO  08/27/2018  . LOWER EXTREMITY ANGIOGRAPHY Left 03/05/2018   Procedure: LOWER EXTREMITY ANGIOGRAPHY;  Surgeon: Renford Dills, MD;  Location: [facility];  Service: Cardiovascular;  Laterality: Left;  . RADIOLOGY WITH ANESTHESIA N/A 08/27/2018   Procedure: IR WITH ANESTHESIA;  Surgeon: Radiologist, Medication, MD;  Location: [facility] OR;  Service: Radiology;  Laterality: N/A;  . TONSILLECTOMY      Social History   Socioeconomic History  . Marital status: Married    Spouse name: Not on file  . Number of children: Not on file  . Years of education: Not on file  . Highest education level: Not on file  Occupational History  . Not on file  Tobacco Use  . Smoking  status: Former Smoker    Packs/day: 4.00    Years: 30.00    Pack years: 120.00    Types: Cigarettes    Quit date: 1984    Years since quitting: 37.1  . Smokeless tobacco: Never Used  Substance and Sexual Activity  . Alcohol use: No    Comment: occasional beer. couple years ago  . Drug use: No  . Sexual activity: Not on file  Other Topics Concern  . Not on file  Social History Narrative  . Not on file   Social Determinants of Health   Financial Resource Strain:   . Difficulty of Paying Living Expenses: Not on file  Food Insecurity:   . Worried About Charity fundraiser in the Last Year: Not on file  . Ran Out of  Food in the Last Year: Not on file  Transportation Needs:   . Lack of Transportation (Medical): Not on file  . Lack of Transportation (Non-Medical): Not on file  Physical Activity:   . Days of Exercise per Week: Not on file  . Minutes of Exercise per Session: Not on file  Stress:   . Feeling of Stress : Not on file  Social Connections:   . Frequency of Communication with Friends and Family: Not on file  . Frequency of Social Gatherings with Friends and Family: Not on file  . Attends Religious Services: Not on file  . Active Member of Clubs or Organizations: Not on file  . Attends Archivist Meetings: Not on file  . Marital Status: Not on file  Intimate Partner Violence:   . Fear of Current or Ex-Partner: Not on file  . Emotionally Abused: Not on file  . Physically Abused: Not on file  . Sexually Abused: Not on file    Family History  Problem Relation Age of Onset  . Diabetes Father     Allergies  Allergen Reactions  . Ace Inhibitors Other (See Comments)    unknown  . Omeprazole Diarrhea  . Statins Other (See Comments)    Abnormal lab results(pt unsure of what labs)     Review of Systems   Review of Systems: Negative Unless Checked Constitutional: [] Weight loss  [] Fever  [] Chills Cardiac: [] Chest pain   [x]  Atrial Fibrillation  [] Palpitations   [] Shortness of breath when laying flat   [] Shortness of breath with exertion. [] Shortness of breath at rest Vascular:  [] Pain in legs with walking   [] Pain in legs with standing [] Pain in legs when laying flat   [] Claudication    [] Pain in feet when laying flat    [] History of DVT   [] Phlebitis   [x] Swelling in legs   [] Varicose veins   [] Non-healing ulcers Pulmonary:   [] Uses home oxygen   [] Productive cough   [] Hemoptysis   [] Wheeze  [] COPD   [] Asthma Neurologic:  [] Dizziness   [] Seizures  [] Blackouts [] History of stroke   [] History of TIA  [] Aphasia   [] Temporary Blindness   [x] Weakness or numbness in arm   [x] Weakness  or numbness in leg Musculoskeletal:   [] Joint swelling   [] Joint pain   [] Low back pain  []  History of Knee Replacement [] Arthritis [] back Surgeries  []  Spinal Stenosis    Hematologic:  [] Easy bruising  [] Easy bleeding   [] Hypercoagulable state   [] Anemic Gastrointestinal:  [] Diarrhea   [] Vomiting  [] Gastroesophageal reflux/heartburn   [] Difficulty swallowing. [] Abdominal pain Genitourinary:  [] Chronic kidney disease   [] Difficult urination  [] Anuric   [] Blood in urine [] Frequent  urination  [] Burning with urination   [] Hematuria Skin:  [] Rashes   [] Ulcers [] Wounds Psychological:  [] History of anxiety   []  History of major depression  []  Memory Difficulties      OBJECTIVE:   Physical Exam  BP (!) 145/80 (BP Location: Left Arm)   Pulse 74   Resp 16   Gen: WD/WN, NAD Head: [place]/AT, No temporalis wasting.  Ear/Nose/Throat: Hearing grossly intact, nares w/o erythema or drainage Eyes: PER, EOMI, sclera nonicteric.  Neck: Supple, no masses.  No JVD.  Pulmonary:  Good air movement, no use of accessory muscles.  Cardiac: RRR Vascular:  Vessel Right Left  Radial Palpable Palpable   Gastrointestinal: soft, non-distended. No guarding/no peritoneal signs.  Musculoskeletal: Wheelchair bound, right sided weakness.  No deformity or atrophy.  Neurologic: Pain and light touch intact in extremities.  Symmetrical.  Speech is fluent. Motor exam as listed above. Psychiatric: Judgment intact, Mood & affect appropriate for pt's clinical situation. Dermatologic: No Venous rashes. No Ulcers Noted.  No changes consistent with cellulitis. Lymph : No Cervical lymphadenopathy, no lichenification or skin changes of chronic lymphedema.       ASSESSMENT AND PLAN:  1. Carotid artery stenosis, symptomatic, right Recommend:  Given the patient's asymptomatic subcritical stenosis no further invasive testing or surgery at this time.  Carotid Duplex done today shows left Ica occlusion with 1-39% right ICA stenosis  .  No change compared to last study in 03/05/2019  Continue antiplatelet therapy as prescribed Continue management of CAD, HTN and Hyperlipidemia Healthy heart diet,  encouraged exercise at least 4 times per week Follow up in 6 months with duplex ultrasound and physical exam   2. Hyperlipidemia, unspecified hyperlipidemia type Continue statin as ordered and reviewed, no changes at this time   3. Type 2 diabetes mellitus with other circulatory complication, unspecified whether long term insulin use (HCC) Continue hypoglycemic medications as already ordered, these medications have been reviewed and there are no changes at this time.  Hgb A1C to be monitored as already arranged by primary service    Current Outpatient Medications on File Prior to Visit  Medication Sig Dispense Refill  . acetaminophen (TYLENOL) 325 MG suppository Place 1-2 suppositories (325-650 mg total) rectally every 4 (four) hours as needed for mild pain (or temp >/= 101 F). 12 suppository 0  . acetaminophen (TYLENOL) 325 MG tablet Take 1-2 tablets (325-650 mg total) by mouth every 4 (four) hours as needed for mild pain (or temp >/= 101 F).    albuterol (PROVENTIL) (2.5 MG/3ML) 0.083% nebulizer solution Take 3 mLs (2.5 mg total) by nebulization every 6 (six) hours as needed for wheezing or shortness of breath. 75 mL 12  . amiodarone (PACERONE) 200 MG tablet Take 1 tablet (200 mg total) by mouth daily.    apixaban (ELIQUIS) 5 MG TABS tablet Take 1 tablet (5 mg total) by mouth 2 (two) times daily. 60 tablet   . benzonatate (TESSALON) 100 MG capsule Take 100 mg by mouth 3 (three) times daily.    . Cholecalciferol (VITAMIN D3) 1000 units CAPS Take 1,000 Units by mouth daily.     . clopidogrel (PLAVIX) 75 MG tablet Take 1 tablet (75 mg total) by mouth daily. 90 tablet 3  . docusate sodium (COLACE) 100 MG capsule Take by mouth.    . ezetimibe (ZETIA) 10 MG tablet Take 1 tablet (10 mg total) by mouth daily.    .  fluticasone (FLONASE) 50 MCG/ACT nasal spray Place 2 sprays  into both nostrils daily as needed for allergies or rhinitis.     [name] levothyroxine (SYNTHROID, LEVOTHROID) 100 MCG tablet Take 1 tablet (100 mcg total) by mouth daily before breakfast.    . metoprolol tartrate (LOPRESSOR) 50 MG tablet Take 50 mg by mouth 2 (two) times daily.    . Multiple Vitamin (MULTIVITAMIN WITH MINERALS) TABS tablet Take 1 tablet by mouth daily.    [name] nystatin cream (MYCOSTATIN) Apply topically.    . Omega-3 Fatty Acids (FISH OIL) 1000 MG CAPS Take 1,000 mg by mouth 3 (three) times daily.    . QUEtiapine (SEROQUEL) 25 MG tablet Take 25 mg by mouth See admin instructions. Take with 50 mg to equal 75 mg at bedtime    . QUEtiapine (SEROQUEL) 50 MG tablet Take 1.5 tablets (75 mg total) by mouth at bedtime. (Patient taking differently: Take 50 mg by mouth See admin instructions. Take with 25 mg to equal 75 mg at bedtime)    . senna-docusate (SENOKOT-S) 8.6-50 MG tablet Take 1 tablet by mouth at bedtime as needed for mild constipation.    . vitamin C (ASCORBIC ACID) 500 MG tablet Take 500 mg by mouth daily.     No current facility-administered medications on file prior to visit.    There are no Patient Instructions on file for this visit. No follow-ups on file.   Georgiana Spinner, NP  This note was completed with Office manager.  Any errors are purely unintentional.

## 2020-01-22 DIAGNOSIS — Z8616 Personal history of COVID-19: Secondary | ICD-10-CM | POA: Insufficient documentation

## 2020-04-04 ENCOUNTER — Encounter (INDEPENDENT_AMBULATORY_CARE_PROVIDER_SITE_OTHER): Payer: Medicare HMO

## 2020-04-04 ENCOUNTER — Ambulatory Visit (INDEPENDENT_AMBULATORY_CARE_PROVIDER_SITE_OTHER): Payer: Medicare HMO | Admitting: Vascular Surgery

## 2020-04-18 ENCOUNTER — Other Ambulatory Visit (INDEPENDENT_AMBULATORY_CARE_PROVIDER_SITE_OTHER): Payer: Self-pay | Admitting: Nurse Practitioner

## 2020-04-18 DIAGNOSIS — I6522 Occlusion and stenosis of left carotid artery: Secondary | ICD-10-CM

## 2020-04-21 ENCOUNTER — Encounter (INDEPENDENT_AMBULATORY_CARE_PROVIDER_SITE_OTHER): Payer: Self-pay | Admitting: Vascular Surgery

## 2020-04-21 ENCOUNTER — Ambulatory Visit (INDEPENDENT_AMBULATORY_CARE_PROVIDER_SITE_OTHER): Payer: Medicare HMO | Admitting: Vascular Surgery

## 2020-04-21 ENCOUNTER — Other Ambulatory Visit: Payer: Self-pay

## 2020-04-21 ENCOUNTER — Ambulatory Visit (INDEPENDENT_AMBULATORY_CARE_PROVIDER_SITE_OTHER): Payer: Medicare HMO

## 2020-04-21 VITALS — BP 171/90 | HR 72 | Resp 16

## 2020-04-21 DIAGNOSIS — I70212 Atherosclerosis of native arteries of extremities with intermittent claudication, left leg: Secondary | ICD-10-CM | POA: Diagnosis not present

## 2020-04-21 DIAGNOSIS — I714 Abdominal aortic aneurysm, without rupture, unspecified: Secondary | ICD-10-CM | POA: Insufficient documentation

## 2020-04-21 DIAGNOSIS — I10 Essential (primary) hypertension: Secondary | ICD-10-CM

## 2020-04-21 DIAGNOSIS — I6522 Occlusion and stenosis of left carotid artery: Secondary | ICD-10-CM

## 2020-04-21 DIAGNOSIS — I63239 Cerebral infarction due to unspecified occlusion or stenosis of unspecified carotid arteries: Secondary | ICD-10-CM | POA: Diagnosis not present

## 2020-04-21 DIAGNOSIS — E1159 Type 2 diabetes mellitus with other circulatory complications: Secondary | ICD-10-CM

## 2020-04-21 DIAGNOSIS — E785 Hyperlipidemia, unspecified: Secondary | ICD-10-CM

## 2020-04-21 DIAGNOSIS — N281 Cyst of kidney, acquired: Secondary | ICD-10-CM | POA: Insufficient documentation

## 2020-04-21 DIAGNOSIS — I872 Venous insufficiency (chronic) (peripheral): Secondary | ICD-10-CM

## 2020-04-21 DIAGNOSIS — L409 Psoriasis, unspecified: Secondary | ICD-10-CM | POA: Insufficient documentation

## 2020-04-21 NOTE — Progress Notes (Signed)
MRN : [id number]  Darren Frazier is a 79 y.o. (1940-10-21) male who presents with chief complaint of  Chief Complaint  Patient presents with  . Follow-up    ulrasound follow up  .  History of Present Illness:   The patient is seen for follow up evaluation of carotid stenosis status post right carotid endarterectomy on 11/26/2018 with a 10 x 8 x 40 exact stent .  There were no post operative problems or complications related to the surgery.  The patient denies neck or incisional pain.  The patient denies interval amaurosis fugax. There is no recent history of TIA symptoms or focal motor deficits. There is no prior documented CVA.  The patient denies headache.  The patient is taking enteric-coated aspirin 81 mg daily.  The patient has a history of coronary artery disease, no recent episodes of angina or shortness of breath. The patient denies PAD or claudication symptoms. There is a history of hyperlipidemia which is being treated with a statin.   Current Meds  Medication Sig  . acetaminophen (TYLENOL) 325 MG suppository Place 1-2 suppositories (325-650 mg total) rectally every 4 (four) hours as needed for mild pain (or temp >/= 101 F).  [name] acetaminophen (TYLENOL) 325 MG tablet Take 1-2 tablets (325-650 mg total) by mouth every 4 (four) hours as needed for mild pain (or temp >/= 101 F).  [name] albuterol (PROVENTIL) (2.5 MG/3ML) 0.083% nebulizer solution Take 3 mLs (2.5 mg total) by nebulization every 6 (six) hours as needed for wheezing or shortness of breath.  [name] amiodarone (PACERONE) 200 MG tablet Take 1 tablet (200 mg total) by mouth daily.  [name] apixaban (ELIQUIS) 5 MG TABS tablet Take 1 tablet (5 mg total) by mouth 2 (two) times daily.  . benzonatate (TESSALON) 100 MG capsule Take 100 mg by mouth 3 (three) times daily.  . Cholecalciferol (VITAMIN D3) 1000 units CAPS Take 1,000 Units by mouth daily.   [name] docusate sodium (COLACE) 100 MG capsule Take by mouth.  . ezetimibe (ZETIA)  10 MG tablet Take 1 tablet (10 mg total) by mouth daily.  . fluticasone (FLONASE) 50 MCG/ACT nasal spray Place 2 sprays into both nostrils daily as needed for allergies or rhinitis.   [name] levothyroxine (SYNTHROID, LEVOTHROID) 100 MCG tablet Take 1 tablet (100 mcg total) by mouth daily before breakfast.  . metoprolol tartrate (LOPRESSOR) 50 MG tablet Take 50 mg by mouth 2 (two) times daily.  . Multiple Vitamin (MULTIVITAMIN WITH MINERALS) TABS tablet Take 1 tablet by mouth daily.  . Omega-3 Fatty Acids (FISH OIL) 1000 MG CAPS Take 1,000 mg by mouth 3 (three) times daily.  . QUEtiapine (SEROQUEL) 25 MG tablet Take 25 mg by mouth See admin instructions. Take with 50 mg to equal 75 mg at bedtime  . QUEtiapine (SEROQUEL) 50 MG tablet Take 1.5 tablets (75 mg total) by mouth at bedtime. (Patient taking differently: Take 50 mg by mouth See admin instructions. Take with 25 mg to equal 75 mg at bedtime)  . senna-docusate (SENOKOT-S) 8.6-50 MG tablet Take 1 tablet by mouth at bedtime as needed for mild constipation.  . vitamin C (ASCORBIC ACID) 500 MG tablet Take 500 mg by mouth daily.    Past Medical History:  Diagnosis Date  . ARF (acute respiratory failure) (HCC)   . Cardiomyopathy (HCC)   . Diabetes mellitus without complication (HCC)   . Dysphagia   . Dysrhythmia    atrial fibrillation  . Encephalopathy   . GERD (  gastroesophageal reflux disease)   . H/O: urethral stricture    self caths occasionally  . Heart failure, unspecified (HCC)   . Hemiplegia affecting right dominant side (HCC)   . History of abdominal aortic aneurysm (AAA)    30/1mm in diameter, stable  . History of kidney stones 2017  . Hyperlipidemia   . Hypertension   . Hypothyroidism   . Morbid obesity with BMI of 40.0-44.9, adult (HCC) 2019  . Peripheral vascular disease (HCC) 2019   lymphadema both extremities, ulceration on left big toe  . Sleep apnea   . Stroke (HCC) 2011   x 2 within 6 months. some numbness over upper  arms, balance off  . Tremors of nervous system   . Vitamin D deficiency     Past Surgical History:  Procedure Laterality Date  . APPENDECTOMY  1961  . CAROTID PTA/STENT INTERVENTION N/A 11/26/2018   Procedure: CAROTID PTA/STENT INTERVENTION;  Surgeon: Renford Dills, MD;  Location: [facility];  Service: Cardiovascular;  Laterality: N/A;  . CATARACT EXTRACTION, BILATERAL Bilateral   . CENTRAL LINE  08/29/2018      . COLONOSCOPY    . IR ANGIO INTRA EXTRACRAN SEL COM CAROTID INNOMINATE UNI R MOD SED  08/27/2018  . IR ANGIO VERTEBRAL SEL SUBCLAVIAN INNOMINATE UNI L MOD SED  08/27/2018  . IR CT HEAD LTD  08/27/2018  . IR PERCUTANEOUS ART THROMBECTOMY/INFUSION INTRACRANIAL INC DIAG ANGIO  08/27/2018  . LOWER EXTREMITY ANGIOGRAPHY Left 03/05/2018   Procedure: LOWER EXTREMITY ANGIOGRAPHY;  Surgeon: Renford Dills, MD;  Location: [facility];  Service: Cardiovascular;  Laterality: Left;  . RADIOLOGY WITH ANESTHESIA N/A 08/27/2018   Procedure: IR WITH ANESTHESIA;  Surgeon: Radiologist, Medication, MD;  Location: [facility] OR;  Service: Radiology;  Laterality: N/A;  . TONSILLECTOMY      Social History Social History   Tobacco Use  . Smoking status: Former Smoker    Packs/day: 4.00    Years: 30.00    Pack years: 120.00    Types: Cigarettes    Quit date: 1984    Years since quitting: 37.6  . Smokeless tobacco: Never Used  Vaping Use  . Vaping Use: Never used  Substance Use Topics  . Alcohol use: No    Comment: occasional beer. couple years ago  . Drug use: No    Family History Family History  Problem Relation Age of Onset  . Diabetes Father     Allergies  Allergen Reactions  . Ace Inhibitors Other (See Comments)    unknown  . Omeprazole Diarrhea  . Statins Other (See Comments)    Abnormal lab results(pt unsure of what labs)     REVIEW OF SYSTEMS (Negative unless checked)  Constitutional: [] Weight loss  [] Fever  [] Chills Cardiac: [] Chest pain    [] Chest pressure   [] Palpitations   [] Shortness of breath when laying flat   [] Shortness of breath with exertion. Vascular:  [] Pain in legs with walking   [] Pain in legs at rest  [] History of DVT   [] Phlebitis   [] Swelling in legs   [] Varicose veins   [] Non-healing ulcers Pulmonary:   [] Uses home oxygen   [] Productive cough   [] Hemoptysis   [] Wheeze  [] COPD   [] Asthma Neurologic:  [] Dizziness   [] Seizures   [] History of stroke   [] History of TIA  [] Aphasia   [] Vissual changes   [] Weakness or numbness in arm   [] Weakness or numbness in leg Musculoskeletal:   [] Joint swelling   [  x]Joint pain   [] Low back pain Hematologic:  [] Easy bruising  [] Easy bleeding   [] Hypercoagulable state   [] Anemic Gastrointestinal:  [] Diarrhea   [] Vomiting  [] Gastroesophageal reflux/heartburn   [] Difficulty swallowing. Genitourinary:  [x] Chronic kidney disease   [] Difficult urination  [] Frequent urination   [] Blood in urine Skin:  [] Rashes   [] Ulcers  Psychological:  [] History of anxiety   []  History of major depression.  Physical Examination  Vitals:   04/21/20 1033 04/21/20 1034  BP: (!) 174/90 (!) 171/90  Pulse: 72   Resp: 16    There is no height or weight on file to calculate BMI. Gen: WD/WN, NAD Head: [place]/AT, No temporalis wasting.  Ear/Nose/Throat: Hearing grossly intact, nares w/o erythema or drainage Eyes: PER, EOMI, sclera nonicteric.  Neck: Supple, no large masses.   Pulmonary:  Good air movement, no audible wheezing bilaterally, no use of accessory muscles.  Cardiac: RRR, no JVD Vascular: scattered varicosities present bilaterally.  Severe venous stasis changes to the legs bilaterally.  2-3+ soft pitting edema Vessel Right Left  Radial Palpable Palpable  Gastrointestinal: Non-distended. No guarding/no peritoneal signs.  Musculoskeletal: M/S 5/5 throughout.  No deformity or atrophy.  Neurologic: CN 2-12 intact. Symmetrical.  Speech is fluent. Motor exam as listed above. Psychiatric: Judgment  intact, Mood & affect appropriate for pt's clinical situation. Dermatologic: No rashes or ulcers noted.  No changes consistent with cellulitis.   CBC Lab Results  Component Value Date   WBC 10.6 (H) 11/27/2018   HGB 11.9 (L) 11/27/2018   HCT 37.3 (L) 11/27/2018   MCV 93.5 11/27/2018   PLT 327 11/27/2018    BMET    Component Value Date/Time   NA 137 11/27/2018 0427   NA 137 03/07/2013 1849   K 3.6 11/27/2018 0427   K 3.6 03/07/2013 1849   CL 100 11/27/2018 0427   CL 101 03/07/2013 1849   CO2 27 11/27/2018 0427   CO2 26 03/07/2013 1849   GLUCOSE 128 (H) 11/27/2018 0427   GLUCOSE 135 (H) 03/07/2013 1849   BUN 11 11/27/2018 0427   BUN 18 03/07/2013 1849   CREATININE 0.99 11/27/2018 0427   CREATININE 1.06 03/07/2013 1849   CALCIUM 8.8 (L) 11/27/2018 0427   CALCIUM 9.4 03/07/2013 1849   GFRNONAA >60 11/27/2018 0427   GFRNONAA >60 03/07/2013 1849   GFRAA >60 11/27/2018 0427   GFRAA >60 03/07/2013 1849   CrCl cannot be calculated (Patient's most recent lab result is older than the maximum 21 days allowed.).  COAG Lab Results  Component Value Date   INR 1.73 09/18/2018   INR 1.50 08/27/2018   INR 1.1 03/07/2013    Radiology No results found.   Assessment/Plan 1. Carotid stenosis, symptomatic, with infarction [place]) Recommend:  The patient is s/p successful right carotid stent  Duplex ultrasound preoperatively shows <30% RICA (s/p stent) and occlusion of the LICA (known).  Continue antiplatelet therapy as prescribed.  The patient had raise concerns about Plavix with the Eliquis stating he was also taking a baby aspirin as well.  I spoke with his wife by telephone today and he is not taking a baby aspirin.  He does not have any significant bleeding issues.  We have therefore agreed that we will continue Plavix with Eliquis given his carotid stent in association with his atrial fibrillation.  Continue management of CAD, HTN and Hyperlipidemia Healthy heart diet,   encouraged exercise at least 4 times per week  Follow up in 12 months with duplex ultrasound and  physical exam based on the patient's carotid stenting  - VAS US CAROTID; Future  2. Chronic venous insufficiency No surgery or intervention at this point in time.    I have had a long discussion with the patient regarding venous insufficiency and why it  causes symptoms. I have discussed with the patient the chronic skin changes that accompany venous insufficiency and the long term sequela such as infection and ulceration.  Patient will begin wearing graduated compression stockings class 1 (20-30 mmHg) or compression wraps on a daily basis a prescription was given. The patient will put the stockings on first thing in the morning and removing them in the evening. The patient is instructed specifically not to sleep in the stockings.    In addition, behavioral modification including several periods of elevation of the lower extremities during the day will be continued. I have demonstrated that proper elevation is a position with the ankles at heart level.  The patient is instructed to begin routine exercise, especially walking on a daily basis  3. Atherosclerosis of native artery of left lower extremity with intermittent claudication (HCC)  Recommend:  The patient has evidence of atherosclerosis of the lower extremities with claudication.  The patient does not voice lifestyle limiting changes at this point in time.  Noninvasive studies do not suggest clinically significant change.  No invasive studies, angiography or surgery at this time The patient should continue walking and begin a more formal exercise program.  The patient should continue antiplatelet therapy and aggressive treatment of the lipid abnormalities  No changes in the patient's medications at this time  The patient should continue wearing graduated compression socks 10-15 mmHg strength to control the mild edema.    4. Type 2  diabetes mellitus with other circulatory complication, unspecified whether long term insulin use (HCC) Continue hypoglycemic medications as already ordered, these medications have been reviewed and there are no changes at this time.  Hgb A1C to be monitored as already arranged by primary service   5. Hyperlipidemia, unspecified hyperlipidemia type Continue statin as ordered and reviewed, no changes at this time   6. Essential hypertension Continue antihypertensive medications as already ordered, these medications have been reviewed and there are no changes at this time.     Levora Dredge, MD  04/21/2020 10:36 AM

## 2021-04-20 ENCOUNTER — Ambulatory Visit (INDEPENDENT_AMBULATORY_CARE_PROVIDER_SITE_OTHER): Payer: Medicare HMO | Admitting: Vascular Surgery

## 2021-04-20 ENCOUNTER — Encounter (INDEPENDENT_AMBULATORY_CARE_PROVIDER_SITE_OTHER): Payer: Medicare HMO

## 2021-04-28 ENCOUNTER — Emergency Department: Payer: Medicare HMO

## 2021-04-28 ENCOUNTER — Inpatient Hospital Stay
Admission: EM | Admit: 2021-04-28 | Discharge: 2021-05-01 | DRG: 689 | Disposition: A | Discharge status: Home / Self Care | Payer: Medicare HMO | Attending: Internal Medicine | Admitting: Internal Medicine

## 2021-04-28 DIAGNOSIS — Z8616 Personal history of COVID-19: Secondary | ICD-10-CM | POA: Diagnosis present

## 2021-04-28 DIAGNOSIS — F419 Anxiety disorder, unspecified: Secondary | ICD-10-CM | POA: Diagnosis present

## 2021-04-28 DIAGNOSIS — Z888 Allergy status to other drugs, medicaments and biological substances status: Secondary | ICD-10-CM

## 2021-04-28 DIAGNOSIS — I10 Essential (primary) hypertension: Secondary | ICD-10-CM | POA: Diagnosis present

## 2021-04-28 DIAGNOSIS — I429 Cardiomyopathy, unspecified: Secondary | ICD-10-CM | POA: Diagnosis present

## 2021-04-28 DIAGNOSIS — I4891 Unspecified atrial fibrillation: Secondary | ICD-10-CM | POA: Diagnosis present

## 2021-04-28 DIAGNOSIS — E039 Hypothyroidism, unspecified: Secondary | ICD-10-CM | POA: Diagnosis present

## 2021-04-28 DIAGNOSIS — N179 Acute kidney failure, unspecified: Secondary | ICD-10-CM | POA: Diagnosis not present

## 2021-04-28 DIAGNOSIS — G4733 Obstructive sleep apnea (adult) (pediatric): Secondary | ICD-10-CM | POA: Diagnosis present

## 2021-04-28 DIAGNOSIS — G8191 Hemiplegia, unspecified affecting right dominant side: Secondary | ICD-10-CM | POA: Diagnosis present

## 2021-04-28 DIAGNOSIS — L8990 Pressure ulcer of unspecified site, unspecified stage: Secondary | ICD-10-CM | POA: Insufficient documentation

## 2021-04-28 DIAGNOSIS — R4182 Altered mental status, unspecified: Secondary | ICD-10-CM

## 2021-04-28 DIAGNOSIS — R627 Adult failure to thrive: Secondary | ICD-10-CM | POA: Diagnosis present

## 2021-04-28 DIAGNOSIS — G4700 Insomnia, unspecified: Secondary | ICD-10-CM | POA: Diagnosis present

## 2021-04-28 DIAGNOSIS — I714 Abdominal aortic aneurysm, without rupture: Secondary | ICD-10-CM | POA: Diagnosis present

## 2021-04-28 DIAGNOSIS — Z7902 Long term (current) use of antithrombotics/antiplatelets: Secondary | ICD-10-CM

## 2021-04-28 DIAGNOSIS — Z20822 Contact with and (suspected) exposure to covid-19: Secondary | ICD-10-CM | POA: Diagnosis present

## 2021-04-28 DIAGNOSIS — L89302 Pressure ulcer of unspecified buttock, stage 2: Secondary | ICD-10-CM | POA: Diagnosis present

## 2021-04-28 DIAGNOSIS — Z993 Dependence on wheelchair: Secondary | ICD-10-CM

## 2021-04-28 DIAGNOSIS — N35919 Unspecified urethral stricture, male, unspecified site: Secondary | ICD-10-CM | POA: Diagnosis present

## 2021-04-28 DIAGNOSIS — Z79890 Hormone replacement therapy (postmenopausal): Secondary | ICD-10-CM

## 2021-04-28 DIAGNOSIS — I4821 Permanent atrial fibrillation: Secondary | ICD-10-CM | POA: Diagnosis not present

## 2021-04-28 DIAGNOSIS — N1831 Chronic kidney disease, stage 3a: Secondary | ICD-10-CM | POA: Diagnosis present

## 2021-04-28 DIAGNOSIS — Z79899 Other long term (current) drug therapy: Secondary | ICD-10-CM

## 2021-04-28 DIAGNOSIS — I872 Venous insufficiency (chronic) (peripheral): Secondary | ICD-10-CM | POA: Diagnosis present

## 2021-04-28 DIAGNOSIS — Z7401 Bed confinement status: Secondary | ICD-10-CM

## 2021-04-28 DIAGNOSIS — E1122 Type 2 diabetes mellitus with diabetic chronic kidney disease: Secondary | ICD-10-CM | POA: Diagnosis present

## 2021-04-28 DIAGNOSIS — Z9119 Patient's noncompliance with other medical treatment and regimen: Secondary | ICD-10-CM

## 2021-04-28 DIAGNOSIS — Z87891 Personal history of nicotine dependence: Secondary | ICD-10-CM

## 2021-04-28 DIAGNOSIS — K219 Gastro-esophageal reflux disease without esophagitis: Secondary | ICD-10-CM | POA: Diagnosis present

## 2021-04-28 DIAGNOSIS — E785 Hyperlipidemia, unspecified: Secondary | ICD-10-CM | POA: Diagnosis present

## 2021-04-28 DIAGNOSIS — I129 Hypertensive chronic kidney disease with stage 1 through stage 4 chronic kidney disease, or unspecified chronic kidney disease: Secondary | ICD-10-CM | POA: Diagnosis present

## 2021-04-28 DIAGNOSIS — N390 Urinary tract infection, site not specified: Secondary | ICD-10-CM | POA: Diagnosis not present

## 2021-04-28 DIAGNOSIS — Z7901 Long term (current) use of anticoagulants: Secondary | ICD-10-CM

## 2021-04-28 DIAGNOSIS — Z6838 Body mass index (BMI) 38.0-38.9, adult: Secondary | ICD-10-CM

## 2021-04-28 DIAGNOSIS — I70219 Atherosclerosis of native arteries of extremities with intermittent claudication, unspecified extremity: Secondary | ICD-10-CM | POA: Diagnosis present

## 2021-04-28 DIAGNOSIS — I509 Heart failure, unspecified: Secondary | ICD-10-CM

## 2021-04-28 DIAGNOSIS — E46 Unspecified protein-calorie malnutrition: Secondary | ICD-10-CM | POA: Diagnosis present

## 2021-04-28 DIAGNOSIS — Z8673 Personal history of transient ischemic attack (TIA), and cerebral infarction without residual deficits: Secondary | ICD-10-CM

## 2021-04-28 DIAGNOSIS — Z28310 Unvaccinated for covid-19: Secondary | ICD-10-CM

## 2021-04-28 DIAGNOSIS — G9341 Metabolic encephalopathy: Secondary | ICD-10-CM | POA: Diagnosis present

## 2021-04-28 DIAGNOSIS — E1151 Type 2 diabetes mellitus with diabetic peripheral angiopathy without gangrene: Secondary | ICD-10-CM | POA: Diagnosis present

## 2021-04-28 LAB — URINALYSIS, COMPLETE (UACMP) WITH MICROSCOPIC
Bilirubin Urine: NEGATIVE
Glucose, UA: NEGATIVE mg/dL
Ketones, ur: NEGATIVE mg/dL
Nitrite: NEGATIVE
Protein, ur: 30 mg/dL — AB
Specific Gravity, Urine: 1.015 (ref 1.005–1.030)
WBC, UA: >50 WBC/hpf — ABNORMAL HIGH (ref 0–5)
pH: 5 (ref 5.0–8.0)

## 2021-04-28 LAB — CBC WITH DIFFERENTIAL/PLATELET
Abs Immature Granulocytes: 0.18 10*3/uL — ABNORMAL HIGH (ref 0.00–0.07)
Basophils Absolute: 0.1 10*3/uL (ref 0.0–0.1)
Basophils Relative: 0 %
Eosinophils Absolute: 0 10*3/uL (ref 0.0–0.5)
Eosinophils Relative: 0 %
HCT: 37.7 % — ABNORMAL LOW (ref 39.0–52.0)
Hemoglobin: 13.2 g/dL (ref 13.0–17.0)
Immature Granulocytes: 1 %
Lymphocytes Relative: 7 %
Lymphs Abs: 1.5 10*3/uL (ref 0.7–4.0)
MCH: 32.8 pg (ref 26.0–34.0)
MCHC: 35 g/dL (ref 30.0–36.0)
MCV: 93.8 fL (ref 80.0–100.0)
Monocytes Absolute: 2.5 10*3/uL — ABNORMAL HIGH (ref 0.1–1.0)
Monocytes Relative: 11 %
Neutro Abs: 19 10*3/uL — ABNORMAL HIGH (ref 1.7–7.7)
Neutrophils Relative %: 81 %
Platelets: 445 10*3/uL — ABNORMAL HIGH (ref 150–400)
RBC: 4.02 MIL/uL — ABNORMAL LOW (ref 4.22–5.81)
RDW: 14.1 % (ref 11.5–15.5)
WBC: 23.3 10*3/uL — ABNORMAL HIGH (ref 4.0–10.5)
nRBC: 0 % (ref 0.0–0.2)

## 2021-04-28 LAB — COMPREHENSIVE METABOLIC PANEL
ALT: 12 U/L (ref 0–44)
AST: 20 U/L (ref 15–41)
Albumin: 3.2 g/dL — ABNORMAL LOW (ref 3.5–5.0)
Alkaline Phosphatase: 86 U/L (ref 38–126)
Anion gap: 10 (ref 5–15)
BUN: 23 mg/dL (ref 8–23)
CO2: 26 mmol/L (ref 22–32)
Calcium: 9.4 mg/dL (ref 8.9–10.3)
Chloride: 94 mmol/L — ABNORMAL LOW (ref 98–111)
Creatinine, Ser: 1.91 mg/dL — ABNORMAL HIGH (ref 0.61–1.24)
GFR, Estimated: 35 mL/min — ABNORMAL LOW (ref >60)
Glucose, Bld: 148 mg/dL — ABNORMAL HIGH (ref 70–99)
Potassium: 5 mmol/L (ref 3.5–5.1)
Sodium: 130 mmol/L — ABNORMAL LOW (ref 135–145)
Total Bilirubin: 0.9 mg/dL (ref 0.3–1.2)
Total Protein: 8 g/dL (ref 6.5–8.1)

## 2021-04-28 LAB — AMMONIA: Ammonia: <9 umol/L — ABNORMAL LOW (ref 9–35)

## 2021-04-28 LAB — RESP PANEL BY RT-PCR (FLU A&B, COVID) ARPGX2
Influenza A by PCR: NEGATIVE
Influenza B by PCR: NEGATIVE
SARS Coronavirus 2 by RT PCR: NEGATIVE

## 2021-04-28 LAB — LACTIC ACID, PLASMA
Lactic Acid, Venous: 1.5 mmol/L (ref 0.5–1.9)
Lactic Acid, Venous: 1.5 mmol/L (ref 0.5–1.9)

## 2021-04-28 LAB — TROPONIN I (HIGH SENSITIVITY)
Troponin I (High Sensitivity): 16 ng/L (ref <18)
Troponin I (High Sensitivity): 17 ng/L (ref <18)

## 2021-04-28 MED ORDER — LEVOTHYROXINE SODIUM 50 MCG PO TABS: 150.0000 ug | ORAL_TABLET | Freq: Every day | ORAL | Status: DC

## 2021-04-28 MED ORDER — EZETIMIBE 10 MG PO TABS
10.0000 mg | ORAL_TABLET | Freq: Every day | ORAL | Status: DC
  Administered 2021-04-29 – 2021-05-01 (×3): 10 mg via ORAL
  Filled 2021-04-28 (×3): qty 1

## 2021-04-28 MED ORDER — INSULIN ASPART 100 UNIT/ML IJ SOLN: 0.0000 [IU] | Freq: Every day | INTRAMUSCULAR | Status: DC

## 2021-04-28 MED ORDER — ASCORBIC ACID 500 MG PO TABS
500.0000 mg | ORAL_TABLET | Freq: Every day | ORAL | Status: DC
  Administered 2021-04-29 – 2021-05-01 (×3): 500 mg via ORAL
  Filled 2021-04-28 (×2): qty 1

## 2021-04-28 MED ORDER — ONDANSETRON HCL 4 MG PO TABS: 4.0000 mg | ORAL_TABLET | Freq: Four times a day (QID) | ORAL | Status: DC | PRN

## 2021-04-28 MED ORDER — AMIODARONE HCL 200 MG PO TABS
200.0000 mg | ORAL_TABLET | Freq: Every day | ORAL | Status: DC
  Administered 2021-04-29 – 2021-05-01 (×3): 200 mg via ORAL
  Filled 2021-04-28 (×3): qty 1

## 2021-04-28 MED ORDER — METOPROLOL SUCCINATE ER 50 MG PO TB24
75.0000 mg | ORAL_TABLET | Freq: Every day | ORAL | Status: DC
  Administered 2021-04-29 – 2021-05-01 (×3): 75 mg via ORAL
  Filled 2021-04-28 (×2): qty 1
  Filled 2021-04-28: qty 2

## 2021-04-28 MED ORDER — ACETAMINOPHEN 650 MG RE SUPP: 650.0000 mg | Freq: Four times a day (QID) | RECTAL | Status: DC | PRN

## 2021-04-28 MED ORDER — LEVOTHYROXINE SODIUM 50 MCG PO TABS
150.0000 ug | ORAL_TABLET | Freq: Every day | ORAL | Status: DC
  Administered 2021-04-29 – 2021-05-01 (×3): 150 ug via ORAL
  Filled 2021-04-28 (×2): qty 1
  Filled 2021-04-28: qty 3

## 2021-04-28 MED ORDER — METFORMIN HCL 500 MG PO TABS: 500.0000 mg | ORAL_TABLET | Freq: Two times a day (BID) | ORAL | Status: DC

## 2021-04-28 MED ORDER — INSULIN ASPART 100 UNIT/ML IJ SOLN
0.0000 [IU] | Freq: Three times a day (TID) | INTRAMUSCULAR | Status: DC
  Administered 2021-04-29: 1 [IU] via SUBCUTANEOUS

## 2021-04-28 MED ORDER — QUETIAPINE FUMARATE 25 MG PO TABS
75.0000 mg | ORAL_TABLET | Freq: Every day | ORAL | Status: DC
  Administered 2021-04-29 – 2021-04-30 (×2): 75 mg via ORAL
  Filled 2021-04-28 (×2): qty 3

## 2021-04-28 MED ORDER — SODIUM CHLORIDE 0.9 % IV SOLN: INTRAVENOUS | Status: AC

## 2021-04-28 MED ORDER — ALBUTEROL SULFATE (2.5 MG/3ML) 0.083% IN NEBU: 2.5000 mg | INHALATION_SOLUTION | Freq: Four times a day (QID) | RESPIRATORY_TRACT | Status: DC | PRN

## 2021-04-28 MED ORDER — METOPROLOL SUCCINATE ER 50 MG PO TB24
50.0000 mg | ORAL_TABLET | Freq: Every day | ORAL | Status: DC
  Administered 2021-04-29 – 2021-04-30 (×2): 50 mg via ORAL
  Filled 2021-04-28 (×2): qty 1

## 2021-04-28 MED ORDER — SODIUM CHLORIDE 0.9 % IV SOLN
1.0000 g | Freq: Once | INTRAVENOUS | Status: AC
  Administered 2021-04-28: 1 g via INTRAVENOUS
  Filled 2021-04-28: qty 10

## 2021-04-28 MED ORDER — SODIUM CHLORIDE 0.9 % IV BOLUS
500.0000 mL | Freq: Once | INTRAVENOUS | Status: AC
  Administered 2021-04-28: 500 mL via INTRAVENOUS

## 2021-04-28 MED ORDER — METOPROLOL SUCCINATE ER 50 MG PO TB24
50.0000 mg | ORAL_TABLET | Freq: Two times a day (BID) | ORAL | Status: DC
Start: 2021-04-28 — End: 2021-04-28

## 2021-04-28 MED ORDER — ENOXAPARIN SODIUM 60 MG/0.6ML IJ SOSY: 0.5000 mg/kg | PREFILLED_SYRINGE | INTRAMUSCULAR | Status: DC

## 2021-04-28 MED ORDER — ACETAMINOPHEN 325 MG PO TABS: 650.0000 mg | ORAL_TABLET | Freq: Four times a day (QID) | ORAL | Status: DC | PRN

## 2021-04-28 MED ORDER — SODIUM CHLORIDE 0.9 % IV SOLN
2.0000 g | INTRAVENOUS | Status: DC
  Administered 2021-04-29 – 2021-04-30 (×2): 2 g via INTRAVENOUS
  Filled 2021-04-28: qty 20
  Filled 2021-04-28: qty 2

## 2021-04-28 MED ORDER — ONDANSETRON HCL 4 MG/2ML IJ SOLN
4.0000 mg | Freq: Four times a day (QID) | INTRAMUSCULAR | Status: DC | PRN
Start: 2021-04-28 — End: 2021-05-01

## 2021-04-28 MED ORDER — APIXABAN 5 MG PO TABS
5.0000 mg | ORAL_TABLET | Freq: Two times a day (BID) | ORAL | Status: DC
  Administered 2021-04-29 – 2021-05-01 (×5): 5 mg via ORAL
  Filled 2021-04-28: qty 2
  Filled 2021-04-28: qty 1
  Filled 2021-04-28 (×3): qty 2

## 2021-04-28 NOTE — ED Notes (Signed)
Patient refusing blood draw/ IV access at this time; spouse not at bedside

## 2021-04-28 NOTE — H&P (Signed)
History and Physical   Darren Frazier YQM:[id number] DOB: May 22, 1941 DOA: 04/28/2021  PCP: Lynnea FerrierKlein, Bert J III, MD  Outpatient Specialists: Dr. Gilda CreaseSchnier, vascular surgeon  Patient coming from: home   I have personally briefly reviewed patient's old medical records in [facility] EMR.  Chief Concern: altered mental status   HPI: Darren Frazier is a 80 y.o. male with medical history significant for hypothyroid, insomnia, OSA, atrial fibrillation on Eliquis, non-insulin-dependent diabetes mellitus, presents to the emergency department for chief concerns of confusion and increased agitation.  At bedside patient is able to tell me his full name correctly and his age of 80.  He knows he is in the hospital.  He was not able to tell me the current calendar year after several attempts.  He states that it is 1980 and when I corrected him, he could not believe that it was 1980.  He was not able to tell me the current calendar month.  He guessed it was January.  When I ask him who the current president is, he states I do not claim him.  However after several attempts stating that this is not a political question and that I just want to assess his mental status, he was not able to tell me the name of the current president.  He participates in physical exam and can follow commands.  Per spouse over the phone, patient has been having difficulty remembering things in the last three weeks, she also endorses increased frantic and anxiety and can't get comfortable.  He has also refused to do exercises with CNA for the last 6 weeks.  Prior to 6 weeks ago he participates in exercises and wants to go outside.  He has become increasingly withdrawn and agitated.  Spouse states that they have a CNA that comes to the home in the AM, 8 AM - 12 AM daily to help with bathing and dressing patient and to transferring him to wheelchair. He is wheelchair bound and bed bound from stroke in 2019.   Spouse reports that  patient had a T max of 100.9, no home administration of medications.  Patient has developed a new cough today, dry cough for the last few days.  Patient has had poor PO intake two or three weeks. He tells his wife he needs to urinate but then he can not.   She also reports that he had blood in urine a few months ago. Spouse stopped tylenol and it got better.   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  Social history: He lives with his spouse at home, Darren Frazier. He is a former tobacco user, quit in 30 years ago. He does drink etoh now. No recreational drug use. He formerly worked [employer] and was an Art gallery managerengineer  Vaccination history: He is not vaccinated for covid 19, he had covid in March of 2020.   ROS: Unable to complete  Constitutional: no weight change, no fever ENT/Mouth: no sore throat, no rhinorrhea Eyes: no eye pain, no vision changes Cardiovascular: no chest pain, no dyspnea,  no edema, no palpitations Respiratory: no cough, no sputum, no wheezing Gastrointestinal: no nausea, no vomiting, no diarrhea, no constipation Genitourinary: no urinary incontinence, no dysuria, no hematuria Musculoskeletal: no arthralgias, no myalgias Skin: no skin lesions, no pruritus, Neuro: + weakness, no loss of consciousness, no  syncope Psych: no anxiety, no depression, + decrease appetite Heme/Lymph: no bruising, no bleeding  ED Course: Discussed with emergency medicine provider, patient requiring hospitalization for chief concerns of confusion.  Vitals in the emergency department was remarkable for temperature 99.7, respiration rate of 12, heart rate a 96, blood pressure 154/87, as PO2 of 98% on room air.  Labs in the emergency department was remarkable for sodium 130, potassium 5.0, chloride 94, bicarb 26, BUN of 23, serum creatinine of 1.91, nonfasting blood  glucose 148, GFR 35, WBC 23.3, hemoglobin 13.2, platelets 445.  Troponin was 16.  Lactic acid 1.5x2.  COVID PCR was negative. UA showed large leukocytes.  EDP gave patient 1 dose of ceftriaxone 1 g IV, sodium chloride bolus 500 mL once.  Assessment/Plan  Principal Problem:   Altered mental status Active Problems:   Chronic venous insufficiency   Essential hypertension   Atherosclerosis of native arteries of extremity with intermittent claudication (HCC)   Atrial fibrillation (HCC)   CHF (congestive heart failure) (HCC)   Hyperlipemia   Morbid obesity (HCC)   OSA (obstructive sleep apnea)   Protein-calorie malnutrition (HCC)   History of 2019 novel coronavirus disease (COVID-19)   AKI (acute kidney injury) (HCC)   # Altered mental status I suspect this is secondary to UTI - Ceftriaxone per EDP - However the progression of dementia cannot be excluded inpatient with vascular disease and history of a stroke - If patient does not improve with ceftriaxone IV, a.m. team to consider brain imaging to assess for new stroke - Check ammonia - Blood cultures x2, urine culture in the process  # Acute kidney injury-etiology work-up is in progress, differentials include multifactorial in setting of poor p.o. intake and urinary tract infection - CKD 3a at baseline, serum creatinine 1.2, GFR 58 in March 2022 - Status post sodium chloride bolus 500 mL/h per EDP - Sodium chloride 125 mL/h, 1 day ordered - BMP in the a.m.  # OSA - does not wear his cpap mask at night since stroke in 2019. - CPAP nightly ordered  # Non-insulin-dependent diabetes mellitus-metformin has not been continued due to acute kidney injury - Insulin SSI with at bedtime coverage ordered  # Atrial fibrillation-resumed home amiodarone 200 mg daily first dose 04/29/2021, metoprolol succinate twice daily, Eliquis 5 mg twice daily  # History of carotid stenosis status post right carotid enterectomy in 11/25/2020 with 10 x 8 x 40  exact stent - Continue Eliquis and aspirin  # Prolonged QT-appears to be at baseline - Previous EKG on 09/18/2018 showed QTC of 529  # Hyperlipidemia-ezetimibe 10 mg daily # Hypothyroid-levothyroxine 150 mcg daily resumed # History of urethral stricture-patient occasionally self caths in the past # Protein/calorie malnutrition # Failure to thrive - doesn't want to go outdoors  # Morbid obesity  # COVID/influenza A/influenza B PCR's were negative  Chart reviewed.   Echo in 08/28/2018 was read as ejection fraction estimated at 60 to 65%.  DVT prophylaxis: Enoxaparin, weight-based Code Status: full code   Diet: Heart healthy/carb modified Family Communication: Discussed with Darren Frazier Disposition Plan: Pending clinical course Consults called: None at this time Admission status: Observation, telemetry, MedSurg  Past Medical History:  Diagnosis Date   ARF (acute respiratory failure) (HCC)    Cardiomyopathy (HCC)    Diabetes mellitus without complication (HCC)    Dysphagia    Dysrhythmia    atrial fibrillation   Encephalopathy    GERD (gastroesophageal reflux disease)    H/O: urethral  stricture    self caths occasionally   Heart failure, unspecified (HCC)    Hemiplegia affecting right dominant side (HCC)    History of abdominal aortic aneurysm (AAA)    30/18mm in diameter, stable   History of kidney stones 2017   Hyperlipidemia    Hypertension    Hypothyroidism    Morbid obesity with BMI of 40.0-44.9, adult (HCC) 2019   Peripheral vascular disease (HCC) 2019   lymphadema both extremities, ulceration on left big toe   Sleep apnea    Stroke (HCC) 2011   x 2 within 6 months. some numbness over upper arms, balance off   Tremors of nervous system    Vitamin D deficiency    Past Surgical History:  Procedure Laterality Date   APPENDECTOMY  1961   CAROTID PTA/STENT INTERVENTION N/A 11/26/2018   Procedure: CAROTID PTA/STENT INTERVENTION;  Surgeon: Renford Dills, MD;   Location: [facility] INVASIVE CV LAB;  Service: Cardiovascular;  Laterality: N/A;   CATARACT EXTRACTION, BILATERAL Bilateral    CENTRAL LINE  08/29/2018       COLONOSCOPY     IR ANGIO INTRA EXTRACRAN SEL COM CAROTID INNOMINATE UNI R MOD SED  08/27/2018   IR ANGIO VERTEBRAL SEL SUBCLAVIAN INNOMINATE UNI L MOD SED  08/27/2018   IR CT HEAD LTD  08/27/2018   IR PERCUTANEOUS ART THROMBECTOMY/INFUSION INTRACRANIAL INC DIAG ANGIO  08/27/2018   LOWER EXTREMITY ANGIOGRAPHY Left 03/05/2018   Procedure: LOWER EXTREMITY ANGIOGRAPHY;  Surgeon: Renford Dills, MD;  Location: [facility] INVASIVE CV LAB;  Service: Cardiovascular;  Laterality: Left;   RADIOLOGY WITH ANESTHESIA N/A 08/27/2018   Procedure: IR WITH ANESTHESIA;  Surgeon: Radiologist, Medication, MD;  Location: [facility] OR;  Service: Radiology;  Laterality: N/A;   TONSILLECTOMY     Social History:  reports that he quit smoking about 38 years ago. His smoking use included cigarettes. He has a 120.00 pack-year smoking history. He has never used smokeless tobacco. He reports that he does not drink alcohol and does not use drugs.  Allergies  Allergen Reactions   Ace Inhibitors Other (See Comments)    unknown   Omeprazole Diarrhea   Statins Other (See Comments)    Abnormal lab results(pt unsure of what labs)   Family History  Problem Relation Age of Onset   Diabetes Father    Family history: Family history reviewed and not pertinent  Prior to Admission medications   Medication Sig Start Date End Date Taking? Authorizing Provider  acetaminophen (TYLENOL) 325 MG suppository Place 1-2 suppositories (325-650 mg total) rectally every 4 (four) hours as needed for mild pain (or temp >/= 101 F). 11/27/18   Stegmayer, Cala Bradford A, PA-C  acetaminophen (TYLENOL) 325 MG tablet Take 1-2 tablets (325-650 mg total) by mouth every 4 (four) hours as needed for mild pain (or temp >/= 101 F). 11/27/18   Stegmayer, Kimberly A, PA-C  albuterol (PROVENTIL) (2.5 MG/3ML) 0.083%  nebulizer solution Take 3 mLs (2.5 mg total) by nebulization every 6 (six) hours as needed for wheezing or shortness of breath. 09/16/18   Layne Benton, NP  amiodarone (PACERONE) 200 MG tablet Take 1 tablet (200 mg total) by mouth daily. 09/17/18   Layne Benton, NP  apixaban (ELIQUIS) 5 MG TABS tablet Take 1 tablet (5 mg total) by mouth 2 (two) times daily. 09/16/18   Layne Benton, NP  benzonatate (TESSALON) 100 MG capsule Take 100 mg by mouth 3 (three) times daily.    [provider]  Cholecalciferol (VITAMIN D3) 1000 units CAPS Take 1,000 Units by mouth daily.     [provider]  clopidogrel (PLAVIX) 75 MG tablet Take 1 tablet (75 mg total) by mouth daily. Patient not taking: Reported on 04/21/2020 11/27/18   Stegmayer, Cala Bradford A, PA-C  docusate sodium (COLACE) 100 MG capsule Take by mouth. 01/07/19   [provider]  ezetimibe (ZETIA) 10 MG tablet Take 1 tablet (10 mg total) by mouth daily. 09/17/18   Layne Benton, NP  fluticasone (FLONASE) 50 MCG/ACT nasal spray Place 2 sprays into both nostrils daily as needed for allergies or rhinitis.     [provider]  levothyroxine (SYNTHROID, LEVOTHROID) 100 MCG tablet Take 1 tablet (100 mcg total) by mouth daily before breakfast. 09/30/18   Hongalgi, Maximino Greenland, MD  metoprolol tartrate (LOPRESSOR) 50 MG tablet Take 50 mg by mouth 2 (two) times daily.    [provider]  Multiple Vitamin (MULTIVITAMIN WITH MINERALS) TABS tablet Take 1 tablet by mouth daily. 10/01/18   Hongalgi, Maximino Greenland, MD  Omega-3 Fatty Acids (FISH OIL) 1000 MG CAPS Take 1,000 mg by mouth 3 (three) times daily.    [provider]  QUEtiapine (SEROQUEL) 25 MG tablet Take 25 mg by mouth See admin instructions. Take with 50 mg to equal 75 mg at bedtime    [provider]  QUEtiapine (SEROQUEL) 50 MG tablet Take 1.5 tablets (75 mg total) by mouth at bedtime. Patient taking differently: Take 50 mg by mouth See admin instructions.  Take with 25 mg to equal 75 mg at bedtime 09/30/18   Hongalgi, Maximino Greenland, MD  senna-docusate (SENOKOT-S) 8.6-50 MG tablet Take 1 tablet by mouth at bedtime as needed for mild constipation. 09/16/18   Layne Benton, NP  vitamin C (ASCORBIC ACID) 500 MG tablet Take 500 mg by mouth daily.    [provider]   Physical Exam: Vitals:   04/28/21 2100 04/28/21 2200 04/28/21 2230 04/28/21 2300  BP: 136/80 (!) 141/88 (!) 158/66 (!) 156/75  Pulse: 90 93 89 89  Resp:  (!) 21 (!) 28   Temp:      TempSrc:      SpO2: 98% 94% 99% 99%  Weight:      Height:       Constitutional: appears age-appropriate, NAD, calm, comfortable Eyes: PERRL, lids and conjunctivae normal ENMT: Mucous membranes are moist. Posterior pharynx clear of any exudate or lesions. Age-appropriate dentition. Hearing appropriate Neck: normal, supple, no masses, no thyromegaly Respiratory: clear to auscultation bilaterally, no wheezing, no crackles. Normal respiratory effort. No accessory muscle use.  Cardiovascular: Regular rate and rhythm, no murmurs / rubs / gallops. No extremity edema. 2+ pedal pulses. No carotid bruits.  Abdomen: Obese abdomen no tenderness, no masses palpated, no hepatosplenomegaly. Bowel sounds positive.  Musculoskeletal: no clubbing / cyanosis. No joint deformity upper and lower extremities. Good ROM, no contractures, no atrophy. Normal muscle tone.  Skin: no rashes, lesions, ulcers. No induration Neurologic: Sensation intact. Strength 5/5 in all 4.  Psychiatric: Normal judgment and insight. Alert and oriented x 3. Normal mood.   EKG: independently reviewed, showing atrial fibrillation with rate of 96, QTc 531  Chest x-ray on Admission: I personally reviewed and I agree with radiologist reading as below.  DG Chest Portable 1 View  Result Date: 04/28/2021 CLINICAL DATA:  Altered level of consciousness, fever EXAM: PORTABLE CHEST 1 VIEW COMPARISON:  09/19/2018 FINDINGS: Single frontal view of the chest  demonstrates a stable cardiac  silhouette. There is mild central vascular congestion without airspace disease, effusion, or pneumothorax. No acute bony abnormalities. IMPRESSION: 1. Mild chronic central vascular congestion. No acute airspace disease. Electronically Signed   By: Sharlet Salina M.D.   On: 04/28/2021 20:11    Labs on Admission: I have personally reviewed following labs  CBC: Recent Labs  Lab 04/28/21 2056  WBC 23.3*  NEUTROABS 19.0*  HGB 13.2  HCT 37.7*  MCV 93.8  PLT 445*   Basic Metabolic Panel: Recent Labs  Lab 04/28/21 2056  NA 130*  K 5.0  CL 94*  CO2 26  GLUCOSE 148*  BUN 23  CREATININE 1.91*  CALCIUM 9.4   GFR: Estimated Creatinine Clearance: 38.9 mL/min (A) (by C-G formula based on SCr of 1.91 mg/dL (H)).  Liver Function Tests: Recent Labs  Lab 04/28/21 2056  AST 20  ALT 12  ALKPHOS 86  BILITOT 0.9  PROT 8.0  ALBUMIN 3.2*   Urine analysis:    Component Value Date/Time   COLORURINE YELLOW (A) 04/28/2021 1950   APPEARANCEUR CLOUDY (A) 04/28/2021 1950   APPEARANCEUR Clear 03/07/2013 1849   LABSPEC 1.015 04/28/2021 1950   LABSPEC 1.011 03/07/2013 1849   PHURINE 5.0 04/28/2021 1950   GLUCOSEU NEGATIVE 04/28/2021 1950   GLUCOSEU Negative 03/07/2013 1849   HGBUR MODERATE (A) 04/28/2021 1950   BILIRUBINUR NEGATIVE 04/28/2021 1950   BILIRUBINUR Negative 03/07/2013 1849   KETONESUR NEGATIVE 04/28/2021 1950   PROTEINUR 30 (A) 04/28/2021 1950   NITRITE NEGATIVE 04/28/2021 1950   LEUKOCYTESUR LARGE (A) 04/28/2021 1950   LEUKOCYTESUR 2+ 03/07/2013 1849   Dr. Sedalia Muta [facility]  If 7PM-7AM, please contact overnight-coverage provider If 7AM-7PM, please contact day coverage provider [email or web address]  04/28/2021, 11:10 PM

## 2021-04-28 NOTE — Progress Notes (Signed)
PHARMACIST - PHYSICIAN COMMUNICATION  CONCERNING:  Enoxaparin (Lovenox) for DVT Prophylaxis    RECOMMENDATION: Patient was prescribed enoxaprin 40mg  q24 hours for VTE prophylaxis.   Filed Weights   04/28/21 1900  Weight: 117 kg (257 lb 15 oz)    Body mass index is 38.09 kg/m.  Estimated Creatinine Clearance: 38.9 mL/min (A) (by C-G formula based on SCr of 1.91 mg/dL (H)).   Based on [facility] policy patient is candidate for enoxaparin 0.5mg /kg TBW SQ every 24 hours based on BMI being >30.  Patient is candidate for enoxaparin 30mg  every 24 hours based on CrCl <23ml/min or Weight <45kg  DESCRIPTION: Pharmacy has adjusted enoxaparin dose per [facility] policy.  Patient is now receiving enoxaparin 0.5 mg/kg every 24 hours   31m, PharmD, [facility] 04/28/2021 10:55 PM

## 2021-04-28 NOTE — ED Notes (Signed)
MD Siadecki, S. Notified face to face of patient's refusal to obtain blood work and IV access at this time

## 2021-04-28 NOTE — ED Provider Notes (Signed)
[facility] Emergency Department Provider Note ____________________________________________   Event Date/Time   First MD Initiated Contact with Patient 04/28/21 1905     (approximate)  I have reviewed the triage vital signs and the nursing notes.   HISTORY  Chief Complaint Dysuria  Level 5 caveat: History of present illness limited due to altered mental status  HPI Darren Frazier is a 80 y.o. male with PMH as noted below who presents with dysuria, fever, and altered mental status.  The patient himself is unable to give any history and states he is here because his wife made him come to the hospital.  The wife is currently not present.  I was able to obtain additional history from recent PMD notes.  The patient himself denies pain, difficulty urinating, or weakness.  He states he needs "air" but denies shortness of breath.  Past Medical History:  Diagnosis Date   ARF (acute respiratory failure) (HCC)    Cardiomyopathy (HCC)    Diabetes mellitus without complication (HCC)    Dysphagia    Dysrhythmia    atrial fibrillation   Encephalopathy    GERD (gastroesophageal reflux disease)    H/O: urethral stricture    self caths occasionally   Heart failure, unspecified (HCC)    Hemiplegia affecting right dominant side (HCC)    History of abdominal aortic aneurysm (AAA)    30/17mm in diameter, stable   History of kidney stones 2017   Hyperlipidemia    Hypertension    Hypothyroidism    Morbid obesity with BMI of 40.0-44.9, adult (HCC) 2019   Peripheral vascular disease (HCC) 2019   lymphadema both extremities, ulceration on left big toe   Sleep apnea    Stroke (HCC) 2011   x 2 within 6 months. 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) 04/21/2020   Psoriasis 04/21/2020   Renal cyst, left 04/21/2020   History of 2019 novel coronavirus disease (COVID-19) 01/22/2020   Carotid stenosis, symptomatic, with infarction (HCC) 11/26/2018   Carotid artery stenosis, symptomatic, right 10/22/2018   Sinus pause 09/16/2018   Acute respiratory failure (HCC) 09/15/2018   Atrial fibrillation (HCC) 09/15/2018   CHF (congestive heart failure) (HCC) 09/15/2018   Femoral artery pseudo-aneurysm, 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   Hyperlipemia 09/15/2018   Morbid obesity (HCC) 09/15/2018   OSA (obstructive sleep apnea) 09/15/2018   Urethral stricture in male 09/15/2018   UTI (urinary tract infection) 09/15/2018   Protein-calorie malnutrition (HCC) 09/15/2018   Right hemiparesis (HCC) 08/27/2018   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) 08/27/2018   Essential hypertension 02/20/2018   Diabetes (HCC) 02/20/2018   Atherosclerosis of native arteries of extremity with intermittent claudication (HCC) 02/20/2018   Chronic venous insufficiency 03/31/2017   Leg pain 03/31/2017   Hydronephrosis with urinary obstruction due to ureteral calculus 08/13/2016    Past Surgical History:  Procedure Laterality Date   APPENDECTOMY  1961   CAROTID PTA/STENT INTERVENTION N/A 11/26/2018   Procedure: CAROTID PTA/STENT INTERVENTION;  Surgeon: Renford Dills, MD;  Location: [facility] INVASIVE CV LAB;  Service: Cardiovascular;  Laterality: N/A;   CATARACT EXTRACTION, BILATERAL Bilateral    CENTRAL LINE  08/29/2018  COLONOSCOPY     IR ANGIO INTRA EXTRACRAN SEL COM CAROTID INNOMINATE UNI R MOD SED  08/27/2018   IR ANGIO VERTEBRAL SEL SUBCLAVIAN INNOMINATE UNI L MOD SED  08/27/2018   IR CT HEAD LTD  08/27/2018   IR PERCUTANEOUS ART THROMBECTOMY/INFUSION INTRACRANIAL INC DIAG ANGIO  08/27/2018   LOWER EXTREMITY ANGIOGRAPHY Left 03/05/2018   Procedure: LOWER EXTREMITY ANGIOGRAPHY;  Surgeon: Renford Dills, MD;  Location: [facility] INVASIVE CV LAB;  Service: Cardiovascular;  Laterality: Left;   RADIOLOGY WITH ANESTHESIA N/A 08/27/2018   Procedure: IR WITH ANESTHESIA;  Surgeon: Radiologist, Medication, MD;  Location: [facility] OR;  Service: Radiology;  Laterality: N/A;   TONSILLECTOMY      Prior to Admission medications   Medication Sig Start Date End Date Taking? Authorizing Provider  albuterol (PROVENTIL) (2.5 MG/3ML) 0.083% nebulizer solution Take 3 mLs (2.5 mg total) by nebulization every 6 (six) hours as needed for wheezing or shortness of breath. 09/16/18  Yes Layne Benton, NP  amiodarone (PACERONE) 200 MG tablet Take 1 tablet (200 mg total) by mouth daily. 09/17/18  Yes Layne Benton, NP  apixaban (ELIQUIS) 5 MG TABS tablet Take 1 tablet (5 mg total) by mouth 2 (two) times daily. 09/16/18  Yes Layne Benton, NP  Cholecalciferol (VITAMIN D3) 1000 units CAPS Take 1,000 Units by mouth daily.    Yes [provider]  ezetimibe (ZETIA) 10 MG tablet Take 1 tablet (10 mg total) by mouth daily. 09/17/18  Yes Layne Benton, NP  levothyroxine (SYNTHROID) 150 MCG tablet Take 150 mcg by mouth daily. 04/28/21  Yes [provider]  metFORMIN (GLUCOPHAGE) 500 MG tablet Take 500 mg by mouth 2 (two) times daily. 03/02/21  Yes [provider]  metoprolol succinate (TOPROL-XL) 50 MG 24 hr tablet Take 50-75 mg by mouth 2 (two) times daily. 75 mg qam 50 qhs 04/28/21  Yes [provider]  Multiple Vitamin (MULTIVITAMIN WITH MINERALS) TABS tablet Take 1 tablet by mouth daily. 10/01/18  Yes Hongalgi, Maximino Greenland, MD  Omega-3 Fatty Acids (FISH OIL) 1000 MG CAPS Take 1,000 mg by mouth 3 (three) times daily.   Yes [provider]  QUEtiapine (SEROQUEL) 25 MG tablet Take 25 mg by mouth See admin instructions. Take with 50 mg to equal 75 mg at bedtime   Yes [provider]  QUEtiapine (SEROQUEL) 50 MG tablet Take 1.5 tablets (75 mg total) by mouth at bedtime. Patient taking  differently: Take 50 mg by mouth See admin instructions. Take with 25 mg to equal 75 mg at bedtime 09/30/18  Yes Hongalgi, Maximino Greenland, MD  senna-docusate (SENOKOT-S) 8.6-50 MG tablet Take 1 tablet by mouth at bedtime as needed for mild constipation. 09/16/18  Yes Layne Benton, NP  vitamin C (ASCORBIC ACID) 500 MG tablet Take 500 mg by mouth daily.   Yes [provider]  amoxicillin (AMOXIL) 875 MG tablet Take 875 mg by mouth 2 (two) times daily. Patient not taking: No sig reported 04/17/21   [provider]  ketoconazole (NIZORAL) 2 % cream Apply topically 2 (two) times daily as needed. 04/17/21   [provider]    Allergies Ace inhibitors, Omeprazole, and Statins  Family History  Problem Relation Age of Onset   Diabetes Father     Social History Social History   Tobacco Use   Smoking status: Former    Packs/day: 4.00    Years: 30.00    Pack years: 120.00    Types:  Cigarettes    Quit date: 68    Years since quitting: 38.6   Smokeless tobacco: Never  Vaping Use   Vaping Use: Never used  Substance Use Topics   Alcohol use: No    Comment: occasional beer. couple years ago   Drug use: No    Review of Systems Level 5 caveat: Unable to obtain review of systems due to altered mental status    ____________________________________________   PHYSICAL EXAM:  VITAL SIGNS: ED Triage Vitals [04/28/21 1900]  Enc Vitals Group     BP (!) 154/87     Pulse Rate 96     Resp 12     Temp 99.7 F (37.6 C)     Temp Source Oral     SpO2 98 %     Weight 257 lb 15 oz (117 kg)     Height 5\' 9"  (1.753 m)     Head Circumference      Peak Flow      Pain Score 0     Pain Loc      Pain Edu?      Excl. in GC?     Constitutional: Alert, oriented x2.  Relatively well appearing and in no acute distress. Eyes: Conjunctivae are normal.  Head: Atraumatic. Nose: No congestion/rhinnorhea. Mouth/Throat: Mucous membranes are dry. Neck: Normal range of motion.   Cardiovascular: Normal rate, regular rhythm. Grossly normal heart sounds.  Good peripheral circulation. Respiratory: Normal respiratory effort.  No retractions. Lungs CTAB. Gastrointestinal: Soft and nontender. No distention.  Genitourinary: No flank tenderness. Musculoskeletal: No lower extremity edema.  Extremities warm and well perfused.  Neurologic:  Normal speech and language. No gross focal neurologic deficits are appreciated.  Skin:  Skin is warm and dry. No rash noted. Psychiatric: Slightly agitated and anxious but redirectable.  ____________________________________________   LABS (all labs ordered are listed, but only abnormal results are displayed)  Labs Reviewed  COMPREHENSIVE METABOLIC PANEL - Abnormal; Notable for the following components:      Result Value   Sodium 130 (*)    Chloride 94 (*)    Glucose, Bld 148 (*)    Creatinine, Ser 1.91 (*)    Albumin 3.2 (*)    GFR, Estimated 35 (*)    All other components within normal limits  CBC WITH DIFFERENTIAL/PLATELET - Abnormal; Notable for the following components:   WBC 23.3 (*)    RBC 4.02 (*)    HCT 37.7 (*)    Platelets 445 (*)    Neutro Abs 19.0 (*)    Monocytes Absolute 2.5 (*)    Abs Immature Granulocytes 0.18 (*)    All other components within normal limits  URINALYSIS, COMPLETE (UACMP) WITH MICROSCOPIC - Abnormal; Notable for the following components:   Color, Urine YELLOW (*)    APPearance CLOUDY (*)    Hgb urine dipstick MODERATE (*)    Protein, ur 30 (*)    Leukocytes,Ua LARGE (*)    WBC, UA >50 (*)    Bacteria, UA RARE (*)    All other components within normal limits  AMMONIA - Abnormal; Notable for the following components:   Ammonia <9 (*)    All other components within normal limits  RESP PANEL BY RT-PCR (FLU A&B, COVID) ARPGX2  URINE CULTURE  CULTURE, BLOOD (ROUTINE X 2)  CULTURE, BLOOD (ROUTINE X 2)  LACTIC ACID, PLASMA  LACTIC ACID, PLASMA  BASIC METABOLIC PANEL  CBC  VITAMIN B12   VITAMIN D 25 HYDROXY (VIT  D DEFICIENCY, FRACTURES)  VITAMIN B1  HEMOGLOBIN A1C  TROPONIN I (HIGH SENSITIVITY)  TROPONIN I (HIGH SENSITIVITY)   ____________________________________________  EKG  ED ECG REPORT I, Dionne Bucy, the attending physician, personally viewed and interpreted this ECG.  Date: 04/28/2021 EKG Time: 1902 Rate: 96 Rhythm: Atrial fibrillation QRS Axis: normal Intervals: Nonspecific IVCD ST/T Wave abnormalities: Nonspecific repolarization abnormality Narrative Interpretation: Nonspecific abnormalities with no evidence of acute ischemia; no significant change when compared EKG of 09/17/2018  ____________________________________________  RADIOLOGY  Chest x-ray interpreted by me shows no focal consolidation or edema  ____________________________________________   PROCEDURES  Procedure(s) performed: No  Procedures  Critical Care performed: No ____________________________________________   INITIAL IMPRESSION / ASSESSMENT AND PLAN / ED COURSE  Pertinent labs & imaging results that were available during my care of the patient were reviewed by me and considered in my medical decision making (see chart for details).   80 year old male with PMH as noted above including diabetes, hypertension, atrial fibrillation, CHF presents with fever, dysuria, and altered mental status.  The patient himself is unable to give any significant history and there are no family members currently present.   I reviewed the past medical records in Epic and Care Everywhere.  The patient's wife contacted his PMD Dr. Graciela Husbands today, and the telephone conversation notes indicate that the patient has developed agitation and altered mental status over the last 1 to 2 weeks, had a fever today to 100.9, decreased appetite, and still has a cloudy urine after just finishing a course of Augmentin.  On exam the patient is alert and appears mildly confused.  He is somewhat agitated, asking  for "air" but denying being short of breath, however he is redirectable.  Neurologic exam is nonfocal.  He is mildly hypertensive with a borderline elevated temperature and otherwise normal vital signs.  The physical exam is otherwise unremarkable.  Differential is broad but includes recurrent UTI, COVID-19 or other infection, dehydration, AKI, other metabolic etiology, or less likely primary cardiac cause.  ----------------------------------------- 11:54 PM on 04/28/2021 -----------------------------------------   Workup reveals findings consistent with UTI.  I have ordered IV ceftriaxone.  I consulted Dr. Sedalia Muta from the hospitalist service for admission.     ____________________________________________   FINAL CLINICAL IMPRESSION(S) / ED DIAGNOSES  Final diagnoses:  None      NEW MEDICATIONS STARTED DURING THIS VISIT:  New Prescriptions   No medications on file     Note:  This document was prepared using Dragon voice recognition software and may include unintentional dictation errors.    Dionne Bucy, MD 04/28/21 [phone]

## 2021-04-28 NOTE — ED Triage Notes (Signed)
Patient presents to ER from home. Patients wife sent patient to ER for possible UTI. Patient finished antibiotics 4 days ago for UTI, continues to have some cloudiness. Patient A&Ox3.

## 2021-04-29 ENCOUNTER — Encounter: Payer: Self-pay | Admitting: Internal Medicine

## 2021-04-29 ENCOUNTER — Other Ambulatory Visit: Payer: Self-pay

## 2021-04-29 DIAGNOSIS — E039 Hypothyroidism, unspecified: Secondary | ICD-10-CM | POA: Diagnosis present

## 2021-04-29 DIAGNOSIS — G4700 Insomnia, unspecified: Secondary | ICD-10-CM | POA: Diagnosis present

## 2021-04-29 DIAGNOSIS — Z6838 Body mass index (BMI) 38.0-38.9, adult: Secondary | ICD-10-CM | POA: Diagnosis not present

## 2021-04-29 DIAGNOSIS — N1831 Chronic kidney disease, stage 3a: Secondary | ICD-10-CM | POA: Diagnosis present

## 2021-04-29 DIAGNOSIS — E785 Hyperlipidemia, unspecified: Secondary | ICD-10-CM | POA: Diagnosis present

## 2021-04-29 DIAGNOSIS — L89150 Pressure ulcer of sacral region, unstageable: Secondary | ICD-10-CM | POA: Diagnosis not present

## 2021-04-29 DIAGNOSIS — Z888 Allergy status to other drugs, medicaments and biological substances status: Secondary | ICD-10-CM | POA: Diagnosis not present

## 2021-04-29 DIAGNOSIS — I429 Cardiomyopathy, unspecified: Secondary | ICD-10-CM | POA: Diagnosis present

## 2021-04-29 DIAGNOSIS — G4733 Obstructive sleep apnea (adult) (pediatric): Secondary | ICD-10-CM | POA: Diagnosis present

## 2021-04-29 DIAGNOSIS — N179 Acute kidney failure, unspecified: Secondary | ICD-10-CM | POA: Diagnosis present

## 2021-04-29 DIAGNOSIS — I714 Abdominal aortic aneurysm, without rupture: Secondary | ICD-10-CM | POA: Diagnosis present

## 2021-04-29 DIAGNOSIS — I4891 Unspecified atrial fibrillation: Secondary | ICD-10-CM | POA: Diagnosis present

## 2021-04-29 DIAGNOSIS — G8191 Hemiplegia, unspecified affecting right dominant side: Secondary | ICD-10-CM | POA: Diagnosis present

## 2021-04-29 DIAGNOSIS — G9341 Metabolic encephalopathy: Secondary | ICD-10-CM | POA: Diagnosis present

## 2021-04-29 DIAGNOSIS — L89302 Pressure ulcer of unspecified buttock, stage 2: Secondary | ICD-10-CM | POA: Diagnosis present

## 2021-04-29 DIAGNOSIS — N390 Urinary tract infection, site not specified: Secondary | ICD-10-CM | POA: Diagnosis present

## 2021-04-29 DIAGNOSIS — I129 Hypertensive chronic kidney disease with stage 1 through stage 4 chronic kidney disease, or unspecified chronic kidney disease: Secondary | ICD-10-CM | POA: Diagnosis present

## 2021-04-29 DIAGNOSIS — R4182 Altered mental status, unspecified: Secondary | ICD-10-CM | POA: Diagnosis present

## 2021-04-29 DIAGNOSIS — Z20822 Contact with and (suspected) exposure to covid-19: Secondary | ICD-10-CM | POA: Diagnosis present

## 2021-04-29 DIAGNOSIS — I4821 Permanent atrial fibrillation: Secondary | ICD-10-CM | POA: Diagnosis not present

## 2021-04-29 DIAGNOSIS — F419 Anxiety disorder, unspecified: Secondary | ICD-10-CM | POA: Diagnosis present

## 2021-04-29 DIAGNOSIS — E1151 Type 2 diabetes mellitus with diabetic peripheral angiopathy without gangrene: Secondary | ICD-10-CM | POA: Diagnosis present

## 2021-04-29 DIAGNOSIS — Z8616 Personal history of COVID-19: Secondary | ICD-10-CM | POA: Diagnosis not present

## 2021-04-29 DIAGNOSIS — E1122 Type 2 diabetes mellitus with diabetic chronic kidney disease: Secondary | ICD-10-CM | POA: Diagnosis present

## 2021-04-29 DIAGNOSIS — R627 Adult failure to thrive: Secondary | ICD-10-CM | POA: Diagnosis present

## 2021-04-29 DIAGNOSIS — E46 Unspecified protein-calorie malnutrition: Secondary | ICD-10-CM | POA: Diagnosis present

## 2021-04-29 LAB — CBG MONITORING, ED: Glucose-Capillary: 127 mg/dL — ABNORMAL HIGH (ref 70–99)

## 2021-04-29 LAB — CBC
HCT: 38.5 % — ABNORMAL LOW (ref 39.0–52.0)
Hemoglobin: 13 g/dL (ref 13.0–17.0)
MCH: 31 pg (ref 26.0–34.0)
MCHC: 33.8 g/dL (ref 30.0–36.0)
MCV: 91.9 fL (ref 80.0–100.0)
Platelets: 463 10*3/uL — ABNORMAL HIGH (ref 150–400)
RBC: 4.19 MIL/uL — ABNORMAL LOW (ref 4.22–5.81)
RDW: 14.1 % (ref 11.5–15.5)
WBC: 29.8 10*3/uL — ABNORMAL HIGH (ref 4.0–10.5)
nRBC: 0 % (ref 0.0–0.2)

## 2021-04-29 LAB — VITAMIN B12: Vitamin B-12: 317 pg/mL (ref 180–914)

## 2021-04-29 LAB — BASIC METABOLIC PANEL
Anion gap: 13 (ref 5–15)
BUN: 22 mg/dL (ref 8–23)
CO2: 26 mmol/L (ref 22–32)
Calcium: 9.3 mg/dL (ref 8.9–10.3)
Chloride: 94 mmol/L — ABNORMAL LOW (ref 98–111)
Creatinine, Ser: 1.93 mg/dL — ABNORMAL HIGH (ref 0.61–1.24)
GFR, Estimated: 35 mL/min — ABNORMAL LOW (ref >60)
Glucose, Bld: 135 mg/dL — ABNORMAL HIGH (ref 70–99)
Potassium: 4.6 mmol/L (ref 3.5–5.1)
Sodium: 133 mmol/L — ABNORMAL LOW (ref 135–145)

## 2021-04-29 LAB — HEMOGLOBIN A1C
Hgb A1c MFr Bld: 6.3 % — ABNORMAL HIGH (ref 4.8–5.6)
Mean Plasma Glucose: 134.11 mg/dL

## 2021-04-29 LAB — GLUCOSE, CAPILLARY: Glucose-Capillary: 152 mg/dL — ABNORMAL HIGH (ref 70–99)

## 2021-04-29 LAB — VITAMIN D 25 HYDROXY (VIT D DEFICIENCY, FRACTURES): Vit D, 25-Hydroxy: 65.06 ng/mL (ref 30–100)

## 2021-04-29 NOTE — ED Notes (Signed)
Changed pts brief, offer pt breakfast , pt refused, offered pt  Blanket pt refused , pts wife donna arrived all questions answered

## 2021-04-29 NOTE — ED Notes (Signed)
Phlebotomy called to collect AM labs

## 2021-04-29 NOTE — Progress Notes (Signed)
[facility]  - [place] [facility]   PATIENT NAME: Darren Frazier    MR#:  [id number]  DATE OF BIRTH:  05-05-1941  SUBJECTIVE:  history was obtained from wife and son. Patient is at home bedbound not his usual self and brought in with metrology weakness and poor PO intake. Found WTI.  during my evaluation being fed salad and carrots patient eating well awake and alert.  REVIEW OF SYSTEMS:   Review of Systems  Constitutional:  Positive for malaise/fatigue. Negative for chills, fever and weight loss.  HENT:  Negative for ear discharge, ear pain and nosebleeds.   Eyes:  Negative for blurred vision, pain and discharge.  Respiratory:  Negative for sputum production, shortness of breath, wheezing and stridor.   Cardiovascular:  Negative for chest pain, palpitations, orthopnea and PND.  Gastrointestinal:  Negative for abdominal pain, diarrhea, nausea and vomiting.  Genitourinary:  Negative for frequency and urgency.  Musculoskeletal:  Negative for back pain and joint pain.  Neurological:  Positive for weakness. Negative for sensory change, speech change and focal weakness.  Psychiatric/Behavioral:  Negative for depression and hallucinations. The patient is not nervous/anxious.   Tolerating Diet:yes Tolerating PT: bed bound  DRUG ALLERGIES:   Allergies  Allergen Reactions   Ace Inhibitors Other (See Comments)    unknown   Omeprazole Diarrhea   Statins Other (See Comments)    Abnormal lab results(pt unsure of what labs)    VITALS:  Blood pressure 131/70, pulse 94, temperature 99.1 F (37.3 C), temperature source Oral, resp. rate (!) 23, height 5\' 9"  (1.753 m), weight 117 kg, SpO2 94 %.  PHYSICAL EXAMINATION:   Physical Exam  GENERAL:  80 y.o.-year-old patient lying in the bed with no acute distress.  Chronically ill LUNGS: Normal breath sounds bilaterally, no wheezing, rales, rhonchi. No use of accessory muscles of respiration.  CARDIOVASCULAR: S1, S2  normal. No murmurs, rubs, or gallops.  ABDOMEN: Soft, nontender, nondistended. Bowel sounds present. No organomegaly or mass.  EXTREMITIES: No cyanosis, clubbing or edema b/l.    NEUROLOGIC: right side weakness--chronic PSYCHIATRIC:  patient is alert and awake SKIN: No obvious rash, lesion, or ulcer.   LABORATORY PANEL:  CBC Recent Labs  Lab 04/29/21 0700  WBC 29.8*  HGB 13.0  HCT 38.5*  PLT 463*    Chemistries  Recent Labs  Lab 04/28/21 2056 04/29/21 0700  NA 130* 133*  K 5.0 4.6  CL 94* 94*  CO2 26 26  GLUCOSE 148* 135*  BUN 23 22  CREATININE 1.91* 1.93*  CALCIUM 9.4 9.3  AST 20  --   ALT 12  --   ALKPHOS 86  --   BILITOT 0.9  --    Cardiac Enzymes No results for input(s): TROPONINI in the last 168 hours. RADIOLOGY:  DG Chest Portable 1 View  Result Date: 04/28/2021 CLINICAL DATA:  Altered level of consciousness, fever EXAM: PORTABLE CHEST 1 VIEW COMPARISON:  09/19/2018 FINDINGS: Single frontal view of the chest demonstrates a stable cardiac silhouette. There is mild central vascular congestion without airspace disease, effusion, or pneumothorax. No acute bony abnormalities. IMPRESSION: 1. Mild chronic central vascular congestion. No acute airspace disease. Electronically Signed   By: 09/21/2018 M.D.   On: 04/28/2021 20:11   ASSESSMENT AND PLAN:  Darren Frazier is a 80 y.o. male with medical history significant for hypothyroid, insomnia, OSA, atrial fibrillation on Eliquis, non-insulin-dependent diabetes mellitus, presents to the emergency department for chief concerns of confusion and increased  agitation.   Metabolic encephalopathy suspected due to UTI -- patient has had a history of urethral structure and used to do In-N-Out. 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 a -- continue IV  fluids -- monitor input-output  obstructive sleep apnea -- noncompliant CPAP  diabetes type II with renal manifestation -- sliding scale insulin  history of a fib with RVR -- continue amiodarone, metoprolol and eliquis  Hyperlipidemia -- continue Zetia  Hypothyroidism continue Synthroid  Procedures: Family communication : son and wife in the ER Consults : CODE STATUS: full DVT Prophylaxis : eliquis Level of care: Med-Surg Status is: Inpatient  Remains inpatient appropriate because:Inpatient level of care appropriate due to severity of illness  Dispo: The patient is from: Home              Anticipated d/c is to: Home              Patient currently is not medically stable to d/c.   Difficult to place patient No        TOTAL TIME TAKING CARE OF THIS PATIENT: 25 minutes.  >50% time spent on counselling and coordination of care  Note: This dictation was prepared with Dragon dictation along with smaller phrase technology. Any transcriptional errors that result from this process are unintentional.  Enedina Finner M.D    [facility]   CC: Primary care physician; Lynnea Ferrier, MD Patient ID: Darren Frazier, male   DOB: 03/08/1941, 80 y.o.   MRN: [id number]

## 2021-04-30 DIAGNOSIS — L8990 Pressure ulcer of unspecified site, unspecified stage: Secondary | ICD-10-CM | POA: Insufficient documentation

## 2021-04-30 DIAGNOSIS — R4182 Altered mental status, unspecified: Secondary | ICD-10-CM | POA: Diagnosis not present

## 2021-04-30 LAB — GLUCOSE, CAPILLARY
Glucose-Capillary: 130 mg/dL — ABNORMAL HIGH (ref 70–99)
Glucose-Capillary: 122 mg/dL — ABNORMAL HIGH (ref 70–99)
Glucose-Capillary: 127 mg/dL — ABNORMAL HIGH (ref 70–99)
Glucose-Capillary: 118 mg/dL — ABNORMAL HIGH (ref 70–99)

## 2021-04-30 LAB — URINE CULTURE: Culture: NO GROWTH

## 2021-04-30 NOTE — Progress Notes (Signed)
Pt refused to let me measure wound on buttocks. He also refused wound care, and prevalon boots. Pt educated on need for all of these things to prevent wound from worsening and possibly becoming sicker. Pt stated "I wasn't sick until I came in here. Everything yall do make me sicker and sicker." Still refused all wound measures. Will make MD aware.

## 2021-04-30 NOTE — Consult Note (Signed)
WOC Nurse Consult Note: Reason for Consult:Stage 2 partial thickness pressure injury to buttocks. Wound type:pressure Pressure Injury POA: Yes Measurement:To be obtained by bedside RN and documented on Nursing Flow Sheet today Wound bed:Red, moist Drainage (amount, consistency, odor) scant serous Periwound:intact Dressing procedure/placement/frequency: I have provide Nursing with guidance from the skin care order set for stage 2 pressure injury using a xeroform gauze as a wound contact layer and topping with a silicone foam bordered dressing. Patient is to be turned from side to side and time in the supine position minimized. While in bed, Prevalon heel boots are to be in use to prevent pressure injury to heels.  WOC nursing team will not follow, but will remain available to this patient, the nursing and medical teams.  Please re-consult if needed. Thanks, Ladona Mow, MSN, RN, GNP, [facility]  Pager# [phone]

## 2021-04-30 NOTE — Progress Notes (Signed)
[facility]  - [place] at [facility]   PATIENT NAME: Darren Frazier    MR#:  [id number]  DATE OF BIRTH:  April 21, 1941  SUBJECTIVE:  history was obtained from wife and son. Patient is at home bedbound not his usual self and brought in with metrology weakness and poor PO intake. Found uTI.  Awake and alert. Wants to go home. No family in the room REVIEW OF SYSTEMS:   Review of Systems  Constitutional:  Positive for malaise/fatigue. Negative for chills, fever and weight loss.  HENT:  Negative for ear discharge, ear pain and nosebleeds.   Eyes:  Negative for blurred vision, pain and discharge.  Respiratory:  Negative for sputum production, shortness of breath, wheezing and stridor.   Cardiovascular:  Negative for chest pain, palpitations, orthopnea and PND.  Gastrointestinal:  Negative for abdominal pain, diarrhea, nausea and vomiting.  Genitourinary:  Negative for frequency and urgency.  Musculoskeletal:  Negative for back pain and joint pain.  Neurological:  Positive for weakness. Negative for sensory change, speech change and focal weakness.  Psychiatric/Behavioral:  Negative for depression and hallucinations. The patient is not nervous/anxious.   Tolerating Diet:yes Tolerating PT: bed bound  DRUG ALLERGIES:   Allergies  Allergen Reactions   Ace Inhibitors Other (See Comments)    unknown   Omeprazole Diarrhea   Statins Other (See Comments)    Abnormal lab results(pt unsure of what labs)    VITALS:  Blood pressure (!) 148/79, pulse 100, temperature 98.2 F (36.8 C), resp. rate 16, height 5\' 9"  (1.753 m), weight 117 kg, SpO2 100 %.  PHYSICAL EXAMINATION:   Physical Exam  GENERAL:  80 y.o.-year-old patient lying in the bed with no acute distress.  Chronically ill LUNGS: Normal breath sounds bilaterally, no wheezing, rales, rhonchi. No use of accessory muscles of respiration.  CARDIOVASCULAR: S1, S2 normal. No murmurs, rubs, or gallops.  ABDOMEN: Soft,  nontender, nondistended. Bowel sounds present. No organomegaly or mass.  EXTREMITIES: No cyanosis, clubbing or edema b/l.    NEUROLOGIC: right side weakness--chronic PSYCHIATRIC:  patient is alert and awake SKIN:  Pressure Injury 04/29/21 Buttocks Right;Left Stage 2 -  Partial thickness loss of dermis presenting as a shallow open injury with a red, pink wound bed without slough. (Active)  04/29/21 2100  Location: Buttocks  Location Orientation: Right;Left  Staging: Stage 2 -  Partial thickness loss of dermis presenting as a shallow open injury with a red, pink wound bed without slough.  Wound Description (Comments):   Present on Admission: Yes        LABORATORY PANEL:  CBC Recent Labs  Lab 04/29/21 0700  WBC 29.8*  HGB 13.0  HCT 38.5*  PLT 463*     Chemistries  Recent Labs  Lab 04/28/21 2056 04/29/21 0700  NA 130* 133*  K 5.0 4.6  CL 94* 94*  CO2 26 26  GLUCOSE 148* 135*  BUN 23 22  CREATININE 1.91* 1.93*  CALCIUM 9.4 9.3  AST 20  --   ALT 12  --   ALKPHOS 86  --   BILITOT 0.9  --     Cardiac Enzymes No results for input(s): TROPONINI in the last 168 hours. RADIOLOGY:  DG Chest Portable 1 View  Result Date: 04/28/2021 CLINICAL DATA:  Altered level of consciousness, fever EXAM: PORTABLE CHEST 1 VIEW COMPARISON:  09/19/2018 FINDINGS: Single frontal view of the chest demonstrates a stable cardiac silhouette. There is mild central vascular congestion without airspace disease, effusion, or pneumothorax.  No acute bony abnormalities. IMPRESSION: 1. Mild chronic central vascular congestion. No acute airspace disease. Electronically Signed   By: Sharlet Salina M.D.   On: 04/28/2021 20:11   ASSESSMENT AND PLAN:  [name] is a 80 y.o. male with medical history significant for hypothyroid, insomnia, OSA, atrial fibrillation on Eliquis, non-insulin-dependent diabetes mellitus, presents to the emergency department for chief concerns of confusion and increased  agitation.   Metabolic encephalopathy suspected due to UTI/sacral wound chronic POA -- patient has had a history of urethral structure and used to do In-N-Out. Currently per wife feels he is urinating well however sometimes does not get the urge to pee -- IV fluids -- IV Rocephin -- follow-up blood culture and urine culture so far neg --wbc 29K. Pt clinically doing well.  --sacral wound per description looks ok.  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 a -- recieved IV fluids -- monitor input-output  obstructive sleep apnea -- noncompliant CPAP  diabetes type II with renal manifestation -- sliding scale insulin  history of a fib with RVR -- continue amiodarone, metoprolol and eliquis  Hyperlipidemia -- continue Zetia  Hypothyroidism continue Synthroid  Procedures: Family communication : son and wife on 8/27 Consults : CODE STATUS: full DVT Prophylaxis : eliquis Level of care: Med-Surg Status is: Inpatient  Remains inpatient appropriate because:Inpatient level of care appropriate due to severity of illness  Dispo: The patient is from: Home              Anticipated d/c is to: Home              Patient currently is not medically stable to d/c.   Difficult to place patient No        TOTAL TIME TAKING CARE OF THIS PATIENT: 25 minutes.  >50% time spent on counselling and coordination of care  Note: This dictation was prepared with Dragon dictation along with smaller phrase technology. Any transcriptional errors that result from this process are unintentional.  Enedina Finner M.D    [facility]   CC: Primary care physician; Lynnea Ferrier, MD Patient ID: Darren Frazier, male   DOB: May 23, 1941, 80 y.o.   MRN: [id number]

## 2021-04-30 NOTE — Progress Notes (Signed)
Pt seen for niv usage. Pt states to not use @ home, nor does he want to use niv while in hospital. Unit @ bedside pt made aware to call if he chooses to use. Pt left in nard.

## 2021-05-01 DIAGNOSIS — L89150 Pressure ulcer of sacral region, unstageable: Secondary | ICD-10-CM | POA: Diagnosis not present

## 2021-05-01 DIAGNOSIS — E785 Hyperlipidemia, unspecified: Secondary | ICD-10-CM | POA: Diagnosis not present

## 2021-05-01 DIAGNOSIS — I4821 Permanent atrial fibrillation: Secondary | ICD-10-CM | POA: Diagnosis not present

## 2021-05-01 DIAGNOSIS — R4182 Altered mental status, unspecified: Secondary | ICD-10-CM | POA: Diagnosis not present

## 2021-05-01 LAB — CBC
HCT: 38 % — ABNORMAL LOW (ref 39.0–52.0)
Hemoglobin: 12.8 g/dL — ABNORMAL LOW (ref 13.0–17.0)
MCH: 32.1 pg (ref 26.0–34.0)
MCHC: 33.7 g/dL (ref 30.0–36.0)
MCV: 95.2 fL (ref 80.0–100.0)
Platelets: 361 10*3/uL (ref 150–400)
RBC: 3.99 MIL/uL — ABNORMAL LOW (ref 4.22–5.81)
RDW: 14.6 % (ref 11.5–15.5)
WBC: 22.4 10*3/uL — ABNORMAL HIGH (ref 4.0–10.5)
nRBC: 0 % (ref 0.0–0.2)

## 2021-05-01 LAB — GLUCOSE, CAPILLARY: Glucose-Capillary: 106 mg/dL — ABNORMAL HIGH (ref 70–99)

## 2021-05-01 MED ORDER — CEPHALEXIN 500 MG PO CAPS: 500.0000 mg | ORAL_CAPSULE | Freq: Three times a day (TID) | ORAL | 0 refills | Status: AC

## 2021-05-01 MED ORDER — CEPHALEXIN 500 MG PO CAPS
500.0000 mg | ORAL_CAPSULE | Freq: Three times a day (TID) | ORAL | Status: DC
  Administered 2021-05-01: 09:00:00 500 mg via ORAL
  Filled 2021-05-01: qty 1

## 2021-05-01 NOTE — Discharge Summary (Signed)
[facility] - [place] at [facility]   PATIENT NAME: Darren Frazier    MR#:  [id number]  DATE OF BIRTH:  1941-01-10  DATE OF ADMISSION:  04/28/2021 ADMITTING PHYSICIAN: Enedina FinnerSona Debar Plate, MD  DATE OF DISCHARGE: 05/01/2021  PRIMARY CARE PHYSICIAN: Lynnea FerrierKlein, Bert J III, MD    ADMISSION DIAGNOSIS:  Altered mental status [R41.82] Urinary tract infection without hematuria, site unspecified [N39.0] AMS (altered mental status) [R41.82]  DISCHARGE DIAGNOSIS:    SECONDARY DIAGNOSIS:   Past Medical History:  Diagnosis Date  . ARF (acute respiratory failure) (HCC)   . Cardiomyopathy (HCC)   . Diabetes mellitus without complication (HCC)   . Dysphagia   . Dysrhythmia    atrial fibrillation  . Encephalopathy   . GERD (gastroesophageal reflux disease)   . H/O: urethral stricture    self caths occasionally  . Heart failure, unspecified (HCC)   . Hemiplegia affecting right dominant side (HCC)   . History of abdominal aortic aneurysm (AAA)    30/5734mm in diameter, stable  . History of kidney stones 2017  . Hyperlipidemia   . Hypertension   . Hypothyroidism   . Morbid obesity with BMI of 40.0-44.9, adult (HCC) 2019  . Peripheral vascular disease (HCC) 2019   lymphadema both extremities, ulceration on left big toe  . Sleep apnea   . Stroke (HCC) 2011   x 2 within 6 months. some numbness over upper arms, balance off  . Tremors of nervous system   . Vitamin D deficiency     HOSPITAL COURSE:  Darren Frazier is a 80 y.o. male with medical history significant for hypothyroid, insomnia, OSA, atrial fibrillation on Eliquis, non-insulin-dependent diabetes mellitus, presents to the emergency department for chief concerns of confusion and increased agitation.   Metabolic encephalopathy suspected due to UTI/?sacral wound chronic POA -- patient has had a history of urethral structure and used to do In-N-Out. Currently per wife feels he is urinating well however sometimes does not  get the urge to pee --received  IV fluids -- IV Rocephin--change to po keflex -- follow-up blood culture and urine culture so far neg --wbc 29K--trending down to 22K. Remains afebrile Pt clinically doing well. No other source of infection identified. Patient tolerating PO as well. No fever. 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 a -- recieved IV fluids -- monitor input-output   obstructive sleep apnea -- noncompliant CPAP   diabetes type II with renal manifestation -- sliding scale insulin -- resume home meds   history of a fib with RVR -- continue amiodarone, metoprolol and eliquis   Hyperlipidemia -- continue Zetia   Hypothyroidism continue Synthroid   Procedures: Family communication : discussed with wife Lupita LeashDonna on the phone Consults : CODE STATUS: full DVT Prophylaxis : eliquis Level of care: Med-Surg Status is: Inpatient     Dispo: The patient is from: Home              Anticipated d/c is to: Home              Patient currently is improving and medically optimized for discharge             difficult to place patient No   patient over all hemodynamically stable. His requesting to go home. Discussed with wife and in agreement with plan. Patient will finish antibiotic as outpatient follow-up with PCP. Wife is recommended to bring to the ER if sign symptoms change. CONSULTS OBTAINED:  DRUG ALLERGIES:   Allergies  Allergen Reactions  . Ace Inhibitors Other (See Comments)    unknown  . Omeprazole Diarrhea  . Statins Other (See Comments)    Abnormal lab results(pt unsure of what labs)    DISCHARGE MEDICATIONS:   Allergies as of 05/01/2021       Reactions   Ace Inhibitors Other (See Comments)   unknown   Omeprazole Diarrhea   Statins Other (See Comments)   Abnormal lab results(pt unsure of what labs)        Medication List     STOP taking these medications    amoxicillin 875 MG tablet Commonly known  as: AMOXIL       TAKE these medications    albuterol (2.5 MG/3ML) 0.083% nebulizer solution Commonly known as: PROVENTIL Take 3 mLs (2.5 mg total) by nebulization every 6 (six) hours as needed for wheezing or shortness of breath.   amiodarone 200 MG tablet Commonly known as: PACERONE Take 1 tablet (200 mg total) by mouth daily.   apixaban 5 MG Tabs tablet Commonly known as: ELIQUIS Take 1 tablet (5 mg total) by mouth 2 (two) times daily.   cephALEXin 500 MG capsule Commonly known as: KEFLEX Take 1 capsule (500 mg total) by mouth every 8 (eight) hours for 12 doses.   ezetimibe 10 MG tablet Commonly known as: ZETIA Take 1 tablet (10 mg total) by mouth daily.   Fish Oil 1000 MG Caps Take 1,000 mg by mouth 3 (three) times daily.   ketoconazole 2 % cream Commonly known as: NIZORAL Apply topically 2 (two) times daily as needed.   levothyroxine 150 MCG tablet Commonly known as: SYNTHROID Take 150 mcg by mouth daily.   metFORMIN 500 MG tablet Commonly known as: GLUCOPHAGE Take 500 mg by mouth 2 (two) times daily.   metoprolol succinate 50 MG 24 hr tablet Commonly known as: TOPROL-XL Take 50-75 mg by mouth 2 (two) times daily. 75 mg qam 50 qhs   multivitamin with minerals Tabs tablet Take 1 tablet by mouth daily.   QUEtiapine 25 MG tablet Commonly known as: SEROQUEL Take 25 mg by mouth See admin instructions. Take with 50 mg to equal 75 mg at bedtime What changed: Another medication with the same name was changed. Make sure you understand how and when to take each.   QUEtiapine 50 MG tablet Commonly known as: SEROQUEL Take 1.5 tablets (75 mg total) by mouth at bedtime. What changed:  how much to take when to take this additional instructions   senna-docusate 8.6-50 MG tablet Commonly known as: Senokot-S Take 1 tablet by mouth at bedtime as needed for mild constipation.   vitamin C 500 MG tablet Commonly known as: ASCORBIC ACID Take 500 mg by mouth daily.    Vitamin D3 25 MCG (1000 UT) Caps Take 1,000 Units by mouth daily.               Discharge Care Instructions  (From admission, onward)           Start     Ordered   05/01/21 0000  Discharge wound care:       Comments: Wound care to stage 2 area of skin loss to buttocks:  Cleanse with NS, pat dry. Cover with xeroform gauze [place] # [id number]), cover with dry gauze and top with silicone foam bordered dressing. Change xeroform once daily, may reuse silicone foam for up to 3 days. Change PRN for rolling of dressing edges or soiling. Turn patient  side to side and minimize time in the supine position.   05/01/21 0836            If you experience worsening of your admission symptoms, develop shortness of breath, life threatening emergency, suicidal or homicidal thoughts you must seek medical attention immediately by calling 911 or calling your MD immediately  if symptoms less severe.  You Must read complete instructions/literature along with all the possible adverse reactions/side effects for all the Medicines you take and that have been prescribed to you. Take any new Medicines after you have completely understood and accept all the possible adverse reactions/side effects.   Please note  You were cared for by a hospitalist during your hospital stay. If you have any questions about your discharge medications or the care you received while you were in the hospital after you are discharged, you can call the unit and asked to speak with the hospitalist on call if the hospitalist that took care of you is not available. Once you are discharged, your primary care physician will handle any further medical issues. Please note that NO REFILLS for any discharge medications will be authorized once you are discharged, as it is imperative that you return to your primary care physician (or establish a relationship with a primary care physician if you do not have one) for your aftercare needs so that they  can reassess your need for medications and monitor your lab values. Today   SUBJECTIVE   No new complaints  VITAL SIGNS:  Blood pressure 140/68, pulse 91, temperature 98.2 F (36.8 C), resp. rate 20, height 5\' 9"  (1.753 m), weight 117 kg, SpO2 99 %.  I/O:  No intake or output data in the 24 hours ending 05/01/21 0837  PHYSICAL EXAMINATION:  GENERAL:  80 y.o.-year-old patient lying in the bed with no acute distress.  LUNGS: Normal breath sounds bilaterally, no wheezing, rales,rhonchi or crepitation. No use of accessory muscles of respiration.  CARDIOVASCULAR: S1, S2 normal. No murmurs, rubs, or gallops.  ABDOMEN: Soft, non-tender, non-distended. Bowel sounds present. No organomegaly or mass.  EXTREMITIES: No pedal edema, cyanosis, or clubbing.  NEUROLOGIC: chronic right side weakness PSYCHIATRIC: The patient is alert and awake.  SKIN:    DATA REVIEW:   CBC  Recent Labs  Lab 05/01/21 0658  WBC 22.4*  HGB 12.8*  HCT 38.0*  PLT 361    Chemistries  Recent Labs  Lab 04/28/21 2056 04/29/21 0700  NA 130* 133*  K 5.0 4.6  CL 94* 94*  CO2 26 26  GLUCOSE 148* 135*  BUN 23 22  CREATININE 1.91* 1.93*  CALCIUM 9.4 9.3  AST 20  --   ALT 12  --   ALKPHOS 86  --   BILITOT 0.9  --     Microbiology Results   Recent Results (from the past 240 hour(s))  Urine Culture     Status: None   Collection Time: 04/28/21  7:50 PM   Specimen: Urine, Random  Result Value Ref Range Status   Specimen Description   Final    URINE, RANDOM Performed at [facility], [address], [place], [place] [address]    Special Requests   Final    NONE Performed at [facility], [address], [place], [place] [address]    Culture   Final    NO GROWTH Performed at [facility], [address] [place], [place] [address]    Report Status 04/30/2021 FINAL  Final  Resp Panel by RT-PCR (Flu A&B, Covid) Urine, Catheterized     Status: None   Collection Time:  04/28/21  8:56 PM   Specimen: Urine, Catheterized; Nasopharyngeal(NP) swabs in vial transport medium  Result Value Ref Range Status   SARS Coronavirus 2 by RT PCR NEGATIVE NEGATIVE Final    Comment: (NOTE) SARS-CoV-2 target nucleic acids are NOT DETECTED.  The SARS-CoV-2 RNA is generally detectable in upper respiratory specimens during the acute phase of infection. The lowest concentration of SARS-CoV-2 viral copies this assay can detect is 138 copies/mL. A negative result does not preclude SARS-Cov-2 infection and should not be used as the sole basis for treatment or other patient management decisions. A negative result may occur with  improper specimen collection/handling, submission of specimen other than nasopharyngeal swab, presence of viral mutation(s) within the areas targeted by this assay, and inadequate number of viral copies(<138 copies/mL). A negative result must be combined with clinical observations, patient history, and epidemiological information. The expected result is Negative.  Fact Sheet for Patients:  BloggerCourse.com  Fact Sheet for Healthcare Providers:  SeriousBroker.it  This test is no t yet approved or cleared by the [place] FDA and  has been authorized for detection and/or diagnosis of SARS-CoV-2 by FDA under an Emergency Use Authorization (EUA). This EUA will remain  in effect (meaning this test can be used) for the duration of the COVID-19 declaration under Section 564(b)(1) of the Act, 21 U.S.C.section 360bbb-3(b)(1), unless the authorization is terminated  or revoked sooner.       Influenza A by PCR NEGATIVE NEGATIVE Final   Influenza B by PCR NEGATIVE NEGATIVE Final    Comment: (NOTE) The Xpert Xpress SARS-CoV-2/FLU/RSV plus assay is intended as an aid in the diagnosis of influenza from Nasopharyngeal swab specimens and should not be used as a sole basis for treatment. Nasal washings  and aspirates are unacceptable for Xpert Xpress SARS-CoV-2/FLU/RSV testing.  Fact Sheet for Patients: BloggerCourse.com  Fact Sheet for Healthcare Providers: SeriousBroker.it  This test is not yet approved or cleared by the [place] FDA and has been authorized for detection and/or diagnosis of SARS-CoV-2 by FDA under an Emergency Use Authorization (EUA). This EUA will remain in effect (meaning this test can be used) for the duration of the COVID-19 declaration under Section 564(b)(1) of the Act, 21 U.S.C. section 360bbb-3(b)(1), unless the authorization is terminated or revoked.  Performed at [facility], [address] [place], [place] [address]   CULTURE, BLOOD (ROUTINE X 2) w Reflex to ID Panel     Status: None (Preliminary result)   Collection Time: 04/28/21 11:02 PM   Specimen: BLOOD  Result Value Ref Range Status   Specimen Description BLOOD BLOOD RIGHT ARM  Final   Special Requests   Final    BOTTLES DRAWN AEROBIC AND ANAEROBIC Blood Culture adequate volume   Culture   Final    NO GROWTH 3 DAYS Performed at [facility], [address] [place], [place] [address]    Report Status PENDING  Incomplete  CULTURE, BLOOD (ROUTINE X 2) w Reflex to ID Panel     Status: None (Preliminary result)   Collection Time: 04/28/21 11:02 PM   Specimen: BLOOD  Result Value Ref Range Status   Specimen Description BLOOD BLOOD RIGHT ARM  Final   Special Requests   Final    BOTTLES DRAWN AEROBIC AND ANAEROBIC Blood Culture adequate volume   Culture   Final    NO GROWTH 3  DAYS Performed at [facility], [address], [place], [place] [address]    Report Status PENDING  Incomplete    RADIOLOGY:  No results found.   CODE STATUS:     Code Status Orders  (From admission, onward)           Start     Ordered   04/28/21 2248  Full code  Continuous        04/28/21 2248           Code  Status History     Date Active Date Inactive Code Status Order ID Comments User Context   11/26/2018 1239 11/28/2018 1502 Full Code [id number]  Renford Dills, MD Inpatient   09/17/2018 2209 09/30/2018 2032 Full Code [id number]  Norton Blizzard, NP ED   08/27/2018 2023 09/17/2018 1912 Full Code [id number]  Julieanne Cotton, MD Inpatient   08/27/2018 1726 08/27/2018 2023 Full Code [id number]  Milon Dikes, MD Inpatient   08/27/2018 1428 08/27/2018 1717 Full Code [id number]  Katha Hamming, MD ED   03/05/2018 0928 03/05/2018 1455 Full Code [id number]  Schnier, [name], MD Inpatient        TOTAL TIME TAKING CARE OF THIS PATIENT: 40 minutes.    Enedina Finner M.D  [facility]    CC: Primary care physician; Lynnea Ferrier, MD

## 2021-05-01 NOTE — Clinical Social Work Note (Signed)
Patient has orders to discharge home today. Per RN, needs EMS transport home. Confirmed with patient and wife. Address on facesheet is complete. EMS transport has been arranged and he is first on the list.  Charlynn Court, CSW [phone]

## 2021-05-01 NOTE — Progress Notes (Signed)
Patient discharged home via EMS. IV removed and patient belongings sent home. Discharge teachings provided to patient as well as patients wife, Lupita Leash, via phone.

## 2021-05-01 NOTE — Discharge Instructions (Signed)
Use Prevalon boots while in bed

## 2021-05-03 LAB — CULTURE, BLOOD (ROUTINE X 2)
Culture: NO GROWTH
Culture: NO GROWTH
Special Requests: ADEQUATE
Special Requests: ADEQUATE

## 2021-05-04 LAB — VITAMIN B1: Vitamin B1 (Thiamine): 236.4 nmol/L — ABNORMAL HIGH (ref 66.5–200.0)

## 2021-05-24 ENCOUNTER — Encounter (INDEPENDENT_AMBULATORY_CARE_PROVIDER_SITE_OTHER): Payer: Medicare HMO

## 2021-05-24 ENCOUNTER — Ambulatory Visit (INDEPENDENT_AMBULATORY_CARE_PROVIDER_SITE_OTHER): Payer: Medicare HMO | Admitting: Nurse Practitioner

## 2021-06-03 DEATH — deceased
